# Patient Record
Sex: Female | Born: 1970
Health system: Southern US, Community
[De-identification: ages and names within clinical notes are randomized; demographics above are authoritative.]

## PROBLEM LIST (undated history)

## (undated) ENCOUNTER — Ambulatory Visit

## (undated) ENCOUNTER — Telehealth

## (undated) ENCOUNTER — Encounter: Attending: Gynecologic Oncology | Primary: Gynecologic Oncology

## (undated) ENCOUNTER — Encounter

## (undated) ENCOUNTER — Inpatient Hospital Stay

## (undated) ENCOUNTER — Encounter: Attending: Registered" | Primary: Registered"

## (undated) ENCOUNTER — Ambulatory Visit: Payer: PRIVATE HEALTH INSURANCE

## (undated) ENCOUNTER — Ambulatory Visit: Attending: Gynecologic Oncology | Primary: Gynecologic Oncology

## (undated) ENCOUNTER — Encounter
Attending: Student in an Organized Health Care Education/Training Program | Primary: Student in an Organized Health Care Education/Training Program

## (undated) DIAGNOSIS — M545 Low back pain, unspecified: Secondary | ICD-10-CM

## (undated) DIAGNOSIS — R112 Nausea with vomiting, unspecified: Secondary | ICD-10-CM

## (undated) DIAGNOSIS — Z87442 Personal history of urinary calculi: Secondary | ICD-10-CM

## (undated) DIAGNOSIS — K219 Gastro-esophageal reflux disease without esophagitis: Secondary | ICD-10-CM

## (undated) DIAGNOSIS — Z9889 Other specified postprocedural states: Secondary | ICD-10-CM

## (undated) DIAGNOSIS — O24419 Gestational diabetes mellitus in pregnancy, unspecified control: Secondary | ICD-10-CM

## (undated) DIAGNOSIS — I491 Atrial premature depolarization: Secondary | ICD-10-CM

## (undated) DIAGNOSIS — N200 Calculus of kidney: Secondary | ICD-10-CM

## (undated) DIAGNOSIS — M542 Cervicalgia: Secondary | ICD-10-CM

## (undated) DIAGNOSIS — M1990 Unspecified osteoarthritis, unspecified site: Secondary | ICD-10-CM

## (undated) DIAGNOSIS — Z803 Family history of malignant neoplasm of breast: Secondary | ICD-10-CM

## (undated) DIAGNOSIS — F411 Generalized anxiety disorder: Secondary | ICD-10-CM

## (undated) HISTORY — DX: Unspecified osteoarthritis, unspecified site: M19.90

## (undated) HISTORY — DX: Family history of malignant neoplasm of breast: Z80.3

## (undated) HISTORY — DX: Calculus of kidney: N20.0

## (undated) HISTORY — DX: Cervicalgia: M54.2

## (undated) HISTORY — DX: Low back pain, unspecified: M54.50

## (undated) HISTORY — PX: TONSILLECTOMY: SUR1361

## (undated) HISTORY — DX: Gastro-esophageal reflux disease without esophagitis: K21.9

## (undated) HISTORY — DX: Atrial premature depolarization: I49.1

## (undated) HISTORY — DX: Generalized anxiety disorder: F41.1

## (undated) HISTORY — DX: Gestational diabetes mellitus in pregnancy, unspecified control: O24.419

## (undated) MED FILL — Acetaminophen Tab 325 MG: ORAL | Qty: 2 | Status: AC

---
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
## 1998-04-08 ENCOUNTER — Emergency Department (HOSPITAL_COMMUNITY): Admission: EM | Admit: 1998-04-08 | Discharge: 1998-04-08 | Payer: Self-pay | Admitting: Internal Medicine

## 1999-05-05 ENCOUNTER — Other Ambulatory Visit: Admission: RE | Admit: 1999-05-05 | Discharge: 1999-05-05 | Payer: Self-pay | Admitting: Obstetrics and Gynecology

## 1999-10-22 ENCOUNTER — Emergency Department (HOSPITAL_COMMUNITY): Admission: EM | Admit: 1999-10-22 | Discharge: 1999-10-23 | Payer: Self-pay | Admitting: Emergency Medicine

## 1999-10-23 ENCOUNTER — Encounter: Payer: Self-pay | Admitting: Emergency Medicine

## 2000-05-05 ENCOUNTER — Other Ambulatory Visit: Admission: RE | Admit: 2000-05-05 | Discharge: 2000-05-05 | Payer: Self-pay | Admitting: Obstetrics and Gynecology

## 2001-02-21 ENCOUNTER — Encounter: Payer: Self-pay | Admitting: Obstetrics and Gynecology

## 2001-02-21 ENCOUNTER — Ambulatory Visit (HOSPITAL_COMMUNITY): Admission: RE | Admit: 2001-02-21 | Discharge: 2001-02-21 | Payer: Self-pay | Admitting: Obstetrics and Gynecology

## 2001-03-19 ENCOUNTER — Other Ambulatory Visit: Admission: RE | Admit: 2001-03-19 | Discharge: 2001-03-19 | Payer: Self-pay | Admitting: Obstetrics & Gynecology

## 2001-04-16 ENCOUNTER — Other Ambulatory Visit: Admission: RE | Admit: 2001-04-16 | Discharge: 2001-04-16 | Payer: Self-pay | Admitting: Obstetrics & Gynecology

## 2001-11-05 ENCOUNTER — Inpatient Hospital Stay (HOSPITAL_COMMUNITY): Admission: AD | Admit: 2001-11-05 | Discharge: 2001-11-07 | Payer: Self-pay | Admitting: Obstetrics and Gynecology

## 2001-12-17 ENCOUNTER — Other Ambulatory Visit: Admission: RE | Admit: 2001-12-17 | Discharge: 2001-12-17 | Payer: Self-pay | Admitting: Obstetrics and Gynecology

## 2002-12-31 ENCOUNTER — Ambulatory Visit (HOSPITAL_COMMUNITY): Admission: RE | Admit: 2002-12-31 | Discharge: 2002-12-31 | Payer: Self-pay | Admitting: Obstetrics and Gynecology

## 2003-01-14 ENCOUNTER — Other Ambulatory Visit: Admission: RE | Admit: 2003-01-14 | Discharge: 2003-01-14 | Payer: Self-pay | Admitting: Obstetrics and Gynecology

## 2003-10-21 ENCOUNTER — Inpatient Hospital Stay (HOSPITAL_COMMUNITY): Admission: AD | Admit: 2003-10-21 | Discharge: 2003-10-21 | Payer: Self-pay | Admitting: Obstetrics and Gynecology

## 2003-12-02 ENCOUNTER — Inpatient Hospital Stay (HOSPITAL_COMMUNITY): Admission: AD | Admit: 2003-12-02 | Discharge: 2003-12-04 | Payer: Self-pay | Admitting: Obstetrics and Gynecology

## 2005-09-14 ENCOUNTER — Ambulatory Visit (HOSPITAL_COMMUNITY): Admission: RE | Admit: 2005-09-14 | Discharge: 2005-09-14 | Payer: Self-pay | Admitting: *Deleted

## 2005-09-14 ENCOUNTER — Encounter: Payer: Self-pay | Admitting: *Deleted

## 2005-09-29 ENCOUNTER — Ambulatory Visit (HOSPITAL_COMMUNITY): Admission: RE | Admit: 2005-09-29 | Discharge: 2005-09-29 | Payer: Self-pay | Admitting: Gastroenterology

## 2005-10-13 ENCOUNTER — Inpatient Hospital Stay (HOSPITAL_COMMUNITY): Admission: AD | Admit: 2005-10-13 | Discharge: 2005-10-13 | Payer: Self-pay | Admitting: *Deleted

## 2005-10-14 ENCOUNTER — Observation Stay (HOSPITAL_COMMUNITY): Admission: AD | Admit: 2005-10-14 | Discharge: 2005-10-15 | Payer: Self-pay | Admitting: Obstetrics & Gynecology

## 2005-10-16 ENCOUNTER — Inpatient Hospital Stay (HOSPITAL_COMMUNITY): Admission: AD | Admit: 2005-10-16 | Discharge: 2005-10-16 | Payer: Self-pay | Admitting: Obstetrics & Gynecology

## 2005-10-19 ENCOUNTER — Inpatient Hospital Stay (HOSPITAL_COMMUNITY): Admission: AD | Admit: 2005-10-19 | Discharge: 2005-10-19 | Payer: Self-pay | Admitting: Obstetrics and Gynecology

## 2005-10-26 ENCOUNTER — Inpatient Hospital Stay (HOSPITAL_COMMUNITY): Admission: AD | Admit: 2005-10-26 | Discharge: 2005-10-26 | Payer: Self-pay | Admitting: Obstetrics and Gynecology

## 2005-11-02 ENCOUNTER — Inpatient Hospital Stay: Admission: AD | Admit: 2005-11-02 | Discharge: 2005-11-02 | Payer: Self-pay | Admitting: Obstetrics and Gynecology

## 2005-11-09 ENCOUNTER — Inpatient Hospital Stay (HOSPITAL_COMMUNITY): Admission: AD | Admit: 2005-11-09 | Discharge: 2005-11-09 | Payer: Self-pay | Admitting: Obstetrics and Gynecology

## 2006-02-23 ENCOUNTER — Ambulatory Visit (HOSPITAL_COMMUNITY): Admission: RE | Admit: 2006-02-23 | Discharge: 2006-02-23 | Payer: Self-pay | Admitting: Obstetrics and Gynecology

## 2006-03-06 ENCOUNTER — Ambulatory Visit (HOSPITAL_COMMUNITY): Admission: RE | Admit: 2006-03-06 | Discharge: 2006-03-06 | Payer: Self-pay | Admitting: Obstetrics and Gynecology

## 2006-03-06 ENCOUNTER — Encounter (INDEPENDENT_AMBULATORY_CARE_PROVIDER_SITE_OTHER): Payer: Self-pay | Admitting: Specialist

## 2006-09-14 ENCOUNTER — Ambulatory Visit (HOSPITAL_COMMUNITY): Admission: RE | Admit: 2006-09-14 | Discharge: 2006-09-14 | Payer: Self-pay | Admitting: *Deleted

## 2007-02-15 HISTORY — PX: SALPINGECTOMY: SHX328

## 2007-02-15 HISTORY — PX: TUBAL LIGATION: SHX77

## 2007-02-15 HISTORY — PX: ABDOMINAL EXPLORATION SURGERY: SHX538

## 2007-02-20 ENCOUNTER — Encounter: Admission: RE | Admit: 2007-02-20 | Discharge: 2007-02-20 | Payer: Self-pay | Admitting: Obstetrics and Gynecology

## 2007-04-13 ENCOUNTER — Inpatient Hospital Stay (HOSPITAL_COMMUNITY): Admission: AD | Admit: 2007-04-13 | Discharge: 2007-04-15 | Payer: Self-pay | Admitting: Obstetrics and Gynecology

## 2007-06-13 ENCOUNTER — Encounter (INDEPENDENT_AMBULATORY_CARE_PROVIDER_SITE_OTHER): Payer: Self-pay | Admitting: Obstetrics and Gynecology

## 2007-06-13 ENCOUNTER — Ambulatory Visit (HOSPITAL_COMMUNITY): Admission: RE | Admit: 2007-06-13 | Discharge: 2007-06-13 | Payer: Self-pay | Admitting: Obstetrics and Gynecology

## 2010-01-20 ENCOUNTER — Ambulatory Visit: Payer: Self-pay | Admitting: Family Medicine

## 2010-01-20 DIAGNOSIS — M928 Other specified juvenile osteochondrosis: Secondary | ICD-10-CM

## 2010-01-20 DIAGNOSIS — M549 Dorsalgia, unspecified: Secondary | ICD-10-CM

## 2010-01-20 DIAGNOSIS — M76899 Other specified enthesopathies of unspecified lower limb, excluding foot: Secondary | ICD-10-CM

## 2010-01-20 DIAGNOSIS — M999 Biomechanical lesion, unspecified: Secondary | ICD-10-CM | POA: Insufficient documentation

## 2010-03-18 NOTE — Assessment & Plan Note (Signed)
Summary: np/adjustment,df   Vital Signs:  Patient profile:   40 year old female Height:      60 inches Weight:      108.3 pounds BMI:     21.23 Temp:     98.5 degrees F oral Pulse rate:   108 / minute BP sitting:   114 / 73  (left arm) Cuff size:   regular  Vitals Entered By: Garen Grams LPN (January 20, 2010 11:06 AM) CC: New Patient Is Patient Diabetic? No Pain Assessment Patient in pain? yes     Location: lower back   Primary Care Provider:  Antoine Primas DO  CC:  New Patient.  History of Present Illness: 40 yo female here to establish care.  Pt will continue to go to her gynecologist for pap smears.   Pt is in good health and only has a couple complaints.   1.  Back pain-  Pt notices it at an end of a long day or if she works multiple days in a row.  Pt states some pain radiates down right leg, stays above knee mild numbness in foot sometimes, leg never gives out on her described as an ache in character.  Pt denies any trauma to area and the pain usually gets better. Denies any bowel or bladder problems.  Would be interested in OMT.   2.  hyperglycemia-  Pt states she has always been borderline diabetic since last pregnancy but was 112 fasting last time she had it check approx 1 month ago.   Habits & Providers  Alcohol-Tobacco-Diet     Tobacco Status: never  Current Medications (verified): 1)  Ibuprofen 600 Mg Tabs (Ibuprofen) .... Take 1 Tab By Mouth Two Times A Day For The Next 5 Days Then As Needed 2)  Cvs Cinnamon 500 Mg Caps (Cinnamon) 3)  Fish Oil 1000 Mg Caps (Omega-3 Fatty Acids) 4)  Vitamin D 400 Unit Caps (Cholecalciferol)  Allergies (verified): No Known Drug Allergies  Past History:  Past Medical History: kidney stone GDM with last pregnancy 2009 endometriosis.   Past Surgical History: Tubal ligation Tonsillectomy ectopic pregnancy resulted in right tube being removed.   Family History: Multiple individuals with heart disease MGM  mi Mom RA at 8 glaucoma  Social History: no smoke  occasional alcohol no illicits lives with husband and 3 kids will (8), Pensions consultant (6), and Ruby (2)Smoking Status:  never  Review of Systems       denies fever, chills, nausea, vomiting, diarrhea or constipation   Physical Exam  General:  Well-developed,well-nourished,in no acute distress; alert,appropriate and cooperative throughout examination, small stature but WNL Eyes:  No corneal or conjunctival inflammation noted. EOMI. Perrla. Mouth:  Oral mucosa and oropharynx without lesions or exudates.  Teeth in good repair. Lungs:  Normal respiratory effort, chest expands symmetrically. Lungs are clear to auscultation, no crackles or wheezes. Heart:  Normal rate and regular rhythm. S1 and S2 normal without gallop, murmur, click, rub or other extra sounds. Abdomen:  Bowel sounds positive,abdomen soft and non-tender without masses, organomegaly or hernias noted. Msk:  No deformity or scoliosis noted of thoracic or lumbar spine.   SLT neg Pearlean Brownie positive on right tender to palpation of greater trochanteric bursae on right no hip pain with internal rotation full ROM OMT findings T7RSright L2RS right Sacrum RS right tight piraformis  standing structuaral showed right shoulder down right hip up  After verbal consent OMT was done with improvement of all findings and improved standing structual  exam .  Pulses:  2+ Extremities:  no edema Neurologic:  CN 2-12 intact Skin:  no rash or suspicious lesion.  Psych:  flat affect.     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  will have pt be seen again in 3-6 weeks if doing well.  Orders: OMT 1-2 [facility] [phone])  Problem # 3:  TROCHANTERIC BURSITIS, RIGHT  (ICD-726.5) will give 5 day course of high dose motrin and see if get improvmement.   Problem # 4:  BACK PAIN, RIGHT (ICD-724.5) MSK in nature, faber shows likely sacro-illiac in nature, will continue to monitor, no imaging needed at this time, pt improved well with manipulation.  Her updated medication list for this problem includes:    Ibuprofen 600 Mg Tabs (Ibuprofen) .[name]... Take 1 tab by mouth two times a day for the next 5 days then as needed  Complete Medication List: 1)  Ibuprofen 600 Mg Tabs (Ibuprofen) .... Take 1 tab by mouth two times a day for the next 5 days then as needed 2)  Cvs Cinnamon 500 Mg Caps (Cinnamon) 3)  Fish Oil 1000 Mg Caps (Omega-3 fatty acids) 4)  Vitamin D 400 Unit Caps (Cholecalciferol) Prescriptions: IBUPROFEN 600 MG TABS (IBUPROFEN) take 1 tab by mouth two times a day for the next 5 days then as needed  #60 x 1   Entered and Authorized by:   Antoine Primas DO   Signed by:   Antoine Primas DO on 01/20/2010   Method used:   Electronically to        [id number] (retail)       [address]       [place], [place]  [address]       Ph: [phone]       Fax: [phone]   RxID:   [id number]    Orders Added: 1)  [facility]- New Level 3 [[address]203] 2)  OMT 1-2 [facility] [27253]

## 2010-03-19 ENCOUNTER — Encounter: Payer: Self-pay | Admitting: *Deleted

## 2010-03-31 ENCOUNTER — Encounter: Payer: Self-pay | Admitting: Family Medicine

## 2010-04-01 ENCOUNTER — Ambulatory Visit (INDEPENDENT_AMBULATORY_CARE_PROVIDER_SITE_OTHER): Payer: BC Managed Care – PPO | Admitting: Family Medicine

## 2010-04-01 ENCOUNTER — Encounter: Payer: Self-pay | Admitting: Family Medicine

## 2010-04-01 DIAGNOSIS — M999 Biomechanical lesion, unspecified: Secondary | ICD-10-CM

## 2010-04-01 DIAGNOSIS — S60559A Superficial foreign body of unspecified hand, initial encounter: Secondary | ICD-10-CM | POA: Insufficient documentation

## 2010-04-01 NOTE — Assessment & Plan Note (Signed)
After verbal consent HVLA on side improved increase ROM decrease pain.

## 2010-04-01 NOTE — Assessment & Plan Note (Signed)
Splinter in left hand under 2nd MTP, removed after verbal consent with toothed tweezers band aid applied, do not anticipate any side effects.

## 2010-04-01 NOTE — Progress Notes (Signed)
  Subjective:    Patient ID: [name], female    DOB: Dec 18, 1970, 40 y.o.   MRN: [id number]  HPI  Pt is here for manipulation doing well some pain in right upper back no injury no swelling still can do all activities of daily living, Pt would like it looked at   Left hand-  Was in garden, may have splinter would like assessment.  No fever, no chills, still do all activities just some pain  Review of Systems     Objective:   Physical Exam Left hand-  Base of 2nd MTP has mild area of erythema with callus formation. No streaking.     OMT Findings: Cervical:C2 rotated and side bent left, C4 rotated and side bent right  Thoracic T7 rotated and side bent right  Elevated right 2nd rib  Lumbar: L2 rotated and side bent right Sacrum:normal      Assessment & Plan:

## 2010-04-22 ENCOUNTER — Ambulatory Visit: Payer: BC Managed Care – PPO | Admitting: Family Medicine

## 2010-06-29 NOTE — Discharge Summary (Signed)
NAMERADIANCE, DEADY NO.:  [id number]   MEDICAL RECORD NO.:  [id number]          PATIENT TYPE:  INP   LOCATION:  9118                          [facility]ILITY:  [facility]   PHYSICIAN:  Lenoard Aden, M.D.DATE OF BIRTH:  [birth year]   DATE OF ADMISSION:  04/13/2007  DATE OF DISCHARGE:  04/15/2007                               DISCHARGE SUMMARY   ADMISSION DIAGNOSES:  Gestational diabetes, history of severe shoulder  dystocia.   DISCHARGE DIAGNOSES:  Gestational diabetes, history of severe shoulder  dystocia.   HOSPITAL COURSE:  The patient was admitted with well-controlled  gestational diabetes at 37 weeks.  Previously, has had 2 complicated  vaginal deliveries with significant shoulder dystocia noted at the time  of both deliveries.  Estimated fetal weight per most recent ultrasound  revealed estimated fetal weight similar to those of previous pregnancy  and the patient is at risk for significant recurrent shoulder dystocia.  She declines amniocentesis.  At this time, decision was made to proceed  with induction risks, benefits, or discussed small risk of fetal  prematurity are noted due to favorable cervix and term gestation in the  interest of preventing previous shoulder dystocia.  Decision was made to  proceed with induction.  The patient declined cesarean section as well.  Given these risks, the patient underwent uncomplicated induction of  labor, had an uncomplicated vaginal delivery, and was discharged to home  on day #1 with normal eating patterns, tolerated regular diet without  difficulty, ambulated without difficulty.  Hemoglobin/hematocrit within  normal limits.  She is to followup in the office in 4-6 weeks.   DISCHARGE MEDICATIONS:  Prenatal vitamins, iron, and over-the-counter  nonsteroidal anti-inflammatory medications.      Lenoard Aden, M.D.  Electronically Signed     RJT/MEDQ  D:  06/03/2007  T:  06/04/2007  Job:  [id number]

## 2010-06-29 NOTE — Op Note (Signed)
NAME:  [name], [name]                ACCOUNT NO.:  [id number]   MEDICAL RECORD NO.:  [id number]          PATIENT TYPE:  AMB   LOCATION:  SDC                           [facility]   PHYSICIAN:  Lenoard Aden, M.D.DATE OF BIRTH:  09/11/1970   DATE OF PROCEDURE:  06/13/2007  DATE OF DISCHARGE:                               OPERATIVE REPORT   PREOPERATIVE DIAGNOSES:  1. Desire for elective sterilization.  2. Right lower quadrant pain.   POSTOPERATIVE DIAGNOSES:  1. Desire for elective sterilization.  2. Right lower quadrant pain.  3. Pelvic endometriosis.   PROCEDURE:  1. Diagnostic laparoscopy.  2. Left tubal interruption with bipolar cautery.  3. Right salpingectomy.  4. Ablation of right ovarian fossa endometriosis.   SURGEON:  Lenoard Aden, MD   ANESTHESIA:  General.   ESTIMATED BLOOD LOSS:  Less than 50 mL.   COMPLICATIONS:  None.   DRAINS:  None.   COUNTS:  Correct.   DISPOSITION:  To recovery in good condition.   SPECIMEN:  Right fallopian tube to pathology.   BRIEF OPERATIVE NOTE:  Being apprised of the risks of anesthesia,  infection, bleeding, injury to abdominal organs, need for repair,  delayed versus immediate complications including bowel and bladder  injury with possible need for repair, inability to cure pelvic pain, the  patient was brought to the operating room and she was administered a  general anesthetic without complications, prepped and draped in the  usual sterile fashion, and fully catheterized until the bladder was  empty.  Hulka tenaculum was placed per vagina.  Infraumbilical incision  made with a scalpel after dilute Marcaine solution placed.  Veress  needle placed.  Opening pressure of -4 noted.  3.5 L CO2 insufflated  without difficulty.  Trocar placed atraumatically.  Pictures taken.  Normal liver, gallbladder bed, normal appendiceal area, normal left and  right tubes and ovaries are noted.  Endometriosis in the right  ovarian  fossa noted.  Normal anterior and posterior cul-de-sac.  Two 5 mm ports  were made, right and left mid lower quadrants under direct visualization  and stab incision.  Dilute Marcaine solution placed.  Left tube traced  out at the fimbriated end.  It is divided using the Gyrus in 3  contiguous portions.  The ampullary-isthmic portion of the tube were  cauterized to resistance of zero and the tube is divided.  Tubal lumens  were visualized.  There is some evidence of pelvic congestion along the  left lateral sidewall.  On the right, the right tube was traced out at  the fimbriated end.  The mesosalpinx was cauterized picking multiple  bites using the Gyrus down to the level of the uterine cornua at which  point it is interrupted and divided and removed in total through the  laparoscopic umbilical port.  At this time, there is endometriosis noted  in the right ovarian fossa which is cauterized using the Gyrus after  identifying the right ureter in the process.  Good hemostasis was noted.  CO2 was released.  Good hemostasis was assured.  Incision closed in  0  Vicryl and Dermabond.  Hulka tenaculum removed from the vagina.  The  patient tolerated this procedure well and was transferred to recovery in  good condition.      Lenoard Aden, M.D.  Electronically Signed     RJT/MEDQ  D:  06/13/2007  T:  06/14/2007  Job:  [id number]

## 2010-06-29 NOTE — H&P (Signed)
NAMESONNET, RIZOR NO.:  [id number]   MEDICAL RECORD NO.:  [id number]          PATIENT TYPE:  INP   LOCATION:  9141                          [facility]ILITY[facility]   PHYSICIAN:  Lenoard Aden, M.D.DATE OF BIRTH:  October 02, 1970   DATE OF ADMISSION:  04/13/2007  DATE OF DISCHARGE:                              HISTORY & PHYSICAL   PRIORITY ADMISSION HISTORY AND PHYSICAL   CHIEF COMPLAINT:  History of gestational diabetes, history of  significant shoulder dystocia, for induction at 37 weeks.   She is a 40 year old, white female, G6, P2-0-3-2, with a history of  vaginal delivery x2, a history of ectopic x1, and a history of SAB x2,  who presents for induction.  She has ALLERGIES TO LATEX and SEPTRA.  Medications are prenatal vitamins.  She has a history of laparoscopy for  endometriosis 2008, history of ectopic pregnancy 2007 and given  Methotrexate, history of ureterolithiasis, and history of  gastroesophageal reflux.  She is a nonsmoker and nondrinker.  Denies  domestic or physical violence.  She has a current history with this  pregnancy of gestational diabetes, which is diet controlled.  Family  history of heart disease, hypertension, thyroid disease, myocardial  infarction, lymphoma, and migraine.  She has an obstetric history as  previously noted.   PHYSICAL EXAMINATION:  GENERAL:  She is a well-developed, well-  nourished, white female in no acute distress.  HEENT:  Normal.  LUNGS:  Clear.  HEART:  Regular rhythm.  ABDOMEN:  Soft, gravid, and nontender, estimated fetal weight of 6.5 to  7 pounds.  PELVIC EXAM:  The cervix is 3 to 4 cm, 80%, vertex at minus 1.  EXTREMITIES:  No cords.  NEUROLOGIC EXAM:  Nonfocal.  SKIN:  Intact.   IMPRESSION:  1. A 37-week intrauterine pregnancy.  2. History of significant shoulder dystocia with estimated fetal      weight similar to previous pregnancies.  3. Gestational diabetes, diet controlled.   PLAN:  Admit,  Pitocin, epidural, anticipate cautious attempts at vaginal  delivery as patient declines a cesarean section delivery at this time.      Lenoard Aden, M.D.  Electronically Signed    RJT/MEDQ  D:  04/13/2007  T:  04/14/2007  Job:  [id number]

## 2010-07-02 NOTE — H&P (Signed)
NAME:  [name], [name] NO.:  [id number]   MEDICAL RECORD NO.:  [id number]          PATIENT TYPE:  MAT   LOCATION:  MATC                          [facility]   PHYSICIAN:  Gerri Spore B. Earlene Plater, M.D.  DATE OF BIRTH:  September 07, 1970   DATE OF ADMISSION:  10/14/2005  DATE OF DISCHARGE:                                HISTORY & PHYSICAL   CHIEF COMPLAINT:  Right lower quadrant pain.   HISTORY OF PRESENT ILLNESS:  A 40 year old white female with known ectopic  pregnancy, status post methotrexate on October 13, 2005, presented this  evening with about 2 hours of sudden right lower quadrant pain, mild  tenesmus, no shoulder pain.  She states the pain is severe.  She has had no  dizziness, orthostasis.  She has continued to have light spotting.  Ultrasound in the office yesterday confirmed ectopic pregnancy with a  quantitative HCG around 2,000 and ectopic diameter 2-to-3-cm with trace  fluid in the pelvis.   PAST MEDICAL HISTORY:  None.   PAST OBSTETRICAL HISTORY:  1. Vaginal delivery x2.  2. D&E for missed abortion.   MEDICATIONS:  Prenatal vitamins.   ALLERGIES:  SEPTRA.   REVIEW OF SYSTEMS:  Otherwise negative.   FAMILY HISTORY:  Noncontributory.   PHYSICAL EXAMINATION:  VITAL SIGNS:  Temperature 97.2, pulse 99,  respirations 24, blood pressure 134/86.  GENERAL:  The patient is alert and oriented and in a moderate amount of  discomfort but not in distress.  ABDOMEN:  Soft and does have moderate right lower quadrant tenderness but no  rebound or guarding.   Ultrasound reviewed in radiology real time shows a 2-to-3-cm right adnexal  mass consistent with ectopic pregnancy.  The right ovary is visualized  separately with a corpus luteum cyst noted.  There is only a trace amount of  free fluid noted in the pelvis.  Uterus and left ovary appear normal, other  than a simple cyst on the left.   LABORATORY:  Hemoglobin is stable at 13 from yesterday.   ASSESSMENT:   Ectopic pregnancy, status post methotrexate, October 13, 2005,  one day ago with increased pain.   Clinically, the patient does not show signs of rupture and ultrasound and  hemoglobin are also suggestive that rupture has not occurred.  Options  discussed of laparoscopy for definitive diagnosis/treatment versus  observation, pain control, and serial hemoglobin.  The patient prefers the  later.   PLAN:  1. We will place her in observation on GYN.  2. IV PCA and keep her NPO.  3. We will follow closely.      Gerri Spore B. Earlene Plater, M.D.  Electronically Signed     WBD/MEDQ  D:  10/14/2005  T:  10/14/2005  Job:  [id number]

## 2010-07-02 NOTE — Op Note (Signed)
NAMEFIONNA, [name]                ACCOUNT NO.:  [id number]   MEDICAL RECORD NO.:  [id number]          PATIENT TYPE:  AMB   LOCATION:  SDC                           [facility]   PHYSICIAN:  Lenoard Aden, M.D.DATE OF BIRTH:  11/05/1970   DATE OF PROCEDURE:  03/06/2006  DATE OF DISCHARGE:                               OPERATIVE REPORT   PREOPERATIVE DIAGNOSIS:  Pelvic pain, right lower quadrant pain, with  history of right ectopic.   POSTOPERATIVE DIAGNOSES:  1. Pelvic adhesions with right peritubal and periovarian adhesions,      adherence of the bowel mesentery to the left adnexa and left cul-de-      sac.  2. Right ovarian endometriosis.   PROCEDURES:  1. Diagnostic laparoscopy.  2. Lysis of adhesions.  3. Ablation of endometriosis.   SURGEON:  Lenoard Aden, M.D.   ASSISTANT:  Chester Holstein. Earlene Plater, M.D.   ANESTHESIA:  General.   ESTIMATED BLOOD LOSS:  Less than 50 mL.   COMPLICATIONS:  None.   DRAINS:  Foley.   COUNTS:  Correct.   Patient to recovery in good condition.   BRIEF OPERATIVE NOTE:  After being apprised of the risks of anesthesia,  infection, bleeding, injury to abdominal organs and need for repair,  delayed versus immediate complications to include bowel or bladder  injury, the patient was brought to the operating room, where she was  administered a general anesthetic without complications, prepped and  draped in the usual sterile fashion, a Foley catheter placed, a Hulka  tenaculum placed per vagina.  Infraumbilical incision made with a  scalpel, Veress needle placed, opening pressure of -2 noted.  A 10 mm  trocar placed without difficulty.  Atraumatic trocar entry visualized,  pictures taken.  Normal liver, gallbladder bed seen.  Normal appendiceal  area, normal uterus, normal anterior cul-de-sac and posterior cul-de-sac  with sigmoid mesentery adherent into the midportion of the left  posterior cul-de-sac, and there are minimal left  periovarian adhesions,  which are lysed sharply with Endoshears after placement of two 5 mm  trocar sites, one in the midline and one in the right lower quadrant,  atraumatically under transillumination.  At this time there are  extensive right peritubal and periovarian adhesions and the right ovary  is adhesed to the cul-de-sac, presumably due to some questionable  endometriosis which is noted upon the right ovary.  The ovarian ligament  is then grasped with an atraumatic grasper and Endoshears are used to  lyse adhesions, periovarian and up around the tubes.  Tubal fimbriae  appear normal.  These adhesions are completely lysed and the  endometriosis which appears in the right ovary, which is lysed using the  monopolar cautery with the Endoshears without difficulty.  Good  hemostasis is noted.  No active bleeding is noted.  The anterior and  posterior cul-de-sac now appear clean of any adhesions.  Pictures are  taken.  All instruments are removed under direct visualization, CO2 is  released.  The patient is awakened and transferred to recovery in good  condition.  Lenoard Aden, M.D.  Electronically Signed     RJT/MEDQ  D:  03/06/2006  T:  03/06/2006  Job:  [id number]

## 2010-07-02 NOTE — H&P (Signed)
[name], [name]                ACCOUNT NO.:  [id number]   MEDICAL RECORD NO.:  [id number]          PATIENT TYPE:  AMB   LOCATION:  SDC                           [facility]   PHYSICIAN:  Lenoard Aden, M.D.DATE OF BIRTH:  [birth year]   DATE OF ADMISSION:  03/05/2006  DATE OF DISCHARGE:                              HISTORY & PHYSICAL   CHIEF COMPLAINT:  Pelvic pain.   HISTORY:  She is a 40 year old white female -- G3, P2; with a history of  an ectopic.  She presents with an HSG post-ectopic, which is consistent  with peritubal periovarian right adhesions, with a history of right  lower quadrant pain.   ALLERGIES:  LATEX, SEPTRA.   MEDICATIONS:  None.   FAMILY HISTORY:  Heart disease, hypertension, hyperthyroidism and  diabetes.   SOCIAL HISTORY:  She is a nonsmoker, nondrinker.  She denies domestic or  physical violence.   PAST MEDICAL HISTORY:  1. History of esophageal reflux.  2. Two miscarriages.  3. Two spontaneous vaginal deliveries.  4. One ectopic treated by methotrexate.  Ectopic was conceived with      the [place] IUD.   PHYSICAL EXAMINATION:  GENERAL:  She is a well developed, well nourished  white female in no acute distress.  VITAL SIGNS:  Blood pressure 140/70, weight 112 pounds.  HEENT:  Normal.  LUNGS:  Clear.  HEART:  Regular rate and rhythm.  ABDOMEN:  Soft and nontender.  GU:  Uterus normal size, shape and contour.  No adnexal masses.  EXTREMITIES:  No cords.  NEUROLOGIC:  Nonfocal.   IMPRESSION:  History of ectopic with right lower quadrant pain and  questionable peritubal periovarian adhesions, by HSG.   PLAN:  Proceed with diagnostic laparoscopy, lysis of adhesions,  chromopertubation.  The risks of anesthesia, infection, bleeding, injury  to abdominal organs and need for repair was discussed.  Delay versus  immediate complications to include bowel and bladder injury noted and  inability to cure pelvic pain discussed.  The patient  acknowledges and  will proceed.      Lenoard Aden, M.D.  Electronically Signed     RJT/MEDQ  D:  03/05/2006  T:  03/05/2006  Job:  [id number]

## 2010-11-05 LAB — RPR: RPR Ser Ql: NONREACTIVE

## 2010-11-05 LAB — CBC
HCT: 36.1
Hemoglobin: 12.6
MCHC: 34.5
Platelets: 228
RBC: 3.85 — ABNORMAL LOW
WBC: 12 — ABNORMAL HIGH

## 2010-11-09 LAB — CBC
Hemoglobin: 14.4
MCHC: 34.6
MCV: 92.1
RBC: 4.51

## 2011-03-23 ENCOUNTER — Ambulatory Visit (INDEPENDENT_AMBULATORY_CARE_PROVIDER_SITE_OTHER): Payer: BC Managed Care – PPO | Admitting: Family Medicine

## 2011-03-23 ENCOUNTER — Encounter: Payer: Self-pay | Admitting: Family Medicine

## 2011-03-23 DIAGNOSIS — Z13220 Encounter for screening for lipoid disorders: Secondary | ICD-10-CM

## 2011-03-23 DIAGNOSIS — R109 Unspecified abdominal pain: Secondary | ICD-10-CM

## 2011-03-23 DIAGNOSIS — M549 Dorsalgia, unspecified: Secondary | ICD-10-CM

## 2011-03-23 LAB — CBC WITH DIFFERENTIAL/PLATELET
Basophils Absolute: 0 10*3/uL (ref 0.0–0.1)
HCT: 42.4 % (ref 36.0–46.0)
Hemoglobin: 14.3 g/dL (ref 12.0–15.0)
Lymphocytes Relative: 21 % (ref 12–46)
Monocytes Absolute: 0.9 10*3/uL (ref 0.1–1.0)
Monocytes Relative: 9 % (ref 3–12)
Neutro Abs: 7.5 10*3/uL (ref 1.7–7.7)
WBC: 10.7 10*3/uL — ABNORMAL HIGH (ref 4.0–10.5)

## 2011-03-23 LAB — COMPREHENSIVE METABOLIC PANEL
CO2: 24 mEq/L (ref 19–32)
Creat: 0.75 mg/dL (ref 0.50–1.10)
Glucose, Bld: 88 mg/dL (ref 70–99)
Total Bilirubin: 0.6 mg/dL (ref 0.3–1.2)

## 2011-03-23 LAB — LDL CHOLESTEROL, DIRECT: Direct LDL: 105 mg/dL — ABNORMAL HIGH

## 2011-03-23 MED ORDER — HYDROCODONE-ACETAMINOPHEN 5-500 MG PO TABS: 1.0000 | ORAL_TABLET | Freq: Three times a day (TID) | ORAL | Status: AC | PRN

## 2011-03-23 NOTE — Patient Instructions (Addendum)
It is so great to see you. We will get an ultrasound of your abdomen. I'm giving you some pain medicine to have on hand. If for some reason his fever unable to eat due to the pain and nausea or vomiting please contact me or go to the emergency room. If it does turn out to be your gallbladder I will send you to surgery. Gallbladder Disease Gallbladder disease (cholecystitis) is an inflammation of your gallbladder. It is usually caused by a build-up of stones (gallstones) or sludge (cholelithiasis) in your gallbladder. The gallbladder is not an essential organ. It is located slightly to the right of center in the belly (abdomen), behind the liver. It stores bile made in the liver. Bile aids in digestion of fats. Gallbladder disease may result in nausea (feeling sick to your stomach), abdominal pain, and jaundice. In severe cases, emergency surgery may be required. The most common type of gallbladder disease is gallstones. They begin as small crystals and slowly grow into stones. Gallstone pain occurs when the bile duct has spasms. The spasms are caused by the stone passing out of the duct. The stone is trying to pass at the same time bile is passing into the small bowel for digestion. The pain usually begins suddenly. It may persist from several minutes to several hours. Infection can occur. Infection can add to discomfort and severity of an acute attack. The pain may be made worse by breathing deeply or by being jarred. There may be fever and tenderness to the touch. In some cases, when gallstones do not move into the bile duct, people have no pain or symptoms. These are called "silent" gallstones. Women are three times more likely to develop gallstones than men. Women who have had several pregnancies are more likely to have gallbladder disease. Physicians sometimes advise removing diseased gallbladders before future pregnancies. Other factors that increase the risk of gallbladder disease are obesity, diets  heavy in fried foods and dairy products, increasing age, prolonged use of medications containing female hormones, and heredity. HOME CARE INSTRUCTIONS   If your physician prescribed an antibiotic, take as directed.   Only take over-the-counter or prescription medicines for pain, discomfort, or fever as directed by your caregiver.   Follow a low fat diet until seen again. (Fat causes the gallbladder to contract.)   Follow-up as instructed. Attacks are almost always recurrent and surgery is usually required for permanent treatment.  SEEK IMMEDIATE MEDICAL CARE IF:   Pain is increasing and not controlled by medications.   The pain moves to another part of your abdomen or to your back. (Right sided pain can be appendicitis and left sided pain in adults can be diverticulitis).   You have a fever.   You develop nausea and vomiting.  Document Released: 01/31/2005 Document Revised: 10/13/2010 Document Reviewed: 09/20/2007 [facility] Patient Information 2012 [place], [place].

## 2011-03-23 NOTE — Progress Notes (Signed)
  Subjective:    Patient ID: [name], female    DOB: 1970/06/30, 41 y.o.   MRN: [id number]  HPI 41 year old female coming in with right upper quadrant abdominal pain/rib pain. Patient has had this now for about 4 days. Patient states that he got bad on Friday worse on Saturday orbital and Sunday better on Monday patient did notice that she had some association with food soma nausea associated with it no injury does hurt with certain movements especially when she bends forward in flexion. Denies any fever chills diarrhea or constipation patient has a history of cholestasis while she was pregnant. No history of gallstones.   Review of Systems As stated above    Objective:   Physical Exam Vitals reviewed General: No apparent distress Cardiovascular: Regular in rhythm no murmur Pulmonary: Clear to auscultation bilaterally Abdomen bowel sounds positive in all 4 quadrants patient does have significant tenderness in the right upper quadrant patient has positive Murphy sign    Assessment & Plan:

## 2011-03-23 NOTE — Assessment & Plan Note (Signed)
Patient clinically fits the potential for gallstones. At this time will get a right upper quadrant ultrasound to look for any signs of gallstone. If positive we'll then send to surgery for evaluation. The patient red flags as well as some instructions on when to seek medical attention as well as changes in her diet that she can make during this time. Patient given a prescription for Vicodin for when pain becomes intolerable.

## 2011-03-24 ENCOUNTER — Ambulatory Visit (HOSPITAL_COMMUNITY)
Admission: RE | Admit: 2011-03-24 | Discharge: 2011-03-24 | Disposition: A | Discharge status: Home / Self Care | Payer: BC Managed Care – PPO | Source: Ambulatory Visit | Attending: Family Medicine | Admitting: Family Medicine

## 2011-03-24 DIAGNOSIS — R109 Unspecified abdominal pain: Secondary | ICD-10-CM

## 2011-03-24 DIAGNOSIS — R1011 Right upper quadrant pain: Secondary | ICD-10-CM | POA: Insufficient documentation

## 2011-04-13 ENCOUNTER — Encounter (HOSPITAL_COMMUNITY): Payer: Self-pay | Admitting: *Deleted

## 2011-04-13 ENCOUNTER — Emergency Department (HOSPITAL_COMMUNITY)
Admission: EM | Admit: 2011-04-13 | Discharge: 2011-04-13 | Disposition: A | Discharge status: Home / Self Care | Payer: No Typology Code available for payment source | Attending: Emergency Medicine | Admitting: Emergency Medicine

## 2011-04-13 DIAGNOSIS — L539 Erythematous condition, unspecified: Secondary | ICD-10-CM | POA: Insufficient documentation

## 2011-04-13 DIAGNOSIS — T1490XA Injury, unspecified, initial encounter: Secondary | ICD-10-CM | POA: Insufficient documentation
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
## 2011-04-13 NOTE — ED Notes (Signed)
Pt d/c in NAD.Pt ambulated with quick steady gait. No neuro deficits noted. Pt taken to where family member was admitted in ED.

## 2011-04-20 ENCOUNTER — Ambulatory Visit (INDEPENDENT_AMBULATORY_CARE_PROVIDER_SITE_OTHER): Payer: No Typology Code available for payment source | Admitting: Family Medicine

## 2011-04-20 ENCOUNTER — Encounter: Payer: Self-pay | Admitting: Family Medicine

## 2011-04-20 VITALS — BP 119/81 | HR 81 | Temp 97.6°F | Ht 59.0 in | Wt 109.0 lb

## 2011-04-20 DIAGNOSIS — M999 Biomechanical lesion, unspecified: Secondary | ICD-10-CM

## 2011-04-20 DIAGNOSIS — F418 Other specified anxiety disorders: Secondary | ICD-10-CM | POA: Insufficient documentation

## 2011-04-20 DIAGNOSIS — F411 Generalized anxiety disorder: Secondary | ICD-10-CM

## 2011-04-20 DIAGNOSIS — M539 Dorsopathy, unspecified: Secondary | ICD-10-CM

## 2011-04-20 DIAGNOSIS — M62830 Muscle spasm of back: Secondary | ICD-10-CM

## 2011-04-20 DIAGNOSIS — M5380 Other specified dorsopathies, site unspecified: Secondary | ICD-10-CM

## 2011-04-20 MED ORDER — HYDROXYZINE HCL 25 MG PO TABS: 25.0000 mg | ORAL_TABLET | Freq: Three times a day (TID) | ORAL | Status: DC | PRN

## 2011-04-20 MED ORDER — KETOROLAC TROMETHAMINE 60 MG/2ML IM SOLN
60.0000 mg | Freq: Once | INTRAMUSCULAR | Status: AC
  Administered 2011-04-20: 60 mg via INTRAMUSCULAR

## 2011-04-20 NOTE — Patient Instructions (Signed)
It is good to see you. I'm giving you some hydroxyzine to try for her anxiety. Please try it at home first cousin might make you sleepy You may want to come back in 3 weeks for another manipulation. If you need anything else please do not hesitate to call or come back in.
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
## 2011-04-20 NOTE — Assessment & Plan Note (Signed)
Patient likely has some posttraumatic stress. At this point though we will treat with hydroxyzine been admitted to the give patient a think that could be a dictator cuts tolerance. This is likely only going to be in the short duration. We'll try hydroxyzine 3 times a day as needed discussed the need for anything else with patient having multiple things that occurred to her over the course of last year history of her depression patient declined taking a PHQ 9 at this time. Will discuss again when patient follows up in 3 weeks.
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
## 2011-04-20 NOTE — Progress Notes (Signed)
Addended by: Deno Etienne on: 04/20/2011 05:59 PM   Modules accepted: Orders

## 2011-05-16 ENCOUNTER — Ambulatory Visit (INDEPENDENT_AMBULATORY_CARE_PROVIDER_SITE_OTHER): Payer: BC Managed Care – PPO | Admitting: Family Medicine

## 2011-05-16 ENCOUNTER — Encounter: Payer: Self-pay | Admitting: Family Medicine

## 2011-05-16 VITALS — BP 93/65 | HR 90 | Temp 97.8°F | Ht 59.0 in | Wt 108.0 lb

## 2011-05-16 DIAGNOSIS — M999 Biomechanical lesion, unspecified: Secondary | ICD-10-CM

## 2011-05-16 DIAGNOSIS — M549 Dorsalgia, unspecified: Secondary | ICD-10-CM
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
## 2011-05-16 NOTE — Patient Instructions (Signed)
Patient given verbal instructions 

## 2011-05-16 NOTE — Assessment & Plan Note (Signed)
Decision today to treat with OMT was based on Physical Exam  After verbal consent patient was treated with HVLA and muscle energy and articulate techniques in lumbar and cervical areas  Patient tolerated the procedure well with improvement in symptoms  Patient given exercises, stretches and lifestyle modifications  See medications in patient instructions if given  Patient will follow up in 6 weeks

## 2011-05-16 NOTE — Assessment & Plan Note (Signed)
Patient responded very well to manipulation therapy. Continue home exercise program anti-inflammatories as needed.

## 2013-03-18 ENCOUNTER — Other Ambulatory Visit: Payer: Self-pay | Admitting: Obstetrics and Gynecology

## 2013-03-20 ENCOUNTER — Ambulatory Visit (INDEPENDENT_AMBULATORY_CARE_PROVIDER_SITE_OTHER): Payer: BC Managed Care – PPO | Admitting: Physician Assistant

## 2013-03-20 VITALS — BP 116/68 | HR 101 | Temp 98.5°F | Resp 17 | Ht 60.5 in | Wt 117.0 lb

## 2013-03-20 DIAGNOSIS — D72829 Elevated white blood cell count, unspecified: Secondary | ICD-10-CM

## 2013-03-20 DIAGNOSIS — T2200XA Burn of unspecified degree of shoulder and upper limb, except wrist and hand, unspecified site, initial encounter: Secondary | ICD-10-CM

## 2013-03-20 DIAGNOSIS — M25529 Pain in unspecified elbow: Secondary | ICD-10-CM

## 2013-03-20 LAB — POCT CBC
GRANULOCYTE PERCENT: 76.2 % (ref 37–80)
HCT, POC: 46.6 % (ref 37.7–47.9)
HEMOGLOBIN: 14.5 g/dL (ref 12.2–16.2)
Lymph, poc: 2.5 (ref 0.6–3.4)
MCH, POC: 31 pg (ref 27–31.2)
MCHC: 31.1 g/dL — AB (ref 31.8–35.4)
MCV: 99.5 fL — AB (ref 80–97)
MID (CBC): 0.5 (ref 0–0.9)
MPV: 8.7 fL (ref 0–99.8)
PLATELET COUNT, POC: 359 10*3/uL (ref 142–424)
POC Granulocyte: 9.6 — AB (ref 2–6.9)
POC LYMPH PERCENT: 19.7 %L (ref 10–50)
POC MID %: 4.1 %M (ref 0–12)
RBC: 4.68 M/uL (ref 4.04–5.48)
RDW, POC: 12.8 %
WBC: 12.6 10*3/uL — AB (ref 4.6–10.2)

## 2013-03-20 MED ORDER — MUPIROCIN 2 % EX OINT: 1.0000 "application " | TOPICAL_OINTMENT | Freq: Two times a day (BID) | CUTANEOUS | Status: DC

## 2013-03-20 MED ORDER — TRAMADOL HCL 50 MG PO TABS: 50.0000 mg | ORAL_TABLET | Freq: Three times a day (TID) | ORAL | Status: DC | PRN
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
## 2013-11-21 ENCOUNTER — Encounter: Payer: Self-pay | Admitting: Family Medicine

## 2013-11-21 ENCOUNTER — Ambulatory Visit (INDEPENDENT_AMBULATORY_CARE_PROVIDER_SITE_OTHER): Payer: BC Managed Care – PPO | Admitting: Family Medicine

## 2013-11-21 ENCOUNTER — Ambulatory Visit (INDEPENDENT_AMBULATORY_CARE_PROVIDER_SITE_OTHER)
Admission: RE | Admit: 2013-11-21 | Discharge: 2013-11-21 | Disposition: A | Discharge status: Home / Self Care | Payer: BC Managed Care – PPO | Source: Ambulatory Visit | Attending: Family Medicine | Admitting: Family Medicine

## 2013-11-21 VITALS — BP 126/78 | HR 91 | Ht 59.0 in | Wt 114.0 lb

## 2013-11-21 DIAGNOSIS — M9904 Segmental and somatic dysfunction of sacral region: Secondary | ICD-10-CM

## 2013-11-21 DIAGNOSIS — M9903 Segmental and somatic dysfunction of lumbar region: Secondary | ICD-10-CM

## 2013-11-21 DIAGNOSIS — M5441 Lumbago with sciatica, right side: Secondary | ICD-10-CM

## 2013-11-21 DIAGNOSIS — M9902 Segmental and somatic dysfunction of thoracic region: Secondary | ICD-10-CM

## 2013-11-21 DIAGNOSIS — M545 Low back pain, unspecified: Secondary | ICD-10-CM | POA: Insufficient documentation

## 2013-11-21 DIAGNOSIS — M5416 Radiculopathy, lumbar region: Secondary | ICD-10-CM

## 2013-11-21 DIAGNOSIS — M999 Biomechanical lesion, unspecified: Secondary | ICD-10-CM

## 2013-11-21 MED ORDER — MELOXICAM 15 MG PO TABS: 15.0000 mg | ORAL_TABLET | Freq: Every day | ORAL | Status: DC

## 2013-11-21 MED ORDER — GABAPENTIN 100 MG PO CAPS: 100.0000 mg | ORAL_CAPSULE | Freq: Three times a day (TID) | ORAL | Status: DC

## 2013-11-21 NOTE — Progress Notes (Signed)
  [name] [place],  [address] Phone: [phone] Subjective:     CC: Low back pain  [id number] [name] is a 43 y.o. female coming in with complaint of low back pain. Patient states that this has been more of an insidious onset over the course of time. Patient states it seems to be mostly located on the right side. Patient states unfortunately she does have some intermittent him is going down the leg mostly in the posterior aspect. Patient states he can pass the knee. Denies though any weakness. Patient states that the severity is 6/10. 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 Patient is having a significant amount of pain that is causing her to not sleep at night.   X-rays were ordered, reviewed and interpreted by me today. Patient's x-rays show minimal facet arthropathy mostly over L4-L5 and L5-S1. Otherwise remarkably normal.    Past medical history, social, surgical and family history all reviewed in electronic medical record.   Review of Systems: No headache, visual changes, nausea, vomiting, diarrhea, constipation, dizziness, abdominal pain, skin rash, fevers, chills, night sweats, weight loss, swollen lymph nodes, body aches, joint swelling, muscle aches, chest pain, shortness of breath, mood changes.   Objective Blood pressure 126/78, pulse 91, height 4\' 11"  (1.499 m), weight 114 lb (51.71 kg), last menstrual period 11/14/2013, SpO2 97.00%.  General: No apparent distress alert and oriented x3 mood and affect normal, dressed appropriately.  HEENT: Pupils equal, extraocular movements intact  Respiratory: Patient's speak in full sentences and does not appear short of breath  Cardiovascular: No lower extremity edema, non tender, no erythema  Skin: Warm dry intact with no signs of infection or rash on extremities or on axial skeleton.  Abdomen: Soft nontender  Neuro:  Cranial nerves II through XII are intact, neurovascularly intact in all extremities with 2+ DTRs and 2+ pulses.  Lymph: No lymphadenopathy of posterior or anterior cervical chain or axillae bilaterally.  Gait normal with good balance and coordination.  MSK:  Non tender with full range of motion and good stability and symmetric strength and tone of shoulders, elbows, wrist, hip, knee and ankles bilaterally.  Back Exam:  Inspection: Unremarkable  Motion: Flexion 45 deg, Extension 25 deg, Side Bending to 45 deg bilaterally,  Rotation to 45 deg bilaterally  SLR laying: Negative  XSLR laying: Negative  Palpable tenderness: Mild positive straight leg test on the right FABER: Mild positive right  tenderness over the piriformis Sensory change: Gross sensation intact to all lumbar and sacral dermatomes.  Reflexes: 2+ at both patellar tendons, 2+ at achilles tendons, Babinski's downgoing.  Strength at foot  Plantar-flexion: 5/5 Dorsi-flexion: 5/5 Eversion: 5/5 Inversion: 5/5  Leg strength  Quad: 5/5 Hamstring: 5/5 Hip flexor: 5/5 Hip abductors: 5/5  Gait unremarkable.  OMT Physical Exam   Standing flexion right  Seated Flexion right  Cervical  C2 flexed rotated and side bent right  Thoracic T3 extended rotated and side bent right  Lumbar L2 flexed rotated and side bent right  Sacrum Left on left Illium       Impression and Recommendations:     This case required medical decision making of moderate complexity.

## 2013-11-21 NOTE — Patient Instructions (Signed)
Good to see you Meloixcam daily for 10 days then as needed Gabapentin 100mg  at night first week then 200mg  nightly next week and 300mg  thereafter.  Pennsaid up to twice daily in the area.  Exercises I am giving you 3 times a week Exercises on wall.  Heel and butt touching.  Raise leg 6 inches and hold 2 seconds.  Down slow for count of 4 seconds.  1 set of 30 reps daily on both sides.  Sitting long time tennis ball in back right pocket. Give me update Monday or Tuesday and I will see you again in 2 weeks.

## 2013-11-21 NOTE — Assessment & Plan Note (Signed)
Decision today to treat with OMT was based on Physical Exam  After verbal consent patient was treated with HVLA and muscle energy and articulate techniques in lumbar and cervical areas  Patient tolerated the procedure well with improvement in symptoms  Patient given exercises, stretches and lifestyle modifications  See medications in patient instructions if given  Patient will follow up in 2 weeks

## 2013-11-21 NOTE — Assessment & Plan Note (Signed)
Patient is having low back pain and I think this is consistent with her radicular symptoms. We will get x-rays today did not show any significant bony abnormality. Patient is having some signs of a piriformis syndrome and was given exercises for this. Patient did respond well to osteopathic manipulation. Patient because she is having difficulty with sleep will start on gabapentin given meloxicam to take daily scheduled for the next 10 days. Patient will try these interventions as well as an icing protocol and come back again in 2 weeks for further evaluation. Patient is to call earlier if any signs of weakness occurs.

## 2013-11-26 ENCOUNTER — Telehealth: Payer: Self-pay | Admitting: Family Medicine

## 2013-11-26 NOTE — Telephone Encounter (Signed)
Told patient to stay at 100mg  and if still difficulty then discontinue.

## 2013-11-26 NOTE — Telephone Encounter (Signed)
Spoke to pt, she states that she is doing about 75% better. She stated at the last visit it was discussed for her to increase her gabapentin from 100mg  to 200mg  & then to 300mg . She is currently still on the 100mg  & said that it has made her feel "loopy" & wanted to know how important it was to increase the dosage.

## 2013-11-26 NOTE — Telephone Encounter (Signed)
Patient called to inform you that the meds you prescribed seems to be working some but she does want to talk to you about increasing the dosage.

## 2013-12-13 ENCOUNTER — Other Ambulatory Visit (INDEPENDENT_AMBULATORY_CARE_PROVIDER_SITE_OTHER): Payer: BC Managed Care – PPO

## 2013-12-13 ENCOUNTER — Ambulatory Visit (INDEPENDENT_AMBULATORY_CARE_PROVIDER_SITE_OTHER): Payer: BC Managed Care – PPO | Admitting: Family Medicine

## 2013-12-13 ENCOUNTER — Encounter: Payer: Self-pay | Admitting: Family Medicine

## 2013-12-13 VITALS — BP 110/78 | HR 77 | Wt 115.0 lb

## 2013-12-13 DIAGNOSIS — M25551 Pain in right hip: Secondary | ICD-10-CM

## 2013-12-13 DIAGNOSIS — M999 Biomechanical lesion, unspecified: Secondary | ICD-10-CM

## 2013-12-13 DIAGNOSIS — M9903 Segmental and somatic dysfunction of lumbar region: Secondary | ICD-10-CM

## 2013-12-13 DIAGNOSIS — M76891 Other specified enthesopathies of right lower limb, excluding foot: Secondary | ICD-10-CM

## 2013-12-13 NOTE — Progress Notes (Signed)
[name] [place], [place] [address] Phone: [phone] Subjective:     CC: Low back pain  [id number] [name] is a 43 y.o. female coming in with complaint of low back pain. Patient states that she is feeling much better. Patient states that the pain is not localized over the lateral aspect of the hip. Patient states that the pain is worse when she lays on that side. Patient describes it as more of a dull aching sensation. States that he can radiate down the lateral aspect of her leg towards her knee. Patient states that overall though her back has felt significantly better. Patient is taken any over-the-counter medications as prescribed. States that the pain on the lateral aspect of the hip can wake her up at night.   X-rays were ordered, reviewed and interpreted by me today. Patient's x-rays show minimal facet arthropathy mostly over L4-L5 and L5-S1. Otherwise remarkably normal.    Past medical history, social, surgical and family history all reviewed in electronic medical record.   Review of Systems: No headache, visual changes, nausea, vomiting, diarrhea, constipation, dizziness, abdominal pain, skin rash, fevers, chills, night sweats, weight loss, swollen lymph nodes, body aches, joint swelling, muscle aches, chest pain, shortness of breath, mood changes.   Objective Blood pressure 110/78, pulse 77, weight 115 lb (52.164 kg), last menstrual period 11/14/2013, SpO2 97.00%.  General: No apparent distress alert and oriented x3 mood and affect normal, dressed appropriately.  HEENT: Pupils equal, extraocular movements intact  Respiratory: Patient's speak in full sentences and does not appear short of breath  Cardiovascular: No lower extremity edema, non tender, no erythema  Skin: Warm dry intact with no signs of infection or rash on extremities or on axial skeleton.  Abdomen: Soft nontender  Neuro: Cranial nerves II through XII are  intact, neurovascularly intact in all extremities with 2+ DTRs and 2+ pulses.  Lymph: No lymphadenopathy of posterior or anterior cervical chain or axillae bilaterally.  Gait normal with good balance and coordination.  MSK:  Non tender with full range of motion and good stability and symmetric strength and tone of shoulders, elbows, wrist, hip, knee and ankles bilaterally.  Back Exam:  Inspection: Unremarkable  Motion: Flexion 45 deg, Extension 25 deg, Side Bending to 45 deg bilaterally,  Rotation to 45 deg bilaterally  SLR laying: Negative  XSLR laying: Negative  Palpable tenderness: Mild positive straight leg test on the right FABER: Mild positive right  tenderness over the piriformis as well as tenderness over the greater trochanteric bursa Sensory change: Gross sensation intact to all lumbar and sacral dermatomes.  Reflexes: 2+ at both patellar tendons, 2+ at achilles tendons, Babinski's downgoing.  Strength at foot  Plantar-flexion: 5/5 Dorsi-flexion: 5/5 Eversion: 5/5 Inversion: 5/5  Leg strength  Quad: 5/5 Hamstring: 5/5 Hip flexor: 5/5 Hip abductors: 4/5  Gait unremarkable.  OMT Physical Exam   Standing flexion right  Seated Flexion right  Cervical  C2 flexed rotated and side bent right  Thoracic T3 extended rotated and side bent right  Lumbar L2 flexed rotated and side bent right  Sacrum Left on left Illium   MSK US performed of: Right This study was ordered, performed, and interpreted by Charlann Boxer D.O.  Hip: Trochanteric bursa with significant hypoechoic changes and swelling Acetabular labrum visualized and without tears, displacement, or effusion in joint. Femoral neck appears unremarkable without increased power doppler signal along Cortex.  IMPRESSION:  Greater trochanter bursitis  Procedure: Real-time Ultrasound Guided Injection of right greater trochanteric bursitis secondary to patient's body habitus Device: GE Logiq E  Ultrasound guided  injection is preferred based studies that show increased duration, increased effect, greater accuracy, decreased procedural pain, increased response rate, and decreased cost with ultrasound guided versus blind injection.  Verbal informed consent obtained.  Time-out conducted.  Noted no overlying erythema, induration, or other signs of local infection.  Skin prepped in a sterile fashion.  Local anesthesia: Topical Ethyl chloride.  With sterile technique and under real time ultrasound guidance:  Greater trochanteric area was visualized and patient's bursa was noted. A 22-gauge 3 inch needle was inserted and 4 cc of 0.5% Marcaine and 1 cc of Kenalog 40 mg/dL was injected. Pictures taken Completed without difficulty  Pain immediately resolved suggesting accurate placement of the medication.  Advised to call if fevers/chills, erythema, induration, drainage, or persistent bleeding.  Images permanently stored and available for review in the ultrasound unit.  Impression: Technically successful ultrasound guided injection.      Impression and Recommendations:     This case required medical decision making of moderate complexity.

## 2013-12-13 NOTE — Assessment & Plan Note (Signed)
Decision today to treat with OMT was based on Physical Exam  After verbal consent patient was treated with HVLA and muscle energy and articulate techniques in lumbar and cervical areas  Patient tolerated the procedure well with improvement in symptoms  Patient given exercises, stretches and lifestyle modifications  See medications in patient instructions if given  Patient will follow up in 4 weeks

## 2013-12-13 NOTE — Assessment & Plan Note (Signed)
Patient was given an injection today. Patient tolerated the procedure very well. Patient had near complete resolution of pain almost immediately. Patient home exercises, icing protocol we discussed proper hip abductor strengthening exercises. Patient showed proper technique today. Patient will follow-up with me again in 3 weeks for further evaluation and treatment.

## 2013-12-13 NOTE — Patient Instructions (Signed)
Good to see you Ice 20 minutes 2 times daily.  New exercises for the hip 3 times a week.  Exercises on wall.  Heel and butt touching.  Raise leg 6 inches and hold 2 seconds.  Down slow for count of 4 seconds.  1 set of 30 reps daily on both sides.  No more stupid medicine.  See me again in 3-4 weeks.

## 2014-08-12 ENCOUNTER — Encounter: Payer: Self-pay | Admitting: Family Medicine

## 2015-02-18 DIAGNOSIS — F430 Acute stress reaction: Secondary | ICD-10-CM | POA: Diagnosis not present

## 2015-03-19 DIAGNOSIS — F430 Acute stress reaction: Secondary | ICD-10-CM | POA: Diagnosis not present

## 2015-04-01 DIAGNOSIS — F430 Acute stress reaction: Secondary | ICD-10-CM | POA: Diagnosis not present

## 2015-04-14 DIAGNOSIS — F430 Acute stress reaction: Secondary | ICD-10-CM | POA: Diagnosis not present

## 2015-04-20 DIAGNOSIS — H5213 Myopia, bilateral: Secondary | ICD-10-CM | POA: Diagnosis not present

## 2015-04-20 DIAGNOSIS — Z1231 Encounter for screening mammogram for malignant neoplasm of breast: Secondary | ICD-10-CM | POA: Diagnosis not present

## 2015-04-29 DIAGNOSIS — F430 Acute stress reaction: Secondary | ICD-10-CM | POA: Diagnosis not present

## 2015-05-12 DIAGNOSIS — H5711 Ocular pain, right eye: Secondary | ICD-10-CM | POA: Diagnosis not present

## 2015-05-12 DIAGNOSIS — F430 Acute stress reaction: Secondary | ICD-10-CM | POA: Diagnosis not present

## 2015-05-12 MED FILL — FLUOROMETHOLONE 0.1% DROPS: 0.1 | 23 days supply | Qty: 5 | Fill #0

## 2015-05-14 DIAGNOSIS — Z1151 Encounter for screening for human papillomavirus (HPV): Secondary | ICD-10-CM | POA: Diagnosis not present

## 2015-05-14 DIAGNOSIS — Z01419 Encounter for gynecological examination (general) (routine) without abnormal findings: Secondary | ICD-10-CM | POA: Diagnosis not present

## 2015-05-14 DIAGNOSIS — Z6822 Body mass index (BMI) 22.0-22.9, adult: Secondary | ICD-10-CM | POA: Diagnosis not present

## 2015-05-14 MED FILL — ALPRAZolam 0.5 MG TABS: 0.5 | 20 days supply | Qty: 20 | Fill #0

## 2015-05-27 DIAGNOSIS — F430 Acute stress reaction: Secondary | ICD-10-CM | POA: Diagnosis not present

## 2015-06-11 DIAGNOSIS — F430 Acute stress reaction: Secondary | ICD-10-CM | POA: Diagnosis not present

## 2015-06-24 DIAGNOSIS — F430 Acute stress reaction: Secondary | ICD-10-CM | POA: Diagnosis not present

## 2015-07-09 DIAGNOSIS — F430 Acute stress reaction: Secondary | ICD-10-CM | POA: Diagnosis not present

## 2015-07-22 DIAGNOSIS — F430 Acute stress reaction: Secondary | ICD-10-CM | POA: Diagnosis not present

## 2015-08-04 DIAGNOSIS — F430 Acute stress reaction: Secondary | ICD-10-CM | POA: Diagnosis not present

## 2015-08-26 DIAGNOSIS — F430 Acute stress reaction: Secondary | ICD-10-CM | POA: Diagnosis not present

## 2015-09-14 DIAGNOSIS — F430 Acute stress reaction: Secondary | ICD-10-CM | POA: Diagnosis not present

## 2015-10-12 DIAGNOSIS — F430 Acute stress reaction: Secondary | ICD-10-CM | POA: Diagnosis not present

## 2015-10-26 DIAGNOSIS — H6993 Unspecified Eustachian tube disorder, bilateral: Secondary | ICD-10-CM | POA: Diagnosis not present

## 2015-11-05 DIAGNOSIS — F411 Generalized anxiety disorder: Secondary | ICD-10-CM | POA: Diagnosis not present

## 2015-11-17 DIAGNOSIS — F411 Generalized anxiety disorder: Secondary | ICD-10-CM | POA: Diagnosis not present

## 2015-12-03 ENCOUNTER — Ambulatory Visit (INDEPENDENT_AMBULATORY_CARE_PROVIDER_SITE_OTHER): Payer: 59 | Admitting: Family Medicine

## 2015-12-03 VITALS — BP 116/74 | HR 113 | Temp 98.5°F | Resp 18 | Ht 59.0 in | Wt 114.0 lb

## 2015-12-03 DIAGNOSIS — R079 Chest pain, unspecified: Secondary | ICD-10-CM | POA: Diagnosis not present

## 2015-12-03 DIAGNOSIS — F418 Other specified anxiety disorders: Secondary | ICD-10-CM

## 2015-12-03 DIAGNOSIS — R002 Palpitations: Secondary | ICD-10-CM

## 2015-12-03 DIAGNOSIS — F411 Generalized anxiety disorder: Secondary | ICD-10-CM | POA: Diagnosis not present
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
## 2015-12-03 NOTE — Patient Instructions (Addendum)
Return for care if palpitation persists or accompany chest pain and shortness of breath.  EKG was normal.   Consider an SSRI for anxiety if more conservative means are unsuccessful.  Nice meeting you!  Molli Barrows, FNP-C   IF you received an x-ray today, you will receive an invoice from [facility]. Please contact [facility] at [phone] with questions or concerns regarding your invoice.   IF you received labwork today, you will receive an invoice from [employer]. Please contact [facility] at [phone] with questions or concerns regarding your invoice.   Our billing staff will not be able to assist you with questions regarding bills from these companies.  You will be contacted with the lab results as soon as they are available. The fastest way to get your results is to activate your My Chart account. Instructions are located on the last page of this paperwork. If you have not heard from [place] regarding the results in 2 weeks, please contact this office.    al Palpitations A palpitation is the feeling that your heartbeat is irregular. It may feel like your heart is fluttering or skipping a beat. It may also feel like your heart is beating faster than normal. This is usually not a serious problem. In some cases, you may need more medical tests. HOME CARE  Avoid:  Caffeine in coffee, tea, soft drinks, diet pills, and energy drinks.  Chocolate.  Alcohol.  Stop smoking if you smoke.  Reduce your stress and anxiety. Try:  A method that measures bodily functions so you can learn to control them (biofeedback).  Yoga.  Meditation.  Physical activity such as swimming, jogging, or walking.  Get plenty of rest and sleep. GET HELP IF:  Your fast or irregular heartbeat continues after 24 hours.  Your palpitations occur more often. GET HELP RIGHT AWAY IF:   You have chest pain.  You feel short of breath.  You have a very bad  headache.  You feel dizzy or pass out (faint). MAKE SURE YOU:   Understand these instructions.  Will watch your condition.  Will get help right away if you are not doing well or get worse.   This information is not intended to replace advice given to you by your health care provider. Make sure you discuss any questions you have with your health care provider.   Document Released: 11/10/2007 Document Revised: 02/21/2014 Document Reviewed: 04/01/2011 Elsevier Interactive Patient Education [employer].

## 2015-12-09 DIAGNOSIS — F419 Anxiety disorder, unspecified: Secondary | ICD-10-CM | POA: Diagnosis not present

## 2015-12-09 DIAGNOSIS — R1011 Right upper quadrant pain: Secondary | ICD-10-CM | POA: Diagnosis not present

## 2015-12-09 MED FILL — ESCITALOPRAM 10 MG TABLET: 10 | 30 days supply | Qty: 30 | Fill #0

## 2015-12-10 ENCOUNTER — Other Ambulatory Visit (HOSPITAL_COMMUNITY): Payer: Self-pay | Admitting: Family Medicine

## 2015-12-10 ENCOUNTER — Ambulatory Visit (HOSPITAL_COMMUNITY)
Admission: RE | Admit: 2015-12-10 | Discharge: 2015-12-10 | Disposition: A | Discharge status: Home / Self Care | Payer: 59 | Source: Ambulatory Visit | Attending: Family Medicine | Admitting: Family Medicine

## 2015-12-10 DIAGNOSIS — R1011 Right upper quadrant pain: Secondary | ICD-10-CM | POA: Insufficient documentation

## 2015-12-16 DIAGNOSIS — F410 Panic disorder [episodic paroxysmal anxiety] without agoraphobia: Secondary | ICD-10-CM | POA: Diagnosis not present

## 2015-12-30 DIAGNOSIS — F410 Panic disorder [episodic paroxysmal anxiety] without agoraphobia: Secondary | ICD-10-CM | POA: Diagnosis not present

## 2016-01-13 DIAGNOSIS — F410 Panic disorder [episodic paroxysmal anxiety] without agoraphobia: Secondary | ICD-10-CM | POA: Diagnosis not present

## 2016-01-21 DIAGNOSIS — F411 Generalized anxiety disorder: Secondary | ICD-10-CM | POA: Diagnosis not present

## 2016-01-21 MED FILL — ESCITALOPRAM 10 MG TABLET: 10 | 30 days supply | Qty: 30 | Fill #0

## 2016-01-28 DIAGNOSIS — F410 Panic disorder [episodic paroxysmal anxiety] without agoraphobia: Secondary | ICD-10-CM | POA: Diagnosis not present

## 2016-02-19 DIAGNOSIS — F410 Panic disorder [episodic paroxysmal anxiety] without agoraphobia: Secondary | ICD-10-CM | POA: Diagnosis not present

## 2016-03-16 DIAGNOSIS — F410 Panic disorder [episodic paroxysmal anxiety] without agoraphobia: Secondary | ICD-10-CM | POA: Diagnosis not present

## 2016-03-28 MED FILL — ESCITALOPRAM 10 MG TABLET: 10 | 30 days supply | Qty: 30 | Fill #1

## 2016-03-31 DIAGNOSIS — F410 Panic disorder [episodic paroxysmal anxiety] without agoraphobia: Secondary | ICD-10-CM | POA: Diagnosis not present

## 2016-05-31 MED FILL — ESCITALOPRAM 10 MG TABLET: 10 | 30 days supply | Qty: 30 | Fill #2

## 2016-07-01 DIAGNOSIS — R87610 Atypical squamous cells of undetermined significance on cytologic smear of cervix (ASC-US): Secondary | ICD-10-CM | POA: Diagnosis not present

## 2016-07-01 DIAGNOSIS — Z01419 Encounter for gynecological examination (general) (routine) without abnormal findings: Secondary | ICD-10-CM | POA: Diagnosis not present

## 2016-07-01 DIAGNOSIS — Z6823 Body mass index (BMI) 23.0-23.9, adult: Secondary | ICD-10-CM | POA: Diagnosis not present

## 2016-07-01 DIAGNOSIS — Z1231 Encounter for screening mammogram for malignant neoplasm of breast: Secondary | ICD-10-CM | POA: Diagnosis not present

## 2016-07-01 MED FILL — ALPRAZolam 0.5 MG TABS: 0.5 | 20 days supply | Qty: 20 | Fill #0

## 2016-07-14 NOTE — Progress Notes (Signed)
[name] [place], [place] [address] Phone: [phone] Subjective:      CC: Neck pain  [id number]  [name] is a 46 y.o. female coming in with complaint of Neck pain. Patient states that she has had this for some time. Seems to be mostly on the right sign. Worse after working long shifts. Patient denies any new injury. States that it can be severe. Can wake her up at night even. Denies any chest pain or any shortness of breath. Rates the severity pain is 6 out of 10. Does respond to anti-inflammatories but tries not to use them on a regular basis     Past Medical History:  Diagnosis Date  . Endometriosis   . Kidney stones    Past Surgical History:  Procedure Laterality Date  . ABDOMINAL EXPLORATION SURGERY    . TONSILLECTOMY    . TUBAL LIGATION     Social History   Social History  . Marital status: Married    Spouse name: N/A  . Number of children: 3  . Years of education: N/A   Occupational History  . 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 Smoking status: Never Smoker  . Smokeless tobacco: Never Used  . Alcohol use 2.5 oz/week    5 Standard drinks or equivalent per week  . Drug use: No  . Sexual activity: Yes    Partners: Male    Birth control/ protection: Surgical   Other Topics Concern  . 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 Sulfa Antibiotics Hives  . Septra [Bactrim] Rash   Family History  Problem Relation Age of Onset  . Coronary artery disease Unknown   . Rheum arthritis Mother   . Stroke Mother   . Hypertension Mother   . Heart disease Mother   . Cancer Mother   . Glaucoma Unknown   . Diabetes Father   . Heart disease Father   . Hyperlipidemia Father   . Hypertension Father   . Heart disease Maternal Grandmother     . Diabetes Paternal Grandmother   . Hypertension Paternal Grandmother   . Heart disease Paternal Grandfather   . Hyperlipidemia Paternal Grandfather     Past medical history, social, surgical and family history all reviewed in electronic medical record.  No pertanent information unless stated regarding to the chief complaint.   Review of Systems:Review of systems updated and as accurate as of 07/15/16  No headache, visual changes, nausea, vomiting, diarrhea, constipation, dizziness, abdominal pain, skin rash, fevers, chills, night sweats, weight loss, swollen lymph nodes, body aches, joint swelling,chest pain, shortness of breath, mood changes. Positive muscle aches  Objective  Blood pressure 100/70, pulse 75, weight 122 lb (55.3 kg), SpO2 98 %. Systems examined below as of 07/15/16   General: No apparent distress alert and oriented x3 mood and affect normal, dressed appropriately.  HEENT: Pupils equal, extraocular movements intact  Respiratory: Patient's speak in full sentences and does not appear short of breath  Cardiovascular: No lower extremity edema, non tender, no erythema  Skin: Warm dry intact with no signs of infection or rash on extremities or on axial skeleton.  Abdomen: Soft nontender  Neuro: Cranial nerves II through XII are intact, neurovascularly  intact in all extremities with 2+ DTRs and 2+ pulses.  Lymph: No lymphadenopathy of posterior or anterior cervical chain or axillae bilaterally.  Gait normal with good balance and coordination.  MSK:  Non tender with full range of motion and good stability and symmetric strength and tone of shoulders, elbows, wrist, hip, knee and ankles bilaterally.  Neck: Inspection unremarkable. No palpable stepoffs. Negative Spurling's maneuver. 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      Note: This dictation was prepared with Dragon dictation along with smaller phrase technology. Any transcriptional errors that result from this process are unintentional.

## 2016-07-15 ENCOUNTER — Encounter: Payer: Self-pay | Admitting: Family Medicine

## 2016-07-15 ENCOUNTER — Ambulatory Visit (INDEPENDENT_AMBULATORY_CARE_PROVIDER_SITE_OTHER): Payer: 59 | Admitting: Family Medicine

## 2016-07-15 ENCOUNTER — Ambulatory Visit: Payer: Self-pay

## 2016-07-15 VITALS — BP 100/70 | HR 75 | Wt 122.0 lb

## 2016-07-15 DIAGNOSIS — M999 Biomechanical lesion, unspecified: Secondary | ICD-10-CM | POA: Diagnosis not present

## 2016-07-15 DIAGNOSIS — M25511 Pain in right shoulder: Secondary | ICD-10-CM

## 2016-07-15 DIAGNOSIS — M542 Cervicalgia: Secondary | ICD-10-CM | POA: Diagnosis not present

## 2016-07-15 MED ORDER — HYDROXYZINE HCL 10 MG PO TABS: 10.0000 mg | ORAL_TABLET | Freq: Three times a day (TID) | ORAL | 0 refills | Status: DC | PRN

## 2016-07-15 MED ORDER — TIZANIDINE HCL 2 MG PO CAPS: 2.0000 mg | ORAL_CAPSULE | Freq: Every evening | ORAL | 1 refills | Status: DC | PRN

## 2016-07-15 NOTE — Assessment & Plan Note (Signed)
I believe that most patient's pain seems to be more musculoskeletal. Likely more muscle imbalances. Discussed with patient at great length. We discussed icing regimen. We discussed ergonomics. Proper lifting mechanics. Responded very well to osteopathic manipulation. Patient was given some gabapentin to take at night as well as meloxicam for breakthrough pain. Hydroxyzine for some intermittent pain. Follow-up again in 5-6 weeks.

## 2016-07-15 NOTE — Assessment & Plan Note (Signed)
Decision today to treat with OMT was based on Physical Exam  After verbal consent patient was treated with HVLA, ME, FPR techniques in cervical, thoracic, lumbar and sacral areas  Patient tolerated the procedure well with improvement in symptoms  Patient given exercises, stretches and lifestyle modifications  See medications in patient instructions if given  Patient will follow up in 5-6 weeks

## 2016-07-15 NOTE — Patient Instructions (Signed)
Makes my day seeing you! On wall with heels, butt shoulder and head touching for a goal of 5 minutes daily  Stay active but keep hands within peripheral vision.  If worsening pain zanaflex nightly  Duexis up to 3 times daily  Hydroxyzine 10mg  up to 3 times a day when needed See me again in 5 weeks.

## 2016-08-04 DIAGNOSIS — F410 Panic disorder [episodic paroxysmal anxiety] without agoraphobia: Secondary | ICD-10-CM | POA: Diagnosis not present

## 2016-08-18 ENCOUNTER — Ambulatory Visit: Payer: 59 | Admitting: Family Medicine

## 2016-08-19 ENCOUNTER — Encounter: Payer: Self-pay | Admitting: Family Medicine

## 2016-08-19 ENCOUNTER — Ambulatory Visit (INDEPENDENT_AMBULATORY_CARE_PROVIDER_SITE_OTHER): Payer: 59 | Admitting: Family Medicine

## 2016-08-19 VITALS — BP 112/72 | HR 65 | Ht 59.0 in | Wt 124.0 lb

## 2016-08-19 DIAGNOSIS — M542 Cervicalgia: Secondary | ICD-10-CM

## 2016-08-19 DIAGNOSIS — M999 Biomechanical lesion, unspecified: Secondary | ICD-10-CM

## 2016-08-19 NOTE — Assessment & Plan Note (Signed)
Decision today to treat with OMT was based on Physical Exam  After verbal consent patient was treated with HVLA, ME, FPR techniques in cervical, thoracic, lumbar and sacral areas  Patient tolerated the procedure well with improvement in symptoms  Patient given exercises, stretches and lifestyle modifications  See medications in patient instructions if given  Patient will follow up in 6 weeks 

## 2016-08-19 NOTE — Patient Instructions (Signed)
Great to see you! Posture is key

## 2016-08-19 NOTE — Assessment & Plan Note (Signed)
Overall doing better. Encourage her to continue to work on posture. patient does well can follow-up with me in 6 weeks.

## 2016-08-19 NOTE — Progress Notes (Signed)
[name] [place],  [address] Phone: [phone] Subjective:      CC: Neck pain f/u   [id number]  [name] is a 46 y.o. female coming in with complaint of Neck pain. Patient states that she has had this for some time. Seems to be mostly on the right Side. Patient denies any radiation down the arm. Seems to be staying localized. Does have association with stress. Muscle relaxer that was given to her previously has been very helpful..    Past Medical History:  Diagnosis Date  . Endometriosis   . Kidney stones    Past Surgical History:  Procedure Laterality Date  . ABDOMINAL EXPLORATION SURGERY    . TONSILLECTOMY    . TUBAL LIGATION     Social History   Social History  . Marital status: Married    Spouse name: N/A  . Number of children: 3  . Years of education: N/A   Occupational History  . 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 Smoking status: Never Smoker  . Smokeless tobacco: Never Used  . Alcohol use 2.5 oz/week    5 Standard drinks or equivalent per week  . Drug use: No  . Sexual activity: Yes    Partners: Male    Birth control/ protection: Surgical   Other Topics Concern  . 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 Sulfa Antibiotics Hives  . Septra [Bactrim] Rash   Family History  Problem Relation Age of Onset  . Coronary artery disease Unknown   . Rheum arthritis Mother   . Stroke Mother   . Hypertension Mother   . Heart disease Mother   . Cancer Mother   . Glaucoma Unknown   . Diabetes Father   . Heart disease Father   . Hyperlipidemia Father   . Hypertension Father   . Heart disease Maternal Grandmother   . Diabetes Paternal Grandmother   . Hypertension Paternal Grandmother   . Heart disease Paternal Grandfather     . Hyperlipidemia Paternal Grandfather     Past medical history, social, surgical and family history all reviewed in electronic medical record.  No pertanent information unless stated regarding to the chief complaint.   Review of Systems: No headache, visual changes, nausea, vomiting, diarrhea, constipation, dizziness, abdominal pain, skin rash, fevers, chills, night sweats, weight loss, swollen lymph nodes, body aches, joint swelling, muscle aches, chest pain, shortness of breath, mood changes.     Objective  Blood pressure 112/72, pulse 65, height 4\' 11"  (1.499 m), weight 124 lb (56.2 kg), SpO2 97 %.   Systems examined below as of 08/19/16 General: NAD A&O x3 mood, affect normal  HEENT: Pupils equal, extraocular movements intact no nystagmus Respiratory: not short of breath at rest or with speaking Cardiovascular: No lower extremity edema, non tender Skin: Warm dry intact with no signs of infection or rash on extremities or on axial skeleton. Abdomen: Soft nontender, no masses Neuro: Cranial nerves  intact, neurovascularly intact in all extremities with 2+ DTRs and 2+ pulses. Lymph: No lymphadenopathy appreciated today  Gait normal with good balance and coordination.  MSK: Non tender with full range of motion and good stability and symmetric strength and tone of shoulders, elbows,  wrist,  knee hips and ankles bilaterally.   Neck: Inspection unremarkable. No palpable stepoffs. Negative Spurling's maneuver. Full neck range of motion Grip strength and sensation normal in bilateral hands Strength good C4 to T1 distribution No sensory change to C4 to T1 Negative Hoffman sign bilaterally Reflexes normal  Osteopathic findings C2 flexed rotated and side bent right T3 extended rotated and side bent right inhaled third rib L5 flexed rotated and side bent right Sacrum right on right    Impression and Recommendations:     This case required medical decision making of moderate  complexity.      Note: This dictation was prepared with Dragon dictation along with smaller phrase technology. Any transcriptional errors that result from this process are unintentional.

## 2016-08-25 DIAGNOSIS — F410 Panic disorder [episodic paroxysmal anxiety] without agoraphobia: Secondary | ICD-10-CM | POA: Diagnosis not present

## 2016-09-14 DIAGNOSIS — F410 Panic disorder [episodic paroxysmal anxiety] without agoraphobia: Secondary | ICD-10-CM | POA: Diagnosis not present

## 2016-09-28 DIAGNOSIS — F410 Panic disorder [episodic paroxysmal anxiety] without agoraphobia: Secondary | ICD-10-CM | POA: Diagnosis not present

## 2016-09-30 ENCOUNTER — Ambulatory Visit (INDEPENDENT_AMBULATORY_CARE_PROVIDER_SITE_OTHER): Payer: 59 | Admitting: Family Medicine

## 2016-09-30 ENCOUNTER — Encounter: Payer: Self-pay | Admitting: Family Medicine

## 2016-09-30 VITALS — BP 92/62 | HR 66 | Wt 124.0 lb

## 2016-09-30 DIAGNOSIS — M5441 Lumbago with sciatica, right side: Secondary | ICD-10-CM | POA: Diagnosis not present

## 2016-09-30 DIAGNOSIS — G8929 Other chronic pain: Secondary | ICD-10-CM | POA: Diagnosis not present

## 2016-09-30 DIAGNOSIS — M999 Biomechanical lesion, unspecified: Secondary | ICD-10-CM | POA: Diagnosis not present

## 2016-09-30 NOTE — Assessment & Plan Note (Signed)
Overall doing relatively well. We discussed icing regimen, home exercises, ergonomics. We discussed which activities doing which ones to avoid

## 2016-09-30 NOTE — Patient Instructions (Signed)
Good to see you  [name] is your friend.  Stay active.  I am impressed See me again in 6-12 weeks.

## 2016-09-30 NOTE — Progress Notes (Signed)
[name] [place], [place] [address] Phone: [phone] Subjective:      CC: Back pain  [id number]  [name] is a 46 y.o. female coming in with complaint of back pain. We have seen patient on multiple occasions and has responded very well to osteopathic manipulation. Patient does do a lot of manual lifting with her job. Patient states that she has been doing relatively well overall. Some mild tightness more of the upper right side of the neck. Very intermittent radiation down the hand but only last seconds and seems to go away with change in position. Denies any weakness. Patient rates the severity of pain is 6 out of 10.      Past Medical History:  Diagnosis Date  . Endometriosis   . Kidney stones    Past Surgical History:  Procedure Laterality Date  . ABDOMINAL EXPLORATION SURGERY    . TONSILLECTOMY    . TUBAL LIGATION     Social History   Social History  . Marital status: Married    Spouse name: N/A  . Number of children: 3  . Years of education: N/A   Occupational History  . 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 Smoking status: Never Smoker  . Smokeless tobacco: Never Used  . Alcohol use 2.5 oz/week    5 Standard drinks or equivalent per week  . Drug use: No  . Sexual activity: Yes    Partners: Male    Birth control/ protection: Surgical   Other Topics Concern  . 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 Sulfa Antibiotics Hives  . Septra [Bactrim] Rash   Family History  Problem Relation Age of Onset  . Coronary artery disease Unknown   . Rheum arthritis Mother   . Stroke Mother   . Hypertension Mother   . Heart disease Mother   . Cancer Mother   . Glaucoma Unknown   . Diabetes Father   . Heart disease Father   .  Hyperlipidemia Father   . Hypertension Father   . Heart disease Maternal Grandmother   . Diabetes Paternal Grandmother   . Hypertension Paternal Grandmother   . Heart disease Paternal Grandfather   . Hyperlipidemia Paternal Grandfather      Past medical history, social, surgical and family history all reviewed in electronic medical record.  No pertanent information unless stated regarding to the chief complaint.   Review of Systems:Review of systems updated and as accurate as of 09/30/16  No headache, visual changes, nausea, vomiting, diarrhea, constipation, dizziness, abdominal pain, skin rash, fevers, chills, night sweats, weight loss, swollen lymph nodes, body aches, joint swelling,  chest pain, shortness of breath, mood changes. Mild positive muscle aches  Objective  Blood pressure 92/62, pulse 66, weight 124 lb (56.2 kg). Systems examined below as of 09/30/16   General: No apparent distress alert and oriented x3 mood and affect normal, dressed appropriately.  HEENT: Pupils equal, extraocular movements intact  Respiratory: Patient's speak in full sentences and does not appear short of breath  Cardiovascular: No lower extremity edema, non tender, no erythema  Skin: Warm dry intact with no signs of infection or rash on extremities or on axial skeleton.  Abdomen: Soft nontender  Neuro: Cranial nerves II through XII are intact, neurovascularly intact in all extremities with 2+ DTRs and 2+ pulses.  Lymph: No lymphadenopathy of posterior or anterior cervical chain or axillae bilaterally.  Gait normal with good balance and coordination.  MSK:  Non tender with full range of motion and good stability and symmetric strength and tone of shoulders, elbows, wrist, hip, knee and ankles bilaterally. Mild hypermobility Neck: Inspection unremarkable. No palpable stepoffs. Negative Spurling's maneuver. Mild limitation lacking the last 5 of extension Grip strength and sensation normal in bilateral  hands Strength good C4 to T1 distribution No sensory change to C4 to T1 Negative Hoffman sign bilaterally Reflexes normal  Back Exam:  Inspection: Mild scoliosis noted Motion: Flexion 45 deg, Extension 25 deg, Side Bending to 45 deg bilaterally,  Rotation to 45 deg bilaterally  SLR laying: Negative  XSLR laying: Negative  Palpable tenderness: Tender to palpation and appears paraspinal musculature lumbar spine nor in the lumbosacral area. FABER: negative. Sensory change: Gross sensation intact to all lumbar and sacral dermatomes.  Reflexes: 2+ at both patellar tendons, 2+ at achilles tendons, Babinski's downgoing.  Strength at foot  Plantar-flexion: 5/5 Dorsi-flexion: 5/5 Eversion: 5/5 Inversion: 5/5  Leg strength  Quad: 5/5 Hamstring: 5/5 Hip flexor: 5/5 Hip abductors: 5/5  Gait unremarkable.   Osteopathic findingst C4 flexed rotated and side bent right  C7 flexed rotated and side bent left T3 extended rotated and side bent right inhaled third rib T7 flexed rotated and side bent right T11 extended rotated and side bent left L2 flexed rotated and side bent right Sacrum right on right     Impression and Recommendations:     This case required medical decision making of moderate complexity.      Note: This dictation was prepared with Dragon dictation along with smaller phrase technology. Any transcriptional errors that result from this process are unintentional.

## 2016-09-30 NOTE — Assessment & Plan Note (Signed)
Decision today to treat with OMT was based on Physical Exam  After verbal consent patient was treated with HVLA, ME, FPR techniques in cervical, thoracic, lumbar and sacral areas  Patient tolerated the procedure well with improvement in symptoms  Patient given exercises, stretches and lifestyle modifications  See medications in patient instructions if given  Patient will follow up in 4-12 weeks 

## 2016-10-10 DIAGNOSIS — F410 Panic disorder [episodic paroxysmal anxiety] without agoraphobia: Secondary | ICD-10-CM | POA: Diagnosis not present

## 2016-11-16 DIAGNOSIS — H5213 Myopia, bilateral: Secondary | ICD-10-CM | POA: Diagnosis not present

## 2016-12-01 IMAGING — US US ABDOMEN COMPLETE
2 series · 13 of 25 positions shown · non-contrast
Comparison: 03/24/2011

CLINICAL DATA: Right upper quadrant abdominal pain for several
weeks.

EXAM:
ABDOMEN ULTRASOUND COMPLETE

[Series 1: us abdomen complete · 12 of 118 slices shown (1 of 2)]
[im 1/118]
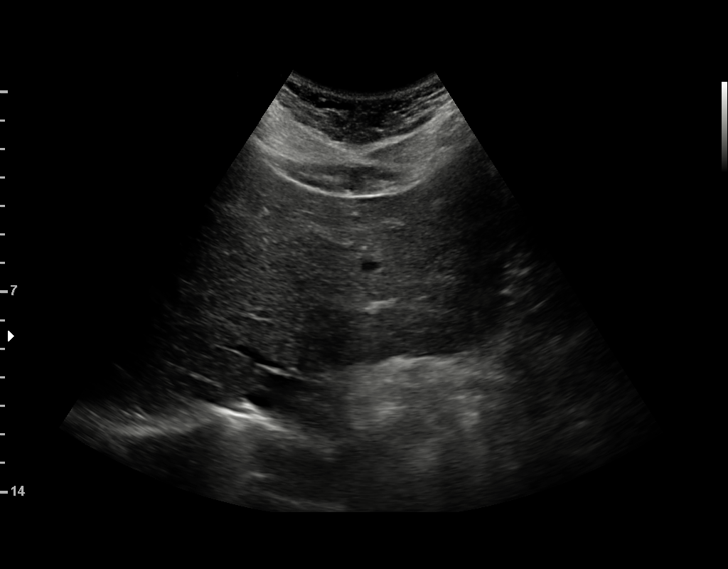
[im 11/118]
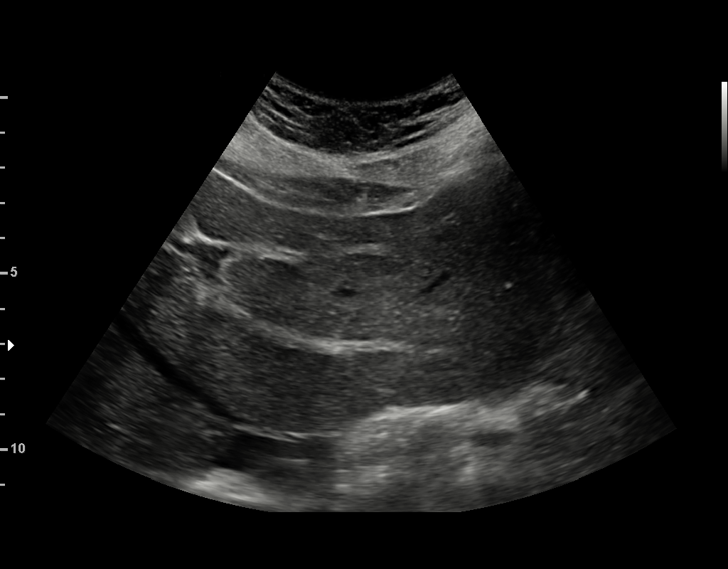
[im 21/118]
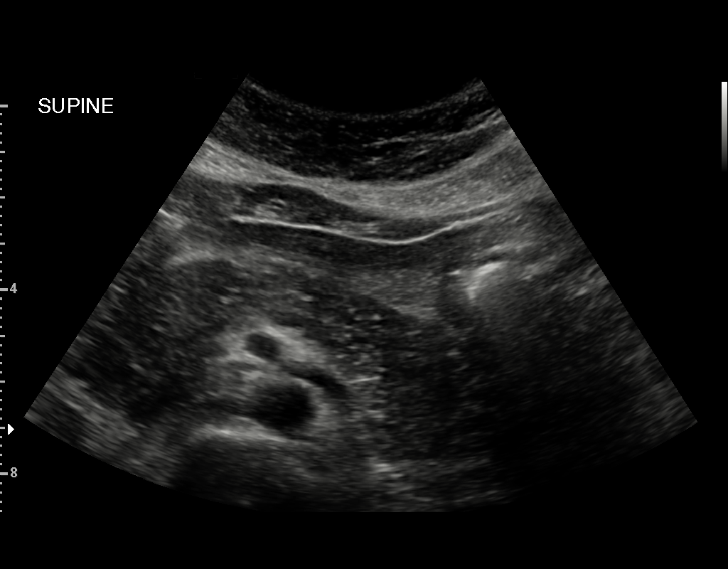
[im 31/118]
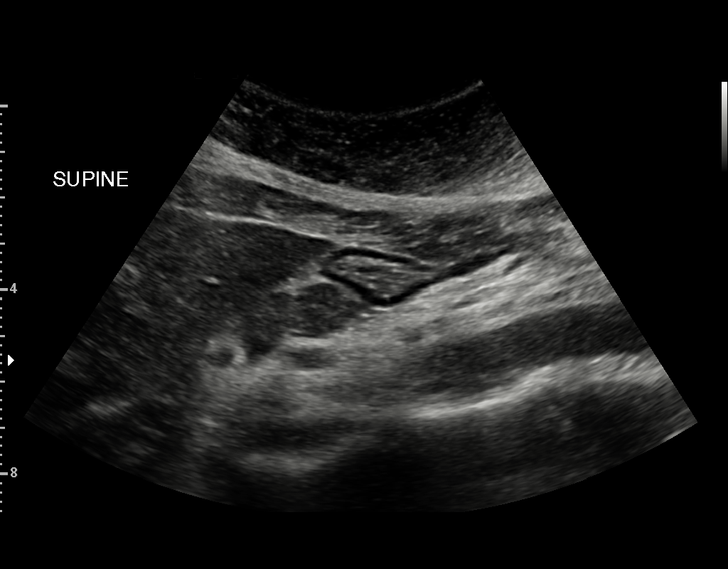
[im 41/118]
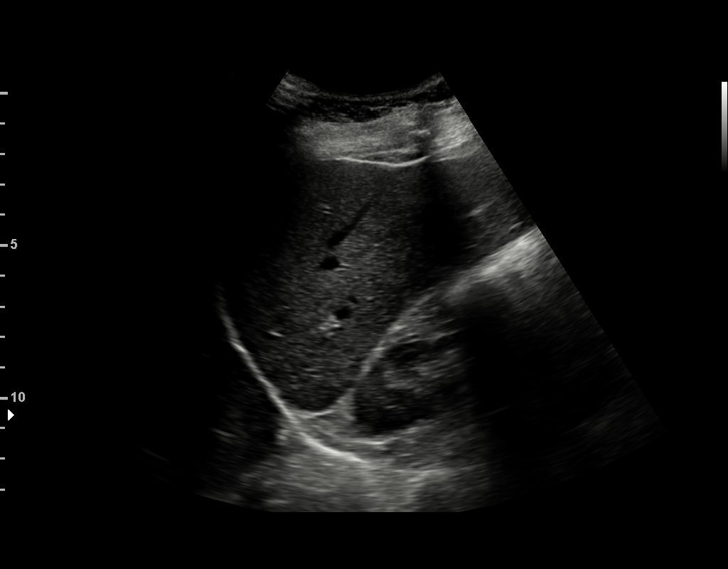
[im 51/118]
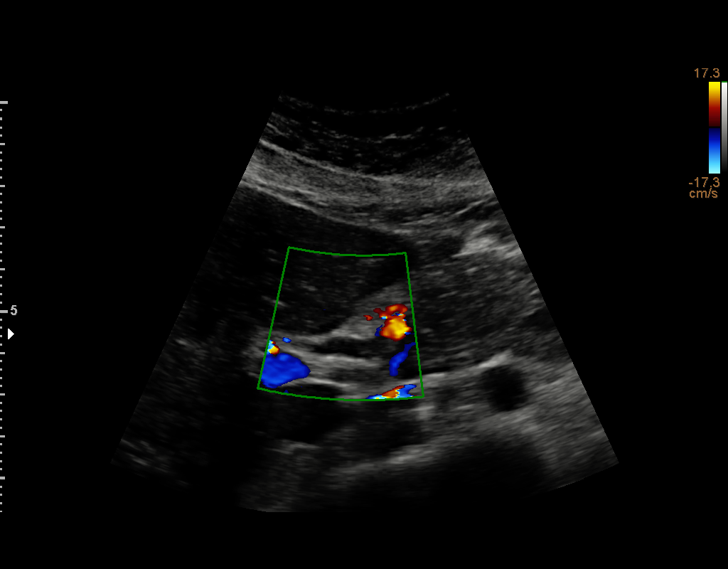
[im 62/118]
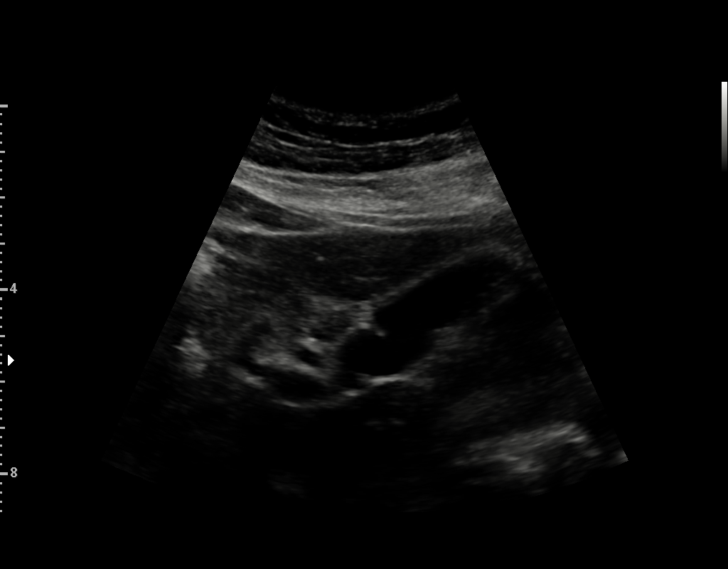
[im 72/118]
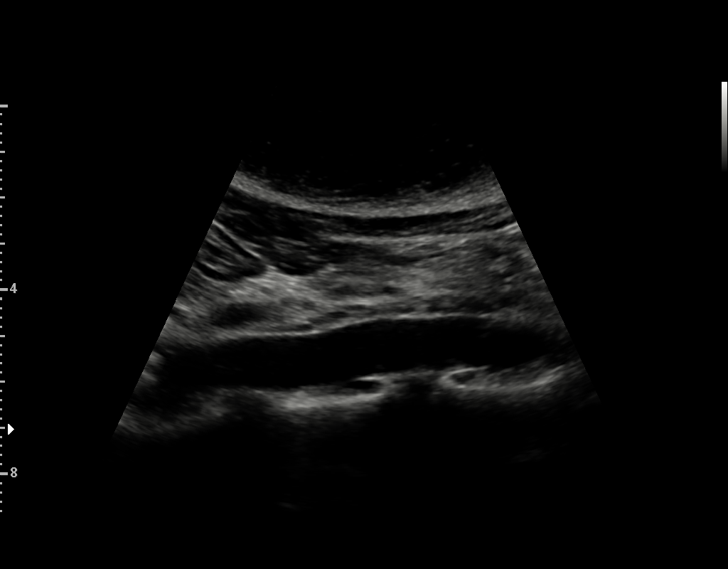
[im 82/118]
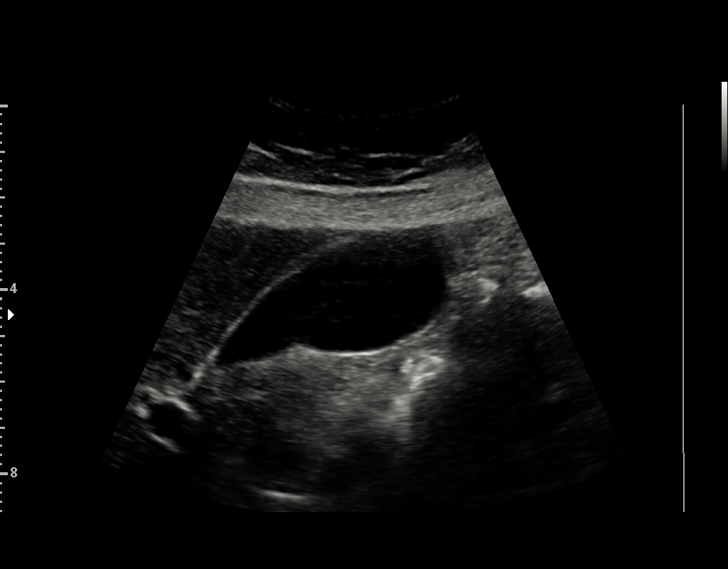
[im 92/118]
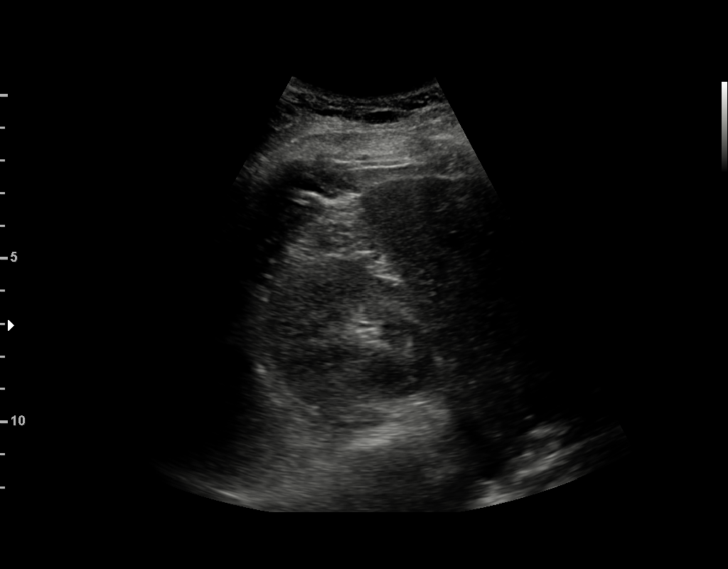
[im 102/118]
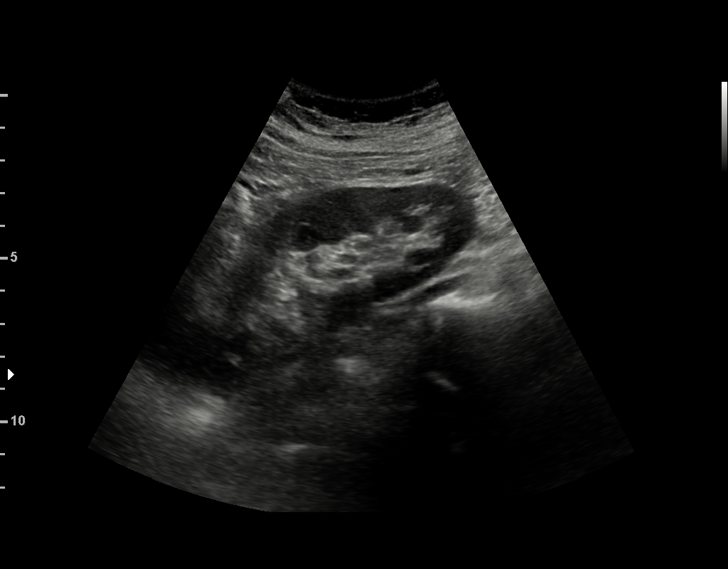
[im 112/118]
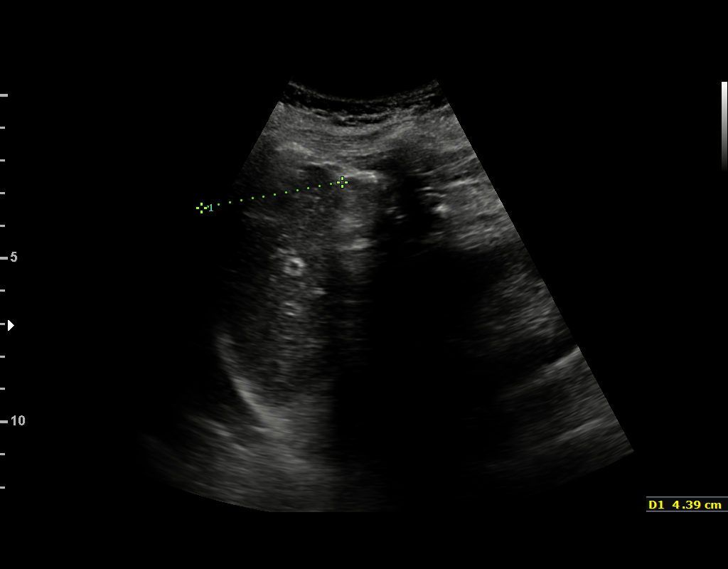

[Series 2: us abdomen complete · 1 of 7 slices shown (2 of 2)]
[im 1/7]
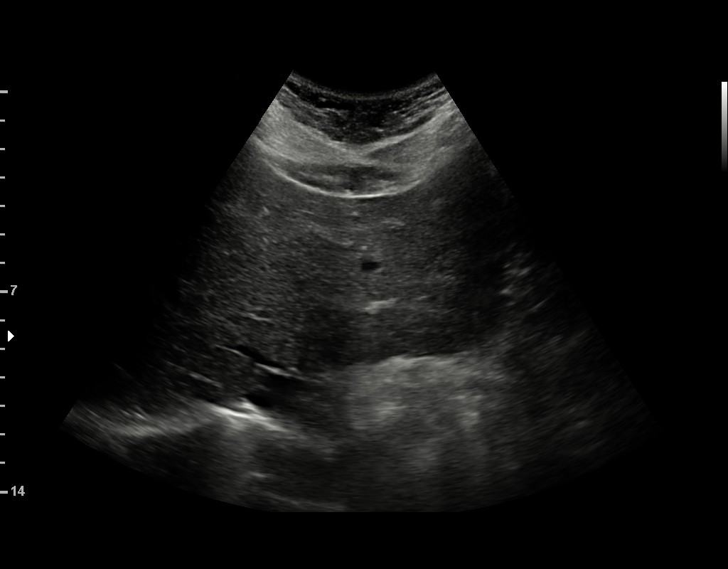

[13 of 25 positions shown; findings below may reference images not displayed]

FINDINGS: Gallbladder: No gallstones or wall thickening visualized. No
sonographic Murphy sign noted by sonographer.

Common bile duct: Diameter: 4 mm, within normal limits.

Liver: No focal lesion identified. Within normal limits in
parenchymal echogenicity.

IVC: No abnormality visualized.

Pancreas: Visualized portion unremarkable.

Spleen: Size and appearance within normal limits.

Right Kidney: Length: 9.2. Echogenicity within normal limits. No
mass or hydronephrosis visualized.

Left Kidney: Length: 10.5. Echogenicity within normal limits. No
mass or hydronephrosis visualized.

Abdominal aorta: No aneurysm visualized.

Other findings: None.
IMPRESSION: Negative abdomen ultrasound.

## 2017-02-12 NOTE — Progress Notes (Signed)
[name] [place], [place] [address] Phone: [phone] Subjective:     CC: Shoulder pain  [id number]  [name] is a 46 y.o. female coming in with complaint of have seen patient for multiple problems previously.  Patient has responded well to cervical neck pain and back pain to manipulation.  Patient states that her neck and shoulder on the right side bother her. She is having pain down her back and into her ribs. She believes that it is from the repetitive motions of working. She has tried turmeric and glucosamine for her pain as well as stretching.      Past Medical History:  Diagnosis Date  . Endometriosis   . Kidney stones    Past Surgical History:  Procedure Laterality Date  . ABDOMINAL EXPLORATION SURGERY    . TONSILLECTOMY    . TUBAL LIGATION     Social History   Socioeconomic History  . Marital status: Married    Spouse name: None  . Number of children: 3  . Years of education: None  . Highest education level: None  Social Needs  . Financial resource strain: None  . Food insecurity - worry: None  . Food insecurity - inability: None  . Transportation needs - medical: None  . Transportation needs - non-medical: None  Occupational History  . 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 Smoking status: Never Smoker  . Smokeless tobacco: Never Used  Substance and Sexual Activity  . Alcohol use: Yes    Alcohol/week: 2.5 oz    Types: 5 Standard drinks or equivalent per week  . Drug use: No  . Sexual activity: Yes    Partners: Male    Birth control/protection: Surgical  Other Topics Concern  . 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 Sulfa Antibiotics Hives  . Septra [Bactrim] Rash   Family History  Problem Relation Age of  Onset  . Coronary artery disease Unknown   . Rheum arthritis Mother   . Stroke Mother   . Hypertension Mother   . Heart disease Mother   . Cancer Mother   . Glaucoma Unknown   . Diabetes Father   . Heart disease Father   . Hyperlipidemia Father   . Hypertension Father   . Heart disease Maternal Grandmother   . Diabetes Paternal Grandmother   . Hypertension Paternal Grandmother   . Heart disease Paternal Grandfather   . Hyperlipidemia Paternal Grandfather      Past medical history, social, surgical and family history all reviewed in electronic medical record.  No pertanent information unless stated regarding to the chief complaint.   Review of Systems:Review of systems updated and as accurate as of 02/13/17  No headache, visual changes, nausea, vomiting, diarrhea, constipation, dizziness, skin rash, fevers, chills, night sweats, weight loss, swollen lymph nodes, body aches, joint swelling, chest pain, shortness of breath, mood changes.  Positive muscle aches mild intermittent abdominal pain  Objective  Blood pressure 118/82, pulse 93, height 4\' 11"  (1.499 m), weight 125 lb (56.7 kg), SpO2 98 %. Systems examined below as of 02/13/17   General: No apparent distress alert and oriented x3 mood and affect normal, dressed appropriately.  HEENT: Pupils equal, extraocular movements  intact  Respiratory: Patient's speak in full sentences and does not appear short of breath  Cardiovascular: No lower extremity edema, non tender, no erythema  Skin: Warm dry intact with no signs of infection or rash on extremities or on axial skeleton.  Abdomen: Soft nontender  Neuro: Cranial nerves II through XII are intact, neurovascularly intact in all extremities with 2+ DTRs and 2+ pulses.  Lymph: No lymphadenopathy of posterior or anterior cervical chain or axillae bilaterally.  Gait normal with good balance and coordination.  MSK:  Non tender with full range of motion and good stability and symmetric  strength and tone of shoulders, elbows, wrist, hip, knee and ankles bilaterally.  Neck: Inspection mild loss of lordosis. No palpable stepoffs. Negative Spurling's maneuver. Mild limitation lacking last 5 degrees of extension and right-sided side bending and rotation Grip strength and sensation normal in bilateral hands Strength good C4 to T1 distribution No sensory change to C4 to T1 Negative Hoffman sign bilaterally Reflexes normal Significant tightness of the right trapezius from previous exams.  Trigger points noted.  Osteopathic findings C2 flexed rotated and side bent right C4 flexed rotated and side bent left C7 flexed rotated and side bent right  T3 extended rotated and side bent right inhaled third rib T9 extended rotated and side bent left L3 flexed rotated and side bent right Sacrum right on right  Procedure note After verbal consent patient was prepped with an alcohol swab and then injected for distinct trigger points in the right shoulder region mostly in the trapezius muscle.  Total of 3 cc of 0.5% Marcaine and 1 cc of Kenalog 40 mg/mL used.  No blood loss.  Postinjection instructions given.  Band-Aids placed     Impression and Recommendations:     This case required medical decision making of moderate complexity.      Note: This dictation was prepared with Dragon dictation along with smaller phrase technology. Any transcriptional errors that result from this process are unintentional.

## 2017-02-13 ENCOUNTER — Ambulatory Visit: Payer: 59 | Admitting: Family Medicine

## 2017-02-13 ENCOUNTER — Encounter: Payer: Self-pay | Admitting: Family Medicine

## 2017-02-13 VITALS — BP 118/82 | HR 93 | Ht 59.0 in | Wt 125.0 lb

## 2017-02-13 DIAGNOSIS — M25511 Pain in right shoulder: Secondary | ICD-10-CM | POA: Diagnosis not present

## 2017-02-13 DIAGNOSIS — M999 Biomechanical lesion, unspecified: Secondary | ICD-10-CM | POA: Diagnosis not present

## 2017-02-13 DIAGNOSIS — M542 Cervicalgia: Secondary | ICD-10-CM

## 2017-02-13 MED ORDER — PREDNISONE 50 MG PO TABS: 50.0000 mg | ORAL_TABLET | Freq: Every day | ORAL | 0 refills | Status: DC

## 2017-02-13 NOTE — Assessment & Plan Note (Signed)
Decision today to treat with OMT was based on Physical Exam  After verbal consent patient was treated with HVLA, ME, FPR techniques in cervical, thoracic, rib, lumbar and sacral areas  Patient tolerated the procedure well with improvement in symptoms  Patient given exercises, stretches and lifestyle modifications  See medications in patient instructions if given  Patient will follow up in 4-6 weeks 

## 2017-02-13 NOTE — Assessment & Plan Note (Signed)
Cervical muscle pain.  Did have some trigger points in the right trapezius area.  This is worsening than previous exam.  We discussed icing regimen.  Home exercises.  Patient will follow up with me again in 4-6 weeks.

## 2017-02-13 NOTE — Patient Instructions (Signed)
Good to see you  Ice 20 minutes 2 times daily. Usually after activity and before bed. Prednisone daily for 5 days if not making progress.  See me again in 3 weeks

## 2017-02-13 NOTE — Assessment & Plan Note (Signed)
Patient given trigger point injection.  Tolerated the procedure well.  We excise this.  We discussed which activities of doing which was negative to avoid.  Patient given prednisone for any breakthrough pain.  Follow-up again in 4-6 weeks.

## 2017-03-08 ENCOUNTER — Ambulatory Visit: Payer: 59 | Admitting: Family Medicine

## 2017-03-08 ENCOUNTER — Encounter: Payer: Self-pay | Admitting: Family Medicine

## 2017-03-08 VITALS — BP 100/72 | HR 90 | Ht 59.0 in | Wt 121.0 lb

## 2017-03-08 DIAGNOSIS — M545 Low back pain: Secondary | ICD-10-CM

## 2017-03-08 DIAGNOSIS — M999 Biomechanical lesion, unspecified: Secondary | ICD-10-CM

## 2017-03-08 DIAGNOSIS — G8929 Other chronic pain: Secondary | ICD-10-CM | POA: Diagnosis not present

## 2017-03-08 NOTE — Assessment & Plan Note (Signed)
Overall doing remarkably better.  Do not feel that any other aggressive interventions are necessary at this time.  Encourage patient continue to work on the weight loss, core strengthening, posture and ergonomics.  Responds well to osteopathic manipulation.  Follow-up again in 6-8 weeks

## 2017-03-08 NOTE — Assessment & Plan Note (Signed)
Decision today to treat with OMT was based on Physical Exam  After verbal consent patient was treated with HVLA, ME, FPR techniques in cervical, thoracic, rib, lumbar and sacral areas  Patient tolerated the procedure well with improvement in symptoms  Patient given exercises, stretches and lifestyle modifications  See medications in patient instructions if given  Patient will follow up in 6-8 weeks 

## 2017-03-08 NOTE — Patient Instructions (Signed)
Good to see you  Ice is your friend  I am impressed  Keep it up  See me again in 6-8 weeks

## 2017-03-08 NOTE — Progress Notes (Signed)
[name] [place], [place] [address] Phone: [phone] Subjective:    I'm seeing this patient by the request  of:    CC: Low back pain follow-up  [id number]  [name] is a 47 y.o. female coming in with complaint of low back pain.  Also has had neck pain.  Has responded fairly well to osteopathic manipulation.  Patient was to be doing home exercises as well as over-the-counter medications.  We have also attempted trigger point injections in the right trapezius at last exam 3 weeks ago.  Patient states that the injections helped a lot to decrease her pain.  Patient states that she is losing weight and feels like that is helping her back pain as well.     Past Medical History:  Diagnosis Date  . Endometriosis   . Kidney stones    Past Surgical History:  Procedure Laterality Date  . ABDOMINAL EXPLORATION SURGERY    . TONSILLECTOMY    . TUBAL LIGATION     Social History   Socioeconomic History  . Marital status: Married    Spouse name: None  . Number of children: 3  . Years of education: None  . Highest education level: None  Social Needs  . Financial resource strain: None  . Food insecurity - worry: None  . Food insecurity - inability: None  . Transportation needs - medical: None  . Transportation needs - non-medical: None  Occupational History  . 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 Smoking status: Never Smoker  . Smokeless tobacco: Never Used  Substance and Sexual Activity  . Alcohol use: Yes    Alcohol/week: 2.5 oz    Types: 5 Standard drinks or equivalent per week  . Drug use: No  . Sexual activity: Yes    Partners: Male    Birth control/protection: Surgical  Other Topics Concern  . 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 Sulfa Antibiotics Hives  . Septra [Bactrim] Rash   Family History  Problem Relation Age of Onset  . Coronary artery disease Unknown   . Rheum arthritis Mother   . Stroke Mother   . Hypertension Mother   . Heart disease Mother   . Cancer Mother   . Glaucoma Unknown   . Diabetes Father   . Heart disease Father   . Hyperlipidemia Father   . Hypertension Father   . Heart disease Maternal Grandmother   . Diabetes Paternal Grandmother   . Hypertension Paternal Grandmother   . Heart disease Paternal Grandfather   . Hyperlipidemia Paternal Grandfather      Past medical history, social, surgical and family history all reviewed in electronic medical record.  No pertanent information unless stated regarding to the chief complaint.   Review of Systems:Review of systems updated and as accurate as of 03/08/17  No headache, visual changes, nausea, vomiting, diarrhea, constipation, dizziness, abdominal pain, skin rash, fevers, chills, night sweats, weight loss, swollen lymph nodes, body aches, joint swelling, muscle aches, chest pain, shortness of breath, mood changes.   Objective  Blood pressure 100/72, pulse 90, height 4\' 11"  (1.499 m), weight 121 lb (54.9 kg), SpO2 99 %. Systems examined below as of 03/08/17   General:  No apparent distress alert and oriented x3 mood and affect normal, dressed appropriately.  HEENT: Pupils equal, extraocular movements intact  Respiratory: Patient's speak in full sentences and does not appear short of breath  Cardiovascular: No lower extremity edema, non tender, no erythema  Skin: Warm dry intact with no signs of infection or rash on extremities or on axial skeleton.  Abdomen: Soft nontender  Neuro: Cranial nerves II through XII are intact, neurovascularly intact in all extremities with 2+ DTRs and 2+ pulses.  Lymph: No lymphadenopathy of posterior or anterior cervical chain or axillae bilaterally.  Gait normal with good balance and  coordination.  MSK:  Non tender with full range of motion and good stability and symmetric strength and tone of shoulders, elbows, wrist, hip, knee and ankles bilaterally.  Back Exam:  Inspection: Unremarkable  Motion: Flexion 35 deg, Extension 25 deg, Side Bending to 35 deg bilaterally,  Rotation to305 deg bilaterally  SLR laying: Negative  XSLR laying: Negative  Palpable tenderness: Tender to palpation in the paraspinal musculature lumbar spine.  Mild over the thoracolumbar juncture less tightness around the scapular area. FABER: negative. Sensory change: Gross sensation intact to all lumbar and sacral dermatomes.  Reflexes: 2+ at both patellar tendons, 2+ at achilles tendons, Babinski's downgoing.  Strength at foot  Plantar-flexion: 5/5 Dorsi-flexion: 5/5 Eversion: 5/5 Inversion: 5/5  Leg strength  Quad: 5/5 Hamstring: 5/5 Hip flexor: 5/5 Hip abductors: 5/5  Gait unremarkable.  Osteopathic findings C2 flexed rotated and side bent right C4 flexed rotated and side bent left C6 flexed rotated and side bent left T3 extended rotated and side bent right inhaled third rib T9 extended rotated and side bent left L3 flexed rotated and side bent left Sacrum left     Impression and Recommendations:     This case required medical decision making of moderate complexity.      Note: This dictation was prepared with Dragon dictation along with smaller phrase technology. Any transcriptional errors that result from this process are unintentional.

## 2017-04-18 NOTE — Progress Notes (Signed)
[name] [place], [place] [address] Phone: [phone] Subjective:    I'm seeing this patient by the request  of:    CC: Back and neck pain follow-up  [id number]  [name] is a 47 y.o. female coming in with complaint of back and neck pain.  Patient does have some known scoliosis.  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  Has had some tightness of the back.  Stent relatively well for quite some time but is having some discomfort.  Does respond well to osteopathic manipulation.  Taking some vitamins.        Past Medical History:  Diagnosis Date  . Endometriosis   . Kidney stones    Past Surgical History:  Procedure Laterality Date  . ABDOMINAL EXPLORATION SURGERY    . TONSILLECTOMY    . TUBAL LIGATION     Social History   Socioeconomic History  . Marital status: Married    Spouse name: None  . Number of children: 3  . Years of education: None  . Highest education level: None  Social Needs  . Financial resource strain: None  . Food insecurity - worry: None  . Food insecurity - inability: None  . Transportation needs - medical: None  . Transportation needs - non-medical: None  Occupational History  . 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 Smoking status: Never Smoker  . Smokeless tobacco: Never Used  Substance and Sexual Activity  . Alcohol use: Yes    Alcohol/week: 2.5 oz    Types: 5 Standard drinks or equivalent per week  . Drug use: No  . Sexual activity: Yes    Partners: Male    Birth control/protection: Surgical  Other Topics Concern  . 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 Sulfa Antibiotics Hives  . Septra [Bactrim] Rash   Family History  Problem Relation Age of Onset  . Coronary artery disease Unknown     . Rheum arthritis Mother   . Stroke Mother   . Hypertension Mother   . Heart disease Mother   . Cancer Mother   . Glaucoma Unknown   . Diabetes Father   . Heart disease Father   . Hyperlipidemia Father   . Hypertension Father   . Heart disease Maternal Grandmother   . Diabetes Paternal Grandmother   . Hypertension Paternal Grandmother   . Heart disease Paternal Grandfather   . Hyperlipidemia Paternal Grandfather      Past medical history, social, surgical and family history all reviewed in electronic medical record.  No pertanent information unless stated regarding to the chief complaint.   Review of Systems:Review of systems updated and as accurate as of 04/19/17  No headache, visual changes, nausea, vomiting, diarrhea, constipation, dizziness, abdominal pain, skin rash, fevers, chills, night sweats, weight loss, swollen lymph nodes, body aches, joint swelling, muscle aches, chest pain, shortness of breath, mood changes.   Objective  Blood pressure 130/80, pulse 80, height 4\' 11"  (1.499 m), weight 123 lb (55.8 kg), SpO2 98 %. Systems examined below as of 04/19/17   General: No apparent distress alert and oriented x3 mood and affect normal, dressed appropriately.  HEENT: Pupils equal, extraocular movements intact  Respiratory: Patient's speak in full sentences and does not appear short of breath  Cardiovascular: No lower extremity edema, non tender, no erythema  Skin: Warm dry intact with no signs of infection or rash on extremities or on axial skeleton.  Abdomen: Soft nontender  Neuro: Cranial nerves II through XII are intact, neurovascularly intact in all extremities with 2+ DTRs and 2+ pulses.  Lymph: No lymphadenopathy of posterior or anterior cervical chain or axillae bilaterally.  Gait normal with good balance and coordination.  MSK:  Non tender with full range of motion and good stability and symmetric strength and tone of shoulders, elbows, wrist, hip, knee and ankles  bilaterally.  Back Exam:  Inspection: Mild scoliosis Motion: Flexion 45 deg, Extension 25 deg, Side Bending to 45 deg bilaterally,  Rotation to 45 deg bilaterally  SLR laying: Negative  XSLR laying: Negative  Palpable tenderness: Tender to palpation.  Seem to be more of the thoracolumbar junction.[name] FABER: negative. Sensory change: Gross sensation intact to all lumbar and sacral dermatomes.  Reflexes: 2+ at both patellar tendons, 2+ at achilles tendons, Babinski's downgoing.  Strength at foot  Plantar-flexion: 5/5 Dorsi-flexion: 5/5 Eversion: 5/5 Inversion: 5/5  Leg strength  Quad: 5/5 Hamstring: 5/5 Hip flexor: 5/5 Hip abductors: 4/5 but symmetric Gait unremarkable.    Osteopathic findings C4 flexed rotated and side bent left C6 flexed rotated and side bent left T6 extended rotated and side bent left L2 flexed rotated and side bent right Sacrum right on right     Impression and Recommendations:     This case required medical decision making of moderate complexity.      Note: This dictation was prepared with Dragon dictation along with smaller phrase technology. Any transcriptional errors that result from this process are unintentional.

## 2017-04-19 ENCOUNTER — Encounter: Payer: Self-pay | Admitting: Family Medicine

## 2017-04-19 ENCOUNTER — Ambulatory Visit: Payer: 59 | Admitting: Family Medicine

## 2017-04-19 VITALS — BP 130/80 | HR 80 | Ht 59.0 in | Wt 123.0 lb

## 2017-04-19 DIAGNOSIS — G8929 Other chronic pain: Secondary | ICD-10-CM | POA: Diagnosis not present

## 2017-04-19 DIAGNOSIS — M5441 Lumbago with sciatica, right side: Secondary | ICD-10-CM | POA: Diagnosis not present

## 2017-04-19 DIAGNOSIS — M999 Biomechanical lesion, unspecified: Secondary | ICD-10-CM

## 2017-04-19 NOTE — Assessment & Plan Note (Signed)
Stable overall.  Discussed icing regimen and home exercises.  Which activities to do which wants to avoid.  Patient is increased activity overall.  Patient will follow up with me again 4 weeks

## 2017-04-19 NOTE — Assessment & Plan Note (Signed)
Decision today to treat with OMT was based on Physical Exam  After verbal consent patient was treated with HVLA, ME, FPR techniques in cervical, thoracic, lumbar and sacral areas  Patient tolerated the procedure well with improvement in symptoms  Patient given exercises, stretches and lifestyle modifications  See medications in patient instructions if given  Patient will follow up in 4 weeks 

## 2017-04-19 NOTE — Patient Instructions (Signed)
Good to see you  Ice is your friend Keep working on the posture See me again in 6-7 weeks!

## 2017-05-24 ENCOUNTER — Ambulatory Visit: Payer: 59 | Admitting: Family Medicine

## 2017-05-24 ENCOUNTER — Encounter: Payer: Self-pay | Admitting: Family Medicine

## 2017-05-24 VITALS — BP 106/70 | HR 105 | Wt 121.0 lb

## 2017-05-24 DIAGNOSIS — M999 Biomechanical lesion, unspecified: Secondary | ICD-10-CM

## 2017-05-24 DIAGNOSIS — F410 Panic disorder [episodic paroxysmal anxiety] without agoraphobia: Secondary | ICD-10-CM | POA: Diagnosis not present

## 2017-05-24 DIAGNOSIS — G8929 Other chronic pain: Secondary | ICD-10-CM

## 2017-05-24 DIAGNOSIS — M5441 Lumbago with sciatica, right side: Secondary | ICD-10-CM | POA: Diagnosis not present

## 2017-05-24 MED ORDER — HYDROXYZINE HCL 10 MG PO TABS: 10.0000 mg | ORAL_TABLET | Freq: Three times a day (TID) | ORAL | 1 refills | Status: DC | PRN

## 2017-05-24 NOTE — Progress Notes (Signed)
[name] [place], [place] [address] Phone: [phone] Subjective:      CC: Back pain follow-up  [id number]  [name] is a 47 y.o. female coming in with complaint of back pain.  Patient has been doing relatively well with conservative therapy.  Recently under more stress with her children being sick recently.  They seem to be improving.  Continues to have more stomach and back pain though.  Patient denies any radiation down the legs or any numbness.  States some of the underlying anxiety is improved with the hydroxyzine.    Past Medical History:  Diagnosis Date  . Endometriosis   . Kidney stones    Past Surgical History:  Procedure Laterality Date  . ABDOMINAL EXPLORATION SURGERY    . TONSILLECTOMY    . TUBAL LIGATION     Social History   Socioeconomic History  . Marital status: Married    Spouse name: Not on file  . Number of children: 3  . Years of education: Not on file  . Highest education level: Not on file  Occupational History  . 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 Financial resource strain: Not on file  . Food insecurity:    Worry: Not on file    Inability: Not on file  . Transportation needs:    Medical: Not on file    Non-medical: Not on file  Tobacco Use  . Smoking status: Never Smoker  . Smokeless tobacco: Never Used  Substance and Sexual Activity  . Alcohol use: Yes    Alcohol/week: 2.5 oz    Types: 5 Standard drinks or equivalent per week  . Drug use: No  . Sexual activity: Yes    Partners: Male    Birth control/protection: Surgical  Lifestyle  . Physical activity:    Days per week: Not on file    Minutes per session: Not on file  . Stress: Not on file  Relationships  . Social connections:    Talks on phone: Not on file    Gets together: Not on file    Attends religious service: Not on file    Active member of club or  organization: Not on file    Attends meetings of clubs or organizations: Not on file    Relationship status: Not on file  Other Topics Concern  . 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 Sulfa Antibiotics Hives  . Septra [Bactrim] Rash   Family History  Problem Relation Age of Onset  . Coronary artery disease Unknown   . Rheum arthritis Mother   . Stroke Mother   . Hypertension Mother   . Heart disease Mother   . Cancer Mother   . Glaucoma Unknown   . Diabetes Father   . Heart disease Father   . Hyperlipidemia Father   . Hypertension Father   . Heart disease Maternal Grandmother   . Diabetes Paternal Grandmother   . Hypertension Paternal Grandmother   . Heart disease Paternal Grandfather   . Hyperlipidemia Paternal Grandfather      Past medical history, social, surgical and family history all reviewed in electronic medical record.  No pertanent information unless stated regarding to the chief  complaint.   Review of Systems:Review of systems updated and as accurate as of 05/24/17  No headache, visual changes, nausea, vomiting, diarrhea, constipation, dizziness, abdominal pain, skin rash, fevers, chills, night sweats, weight loss, swollen lymph nodes, body aches, joint swelling, muscle aches, chest pain, shortness of breath, mood changes.   Objective  There were no vitals taken for this visit. Systems examined below as of 05/24/17   General: No apparent distress alert and oriented x3 mood and affect normal, dressed appropriately.  HEENT: Pupils equal, extraocular movements intact  Respiratory: Patient's speak in full sentences and does not appear short of breath  Cardiovascular: No lower extremity edema, non tender, no erythema  Skin: Warm dry intact with no signs of infection or rash on extremities or on axial skeleton.  Abdomen:  Soft nontender  Neuro: Cranial nerves II through XII are intact, neurovascularly intact in all extremities with 2+ DTRs and 2+ pulses.  Lymph: No lymphadenopathy of posterior or anterior cervical chain or axillae bilaterally.  Gait normal with good balance and coordination.  MSK:  Non tender with full range of motion and good stability and symmetric strength and tone of shoulders, elbows, wrist, hip, knee and ankles bilaterally.  Back Exam:  Inspection: Mild lumbar lordosis and very mild underlying scoliosis noted Motion: Flexion 45 deg, Extension 25 deg, Side Bending to 35 deg bilaterally,  Rotation to 35 deg bilaterally  SLR laying: Negative  XSLR laying: Negative  Palpable tenderness: Tender to palpation in the paraspinal musculature lumbar spine right greater than left.[name] FABER: Positive right. Sensory change: Gross sensation intact to all lumbar and sacral dermatomes.  Reflexes: 2+ at both patellar tendons, 2+ at achilles tendons, Babinski's downgoing.  Strength at foot  Plantar-flexion: 5/5 Dorsi-flexion: 5/5 Eversion: 5/5 Inversion: 5/5  Leg strength  Quad: 5/5 Hamstring: 5/5 Hip flexor: 5/5 Hip abductors: 4/5 symmetric Gait unremarkable. Osteopathic findings  C7 flexed rotated and side bent left T3 extended rotated and side bent right inhaled third rib T5 extended rotated and side bent left L2 flexed rotated and side bent right Sacrum right on right     Impression and Recommendations:     This case required medical decision making of moderate complexity.      Note: This dictation was prepared with Dragon dictation along with smaller phrase technology. Any transcriptional errors that result from this process are unintentional.

## 2017-05-24 NOTE — Assessment & Plan Note (Signed)
Decision today to treat with OMT was based on Physical Exam  After verbal consent patient was treated with HVLA, ME, FPR techniques in cervical, thoracic, lumbar and sacral areas  Patient tolerated the procedure well with improvement in symptoms  Patient given exercises, stretches and lifestyle modifications  See medications in patient instructions if given  Patient will follow up in 6-8 weeks 

## 2017-05-24 NOTE — Patient Instructions (Signed)
Refilled hydroxyzine if you need it.  Keep trying to work on the posture Watch the lifting mechanics See me again in 6 week s

## 2017-05-24 NOTE — Assessment & Plan Note (Signed)
Stable overall.  I do believe that anxiety plays a role.  Discussed icing regimen and home exercise.  Responds well to osteopathic manipulation.  Follow-up again in 6-8 weeks

## 2017-06-07 DIAGNOSIS — F410 Panic disorder [episodic paroxysmal anxiety] without agoraphobia: Secondary | ICD-10-CM | POA: Diagnosis not present

## 2017-06-21 DIAGNOSIS — F410 Panic disorder [episodic paroxysmal anxiety] without agoraphobia: Secondary | ICD-10-CM | POA: Diagnosis not present

## 2017-07-05 ENCOUNTER — Encounter: Payer: Self-pay | Admitting: Family Medicine

## 2017-07-05 ENCOUNTER — Ambulatory Visit: Payer: 59 | Admitting: Family Medicine

## 2017-07-05 VITALS — BP 104/80 | HR 85 | Ht 59.0 in | Wt 119.0 lb

## 2017-07-05 DIAGNOSIS — M999 Biomechanical lesion, unspecified: Secondary | ICD-10-CM | POA: Diagnosis not present

## 2017-07-05 DIAGNOSIS — M25511 Pain in right shoulder: Secondary | ICD-10-CM

## 2017-07-05 NOTE — Assessment & Plan Note (Signed)
Worsening symptoms given repeat injections.  Tolerated the procedure fairly well.  Did have significant tightness.  Discussed icing regimen and home exercise.  Discussed which activities to do which wants to avoid.  Patient is to increase activity slowly over the course the next several days.  Follow-up with me again in 4 to 8 weeks

## 2017-07-05 NOTE — Patient Instructions (Signed)
Good to see you  Ice is your friend I hope the injections help  See me again in 4 weeks

## 2017-07-05 NOTE — Assessment & Plan Note (Signed)
Decision today to treat with OMT was based on Physical Exam  After verbal consent patient was treated with HVLA, ME, FPR techniques in cervical, thoracic, rib, lumbar and sacral areas  Patient tolerated the procedure well with improvement in symptoms  Patient given exercises, stretches and lifestyle modifications  See medications in patient instructions if given  Patient will follow up in 8 weeks 

## 2017-07-05 NOTE — Progress Notes (Signed)
[name] [place], [place] [address] Phone: [phone] Subjective:     CC: Back pain.,  Neck pain  [id number]  [name] is a 47 y.o. female coming in with complaint of neck pain.  Tender than usual.  Significant daily activities.  Patient states that tight enough that is woken up at night.  States that she is unable to turn her head to the right .  Significant tightness of the right trapezius.  Minimal radiation down the arm but it is occurring intermittently.     Past Medical History:  Diagnosis Date  . Endometriosis   . Kidney stones    Past Surgical History:  Procedure Laterality Date  . ABDOMINAL EXPLORATION SURGERY    . TONSILLECTOMY    . TUBAL LIGATION     Social History   Socioeconomic History  . Marital status: Married    Spouse name: Not on file  . Number of children: 3  . Years of education: Not on file  . Highest education level: Not on file  Occupational History  . 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 Financial resource strain: Not on file  . Food insecurity:    Worry: Not on file    Inability: Not on file  . Transportation needs:    Medical: Not on file    Non-medical: Not on file  Tobacco Use  . Smoking status: Never Smoker  . Smokeless tobacco: Never Used  Substance and Sexual Activity  . Alcohol use: Yes    Alcohol/week: 2.5 oz    Types: 5 Standard drinks or equivalent per week  . Drug use: No  . Sexual activity: Yes    Partners: Male    Birth control/protection: Surgical  Lifestyle  . Physical activity:    Days per week: Not on file    Minutes per session: Not on file  . Stress: Not on file  Relationships  . Social connections:    Talks on phone: Not on file    Gets together: Not on file    Attends religious service: Not on file    Active member of club or organization: Not on file    Attends meetings of clubs or  organizations: Not on file    Relationship status: Not on file  Other Topics Concern  . 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 Sulfa Antibiotics Hives  . Septra [Bactrim] Rash   Family History  Problem Relation Age of Onset  . Coronary artery disease Unknown   . Rheum arthritis Mother   . Stroke Mother   . Hypertension Mother   . Heart disease Mother   . Cancer Mother   . Glaucoma Unknown   . Diabetes Father   . Heart disease Father   . Hyperlipidemia Father   . Hypertension Father   . Heart disease Maternal Grandmother   . Diabetes Paternal Grandmother   . Hypertension Paternal Grandmother   . Heart disease Paternal Grandfather   . Hyperlipidemia Paternal Grandfather      Past medical history, social, surgical and family history all reviewed in electronic medical record.  No pertanent information unless stated regarding to the chief complaint.   Review of Systems:Review  of systems updated and as accurate as of 07/05/17  No headache, visual changes, nausea, vomiting, diarrhea, constipation, dizziness, abdominal pain, skin rash, fevers, chills, night sweats, weight loss, swollen lymph nodes, body aches, joint swelling, muscle aches, chest pain, shortness of breath, mood changes.   Objective  Blood pressure 104/80, pulse 85, height 4\' 11"  (1.499 m), weight 119 lb (54 kg), SpO2 98 %. Systems examined below as of 07/05/17   General: No apparent distress alert and oriented x3 mood and affect normal, dressed appropriately.  HEENT: Pupils equal, extraocular movements intact  Respiratory: Patient's speak in full sentences and does not appear short of breath  Cardiovascular: No lower extremity edema, non tender, no erythema  Skin: Warm dry intact with no signs of infection or rash on extremities or on axial skeleton.  Abdomen: Soft  nontender  Neuro: Cranial nerves II through XII are intact, neurovascularly intact in all extremities with 2+ DTRs and 2+ pulses.  Lymph: No lymphadenopathy of posterior or anterior cervical chain or axillae bilaterally.  Gait normal with good balance and coordination.  MSK:  Non tender with full range of motion and good stability and symmetric strength and tone of shoulders, elbows, wrist, hip, knee and ankles bilaterally.  Neck exam shows significant tightness.  Loss of range of motion in all planes by at least 5 degrees.  Patient has no rotation to the right secondary to tightness.  Significant number of trigger points noted in the right trapezius.  Osteopathic findings C2 flexed rotated and side bent right T3 extended rotated and side bent right inhaled third rib L2 flexed rotated and side bent right Sacrum right on right  Per verbal consent patient was prepped with alcohol swabs and with a 25-gauge 1 inch needle was injected into 4 distinct trigger points in the right trapezius area.  Total of 3 cc of 0.5% Marcaine and 1 cc of Kenalog 40 mg/mL used.  Patient tolerated procedure well with no blood loss.    Impression and Recommendations:     This case required medical decision making of moderate complexity.      Note: This dictation was prepared with Dragon dictation along with smaller phrase technology. Any transcriptional errors that result from this process are unintentional.

## 2017-07-11 DIAGNOSIS — Z01419 Encounter for gynecological examination (general) (routine) without abnormal findings: Secondary | ICD-10-CM | POA: Diagnosis not present

## 2017-07-11 DIAGNOSIS — Z1231 Encounter for screening mammogram for malignant neoplasm of breast: Secondary | ICD-10-CM | POA: Diagnosis not present

## 2017-07-11 DIAGNOSIS — Z6822 Body mass index (BMI) 22.0-22.9, adult: Secondary | ICD-10-CM | POA: Diagnosis not present

## 2017-07-19 DIAGNOSIS — F410 Panic disorder [episodic paroxysmal anxiety] without agoraphobia: Secondary | ICD-10-CM | POA: Diagnosis not present

## 2017-07-24 DIAGNOSIS — N83202 Unspecified ovarian cyst, left side: Secondary | ICD-10-CM | POA: Diagnosis not present

## 2017-08-01 NOTE — Progress Notes (Signed)
[name] [name], [name] [address] Phone: [phone] Subjective:    I'm seeing this patient by the request  of:    CC: Neck and back pain  YQM:[id number]  [name] is a 47 y.o. female coming in with complaint of neck and back pain.  Worsening neck pain recently.  Noticing more radiation down to the hand.  Does not notice any weakness but sometimes feels like she is more clumsy with this hand.  Patient is right-handed dominant.  States that the pain can be fairly severe at night as well.  Patient still able to do daily activities just fine.  Significant tightness still noted.     Past Medical History:  Diagnosis Date  . Endometriosis   . Kidney stones    Past Surgical History:  Procedure Laterality Date  . ABDOMINAL EXPLORATION SURGERY    . TONSILLECTOMY    . TUBAL LIGATION     Social History   Socioeconomic History  . Marital status: Married    Spouse name: Not on file  . Number of children: 3  . Years of education: Not on file  . Highest education level: Not on file  Occupational History  . 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 Financial resource strain: Not on file  . Food insecurity:    Worry: Not on file    Inability: Not on file  . Transportation needs:    Medical: Not on file    Non-medical: Not on file  Tobacco Use  . Smoking status: Never Smoker  . Smokeless tobacco: Never Used  Substance and Sexual Activity  . Alcohol use: Yes    Alcohol/week: 3.0 oz    Types: 5 Standard drinks or equivalent per week  . Drug use: No  . Sexual activity: Yes    Partners: Male    Birth control/protection: Surgical  Lifestyle  . Physical activity:    Days per week: Not on file    Minutes per session: Not on file  . Stress: Not on file  Relationships  . Social connections:    Talks on phone: Not on file    Gets together: Not on file    Attends religious  service: Not on file    Active member of club or organization: Not on file    Attends meetings of clubs or organizations: Not on file    Relationship status: Not on file  Other Topics Concern  . 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 Sulfa Antibiotics Hives  . Septra [Bactrim] Rash   Family History  Problem Relation Age of Onset  . Coronary artery disease Unknown   . Rheum arthritis Mother   . Stroke Mother   . Hypertension Mother   . Heart disease Mother   . Cancer Mother   . Glaucoma Unknown   . Diabetes Father   . Heart disease Father   . Hyperlipidemia Father   . Hypertension Father   . Heart disease Maternal Grandmother   . Diabetes Paternal Grandmother   . Hypertension Paternal Grandmother   . Heart disease Paternal Grandfather   . Hyperlipidemia Paternal Grandfather      Past medical history, social, surgical and family  history all reviewed in electronic medical record.  No pertanent information unless stated regarding to the chief complaint.   Review of Systems:Review of systems updated and as accurate as of 08/02/17  No headache, visual changes, nausea, vomiting, diarrhea, constipation, dizziness, abdominal pain, skin rash, fevers, chills, night sweats, weight loss, swollen lymph nodes, body aches, joint swelling, muscle aches, chest pain, shortness of breath, mood changes.   Objective  Blood pressure 110/70, pulse 88, height 4\' 11"  (1.499 m), weight 117 lb (53.1 kg), SpO2 97 %. Systems examined below as of 08/02/17   General: No apparent distress alert and oriented x3 mood and affect normal, dressed appropriately.  HEENT: Pupils equal, extraocular movements intact  Respiratory: Patient's speak in full sentences and does not appear short of breath  Cardiovascular: No lower extremity edema, non tender, no erythema  Skin:  Warm dry intact with no signs of infection or rash on extremities or on axial skeleton.  Abdomen: Soft nontender  Neuro: Cranial nerves II through XII are intact, neurovascularly intact in all extremities with 2+ DTRs and 2+ pulses.  Lymph: No lymphadenopathy of posterior or anterior cervical chain or axillae bilaterally.  Gait normal with good balance and coordination.  MSK:  Non tender with full range of motion and good stability and symmetric strength and tone of shoulders, elbows, wrist, hip, knee and ankles bilaterally.  Neck: Inspection loss of lordosis. No palpable stepoffs. Positive Spurling's maneuver with radicular symptoms down the C6 distribution. Patient does have some limited side bending to the right Mild weakness in the C6 distribution Negative Hoffman sign bilaterally Reflexes normal  Right trapezius tightness  Osteopathic findings C2 flexed rotated and side bent right C4 flexed rotated and side bent left C6 flexed rotated and side bent right  T3 extended rotated and side bent right inhaled third rib T9 extended rotated and side bent left L2 flexed rotated and side bent right Sacrum right on right     Impression and Recommendations:     This case required medical decision making of moderate complexity.      Note: This dictation was prepared with Dragon dictation along with smaller phrase technology. Any transcriptional errors that result from this process are unintentional.

## 2017-08-02 ENCOUNTER — Ambulatory Visit: Payer: 59 | Admitting: Family Medicine

## 2017-08-02 ENCOUNTER — Encounter: Payer: Self-pay | Admitting: Family Medicine

## 2017-08-02 VITALS — BP 110/70 | HR 88 | Ht 59.0 in | Wt 117.0 lb

## 2017-08-02 DIAGNOSIS — M999 Biomechanical lesion, unspecified: Secondary | ICD-10-CM

## 2017-08-02 DIAGNOSIS — M5412 Radiculopathy, cervical region: Secondary | ICD-10-CM | POA: Diagnosis not present

## 2017-08-02 MED ORDER — GABAPENTIN 100 MG PO CAPS: 200.0000 mg | ORAL_CAPSULE | Freq: Every day | ORAL | 3 refills | Status: DC

## 2017-08-02 MED FILL — GABAPENTIN 100 MG CAPSULE: 100 | 30 days supply | Qty: 60 | Fill #0

## 2017-08-02 NOTE — Assessment & Plan Note (Signed)
Decision today to treat with OMT was based on Physical Exam  After verbal consent patient was treated with  ME, FPR techniques in cervical, thoracic, lumbar and sacral areas  Patient tolerated the procedure well with improvement in symptoms  Patient given exercises, stretches and lifestyle modifications  See medications in patient instructions if given  Patient will follow up in 6-8 weeks

## 2017-08-02 NOTE — Patient Instructions (Signed)
Good to see you  Neck is acting up Gabapentin 100-200mg  at night Duexis 3 times a day for 6 days to decrease inflammation  See me as scheduled.

## 2017-08-02 NOTE — Assessment & Plan Note (Signed)
Cervical radiculopathy.  Discussed icing regimen and home exercises.  Discussed which activities to do which wants to avoid.  Discussed avoiding certain activities.  Follow-up again 1 to 2 weeks.  Given prednisone and gabapentin.

## 2017-08-30 MED FILL — GABAPENTIN 100 MG CAPSULE: 100 | 30 days supply | Qty: 60 | Fill #1

## 2017-09-01 NOTE — Progress Notes (Signed)
[name] [place], [place] [address] Phone: [phone] Subjective:    I'm seeing this patient by the request  of:    CC: Back and neck pain  EPP:[id number]  [name] is a 47 y.o. female coming in with complaint of back pain. States her pain is better than the last visit.  Patient was having wrist radicular symptoms.  Has had trouble more with cervical radiculopathy.  Feels like she has noticed some improvement with the prednisone.  Taking gabapentin regularly.  Nothing severe low.  Denies any weakness of the hands or lower extremities     Past Medical History:  Diagnosis Date  . Endometriosis   . Kidney stones    Past Surgical History:  Procedure Laterality Date  . ABDOMINAL EXPLORATION SURGERY    . TONSILLECTOMY    . TUBAL LIGATION     Social History   Socioeconomic History  . Marital status: Married    Spouse name: Not on file  . Number of children: 3  . Years of education: Not on file  . Highest education level: Not on file  Occupational History  . 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 Financial resource strain: Not on file  . Food insecurity:    Worry: Not on file    Inability: Not on file  . Transportation needs:    Medical: Not on file    Non-medical: Not on file  Tobacco Use  . Smoking status: Never Smoker  . Smokeless tobacco: Never Used  Substance and Sexual Activity  . Alcohol use: Yes    Alcohol/week: 3.0 oz    Types: 5 Standard drinks or equivalent per week  . Drug use: No  . Sexual activity: Yes    Partners: Male    Birth control/protection: Surgical  Lifestyle  . Physical activity:    Days per week: Not on file    Minutes per session: Not on file  . Stress: Not on file  Relationships  . Social connections:    Talks on phone: Not on file    Gets together: Not on file    Attends religious service: Not on file    Active member of club  or organization: Not on file    Attends meetings of clubs or organizations: Not on file    Relationship status: Not on file  Other Topics Concern  . 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 Sulfa Antibiotics Hives  . Septra [Bactrim] Rash   Family History  Problem Relation Age of Onset  . Coronary artery disease Unknown   . Rheum arthritis Mother   . Stroke Mother   . Hypertension Mother   . Heart disease Mother   . Cancer Mother   . Glaucoma Unknown   . Diabetes Father   . Heart disease Father   . Hyperlipidemia Father   . Hypertension Father   . Heart disease Maternal Grandmother   . Diabetes Paternal Grandmother   . Hypertension Paternal Grandmother   . Heart disease Paternal Grandfather   . Hyperlipidemia Paternal Grandfather      Past medical history, social, surgical and family history all reviewed in electronic medical record.  No pertanent information unless  stated regarding to the chief complaint.   Review of Systems:Review of systems updated and as accurate as of 09/04/17  No headache, visual changes, nausea, vomiting, diarrhea, constipation, dizziness, abdominal pain, skin rash, fevers, chills, night sweats, weight loss, swollen lymph nodes, body aches, joint swelling, chest pain, shortness of breath, mood changes.  Positive muscle aches  Objective  Blood pressure 100/60, pulse 87, height 4\' 11"  (1.499 m), weight 117 lb (53.1 kg), SpO2 97 %. Systems examined below as of 09/04/17   General: No apparent distress alert and oriented x3 mood and affect normal, dressed appropriately.  HEENT: Pupils equal, extraocular movements intact  Respiratory: Patient's speak in full sentences and does not appear short of breath  Cardiovascular: No lower extremity edema, non tender, no erythema  Skin: Warm dry intact with no signs of  infection or rash on extremities or on axial skeleton.  Abdomen: Soft nontender  Neuro: Cranial nerves II through XII are intact, neurovascularly intact in all extremities with 2+ DTRs and 2+ pulses.  Lymph: No lymphadenopathy of posterior or anterior cervical chain or axillae bilaterally.  Gait normal with good balance and coordination.  MSK:  Non tender with full range of motion and good stability and symmetric strength and tone of shoulders, elbows, wrist, hip, knee and ankles bilaterally.   Back exam shows some mild scoliosis.  Patient does have some tightness in the sacroiliac joint bilaterally.  Patient does have some tenderness in the paraspinal musculature.  Neck exam does show a positive Spurling's on the right side.  Patient does have some mild limited range of motion with sidebending bilaterally.  Tightness of the right trapezius  Osteopathic findings C2 flexed rotated and side bent right T3 extended rotated and side bent right inhaled third rib T6 extended rotated and side bent left L3 flexed rotated and side bent right Sacrum right on right    Impression and Recommendations:     This case required medical decision making of moderate complexity.      Note: This dictation was prepared with Dragon dictation along with smaller phrase technology. Any transcriptional errors that result from this process are unintentional.

## 2017-09-04 ENCOUNTER — Ambulatory Visit: Payer: 59 | Admitting: Family Medicine

## 2017-09-04 ENCOUNTER — Encounter: Payer: Self-pay | Admitting: Family Medicine

## 2017-09-04 VITALS — BP 100/60 | HR 87 | Ht 59.0 in | Wt 117.0 lb

## 2017-09-04 DIAGNOSIS — M999 Biomechanical lesion, unspecified: Secondary | ICD-10-CM | POA: Diagnosis not present

## 2017-09-04 DIAGNOSIS — M5412 Radiculopathy, cervical region: Secondary | ICD-10-CM

## 2017-09-04 NOTE — Assessment & Plan Note (Signed)
Continues to have mild radicular symptoms.  We discussed with patient in great length.  Discussed posture, ergonomics, gabapentin.  Patient wants to avoid any other medications if possible.  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  Follow-up again in 4 to 6 weeks

## 2017-09-04 NOTE — Assessment & Plan Note (Signed)
Decision today to treat with OMT was based on Physical Exam  After verbal consent patient was treated with HVLA, ME, FPR techniques in cervical, thoracic, lumbar and sacral areas  Patient tolerated the procedure well with improvement in symptoms  Patient given exercises, stretches and lifestyle modifications  See medications in patient instructions if given  Patient will follow up in 4-6 weeks 

## 2017-09-04 NOTE — Patient Instructions (Signed)
Better overall  See me again in 4-6 weeks Continue the gabapentin regularly.

## 2017-10-09 MED FILL — GABAPENTIN 100 MG CAPSULE: 100 | 30 days supply | Qty: 60 | Fill #2

## 2017-10-12 NOTE — Progress Notes (Signed)
[name] [place], [place] [address] Phone: [phone] Subjective:       I [name] am serving as a Education administrator for Dr. Hulan Saas.  CC: Back pain  QMG:[id number]  [name] is a 47 y.o. female coming in with complaint of back pain. Doing a little better.  Patient has known mild degenerative changes as well as having scoliosis.  Has been doing relatively well overall.  Some tightness.  No radicular symptoms.  Seems to be more in the upper back in the neck.  Some mild low back pain.  Was improving no since last visit     Past Medical History:  Diagnosis Date  . Endometriosis   . Kidney stones    Past Surgical History:  Procedure Laterality Date  . ABDOMINAL EXPLORATION SURGERY    . TONSILLECTOMY    . TUBAL LIGATION     Social History   Socioeconomic History  . Marital status: Married    Spouse name: Not on file  . Number of children: 3  . Years of education: Not on file  . Highest education level: Not on file  Occupational History  . 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 Financial resource strain: Not on file  . Food insecurity:    Worry: Not on file    Inability: Not on file  . Transportation needs:    Medical: Not on file    Non-medical: Not on file  Tobacco Use  . Smoking status: Never Smoker  . Smokeless tobacco: Never Used  Substance and Sexual Activity  . Alcohol use: Yes    Alcohol/week: 5.0 standard drinks    Types: 5 Standard drinks or equivalent per week  . Drug use: No  . Sexual activity: Yes    Partners: Male    Birth control/protection: Surgical  Lifestyle  . Physical activity:    Days per week: Not on file    Minutes per session: Not on file  . Stress: Not on file  Relationships  . Social connections:    Talks on phone: Not on file    Gets together: Not on file    Attends religious service: Not on file    Active member of club or  organization: Not on file    Attends meetings of clubs or organizations: Not on file    Relationship status: Not on file  Other Topics Concern  . 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 Sulfa Antibiotics Hives  . Septra [Bactrim] Rash   Family History  Problem Relation Age of Onset  . Coronary artery disease Unknown   . Rheum arthritis Mother   . Stroke Mother   . Hypertension Mother   . Heart disease Mother   . Cancer Mother   . Glaucoma Unknown   . Diabetes Father   . Heart disease Father   . Hyperlipidemia Father   . Hypertension Father   . Heart disease Maternal Grandmother   . Diabetes Paternal Grandmother   . Hypertension Paternal Grandmother   . Heart disease Paternal Grandfather   . Hyperlipidemia Paternal Grandfather          Current Outpatient Medications (Other):  .  gabapentin (NEURONTIN) 100  MG capsule, Take 2 capsules (200 mg total) by mouth at bedtime. .  hydrOXYzine (ATARAX/VISTARIL) 10 MG tablet, Take 1 tablet (10 mg total) by mouth 3 (three) times daily as needed. .  Multiple Vitamins-Minerals (MULTIVITAMINS THER. W/MINERALS) TABS, Take 1 tablet by mouth daily. .  Omega-3 Fatty Acids (FISH OIL) 1000 MG CAPS, Take 1,000 mg by mouth daily.     Past medical history, social, surgical and family history all reviewed in electronic medical record.  No pertanent information unless stated regarding to the chief complaint.   Review of Systems:  No headache, visual changes, nausea, vomiting, diarrhea, constipation, dizziness, abdominal pain, skin rash, fevers, chills, night sweats, weight loss, swollen lymph nodes, body aches, joint swelling,  chest pain, shortness of breath, mood changes.  Positive muscle aches  Objective  Blood pressure 108/80, pulse 93, height 4\' 11"  (1.499 m), weight 120 lb (54.4 kg), SpO2 98 %.     General: No apparent distress alert and oriented x3 mood and affect normal, dressed appropriately.  HEENT: Pupils equal, extraocular movements intact  Respiratory: Patient's speak in full sentences and does not appear short of breath  Cardiovascular: No lower extremity edema, non tender, no erythema  Skin: Warm dry intact with no signs of infection or rash on extremities or on axial skeleton.  Abdomen: Soft nontender  Neuro: Cranial nerves II through XII are intact, neurovascularly intact in all extremities with 2+ DTRs and 2+ pulses.  Lymph: No lymphadenopathy of posterior or anterior cervical chain or axillae bilaterally.  Gait normal with good balance and coordination.  MSK:  Non tender with full range of motion and good stability and symmetric strength and tone of shoulders, elbows, wrist, hip, knee and ankles bilaterally.  Patient is neck exam shows some mild loss of lordosis.  Negative Spurling's test.  Grip strength is full and symmetric.  Deep tendon reflexes intact.  Tightness is noted around the trapezius bilaterally.  Osteopathic findings C2 flexed rotated and side bent right C4 flexed rotated and side bent left C6 flexed rotated and side bent left T3 extended rotated and side bent right inhaled third rib T9 extended rotated and side bent left L2 flexed rotated and side bent right Sacrum right on right     Impression and Recommendations:     This case required medical decision making of moderate complexity. The above documentation has been reviewed and is accurate and complete Lyndal Pulley, DO       Note: This dictation was prepared with Dragon dictation along with smaller phrase technology. Any transcriptional errors that result from this process are unintentional.

## 2017-10-13 ENCOUNTER — Ambulatory Visit: Payer: 59 | Admitting: Family Medicine

## 2017-10-13 ENCOUNTER — Encounter: Payer: Self-pay | Admitting: Family Medicine

## 2017-10-13 VITALS — BP 108/80 | HR 93 | Ht 59.0 in | Wt 120.0 lb

## 2017-10-13 DIAGNOSIS — M999 Biomechanical lesion, unspecified: Secondary | ICD-10-CM

## 2017-10-13 DIAGNOSIS — M5412 Radiculopathy, cervical region: Secondary | ICD-10-CM

## 2017-10-13 DIAGNOSIS — M9904 Segmental and somatic dysfunction of sacral region: Secondary | ICD-10-CM

## 2017-10-13 DIAGNOSIS — M9901 Segmental and somatic dysfunction of cervical region: Secondary | ICD-10-CM

## 2017-10-13 DIAGNOSIS — M9908 Segmental and somatic dysfunction of rib cage: Secondary | ICD-10-CM

## 2017-10-13 DIAGNOSIS — M9903 Segmental and somatic dysfunction of lumbar region: Secondary | ICD-10-CM

## 2017-10-13 DIAGNOSIS — M9902 Segmental and somatic dysfunction of thoracic region: Secondary | ICD-10-CM

## 2017-10-13 NOTE — Patient Instructions (Addendum)
Good to see you  burn your plantar warts  Consider also some over the counter wart cream for a week.  See me again in 5-6 weeks

## 2017-10-13 NOTE — Assessment & Plan Note (Signed)
Decision today to treat with OMT was based on Physical Exam  After verbal consent patient was treated with HVLA, ME, FPR techniques in cervical, thoracic, rib,  lumbar and sacral areas  Patient tolerated the procedure well with improvement in symptoms  Patient given exercises, stretches and lifestyle modifications  See medications in patient instructions if given  Patient will follow up in 4-8 weeks 

## 2017-10-13 NOTE — Assessment & Plan Note (Signed)
Stable.  No radicular symptoms at this time.  Does take the gabapentin intermittently.  Has the other medications for any breakthrough.  Discussed icing regimen, home exercise, which activities to doing which wants to avoid.  Follow-up again 4 to 8 weeks

## 2017-11-07 ENCOUNTER — Ambulatory Visit: Payer: 59 | Admitting: Family Medicine

## 2017-12-10 NOTE — Progress Notes (Signed)
[name] [address] [place], [place] [address] Phone: [phone] Subjective:      CC: Neck pain  [id number]  [name] is a 47 y.o. female coming in with complaint of neck pain. Has had an increase in pain over the past 2 days. Feels shooting pain in right trap. Rotation elicits this symptom.  Patient has had tightness before.  Has responded to trigger points before does not feel like it is that bad.  Also done well with manipulation.  Rates the severity pain is 8 out of 10.      Past Medical History:  Diagnosis Date  . Endometriosis   . Kidney stones    Past Surgical History:  Procedure Laterality Date  . ABDOMINAL EXPLORATION SURGERY    . TONSILLECTOMY    . TUBAL LIGATION     Social History   Socioeconomic History  . Marital status: Married    Spouse name: Not on file  . Number of children: 3  . Years of education: Not on file  . Highest education level: Not on file  Occupational History  . 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 Financial resource strain: Not on file  . Food insecurity:    Worry: Not on file    Inability: Not on file  . Transportation needs:    Medical: Not on file    Non-medical: Not on file  Tobacco Use  . Smoking status: Never Smoker  . Smokeless tobacco: Never Used  Substance and Sexual Activity  . Alcohol use: Yes    Alcohol/week: 5.0 standard drinks    Types: 5 Standard drinks or equivalent per week  . Drug use: No  . Sexual activity: Yes    Partners: Male    Birth control/protection: Surgical  Lifestyle  . Physical activity:    Days per week: Not on file    Minutes per session: Not on file  . Stress: Not on file  Relationships  . Social connections:    Talks on phone: Not on file    Gets together: Not on file    Attends religious service: Not on file    Active member of club or organization: Not on file    Attends meetings of  clubs or organizations: Not on file    Relationship status: Not on file  Other Topics Concern  . 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 Sulfa Antibiotics Hives  . Septra [Bactrim] Rash   Family History  Problem Relation Age of Onset  . Coronary artery disease Unknown   . Rheum arthritis Mother   . Stroke Mother   . Hypertension Mother   . Heart disease Mother   . Cancer Mother   . Glaucoma Unknown   . Diabetes Father   . Heart disease Father   . Hyperlipidemia Father   . Hypertension Father   . Heart disease Maternal Grandmother   . Diabetes Paternal Grandmother   . Hypertension Paternal Grandmother   . Heart disease Paternal Grandfather   . Hyperlipidemia Paternal Grandfather          Current Outpatient Medications (Other):  .  gabapentin (NEURONTIN) 100 MG capsule, Take 2 capsules (200 mg total) by mouth  at bedtime. .  hydrOXYzine (ATARAX/VISTARIL) 10 MG tablet, Take 1 tablet (10 mg total) by mouth 3 (three) times daily as needed. .  Multiple Vitamins-Minerals (MULTIVITAMINS THER. W/MINERALS) TABS, Take 1 tablet by mouth daily. .  Omega-3 Fatty Acids (FISH OIL) 1000 MG CAPS, Take 1,000 mg by mouth daily.     Past medical history, social, surgical and family history all reviewed in electronic medical record.  No pertanent information unless stated regarding to the chief complaint.   Review of Systems:  No headache, visual changes, nausea, vomiting, diarrhea, constipation, dizziness, abdominal pain, skin rash, fevers, chills, night sweats, weight loss, swollen lymph nodes, body aches, joint swelling, muscle aches, chest pain, shortness of breath, mood changes.   Objective  There were no vitals taken for this visit. Systems examined below as of    General: No apparent distress alert and oriented x3 mood and affect normal,  dressed appropriately.  HEENT: Pupils equal, extraocular movements intact  Respiratory: Patient's speak in full sentences and does not appear short of breath  Cardiovascular: No lower extremity edema, non tender, no erythema  Skin: Warm dry intact with no signs of infection or rash on extremities or on axial skeleton.  Abdomen: Soft nontender  Neuro: Cranial nerves II through XII are intact, neurovascularly intact in all extremities with 2+ DTRs and 2+ pulses.  Lymph: No lymphadenopathy of posterior or anterior cervical chain or axillae bilaterally.  Gait normal with good balance and coordination.  MSK:  Non tender with full range of motion and good stability and symmetric strength and tone of shoulders, elbows, wrist, hip, knee and ankles bilaterally.  Neck exam shows loss of lordosis.  Patient has some tenderness to palpation the paraspinal musculature in the cervical spine as well as in the cervical thoracic area.  Tightness noted bilaterally with rotation and sidebending but does have full range of motion.  Osteopathic findings C2 flexed rotated and side bent right C4 flexed rotated and side bent left C6 flexed rotated and side bent left T2 extended rotated and side bent right inhaled rib T9 extended rotated and side bent left L3 flexed rotated and side bent right Sacrum right on right     Impression and Recommendations:     This case required medical decision making of moderate complexity. The above documentation has been reviewed and is accurate and complete Lyndal Pulley, DO       Note: This dictation was prepared with Dragon dictation along with smaller phrase technology. Any transcriptional errors that result from this process are unintentional.

## 2017-12-12 ENCOUNTER — Encounter: Payer: Self-pay | Admitting: Family Medicine

## 2017-12-12 ENCOUNTER — Ambulatory Visit: Payer: 59 | Admitting: Family Medicine

## 2017-12-12 VITALS — BP 102/82 | HR 81 | Ht 59.0 in | Wt 123.0 lb

## 2017-12-12 DIAGNOSIS — M9904 Segmental and somatic dysfunction of sacral region: Secondary | ICD-10-CM | POA: Diagnosis not present

## 2017-12-12 DIAGNOSIS — M9903 Segmental and somatic dysfunction of lumbar region: Secondary | ICD-10-CM

## 2017-12-12 DIAGNOSIS — M542 Cervicalgia: Secondary | ICD-10-CM

## 2017-12-12 DIAGNOSIS — M999 Biomechanical lesion, unspecified: Secondary | ICD-10-CM | POA: Diagnosis not present

## 2017-12-12 DIAGNOSIS — M9908 Segmental and somatic dysfunction of rib cage: Secondary | ICD-10-CM

## 2017-12-12 DIAGNOSIS — M9901 Segmental and somatic dysfunction of cervical region: Secondary | ICD-10-CM | POA: Diagnosis not present

## 2017-12-12 DIAGNOSIS — M9902 Segmental and somatic dysfunction of thoracic region: Secondary | ICD-10-CM | POA: Diagnosis not present

## 2017-12-12 NOTE — Assessment & Plan Note (Signed)
Decision today to treat with OMT was based on Physical Exam  After verbal consent patient was treated with HVLA, ME, FPR techniques in cervical, thoracic, rib,  lumbar and sacral areas  Patient tolerated the procedure well with improvement in symptoms  Patient given exercises, stretches and lifestyle modifications  See medications in patient instructions if given  Patient will follow up in 4-8 weeks 

## 2017-12-12 NOTE — Patient Instructions (Signed)
Good to see you  Ice Is your friend Stay active Keep hands within peripheral vision  See me again in 6 weeks

## 2017-12-12 NOTE — Assessment & Plan Note (Signed)
More muscle pain without any radicular symptoms at this time.  Discussed icing regimen and home exercise.  Discussed which activities doing which wants to avoid.  Patient is to increase activity slowly over the course the next days.  Discussed icing regimen, home exercise, which activities to avoid.  Follow-up again in 4 to 8 weeks

## 2018-01-22 NOTE — Progress Notes (Signed)
[name] [address] [place], [place] [address] Phone: [phone] Subjective:   [name], am serving as a scribe for Dr. Hulan Saas.   CC: Back and neck pain follow-up  XNA:[id number]  [name] is a 47 y.o. female coming in with complaint of back pain. She is feeling better than last visit. Is here for OMT today to manage her lower back pain.  Patient was having more back pain.  Doing relatively well.  Not having any radicular symptoms.  Patient has had more tightness but nothing as severe as what she had previously.  No radicular symptoms.      Past Medical History:  Diagnosis Date  . Endometriosis   . Kidney stones    Past Surgical History:  Procedure Laterality Date  . ABDOMINAL EXPLORATION SURGERY    . TONSILLECTOMY    . TUBAL LIGATION     Social History   Socioeconomic History  . Marital status: Married    Spouse name: Not on file  . Number of children: 3  . Years of education: Not on file  . Highest education level: Not on file  Occupational History  . 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 Financial resource strain: Not on file  . Food insecurity:    Worry: Not on file    Inability: Not on file  . Transportation needs:    Medical: Not on file    Non-medical: Not on file  Tobacco Use  . Smoking status: Never Smoker  . Smokeless tobacco: Never Used  Substance and Sexual Activity  . Alcohol use: Yes    Alcohol/week: 5.0 standard drinks    Types: 5 Standard drinks or equivalent per week  . Drug use: No  . Sexual activity: Yes    Partners: Male    Birth control/protection: Surgical  Lifestyle  . Physical activity:    Days per week: Not on file    Minutes per session: Not on file  . Stress: Not on file  Relationships  . Social connections:    Talks on phone: Not on file    Gets together: Not on file    Attends religious service: Not on file   Active member of club or organization: Not on file    Attends meetings of clubs or organizations: Not on file    Relationship status: Not on file  Other Topics Concern  . 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 Sulfa Antibiotics Hives  . Septra [Bactrim] Rash   Family History  Problem Relation Age of Onset  . Coronary artery disease Unknown   . Rheum arthritis Mother   . Stroke Mother   . Hypertension Mother   . Heart disease Mother   . Cancer Mother   . Glaucoma Unknown   . Diabetes Father   . Heart disease Father   . Hyperlipidemia Father   . Hypertension Father   . Heart disease Maternal Grandmother   . Diabetes Paternal Grandmother   . Hypertension Paternal Grandmother   . Heart disease Paternal Grandfather   . Hyperlipidemia Paternal Grandfather          Current Outpatient Medications (Other):  .  gabapentin (NEURONTIN) 100 MG capsule,  Take 2 capsules (200 mg total) by mouth at bedtime. .  hydrOXYzine (ATARAX/VISTARIL) 10 MG tablet, Take 1 tablet (10 mg total) by mouth 3 (three) times daily as needed. .  Multiple Vitamins-Minerals (MULTIVITAMINS THER. W/MINERALS) TABS, Take 1 tablet by mouth daily. .  Omega-3 Fatty Acids (FISH OIL) 1000 MG CAPS, Take 1,000 mg by mouth daily.     Past medical history, social, surgical and family history all reviewed in electronic medical record.  No pertanent information unless stated regarding to the chief complaint.   Review of Systems:  No headache, visual changes, nausea, vomiting, diarrhea, constipation, dizziness, abdominal pain, skin rash, fevers, chills, night sweats, weight loss, swollen lymph nodes, body aches, joint swelling, muscle aches, chest pain, shortness of breath, mood changes.   Objective  There were no vitals taken for this visit. Systems examined below as of      General: No apparent distress alert and oriented x3 mood and affect normal, dressed appropriately.  HEENT: Pupils equal, extraocular movements intact  Respiratory: Patient's speak in full sentences and does not appear short of breath  Cardiovascular: No lower extremity edema, non tender, no erythema  Skin: Warm dry intact with no signs of infection or rash on extremities or on axial skeleton.  Abdomen: Soft nontender  Neuro: Cranial nerves II through XII are intact, neurovascularly intact in all extremities with 2+ DTRs and 2+ pulses.  Lymph: No lymphadenopathy of posterior or anterior cervical chain or axillae bilaterally.  Gait normal with good balance and coordination.  MSK:  Non tender with full range of motion and good stability and symmetric strength and tone of shoulders, elbows, wrist, hip, knee and ankles bilaterally.  Back Exam:  Inspection: Mild loss of lordosis mild scoliosis Motion: Flexion 45 deg, Extension 25 deg, Side Bending to 3 5 deg bilaterally,  Rotation to 45 deg bilaterally  SLR laying: Negative  XSLR laying: Negative  Palpable tenderness: Tender to palpation in the paraspinal musculature lumbar spine record of the left. FABER: Positive Faber right side. Sensory change: Gross sensation intact to all lumbar and sacral dermatomes.  Reflexes: 2+ at both patellar tendons, 2+ at achilles tendons, Babinski's downgoing.  Strength at foot  Plantar-flexion: 5/5 Dorsi-flexion: 5/5 Eversion: 5/5 Inversion: 5/5  Leg strength  Quad: 5/5 Hamstring: 5/5 Hip flexor: 5/5 Hip abductors: 4/5  Gait unremarkable.   Osteopathic findings C2 flexed rotated and side bent right T3 extended rotated and side bent right inhaled third rib T8 extended rotated and side bent left L2 flexed rotated and side bent right Sacrum right on right    Impression and Recommendations:     This case required medical decision making of moderate complexity. The above documentation has been reviewed  and is accurate and complete Lyndal Pulley, DO       Note: This dictation was prepared with Dragon dictation along with smaller phrase technology. Any transcriptional errors that result from this process are unintentional.

## 2018-01-23 ENCOUNTER — Encounter: Payer: Self-pay | Admitting: Family Medicine

## 2018-01-23 ENCOUNTER — Ambulatory Visit: Payer: 59 | Admitting: Family Medicine

## 2018-01-23 VITALS — BP 118/74 | HR 85 | Ht 59.0 in | Wt 123.0 lb

## 2018-01-23 DIAGNOSIS — M9904 Segmental and somatic dysfunction of sacral region: Secondary | ICD-10-CM

## 2018-01-23 DIAGNOSIS — M5441 Lumbago with sciatica, right side: Secondary | ICD-10-CM | POA: Diagnosis not present

## 2018-01-23 DIAGNOSIS — G8929 Other chronic pain: Secondary | ICD-10-CM | POA: Diagnosis not present

## 2018-01-23 DIAGNOSIS — M9903 Segmental and somatic dysfunction of lumbar region: Secondary | ICD-10-CM

## 2018-01-23 DIAGNOSIS — M9902 Segmental and somatic dysfunction of thoracic region: Secondary | ICD-10-CM | POA: Diagnosis not present

## 2018-01-23 DIAGNOSIS — M9908 Segmental and somatic dysfunction of rib cage: Secondary | ICD-10-CM | POA: Diagnosis not present

## 2018-01-23 DIAGNOSIS — M9901 Segmental and somatic dysfunction of cervical region: Secondary | ICD-10-CM | POA: Diagnosis not present

## 2018-01-23 DIAGNOSIS — M999 Biomechanical lesion, unspecified: Secondary | ICD-10-CM

## 2018-01-23 NOTE — Assessment & Plan Note (Signed)
Decision today to treat with OMT was based on Physical Exam  After verbal consent patient was treated with HVLA, ME, FPR techniques in cervical, thoracic, rib, lumbar and sacral areas  Patient tolerated the procedure well with improvement in symptoms  Patient given exercises, stretches and lifestyle modifications  See medications in patient instructions if given  Patient will follow up in 4-6 weeks 

## 2018-01-23 NOTE — Patient Instructions (Signed)
Good to see you  Happy holidays!  Stay active You will do well  Arnica lotin on the wrist  Wear the brace regurlarly for 2 weeks  See me again in 4-6 weeks

## 2018-01-23 NOTE — Assessment & Plan Note (Signed)
Mild scoliosis.  Patient did have an exacerbation in last exam.  Patient is still feeling better overall.  Discussed icing regimen and home exercise.  Discussed proper lifting mechanics.  Patient will follow-up with me again in 4 to 8 weeks.

## 2018-01-24 ENCOUNTER — Ambulatory Visit: Payer: 59 | Admitting: Family Medicine

## 2018-02-16 ENCOUNTER — Encounter: Payer: Self-pay | Admitting: *Deleted

## 2018-02-19 ENCOUNTER — Encounter: Payer: Self-pay | Admitting: Family Medicine

## 2018-02-19 ENCOUNTER — Ambulatory Visit (INDEPENDENT_AMBULATORY_CARE_PROVIDER_SITE_OTHER): Payer: No Typology Code available for payment source | Admitting: Family Medicine

## 2018-02-19 VITALS — BP 107/70 | HR 99 | Temp 98.1°F | Resp 16 | Ht 59.5 in | Wt 124.4 lb

## 2018-02-19 DIAGNOSIS — R1011 Right upper quadrant pain: Secondary | ICD-10-CM

## 2018-02-19 NOTE — Progress Notes (Signed)
Office Note 02/19/2018  CC:  Chief Complaint  Patient presents with  . Establish Care    Previous PCP: Dr. Melford Aase at [facility]   HPI:  [name] is a 48 y.o.  female who is here to establish care Patient's most recent primary MD: Dr. Marla Roe to switch to me b/c of insurer requirement. Old records in EPIC/HL EMR (limited) were reviewed prior to or during today's visit.  Has hx of recurrent postprandial RUQ pain consistent with symptomatic cholelithiasis.  [place] Scans x 2 have not shown any stones.  No abnormal labs.  No hx of acute cholecystitis.  Has had cholestasis when pregnant.   She reports that as long as she adjusts diet she has no problems. She currently has no complaints.   Past Medical History:  Diagnosis Date  . Arthritis   . Endometriosis   . Family history of breast cancer in mother    at age 17  . GAD (generalized anxiety disorder)    lexapro helped 2017 (Dr. Hoy Finlay did have extra stress of the Master's degree program on her at that time.  Got counseling.      [name] GERD (gastroesophageal reflux disease)   . Gestational diabetes   . Kidney stones   . Neck pain    and left shoulder pain: managed by osteopathic manipulation by Dr. Charlann Boxer.  [name] PAC (premature atrial contraction)     Past Surgical History:  Procedure Laterality Date  . ABDOMINAL EXPLORATION SURGERY  2009  . SALPINGECTOMY Right 2009  . TONSILLECTOMY    . TUBAL LIGATION Left 2009    Family History  Problem Relation Age of Onset  . Coronary artery disease Other   . Rheum arthritis Mother   . Stroke Mother   . Hypertension Mother   . Heart disease Mother   . Cancer Mother   . Arthritis Mother   . Breast cancer Mother   . Heart attack Mother 3  . Glaucoma Other   . Diabetes Father   . Heart disease Father   . Hyperlipidemia Father   . Hypertension Father   . Heart attack Father 32  . Heart disease Maternal Grandmother   . Heart attack Maternal  Grandmother 88  . Arthritis Maternal Grandmother   . Diabetes Paternal Grandmother   . Hypertension Paternal Grandmother   . Heart disease Paternal Grandmother   . Heart attack Paternal Grandmother 46  . Heart disease Paternal Grandfather   . Heart attack Paternal Grandfather 92  . Early death Maternal Grandfather        MVA    Social History   Socioeconomic History  . Marital status: Married    Spouse name: Not on file  . Number of children: 3  . Years of education: Not on file  . Highest education level: Not on file  Occupational History  . 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 Financial resource strain: Not on file  . Food insecurity:    Worry: Not on file    Inability: Not on file  . Transportation needs:    Medical: Not on file    Non-medical: Not on file  Tobacco Use  . Smoking status: Never Smoker  . Smokeless tobacco: Never Used  Substance and Sexual Activity  . Alcohol use: Yes    Comment: Occasionally  . Drug use: No  . Sexual activity: Yes    Partners:  Male    Birth control/protection: Surgical  Lifestyle  . Physical activity:    Days per week: Not on file    Minutes per session: Not on file  . Stress: Not on file  Relationships  . Social connections:    Talks on phone: Not on file    Gets together: Not on file    Attends religious service: Not on file    Active member of club or organization: Not on file    Attends meetings of clubs or organizations: Not on file    Relationship status: Not on file  . Intimate partner violence:    Fear of current or ex partner: Not on file    Emotionally abused: Not on file    Physically abused: Not on file    Forced sexual activity: Not on file  Other Topics Concern  . Not on file  Social History Narrative   Married, 3 kids.    Lives in [place].   Educ: Masters deg nursing.   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   No tob/drugs.   Alc: social.    Outpatient Encounter Medications as of 02/19/2018  Medication Sig  . ELDERBERRY PO Take 1 each by mouth daily.  . famotidine (PEPCID) 10 MG tablet Take 10 mg by mouth daily as needed for heartburn or indigestion.  [name] loratadine (CLARITIN) 10 MG tablet Take 10 mg by mouth daily as needed for allergies.  . Multiple Vitamins-Minerals (MULTIVITAMINS THER. W/MINERALS) TABS Take 1 tablet by mouth daily.  . [DISCONTINUED] gabapentin (NEURONTIN) 100 MG capsule Take 2 capsules (200 mg total) by mouth at bedtime. (Patient not taking: Reported on 02/19/2018)  . [DISCONTINUED] hydrOXYzine (ATARAX/VISTARIL) 10 MG tablet Take 1 tablet (10 mg total) by mouth 3 (three) times daily as needed. (Patient not taking: Reported on 02/19/2018)  . [DISCONTINUED] Omega-3 Fatty Acids (FISH OIL) 1000 MG CAPS Take 1,000 mg by mouth daily.    No facility-administered encounter medications on file as of 02/19/2018.     Allergies  Allergen Reactions  . Latex Other (See Comments)    Sensitivity only  . Sulfa Antibiotics Hives  . Sulfamethoxazole-Trimethoprim Rash  . Septra [Bactrim] Rash    ROS Review of Systems  Constitutional: Negative for fatigue and fever.  HENT: Negative for congestion and sore throat.   Eyes: Negative for visual disturbance.  Respiratory: Negative for cough.   Cardiovascular: Negative for chest pain.  Gastrointestinal: Negative for abdominal pain and nausea.  Genitourinary: Negative for dysuria.  Musculoskeletal: Negative for back pain and joint swelling.  Skin: Negative for rash.  Neurological: Negative for weakness and headaches.  Hematological: Negative for adenopathy.    PE; Blood pressure 107/70, pulse 99, temperature 98.1 F (36.7 C), temperature source Oral, resp. rate 16, height 4' 11.5" (1.511 m), weight 124 lb 6 oz (56.4 kg), last menstrual period 02/12/2018, SpO2 98 %. Body mass index is 24.7 kg/m.  Gen:  Alert, well appearing.  Patient is oriented to person, place, time, and situation. AFFECT: pleasant, lucid thought and speech. KNL:ZJQB: no injection, icteris, swelling, or exudate.  EOMI, PERRLA. Mouth: lips without lesion/swelling.  Oral mucosa pink and moist. Oropharynx without erythema, exudate, or swelling.  CV: RRR, no m/r/g.   LUNGS: CTA bilat, nonlabored resps, good aeration in all lung fields. EXT: no clubbing or cyanosis.  no edema.   Pertinent labs:  No results found  for: TSH Lab Results  Component Value Date   WBC 12.6 (A) 03/20/2013   HGB 14.5 03/20/2013   HCT 46.6 03/20/2013   MCV 99.5 (A) 03/20/2013   PLT 291 03/23/2011   Lab Results  Component Value Date   CREATININE 0.75 03/23/2011   BUN 14 03/23/2011   NA 138 03/23/2011   K 4.0 03/23/2011   CL 103 03/23/2011   CO2 24 03/23/2011   Lab Results  Component Value Date   ALT 11 03/23/2011   AST 15 03/23/2011   ALKPHOS 35 (L) 03/23/2011   BILITOT 0.6 03/23/2011   No results found for: CHOL No results found for: HDL No results found for: LDLCALC No results found for: TRIG No results found for: CHOLHDL No results found for: HGBA1C   ASSESSMENT AND PLAN:   New pt:  Recurrent postprandial RUQ pain: c/w cholestasis.  No documented stones and no abnl labs in past evaluations per pt. Continue with avoidance of dietary triggers. If she has persistent or more severe symptoms she [name] return and we [name] work her up as appropriate. Currently she is on no prescription meds. She gets annual labs done via her GYN MD, so she does NOT need an annual CPE through our office. We'll be for her on an as needed basis.  An After Visit Summary was printed and given to the patient.  Return for as needed.  Signed:  Crissie Sickles, MD           02/19/2018

## 2018-03-06 NOTE — Progress Notes (Signed)
[name] [address] [place], [place] [address] Phone: [phone] Subjective:   [name], am serving as a scribe for Dr. Hulan Saas.   CC: Back pain follow-up  [id number]  [name] is a 48 y.o. female coming in with complaint of back pain. Is feeling better since last visit. Here for OMT to manage her lower and thoracic spine pain.  Patient is responded well to manipulation in the past.  Patient was having some worsening pain recently has had an illness.  Has not been as active which is made her lower back feel better but continues to have tightness of the upper back.    Past Medical History:  Diagnosis Date  . Arthritis   . Endometriosis   . Family history of breast cancer in mother    at age 79  . GAD (generalized anxiety disorder)    lexapro helped 2017 (Dr. Hoy Finlay did have extra stress of the Master's degree program on her at that time.  Got counseling.      [name] GERD (gastroesophageal reflux disease)   . Gestational diabetes   . Kidney stones   . Neck pain    and left shoulder pain: managed by osteopathic manipulation by Dr. Charlann Boxer.  [name] PAC (premature atrial contraction)    Past Surgical History:  Procedure Laterality Date  . ABDOMINAL EXPLORATION SURGERY  2009  . SALPINGECTOMY Right 2009  . TONSILLECTOMY    . TUBAL LIGATION Left 2009   Social History   Socioeconomic History  . Marital status: Married    Spouse name: Not on file  . Number of children: 3  . Years of education: Not on file  . Highest education level: Not on file  Occupational History  . 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 Financial resource strain: Not on file  . Food insecurity:    Worry: Not on file    Inability: Not on file  . Transportation needs:    Medical: Not on file    Non-medical: Not on file  Tobacco Use  . Smoking status: Never Smoker  . Smokeless tobacco: Never  Used  Substance and Sexual Activity  . Alcohol use: Yes    Comment: Occasionally  . Drug use: Palmer  . Sexual activity: Yes    Partners: Male    Birth control/protection: Surgical  Lifestyle  . Physical activity:    Days per week: Not on file    Minutes per session: Not on file  . Stress: Not on file  Relationships  . Social connections:    Talks on phone: Not on file    Gets together: Not on file    Attends religious service: Not on file    Active member of club or organization: Not on file    Attends meetings of clubs or organizations: Not on file    Relationship status: Not on file  Other Topics Concern  . Not on file  Social History Narrative   Married, 3 kids.    Lives in [place].   Educ: Masters deg nursing.   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   Palmer tob/drugs.   Alc: social.   Allergies  Allergen Reactions  . Latex Other (See Comments)    Sensitivity only  . Sulfa Antibiotics  Hives  . Sulfamethoxazole-Trimethoprim Rash  . Septra [Bactrim] Rash   Family History  Problem Relation Age of Onset  . Coronary artery disease Other   . Rheum arthritis Mother   . Stroke Mother   . Hypertension Mother   . Heart disease Mother   . Cancer Mother   . Arthritis Mother   . Breast cancer Mother   . Heart attack Mother 82  . Glaucoma Other   . Diabetes Father   . Heart disease Father   . Hyperlipidemia Father   . Hypertension Father   . Heart attack Father 61  . Heart disease Maternal Grandmother   . Heart attack Maternal Grandmother 88  . Arthritis Maternal Grandmother   . Diabetes Paternal Grandmother   . Hypertension Paternal Grandmother   . Heart disease Paternal Grandmother   . Heart attack Paternal Grandmother 23  . Heart disease Paternal Grandfather   . Heart attack Paternal Grandfather 92  . Early death Maternal Grandfather        MVA      Current Outpatient Medications  (Respiratory):  .  loratadine (CLARITIN) 10 MG tablet, Take 10 mg by mouth daily as needed for allergies.    Current Outpatient Medications (Other):  [name]  ELDERBERRY PO, Take 1 each by mouth daily. .  famotidine (PEPCID) 10 MG tablet, Take 10 mg by mouth daily as needed for heartburn or indigestion. .  Multiple Vitamins-Minerals (MULTIVITAMINS THER. W/MINERALS) TABS, Take 1 tablet by mouth daily.    Past medical history, social, surgical and family history all reviewed in electronic medical record.  Palmer pertanent information unless stated regarding to the chief complaint.   Review of Systems:  Palmer headache, visual changes, nausea, vomiting, diarrhea, constipation, dizziness, abdominal pain, skin rash, fevers, chills, night sweats, weight loss, swollen lymph nodes, body aches, joint swelling,chest pain, shortness of breath, mood changes.  Positive muscle aches  Objective  Last menstrual period 02/12/2018. Systems examined below as of    General: Palmer apparent distress alert and oriented x3 mood and affect normal, dressed appropriately.  HEENT: Pupils equal, extraocular movements intact  Respiratory: Patient's speak in full sentences and does not appear short of breath  Cardiovascular: Palmer lower extremity edema, non tender, Palmer erythema  Skin: Warm dry intact with Palmer signs of infection or rash on extremities or on axial skeleton.  Abdomen: Soft nontender  Neuro: Cranial nerves II through XII are intact, neurovascularly intact in all extremities with 2+ DTRs and 2+ pulses.  Lymph: Palmer lymphadenopathy of posterior or anterior cervical chain or axillae bilaterally.  Gait normal with good balance and coordination.  MSK:  Non tender with full range of motion and good stability and symmetric strength and tone of shoulders, elbows, wrist, hip, knee and ankles bilaterally.  Neck exam significant tightness of the neck.  Mild posterior lymphadenopathy noted.  Patient does have some tightness in all planes.   Negative Spurling's.  Grip strength is 5 out of 5 and symmetric neurovascularly in the upper extremities.  Low back exam does show some scoliosis in the thoracolumbar juncture.  Tightness of the paraspinal musculature on the right side of the lumbar spine.  Tightness with Corky Sox on the right as well.  Neurovascular intact distally with negative straight leg test  Osteopathic findings C2 flexed rotated and side bent right C7 flexed rotated and side bent left T3 extended rotated and side bent right inhaled third rib T5 extended rotated and side bent left L2 flexed rotated and side  bent right Sacrum right on right    Impression and Recommendations:     This case required medical decision making of moderate complexity. The above documentation has been reviewed and is accurate and complete Lyndal Pulley, DO       Note: This dictation was prepared with Dragon dictation along with smaller phrase technology. Any transcriptional errors that result from this process are unintentional.

## 2018-03-07 ENCOUNTER — Ambulatory Visit (INDEPENDENT_AMBULATORY_CARE_PROVIDER_SITE_OTHER): Payer: No Typology Code available for payment source | Admitting: Family Medicine

## 2018-03-07 ENCOUNTER — Encounter: Payer: Self-pay | Admitting: Family Medicine

## 2018-03-07 VITALS — BP 128/78 | HR 82 | Ht 59.5 in | Wt 126.0 lb

## 2018-03-07 DIAGNOSIS — J329 Chronic sinusitis, unspecified: Secondary | ICD-10-CM | POA: Insufficient documentation

## 2018-03-07 DIAGNOSIS — M5441 Lumbago with sciatica, right side: Secondary | ICD-10-CM | POA: Diagnosis not present

## 2018-03-07 DIAGNOSIS — J320 Chronic maxillary sinusitis: Secondary | ICD-10-CM | POA: Diagnosis not present

## 2018-03-07 DIAGNOSIS — M999 Biomechanical lesion, unspecified: Secondary | ICD-10-CM | POA: Diagnosis not present

## 2018-03-07 DIAGNOSIS — G8929 Other chronic pain: Secondary | ICD-10-CM | POA: Diagnosis not present

## 2018-03-07 MED ORDER — AMOXICILLIN-POT CLAVULANATE 875-125 MG PO TABS: 1.0000 | ORAL_TABLET | Freq: Two times a day (BID) | ORAL | 0 refills | Status: AC

## 2018-03-07 MED FILL — AMOX-CLAV 875-125 MG TABLET: 875-125 | 10 days supply | Qty: 20 | Fill #0

## 2018-03-07 NOTE — Assessment & Plan Note (Signed)
Decision today to treat with OMT was based on Physical Exam  After verbal consent patient was treated with HVLA, ME, FPR techniques in cervical, thoracic, rib lumbar and sacral areas  Patient tolerated the procedure well with improvement in symptoms  Patient given exercises, stretches and lifestyle modifications  See medications in patient instructions if given  Patient will follow up in 4-6 weeks 

## 2018-03-07 NOTE — Assessment & Plan Note (Signed)
tx augmentin

## 2018-03-07 NOTE — Assessment & Plan Note (Signed)
Low back pain.  Has responded well to manipulation in the past.  We discussed icing regimen and home exercises.  Patient will continue to work on posture and ergonomics.  Patient has not felt good and will treat with antibiotics for 2 weeks of possible sinusitis.  Patient will follow-up with me again in 4 to 6 weeks.

## 2018-03-07 NOTE — Patient Instructions (Addendum)
Good to see you  Ice is your friend Stay active Augmentin for 10 days  See me again in 6 weeks

## 2018-04-18 NOTE — Progress Notes (Signed)
[name] [name], [name] [address] Phone: [phone] Subjective:   [name], am serving as a scribe for Dr. Hulan Saas.    CC: Neck pain follow-up  [id number]  [name] is a 48 y.o. female coming in with complaint of neck pain. Has felt like she has had a neck spasm for past 2 days. Is here for OMT today to manage her neck pain.  More tightness there.  Back pain seems to be relatively okay.  Still having some right-sided radicular symptoms intermittently on the right but nothing severe.  No weakness.     Past Medical History:  Diagnosis Date  . Arthritis   . Endometriosis   . Family history of breast cancer in mother    at age 55  . GAD (generalized anxiety disorder)    lexapro helped 2017 (Dr. Hoy Finlay did have extra stress of the Master's degree program on her at that time.  Got counseling.      [name] GERD (gastroesophageal reflux disease)   . Gestational diabetes   . Kidney stones   . Neck pain    and left shoulder pain: managed by osteopathic manipulation by Dr. Charlann Boxer.  [name] PAC (premature atrial contraction)    Past Surgical History:  Procedure Laterality Date  . ABDOMINAL EXPLORATION SURGERY  2009  . SALPINGECTOMY Right 2009  . TONSILLECTOMY    . TUBAL LIGATION Left 2009   Social History   Socioeconomic History  . Marital status: Married    Spouse name: Not on file  . Number of children: 3  . Years of education: Not on file  . Highest education level: Not on file  Occupational History  . 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 Financial resource strain: Not on file  . Food insecurity:    Worry: Not on file    Inability: Not on file  . Transportation needs:    Medical: Not on file    Non-medical: Not on file  Tobacco Use  . Smoking status: Never Smoker  . Smokeless tobacco: Never Used  Substance and Sexual Activity  . Alcohol use:  Yes    Comment: Occasionally  . Drug use: No  . Sexual activity: Yes    Partners: Male    Birth control/protection: Surgical  Lifestyle  . Physical activity:    Days per week: Not on file    Minutes per session: Not on file  . Stress: Not on file  Relationships  . Social connections:    Talks on phone: Not on file    Gets together: Not on file    Attends religious service: Not on file    Active member of club or organization: Not on file    Attends meetings of clubs or organizations: Not on file    Relationship status: Not on file  Other Topics Concern  . Not on file  Social History Narrative   Married, 3 kids.    Lives in [place].   Educ: Masters deg nursing.   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   No tob/drugs.   Alc: social.   Allergies  Allergen Reactions  . Latex Other (See Comments)    Sensitivity only  . Sulfa Antibiotics Hives  . Sulfamethoxazole-Trimethoprim Rash  . Septra [  Bactrim] Rash   Family History  Problem Relation Age of Onset  . Coronary artery disease Other   . Rheum arthritis Mother   . Stroke Mother   . Hypertension Mother   . Heart disease Mother   . Cancer Mother   . Arthritis Mother   . Breast cancer Mother   . Heart attack Mother 58  . Glaucoma Other   . Diabetes Father   . Heart disease Father   . Hyperlipidemia Father   . Hypertension Father   . Heart attack Father 42  . Heart disease Maternal Grandmother   . Heart attack Maternal Grandmother 88  . Arthritis Maternal Grandmother   . Diabetes Paternal Grandmother   . Hypertension Paternal Grandmother   . Heart disease Paternal Grandmother   . Heart attack Paternal Grandmother 83  . Heart disease Paternal Grandfather   . Heart attack Paternal Grandfather 92  . Early death Maternal Grandfather        MVA      Current Outpatient Medications (Respiratory):  .  loratadine (CLARITIN) 10 MG tablet, Take  10 mg by mouth daily as needed for allergies.    Current Outpatient Medications (Other):  [name]  ELDERBERRY PO, Take 1 each by mouth daily. .  famotidine (PEPCID) 10 MG tablet, Take 10 mg by mouth daily as needed for heartburn or indigestion. .  Multiple Vitamins-Minerals (MULTIVITAMINS THER. W/MINERALS) TABS, Take 1 tablet by mouth daily.    Past medical history, social, surgical and family history all reviewed in electronic medical record.  No pertanent information unless stated regarding to the chief complaint.   Review of Systems:  No headache, visual changes, nausea, vomiting, diarrhea, constipation, dizziness, abdominal pain, skin rash, fevers, chills, night sweats, weight loss, swollen lymph nodes, body aches, joint swelling,  chest pain, shortness of breath, mood changes.  Positive muscle aches  Objective  Blood pressure 102/68, pulse 91, height 4\' 11"  (1.499 m), weight 126 lb (57.2 kg), SpO2 93 %.    General: No apparent distress alert and oriented x3 mood and affect normal, dressed appropriately.  HEENT: Pupils equal, extraocular movements intact  Respiratory: Patient's speak in full sentences and does not appear short of breath  Cardiovascular: No lower extremity edema, non tender, no erythema  Skin: Warm dry intact with no signs of infection or rash on extremities or on axial skeleton.  Abdomen: Soft nontender  Neuro: Cranial nerves II through XII are intact, neurovascularly intact in all extremities with 2+ DTRs and 2+ pulses.  Lymph: No lymphadenopathy of posterior or anterior cervical chain or axillae bilaterally.  Gait normal with good balance and coordination.  MSK:  Non tender with full range of motion and good stability and symmetric strength and tone of shoulders, elbows, wrist, hip, knee and ankles bilaterally.  Short stature Back Exam:  Inspection: Mild scoliosis Motion: Flexion 35 deg, Extension 45 deg, Side Bending to 45 deg bilaterally,  Rotation to 45 deg  bilaterally  SLR laying: Negative  XSLR laying: Negative  Palpable tenderness: Tender to palpation more in the cervical thoracic. FABER: negative. Sensory change: Gross sensation intact to all lumbar and sacral dermatomes.  Reflexes: 2+ at both patellar tendons, 2+ at achilles tendons, Babinski's downgoing.  Strength at foot  Plantar-flexion: 5/5 Dorsi-flexion: 5/5 Eversion: 5/5 Inversion: 5/5  Leg strength  Quad: 5/5 Hamstring: 5/5 Hip flexor: 5/5 Hip abductors: 5/5  Gait unremarkable.  Neck exam has some loss of lordosis.  Mild positive Spurling's on the right side but  very quickly dissipates patient states.  No weakness.  Full strength of the upper extremities.  Deep tendon reflexes intact  Osteopathic findings C2 flexed rotated and side bent right C4 flexed rotated and side bent left C6 flexed rotated and side bent left T3 extended rotated and side bent right inhaled third rib T9 extended rotated and side bent left L2 flexed rotated and side bent right Sacrum right on right\    Impression and Recommendations:     This case required medical decision making of moderate complexity. The above documentation has been reviewed and is accurate and complete Lyndal Pulley, DO       Note: This dictation was prepared with Dragon dictation along with smaller phrase technology. Any transcriptional errors that result from this process are unintentional.

## 2018-04-19 ENCOUNTER — Ambulatory Visit: Payer: No Typology Code available for payment source | Admitting: Family Medicine

## 2018-04-19 VITALS — BP 102/68 | HR 91 | Ht 59.0 in | Wt 126.0 lb

## 2018-04-19 DIAGNOSIS — M999 Biomechanical lesion, unspecified: Secondary | ICD-10-CM

## 2018-04-19 DIAGNOSIS — M5412 Radiculopathy, cervical region: Secondary | ICD-10-CM | POA: Diagnosis not present

## 2018-04-19 NOTE — Assessment & Plan Note (Signed)
Patient did have some neck pain today but nothing severe as what it was previously.  Discussed posture and ergonomics.  We discussed continuing to watch different movements and see how symptoms go.  Not doing gabapentin on a regular basis, does have a muscle relaxer and seems to help intermittently.  Will try to do this more at night.  Follow-up with me again in 6 weeks

## 2018-04-19 NOTE — Assessment & Plan Note (Signed)
Decision today to treat with OMT was based on Physical Exam  After verbal consent patient was treated with HVLA, ME, FPR techniques in cervical, thoracic, rib, lumbar and sacral areas  Patient tolerated the procedure well with improvement in symptoms  Patient given exercises, stretches and lifestyle modifications  See medications in patient instructions if given  Patient will follow up in 6 weeks 

## 2018-04-19 NOTE — Patient Instructions (Addendum)
Good to see you  Ice is your friend Stay active See me again in 6 weeks

## 2018-06-05 ENCOUNTER — Ambulatory Visit: Payer: No Typology Code available for payment source | Admitting: Family Medicine

## 2018-07-23 ENCOUNTER — Encounter: Payer: Self-pay | Admitting: Family Medicine

## 2018-08-21 ENCOUNTER — Other Ambulatory Visit: Payer: Self-pay

## 2018-08-21 ENCOUNTER — Encounter: Payer: Self-pay | Admitting: Family Medicine

## 2018-08-21 ENCOUNTER — Ambulatory Visit: Payer: No Typology Code available for payment source | Admitting: Family Medicine

## 2018-08-21 VITALS — BP 110/84 | HR 87 | Ht 59.0 in | Wt 124.0 lb

## 2018-08-21 DIAGNOSIS — M5412 Radiculopathy, cervical region: Secondary | ICD-10-CM

## 2018-08-21 DIAGNOSIS — M999 Biomechanical lesion, unspecified: Secondary | ICD-10-CM

## 2018-08-21 NOTE — Assessment & Plan Note (Signed)
We discussed radicular symptoms.  We discussed which activities to do.  Patient knows if any worsening symptoms come back sooner but otherwise 4 to 6 weeks.  Responded with manipulation.

## 2018-08-21 NOTE — Patient Instructions (Signed)
Muscle relaxer as need  See me again in 3 weeks

## 2018-08-21 NOTE — Progress Notes (Signed)
[name] [address] [place], [place] [address] Phone: [phone] Subjective:   I [name] am serving as a Education administrator for Dr. Hulan Saas.   CC: Neck pain  [id number]  [name] is a 48 y.o. female coming in with complaint of neck pain. States her pain is terrible on the right side. Pain has been waking her up at night.  Radiation of the pain.  States that it is fairly severe.  Patient denies any fevers chills any abnormal weight loss.  Denies any recent dizziness.  Denies any weakness in any of the extremities.    Past Medical History:  Diagnosis Date  . Arthritis   . Endometriosis   . Family history of breast cancer in mother    at age 71  . GAD (generalized anxiety disorder)    lexapro helped 2017 (Dr. Hoy Finlay did have extra stress of the Master's degree program on her at that time.  Got counseling.      [name] GERD (gastroesophageal reflux disease)   . Gestational diabetes   . Kidney stones   . Neck pain    and left shoulder pain: managed by osteopathic manipulation by Dr. Charlann Boxer.  [name] PAC (premature atrial contraction)    Past Surgical History:  Procedure Laterality Date  . ABDOMINAL EXPLORATION SURGERY  2009  . SALPINGECTOMY Right 2009  . TONSILLECTOMY    . TUBAL LIGATION Left 2009   Social History   Socioeconomic History  . Marital status: Married    Spouse name: Not on file  . Number of children: 3  . Years of education: Not on file  . Highest education level: Not on file  Occupational History  . 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 Financial resource strain: Not on file  . Food insecurity    Worry: Not on file    Inability: Not on file  . Transportation needs    Medical: Not on file    Non-medical: Not on file  Tobacco Use  . Smoking status: Never Smoker  . Smokeless tobacco: Never Used  Substance and Sexual Activity  . Alcohol use: Yes   Comment: Occasionally  . Drug use: No  . Sexual activity: Yes    Partners: Male    Birth control/protection: Surgical  Lifestyle  . Physical activity    Days per week: Not on file    Minutes per session: Not on file  . Stress: Not on file  Relationships  . Social Herbalist on phone: Not on file    Gets together: Not on file    Attends religious service: Not on file    Active member of club or organization: Not on file    Attends meetings of clubs or organizations: Not on file    Relationship status: Not on file  Other Topics Concern  . Not on file  Social History Narrative   Married, 3 kids.    Lives in [place].   Educ: Masters deg nursing.   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).   No tob/drugs.   Alc: social.   Allergies  Allergen Reactions  . Latex Other (See Comments)    Sensitivity only  . Sulfa Antibiotics Hives  . Sulfamethoxazole-Trimethoprim Rash  . Septra [Bactrim] Rash  Family History  Problem Relation Age of Onset  . Coronary artery disease Other   . Rheum arthritis Mother   . Stroke Mother   . Hypertension Mother   . Heart disease Mother   . Cancer Mother   . Arthritis Mother   . Breast cancer Mother   . Heart attack Mother 63  . Glaucoma Other   . Diabetes Father   . Heart disease Father   . Hyperlipidemia Father   . Hypertension Father   . Heart attack Father 88  . Heart disease Maternal Grandmother   . Heart attack Maternal Grandmother 88  . Arthritis Maternal Grandmother   . Diabetes Paternal Grandmother   . Hypertension Paternal Grandmother   . Heart disease Paternal Grandmother   . Heart attack Paternal Grandmother 2  . Heart disease Paternal Grandfather   . Heart attack Paternal Grandfather 92  . Early death Maternal Grandfather        MVA      Current Outpatient Medications (Respiratory):  .  loratadine (CLARITIN) 10 MG tablet, Take 10 mg by  mouth daily as needed for allergies.    Current Outpatient Medications (Other):  [name]  ELDERBERRY PO, Take 1 each by mouth daily. .  famotidine (PEPCID) 10 MG tablet, Take 10 mg by mouth daily as needed for heartburn or indigestion. .  Multiple Vitamins-Minerals (MULTIVITAMINS THER. W/MINERALS) TABS, Take 1 tablet by mouth daily.    Past medical history, social, surgical and family history all reviewed in electronic medical record.  No pertanent information unless stated regarding to the chief complaint.   Review of Systems:  No headache, visual changes, nausea, vomiting, diarrhea, constipation, dizziness, abdominal pain, skin rash, fevers, chills, night sweats, weight loss, swollen lymph nodes, body aches, joint swelling,  chest pain, shortness of breath, mood changes.  The muscle aches  Objective  Blood pressure 110/84, pulse 87, height 4\' 11"  (1.499 m), weight 124 lb (56.2 kg), SpO2 97 %.   General: No apparent distress alert and oriented x3 mood and affect normal, dressed appropriately.  HEENT: Pupils equal, extraocular movements intact  Respiratory: Patient's speak in full sentences and does not appear short of breath  Cardiovascular: No lower extremity edema, non tender, no erythema  Skin: Warm dry intact with no signs of infection or rash on extremities or on axial skeleton.  Abdomen: Soft nontender  Neuro: Cranial nerves II through XII are intact, neurovascularly intact in all extremities with 2+ DTRs and 2+ pulses.  Lymph: No lymphadenopathy of posterior or anterior cervical chain or axillae bilaterally.  Gait normal with good balance and coordination.  MSK:  Non tender with full range of motion and good stability and symmetric strength and tone of shoulders, elbows, wrist, hip, knee and ankles bilaterally.  Mild hypermobility  Neck  No gross deformity, swelling, bruising. Tightness noted  TTP .  No midline/bony TTP. LROM in sidebending, and extension 10 degrees  BUE strength  4/5.   Sensation intact to light touch.   2+ equal reflexes in triceps, biceps, brachioradialis tendons. Negative spurlings. NV intact distal BUEs.   Osteopathic findingst C4 flexed rotated and side bent left C6 flexed rotated and side bent left T3 extended rotated and side bent right inhaled third rib T7 extended rotated and side bent left L2 flexed rotated and side bent right Sacrum right on right     Impression and Recommendations:     This case required medical decision making of moderate complexity. The above documentation has been  reviewed and is accurate and complete Lyndal Pulley, DO       Note: This dictation was prepared with Dragon dictation along with smaller phrase technology. Any transcriptional errors that result from this process are unintentional.

## 2018-08-21 NOTE — Assessment & Plan Note (Signed)
Decision today to treat with OMT was based on Physical Exam  After verbal consent patient was treated with HVLA, ME, FPR techniques in cervical, thoracic, rib lumbar and sacral areas  Patient tolerated the procedure well with improvement in symptoms  Patient given exercises, stretches and lifestyle modifications  See medications in patient instructions if given  Patient will follow up in 4-8 weeks 

## 2018-08-27 ENCOUNTER — Encounter: Payer: Self-pay | Admitting: Family Medicine

## 2018-08-27 ENCOUNTER — Other Ambulatory Visit: Payer: Self-pay

## 2018-08-27 MED ORDER — TIZANIDINE HCL 4 MG PO TABS: 4.0000 mg | ORAL_TABLET | Freq: Every day | ORAL | 0 refills | Status: DC

## 2018-08-27 MED ORDER — GABAPENTIN 100 MG PO CAPS: 200.0000 mg | ORAL_CAPSULE | Freq: Every day | ORAL | 0 refills | Status: DC

## 2018-08-27 MED FILL — GABAPENTIN 100 MG CAPSULE: 100 | 90 days supply | Qty: 180 | Fill #0

## 2018-08-27 MED FILL — tiZANidine HCL 4 MG TABS: 4 | 30 days supply | Qty: 30 | Fill #0

## 2018-09-04 ENCOUNTER — Encounter: Payer: Self-pay | Admitting: Family Medicine

## 2018-09-13 ENCOUNTER — Encounter: Payer: Self-pay | Admitting: Family Medicine

## 2018-09-13 ENCOUNTER — Other Ambulatory Visit: Payer: Self-pay

## 2018-09-13 ENCOUNTER — Ambulatory Visit: Payer: No Typology Code available for payment source | Admitting: Family Medicine

## 2018-09-13 VITALS — BP 110/68 | HR 89 | Ht 59.0 in | Wt 123.0 lb

## 2018-09-13 DIAGNOSIS — G8929 Other chronic pain: Secondary | ICD-10-CM | POA: Diagnosis not present

## 2018-09-13 DIAGNOSIS — M5441 Lumbago with sciatica, right side: Secondary | ICD-10-CM

## 2018-09-13 DIAGNOSIS — M999 Biomechanical lesion, unspecified: Secondary | ICD-10-CM

## 2018-09-13 NOTE — Assessment & Plan Note (Signed)
Decision today to treat with OMT was based on Physical Exam  After verbal consent patient was treated with HVLA, ME, FPR techniques in cervical, thoracic, rib, lumbar and sacral areas  Patient tolerated the procedure well with improvement in symptoms  Patient given exercises, stretches and lifestyle modifications  See medications in patient instructions if given  Patient will follow up in 4-6 weeks 

## 2018-09-13 NOTE — Assessment & Plan Note (Signed)
Stable. Discussed HEP, discussed which activities.   Discussed posture

## 2018-09-13 NOTE — Progress Notes (Signed)
[name] [place], [place] [address] Phone: [phone] Subjective:   [name], am serving as a scribe for Dr. Hulan Saas.    CC: Back pain follow-up  [id number]  [name] is a 48 y.o. female coming in with complaint of back pain follow-up.  Patient has been seen multiple times for this.  Was having significant difficulty as well as with neck pain recently.  Patient was doing better overall.  Feels like she is making progress.  Started more range of motion exercises.  Discussed icing regimen.  Has been doing it on a more regular basis.     Past Medical History:  Diagnosis Date  . Arthritis   . Endometriosis   . Family history of breast cancer in mother    at age 60  . GAD (generalized anxiety disorder)    lexapro helped 2017 (Dr. Hoy Finlay did have extra stress of the Master's degree program on her at that time.  Got counseling.      [name] GERD (gastroesophageal reflux disease)   . Gestational diabetes   . Kidney stones   . Neck pain    and left shoulder pain: managed by osteopathic manipulation by Dr. Charlann Boxer. ?cervical radiculopathy  . PAC (premature atrial contraction)    Past Surgical History:  Procedure Laterality Date  . ABDOMINAL EXPLORATION SURGERY  2009  . SALPINGECTOMY Right 2009  . TONSILLECTOMY    . TUBAL LIGATION Left 2009   Social History   Socioeconomic History  . Marital status: Married    Spouse name: Not on file  . Number of children: 3  . Years of education: Not on file  . Highest education level: Not on file  Occupational History  . 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 Financial resource strain: Not on file  . Food insecurity    Worry: Not on file    Inability: Not on file  . Transportation needs    Medical: Not on file    Non-medical: Not on file  Tobacco Use  . Smoking status: Never Smoker  . Smokeless tobacco: Never  Used  Substance and Sexual Activity  . Alcohol use: Yes    Comment: Occasionally  . Drug use: Palmer  . Sexual activity: Yes    Partners: Male    Birth control/protection: Surgical  Lifestyle  . Physical activity    Days per week: Not on file    Minutes per session: Not on file  . Stress: Not on file  Relationships  . Social Herbalist on phone: Not on file    Gets together: Not on file    Attends religious service: Not on file    Active member of club or organization: Not on file    Attends meetings of clubs or organizations: Not on file    Relationship status: Not on file  Other Topics Concern  . Not on file  Social History Narrative   Married, 3 kids.    Lives in [place].   Educ: Masters deg nursing.   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   Palmer tob/drugs.   Alc: social.   Allergies  Allergen Reactions  . Latex Other (See Comments)    Sensitivity only  . Sulfa Antibiotics Hives  .  Sulfamethoxazole-Trimethoprim Rash  . Septra [Bactrim] Rash   Family History  Problem Relation Age of Onset  . Coronary artery disease Other   . Rheum arthritis Mother   . Stroke Mother   . Hypertension Mother   . Heart disease Mother   . Cancer Mother   . Arthritis Mother   . Breast cancer Mother   . Heart attack Mother 62  . Glaucoma Other   . Diabetes Father   . Heart disease Father   . Hyperlipidemia Father   . Hypertension Father   . Heart attack Father 7  . Heart disease Maternal Grandmother   . Heart attack Maternal Grandmother 88  . Arthritis Maternal Grandmother   . Diabetes Paternal Grandmother   . Hypertension Paternal Grandmother   . Heart disease Paternal Grandmother   . Heart attack Paternal Grandmother 70  . Heart disease Paternal Grandfather   . Heart attack Paternal Grandfather 92  . Early death Maternal Grandfather        MVA      Current Outpatient Medications  (Respiratory):  .  loratadine (CLARITIN) 10 MG tablet, Take 10 mg by mouth daily as needed for allergies.    Current Outpatient Medications (Other):  [name]  ELDERBERRY PO, Take 1 each by mouth daily. .  famotidine (PEPCID) 10 MG tablet, Take 10 mg by mouth daily as needed for heartburn or indigestion. .  gabapentin (NEURONTIN) 100 MG capsule, Take 2 capsules (200 mg total) by mouth at bedtime. .  Multiple Vitamins-Minerals (MULTIVITAMINS THER. W/MINERALS) TABS, Take 1 tablet by mouth daily. [name]  tiZANidine (ZANAFLEX) 4 MG tablet, Take 1 tablet (4 mg total) by mouth at bedtime.    Past medical history, social, surgical and family history all reviewed in electronic medical record.  Palmer pertanent information unless stated regarding to the chief complaint.   Review of Systems:  Palmer headache, visual changes, nausea, vomiting, diarrhea, constipation, dizziness, abdominal pain, skin rash, fevers, chills, night sweats, weight loss, swollen lymph nodes, body aches, joint swelling,  chest pain, shortness of breath, mood changes.  Positive muscle aches  Objective  Blood pressure 110/68, pulse 89, height 4\' 11"  (1.499 m), weight 123 lb (55.8 kg), SpO2 99 %.    General: Palmer apparent distress alert and oriented x3 mood and affect normal, dressed appropriately.  HEENT: Pupils equal, extraocular movements intact  Respiratory: Patient's speak in full sentences and does not appear short of breath  Cardiovascular: Palmer lower extremity edema, non tender, Palmer erythema  Skin: Warm dry intact with Palmer signs of infection or rash on extremities or on axial skeleton.  Abdomen: Soft nontender  Neuro: Cranial nerves II through XII are intact, neurovascularly intact in all extremities with 2+ DTRs and 2+ pulses.  Lymph: Palmer lymphadenopathy of posterior or anterior cervical chain or axillae bilaterally.  Gait normal with good balance and coordination.  MSK:  Non tender with full range of motion and good stability and symmetric  strength and tone of shoulders, elbows, wrist, hip, knee and ankles bilaterally.  Back Exam:  Inspection: Loss of lordosis scoliosis noted Motion: Flexion 45 deg, Extension 25 deg, Side Bending to 35 deg bilaterally,  Rotation to 45 deg bilaterally  SLR laying: Negative  XSLR laying: Negative  Palpable tenderness: Tender to palpation of paraspinal musculature lumbar spine right greater than left. FABER: negative. Sensory change: Gross sensation intact to all lumbar and sacral dermatomes.  Reflexes: 2+ at both patellar tendons, 2+ at achilles tendons, Babinski's downgoing.  Strength at  foot  Plantar-flexion: 5/5 Dorsi-flexion: 5/5 Eversion: 5/5 Inversion: 5/5  Leg strength  Quad: 5/5 Hamstring: 5/5 Hip flexor: 5/5 Hip abductors: 5/5  Gait unremarkable.  Osteopathic findings  C2 flexed rotated and side bent right C7 flexed rotated and side bent left T3 extended rotated and side bent right inhaled third rib T7 extended rotated and side bent left L2 flexed rotated and side bent right Sacrum right on right     Impression and Recommendations:     This case required medical decision making of moderate complexity. The above documentation has been reviewed and is accurate and complete Linda Pulley, DO       Note: This dictation was prepared with Dragon dictation along with smaller phrase technology. Any transcriptional errors that result from this process are unintentional.

## 2018-09-17 ENCOUNTER — Encounter: Payer: Self-pay | Admitting: Family Medicine

## 2018-09-18 LAB — HM MAMMOGRAPHY

## 2018-09-20 ENCOUNTER — Encounter: Payer: Self-pay | Admitting: Family Medicine

## 2018-10-28 NOTE — Assessment & Plan Note (Signed)
Stable but can have exacerbations.  I do believe that patient not being able to workout as regularly and increasing anxiety has contributed to some of the discomfort and pain.  Does respond well to osteopathic manipulation.  Follow-up again in 4 to 8 weeks

## 2018-10-28 NOTE — Assessment & Plan Note (Signed)
Decision today to treat with OMT was based on Physical Exam  After verbal consent patient was treated with HVLA, ME, FPR techniques in cervical, thoracic, rib,  lumbar and sacral areas  Patient tolerated the procedure well with improvement in symptoms  Patient given exercises, stretches and lifestyle modifications  See medications in patient instructions if given  Patient will follow up in 4-8 weeks 

## 2018-10-28 NOTE — Progress Notes (Signed)
[name] [place], [place] [address] Phone: [phone] Subjective:   I [name] am serving as a Education administrator for [name].    CC: Back pain follow-up  [id number]  [name] is a 48 y.o. female coming in with complaint of neck pain. States she is feeling much better with slight tightness today.  States overall seems to be doing somewhat better at this time now.     Past Medical History:  Diagnosis Date  . Arthritis   . Endometriosis   . Family history of breast cancer in mother    at age 13  . GAD (generalized anxiety disorder)    lexapro helped 2017 ([name] did have extra stress of the Master's degree program on her at that time.  Got counseling.      [name] GERD (gastroesophageal reflux disease)   . Gestational diabetes   . Kidney stones   . Low back pain    Dr. Charlann Boxer.  . Neck pain    and left shoulder pain: managed by osteopathic manipulation by Dr. Charlann Boxer. ?cervical radiculopathy  . PAC (premature atrial contraction)    Past Surgical History:  Procedure Laterality Date  . ABDOMINAL EXPLORATION SURGERY  2009  . SALPINGECTOMY Right 2009  . TONSILLECTOMY    . TUBAL LIGATION Left 2009   Social History   Socioeconomic History  . Marital status: Married    Spouse name: Not on file  . Number of children: 3  . Years of education: Not on file  . Highest education level: Not on file  Occupational History  . 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 Financial resource strain: Not on file  . Food insecurity    Worry: Not on file    Inability: Not on file  . Transportation needs    Medical: Not on file    Non-medical: Not on file  Tobacco Use  . Smoking status: Never Smoker  . Smokeless tobacco: Never Used  Substance and Sexual Activity  . Alcohol use: Yes    Comment: Occasionally  . Drug use: No  . Sexual activity: Yes    Partners:  Male    Birth control/protection: Surgical  Lifestyle  . Physical activity    Days per week: Not on file    Minutes per session: Not on file  . Stress: Not on file  Relationships  . Social Herbalist on phone: Not on file    Gets together: Not on file    Attends religious service: Not on file    Active member of club or organization: Not on file    Attends meetings of clubs or organizations: Not on file    Relationship status: Not on file  Other Topics Concern  . Not on file  Social History Narrative   Married, 3 kids.    Lives in [place].   Educ: Masters deg nursing.   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   No tob/drugs.   Alc: social.   Allergies  Allergen Reactions  . Latex Other (See Comments)    Sensitivity only  . Sulfa Antibiotics Hives  . Sulfamethoxazole-Trimethoprim Rash  . Septra [Bactrim] Rash   Family History  Problem Relation Age of Onset  . Coronary artery  disease Other   . Rheum arthritis Mother   . Stroke Mother   . Hypertension Mother   . Heart disease Mother   . Cancer Mother   . Arthritis Mother   . Breast cancer Mother   . Heart attack Mother 9  . Glaucoma Other   . Diabetes Father   . Heart disease Father   . Hyperlipidemia Father   . Hypertension Father   . Heart attack Father 28  . Heart disease Maternal Grandmother   . Heart attack Maternal Grandmother 88  . Arthritis Maternal Grandmother   . Diabetes Paternal Grandmother   . Hypertension Paternal Grandmother   . Heart disease Paternal Grandmother   . Heart attack Paternal Grandmother 55  . Heart disease Paternal Grandfather   . Heart attack Paternal Grandfather 92  . Early death Maternal Grandfather        MVA      Current Outpatient Medications (Respiratory):  .  loratadine (CLARITIN) 10 MG tablet, Take 10 mg by mouth daily as needed for allergies.    Current Outpatient Medications  (Other):  [name]  ELDERBERRY PO, Take 1 each by mouth daily. .  famotidine (PEPCID) 10 MG tablet, Take 10 mg by mouth daily as needed for heartburn or indigestion. .  gabapentin (NEURONTIN) 100 MG capsule, Take 2 capsules (200 mg total) by mouth at bedtime. .  Multiple Vitamins-Minerals (MULTIVITAMINS THER. W/MINERALS) TABS, Take 1 tablet by mouth daily. [name]  tiZANidine (ZANAFLEX) 4 MG tablet, Take 1 tablet (4 mg total) by mouth at bedtime.    Past medical history, social, surgical and family history all reviewed in electronic medical record.  No pertanent information unless stated regarding to the chief complaint.   Review of Systems:  No headache, visual changes, nausea, vomiting, diarrhea, constipation, dizziness, abdominal pain, skin rash, fevers, chills, night sweats, weight loss, swollen lymph nodes, body aches, joint swelling, chest pain, shortness of breath, mood changes.  Positive muscle aches  Objective  Blood pressure 100/60, pulse 85, height 4\' 11"  (1.499 m), weight 120 lb (54.4 kg), SpO2 98 %.    General: No apparent distress alert and oriented x3 mood and affect normal, dressed appropriately.  HEENT: Pupils equal, extraocular movements intact  Respiratory: Patient's speak in full sentences and does not appear short of breath  Cardiovascular: No lower extremity edema, non tender, no erythema  Skin: Warm dry intact with no signs of infection or rash on extremities or on axial skeleton.  Abdomen: Soft nontender  Neuro: Cranial nerves II through XII are intact, neurovascularly intact in all extremities with 2+ DTRs and 2+ pulses.  Lymph: No lymphadenopathy of posterior or anterior cervical chain or axillae bilaterally.  Gait normal with good balance and coordination.  MSK:  Non tender with full range of motion and good stability and symmetric strength and tone of shoulders, elbows, wrist, hip, knee and ankles bilaterally.  Back exam does have some scoliosis.  Tender to palpation in the  paraspinal musculature right greater than left mostly in the lumbosacral areas.  Mild parascapular tightness more left greater than right.  Osteopathic findings C2 flexed rotated and side bent right T3 extended rotated and side bent left inhaled third rib T6 extended rotated and side bent left L2 flexed rotated and side bent right Sacrum right on right    Impression and Recommendations:     This case required medical decision making of moderate complexity. The above documentation has been reviewed and is accurate and complete Olevia Bowens  Tamala Julian, DO       Note: This dictation was prepared with Dragon dictation along with smaller phrase technology. Any transcriptional errors that result from this process are unintentional.

## 2018-10-30 ENCOUNTER — Other Ambulatory Visit: Payer: Self-pay

## 2018-10-30 ENCOUNTER — Encounter: Payer: Self-pay | Admitting: Family Medicine

## 2018-10-30 ENCOUNTER — Ambulatory Visit: Payer: No Typology Code available for payment source | Admitting: Family Medicine

## 2018-10-30 VITALS — BP 100/60 | HR 85 | Ht 59.0 in | Wt 120.0 lb

## 2018-10-30 DIAGNOSIS — M999 Biomechanical lesion, unspecified: Secondary | ICD-10-CM | POA: Diagnosis not present

## 2018-10-30 DIAGNOSIS — G8929 Other chronic pain: Secondary | ICD-10-CM | POA: Diagnosis not present

## 2018-10-30 DIAGNOSIS — M5441 Lumbago with sciatica, right side: Secondary | ICD-10-CM | POA: Diagnosis not present

## 2018-12-18 NOTE — Progress Notes (Signed)
[name] [place], [place] [address] Phone: [phone] Subjective:   I [name] am serving as a Education administrator for Dr. Hulan Saas.     CC: Low back and neck pain follow-up  [id number]  [name] is a 48 y.o. female coming in with complaint of back pain. States she is feeling about the same. More pain in her hip the past couple of weeks.   Discussed with patient in great length about icing regimen, home exercise, which activities to do which wants to avoid.  Patient is to increase activity slowly over the course of next several weeks and she has been doing it.  Some increasing stress though recently has had patient having more discomfort as well.     Past Medical History:  Diagnosis Date  . Arthritis   . Endometriosis   . Family history of breast cancer in mother    at age 71  . GAD (generalized anxiety disorder)    lexapro helped 2017 (Dr. Hoy Finlay did have extra stress of the Master's degree program on her at that time.  Got counseling.      [name] GERD (gastroesophageal reflux disease)   . Gestational diabetes   . Kidney stones   . Low back pain    Dr. Charlann Boxer.  . Neck pain    and left shoulder pain: managed by osteopathic manipulation by Dr. Charlann Boxer. ?cervical radiculopathy  . PAC (premature atrial contraction)    Past Surgical History:  Procedure Laterality Date  . ABDOMINAL EXPLORATION SURGERY  2009  . SALPINGECTOMY Right 2009  . TONSILLECTOMY    . TUBAL LIGATION Left 2009   Social History   Socioeconomic History  . Marital status: Married    Spouse name: Not on file  . Number of children: 3  . Years of education: Not on file  . Highest education level: Not on file  Occupational History  . 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 Financial resource strain: Not on file  . Food insecurity    Worry: Not on file    Inability: Not on file  .  Transportation needs    Medical: Not on file    Non-medical: Not on file  Tobacco Use  . Smoking status: Never Smoker  . Smokeless tobacco: Never Used  Substance and Sexual Activity  . Alcohol use: Yes    Comment: Occasionally  . Drug use: No  . Sexual activity: Yes    Partners: Male    Birth control/protection: Surgical  Lifestyle  . Physical activity    Days per week: Not on file    Minutes per session: Not on file  . Stress: Not on file  Relationships  . Social Herbalist on phone: Not on file    Gets together: Not on file    Attends religious service: Not on file    Active member of club or organization: Not on file    Attends meetings of clubs or organizations: Not on file    Relationship status: Not on file  Other Topics Concern  . Not on file  Social History Narrative   Married, 3 kids.    Lives in [place].   Educ: Masters deg nursing.   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  No tob/drugs.   Alc: social.   Allergies  Allergen Reactions  . Latex Other (See Comments)    Sensitivity only  . Sulfa Antibiotics Hives  . Sulfamethoxazole-Trimethoprim Rash  . Septra [Bactrim] Rash   Family History  Problem Relation Age of Onset  . Coronary artery disease Other   . Rheum arthritis Mother   . Stroke Mother   . Hypertension Mother   . Heart disease Mother   . Cancer Mother   . Arthritis Mother   . Breast cancer Mother   . Heart attack Mother 64  . Glaucoma Other   . Diabetes Father   . Heart disease Father   . Hyperlipidemia Father   . Hypertension Father   . Heart attack Father 46  . Heart disease Maternal Grandmother   . Heart attack Maternal Grandmother 88  . Arthritis Maternal Grandmother   . Diabetes Paternal Grandmother   . Hypertension Paternal Grandmother   . Heart disease Paternal Grandmother   . Heart attack Paternal Grandmother 56  . Heart disease Paternal  Grandfather   . Heart attack Paternal Grandfather 92  . Early death Maternal Grandfather        MVA      Current Outpatient Medications (Respiratory):  .  loratadine (CLARITIN) 10 MG tablet, Take 10 mg by mouth daily as needed for allergies.    Current Outpatient Medications (Other):  [name]  ELDERBERRY PO, Take 1 each by mouth daily. .  famotidine (PEPCID) 10 MG tablet, Take 10 mg by mouth daily as needed for heartburn or indigestion. .  gabapentin (NEURONTIN) 100 MG capsule, Take 2 capsules (200 mg total) by mouth at bedtime. .  Multiple Vitamins-Minerals (MULTIVITAMINS THER. W/MINERALS) TABS, Take 1 tablet by mouth daily. [name]  tiZANidine (ZANAFLEX) 4 MG tablet, Take 1 tablet (4 mg total) by mouth at bedtime.    Past medical history, social, surgical and family history all reviewed in electronic medical record.  No pertanent information unless stated regarding to the chief complaint.   Review of Systems:  No headache, visual changes, nausea, vomiting, diarrhea, constipation, dizziness, abdominal pain, skin rash, fevers, chills, night sweats, weight loss, swollen lymph nodes, body aches, joint swelling,  chest pain, shortness of breath, mood changes.  Positive muscle aches  Objective  Blood pressure 136/74, pulse 98, height 4\' 11"  (1.499 m), weight 122 lb (55.3 kg), SpO2 98 %.   General: No apparent distress alert and oriented x3 mood and affect normal, dressed appropriately.  HEENT: Pupils equal, extraocular movements intact  Respiratory: Patient's speak in full sentences and does not appear short of breath  Cardiovascular: Trace lower extremity edema, non tender, no erythema  Skin: Warm dry intact with no signs of infection or rash on extremities or on axial skeleton.  Abdomen: Soft nontender  Neuro: Cranial nerves II through XII are intact, neurovascularly intact in all extremities with 2+ DTRs and 2+ pulses.  Lymph: No lymphadenopathy of posterior or anterior cervical chain or  axillae bilaterally.  Gait normal with good balance and coordination.  MSK:  Non tender with full range of motion and good stability and symmetric strength and tone of shoulders, elbows, wrist, hip, knee and ankles bilaterally.  Back exam does have some mild scoliosis noted.  Patient is some tender to palpation in the thoracolumbar lumbosacral areas right greater than left.  Mild tightness of the neck but negative Spurling's.  5 out of 5 strength of the upper extremity bilaterally.  Osteopathic findings  C6 flexed rotated  and side bent left T3 extended rotated and side bent right inhaled third rib T7 extended rotated and side bent left L2 flexed rotated and side bent right Sacrum right on right     Impression and Recommendations:     This case required medical decision making of moderate complexity. The above documentation has been reviewed and is accurate and complete Lyndal Pulley, DO       Note: This dictation was prepared with Dragon dictation along with smaller phrase technology. Any transcriptional errors that result from this process are unintentional.

## 2018-12-19 ENCOUNTER — Ambulatory Visit: Payer: No Typology Code available for payment source | Admitting: Family Medicine

## 2018-12-19 ENCOUNTER — Other Ambulatory Visit: Payer: Self-pay

## 2018-12-19 ENCOUNTER — Encounter: Payer: Self-pay | Admitting: Family Medicine

## 2018-12-19 VITALS — BP 136/74 | HR 98 | Ht 59.0 in | Wt 122.0 lb

## 2018-12-19 DIAGNOSIS — M999 Biomechanical lesion, unspecified: Secondary | ICD-10-CM

## 2018-12-19 DIAGNOSIS — G8929 Other chronic pain: Secondary | ICD-10-CM

## 2018-12-19 DIAGNOSIS — M5441 Lumbago with sciatica, right side: Secondary | ICD-10-CM

## 2018-12-19 NOTE — Patient Instructions (Addendum)
Prilosec daily for 2 weeks Pepcid twice a day for 3 days See me again in 4 weeks

## 2018-12-19 NOTE — Assessment & Plan Note (Signed)
Increased tightness in the low back.  Discussed with patient in great length.  Patient does have some increasing stress that I think is contributing to some of the aches and pain and potential ulcer.  Patient is to increase activity slowly, encourage Prilosec, follow-up again in 4 to 8 weeks

## 2018-12-19 NOTE — Assessment & Plan Note (Signed)
Decision today to treat with OMT was based on Physical Exam  After verbal consent patient was treated with HVLA, ME, FPR techniques in cervical, rib, thoracic, lumbar and sacral areas  Patient tolerated the procedure well with improvement in symptoms  Patient given exercises, stretches and lifestyle modifications  See medications in patient instructions if given  Patient will follow up in 4-6 weeks

## 2019-01-23 ENCOUNTER — Other Ambulatory Visit: Payer: Self-pay

## 2019-01-23 ENCOUNTER — Ambulatory Visit: Payer: No Typology Code available for payment source | Admitting: Family Medicine

## 2019-01-23 ENCOUNTER — Encounter: Payer: Self-pay | Admitting: Family Medicine

## 2019-01-23 ENCOUNTER — Ambulatory Visit: Payer: Self-pay

## 2019-01-23 VITALS — BP 110/76 | HR 84 | Ht 59.0 in | Wt 121.0 lb

## 2019-01-23 DIAGNOSIS — M25522 Pain in left elbow: Secondary | ICD-10-CM | POA: Diagnosis not present

## 2019-01-23 DIAGNOSIS — M5441 Lumbago with sciatica, right side: Secondary | ICD-10-CM

## 2019-01-23 DIAGNOSIS — M999 Biomechanical lesion, unspecified: Secondary | ICD-10-CM

## 2019-01-23 DIAGNOSIS — G8929 Other chronic pain: Secondary | ICD-10-CM

## 2019-01-23 DIAGNOSIS — S5002XA Contusion of left elbow, initial encounter: Secondary | ICD-10-CM

## 2019-01-23 NOTE — Assessment & Plan Note (Signed)
Back pain has some mild loss of lordosis.  Patient is some tenderness to palpation noted.  Patient has responded well to osteopathic manipulation and did have good movement noted today.  Discussed core strengthening and ergonomics throughout the day.  Patient will increase activity as tolerated.  Follow-up again in 4 to 8 weeks

## 2019-01-23 NOTE — Assessment & Plan Note (Signed)
Elbow contusion noted, discussed icing regimen, Arnica lotion over-the-counter, we discussed avoiding excessive overhead lifting.  Follow-up again 4 to 6 weeks

## 2019-01-23 NOTE — Progress Notes (Signed)
[name] [place], [place] [address] Phone: [phone] Subjective:   [name], am serving as a scribe for Dr. Hulan Saas. This visit occurred during the SARS-CoV-2 public health emergency.  Safety protocols were in place, including screening questions prior to the visit, additional usage of staff PPE, and extensive cleaning of exam room while observing appropriate contact time as indicated for disinfecting solutions.   CC: Elbow pain, back pain follow-up  [id number]  [name] is a 48 y.o. female coming in with complaint of back pain. Last seen on 12/19/2018 for OMT. Patient states that she is doing fine. Has good and bad days.  Overall seems to be doing relatively well.  Not stopping her from activity but very aware of her back on a regular basis.  Does feel that the medications taken for potential ulcer has been significantly improved of the upper back pain  Hit her elbow yesterday on door frame. Is able to move elbow but has sharp pain. Pain over lateral epicondyle.  States it is very tender over the area.  Maybe some mild bruising and maybe some mild swelling    Past Medical History:  Diagnosis Date  . Arthritis   . Endometriosis   . Family history of breast cancer in mother    at age 1  . GAD (generalized anxiety disorder)    lexapro helped 2017 (Dr. Hoy Finlay did have extra stress of the Master's degree program on her at that time.  Got counseling.      [name] GERD (gastroesophageal reflux disease)   . Gestational diabetes   . Kidney stones   . Low back pain    Dr. Charlann Boxer.  . Neck pain    and left shoulder pain: managed by osteopathic manipulation by Dr. Charlann Boxer. ?cervical radiculopathy  . PAC (premature atrial contraction)    Past Surgical History:  Procedure Laterality Date  . ABDOMINAL EXPLORATION SURGERY  2009  . SALPINGECTOMY Right 2009  . TONSILLECTOMY    . TUBAL LIGATION Left 2009   Social  History   Socioeconomic History  . Marital status: Married    Spouse name: Not on file  . Number of children: 3  . Years of education: Not on file  . Highest education level: Not on file  Occupational History  . 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 Financial resource strain: Not on file  . Food insecurity    Worry: Not on file    Inability: Not on file  . Transportation needs    Medical: Not on file    Non-medical: Not on file  Tobacco Use  . Smoking status: Never Smoker  . Smokeless tobacco: Never Used  Substance and Sexual Activity  . Alcohol use: Yes    Comment: Occasionally  . Drug use: No  . Sexual activity: Yes    Partners: Male    Birth control/protection: Surgical  Lifestyle  . Physical activity    Days per week: Not on file    Minutes per session: Not on file  . Stress: Not on file  Relationships  . Social Herbalist on phone: Not on file    Gets together: Not on file    Attends religious service: Not on file    Active member of club or organization: Not on file    Attends meetings of clubs  or organizations: Not on file    Relationship status: Not on file  Other Topics Concern  . Not on file  Social History Narrative   Married, 3 kids.    Lives in [place].   Educ: Masters deg nursing.   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   No tob/drugs.   Alc: social.   Allergies  Allergen Reactions  . Latex Other (See Comments)    Sensitivity only  . Sulfa Antibiotics Hives  . Sulfamethoxazole-Trimethoprim Rash  . Septra [Bactrim] Rash   Family History  Problem Relation Age of Onset  . Coronary artery disease Other   . Rheum arthritis Mother   . Stroke Mother   . Hypertension Mother   . Heart disease Mother   . Cancer Mother   . Arthritis Mother   . Breast cancer Mother   . Heart attack Mother 4  . Glaucoma  Other   . Diabetes Father   . Heart disease Father   . Hyperlipidemia Father   . Hypertension Father   . Heart attack Father 54  . Heart disease Maternal Grandmother   . Heart attack Maternal Grandmother 88  . Arthritis Maternal Grandmother   . Diabetes Paternal Grandmother   . Hypertension Paternal Grandmother   . Heart disease Paternal Grandmother   . Heart attack Paternal Grandmother 45  . Heart disease Paternal Grandfather   . Heart attack Paternal Grandfather 92  . Early death Maternal Grandfather        MVA      Current Outpatient Medications (Respiratory):  .  loratadine (CLARITIN) 10 MG tablet, Take 10 mg by mouth daily as needed for allergies.    Current Outpatient Medications (Other):  [name]  ELDERBERRY PO, Take 1 each by mouth daily. .  famotidine (PEPCID) 10 MG tablet, Take 10 mg by mouth daily as needed for heartburn or indigestion. .  gabapentin (NEURONTIN) 100 MG capsule, Take 2 capsules (200 mg total) by mouth at bedtime. .  Multiple Vitamins-Minerals (MULTIVITAMINS THER. W/MINERALS) TABS, Take 1 tablet by mouth daily. [name]  tiZANidine (ZANAFLEX) 4 MG tablet, Take 1 tablet (4 mg total) by mouth at bedtime.    Past medical history, social, surgical and family history all reviewed in electronic medical record.  No pertanent information unless stated regarding to the chief complaint.   Review of Systems:  No headache, visual changes, nausea, vomiting, diarrhea, constipation, dizziness, abdominal pain, skin rash, fevers, chills, night sweats, weight loss, swollen lymph nodes, body aches, joint swelling, muscle aches, chest pain, shortness of breath, mood changes.   Objective  Blood pressure 110/76, pulse 84, height 4\' 11"  (1.499 m), weight 121 lb (54.9 kg), SpO2 98 %.    General: No apparent distress alert and oriented x3 mood and affect normal, dressed appropriately.  HEENT: Pupils equal, extraocular movements intact  Respiratory: Patient's speak in full sentences  and does not appear short of breath  Cardiovascular: No lower extremity edema, non tender, no erythema  Skin: Warm dry intact with no signs of infection or rash on extremities or on axial skeleton.  Abdomen: Soft nontender  Neuro: Cranial nerves II through XII are intact, neurovascularly intact in all extremities with 2+ DTRs and 2+ pulses.  Lymph: No lymphadenopathy of posterior or anterior cervical chain or axillae bilaterally.  Gait normal with good balance and coordination.  MSK:  Non tender with full range of motion  and good stability and symmetric strength and tone of shoulders,  wrist, hip, knee and ankles bilaterally.  Left elbow exam does have a contusion noted over the lateral aspect.  Full range of motion.  Severely tender to palpation over the lateral epicondylar region.  Patient has full strength of the hand with good grip strength.  No swelling of the joint noted.  Back exam shows the patient does have some mild degenerative scoliosis of the lumbar spine.  Tightness in the thoracolumbar junction in the sacroiliac joint bilaterally.  Negative straight leg test noted today.  5 out of 5 strength of the lower extremities.  Osteopathic findings C2 flexed rotated and side bent right T3 extended rotated and side bent right inhaled third rib T7 extended rotated and side bent left L2 flexed rotated and side bent right Sacrum right on right     Impression and Recommendations:     This case required medical decision making of moderate complexity. The above documentation has been reviewed and is accurate and complete Lyndal Pulley, DO       Note: This dictation was prepared with Dragon dictation along with smaller phrase technology. Any transcriptional errors that result from this process are unintentional.

## 2019-01-23 NOTE — Assessment & Plan Note (Signed)
Decision today to treat with OMT was based on Physical Exam  After verbal consent patient was treated with HVLA, ME, FPR techniques in cervical, thoracic, rib lumbar and sacral areas  Patient tolerated the procedure well with improvement in symptoms  Patient given exercises, stretches and lifestyle modifications  See medications in patient instructions if given  Patient will follow up in 4-8 weeks 

## 2019-02-14 NOTE — Progress Notes (Deleted)
[facility] [address] [place] Phone: [phone] Subjective:    I'm seeing this patient by the request  of:    CC:   [id number]  [name] is a 48 y.o. female coming in with complaint of ***  Onset-  Location Duration-  Character- Aggravating factors- Reliving factors-  Therapies tried-  Severity-     Past Medical History:  Diagnosis Date  . Arthritis   . Endometriosis   . Family history of breast cancer in mother    at age 73  . GAD (generalized anxiety disorder)    lexapro helped 2017 (Dr. Hoy Finlay did have extra stress of the Master's degree program on her at that time.  Got counseling.      [name] GERD (gastroesophageal reflux disease)   . Gestational diabetes   . Kidney stones   . Low back pain    Dr. Charlann Boxer.  . Neck pain    and left shoulder pain: managed by osteopathic manipulation by Dr. Charlann Boxer. ?cervical radiculopathy  . PAC (premature atrial contraction)    Past Surgical History:  Procedure Laterality Date  . ABDOMINAL EXPLORATION SURGERY  2009  . SALPINGECTOMY Right 2009  . TONSILLECTOMY    . TUBAL LIGATION Left 2009   Social History   Socioeconomic History  . Marital status: Married    Spouse name: Not on file  . Number of children: 3  . Years of education: Not on file  . Highest education level: Not on file  Occupational History  . 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 Smoking status: Never Smoker  . Smokeless tobacco: Never Used  Substance and Sexual Activity  . Alcohol use: Yes    Comment: Occasionally  . Drug use: No  . Sexual activity: Yes    Partners: Male    Birth control/protection: Surgical  Other Topics Concern  . Not on file  Social History Narrative   Married, 3 kids.    Lives in [place].   Educ: Masters deg nursing.   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   No tob/drugs.   Alc: social.   Social Determinants of Health   Financial Resource Strain:   . Difficulty of Paying Living Expenses: Not on file  Food Insecurity:   . Worried About Charity fundraiser in the Last Year: Not on file  . Ran Out of Food in the Last Year: Not on file  Transportation Needs:   . Lack of Transportation (Medical): Not on file  . Lack of Transportation (Non-Medical): Not on file  Physical Activity:   . Days of Exercise per Week: Not on file  . Minutes of Exercise per Session: Not on file  Stress:   . Feeling of Stress : Not on file  Social Connections:   . Frequency of Communication with Friends and Family: Not on file  . Frequency of Social Gatherings with Friends and Family: Not on file  . Attends Religious Services: Not on file  . Active Member of Clubs or Organizations: Not on file  . Attends Archivist Meetings: Not on file  . Marital Status: Not on file   Allergies  Allergen Reactions  . Latex Other (See Comments)    Sensitivity only  . Sulfa Antibiotics Hives  .  Sulfamethoxazole-Trimethoprim Rash  . Septra [Bactrim] Rash   Family History  Problem Relation Age of Onset  . Coronary artery disease Other   . Rheum arthritis Mother   . Stroke Mother   . Hypertension Mother   . Heart disease Mother   . Cancer Mother   . Arthritis Mother   . Breast cancer Mother   . Heart attack Mother 49  . Glaucoma Other   . Diabetes Father   . Heart disease Father   . Hyperlipidemia Father   . Hypertension Father   . Heart attack Father 34  . Heart disease Maternal Grandmother   . Heart attack Maternal Grandmother 88  . Arthritis Maternal Grandmother   . Diabetes Paternal Grandmother   . Hypertension Paternal Grandmother   . Heart disease Paternal Grandmother   . Heart attack Paternal Grandmother 72  . Heart disease Paternal Grandfather   . Heart attack Paternal Grandfather 92  . Early death Maternal  Grandfather        MVA      Current Outpatient Medications (Respiratory):  .  loratadine (CLARITIN) 10 MG tablet, Take 10 mg by mouth daily as needed for allergies.    Current Outpatient Medications (Other):  [name]  ELDERBERRY PO, Take 1 each by mouth daily. .  famotidine (PEPCID) 10 MG tablet, Take 10 mg by mouth daily as needed for heartburn or indigestion. .  gabapentin (NEURONTIN) 100 MG capsule, Take 2 capsules (200 mg total) by mouth at bedtime. .  Multiple Vitamins-Minerals (MULTIVITAMINS THER. W/MINERALS) TABS, Take 1 tablet by mouth daily. [name]  tiZANidine (ZANAFLEX) 4 MG tablet, Take 1 tablet (4 mg total) by mouth at bedtime.    Past medical history, social, surgical and family history all reviewed in electronic medical record.  No pertanent information unless stated regarding to the chief complaint.   Review of Systems:  No headache, visual changes, nausea, vomiting, diarrhea, constipation, dizziness, abdominal pain, skin rash, fevers, chills, night sweats, weight loss, swollen lymph nodes, body aches, joint swelling, muscle aches, chest pain, shortness of breath, mood changes.   Objective  There were no vitals taken for this visit. Systems examined below as of    General: No apparent distress alert and oriented x3 mood and affect normal, dressed appropriately.  HEENT: Pupils equal, extraocular movements intact  Respiratory: Patient's speak in full sentences and does not appear short of breath  Cardiovascular: No lower extremity edema, non tender, no erythema  Skin: Warm dry intact with no signs of infection or rash on extremities or on axial skeleton.  Abdomen: Soft nontender  Neuro: Cranial nerves II through XII are intact, neurovascularly intact in all extremities with 2+ DTRs and 2+ pulses.  Lymph: No lymphadenopathy of posterior or anterior cervical chain or axillae bilaterally.  Gait normal with good balance and coordination.  MSK:  Non tender with full range of motion  and good stability and symmetric strength and tone of shoulders, elbows, wrist, hip, knee and ankles bilaterally.     Impression and Recommendations:     This case required medical decision making of moderate complexity. The above documentation has been reviewed and is accurate and complete Lyndal Pulley, DO       Note: This dictation was prepared with Dragon dictation along with smaller phrase technology. Any transcriptional errors that result from this process are unintentional.

## 2019-02-19 ENCOUNTER — Ambulatory Visit: Payer: No Typology Code available for payment source | Admitting: Family Medicine

## 2020-10-01 ENCOUNTER — Other Ambulatory Visit (HOSPITAL_COMMUNITY): Payer: Self-pay

## 2020-10-01 MED ORDER — HYDROXYZINE HCL 25 MG PO TABS
ORAL_TABLET | ORAL | 0 refills | Status: DC
  Filled 2020-10-01: qty 30, 10d supply, fill #0

## 2021-03-16 NOTE — Progress Notes (Signed)
[name] [address] [place] Phone: [phone] Subjective:   [name], am serving as a scribe for Dr. Hulan Saas. This visit occurred during the SARS-CoV-2 public health emergency.  Safety protocols were in place, including screening questions prior to the visit, additional usage of staff PPE, and extensive cleaning of exam room while observing appropriate contact time as indicated for disinfecting solutions.   I'm seeing this patient by the request  of:  McGowen, Adrian Blackwater, MD  CC: Right-sided foot pain  [id number]  Last seen Dec 2020 for OMT   [name] is a 51 y.o. female coming in with complaint of right foot pain A week a go right foot started to hurt, location of pain base of the 5th. Dull pain constant pain, with pressure becomes sharp. Has used ice, heat, and ibuprofen.  Is able to walk on it but does have significant amount of discomfort.       Past Medical History:  Diagnosis Date   Arthritis    Endometriosis    Family history of breast cancer in mother    at age 110   GAD (generalized anxiety disorder)    lexapro helped 2017 (Dr. Hoy Finlay did have extra stress of the Master's degree program on her at that time.  Got counseling.       GERD (gastroesophageal reflux disease)    Gestational diabetes    Kidney stones    Low back pain    Dr. Charlann Boxer.   Neck pain    and left shoulder pain: managed by osteopathic manipulation by Dr. Charlann Boxer. ?cervical radiculopathy   PAC (premature atrial contraction)    Past Surgical History:  Procedure Laterality Date   ABDOMINAL EXPLORATION SURGERY  2009   SALPINGECTOMY Right 2009   TONSILLECTOMY     TUBAL LIGATION Left 2009   Social History   Socioeconomic History   Marital status: Married    Spouse name: Not on file   Number of children: 3   Years of education: Not on file   Highest education level: Not on file  Occupational History    Occupation: nurse    Employer: [employer]    Comment: at [employer]   Tobacco Use   Smoking status: Never   Smokeless tobacco: Never  Vaping Use   Vaping Use: Never used  Substance and Sexual Activity   Alcohol use: Yes    Comment: Occasionally   Drug use: No   Sexual activity: Yes    Partners: Male    Birth control/protection: Surgical  Other Topics Concern   Not on file  Social History Narrative   Married, 3 kids.    Lives in [place].   Educ: Masters deg nursing.   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   No tob/drugs.   Alc: social.   Social Determinants of Health   Financial Resource Strain: Not on file  Food Insecurity: Not on file  Transportation Needs: Not on file  Physical Activity: Not on file  Stress: Not on file  Social Connections: Not on file   Allergies  Allergen Reactions   Latex Other (See Comments)    Sensitivity only   Sulfa Antibiotics Hives   Sulfamethoxazole-Trimethoprim Rash   Septra [Bactrim] Rash   Family History  Problem Relation Age of Onset   Coronary artery disease Other  Rheum arthritis Mother    Stroke Mother    Hypertension Mother    Heart disease Mother    Cancer Mother    Arthritis Mother    Breast cancer Mother    Heart attack Mother 9   Glaucoma Other    Diabetes Father    Heart disease Father    Hyperlipidemia Father    Hypertension Father    Heart attack Father 51   Heart disease Maternal Grandmother    Heart attack Maternal Grandmother 88   Arthritis Maternal Grandmother    Diabetes Paternal Grandmother    Hypertension Paternal Grandmother    Heart disease Paternal Grandmother    Heart attack Paternal Grandmother 75   Heart disease Paternal Grandfather    Heart attack Paternal Grandfather 78   Early death Maternal Grandfather        MVA      Current Outpatient Medications (Respiratory):    loratadine (CLARITIN) 10 MG  tablet, Take 10 mg by mouth daily as needed for allergies.    Current Outpatient Medications (Other):    Vitamin D, Ergocalciferol, (DRISDOL) 1.25 MG (50000 UNIT) CAPS capsule, Take 1 capsule (50,000 Units total) by mouth every 7 (seven) days.   ELDERBERRY PO, Take 1 each by mouth daily.   famotidine (PEPCID) 10 MG tablet, Take 10 mg by mouth daily as needed for heartburn or indigestion.   gabapentin (NEURONTIN) 100 MG capsule, Take 2 capsules (200 mg total) by mouth at bedtime.   hydrOXYzine (ATARAX/VISTARIL) 25 MG tablet, Take one tablet (25 mg dose) by mouth 3 (three) times a day as needed for Itching for up to 10 days.   Multiple Vitamins-Minerals (MULTIVITAMINS THER. W/MINERALS) TABS, Take 1 tablet by mouth daily.   tiZANidine (ZANAFLEX) 4 MG tablet, Take 1 tablet (4 mg total) by mouth at bedtime.   Reviewed prior external information including notes and imaging from  primary care provider As well as notes that were available from care everywhere and other healthcare systems.  Past medical history, social, surgical and family history all reviewed in electronic medical record.  No pertanent information unless stated regarding to the chief complaint.   Review of Systems:  No headache, visual changes, nausea, vomiting, diarrhea, constipation, dizziness, abdominal pain, skin rash, fevers, chills, night sweats, weight loss, swollen lymph nodes, body aches, joint swelling, chest pain, shortness of breath, mood changes. POSITIVE muscle aches  Objective  Blood pressure 124/72, pulse 90, height 4\' 11"  (1.499 m), weight 110 lb (49.9 kg), last menstrual period 02/18/2021, SpO2 99 %.   General: No apparent distress alert and oriented x3 mood and affect normal, dressed appropriately.  HEENT: Pupils equal, extraocular movements intact  Respiratory: Patient's speak in full sentences and does not appear short of breath  Cardiovascular: No lower extremity edema, non tender, no erythema  Gait  antalgic favoring the right foot MSK: Right foot exam shows the patient does have tenderness to palpation over the fifth metatarsal proximally.  There is some mild swelling and erythema noted.  Neurovascularly intact distally.  Limited muscular skeletal ultrasound was performed and interpreted by Hulan Saas, M  Limited exam shows the patient does have a cortical irregularity noted of the proximal fifth metatarsal.  Seems to be no extra-articular but does seem to go towards the Lisfranc joint.  Does not seem to go into the joint though.  No significant displacement noted.  Some overlying hypoechoic changes noted. Impression: Likely small nondisplaced fracture of the fifth metatarsal proximally.   Impression  and Recommendations:     The above documentation has been reviewed and is accurate and complete Lyndal Pulley, DO

## 2021-03-17 ENCOUNTER — Ambulatory Visit (INDEPENDENT_AMBULATORY_CARE_PROVIDER_SITE_OTHER): Payer: BC Managed Care – PPO | Admitting: Family Medicine

## 2021-03-17 ENCOUNTER — Other Ambulatory Visit (HOSPITAL_COMMUNITY): Payer: Self-pay

## 2021-03-17 ENCOUNTER — Ambulatory Visit: Payer: Self-pay

## 2021-03-17 ENCOUNTER — Other Ambulatory Visit: Payer: Self-pay

## 2021-03-17 ENCOUNTER — Ambulatory Visit (INDEPENDENT_AMBULATORY_CARE_PROVIDER_SITE_OTHER): Payer: BC Managed Care – PPO

## 2021-03-17 ENCOUNTER — Encounter: Payer: Self-pay | Admitting: Family Medicine

## 2021-03-17 VITALS — BP 124/72 | HR 90 | Ht 59.0 in | Wt 110.0 lb

## 2021-03-17 DIAGNOSIS — M79671 Pain in right foot: Secondary | ICD-10-CM

## 2021-03-17 DIAGNOSIS — S92354A Nondisplaced fracture of fifth metatarsal bone, right foot, initial encounter for closed fracture: Secondary | ICD-10-CM | POA: Diagnosis not present

## 2021-03-17 MED ORDER — VITAMIN D (ERGOCALCIFEROL) 1.25 MG (50000 UNIT) PO CAPS
50000.0000 [IU] | ORAL_CAPSULE | ORAL | 0 refills | Status: DC
  Filled 2021-03-17: qty 12, 84d supply, fill #0

## 2021-03-17 NOTE — Assessment & Plan Note (Signed)
Patient is having improvement.  Fracture.  Seems to be nondisplaced.  Questionable fourth metatarsal as well.  Discussed with patient about icing regimen, home exercises, which activities as well.  Follow-up in 6 to 8 weeks

## 2021-03-17 NOTE — Patient Instructions (Addendum)
Xrays today Vit D and [place] See you again in 3-4 weeks

## 2021-03-24 ENCOUNTER — Encounter: Payer: Self-pay | Admitting: Family Medicine

## 2021-04-12 NOTE — Progress Notes (Signed)
Zach Dasia Guerrier Diamond [address] [place] Phone: [phone] Subjective:   [name], am serving as a scribe for Dr. Hulan Saas. This visit occurred during the SARS-CoV-2 public health emergency.  Safety protocols were in place, including screening questions prior to the visit, additional usage of staff PPE, and extensive cleaning of exam room while observing appropriate contact time as indicated for disinfecting solutions.   I'm seeing this patient by the request  of:  McGowen, Adrian Blackwater, MD  CC: foot fracture follow up   RWE:[id number]  03/17/2021 Patient is having improvement.  Fracture.  Seems to be nondisplaced.  Questionable fourth metatarsal as well.  Discussed with patient about icing regimen, home exercises, which activities as well.  Follow-up in 6 to 8 weeks  Updated 04/13/2021 [name] is a 51 y.o. female coming in with complaint of right foot pain. Feeling better, hurts a little, but not terrible. Wearing boots and takes a ibuprofen if necessary. No new complaints.  Xray (-)     Past Medical History:  Diagnosis Date   Arthritis    Endometriosis    Family history of breast cancer in mother    at age 62   GAD (generalized anxiety disorder)    lexapro helped 2017 (Dr. Hoy Finlay did have extra stress of the Master's degree program on her at that time.  Got counseling.       GERD (gastroesophageal reflux disease)    Gestational diabetes    Kidney stones    Low back pain    Dr. Charlann Boxer.   Neck pain    and left shoulder pain: managed by osteopathic manipulation by Dr. Charlann Boxer. ?cervical radiculopathy   PAC (premature atrial contraction)    Past Surgical History:  Procedure Laterality Date   ABDOMINAL EXPLORATION SURGERY  2009   SALPINGECTOMY Right 2009   TONSILLECTOMY     TUBAL LIGATION Left 2009   Social History   Socioeconomic History   Marital status: Married    Spouse name: Not on file   Number of  children: 3   Years of education: Not on file   Highest education level: Not on file  Occupational History   Occupation: nurse    Employer: [employer]    Comment: at [employer] [facility]   Tobacco Use   Smoking status: Never   Smokeless tobacco: Never  Vaping Use   Vaping Use: Never used  Substance and Sexual Activity   Alcohol use: Yes    Comment: Occasionally   Drug use: No   Sexual activity: Yes    Partners: Male    Birth control/protection: Surgical  Other Topics Concern   Not on file  Social History Narrative   Married, 3 kids.    Lives in [place].   Educ: Masters deg nursing.   Pt works as a Occupational hygienist at [employer] with husband Randall Hiss and three kids (Will, [place]).   No tob/drugs.   Alc: social.   Social Determinants of Health   Financial Resource Strain: Not on file  Food Insecurity: Not on file  Transportation Needs: Not on file  Physical Activity: Not on file  Stress: Not on file  Social Connections: Not on file   Allergies  Allergen Reactions   Latex Other (See Comments)    Sensitivity only   Sulfa Antibiotics Hives   Sulfamethoxazole-Trimethoprim Rash   Septra [Bactrim] Rash   Family History  Problem  Relation Age of Onset   Coronary artery disease Other    Rheum arthritis Mother    Stroke Mother    Hypertension Mother    Heart disease Mother    Cancer Mother    Arthritis Mother    Breast cancer Mother    Heart attack Mother 22   Glaucoma Other    Diabetes Father    Heart disease Father    Hyperlipidemia Father    Hypertension Father    Heart attack Father 94   Heart disease Maternal Grandmother    Heart attack Maternal Grandmother 88   Arthritis Maternal Grandmother    Diabetes Paternal Grandmother    Hypertension Paternal Grandmother    Heart disease Paternal Grandmother    Heart attack Paternal Grandmother 103   Heart disease Paternal Grandfather    Heart attack Paternal Grandfather 39   Early death Maternal  Grandfather        MVA      Current Outpatient Medications (Respiratory):    loratadine (CLARITIN) 10 MG tablet, Take 10 mg by mouth daily as needed for allergies.    Current Outpatient Medications (Other):    ELDERBERRY PO, Take 1 each by mouth daily.   famotidine (PEPCID) 10 MG tablet, Take 10 mg by mouth daily as needed for heartburn or indigestion.   gabapentin (NEURONTIN) 100 MG capsule, Take 2 capsules (200 mg total) by mouth at bedtime.   hydrOXYzine (ATARAX/VISTARIL) 25 MG tablet, Take one tablet (25 mg dose) by mouth 3 (three) times a day as needed for Itching for up to 10 days.   Multiple Vitamins-Minerals (MULTIVITAMINS THER. W/MINERALS) TABS, Take 1 tablet by mouth daily.   tiZANidine (ZANAFLEX) 4 MG tablet, Take 1 tablet (4 mg total) by mouth at bedtime.   Vitamin D, Ergocalciferol, (DRISDOL) 1.25 MG (50000 UNIT) CAPS capsule, Take 1 capsule (50,000 Units total) by mouth every 7 (seven) days.   Objective  Blood pressure 116/70, pulse 84, height 4\' 11"  (1.499 m), SpO2 99 %.   General: No apparent distress alert and oriented x3 mood and affect normal, dressed appropriately.  HEENT: Pupils equal, extraocular movements intact  Respiratory: Patient's speak in full sentences and does not appear short of breath  Cardiovascular: No lower extremity edema, non tender, no erythema  Gait normal with good balance and coordination.  MSK: Patient's right foot has very mild tenderness over the fifth metatarsal proximally.  Patient has good range of motion though otherwise of the ankle.  Neurovascular intact with no significant swelling.  Limited muscular skeletal ultrasound was performed and interpreted by Hulan Saas, M  Limited ultrasound shows the patient does have a good hard callus noted at this point.  Patient has significant reabsorption of the bone at the moment. Impression: Interval improvement with near full resolution    Impression and Recommendations:     The above  documentation has been reviewed and is accurate and complete Lyndal Pulley, DO

## 2021-04-13 ENCOUNTER — Other Ambulatory Visit: Payer: Self-pay

## 2021-04-13 ENCOUNTER — Ambulatory Visit (INDEPENDENT_AMBULATORY_CARE_PROVIDER_SITE_OTHER): Payer: BC Managed Care – PPO | Admitting: Family Medicine

## 2021-04-13 ENCOUNTER — Ambulatory Visit: Payer: Self-pay

## 2021-04-13 VITALS — BP 116/70 | HR 84 | Ht 59.0 in

## 2021-04-13 DIAGNOSIS — S92354A Nondisplaced fracture of fifth metatarsal bone, right foot, initial encounter for closed fracture: Secondary | ICD-10-CM

## 2021-04-13 NOTE — Assessment & Plan Note (Signed)
Patient surprisingly has healed significantly faster than anticipated.  We discussed still different shoes.  Patient wants to start walking again and do feel that appropriate shoes will be more beneficial and given discussion what to potentially do.  Follow-up with me again as needed

## 2021-04-13 NOTE — Patient Instructions (Signed)
Good to see you! Thanks for making my days [employer] or [place] for walking  See you when you need me

## 2021-07-28 ENCOUNTER — Encounter: Payer: Self-pay | Admitting: Family Medicine

## 2021-12-01 DIAGNOSIS — C801 Malignant (primary) neoplasm, unspecified: Secondary | ICD-10-CM

## 2021-12-01 HISTORY — DX: Malignant (primary) neoplasm, unspecified: C80.1

## 2021-12-02 ENCOUNTER — Other Ambulatory Visit: Payer: Self-pay | Admitting: Obstetrics and Gynecology

## 2021-12-02 ENCOUNTER — Telehealth: Payer: Self-pay

## 2021-12-02 ENCOUNTER — Encounter: Payer: Self-pay | Admitting: Gynecologic Oncology

## 2021-12-02 ENCOUNTER — Ambulatory Visit
Admission: RE | Admit: 2021-12-02 | Discharge: 2021-12-02 | Disposition: A | Discharge status: Home / Self Care | Payer: 59 | Source: Ambulatory Visit | Attending: Obstetrics and Gynecology | Admitting: Obstetrics and Gynecology

## 2021-12-02 DIAGNOSIS — R1900 Intra-abdominal and pelvic swelling, mass and lump, unspecified site: Secondary | ICD-10-CM

## 2021-12-02 MED ORDER — GADOPICLENOL 0.5 MMOL/ML IV SOLN
5.0000 mL | Freq: Once | INTRAVENOUS | Status: AC | PRN
  Administered 2021-12-02: 5 mL via INTRAVENOUS

## 2021-12-02 NOTE — Telephone Encounter (Signed)
Spoke with the patient regarding the referral to GYN oncology. Patient scheduled as new patient with Dr Berline Lopes on 12/07/2021. Patient given an arrival time of 8:00am.  Explained to the patient the the doctor will perform a pelvic exam at this visit. Patient given the policy that no visitors under the 16 yrs are allowed in the [facility]. Patient given the address/phone number for the clinic and that the center offers free valet service.

## 2021-12-04 ENCOUNTER — Other Ambulatory Visit: Payer: BC Managed Care – PPO

## 2021-12-07 ENCOUNTER — Encounter: Payer: Self-pay | Admitting: General Practice

## 2021-12-07 ENCOUNTER — Telehealth: Payer: Self-pay | Admitting: Gynecologic Oncology

## 2021-12-07 ENCOUNTER — Encounter: Payer: Self-pay | Admitting: Gynecologic Oncology

## 2021-12-07 ENCOUNTER — Inpatient Hospital Stay: Payer: 59

## 2021-12-07 ENCOUNTER — Telehealth: Payer: Self-pay

## 2021-12-07 ENCOUNTER — Inpatient Hospital Stay: Payer: 59 | Attending: Gynecologic Oncology | Admitting: Gynecologic Oncology

## 2021-12-07 ENCOUNTER — Encounter (HOSPITAL_COMMUNITY): Payer: Self-pay

## 2021-12-07 ENCOUNTER — Ambulatory Visit (HOSPITAL_COMMUNITY)
Admission: RE | Admit: 2021-12-07 | Discharge: 2021-12-07 | Disposition: A | Discharge status: Home / Self Care | Payer: 59 | Source: Ambulatory Visit | Attending: Gynecologic Oncology | Admitting: Gynecologic Oncology

## 2021-12-07 ENCOUNTER — Ambulatory Visit (HOSPITAL_BASED_OUTPATIENT_CLINIC_OR_DEPARTMENT_OTHER)
Admission: RE | Admit: 2021-12-07 | Discharge: 2021-12-07 | Disposition: A | Discharge status: Home / Self Care | Payer: 59 | Source: Ambulatory Visit | Attending: Gynecologic Oncology | Admitting: Gynecologic Oncology

## 2021-12-07 ENCOUNTER — Other Ambulatory Visit: Payer: Self-pay

## 2021-12-07 VITALS — BP 116/75 | HR 125 | Temp 98.5°F | Resp 18 | Ht 60.0 in | Wt 114.2 lb

## 2021-12-07 DIAGNOSIS — C579 Malignant neoplasm of female genital organ, unspecified: Secondary | ICD-10-CM

## 2021-12-07 DIAGNOSIS — R000 Tachycardia, unspecified: Secondary | ICD-10-CM | POA: Diagnosis not present

## 2021-12-07 DIAGNOSIS — R350 Frequency of micturition: Secondary | ICD-10-CM | POA: Insufficient documentation

## 2021-12-07 DIAGNOSIS — D473 Essential (hemorrhagic) thrombocythemia: Secondary | ICD-10-CM

## 2021-12-07 DIAGNOSIS — R1900 Intra-abdominal and pelvic swelling, mass and lump, unspecified site: Secondary | ICD-10-CM | POA: Insufficient documentation

## 2021-12-07 DIAGNOSIS — N9489 Other specified conditions associated with female genital organs and menstrual cycle: Secondary | ICD-10-CM

## 2021-12-07 DIAGNOSIS — R180 Malignant ascites: Secondary | ICD-10-CM | POA: Insufficient documentation

## 2021-12-07 DIAGNOSIS — F411 Generalized anxiety disorder: Secondary | ICD-10-CM | POA: Diagnosis not present

## 2021-12-07 DIAGNOSIS — C800 Disseminated malignant neoplasm, unspecified: Secondary | ICD-10-CM

## 2021-12-07 DIAGNOSIS — C786 Secondary malignant neoplasm of retroperitoneum and peritoneum: Secondary | ICD-10-CM | POA: Diagnosis not present

## 2021-12-07 DIAGNOSIS — I491 Atrial premature depolarization: Secondary | ICD-10-CM | POA: Insufficient documentation

## 2021-12-07 DIAGNOSIS — R110 Nausea: Secondary | ICD-10-CM | POA: Diagnosis not present

## 2021-12-07 DIAGNOSIS — K219 Gastro-esophageal reflux disease without esophagitis: Secondary | ICD-10-CM | POA: Diagnosis not present

## 2021-12-07 DIAGNOSIS — F419 Anxiety disorder, unspecified: Secondary | ICD-10-CM | POA: Diagnosis not present

## 2021-12-07 DIAGNOSIS — C801 Malignant (primary) neoplasm, unspecified: Secondary | ICD-10-CM | POA: Insufficient documentation

## 2021-12-07 DIAGNOSIS — M1990 Unspecified osteoarthritis, unspecified site: Secondary | ICD-10-CM | POA: Diagnosis not present

## 2021-12-07 LAB — CMP (CANCER CENTER ONLY)
ALT: 10 U/L (ref 0–44)
AST: 11 U/L — ABNORMAL LOW (ref 15–41)
Albumin: 3.5 g/dL (ref 3.5–5.0)
Alkaline Phosphatase: 50 U/L (ref 38–126)
Anion gap: 8 (ref 5–15)
BUN: 9 mg/dL (ref 6–20)
CO2: 28 mmol/L (ref 22–32)
Calcium: 8.8 mg/dL — ABNORMAL LOW (ref 8.9–10.3)
Chloride: 101 mmol/L (ref 98–111)
Creatinine: 0.67 mg/dL (ref 0.44–1.00)
GFR, Estimated: >60 mL/min (ref >60)
Glucose, Bld: 126 mg/dL — ABNORMAL HIGH (ref 70–99)
Potassium: 4.3 mmol/L (ref 3.5–5.1)
Sodium: 137 mmol/L (ref 135–145)
Total Bilirubin: 0.5 mg/dL (ref 0.3–1.2)
Total Protein: 6.6 g/dL (ref 6.5–8.1)

## 2021-12-07 LAB — CBC (CANCER CENTER ONLY)
HCT: 37.9 % (ref 36.0–46.0)
Hemoglobin: 12.4 g/dL (ref 12.0–15.0)
MCH: 30.5 pg (ref 26.0–34.0)
MCHC: 32.7 g/dL (ref 30.0–36.0)
MCV: 93.1 fL (ref 80.0–100.0)
Platelet Count: 610 10*3/uL — ABNORMAL HIGH (ref 150–400)
RBC: 4.07 MIL/uL (ref 3.87–5.11)
RDW: 12.6 % (ref 11.5–15.5)
WBC Count: 19.3 10*3/uL — ABNORMAL HIGH (ref 4.0–10.5)
nRBC: 0 % (ref 0.0–0.2)

## 2021-12-07 MED ORDER — SODIUM CHLORIDE (PF) 0.9 % IJ SOLN
INTRAMUSCULAR | Status: AC
  Filled 2021-12-07: qty 50

## 2021-12-07 MED ORDER — APIXABAN 2.5 MG PO TABS
2.5000 mg | ORAL_TABLET | Freq: Two times a day (BID) | ORAL | 6 refills | Status: DC
  Filled 2021-12-07: qty 60, 30d supply, fill #0

## 2021-12-07 MED ORDER — IOHEXOL 350 MG/ML SOLN
80.0000 mL | Freq: Once | INTRAVENOUS | Status: AC | PRN
  Administered 2021-12-07: 80 mL via INTRAVENOUS

## 2021-12-07 MED ORDER — IOHEXOL 300 MG/ML  SOLN: 100.0000 mL | Freq: Once | INTRAMUSCULAR | Status: DC | PRN

## 2021-12-07 NOTE — Progress Notes (Unsigned)
Cardiology Office Note:    Date:  12/09/2021   ID:  [name], DOB 1970-05-31, MRN [id number]  PCP:  Tammi Sou, MD   [facility] Cardiologist:  Lenna Sciara, MD Referring MD: Tammi Sou, MD   Chief Complaint/Reason for Referral: Tachycardia  ASSESSMENT:    1. Tachycardia   2. Aortic atherosclerosis (HCC)     PLAN:    In order of problems listed above:  1.  Tachycardia: This is likely due to abdominal pain.  Her heart rate now is in the low 100s.  I think this is understandable given what is going on.  I will check a reflex TSH.  I will check an echocardiogram to ensure that her heart function is reasonable for chemotherapy. 2.  Aortic atherosclerosis: The patient is on Eliquis per her oncologist given the possibility of impingement of the vena cava by her abdominal mass which puts her at higher risk for DVT.  We will defer statin therapy for now given her acute issues.             Dispo:  Return in about 6 months (around 06/10/2022).      Medication Adjustments/Labs and Tests Ordered: Current medicines are reviewed at length with the patient today.  Concerns regarding medicines are outlined above.  The following changes have been made:  no change   Labs/tests ordered: Orders Placed This Encounter  Procedures   TSH Rfx on Abnormal to Free T4   EKG 12-Lead   ECHOCARDIOGRAM COMPLETE    Medication Changes: No orders of the defined types were placed in this encounter.    Current medicines are reviewed at length with the patient today.  The patient does not have concerns regarding medicines.   History of Present Illness:    FOCUSED PROBLEM LIST:   1.  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  GERD 3.  Aortic atherosclerosis on CT abdomen pelvis 2023   The patient is a 51 y.o. female with the indicated medical history here for recommendations regarding tachycardia.  The patient was seen her OB/GYN  oncologic provider yesterday due to large abdominopelvic mass.  She underwent evaluation for PE including a CT scan extremity Dopplers which were both negative.  No EKG was performed.    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  She denies any chest pain, shortness of breath, presyncope, syncope, palpitations, paroxysmal atrial dyspnea, orthopnea.  She is understandably quite anxious about her health status.  This was all first noticed when she developed some abnormal uterine bleeding and noticed mass in her left abdomen.  Yesterday she had a biopsy and an indwelling infusion catheter placed.  She does not smoke.          Current Medications: Current Meds  Medication Sig   ALPRAZolam (XANAX) 0.25 MG tablet Take 0.25 mg by mouth at bedtime as needed for anxiety.   apixaban (ELIQUIS) 2.5 MG TABS tablet Take 1 tablet (2.5 mg total) by mouth 2 (two) times daily. Wait to start this until after you have had your procedures   famotidine (PEPCID) 10 MG tablet Take 10 mg by mouth daily as needed for heartburn or indigestion.   hydrOXYzine (ATARAX/VISTARIL) 25 MG tablet Take one tablet (25 mg dose) by mouth 3 (three) times a day as needed for Itching for up to 10 days. (Patient taking differently: Take 25 mg by mouth every 8 (eight)  hours as needed for itching.)   loratadine (CLARITIN) 10 MG tablet Take 10 mg by mouth daily as needed for allergies.   Simethicone (GAS-X PO) Take 1 tablet by mouth as needed (indigestion gas).   tiZANidine (ZANAFLEX) 4 MG tablet Take 1 tablet (4 mg total) by mouth at bedtime. (Patient taking differently: Take 4 mg by mouth as needed for muscle spasms.)   [DISCONTINUED] Vitamin D, Ergocalciferol, (DRISDOL) 1.25 MG (50000 UNIT) CAPS capsule Take 1 capsule (50,000 Units total) by mouth every 7 (seven) days.     Allergies:    Latex, Sulfa antibiotics, Sulfamethoxazole-trimethoprim, and Septra [bactrim]   Social  History:   Social History   Tobacco Use   Smoking status: Never   Smokeless tobacco: Never  Vaping Use   Vaping Use: Never used  Substance Use Topics   Alcohol use: Yes    Comment: Occasionally   Drug use: No     Family Hx: Family History  Problem Relation Age of Onset   Rheum arthritis Mother    Stroke Mother    Hypertension Mother    Heart disease Mother    Arthritis Mother    Breast cancer Mother    Heart attack Mother 58   Diabetes Father    Heart disease Father    Hyperlipidemia Father    Hypertension Father    Heart attack Father 20   Heart disease Maternal Grandmother    Heart attack Maternal Grandmother 88   Arthritis Maternal Grandmother    Early death Maternal Grandfather        MVA   Diabetes Paternal Grandmother    Hypertension Paternal Grandmother    Heart disease Paternal Grandmother    Heart attack Paternal Grandmother 67   Heart disease Paternal Grandfather    Heart attack Paternal Grandfather 92   Coronary artery disease Other    Glaucoma Other    Non-Hodgkin's lymphoma Paternal Uncle      Review of Systems:   Please see the history of present illness.    All other systems reviewed and are negative.     EKGs/Labs/Other Test Reviewed:    EKG:  EKG performed 2017 that I personally reviewed demonstrates sinus rhythm with occasional PVCs; EKG performed today that I personally reviewed demonstrates sinus tachycardia with nonspecific T wave abnormality.  Prior CV studies: None available    Other studies Reviewed: Review of the additional studies/records demonstrates: Aortic atherosclerosis seen on CT abdomen pelvis 2023  Recent Labs: 12/07/2021: ALT 10; BUN 9; Creatinine 0.67; Potassium 4.3; Sodium 137 12/08/2021: Hemoglobin 11.9; Platelets 584   Recent Lipid Panel Lab Results  Component Value Date/Time   LDLDIRECT 105 (H) 03/23/2011 03:50 PM    Risk Assessment/Calculations:                Physical Exam:    VS:  BP 110/72 (BP  Location: Left Arm, Patient Position: Sitting)   Pulse (!) 105   Ht '4\' 11"'$  (1.499 m)   Wt 117 lb 6.4 oz (53.3 kg)   LMP  (LMP Unknown) Comment: tubal ligation  SpO2 97%   BMI 23.71 kg/m    Wt Readings from Last 3 Encounters:  12/09/21 117 lb 6.4 oz (53.3 kg)  12/08/21 114 lb (51.7 kg)  12/07/21 114 lb 4 oz (51.8 kg)    GENERAL:  No apparent distress, AOx3 HEENT:  No carotid bruits, +2 carotid impulses, no scleral icterus CAR: RRR no murmurs, gallops, rubs, or thrills RES:  Clear to auscultation bilaterally  ABD:  Soft, abdominal masses palpated in the left lower quadrant, nondistended, positive bowel sounds x 4 VASC:  +2 radial pulses, +2 carotid pulses, palpable pedal pulses NEURO:  CN 2-12 grossly intact; motor and sensory grossly intact PSYCH:  No active depression or anxiety EXT:  No edema, ecchymosis, or cyanosis  Signed, Early Osmond, MD  12/09/2021 8:41 AM    [facility] [place], [place], [place]  [address] Phone: [phone]; Fax: [phone]   Note:  This document was prepared using Dragon voice recognition software and may include unintentional dictation errors.

## 2021-12-07 NOTE — Progress Notes (Unsigned)
GYNECOLOGIC ONCOLOGY NEW PATIENT CONSULTATION   Patient Name: [name]  Patient Age: 51 y.o. Date of Service: 12/07/21 Referring Provider: Dr. Eula Flax  Primary Care Provider: Tammi Sou, MD Consulting Provider: Jeral Pinch, MD   Assessment/Plan:  Pre versus perimenopausal patient with recent onset of abdominal symptoms and findings c/w widely metastatic gynecologic malignancy.   I reviewed with the patient and her family who accompanied her today findings on recent MRI.  Patient's preference was not to review these images.  We discussed large abdominal pelvic mass, likely arising from 1 or both adnexa.  We also discussed ascites and peritoneal implants.  These findings are concerning for metastatic gynecologic cancer, at least stage IIIC.   Given right upper quadrant tumor implant seen on sagittal images of the pelvic MRI, I recommended getting an abdominal CT scan to better characterize upper abdominal disease as we formulate our treatment plan.  Additionally, given tachycardia, hypercoagulability in the setting of malignancy, and compression of great vessels in the mid abdomen secondary to mass effect, I also recommended that we get CT angio of the chest to rule out a pulmonary embolism and lower extremity Dopplers to rule out DVT.  Patient has previously had palpitations and saw cardiology during prior pregnancy.  If work-up is negative for evidence of VTE, discussed urgent referral to cardiology if we ultimately are planning to move forward with primary debulking surgery.  Tumor markers were drawn by the patient's OB/GYN.  Her hCG is elevated.  CEA and Ca1 25 are not back.  My office reached out again to have these faxed over once they are available.  In the setting of what I believe is at least stage III ovarian cancer, I discussed that the treatment approach for this disease is typically combination of cytoreductive surgery and chemotherapy. I discussed that  sequencing of this can be either with upfront debulking followed by adjuvant chemotherapy sequentially or neoadjuvant chemotherapy followed by an interval cytoreductive attempt, then additional chemotherapy. This latter approach is associated with a reduced perioperative morbidity at the time of surgery.  I discussed that decisions regarding sequencing of therapy is individualized taking into account individual patient health, in addition to the apparent tumor distribution on imaging, and likelihood of complete surgical resection at the time of surgery. I discussed that the overall survival observed in patients is equivalent for both approaches (neoadjuvant chemotherapy versus primary debulking surgery) provided that there is an optimal cytoreductive effort at the time of surgery (regardless of the timing of that surgery).  Although the patient is overall quite healthy excellent candidate for debulking surgery, I am concerned about her right upper abdominal tumor implant and possible liver involvement as well as the possibility of VTE.  I discussed that we will wait for abdominal and chest imaging as well as lower extremity Dopplers before making a final decision about beginning treatment either with neoadjuvant chemotherapy or primary debulking surgery.  Ideally, it would be great to debulk the large pelvic mass to help with her abdominal symptoms.  If she is not found to have VTE and we are proceeding with neoadjuvant chemotherapy, I think that she is at quite high risk for development of VTE.  Her Bishop Limbo score is 3, places her at high risk for chemotherapy associated VTE.  Discussed starting prophylactic anticoagulation in this setting.  We discussed that a percentage of GYN malignancies are related to hereditary mutations.  We discussed the implications if she was found to have a germline mutation  in terms of other cancers that she would be at risk for and the potential impact on family members.  We also  discussed the use of somatic testing to identify mutations in her tumor which may have implications for future therapy.  I will call the patient later today with the results of her CT chest and abdomen, lower extremity Dopplers, and lab work.  A copy of this note was sent to the patient's referring provider.   80 minutes of total time was spent for this patient encounter, including preparation, face-to-face counseling with the patient and coordination of care, and documentation of the encounter.   Jeral Pinch, MD  Division of Gynecologic Oncology  Department of Obstetrics and Gynecology  [facility]  ___________________________________________  Chief Complaint: Chief Complaint  Patient presents with   Adnexal mass    History of Present Illness:  [name] is a 51 y.o. y.o. female who is seen in consultation at the request of Dr. Wilhelmenia Blase for an evaluation of large abdominopelvic mass and imaging findings concerning for metastatic gyn malignancy.  The patient was seen on 10/18 for a new patient visit in the setting of new onset abnormal uterine bleeding as well as abdominal pelvic fullness.  Her last menstrual cycle was in mid August.  Denies any abdominal pelvic pain but noted increasing urinary frequency during August and September.  She then began having heavy uterine bleeding, was started on 200 mg of oral Prometrium but stopped this after approximately 11 days secondary to side effects.  Endorses only occasional spotting since then.  Office ultrasound at the time of this visit showed a uterus measuring 8.6 x 4.4 x 4 cm, deviated to the left with increased vascularity within the myometrium.  Right ovary noted to have a 16 cm heterogenous, cystic and solid-appearing vascular mass.  Left ovary was not visualized.  Questionable ascites in the right lower quadrant.  Endometrial biopsy was attempted but aborted given patient discomfort with the  procedure.  Pelvic MRI on 10/19 shows a multilobulated mass measuring 21 x 11 x 19 cm abutting both adnexa.  It satellite mass in the right upper quadrant anteriorly measures 6.9 x 4.1 x 6.4 cm.  Moderate ascites with tumor deposits along the pelvic ascites posteriorly.  Mass extends above the uterus, could be arising from either or both ovaries.  There is abnormal enhancement along ascites margins in the paracolic gutter, most compatible with malignant ascites.  At least a 7 cm tumor deposit noted just above the liver.  Hcg: 116 Estradiol: 66.7 Inhibin B: <7 HE4: 90.6 CEA: pending CA-125: pending AFP: pending  Today, the patient presents with her husband and father.  She describes having regular menstrual cycles lasting 23-25 days.  In July, she began having some urinary frequency.  On August 11, she moved her son into his dorm room in college.  She had a normal cycle at this time but had much more cramping than she typically does.  She had urinary frequency again during this time that improved after her menses.  Her September menstrual cycle did not come as it should have but she began bleeding about 2 weeks later.  This bleeding was much heavier with passage of clots yet.  She then noticed increasing abdominal distention.  At this point, she called to get in with one of her medical providers.  She saw her primary care provider at the beginning of October.  She was given progesterone which she took for  10 days to help with her bleeding.  Her bleeding stopped and she has had only very light and occasional spotting since.  She noted decreased appetite with the progesterone which has continued after stopping the progesterone.  She now thinks that her decreased appetite may be at least in part due to her anxiety.  She was started on Xanax which she is using in addition to as needed Vistaril for anxiety.  She endorses some intermittent mild nausea, denies any emesis.  She has noted change in her bowel  function over the last week.  She continues to have daily bowel movements but is not sure that she is emptying all the way.  She endorses some palpitations and feeling her heart beat fast.  She thinks this may also be due to anxiety.  She previously had had palpitations during one of her pregnancies, saw cardiology at the time.  She denies any shortness of breath or leg edema.  Her history is notable for an ectopic in 2017 treated with methotrexate.  She underwent tubal ligation (one tube completely removed) for contraception.  At the time of the surgery in 2009, she underwent diagnostic laparoscopy, left tubal interruption with cautery, right salpingectomy, and ablation of right ovarian fossa endometriosis.  PAST MEDICAL HISTORY:  Past Medical History:  Diagnosis Date   Arthritis    Endometriosis    Family history of breast cancer in mother    at age 40   GAD (generalized anxiety disorder)    lexapro helped 2017 (Dr. Hoy Finlay did have extra stress of the Master's degree program on her at that time.  Got counseling.       GERD (gastroesophageal reflux disease)    Gestational diabetes    Kidney stones    Low back pain    Dr. Charlann Boxer.   Neck pain    and left shoulder pain: managed by osteopathic manipulation by Dr. Charlann Boxer. ?cervical radiculopathy   PAC (premature atrial contraction)      PAST SURGICAL HISTORY:  Past Surgical History:  Procedure Laterality Date   ABDOMINAL EXPLORATION SURGERY  2009   fulguration of endometriosis - [facility] surgery   SALPINGECTOMY Right 2009   TONSILLECTOMY     TUBAL LIGATION Left 2009    OB/GYN HISTORY:  OB History  Gravida Para Term Preterm AB Living  '6 3     3 3  '$ SAB IAB Ectopic Multiple Live Births  '2   1   3    '$ # Outcome Date GA Lbr Len/2nd Weight Sex Delivery Anes PTL Lv  6 Ectopic           5 SAB           4 SAB           3 Para           2 Para           1 Para             No LMP recorded.  Age at menarche: 89  Age at  menopause: not applicable Hx of HRT: not applicable Hx of STDs: no Last pap: 09/2018 - [facility] History of abnormal pap smears: denies  SCREENING STUDIES:  Last mammogram: 2022  Last colonoscopy: has never had  MEDICATIONS: Outpatient Encounter Medications as of 12/07/2021  Medication Sig   apixaban (ELIQUIS) 2.5 MG TABS tablet Take 1 tablet (2.5 mg total) by mouth 2 (two) times daily. Wait to start this until after you  have had your procedures   famotidine (PEPCID) 10 MG tablet Take 10 mg by mouth daily as needed for heartburn or indigestion.   hydrOXYzine (ATARAX/VISTARIL) 25 MG tablet Take one tablet (25 mg dose) by mouth 3 (three) times a day as needed for Itching for up to 10 days.   loratadine (CLARITIN) 10 MG tablet Take 10 mg by mouth daily as needed for allergies.   tiZANidine (ZANAFLEX) 4 MG tablet Take 1 tablet (4 mg total) by mouth at bedtime.   Vitamin D, Ergocalciferol, (DRISDOL) 1.25 MG (50000 UNIT) CAPS capsule Take 1 capsule (50,000 Units total) by mouth every 7 (seven) days.   [DISCONTINUED] ELDERBERRY PO Take 1 each by mouth daily.   [DISCONTINUED] Multiple Vitamins-Minerals (MULTIVITAMINS THER. W/MINERALS) TABS Take 1 tablet by mouth daily.   [DISCONTINUED] gabapentin (NEURONTIN) 100 MG capsule Take 2 capsules (200 mg total) by mouth at bedtime.   No facility-administered encounter medications on file as of 12/07/2021.    ALLERGIES:  Allergies  Allergen Reactions   Latex Other (See Comments)    Sensitivity only   Sulfa Antibiotics Hives   Sulfamethoxazole-Trimethoprim Rash   Septra [Bactrim] Rash     FAMILY HISTORY:  Family History  Problem Relation Age of Onset   Rheum arthritis Mother    Stroke Mother    Hypertension Mother    Heart disease Mother    Arthritis Mother    Breast cancer Mother    Heart attack Mother 39   Diabetes Father    Heart disease Father    Hyperlipidemia Father    Hypertension Father    Heart attack Father 62   Heart disease  Maternal Grandmother    Heart attack Maternal Grandmother 88   Arthritis Maternal Grandmother    Early death Maternal Grandfather        MVA   Diabetes Paternal Grandmother    Hypertension Paternal Grandmother    Heart disease Paternal Grandmother    Heart attack Paternal Grandmother 87   Heart disease Paternal Grandfather    Heart attack Paternal Grandfather 92   Coronary artery disease Other    Glaucoma Other    Non-Hodgkin's lymphoma Paternal Uncle      SOCIAL HISTORY:  Social Connections: Not on file    REVIEW OF SYSTEMS:  + Decreased appetite, palpitations, dyspareunia, urinary frequency, menstrual problems, pelvic pain, vaginal bleeding, back pain, anxiety Denies fevers, chills, fatigue, unexplained weight changes. Denies hearing loss, neck lumps or masses, mouth sores, ringing in ears or voice changes. Denies cough or wheezing.  Denies shortness of breath. Denies chest pain. Denies leg swelling. Denies abdominal distention, pain, blood in stools, constipation, diarrhea, nausea, vomiting, or early satiety. Denies dysuria, hematuria or incontinence. Denies hot flashes.   Denies joint pain or muscle pain/cramps. Denies itching, rash, or wounds. Denies dizziness, headaches, numbness or seizures. Denies swollen lymph nodes or glands, denies easy bruising or bleeding. Denies depression, confusion, or decreased concentration.  Physical Exam:  Vital Signs for this encounter:  Blood pressure 116/75, pulse (!) 125, temperature 98.5 F (36.9 C), temperature source Oral, resp. rate 18, height 5' (1.524 m), weight 114 lb 4 oz (51.8 kg), SpO2 100 %. Body mass index is 22.31 kg/m. General: Alert, oriented, no acute distress.  HEENT: Normocephalic, atraumatic. Sclera anicteric.  Chest: Clear to auscultation bilaterally, mildly decreased breath sounds along the right lung base. No wheezes, rhonchi, or rales. Cardiovascular: Tachycardic, regular rhythm, no murmurs, rubs, or gallops.   Abdomen: Normoactive bowel sounds.  Fluid  wave appreciated.  Large, solid and firm mass extends 4-6 cm above the umbilicus, feels most of the lower abdomen and pelvis.  Abdomen is nontender to palpation.  Extremities: Grossly normal range of motion. Warm, well perfused. No edema bilaterally.  Skin: No rashes or lesions.  Lymphatics: No cervical, supraclavicular, or inguinal adenopathy.  GU:  Normal external female genitalia.  No lesions. No discharge or bleeding.             Bladder/urethra:  No lesions or masses, well supported bladder             Vagina: Well rugated, no lesions noted.               Cervix: Cervix normal in appearance but somewhat difficult to see, located somewhat behind the pubic symphysis and deviated to the left.             Uterus: Small, deviated to the left and anteriorly with cervix palpated behind the symphysis.  Within the cul-de-sac, there is fluid and nodularity palpated.               Adnexa: Large mass palpated best on abdominal exam, limited movement secondary to weight.  Rectal: Deferred.  LABORATORY AND RADIOLOGIC DATA:  Outside medical records were reviewed to synthesize the above history, along with the history and physical obtained during the visit.   Lab Results  Component Value Date   WBC 16.4 (H) 12/08/2021   HGB 11.9 (L) 12/08/2021   HCT 36.3 12/08/2021   PLT 584 (H) 12/08/2021   GLUCOSE 126 (H) 12/07/2021   LDLDIRECT 105 (H) 03/23/2011   ALT 10 12/07/2021   AST 11 (L) 12/07/2021   NA 137 12/07/2021   K 4.3 12/07/2021   CL 101 12/07/2021   CREATININE 0.67 12/07/2021   BUN 9 12/07/2021   CO2 28 12/07/2021   INR 1.1 12/08/2021   MRI pelvis on 10/19: IMPRESSION: 1. 21.1 by 10.9 by 18.7 cm abdominopelvic mass, with a satellite mass in the right upper quadrant anteriorly, and moderate ascites with tumor deposits along the pelvic ascites posteriorly. The mass could be arising from right or left ovary, or both. The mass displaces the bowel  and IVC, but there is no IVC thrombus or pelvic DVT identified. 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 2. Additional satellite mass above the liver is partially included, along with tumor deposits along the ascites. 3. Low-level edema along the lateral abdominal wall musculature. 4. Flattening of the IVC at the level of the aortic bifurcation due to the large abdominopelvic tumor. No findings of pelvic DVT.

## 2021-12-07 NOTE — Progress Notes (Unsigned)
[name], MD  Allen Kell, NT Ok   D/w Dr Berline Lopes  First try:  [place] para for cytology -- call rad if you cant find a good pocket  If not enough ascites . . .   [place] core biopsy RUQ mass   DDH        Previous Messages    ----- Message -----  From: Allen Kell, NT  Sent: 12/07/2021   1:35 PM EDT  To: Ir Procedure Requests  Subject: CT Biopsy                                       Procedure:CT Biopsy   Reason: new widely metastatic gyn malignancy    History: MRI and CT in chart   Provider:TUCKER, KATHERINE R   Contact:[phone]

## 2021-12-07 NOTE — Telephone Encounter (Signed)
I spoke to [facility] CMA at Dr. Londell Moh office requesting pending lab results,(CEA,CA125, AFP) from 12/01/21 done at their office. Janett Billow stated she called the lab and results should be released by Thursday 10/26. Janett Billow stated she would call me directly and fax them once they return.  Dr. Berline Lopes notified.  Pt is also aware lab appointment for today at 11:30, order placed by [facility] for CMP/CBC. She agrees to date/time

## 2021-12-07 NOTE — Patient Instructions (Addendum)
Plan to have a CT scan of the chest and abdomen today and Dr. Berline Lopes will reach out with the results. We have also scheduled a doppler which is an ultrasound of your legs to rule out blood clots.   We will place an urgent referral to cardiology for further evaluation of the heart racing symptoms you are having.   Based on the CT results, doppler results, if surgery is recommended, we will see you back in the office closer to the date for a pre-op appointment with Joylene John, NP for Dr. Berline Lopes. You will also receive a phone call from the [facility] to arrange for a preop appointment there as well.   Dr. Berline Lopes will call you at the end of today to discuss CT results and next steps. If we are going to move towards setting up surgery, we will decide whether that will be at [facility] or at [facility] and when that is likely to happen. If we are going to start with chemotherapy, we will work to get you an appt with our medical oncologist, Dr. Alvy Bimler.

## 2021-12-07 NOTE — Progress Notes (Signed)
[place] Note  [name] and I worked closely together at [facility] years ago, and she reached out for support regarding her unfolding diagnosis and treatment plan.   Met with [name], accompanied by her dad and her husband [name], briefly prior to this morning's appointment with Dr Berline Lopes. Brought a bag of encouraging items including a handmade blessing blanket as a tangible symbol of comfort and support. Provided pastoral presence, empathic listening, and normalization of feelings. Assisting with counseling referrals/suggestions to explore per per request.  Will continue to follow for support as desired.   [place], [place], [facility] Pager [phone] Voicemail [phone]

## 2021-12-07 NOTE — Telephone Encounter (Signed)
I called the patient at 2 2 plaints after her visit with me this morning.  1 in the afternoon to review imaging results and the second 2 check in and make sure that she did not have additional questions since our conversations.  I reviewed that Dopplers of her legs as well as CT of her chest showed no DVT or PE.  Unfortunately, the CT of her abdomen shows a large, approximately 10 cm, subcapsular liver implant with concern for invasion.  Given this finding, I favor pursuing biopsy and starting with neoadjuvant chemotherapy.  We have previously discussed the option of surgical debulking.  While it would still be possible for me to debulk her large mass (would improve her abdominal symptoms), I worry that this would delay getting her started on systemic therapy for at least 3 or 4 weeks which might allow further growth of her subcapsular liver lesion.  After discussing this with her, she was amenable to proceeding with biopsy.  I placed an order for CT-guided biopsy as well as port placement.  I have spoken with Dr. Alvy Bimler who will see the patient later this week with the hopes of getting her started on chemotherapy next week once we have a diagnosis.  Also discussed placing a referral for ambulatory genetics, which we had discussed during her initial visit with me this morning.  Jeral Pinch MD Gynecologic Oncology

## 2021-12-08 ENCOUNTER — Other Ambulatory Visit: Payer: Self-pay | Admitting: Student

## 2021-12-08 ENCOUNTER — Ambulatory Visit (HOSPITAL_COMMUNITY)
Admission: RE | Admit: 2021-12-08 | Discharge: 2021-12-08 | Disposition: A | Discharge status: Home / Self Care | Payer: 59 | Source: Ambulatory Visit | Attending: Interventional Radiology | Admitting: Interventional Radiology

## 2021-12-08 ENCOUNTER — Other Ambulatory Visit: Payer: Self-pay | Admitting: Gynecologic Oncology

## 2021-12-08 ENCOUNTER — Other Ambulatory Visit (HOSPITAL_COMMUNITY): Payer: Self-pay

## 2021-12-08 ENCOUNTER — Other Ambulatory Visit: Payer: Self-pay

## 2021-12-08 ENCOUNTER — Other Ambulatory Visit: Payer: Self-pay | Admitting: Radiology

## 2021-12-08 ENCOUNTER — Telehealth: Payer: Self-pay | Admitting: Genetic Counselor

## 2021-12-08 ENCOUNTER — Other Ambulatory Visit (HOSPITAL_COMMUNITY): Payer: Self-pay | Admitting: Student

## 2021-12-08 ENCOUNTER — Telehealth: Payer: Self-pay

## 2021-12-08 ENCOUNTER — Other Ambulatory Visit: Payer: Self-pay | Admitting: Hematology and Oncology

## 2021-12-08 ENCOUNTER — Encounter: Payer: Self-pay | Admitting: Hematology and Oncology

## 2021-12-08 ENCOUNTER — Other Ambulatory Visit (HOSPITAL_COMMUNITY): Payer: Self-pay | Admitting: Interventional Radiology

## 2021-12-08 ENCOUNTER — Ambulatory Visit (HOSPITAL_COMMUNITY)
Admission: RE | Admit: 2021-12-08 | Discharge: 2021-12-08 | Disposition: A | Discharge status: Home / Self Care | Payer: 59 | Source: Ambulatory Visit | Attending: Gynecologic Oncology | Admitting: Gynecologic Oncology

## 2021-12-08 DIAGNOSIS — R180 Malignant ascites: Secondary | ICD-10-CM

## 2021-12-08 DIAGNOSIS — E278 Other specified disorders of adrenal gland: Secondary | ICD-10-CM | POA: Insufficient documentation

## 2021-12-08 DIAGNOSIS — C579 Malignant neoplasm of female genital organ, unspecified: Secondary | ICD-10-CM

## 2021-12-08 DIAGNOSIS — N9489 Other specified conditions associated with female genital organs and menstrual cycle: Secondary | ICD-10-CM | POA: Insufficient documentation

## 2021-12-08 DIAGNOSIS — C800 Disseminated malignant neoplasm, unspecified: Secondary | ICD-10-CM

## 2021-12-08 DIAGNOSIS — C569 Malignant neoplasm of unspecified ovary: Secondary | ICD-10-CM | POA: Insufficient documentation

## 2021-12-08 DIAGNOSIS — R000 Tachycardia, unspecified: Secondary | ICD-10-CM

## 2021-12-08 DIAGNOSIS — D473 Essential (hemorrhagic) thrombocythemia: Secondary | ICD-10-CM

## 2021-12-08 DIAGNOSIS — R1084 Generalized abdominal pain: Secondary | ICD-10-CM | POA: Diagnosis present

## 2021-12-08 DIAGNOSIS — D75838 Other thrombocytosis: Secondary | ICD-10-CM

## 2021-12-08 DIAGNOSIS — R1900 Intra-abdominal and pelvic swelling, mass and lump, unspecified site: Secondary | ICD-10-CM

## 2021-12-08 HISTORY — PX: IR US GUIDE BX ASP/DRAIN: IMG2392

## 2021-12-08 HISTORY — PX: IR IMAGING GUIDED PORT INSERTION: IMG5740

## 2021-12-08 LAB — CBC
HCT: 36.3 % (ref 36.0–46.0)
Hemoglobin: 11.9 g/dL — ABNORMAL LOW (ref 12.0–15.0)
MCH: 30.8 pg (ref 26.0–34.0)
MCHC: 32.8 g/dL (ref 30.0–36.0)
MCV: 94 fL (ref 80.0–100.0)
Platelets: 584 10*3/uL — ABNORMAL HIGH (ref 150–400)
RBC: 3.86 MIL/uL — ABNORMAL LOW (ref 3.87–5.11)
RDW: 12.6 % (ref 11.5–15.5)
WBC: 16.4 10*3/uL — ABNORMAL HIGH (ref 4.0–10.5)
nRBC: 0 % (ref 0.0–0.2)

## 2021-12-08 LAB — PROTIME-INR
INR: 1.1 (ref 0.8–1.2)
Prothrombin Time: 14.4 seconds (ref 11.4–15.2)

## 2021-12-08 MED ORDER — FENTANYL CITRATE (PF) 100 MCG/2ML IJ SOLN
INTRAMUSCULAR | Status: AC | PRN
  Administered 2021-12-08: 50 ug via INTRAVENOUS

## 2021-12-08 MED ORDER — MIDAZOLAM HCL 2 MG/2ML IJ SOLN
INTRAMUSCULAR | Status: AC
  Filled 2021-12-08: qty 2

## 2021-12-08 MED ORDER — MIDAZOLAM HCL 2 MG/2ML IJ SOLN
INTRAMUSCULAR | Status: AC | PRN
  Administered 2021-12-08: 1 mg via INTRAVENOUS

## 2021-12-08 MED ORDER — FENTANYL CITRATE (PF) 100 MCG/2ML IJ SOLN
INTRAMUSCULAR | Status: AC
  Filled 2021-12-08: qty 2

## 2021-12-08 MED ORDER — MIDAZOLAM HCL 2 MG/2ML IJ SOLN
INTRAMUSCULAR | Status: AC | PRN
  Administered 2021-12-08 (×2): 1 mg via INTRAVENOUS

## 2021-12-08 MED ORDER — FENTANYL CITRATE (PF) 100 MCG/2ML IJ SOLN
INTRAMUSCULAR | Status: AC | PRN
  Administered 2021-12-08 (×2): 50 ug via INTRAVENOUS

## 2021-12-08 MED ORDER — LIDOCAINE-EPINEPHRINE 1 %-1:100000 IJ SOLN
INTRAMUSCULAR | Status: AC
  Filled 2021-12-08: qty 1

## 2021-12-08 MED ORDER — HEPARIN SOD (PORK) LOCK FLUSH 100 UNIT/ML IV SOLN
INTRAVENOUS | Status: AC
  Filled 2021-12-08: qty 5

## 2021-12-08 MED ORDER — GELATIN ABSORBABLE 12-7 MM EX MISC
CUTANEOUS | Status: AC
  Filled 2021-12-08: qty 1

## 2021-12-08 MED ORDER — SODIUM CHLORIDE 0.9 % IV SOLN: INTRAVENOUS | Status: DC

## 2021-12-08 NOTE — Sedation Documentation (Signed)
Biopsy Started

## 2021-12-08 NOTE — Progress Notes (Signed)
START ON PATHWAY REGIMEN - Ovarian     A cycle is every 21 days:     Paclitaxel      Carboplatin   **Always confirm dose/schedule in your pharmacy ordering system**  Patient Characteristics: Preoperative or Nonsurgical Candidate (Clinical Staging), Newly Diagnosed, Neoadjuvant Therapy followed by Surgery BRCA Mutation Status: Awaiting Test Results Therapeutic Status: Preoperative or Nonsurgical Candidate (Clinical Staging) AJCC T Category: cT3c AJCC 8 Stage Grouping: IIIC AJCC N Category: cN0 AJCC M Category: cM0 Therapy Plan: Neoadjuvant Therapy followed by Surgery Intent of Therapy: Curative Intent, Discussed with Patient

## 2021-12-08 NOTE — Telephone Encounter (Signed)
Patient called to let me know that she was at [facility] to do her biopsy and that they were going to do the port placement while she was there. I informed Dr Berline Lopes as well..   Also, sent Erin (Dr Calton Dach nurse) a message to let her know I was going to schedule her for appointment on 10/27 with Dr Alvy Bimler.  I informed patient of the appointment as well.  10/27 at 1:00pm.  She confirmed and understood.

## 2021-12-08 NOTE — Telephone Encounter (Signed)
Scheduled appt per 10/24 referral. Pt is aware of appt date and time. Pt is aware to arrive 15 mins prior to appt time and to bring and updated insurance card. Pt is aware of appt location.   

## 2021-12-08 NOTE — Telephone Encounter (Signed)
Per Dr Lulu Riding message.  Called and had patient port placement appointment scheduled.  Spoke with Janett Billow at [phone] to get appointment time and place.  Explained to patient appointment time and date 12/09/21 she is to arrive at 12:30pm at short stay.  Nothing to eat or drink after 8am tomorrow morning, only sips of water with morning medications, would need a driver and also 24 hour monitoring afterwards.  Patient confirmed and understood.

## 2021-12-08 NOTE — Progress Notes (Signed)
The patient and spouse received written and verbal d/c orders. All questions and concerns addressed and cath card given to pt. PIV removed and intact. All dressings intact, clean and dry.

## 2021-12-08 NOTE — H&P (Signed)
Chief Complaint: Patient was seen in consultation today for pelvic mass biopsy and port-a-catheter placement.   Referring Physician(s): Lafonda Mosses  Supervising Physician: Mir, Sharen Heck  Patient Status: [facility] - Out-pt  History of Present Illness: [name] is a 51 y.o. female with a medical history significant for endometriosis, anxiety and kidney stones. She was evaluated 12/01/21 for new onset abnormal uterine bleeding and abdominal pelvic fullness. She also complained of increased urinary frequency. Office ultrasound showed a uterus deviated to the left with increased vascularity in the myometrium. The right ovary was noted to have a 16 cm heterogenous cystic and solid-appearing vascular mass. A pelvic MRI 12/02/21 showed a multi-lobulated mass abutting both adnexa and a satellite mass in the RUQ.   MR Pelvis 12/02/21 IMPRESSION: 1. 21.1 by 10.9 by 18.7 cm abdominopelvic mass, with a satellite mass in the right upper quadrant anteriorly, and moderate ascites with tumor deposits along the pelvic ascites posteriorly. The mass could be arising from right or left ovary, or both. The mass displaces the bowel and IVC, but there is no IVC thrombus or pelvic DVT identified. 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 2. Additional satellite mass above the liver is partially included, along with tumor deposits along the ascites. 3. Low-level edema along the lateral abdominal wall musculature. 4. Flattening of the IVC at the level of the aortic bifurcation due to the large abdominopelvic tumor. No findings of pelvic DVT.  CT abdomen with contrast 12/07/21 showed similar findings. A CTA Chest PE on 12/07/21 was negative for PE.   GYN/Oncology has requested for IR to evaluate this procedure for an image-guided right upper quadrant mass biopsy, possible paracentesis and port-a-catheter placement. Imaging reviewed and procedure approved by Dr.  Vernard Gambles.    Past Medical History:  Diagnosis Date   Arthritis    Endometriosis    Family history of breast cancer in mother    at age 48   GAD (generalized anxiety disorder)    lexapro helped 2017 (Dr. Hoy Finlay did have extra stress of the Master's degree program on her at that time.  Got counseling.       GERD (gastroesophageal reflux disease)    Gestational diabetes    Kidney stones    Low back pain    Dr. Charlann Boxer.   Neck pain    and left shoulder pain: managed by osteopathic manipulation by Dr. Charlann Boxer. ?cervical radiculopathy   PAC (premature atrial contraction)     Past Surgical History:  Procedure Laterality Date   ABDOMINAL EXPLORATION SURGERY  2009   fulguration of endometriosis - [facility] surgery   SALPINGECTOMY Right 2009   TONSILLECTOMY     TUBAL LIGATION Left 2009    Allergies: Latex, Sulfa antibiotics, Sulfamethoxazole-trimethoprim, and Septra [bactrim]  Medications: Prior to Admission medications   Medication Sig Start Date End Date Taking? Authorizing Provider  famotidine (PEPCID) 10 MG tablet Take 10 mg by mouth daily as needed for heartburn or indigestion.   Yes [provider]  hydrOXYzine (ATARAX/VISTARIL) 25 MG tablet Take one tablet (25 mg dose) by mouth 3 (three) times a day as needed for Itching for up to 10 days. 10/01/20  Yes   tiZANidine (ZANAFLEX) 4 MG tablet Take 1 tablet (4 mg total) by mouth at bedtime. 08/27/18  Yes Lyndal Pulley, DO  Vitamin D, Ergocalciferol, (DRISDOL) 1.25 MG (50000 UNIT) CAPS capsule Take 1 capsule (50,000 Units total) by mouth every 7 (seven) days. 03/17/21  Yes  Hulan Saas M, DO  apixaban (ELIQUIS) 2.5 MG TABS tablet Take 1 tablet (2.5 mg total) by mouth 2 (two) times daily. Wait to start this until after you have had your procedures 12/07/21   Lafonda Mosses, MD  loratadine (CLARITIN) 10 MG tablet Take 10 mg by mouth daily as needed for allergies.    [provider]     Family History   Problem Relation Age of Onset   Rheum arthritis Mother    Stroke Mother    Hypertension Mother    Heart disease Mother    Arthritis Mother    Breast cancer Mother    Heart attack Mother 27   Diabetes Father    Heart disease Father    Hyperlipidemia Father    Hypertension Father    Heart attack Father 53   Heart disease Maternal Grandmother    Heart attack Maternal Grandmother 88   Arthritis Maternal Grandmother    Early death Maternal Grandfather        MVA   Diabetes Paternal Grandmother    Hypertension Paternal Grandmother    Heart disease Paternal Grandmother    Heart attack Paternal Grandmother 53   Heart disease Paternal Grandfather    Heart attack Paternal Grandfather 92   Coronary artery disease Other    Glaucoma Other    Non-Hodgkin's lymphoma Paternal Uncle     Social History   Socioeconomic History   Marital status: Married    Spouse name: Not on file   Number of children: 3   Years of education: Not on file   Highest education level: Not on file  Occupational History   Occupation: Optician, dispensing: [employer]    Comment: at [employer]   Tobacco Use   Smoking status: Never   Smokeless tobacco: Never  Vaping Use   Vaping Use: Never used  Substance and Sexual Activity   Alcohol use: Yes    Comment: Occasionally   Drug use: No   Sexual activity: Yes    Partners: Male    Birth control/protection: Surgical  Other Topics Concern   Not on file  Social History Narrative   Married, 3 kids.    Lives in [place].   Educ: Masters deg nursing.   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   No tob/drugs.   Alc: social.   Social Determinants of Health   Financial Resource Strain: Not on file  Food Insecurity: Not on file  Transportation Needs: Not on file  Physical Activity: Not on file  Stress: Not on file  Social Connections: Not on file    Review of Systems: A 12 point  ROS discussed and pertinent positives are indicated in the HPI above.  All other systems are negative.  Review of Systems  Constitutional:  Negative for appetite change and fatigue.  Respiratory:  Positive for shortness of breath. Negative for cough.   Cardiovascular:  Negative for chest pain and leg swelling.  Gastrointestinal:  Positive for abdominal distention. Negative for diarrhea, nausea and vomiting.  Neurological:  Negative for dizziness and headaches.    Vital Signs: BP 132/78   Pulse (!) 112   Temp 98.4 F (36.9 C) (Oral)   Resp 15   Ht 5' (1.524 m)   Wt 114 lb (51.7 kg)   LMP  (LMP Unknown) Comment: tubal ligation  SpO2 97%   BMI 22.26  kg/m   Physical Exam Constitutional:      General: She is not in acute distress.    Appearance: She is not ill-appearing.  HENT:     Mouth/Throat:     Mouth: Mucous membranes are moist.     Pharynx: Oropharynx is clear.  Cardiovascular:     Rate and Rhythm: Regular rhythm. Tachycardia present.     Pulses: Normal pulses.     Heart sounds: Normal heart sounds.  Pulmonary:     Effort: Pulmonary effort is normal.     Breath sounds: Normal breath sounds.  Abdominal:     General: There is distension.     Palpations: There is mass.  Musculoskeletal:     Right lower leg: No edema.     Left lower leg: No edema.  Skin:    General: Skin is warm and dry.  Neurological:     Mental Status: She is alert and oriented to person, place, and time.  Psychiatric:        Mood and Affect: Mood normal.        Behavior: Behavior normal.        Thought Content: Thought content normal.        Judgment: Judgment normal.     Imaging: VAS [place] LOWER EXTREMITY VENOUS (DVT)  Result Date: 12/07/2021  Lower Venous DVT Study Patient Name:  [name]  Date of Exam:   12/07/2021 Medical Rec #: [id number]       Accession #:    [id number] Date of Birth: September 11, 1970       Patient Gender: F Patient Age:   60 years Exam Location:  [facility]  Procedure:      VAS [place] LOWER EXTREMITY VENOUS (DVT) Referring Phys: Jeral Pinch --------------------------------------------------------------------------------  Indications: Abdominopelvic mass with compression of IVC.  Comparison Study: No prior studies. Performing Technologist: Darlin Coco RDMS, RVT  Examination Guidelines: A complete evaluation includes B-mode imaging, spectral Doppler, color Doppler, and power Doppler as needed of all accessible portions of each vessel. Bilateral testing is considered an integral part of a complete examination. Limited examinations for reoccurring indications may be performed as noted. The reflux portion of the exam is performed with the patient in reverse Trendelenburg.  +---------+---------------+---------+-----------+----------+--------------+ RIGHT    CompressibilityPhasicitySpontaneityPropertiesThrombus Aging +---------+---------------+---------+-----------+----------+--------------+ CFV      Full           Yes      Yes                                 +---------+---------------+---------+-----------+----------+--------------+ SFJ      Full                                                        +---------+---------------+---------+-----------+----------+--------------+ FV Prox  Full                                                        +---------+---------------+---------+-----------+----------+--------------+ FV Mid   Full                                                        +---------+---------------+---------+-----------+----------+--------------+  FV DistalFull                                                        +---------+---------------+---------+-----------+----------+--------------+ PFV      Full                                                        +---------+---------------+---------+-----------+----------+--------------+ POP      Full           Yes      Yes                                  +---------+---------------+---------+-----------+----------+--------------+ PTV      Full                                                        +---------+---------------+---------+-----------+----------+--------------+ PERO     Full                                                        +---------+---------------+---------+-----------+----------+--------------+ Gastroc  Full                                                        +---------+---------------+---------+-----------+----------+--------------+   +---------+---------------+---------+-----------+----------+--------------+ LEFT     CompressibilityPhasicitySpontaneityPropertiesThrombus Aging +---------+---------------+---------+-----------+----------+--------------+ CFV      Full           Yes      Yes                                 +---------+---------------+---------+-----------+----------+--------------+ SFJ      Full                                                        +---------+---------------+---------+-----------+----------+--------------+ FV Prox  Full                                                        +---------+---------------+---------+-----------+----------+--------------+ FV Mid   Full                                                        +---------+---------------+---------+-----------+----------+--------------+  FV DistalFull                                                        +---------+---------------+---------+-----------+----------+--------------+ PFV      Full                                                        +---------+---------------+---------+-----------+----------+--------------+ POP      Full           Yes      Yes                                 +---------+---------------+---------+-----------+----------+--------------+ PTV      Full                                                         +---------+---------------+---------+-----------+----------+--------------+ PERO     Full                                                        +---------+---------------+---------+-----------+----------+--------------+ Gastroc  Full                                                        +---------+---------------+---------+-----------+----------+--------------+     Summary: RIGHT: - There is no evidence of deep vein thrombosis in the lower extremity.  - No cystic structure found in the popliteal fossa.  LEFT: - There is no evidence of deep vein thrombosis in the lower extremity.  - No cystic structure found in the popliteal fossa.  *See table(s) above for measurements and observations. Electronically signed by Deitra Mayo MD on 12/07/2021 at 12:56:56 PM.    Final    CT ABDOMEN W CONTRAST  Result Date: 12/07/2021 CLINICAL DATA:  Pelvic mass. Malignant ascites. Metastatic disease evaluation. * Tracking Code: BO * EXAM: CT ABDOMEN WITH CONTRAST TECHNIQUE: Multidetector CT imaging of the abdomen was performed using the standard protocol following bolus administration of intravenous contrast. RADIATION DOSE REDUCTION: This exam was performed according to the departmental dose-optimization program which includes automated exposure control, adjustment of the mA and/or kV according to patient size and/or use of iterative reconstruction technique. CONTRAST:  66m OMNIPAQUE IOHEXOL 350 MG/ML SOLN COMPARISON:  Pelvic MRI 12/02/2021.  Abdominopelvic CT 09/29/2005. FINDINGS: Lower chest:  Chest findings dictated separately. Hepatobiliary: There is a large heterogeneous, partially calcified mass along the superior aspect of the right hepatic lobe which is likely subcapsular in origin based on the reformatted images. This measures approximately 10.3 x 8.0 x 5.1 cm and shows heterogeneous enhancement following contrast. There may be invasion of the liver,  especially by a 1.8 cm component on coronal  image 45/5. No other separate hepatic lesions. No evidence of gallstones, gallbladder wall thickening or biliary dilatation. Pancreas: Unremarkable. No pancreatic ductal dilatation or surrounding inflammatory changes. Spleen: Normal in size without focal abnormality. Adrenals/Urinary Tract: Both adrenal glands appear normal. No evidence of urinary tract calculus, suspicious renal lesion or hydronephrosis Bladder not imaged. Stomach/Bowel: No enteric contrast administered. The stomach appears unremarkable for its degree of distention. The visualized bowel demonstrates no wall thickening, distention or surrounding inflammation. The appendix appears normal. Vascular/Lymphatic: There are no enlarged abdominal lymph nodes. Aortic and branch vessel atherosclerosis without evidence of large vessel occlusion or aneurysm. The portal, superior mesenteric and splenic veins appear patent. Other: Small amount of abdominal ascites. As above, there is a large subcapsular metastasis involving the superior aspect of the liver. There is a right omental implant measuring 6.9 x 3.5 cm on image 28/2. There is a very large central mass involving the upper abdomen and pelvis, incompletely visualized by this CT of the abdomen, but measuring up to 16.5 x 12.2 cm on image 37/2. This was seen on recent pelvic MRI. Musculoskeletal: No acute or significant osseous findings. IMPRESSION: 1. Large central abdominal and pelvic mass consistent with known malignancy. This is incompletely visualized by this CT of the abdomen, but was seen on recent pelvic MRI. 2. 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 Right omental implant consistent with metastatic disease. Small amount of abdominal ascites. 3. No evidence for bowel or ureteral obstruction. 4. Aortic Atherosclerosis (ICD10-I70.0). 5. Chest findings dictated separately. Electronically Signed   By: Richardean Sale M.D.    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  Cough. EXAM: CT ANGIOGRAPHY CHEST WITH CONTRAST TECHNIQUE: Multidetector CT imaging of the chest was performed using the standard protocol during bolus administration of intravenous contrast. Multiplanar CT image reconstructions and MIPs were obtained to evaluate the vascular anatomy. RADIATION DOSE REDUCTION: This exam was performed according to the departmental dose-optimization program which includes automated exposure control, adjustment of the mA and/or kV according to patient size and/or use of iterative reconstruction technique. CONTRAST:  56m OMNIPAQUE IOHEXOL 350 MG/ML SOLN COMPARISON:  CT Abdomen today reported separately. FINDINGS: Cardiovascular: Excellent contrast bolus timing in the pulmonary arterial tree. Mild respiratory motion. No pulmonary artery filling defect. No calcified coronary artery atherosclerosis is evident. Negative visible aorta. No cardiomegaly or pericardial effusion. Mediastinum/Nodes: No mediastinal mass or lymphadenopathy. Lungs/Pleura: Major airways are patent. Both lungs are clear except for minor dependent atelectasis. No pulmonary nodule, mass, pleural effusion, consolidation. Upper Abdomen: Reported separately today, liver mass and ascites are visible. Musculoskeletal: No acute or suspicious osseous lesion in the chest. Review of the MIP images confirms the above findings. IMPRESSION: 1. Negative for pulmonary embolism. And no acute or metastatic process identified in the Chest. 2. Partially visible liver mass and ascites, staging CT Abdomen today is reported separately. Electronically Signed   By: HGenevie AnnM.D.   On: 12/07/2021 10:54   MR PELVIS W WO CONTRAST  Result Date: 12/02/2021 CLINICAL DATA:  Pelvic mass for 3 months. EXAM: MRI PELVIS WITHOUT AND WITH CONTRAST TECHNIQUE: Multiplanar multisequence MR  imaging of the pelvis was performed both before and after administration of intravenous contrast. CONTRAST:  5 cc Vueway COMPARISON:  None Available. FINDINGS: Urinary  Tract:  Unremarkable where included Bowel: Bowel is displaced by the large abdominopelvic tumor. Otherwise unremarkable. Vascular/Lymphatic: Flattening of the IVC at the level of the aortic bifurcation for example on image 34 series 12 due to the abdominopelvic mass. No findings of IVC thrombus or pelvic DVT. Reproductive: Multilobulated and heterogeneously enhancing 21.1 by 10.9 by 18.7 cm (volume = 2250 cm^3) mass is present with composed opponents abutting both adnexa. A satellite mass in the right upper quadrant anteriorly measures 6.9 by 4.1 by 6.4 cm (volume = 95 cm^3) on image 15 of series 4. There is moderate ascites with tumor deposits along the pelvic ascites posteriorly for example on images 79 through 87 of series 16. The mass primarily extends above the uterus, and could be arising from the right ovary, left ovary, or both. There is abnormal enhancement along ascites margins in the paracolic gutter most compatible with malignant ascites. Today's exam was not meant to include the upper abdomen as this is an MRI of the pelvis. However, coronal images include enough of upper abdomen to indicate that there is likely a at least 7 cm tumor deposit just above the liver (image 14, series 2). Other:  No supplemental non-categorized findings. Musculoskeletal: Low-level edema along the lateral abdominal wall musculature for example on image 6 series 4, otherwise unremarkable. IMPRESSION: 1. 21.1 by 10.9 by 18.7 cm abdominopelvic mass, with a satellite mass in the right upper quadrant anteriorly, and moderate ascites with tumor deposits along the pelvic ascites posteriorly. The mass could be arising from right or left ovary, or both. The mass displaces the bowel and IVC, but there is no IVC thrombus or pelvic DVT identified. Appearance strongly  favors ovarian malignancy with peritoneal spread of tumor. 2. Additional satellite mass above the liver is partially included, along with tumor deposits along the ascites. 3. Low-level edema along the lateral abdominal wall musculature. 4. Flattening of the IVC at the level of the aortic bifurcation due to the large abdominopelvic tumor. No findings of pelvic DVT. Electronically Signed   By: Van Clines M.D.   On: 12/02/2021 16:31    Labs:  CBC: Recent Labs    12/07/21 1123 12/08/21 0837  WBC 19.3* 16.4*  HGB 12.4 11.9*  HCT 37.9 36.3  PLT 610* 584*    COAGS: Recent Labs    12/08/21 0837  INR 1.1    BMP: Recent Labs    12/07/21 1123  NA 137  K 4.3  CL 101  CO2 28  GLUCOSE 126*  BUN 9  CALCIUM 8.8*  CREATININE 0.67  GFRNONAA >60    LIVER FUNCTION TESTS: Recent Labs    12/07/21 1123  BILITOT 0.5  AST 11*  ALT 10  ALKPHOS 50  PROT 6.6  ALBUMIN 3.5    TUMOR MARKERS: No results for input(s): "AFPTM", "CEA", "CA199", "CHROMGRNA" in the last 8760 hours.  Assessment and Plan:  Pelvic mass, RUQ mass with metastases; pending chemotherapy: Kyrah Schiro. Mackel, 51 year old female, presents today to the [facility] department for an image-guided right upper quadrant mass biopsy, possible paracentesis and port-a-catheter placement.   Risks and benefits of this procedure were discussed with the patient and/or patient's family including, but not limited to bleeding, infection, damage to adjacent structures or low yield requiring additional tests.  Risks and benefits of image-guided Port-a-catheter placement were discussed with the patient including, but not limited to bleeding, infection, pneumothorax, or fibrin sheath development and need for additional procedures.  All of the patient's  questions were answered, patient is agreeable to proceed. She has been NPO.   Consent signed and in chart.  Thank you for this interesting consult.  I  greatly enjoyed meeting [name] and look forward to participating in their care.  A copy of this report was sent to the requesting provider on this date.  Electronically Signed: Soyla Dryer, AGACNP-BC [phone] 12/08/2021, 9:21 AM   I spent a total of  30 Minutes   in face to face in clinical consultation, greater than 50% of which was counseling/coordinating care for RUQ mass biopsy, possible paracentesis and port-a-catheter placement.

## 2021-12-08 NOTE — Procedures (Signed)
Interventional Radiology Procedure Note  Procedure: 1. Right Chest Port 2. Right abdominal mass biopsy  Indication: Pelvic mass with RUQ satellite lesion  Findings: Please refer to procedural dictation for full description.  Complications: None  EBL: < 10 mL  Miachel Roux, MD [phone]

## 2021-12-08 NOTE — Telephone Encounter (Signed)
Results received from Dr. Londell Moh office. Placed on Dr. Lulu Riding desk for review.  Dr. Berline Lopes notified

## 2021-12-09 ENCOUNTER — Other Ambulatory Visit: Payer: Self-pay

## 2021-12-09 ENCOUNTER — Ambulatory Visit: Payer: 59 | Attending: Internal Medicine | Admitting: Internal Medicine

## 2021-12-09 ENCOUNTER — Encounter: Payer: Self-pay | Admitting: Hematology and Oncology

## 2021-12-09 ENCOUNTER — Telehealth: Payer: Self-pay

## 2021-12-09 ENCOUNTER — Other Ambulatory Visit (HOSPITAL_COMMUNITY): Payer: 59

## 2021-12-09 ENCOUNTER — Encounter: Payer: Self-pay | Admitting: Internal Medicine

## 2021-12-09 VITALS — BP 110/72 | HR 105 | Ht 59.0 in | Wt 117.4 lb

## 2021-12-09 DIAGNOSIS — R000 Tachycardia, unspecified: Secondary | ICD-10-CM

## 2021-12-09 DIAGNOSIS — I700 Atherosclerosis of aorta: Secondary | ICD-10-CM | POA: Diagnosis not present

## 2021-12-09 NOTE — Patient Instructions (Signed)
Medication Instructions:  No changes *If you need a refill on your cardiac medications before your next appointment, please call your pharmacy*   Lab Work: Today: TSH Reflex T4 if abnormal  If you have labs (blood work) drawn today and your tests are completely normal, you will receive your results only by: [place] (if you have MyChart) OR A paper copy in the mail If you have any lab test that is abnormal or we need to change your treatment, we will call you to review the results.   Testing/Procedures: Your physician has requested that you have an echocardiogram. Echocardiography is a painless test that uses sound waves to create images of your heart. It provides your doctor with information about the size and shape of your heart and how well your heart's chambers and valves are working. This procedure takes approximately one hour. There are no restrictions for this procedure. Please do NOT wear cologne, perfume, aftershave, or lotions (deodorant is allowed). Please arrive 15 minutes prior to your appointment time.    Follow-Up: At [facility], you and your health needs are our priority.  As part of our continuing mission to provide you with exceptional heart care, we have created designated Provider Care Teams.  These Care Teams include your primary Cardiologist (physician) and Advanced Practice Providers (APPs -  Physician Assistants and Nurse Practitioners) who all work together to provide you with the care you need, when you need it.  Your next appointment:   6 month(s)  The format for your next appointment:   In Person  Provider:   Lenna Sciara, MD  Important Information About Sugar

## 2021-12-09 NOTE — Telephone Encounter (Signed)
Patient called in and wanted to ask questions about the after visit summary she received yesterday.  She had questions about her bandage over her port placement.  I explained to her that she could remove the bandage and just to leave the strips over the port placement as they would be protecting the site.  I explained she could shower after removal of the bandage and just to let the water run over the site and to pat it dry with a towel not rub over it or scrub it.  Patient confirmed and understood.

## 2021-12-10 ENCOUNTER — Telehealth: Payer: Self-pay | Admitting: Gynecologic Oncology

## 2021-12-10 ENCOUNTER — Encounter: Payer: Self-pay | Admitting: Hematology and Oncology

## 2021-12-10 ENCOUNTER — Other Ambulatory Visit: Payer: Self-pay

## 2021-12-10 ENCOUNTER — Inpatient Hospital Stay (HOSPITAL_BASED_OUTPATIENT_CLINIC_OR_DEPARTMENT_OTHER): Payer: 59 | Admitting: Hematology and Oncology

## 2021-12-10 ENCOUNTER — Other Ambulatory Visit (HOSPITAL_COMMUNITY): Payer: Self-pay

## 2021-12-10 ENCOUNTER — Telehealth: Payer: Self-pay

## 2021-12-10 VITALS — BP 130/82 | HR 124 | Resp 18 | Ht 59.0 in | Wt 116.8 lb

## 2021-12-10 DIAGNOSIS — D75838 Other thrombocytosis: Secondary | ICD-10-CM

## 2021-12-10 DIAGNOSIS — C569 Malignant neoplasm of unspecified ovary: Secondary | ICD-10-CM

## 2021-12-10 DIAGNOSIS — C801 Malignant (primary) neoplasm, unspecified: Secondary | ICD-10-CM | POA: Diagnosis not present

## 2021-12-10 DIAGNOSIS — C786 Secondary malignant neoplasm of retroperitoneum and peritoneum: Secondary | ICD-10-CM

## 2021-12-10 DIAGNOSIS — R1900 Intra-abdominal and pelvic swelling, mass and lump, unspecified site: Secondary | ICD-10-CM | POA: Diagnosis not present

## 2021-12-10 LAB — TSH RFX ON ABNORMAL TO FREE T4: TSH: 2.57 u[IU]/mL (ref 0.450–4.500)

## 2021-12-10 MED ORDER — LORAZEPAM 0.5 MG PO TABS
0.5000 mg | ORAL_TABLET | Freq: Three times a day (TID) | ORAL | 0 refills | Status: DC
  Filled 2021-12-10: qty 30, 10d supply, fill #0

## 2021-12-10 MED ORDER — ONDANSETRON HCL 8 MG PO TABS
8.0000 mg | ORAL_TABLET | Freq: Three times a day (TID) | ORAL | 1 refills | Status: DC | PRN
  Filled 2021-12-10: qty 30, 10d supply, fill #0

## 2021-12-10 MED ORDER — LIDOCAINE-PRILOCAINE 2.5-2.5 % EX CREA
TOPICAL_CREAM | CUTANEOUS | 3 refills | Status: DC
  Filled 2021-12-10: qty 30, 30d supply, fill #0

## 2021-12-10 MED ORDER — DEXAMETHASONE 4 MG PO TABS
8.0000 mg | ORAL_TABLET | Freq: Every day | ORAL | 1 refills | Status: DC
  Filled 2021-12-10: qty 30, 15d supply, fill #0

## 2021-12-10 MED ORDER — PROCHLORPERAZINE MALEATE 10 MG PO TABS
10.0000 mg | ORAL_TABLET | Freq: Four times a day (QID) | ORAL | 1 refills | Status: DC | PRN
  Filled 2021-12-10: qty 30, 8d supply, fill #0

## 2021-12-10 NOTE — Progress Notes (Signed)
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[facility]) 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 and beta HCG will support this diagnosis My original plan before I saw her would be to prescribe neoadjuvant chemotherapy with carboplatin and paclitaxel However, in view of recent change in diagnosis, I recommend we switch her treatment to combination of bleomycin, cisplatin and etoposide The risk, benefits, side effects of treatment were fully discussed with the patient and family and they are in agreement to proceed She will get blood work and chemo education class next week and then I will see her prior to cycle 1 of treatment on November 6 I recommend minimum 2 cycles of treatment before repeating imaging study We discussed the use of pulmonary function test to establish baseline prior to bleomycin treatment Due to poor prognosis feature, she would likely need 4 cycles of BEP  I will order tumor markers starting cycle 2 of therapy Due to the aggressive nature of her disease and high risk of bowel obstruction, I plan to see her every week for toxicity review We discussed the goal of treatment is of curative intent  Reactive thrombocytosis She is at high risk of thrombotic event I agree with the role of prophylactic anticoagulation therapy to avoid risk of blood clots  Orders Placed This Encounter  Procedures   CMP ([place] only)    Standing Status:   Future    Standing Expiration Date:   12/11/2022   CBC with Differential ([place] Only)     Standing Status:   Future    Standing Expiration Date:   12/11/2022   Magnesium    Standing Status:   Future    Standing Expiration Date:   12/11/2022   CBC with Differential ([place] Only)    Standing Status:   Future    Standing Expiration Date:   12/21/2022   CMP ([place] only)    Standing Status:   Future    Standing Expiration Date:   12/21/2022   Magnesium    Standing Status:   Future    Standing Expiration Date:   12/21/2022   CMP ([place] only)    Standing Status:   Future    Standing Expiration Date:   12/29/2022   CMP ([place] only)    Standing Status:   Future    Standing Expiration Date:   01/05/2023   CBC with Differential ([place] Only)    Standing Status:   Future    Standing Expiration Date:   01/11/2023   CMP ([place] only)    Standing Status:   Future    Standing Expiration Date:   01/11/2023   Magnesium    Standing Status:   Future    Standing Expiration Date:   01/11/2023   CMP ([place] only)    Standing Status:   Future    Standing Expiration Date:   01/19/2023   CMP ([place] only)    Standing Status:   Future    Standing Expiration Date:   01/26/2023   CBC with Differential ([place] Only)    Standing  Status:   Future    Standing Expiration Date:   02/01/2023   CMP ([place] only)    Standing Status:   Future    Standing Expiration Date:   02/01/2023   Magnesium    Standing Status:   Future    Standing Expiration Date:   02/01/2023   CMP ([place] only)    Standing Status:   Future    Standing Expiration Date:   02/09/2023   CMP ([place] only)    Standing Status:   Future    Standing Expiration Date:   02/16/2023   AFP tumor marker    Standing Status:   Future    Standing Expiration Date:   01/11/2023   Beta hCG quant (ref lab)    Standing Status:   Future    Standing Expiration Date:   01/11/2023   CA 125    Standing Status:   Future    Standing Expiration Date:   01/11/2023    AFP tumor marker    Standing Status:   Future    Standing Expiration Date:   02/01/2023   Beta hCG quant (ref lab)    Standing Status:   Future    Standing Expiration Date:   02/01/2023   CA 125    Standing Status:   Future    Standing Expiration Date:   02/01/2023   AFP tumor marker    Standing Status:   Future    Standing Expiration Date:   02/22/2023   Beta hCG quant (ref lab)    Standing Status:   Future    Standing Expiration Date:   02/22/2023   CA 125    Standing Status:   Future    Standing Expiration Date:   02/22/2023   CBC with Differential ([facility] Only)    Standing Status:   Future    Standing Expiration Date:   02/22/2023   CMP ([place] only)    Standing Status:   Future    Standing Expiration Date:   02/22/2023   Magnesium    Standing Status:   Future    Standing Expiration Date:   02/22/2023   CMP ([place] only)    Standing Status:   Future    Standing Expiration Date:   03/02/2023   CMP ([place] only)    Standing Status:   Future    Standing Expiration Date:   03/09/2023   Pulmonary Function Test    Standing Status:   Future    Standing Expiration Date:   12/11/2022    Order Specific Question:   Where should this test be performed?    Answer:   [facility]    Order Specific Question:   Full PFT: includes the following: basic spirometry, spirometry pre & post bronchodilator, diffusion capacity (DLCO), lung volumes    Answer:   FULL PFT Without spirometry post bronchodilator    The total time spent in the appointment was 80 minutes encounter with patients including review of chart and various tests results, discussions about plan of care and coordination of care plan   All questions were answered. The patient knows to call the clinic with any problems, questions or concerns. No barriers to learning was detected.  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  She has 3 children, oldest being a son and 2 daughters still at home in high school She has rapid abdominal discomfort that developed just approximately a month ago Prior to this, the patient has intentional 20 pounds weight loss gradually over the past year She developed abnormal heavy menstruation earlier this month and was evaluated by primary care doctor and was referred to gynecologist for evaluation She was noted to have a very large central abdominal mass.  CT imaging and blood work came back grossly abnormal Biopsy is still pending but preliminary results of her peritoneal biopsy is suspicious for malignant germ cell tumor of the ovary Currently, she has difficulties with early satiety, shortness of breath on minimal exertion and diffuse abdominal discomfort She denies problems with urination or bowel movement She has frequent symptoms of heartburn  I have reviewed her chart and materials related to her cancer extensively and collaborated history with the patient. Summary of oncologic history is as follows: Oncology History  Malignant germ cell tumor of ovary ([place])  12/01/2021 Tumor Marker   CA-125 is elevated at 285, [place]-fet[place] at [address], beta-hCG is elevated at 116   12/02/2021 Imaging   MRI pelvis  1. 21.1 by 10.9 by 18.7 cm abdominopelvic mass, with a satellite mass in the right upper quadrant anteriorly, and moderate ascites with tumor deposits along the pelvic ascites posteriorly. The mass could be arising from right or left ovary, or both. The mass displaces the bowel and IVC, but there is no IVC thrombus or pelvic DVT identified. Appearance strongly favors ovarian malignancy with peritoneal spread  of tumor. 2. Additional satellite mass above the liver is partially included, along with tumor deposits along the ascites. 3. Low-level edema along the lateral abdominal wall musculature. 4. Flattening of the IVC at the level of the aortic bifurcation due to the large abdominopelvic tumor. No findings of pelvic DVT.     12/07/2021 Imaging   CT abdomen and pelvis 1. Large central abdominal and pelvic mass consistent with known malignancy. This is incompletely visualized by this CT of the abdomen, but was seen on recent pelvic MRI. 2. 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 Right omental implant consistent with metastatic disease. Small amount of abdominal ascites. 3. No evidence for bowel or ureteral obstruction. 4. Aortic Atherosclerosis (ICD10-I70.0). 5. Chest findings dictated separately.   12/07/2021 Imaging   CT chest 1. Negative for pulmonary embolism. And no acute or metastatic process identified in the Chest. 2. Partially visible liver mass and ascites, staging CT Abdomen today is reported separately.     12/07/2021 Procedure   Successful placement of a right internal jugular approach power injectable Port-A-Cath. The catheter is ready for immediate use.   12/07/2021 Procedure   Ultrasound-guided biopsy of right upper quadrant mass.    12/08/2021 Initial Diagnosis   Ovarian cancer ([place])   12/08/2021 Cancer Staging   Staging form: Ovary, Fallopian Tube, and Primary Peritoneal Carcinoma, AJCC 8th Edition - Clinical stage from 12/08/2021: FIGO Stage IIIC (cT3c, cN0, cM0) - Signed by Linda Lark, MD on 12/10/2021 Stage prefix: Initial diagnosis   12/20/2021 -  Chemotherapy   Patient is on Treatment Plan : TESTICULAR BEP q21d x 3 Cycles       MEDICAL HISTORY:  Past Medical History:  Diagnosis Date   Arthritis    Endometriosis    Family history of breast cancer in mother    at age 28    GAD (generalized anxiety disorder)    lexapro helped 2017 (Dr. Hoy Finlay did have extra stress of the Master's degree program on her at that time.  Got counseling.       GERD (gastroesophageal reflux disease)    Gestational diabetes    Kidney stones    Low back pain    Dr. Charlann Boxer.   Neck pain    and left shoulder pain: managed by osteopathic manipulation by Dr. Charlann Boxer. ?cervical radiculopathy   PAC (premature atrial contraction)     SURGICAL HISTORY: Past Surgical History:  Procedure Laterality Date   ABDOMINAL EXPLORATION SURGERY  2009   fulguration of endometriosis - lsc surgery   IR IMAGING GUIDED PORT INSERTION  12/08/2021   IR US GUIDE BX ASP/DRAIN  12/08/2021   SALPINGECTOMY Right 2009   TONSILLECTOMY     TUBAL LIGATION Left 2009    SOCIAL HISTORY: Social History   Socioeconomic History   Marital status: Married    Spouse name: Not on file   Number of children: 3   Years of education: Not on file   Highest education level: Not on file  Occupational History   Occupation: nurse    Employer: [employer]    Comment: at [employer] [facility]   Tobacco Use   Smoking status: Never   Smokeless tobacco: Never  Vaping Use   Vaping Use: Never used  Substance and Sexual Activity   Alcohol use: Yes    Comment: Occasionally   Drug use: No   Sexual activity: Yes    Partners: Male    Birth control/protection: Surgical  Other Topics Concern   Not on file  Social History Narrative   Married, 3 kids.    Lives in [place].   Educ: Masters deg nursing.   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   No tob/drugs.   Alc: social.   Social Determinants of Health   Financial Resource Strain: Not on file  Food Insecurity: Not on file  Transportation Needs: Not on file  Physical Activity: Not on file  Stress: Not on file  Social Connections: Not on file  Intimate Partner Violence: Not on file     FAMILY HISTORY: Family History  Problem Relation Age of Onset   Rheum arthritis Mother    Stroke Mother    Hypertension Mother    Heart disease Mother    Arthritis Mother    Breast cancer Mother    Heart attack Mother 89   Diabetes Father    Heart disease Father    Hyperlipidemia Father    Hypertension Father    Heart attack Father 63   Heart disease Maternal Grandmother    Heart attack Maternal Grandmother 88   Arthritis Maternal Grandmother    Early death Maternal Grandfather        MVA   Diabetes Paternal Grandmother    Hypertension Paternal Grandmother    Heart disease Paternal Grandmother    Heart attack Paternal Grandmother 1   Heart disease Paternal Grandfather    Heart attack Paternal Grandfather 92   Coronary artery disease Other    Glaucoma Other    Non-Hodgkin's lymphoma Paternal Uncle     ALLERGIES:  is allergic to latex, sulfa antibiotics,  sulfamethoxazole-trimethoprim, and septra [bactrim].  MEDICATIONS:  Current Outpatient Medications  Medication Sig Dispense Refill   LORazepam (ATIVAN) 0.5 MG tablet Take 1 tablet (0.5 mg total) by mouth every 8 (eight) hours. 30 tablet 0   apixaban (ELIQUIS) 2.5 MG TABS tablet Take 1 tablet (2.5 mg total) by mouth 2 (two) times daily. Wait to start this until after you have had your procedures 60 tablet 6   dexamethasone (DECADRON) 4 MG tablet Take 2 tablets (8 mg total) by mouth daily. Start the day after last cisplatin dose on day 6 of chemo for 3 days. Take with food. 30 tablet 1   famotidine (PEPCID) 10 MG tablet Take 10 mg by mouth daily as needed for heartburn or indigestion.     lidocaine-prilocaine (EMLA) cream Apply to affected area once 30 g 3   loratadine (CLARITIN) 10 MG tablet Take 10 mg by mouth daily as needed for allergies.     ondansetron (ZOFRAN) 8 MG tablet Take 1 tablet (8 mg total) by mouth every 8 (eight) hours as needed for nausea or vomiting. Start on the third day after last cisplatin dose on  day 8 of chemo 30 tablet 1   prochlorperazine (COMPAZINE) 10 MG tablet Take 1 tablet (10 mg total) by mouth every 6 (six) hours as needed for nausea or vomiting. 30 tablet 1   No current facility-administered medications for this visit.    REVIEW OF SYSTEMS:   Constitutional: Denies fevers, chills or abnormal night sweats Eyes: Denies blurriness of vision, double vision or watery eyes Cardiovascular: Denies palpitation, chest discomfort or lower extremity swelling Skin: Denies abnormal skin rashes Lymphatics: Denies new lymphadenopathy or easy bruising Neurological:Denies numbness, tingling or new weaknesses Behavioral/Psych: Mood is stable, no new changes  All other systems were reviewed with the patient and are negative.  PHYSICAL EXAMINATION: ECOG PERFORMANCE STATUS: 1 - Symptomatic but completely ambulatory  Vitals:   12/10/21 1247  BP: 130/82  Pulse: (!) 124  Resp: 18  SpO2: 100%   Filed Weights   12/10/21 1247  Weight: 116 lb 12.8 oz (53 kg)    GENERAL:alert, no distress and comfortable SKIN: skin color, texture, turgor are normal, no rashes or significant lesions EYES: normal, conjunctiva are pink and non-injected, sclera clear OROPHARYNX:no exudate, no erythema and lips, buccal mucosa, and tongue normal  NECK: supple, thyroid normal size, non-tender, without nodularity LYMPH:  no palpable lymphadenopathy in the cervical, axillary or inguinal LUNGS: clear to auscultation and percussion with normal breathing effort HEART: regular rate & rhythm and no murmurs and no lower extremity edema ABDOMEN:abdomen soft, grossly distended with huge palpable abdominal mass Musculoskeletal:no cyanosis of digits and no clubbing  PSYCH: alert & oriented x 3 with fluent speech NEURO: no focal motor/sensory deficits  LABORATORY DATA:  I have reviewed the data as listed Lab Results  Component Value Date   WBC 16.4 (H) 12/08/2021   HGB 11.9 (L) 12/08/2021   HCT 36.3 12/08/2021    MCV 94.0 12/08/2021   PLT 584 (H) 12/08/2021   Recent Labs    12/07/21 1123  NA 137  K 4.3  CL 101  CO2 28  GLUCOSE 126*  BUN 9  CREATININE 0.67  CALCIUM 8.8*  GFRNONAA >60  PROT 6.6  ALBUMIN 3.5  AST 11*  ALT 10  ALKPHOS 50  BILITOT 0.5    RADIOGRAPHIC STUDIES: I have personally reviewed the radiological images as listed and agreed with the findings in the report. IR US Guide Bx  Asp/Drain  Result Date: 12/08/2021 INDICATION: 50 year old woman with history of pelvic malignancy presents to IR for biopsy of right upper quadrant satellite lesion. EXAM: Ultrasound-guided biopsy of right upper quadrant satellite lesion MEDICATIONS: None. ANESTHESIA/SEDATION: Moderate (conscious) sedation was employed during this procedure. A total of Versed 1 mg and Fentanyl 50 mcg was administered intravenously. Moderate Sedation Time: 10 minutes. The patient's level of consciousness and vital signs were monitored continuously by radiology nursing throughout the procedure under my direct supervision. FLUOROSCOPY TIME:  None COMPLICATIONS: None immediate. PROCEDURE: Informed written consent was obtained from the patient after a thorough discussion of the procedural risks, benefits and alternatives. All questions were addressed. Maximal Sterile Barrier Technique was utilized including caps, mask, sterile gowns, sterile gloves, sterile drape, hand hygiene and skin antiseptic. A timeout was performed prior to the initiation of the procedure. Patient position supine on the ultrasound table. Right upper quadrant skin prepped and draped in usual sterile fashion. Following local lidocaine administration, 17 gauge introducer needle was advanced into the right upper quadrant mass, and 4- 18 gauge cores were obtained utilizing continuous ultrasound guidance. Samples were sent to pathology in formalin. Needle removed and hemostasis achieved with 5 minutes of manual compression. Post procedure ultrasound images showed no  evidence of significant hemorrhage. IMPRESSION: Ultrasound-guided biopsy of right upper quadrant mass. Electronically Signed   By: Miachel Roux M.D.   On: 12/08/2021 12:37   IR IMAGING GUIDED PORT INSERTION  Result Date: 12/08/2021 INDICATION: Metastatic pelvic malignancy EXAM: IMPLANTED PORT A CATH PLACEMENT WITH ULTRASOUND AND FLUOROSCOPIC GUIDANCE MEDICATIONS: None ANESTHESIA/SEDATION: Moderate (conscious) sedation was employed during this procedure. A total of Versed 2 mg and Fentanyl 150 mcg was administered intravenously by the radiology nurse. Total intra-service moderate Sedation Time: 21 minutes. The patient's level of consciousness and vital signs were monitored continuously by radiology nursing throughout the procedure under my direct supervision. FLUOROSCOPY: Radiation Exposure Index (as provided by the fluoroscopic device): 7 mGy K[place]a COMPLICATIONS: None immediate. PROCEDURE: The procedure, risks, benefits, and alternatives were explained to the patient. Questions regarding the procedure were encouraged and answered. The patient understands and consents to the procedure. A timeout was performed prior to the initiation of the procedure. Patient positioned supine on the angiography table. Right neck and anterior upper chest prepped and draped in the usual sterile fashion. All elements of maximal sterile barrier were utilized including, cap, mask, sterile gown, sterile gloves, large sterile drape, hand scrubbing and 2% Chlorhexidine for skin cleaning. The right internal jugular vein was evaluated with ultrasound and shown to be patent. A permanent ultrasound image was obtained and placed in the patient's medical record. Local anesthesia was provided with 1% lidocaine with epinephrine. Using sterile gel and a sterile probe cover, the right internal jugular vein was entered with a 21 ga needle during real time ultrasound guidance. 0.018 inch guidewire placed and 21 ga needle exchanged for transitional  dilator set. Utilizing fluoroscopy, 0.035 inch guidewire advanced centrally without difficulty. Attention then turned to the right anterior upper chest. Following local lidocaine administration, a port pocket was created. The catheter was connected to the port and brought from the pocket to the venotomy site through a subcutaneous tunnel. The catheter was cut to size and inserted through the peel-away sheath. The catheter tip was positioned at the cavoatrial junction using fluoroscopic guidance. The port aspirated and flushed well. The port pocket was closed with deep and superficial absorbable suture. The port pocket incision and venotomy sites were also sealed with Dermabond.  IMPRESSION: Successful placement of a right internal jugular approach power injectable Port-A-Cath. The catheter is ready for immediate use. Electronically Signed   By: Miachel Roux M.D.   On: 12/08/2021 12:36   VAS [place] LOWER EXTREMITY VENOUS (DVT)  Result Date: 12/07/2021  Lower Venous DVT Study Patient Name:  AKARI DEFELICE  Date of Exam:   12/07/2021 Medical Rec #: [id number]       Accession #:    [id number] Date of Birth: Jul 04, 1970       Patient Gender: F Patient Age:   51 years Exam Location:  [facility] Procedure:      VAS [place] LOWER EXTREMITY VENOUS (DVT) Referring Phys: Jeral Pinch --------------------------------------------------------------------------------  Indications: Abdominopelvic mass with compression of IVC.  Comparison Study: No prior studies. Performing Technologist: Darlin Coco RDMS, RVT  Examination Guidelines: A complete evaluation includes B-mode imaging, spectral Doppler, color Doppler, and power Doppler as needed of all accessible portions of each vessel. Bilateral testing is considered an integral part of a complete examination. Limited examinations for reoccurring indications may be performed as noted. The reflux portion of the exam is performed with the patient in reverse Trendelenburg.   +---------+---------------+---------+-----------+----------+--------------+ RIGHT    CompressibilityPhasicitySpontaneityPropertiesThrombus Aging +---------+---------------+---------+-----------+----------+--------------+ CFV      Full           Yes      Yes                                 +---------+---------------+---------+-----------+----------+--------------+ SFJ      Full                                                        +---------+---------------+---------+-----------+----------+--------------+ FV Prox  Full                                                        +---------+---------------+---------+-----------+----------+--------------+ FV Mid   Full                                                        +---------+---------------+---------+-----------+----------+--------------+ FV DistalFull                                                        +---------+---------------+---------+-----------+----------+--------------+ PFV      Full                                                        +---------+---------------+---------+-----------+----------+--------------+ POP      Full           Yes      Yes                                 +---------+---------------+---------+-----------+----------+--------------+  PTV      Full                                                        +---------+---------------+---------+-----------+----------+--------------+ PERO     Full                                                        +---------+---------------+---------+-----------+----------+--------------+ Gastroc  Full                                                        +---------+---------------+---------+-----------+----------+--------------+   +---------+---------------+---------+-----------+----------+--------------+ LEFT     CompressibilityPhasicitySpontaneityPropertiesThrombus Aging  +---------+---------------+---------+-----------+----------+--------------+ CFV      Full           Yes      Yes                                 +---------+---------------+---------+-----------+----------+--------------+ SFJ      Full                                                        +---------+---------------+---------+-----------+----------+--------------+ FV Prox  Full                                                        +---------+---------------+---------+-----------+----------+--------------+ FV Mid   Full                                                        +---------+---------------+---------+-----------+----------+--------------+ FV DistalFull                                                        +---------+---------------+---------+-----------+----------+--------------+ PFV      Full                                                        +---------+---------------+---------+-----------+----------+--------------+ POP      Full           Yes      Yes                                 +---------+---------------+---------+-----------+----------+--------------+  PTV      Full                                                        +---------+---------------+---------+-----------+----------+--------------+ PERO     Full                                                        +---------+---------------+---------+-----------+----------+--------------+ Gastroc  Full                                                        +---------+---------------+---------+-----------+----------+--------------+     Summary: RIGHT: - There is no evidence of deep vein thrombosis in the lower extremity.  - No cystic structure found in the popliteal fossa.  LEFT: - There is no evidence of deep vein thrombosis in the lower extremity.  - No cystic structure found in the popliteal fossa.  *See table(s) above for measurements and observations. Electronically signed  by Deitra Mayo MD on 12/07/2021 at 12:56:56 PM.    Final    CT ABDOMEN W CONTRAST  Result Date: 12/07/2021 CLINICAL DATA:  Pelvic mass. Malignant ascites. Metastatic disease evaluation. * Tracking Code: BO * EXAM: CT ABDOMEN WITH CONTRAST TECHNIQUE: Multidetector CT imaging of the abdomen was performed using the standard protocol following bolus administration of intravenous contrast. RADIATION DOSE REDUCTION: This exam was performed according to the departmental dose-optimization program which includes automated exposure control, adjustment of the mA and/or kV according to patient size and/or use of iterative reconstruction technique. CONTRAST:  68m OMNIPAQUE IOHEXOL 350 MG/ML SOLN COMPARISON:  Pelvic MRI 12/02/2021.  Abdominopelvic CT 09/29/2005. FINDINGS: Lower chest:  Chest findings dictated separately. Hepatobiliary: There is a large heterogeneous, partially calcified mass along the superior aspect of the right hepatic lobe which is likely subcapsular in origin based on the reformatted images. This measures approximately 10.3 x 8.0 x 5.1 cm and shows heterogeneous enhancement following contrast. There may be invasion of the liver, especially by a 1.8 cm component on coronal image 45/5. No other separate hepatic lesions. No evidence of gallstones, gallbladder wall thickening or biliary dilatation. Pancreas: Unremarkable. No pancreatic ductal dilatation or surrounding inflammatory changes. Spleen: Normal in size without focal abnormality. Adrenals/Urinary Tract: Both adrenal glands appear normal. No evidence of urinary tract calculus, suspicious renal lesion or hydronephrosis Bladder not imaged. Stomach/Bowel: No enteric contrast administered. The stomach appears unremarkable for its degree of distention. The visualized bowel demonstrates no wall thickening, distention or surrounding inflammation. The appendix appears normal. Vascular/Lymphatic: There are no enlarged abdominal lymph nodes. Aortic  and branch vessel atherosclerosis without evidence of large vessel occlusion or aneurysm. The portal, superior mesenteric and splenic veins appear patent. Other: Small amount of abdominal ascites. As above, there is a large subcapsular metastasis involving the superior aspect of the liver. There is a right omental implant measuring 6.9 x 3.5 cm on image 28/2. There is a very large central mass involving the upper abdomen and pelvis, incompletely visualized by this CT of the  abdomen, but measuring up to 16.5 x 12.2 cm on image 37/2. This was seen on recent pelvic MRI. Musculoskeletal: No acute or significant osseous findings. IMPRESSION: 1. Large central abdominal and pelvic mass consistent with known malignancy. This is incompletely visualized by this CT of the abdomen, but was seen on recent pelvic MRI. 2. 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 Right omental implant consistent with metastatic disease. Small amount of abdominal ascites. 3. No evidence for bowel or ureteral obstruction. 4. Aortic Atherosclerosis (ICD10-I70.0). 5. Chest findings dictated separately. Electronically Signed   By: Richardean Sale M.D.   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  Cough. EXAM: CT ANGIOGRAPHY CHEST WITH CONTRAST TECHNIQUE: Multidetector CT imaging of the chest was performed using the standard protocol during bolus administration of intravenous contrast. Multiplanar CT image reconstructions and MIPs were obtained to evaluate the vascular anatomy. RADIATION DOSE REDUCTION: This exam was performed according to the departmental dose-optimization program which includes automated exposure control, adjustment of the mA and/or kV according to patient size and/or use of iterative reconstruction technique. CONTRAST:  27m OMNIPAQUE  IOHEXOL 350 MG/ML SOLN COMPARISON:  CT Abdomen today reported separately. FINDINGS: Cardiovascular: Excellent contrast bolus timing in the pulmonary arterial tree. Mild respiratory motion. No pulmonary artery filling defect. No calcified coronary artery atherosclerosis is evident. Negative visible aorta. No cardiomegaly or pericardial effusion. Mediastinum/Nodes: No mediastinal mass or lymphadenopathy. Lungs/Pleura: Major airways are patent. Both lungs are clear except for minor dependent atelectasis. No pulmonary nodule, mass, pleural effusion, consolidation. Upper Abdomen: Reported separately today, liver mass and ascites are visible. Musculoskeletal: No acute or suspicious osseous lesion in the chest. Review of the MIP images confirms the above findings. IMPRESSION: 1. Negative for pulmonary embolism. And no acute or metastatic process identified in the Chest. 2. Partially visible liver mass and ascites, staging CT Abdomen today is reported separately. Electronically Signed   By: HGenevie AnnM.D.   On: 12/07/2021 10:54   MR PELVIS W WO CONTRAST  Result Date: 12/02/2021 CLINICAL DATA:  Pelvic mass for 3 months. EXAM: MRI PELVIS WITHOUT AND WITH CONTRAST TECHNIQUE: Multiplanar multisequence MR imaging of the pelvis was performed both before and after administration of intravenous contrast. CONTRAST:  5 cc Vueway COMPARISON:  None Available. FINDINGS: Urinary Tract:  Unremarkable where included Bowel: Bowel is displaced by the large abdominopelvic tumor. Otherwise unremarkable. Vascular/Lymphatic: Flattening of the IVC at the level of the aortic bifurcation for example on image 34 series 12 due to the abdominopelvic mass. No findings of IVC thrombus or pelvic DVT. Reproductive: Multilobulated and heterogeneously enhancing 21.1 by 10.9 by 18.7 cm (volume = 2250 cm^3) mass is present with composed opponents abutting both adnexa. A satellite mass in the right upper quadrant anteriorly measures 6.9 by 4.1 by 6.4 cm  (volume = 95 cm^3) on image 15 of series 4. There is moderate ascites with tumor deposits along the pelvic ascites posteriorly for example on images 79 through 87 of series 16. The mass primarily extends above the uterus, and could be arising from the right ovary, left ovary, or both. There is abnormal enhancement along ascites margins in the paracolic gutter most compatible with malignant ascites. Today's exam was not meant to include the upper abdomen as this is an MRI of the pelvis. However, coronal images include enough  of upper abdomen to indicate that there is likely a at least 7 cm tumor deposit just above the liver (image 14, series 2). Other:  No supplemental non-categorized findings. Musculoskeletal: Low-level edema along the lateral abdominal wall musculature for example on image 6 series 4, otherwise unremarkable. IMPRESSION: 1. 21.1 by 10.9 by 18.7 cm abdominopelvic mass, with a satellite mass in the right upper quadrant anteriorly, and moderate ascites with tumor deposits along the pelvic ascites posteriorly. The mass could be arising from right or left ovary, or both. The mass displaces the bowel and IVC, but there is no IVC thrombus or pelvic DVT identified. 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 2. Additional satellite mass above the liver is partially included, along with tumor deposits along the ascites. 3. Low-level edema along the lateral abdominal wall musculature. 4. Flattening of the IVC at the level of the aortic bifurcation due to the large abdominopelvic tumor. No findings of pelvic DVT. Electronically Signed   By: Van Clines M.D.   On: 12/02/2021 16:31

## 2021-12-10 NOTE — Progress Notes (Signed)
DISCONTINUE ON PATHWAY REGIMEN - Ovarian  No Medical Intervention - Off Treatment.  REASON: Other Reason PRIOR TREATMENT: Off Treatment  START OFF PATHWAY REGIMEN - Ovarian   [id number]:Bleomycin 30 units D1, 8, 15 + Etoposide 100 mg/m2 D1-5 + Cisplatin 20 mg/m2 D1-5 (BEP) q21 Days:   A cycle is every 21 days:     Bleomycin      Etoposide      Cisplatin   **Always confirm dose/schedule in your pharmacy ordering system**  Patient Characteristics: Preoperative or Nonsurgical Candidate (Clinical Staging), Newly Diagnosed, Neoadjuvant Therapy followed by Surgery BRCA Mutation Status: Awaiting Test Results Therapeutic Status: Preoperative or Nonsurgical Candidate (Clinical Staging) AJCC T Category: cTX AJCC 8 Stage Grouping: IIIC AJCC N Category: cNX AJCC M Category: cM0 Therapy Plan: Neoadjuvant Therapy followed by Surgery Intent of Therapy: Curative Intent, Discussed with Patient

## 2021-12-10 NOTE — Assessment & Plan Note (Addendum)
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 and beta HCG will support this diagnosis My original plan before I saw her would be to prescribe neoadjuvant chemotherapy with carboplatin and paclitaxel However, in view of recent change in diagnosis, I recommend we switch her treatment to combination of bleomycin, cisplatin and etoposide The risk, benefits, side effects of treatment were fully discussed with the patient and family and they are in agreement to proceed She will get blood work and chemo education class next week and then I will see her prior to cycle 1 of treatment on November 6 I recommend minimum 2 cycles of treatment before repeating imaging study We discussed the use of pulmonary function test to establish baseline prior to bleomycin treatment Due to poor prognosis feature, she would likely need 4 cycles of BEP  I will order tumor markers starting cycle 2 of therapy Due to the aggressive nature of her disease and high risk of bowel obstruction, I plan to see her every week for toxicity review We discussed the goal of treatment is of curative intent

## 2021-12-10 NOTE — Telephone Encounter (Signed)
Called and given appt for pulmonary function test at [facility] on 11/2 at 1pm, she is aware of address. She verbazlied understanding.

## 2021-12-10 NOTE — Assessment & Plan Note (Signed)
She is at high risk of thrombotic event I agree with the role of prophylactic anticoagulation therapy to avoid risk of blood clots

## 2021-12-10 NOTE — Progress Notes (Signed)
DISCONTINUE ON PATHWAY REGIMEN - Ovarian     A cycle is every 21 days:     Paclitaxel      Carboplatin   **Always confirm dose/schedule in your pharmacy ordering system**  REASON: Other Reason PRIOR TREATMENT: [id number]: Carboplatin AUC=6 + Paclitaxel 175 mg/m2 q21 Days x 2-4 Cycles TREATMENT RESPONSE: Unable to Evaluate  Ovarian - No Medical Intervention - Off Treatment.  Patient Characteristics: BRCA Mutation Status: Awaiting Test Results Therapeutic Status: Preoperative or Nonsurgical Candidate (Clinical Staging)

## 2021-12-10 NOTE — Telephone Encounter (Signed)
Spoke with the patient, reviewed prelim biopsy results (immature teratoma). Discussed again decision about primary surgery versus NACT. Ultimately, give large liver lesion, I recommend starting with NACT (worry about the growth of the liver lesion with delay of starting chemotherapy with surgery and recovery). Reviewed literature about NACT in germ cell tumors - very rare tumor, but there are some series describing NACT use (either BEP or CT) in advanced germ cell tumors with relatively good response. Patient amenable with plan for NACT.  Jeral Pinch MD Gynecologic Oncology

## 2021-12-11 ENCOUNTER — Other Ambulatory Visit: Payer: Self-pay

## 2021-12-13 ENCOUNTER — Other Ambulatory Visit: Payer: Self-pay | Admitting: Hematology and Oncology

## 2021-12-13 NOTE — Progress Notes (Signed)
Pharmacist Chemotherapy Monitoring - Initial Assessment    Anticipated start date: 12/20/21   The following has been reviewed per standard work regarding the patient's treatment regimen: The patient's diagnosis, treatment plan and drug doses, and organ/hematologic function Lab orders and baseline tests specific to treatment regimen  The treatment plan start date, drug sequencing, and pre-medications Prior authorization status  Patient's documented medication list, including drug-drug interaction screen and prescriptions for anti-emetics and supportive care specific to the treatment regimen The drug concentrations, fluid compatibility, administration routes, and timing of the medications to be used The patient's access for treatment and lifetime cumulative dose history, if applicable  The patient's medication allergies and previous infusion related reactions, if applicable   Changes made to treatment plan:  N/A  Follow up needed:  Pending authorization for treatment    Kennith Center, Pharm.D., CPP 12/13/2021'@2'$ :39 PM

## 2021-12-14 ENCOUNTER — Telehealth: Payer: Self-pay

## 2021-12-14 NOTE — Telephone Encounter (Signed)
Returned her call. She is looking for suggestions on how to eat more. She feels full and is having a hard time eating. She was able to eat more this morning then yesterday. Denies constipation and having bm. Instructed to eat constantly, at least every 2 hours thru out the day. If she is getting full from drinking she will call the office back for IV fluids and just concentrate on eating. She is agreeable and appreciated the call.

## 2021-12-15 ENCOUNTER — Inpatient Hospital Stay: Payer: 59 | Attending: Gynecologic Oncology | Admitting: Licensed Clinical Social Worker

## 2021-12-15 ENCOUNTER — Ambulatory Visit (HOSPITAL_COMMUNITY): Admission: RE | Admit: 2021-12-15 | Payer: 59 | Source: Ambulatory Visit

## 2021-12-15 ENCOUNTER — Telehealth: Payer: Self-pay | Admitting: *Deleted

## 2021-12-15 ENCOUNTER — Ambulatory Visit (HOSPITAL_COMMUNITY): Payer: 59

## 2021-12-15 DIAGNOSIS — C569 Malignant neoplasm of unspecified ovary: Secondary | ICD-10-CM | POA: Insufficient documentation

## 2021-12-15 DIAGNOSIS — Z5111 Encounter for antineoplastic chemotherapy: Secondary | ICD-10-CM | POA: Insufficient documentation

## 2021-12-15 DIAGNOSIS — D61818 Other pancytopenia: Secondary | ICD-10-CM | POA: Insufficient documentation

## 2021-12-15 DIAGNOSIS — Z79899 Other long term (current) drug therapy: Secondary | ICD-10-CM | POA: Insufficient documentation

## 2021-12-15 LAB — SURGICAL PATHOLOGY

## 2021-12-15 NOTE — Progress Notes (Signed)
[place] Work  Initial Assessment   [name] is a 51 y.o. year old female contacted by phone. Clinical Social Work was referred by new patient protocol for assessment of psychosocial needs.   SDOH (Social Determinants of Health) assessments performed: Yes SDOH Interventions    Flowsheet Row Clinical Support from 12/15/2021 in [facility]  SDOH Interventions   Food Insecurity Interventions Intervention Not Indicated  Housing Interventions Intervention Not Indicated  Transportation Interventions Intervention Not Indicated       SDOH Screenings   Food Insecurity: No Food Insecurity (12/15/2021)  Housing: Low Risk  (12/15/2021)  Transportation Needs: No Transportation Needs (12/15/2021)  Depression (PHQ2-9): Low Risk  (02/19/2018)  Tobacco Use: Low Risk  (12/10/2021)     Distress Screen completed: No     No data to display            Family/Social Information:  Housing Arrangement: patient lives with husband, 2 teenage daughters (98 & 52) . 39yo son at [employer] members/support persons in your life? 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  Income source: Employment Financial concerns: No Type of concern: None Food access concerns: no Religious or spiritual practice: Not known Services Currently in place:  setting up FMLA  Coping/ Adjustment to diagnosis: Patient understands treatment plan and what happens next? yes, is ready to get started with chemo and finding it hard to feel like she is not doing anything Concerns about diagnosis and/or treatment:  children's wellbeing, delays before starting Patient reported stressors: Children, Anxiety/ nervousness, and Adjusting to my illness Current coping skills/ strengths: Ability for insight , Capable of independent living , Communication skills , Supportive family/friends , and Work skills      SUMMARY: Current SDOH Barriers:  No significant barriers noted today  Clinical Social Work Clinical Goal(s):  Continue to utilize support system to assist with logistics and emotional support  Interventions: Discussed common feeling and emotions when being diagnosed with cancer, and the importance of support during treatment Informed patient of the support team roles and support services at [facility] Provided CSW contact information and encouraged patient to call with any questions or concerns Will provide information on counselors for children locally   Follow Up Plan: CSW will see patient on 11/6 during 1st infusion Patient verbalizes understanding of plan: Yes    Trapper Meech E Johnnye Sandford, LCSW

## 2021-12-15 NOTE — Telephone Encounter (Signed)
Connected with [employer] [phone]) to request updated disability form.  Today received copies addressed and faxed to GYN provider without signature of the [employer] release.  Provided legal name "[name]", Main office phone: [phone], Fax: [phone], E-mail [email or web address] as requested.  Representative reports [employer] has no copy of signed release at this time but will contact patient.  Awaiting new form. Connected with [name], [phone]) to request signature of [place] tomorrow.  "On continuous leave.  Have not worked since December 01, 2021.  Thank you for helping me with this."   Forms to education nurse for patient signature tomorrow.

## 2021-12-16 ENCOUNTER — Other Ambulatory Visit: Payer: Self-pay

## 2021-12-16 ENCOUNTER — Inpatient Hospital Stay: Payer: 59

## 2021-12-16 ENCOUNTER — Ambulatory Visit (HOSPITAL_COMMUNITY)
Admission: RE | Admit: 2021-12-16 | Discharge: 2021-12-16 | Disposition: A | Discharge status: Home / Self Care | Payer: 59 | Source: Ambulatory Visit | Attending: Hematology and Oncology | Admitting: Hematology and Oncology

## 2021-12-16 DIAGNOSIS — C569 Malignant neoplasm of unspecified ovary: Secondary | ICD-10-CM | POA: Insufficient documentation

## 2021-12-16 DIAGNOSIS — Z5111 Encounter for antineoplastic chemotherapy: Secondary | ICD-10-CM | POA: Diagnosis present

## 2021-12-16 DIAGNOSIS — D61818 Other pancytopenia: Secondary | ICD-10-CM | POA: Diagnosis not present

## 2021-12-16 DIAGNOSIS — Z79899 Other long term (current) drug therapy: Secondary | ICD-10-CM | POA: Diagnosis not present

## 2021-12-16 LAB — CBC WITH DIFFERENTIAL (CANCER CENTER ONLY)
Abs Immature Granulocytes: 0.28 10*3/uL — ABNORMAL HIGH (ref 0.00–0.07)
Basophils Absolute: 0.1 10*3/uL (ref 0.0–0.1)
Basophils Relative: 1 %
Eosinophils Absolute: 0.1 10*3/uL (ref 0.0–0.5)
Eosinophils Relative: 0 %
HCT: 31.2 % — ABNORMAL LOW (ref 36.0–46.0)
Hemoglobin: 10.3 g/dL — ABNORMAL LOW (ref 12.0–15.0)
Immature Granulocytes: 2 %
Lymphocytes Relative: 8 %
Lymphs Abs: 1.3 10*3/uL (ref 0.7–4.0)
MCH: 30.1 pg (ref 26.0–34.0)
MCHC: 33 g/dL (ref 30.0–36.0)
MCV: 91.2 fL (ref 80.0–100.0)
Monocytes Absolute: 1.1 10*3/uL — ABNORMAL HIGH (ref 0.1–1.0)
Monocytes Relative: 7 %
Neutro Abs: 12.3 10*3/uL — ABNORMAL HIGH (ref 1.7–7.7)
Neutrophils Relative %: 82 %
Platelet Count: 568 10*3/uL — ABNORMAL HIGH (ref 150–400)
RBC: 3.42 MIL/uL — ABNORMAL LOW (ref 3.87–5.11)
RDW: 13.2 % (ref 11.5–15.5)
WBC Count: 15 10*3/uL — ABNORMAL HIGH (ref 4.0–10.5)
nRBC: 0 % (ref 0.0–0.2)

## 2021-12-16 LAB — CMP (CANCER CENTER ONLY)
ALT: 13 U/L (ref 0–44)
AST: 11 U/L — ABNORMAL LOW (ref 15–41)
Albumin: 3.2 g/dL — ABNORMAL LOW (ref 3.5–5.0)
Alkaline Phosphatase: 48 U/L (ref 38–126)
Anion gap: 6 (ref 5–15)
BUN: 9 mg/dL (ref 6–20)
CO2: 27 mmol/L (ref 22–32)
Calcium: 8.5 mg/dL — ABNORMAL LOW (ref 8.9–10.3)
Chloride: 105 mmol/L (ref 98–111)
Creatinine: 0.58 mg/dL (ref 0.44–1.00)
GFR, Estimated: >60 mL/min (ref >60)
Glucose, Bld: 98 mg/dL (ref 70–99)
Potassium: 3.9 mmol/L (ref 3.5–5.1)
Sodium: 138 mmol/L (ref 135–145)
Total Bilirubin: 0.4 mg/dL (ref 0.3–1.2)
Total Protein: 6.2 g/dL — ABNORMAL LOW (ref 6.5–8.1)

## 2021-12-16 LAB — PULMONARY FUNCTION TEST
DL/VA % pred: 107 %
DL/VA: 4.81 ml/min/mmHg/L
DLCO cor % pred: 109 %
DLCO cor: 19.29 ml/min/mmHg
DLCO unc % pred: 97 %
DLCO unc: 17.16 ml/min/mmHg
FEF 25-75 Pre: 2.15 L/sec
FEF2575-%Pred-Pre: 86 %
FEV1-%Pred-Pre: 99 %
FEV1-Pre: 2.33 L
FEV1FVC-%Pred-Pre: 95 %
FEV6-%Pred-Pre: 104 %
FEV6-Pre: 2.99 L
FEV6FVC-%Pred-Pre: 102 %
FVC-%Pred-Pre: 103 %
FVC-Pre: 3.05 L
Pre FEV1/FVC ratio: 77 %
Pre FEV6/FVC Ratio: 100 %
RV % pred: 42 %
RV: 0.65 L
TLC % pred: 87 %
TLC: 3.76 L

## 2021-12-16 LAB — MAGNESIUM: Magnesium: 2.1 mg/dL (ref 1.7–2.4)

## 2021-12-16 MED ORDER — SODIUM CHLORIDE 0.9% FLUSH
10.0000 mL | Freq: Once | INTRAVENOUS | Status: AC
  Administered 2021-12-16: 10 mL

## 2021-12-16 MED ORDER — HEPARIN SOD (PORK) LOCK FLUSH 100 UNIT/ML IV SOLN
500.0000 [IU] | Freq: Once | INTRAVENOUS | Status: AC
  Administered 2021-12-16: 500 [IU]

## 2021-12-17 ENCOUNTER — Encounter: Payer: Self-pay | Admitting: Hematology and Oncology

## 2021-12-17 ENCOUNTER — Other Ambulatory Visit: Payer: Self-pay

## 2021-12-17 MED FILL — Dexamethasone Sodium Phosphate Inj 100 MG/10ML: INTRAMUSCULAR | Qty: 1 | Status: AC

## 2021-12-17 MED FILL — Fosaprepitant Dimeglumine For IV Infusion 150 MG (Base Eq): INTRAVENOUS | Qty: 5 | Status: AC

## 2021-12-17 NOTE — Progress Notes (Signed)
Called pt to introduce myself as her Arboriculturist and to discuss the [employer].  I went over what it covers and gave her the income requirement but she stated she's over the limit so she doesn't qualify for the grant at this time.  I will request the registration staff give her my card in case anything changes with her income and for any questions or concerns she may have in the future.

## 2021-12-20 ENCOUNTER — Inpatient Hospital Stay: Payer: 59 | Admitting: Licensed Clinical Social Worker

## 2021-12-20 ENCOUNTER — Other Ambulatory Visit: Payer: Self-pay

## 2021-12-20 ENCOUNTER — Encounter: Payer: Self-pay | Admitting: Hematology and Oncology

## 2021-12-20 ENCOUNTER — Inpatient Hospital Stay (HOSPITAL_BASED_OUTPATIENT_CLINIC_OR_DEPARTMENT_OTHER): Payer: 59 | Admitting: Hematology and Oncology

## 2021-12-20 ENCOUNTER — Inpatient Hospital Stay: Payer: 59

## 2021-12-20 VITALS — HR 111

## 2021-12-20 DIAGNOSIS — C569 Malignant neoplasm of unspecified ovary: Secondary | ICD-10-CM

## 2021-12-20 DIAGNOSIS — Z5111 Encounter for antineoplastic chemotherapy: Secondary | ICD-10-CM | POA: Diagnosis not present

## 2021-12-20 MED ORDER — SODIUM CHLORIDE 0.9 % IV SOLN
10.0000 mg | Freq: Once | INTRAVENOUS | Status: AC
  Administered 2021-12-20: 10 mg via INTRAVENOUS
  Filled 2021-12-20: qty 10

## 2021-12-20 MED ORDER — SODIUM CHLORIDE 0.9 % IV SOLN: Freq: Once | INTRAVENOUS | Status: AC

## 2021-12-20 MED ORDER — SODIUM CHLORIDE 0.9% FLUSH
10.0000 mL | INTRAVENOUS | Status: DC | PRN
  Administered 2021-12-20: 10 mL

## 2021-12-20 MED ORDER — SODIUM CHLORIDE 0.9 % IV SOLN
150.0000 mg | Freq: Once | INTRAVENOUS | Status: AC
  Administered 2021-12-20: 150 mg via INTRAVENOUS
  Filled 2021-12-20: qty 150

## 2021-12-20 MED ORDER — HEPARIN SOD (PORK) LOCK FLUSH 100 UNIT/ML IV SOLN
500.0000 [IU] | Freq: Once | INTRAVENOUS | Status: AC | PRN
  Administered 2021-12-20: 500 [IU]

## 2021-12-20 MED ORDER — PALONOSETRON HCL INJECTION 0.25 MG/5ML
0.2500 mg | Freq: Once | INTRAVENOUS | Status: AC
  Administered 2021-12-20: 0.25 mg via INTRAVENOUS
  Filled 2021-12-20: qty 5

## 2021-12-20 MED ORDER — SODIUM CHLORIDE 0.9 % IV SOLN
100.0000 mg/m2 | Freq: Once | INTRAVENOUS | Status: AC
  Administered 2021-12-20: 150 mg via INTRAVENOUS
  Filled 2021-12-20: qty 7.5

## 2021-12-20 MED ORDER — SODIUM CHLORIDE 0.9 % IV SOLN
20.0000 mg/m2 | Freq: Once | INTRAVENOUS | Status: AC
  Administered 2021-12-20: 30 mg via INTRAVENOUS
  Filled 2021-12-20: qty 30

## 2021-12-20 MED ORDER — MAGNESIUM SULFATE 2 GM/50ML IV SOLN
2.0000 g | Freq: Once | INTRAVENOUS | Status: AC
  Administered 2021-12-20: 2 g via INTRAVENOUS
  Filled 2021-12-20: qty 50

## 2021-12-20 MED ORDER — POTASSIUM CHLORIDE IN NACL 20-0.9 MEQ/L-% IV SOLN
Freq: Once | INTRAVENOUS | Status: AC
  Filled 2021-12-20: qty 1000

## 2021-12-20 MED FILL — Dexamethasone Sodium Phosphate Inj 100 MG/10ML: INTRAMUSCULAR | Qty: 1 | Status: AC

## 2021-12-20 NOTE — Progress Notes (Signed)
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   All questions were answered. The patient knows to call the clinic with any problems, questions or concerns. The total time spent in the appointment was 25 minutes encounter with patients including review of chart and various tests results, discussions about plan of care and coordination of care plan   Heath Lark, MD 12/20/2021 8:10 AM  INTERVAL HISTORY: Please see below for problem oriented charting. she returns to be seen prior to treatment She is here accompanied by family members She noted progressive abdominal distention She denies nausea, vomiting or difficulties with bowel habits  REVIEW OF SYSTEMS:   Constitutional: Denies fevers, chills or abnormal weight loss Eyes: Denies blurriness of vision Ears, nose, mouth, throat, and face: Denies mucositis or sore throat Respiratory: Denies cough, dyspnea or wheezes Cardiovascular: Denies palpitation, chest discomfort or lower extremity swelling Gastrointestinal:  Denies nausea, heartburn or change in bowel habits Skin: Denies abnormal skin rashes Lymphatics: Denies new lymphadenopathy or easy bruising Neurological:Denies numbness, tingling  or new weaknesses Behavioral/Psych: Mood is stable, no new changes  All other systems were reviewed with the patient and are negative.  I have reviewed the past medical history, past surgical history, social history and family history with the patient and they are unchanged from previous note.  ALLERGIES:  is allergic to latex, sulfa antibiotics, sulfamethoxazole-trimethoprim, and septra [bactrim].  MEDICATIONS:  Current Outpatient Medications  Medication Sig Dispense Refill   apixaban (ELIQUIS) 2.5 MG TABS tablet Take 1 tablet (2.5 mg total) by mouth 2 (two) times daily. Wait to start this until after you have had your procedures 60 tablet 6   dexamethasone (DECADRON) 4 MG tablet Take 2 tablets (8 mg total) by mouth daily. Start the day after last cisplatin dose on day 6 of chemo for 3 days. Take with food. 30 tablet 1   famotidine (PEPCID) 10 MG tablet Take 10 mg by mouth daily as needed for heartburn or indigestion.     lidocaine-prilocaine (EMLA) cream Apply to affected area once 30 g 3   loratadine (CLARITIN) 10 MG tablet Take 10 mg by mouth daily as needed for allergies.     LORazepam (ATIVAN) 0.5 MG tablet Take 1 tablet (0.5 mg total) by mouth every 8 (eight) hours. 30 tablet 0   ondansetron (ZOFRAN) 8 MG tablet Take 1 tablet (8 mg total) by mouth every 8 (eight) hours as needed for nausea or vomiting. Start on the third day after last cisplatin dose on day 8 of chemo 30 tablet 1   prochlorperazine (COMPAZINE) 10 MG tablet Take 1 tablet (10 mg total) by mouth every 6 (six) hours as needed for nausea or vomiting. 30 tablet 1   No current facility-administered medications for this  visit.    SUMMARY OF ONCOLOGIC HISTORY: Oncology History  Malignant germ cell tumor of ovary ([place])  12/01/2021 Tumor Marker   CA-125 is elevated at 285, [place]-fet[place] [address], beta-hCG is elevated at 116   12/02/2021 Imaging   MRI pelvis  1. 21.1 by 10.9 by 18.7 cm abdominopelvic mass, with  a satellite mass in the right upper quadrant anteriorly, and moderate ascites with tumor deposits along the pelvic ascites posteriorly. The mass could be arising from right or left ovary, or both. The mass displaces the bowel and IVC, but there is no IVC thrombus or pelvic DVT identified. 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 2. Additional satellite mass above the liver is partially included, along with tumor deposits along the ascites. 3. Low-level edema along the lateral abdominal wall musculature. 4. Flattening of the IVC at the level of the aortic bifurcation due to the large abdominopelvic tumor. No findings of pelvic DVT.     12/07/2021 Imaging   CT abdomen and pelvis 1. Large central abdominal and pelvic mass consistent with known malignancy. This is incompletely visualized by this CT of the abdomen, but was seen on recent pelvic MRI. 2. 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 Right omental implant consistent with metastatic disease. Small amount of abdominal ascites. 3. No evidence for bowel or ureteral obstruction. 4. Aortic Atherosclerosis (ICD10-I70.0). 5. Chest findings dictated separately.   12/07/2021 Imaging   CT chest 1. Negative for pulmonary embolism. And no acute or metastatic process identified in the Chest. 2. Partially visible liver mass and ascites, staging CT Abdomen today is reported separately.     12/07/2021 Procedure   Successful placement of a right internal jugular approach power injectable Port-A-Cath. The catheter is ready for immediate use.   12/07/2021 Procedure   Ultrasound-guided biopsy of right upper quadrant mass.    12/08/2021 Initial Diagnosis   Ovarian cancer ([place])   12/08/2021 Cancer Staging   Staging form: Ovary, Fallopian Tube, and Primary Peritoneal Carcinoma, AJCC 8th Edition - Clinical stage from 12/08/2021: FIGO  Stage IIIC (cT3c, cN0, cM0) - Signed by Heath Lark, MD on 12/10/2021 Stage prefix: Initial diagnosis   12/20/2021 -  Chemotherapy   Patient is on Treatment Plan : Ovarian germ cell tumor q21d x 3 Cycles       PHYSICAL EXAMINATION: ECOG PERFORMANCE STATUS: 1 - Symptomatic but completely ambulatory  Vitals:   12/20/21 0756 12/20/21 0757  BP: (!) 131/105 (!) 141/87  Pulse: (!) 130   Resp: 18   Temp: (!) 97.3 F (36.3 C)   SpO2: 100%    Filed Weights   12/20/21 0756  Weight: 121 lb 14.4 oz (55.3 kg)    GENERAL:alert, no distress and comfortable ABDOMEN:abdomen is grossly distended Musculoskeletal:no cyanosis of digits and no clubbing  NEURO: alert & oriented x 3 with fluent speech, no focal motor/sensory deficits  LABORATORY DATA:  I have reviewed the data as listed    Component Value Date/Time   NA 138 12/16/2021 1147   K 3.9 12/16/2021 1147   CL 105 12/16/2021 1147   CO2 27 12/16/2021 1147   GLUCOSE 98 12/16/2021 1147   BUN 9 12/16/2021 1147   CREATININE 0.58 12/16/2021 1147   CREATININE 0.75 03/23/2011 1550   CALCIUM 8.5 (L) 12/16/2021 1147   PROT 6.2 (L) 12/16/2021 1147   ALBUMIN 3.2 (L) 12/16/2021 1147   AST 11 (L) 12/16/2021  1147   ALT 13 12/16/2021 1147   ALKPHOS 48 12/16/2021 1147   BILITOT 0.4 12/16/2021 1147   GFRNONAA >60 12/16/2021 1147    No results found for: "SPEP", "UPEP"  Lab Results  Component Value Date   WBC 15.0 (H) 12/16/2021   NEUTROABS 12.3 (H) 12/16/2021   HGB 10.3 (L) 12/16/2021   HCT 31.2 (L) 12/16/2021   MCV 91.2 12/16/2021   PLT 568 (H) 12/16/2021      Chemistry      Component Value Date/Time   NA 138 12/16/2021 1147   K 3.9 12/16/2021 1147   CL 105 12/16/2021 1147   CO2 27 12/16/2021 1147   BUN 9 12/16/2021 1147   CREATININE 0.58 12/16/2021 1147   CREATININE 0.75 03/23/2011 1550      Component Value Date/Time   CALCIUM 8.5 (L) 12/16/2021 1147   ALKPHOS 48 12/16/2021 1147   AST 11 (L) 12/16/2021 1147   ALT 13  12/16/2021 1147   BILITOT 0.4 12/16/2021 1147

## 2021-12-20 NOTE — Assessment & Plan Note (Addendum)
Her pulmonary function test is adequate We will proceed with treatment as scheduled I gave her a copy of the final pathology report We reviewed side effects of treatment again Due to the aggressive nature of her disease and high risk of bowel obstruction, I plan to see her every week for toxicity review We discussed the goal of treatment is of curative intent

## 2021-12-20 NOTE — Progress Notes (Signed)
[facility] CSW Progress Note  Holiday representative met with patient for support during first infusion. Pt's dad was present with her. Provided comfort items for pt and counseling resource list for her children. CSW will plan to see pt intermittently through treatment. Encouraged pt to call between visits as needed.    Kanton Kamel E Srihaan Mastrangelo, LCSW

## 2021-12-20 NOTE — Patient Instructions (Signed)
[facility] ONCOLOGY  Discharge Instructions: Thank you for choosing [facility] to provide your oncology and hematology care.   If you have a lab appointment with the [facility], please go directly to the [facility] and check in at the registration area.   Wear comfortable clothing and clothing appropriate for easy access to any Portacath or PICC line.   We strive to give you quality time with your provider. You may need to reschedule your appointment if you arrive late (15 or more minutes).  Arriving late affects you and other patients whose appointments are after yours.  Also, if you miss three or more appointments without notifying the office, you may be dismissed from the clinic at the provider's discretion.      For prescription refill requests, have your pharmacy contact our office and allow 72 hours for refills to be completed.    Today you received the following chemotherapy and/or immunotherapy agents Cisplatin & Etoposide      To help prevent nausea and vomiting after your treatment, we encourage you to take your nausea medication as directed.  BELOW ARE SYMPTOMS THAT SHOULD BE REPORTED IMMEDIATELY: *FEVER GREATER THAN 100.4 F (38 C) OR HIGHER *CHILLS OR SWEATING *NAUSEA AND VOMITING THAT IS NOT CONTROLLED WITH YOUR NAUSEA MEDICATION *UNUSUAL SHORTNESS OF BREATH *UNUSUAL BRUISING OR BLEEDING *URINARY PROBLEMS (pain or burning when urinating, or frequent urination) *BOWEL PROBLEMS (unusual diarrhea, constipation, pain near the anus) TENDERNESS IN MOUTH AND THROAT WITH OR WITHOUT PRESENCE OF ULCERS (sore throat, sores in mouth, or a toothache) UNUSUAL RASH, SWELLING OR PAIN  UNUSUAL VAGINAL DISCHARGE OR ITCHING   Items with * indicate a potential emergency and should be followed up as soon as possible or go to the Emergency Department if any problems should occur.  Please show the CHEMOTHERAPY ALERT CARD or IMMUNOTHERAPY ALERT CARD at  check-in to the Emergency Department and triage nurse.  Should you have questions after your visit or need to cancel or reschedule your appointment, please contact [facility]  Dept: [phone]  and follow the prompts.  Office hours are 8:00 a.m. to 4:30 p.m. Monday - Friday. Please note that voicemails left after 4:00 p.m. may not be returned until the following business day.  We are closed weekends and major holidays. You have access to a nurse at all times for urgent questions. Please call the main number to the clinic Dept: [phone] and follow the prompts.   For any non-urgent questions, you may also contact your provider using MyChart. We now offer e-Visits for anyone 20 and older to request care online for non-urgent symptoms. For details visit [email or web address].   Also download the MyChart app! Go to the app store, search "MyChart", open the app, select [facility], and log in with your MyChart username and password.  Masks are optional in the cancer centers. If you would like for your care team to wear a mask while they are taking care of you, please let them know. You may have one support person who is at least 50 years old accompany you for your appointments.  Cisplatin Injection What is this medication? CISPLATIN (SIS pla tin) treats some types of cancer. It works by slowing down the growth of cancer cells. This medicine may be used for other purposes; ask your health care provider or pharmacist if you have questions. COMMON BRAND NAME(S): Platinol, Platinol -AQ What should I tell my care team before I  take this medication? They need to know if you have any of these conditions: Eye disease, vision problems Hearing problems Kidney disease Low blood counts, such as low white cells, platelets, or red blood cells Tingling of the fingers or toes, or other nerve disorder An unusual or allergic reaction to cisplatin, carboplatin, oxaliplatin, other  medications, foods, dyes, or preservatives If you or your partner are pregnant or trying to get pregnant Breast-feeding How should I use this medication? This medication is injected into a vein. It is given by your care team in a hospital or clinic setting. Talk to your care team about the use of this medication in children. Special care may be needed. Overdosage: If you think you have taken too much of this medicine contact a poison control center or emergency room at once. NOTE: This medicine is only for you. Do not share this medicine with others. What if I miss a dose? Keep appointments for follow-up doses. It is important not to miss your dose. Call your care team if you are unable to keep an appointment. What may interact with this medication? Do not take this medication with any of the following: Live virus vaccines This medication may also interact with the following: Certain antibiotics, such as amikacin, gentamicin, neomycin, polymyxin B, streptomycin, tobramycin, vancomycin Foscarnet This list may not describe all possible interactions. Give your health care provider a list of all the medicines, herbs, non-prescription drugs, or dietary supplements you use. Also tell them if you smoke, drink alcohol, or use illegal drugs. Some items may interact with your medicine. What should I watch for while using this medication? Your condition will be monitored carefully while you are receiving this medication. You may need blood work done while taking this medication. This medication may make you feel generally unwell. This is not uncommon, as chemotherapy can affect healthy cells as well as cancer cells. Report any side effects. Continue your course of treatment even though you feel ill unless your care team tells you to stop. This medication may increase your risk of getting an infection. Call your care team for advice if you get a fever, chills, sore throat, or other symptoms of a cold or flu.  Do not treat yourself. Try to avoid being around people who are sick. Avoid taking medications that contain aspirin, acetaminophen, ibuprofen, naproxen, or ketoprofen unless instructed by your care team. These medications may hide a fever. This medication may increase your risk to bruise or bleed. Call your care team if you notice any unusual bleeding. Be careful brushing or flossing your teeth or using a toothpick because you may get an infection or bleed more easily. If you have any dental work done, tell your dentist you are receiving this medication. Drink fluids as directed while you are taking this medication. This will help protect your kidneys. Call your care team if you get diarrhea. Do not treat yourself. Talk to your care team if you or your partner wish to become pregnant or think you might be pregnant. This medication can cause serious birth defects if taken during pregnancy and for 14 months after the last dose. A negative pregnancy test is required before starting this medication. A reliable form of contraception is recommended while taking this medication and for 14 months after the last dose. Talk to your care team about effective forms of contraception. Do not father a child while taking this medication and for 11 months after the last dose. Use a condom during sex  during this time period. Do not breast-feed while taking this medication. This medication may cause infertility. Talk to your care team if you are concerned about your fertility. What side effects may I notice from receiving this medication? Side effects that you should report to your care team as soon as possible: Allergic reactions--skin rash, itching, hives, swelling of the face, lips, tongue, or throat Eye pain, change in vision, vision loss Hearing loss, ringing in ears Infection--fever, chills, cough, sore throat, wounds that don't heal, pain or trouble when passing urine, general feeling of discomfort or being  unwell Kidney injury--decrease in the amount of urine, swelling of the ankles, hands, or feet Low red blood cell level--unusual weakness or fatigue, dizziness, headache, trouble breathing Painful swelling, warmth, or redness of the skin, blisters or sores at the infusion site Pain, tingling, or numbness in the hands or feet Unusual bruising or bleeding Side effects that usually do not require medical attention (report to your care team if they continue or are bothersome): Hair loss Nausea Vomiting This list may not describe all possible side effects. Call your doctor for medical advice about side effects. You may report side effects to FDA at 1-800-FDA-1088. Where should I keep my medication? This medication is given in a hospital or clinic. It will not be stored at home. NOTE: This sheet is a summary. It may not cover all possible information. If you have questions about this medicine, talk to your doctor, pharmacist, or health care provider.  2023 Elsevier/Gold Standard (2021-05-28 00:00:00)  Etoposide Injection What is this medication? ETOPOSIDE (e toe POE side) treats some types of cancer. It works by slowing down the growth of cancer cells. This medicine may be used for other purposes; ask your health care provider or pharmacist if you have questions. COMMON BRAND NAME(S): Etopophos, Toposar, VePesid What should I tell my care team before I take this medication? They need to know if you have any of these conditions: Infection Kidney disease Liver disease Low blood counts, such as low white cell, platelet, red cell counts An unusual or allergic reaction to etoposide, other medications, foods, dyes, or preservatives If you or your partner are pregnant or trying to get pregnant Breastfeeding How should I use this medication? This medication is injected into a vein. It is given by your care team in a hospital or clinic setting. Talk to your care team about the use of this medication  in children. Special care may be needed. Overdosage: If you think you have taken too much of this medicine contact a poison control center or emergency room at once. NOTE: This medicine is only for you. Do not share this medicine with others. What if I miss a dose? Keep appointments for follow-up doses. It is important not to miss your dose. Call your care team if you are unable to keep an appointment. What may interact with this medication? Warfarin This list may not describe all possible interactions. Give your health care provider a list of all the medicines, herbs, non-prescription drugs, or dietary supplements you use. Also tell them if you smoke, drink alcohol, or use illegal drugs. Some items may interact with your medicine. What should I watch for while using this medication? Your condition will be monitored carefully while you are receiving this medication. This medication may make you feel generally unwell. This is not uncommon as chemotherapy can affect healthy cells as well as cancer cells. Report any side effects. Continue your course of treatment even though  you feel ill unless your care team tells you to stop. This medication can cause serious side effects. To reduce the risk, your care team may give you other medications to take before receiving this one. Be sure to follow the directions from your care team. This medication may increase your risk of getting an infection. Call your care team for advice if you get a fever, chills, sore throat, or other symptoms of a cold or flu. Do not treat yourself. Try to avoid being around people who are sick. This medication may increase your risk to bruise or bleed. Call your care team if you notice any unusual bleeding. Talk to your care team about your risk of cancer. You may be more at risk for certain types of cancers if you take this medication. Talk to your care team if you may be pregnant. Serious birth defects can occur if you take this  medication during pregnancy and for 6 months after the last dose. You will need a negative pregnancy test before starting this medication. Contraception is recommended while taking this medication and for 6 months after the last dose. Your care team can help you find the option that works for you. If your partner can get pregnant, use a condom during sex while taking this medication and for 4 months after the last dose. Do not breastfeed while taking this medication. This medication may cause infertility. Talk to your care team if you are concerned about your fertility. What side effects may I notice from receiving this medication? Side effects that you should report to your care team as soon as possible: Allergic reactions--skin rash, itching, hives, swelling of the face, lips, tongue, or throat Infection--fever, chills, cough, sore throat, wounds that don't heal, pain or trouble when passing urine, general feeling of discomfort or being unwell Low red blood cell level--unusual weakness or fatigue, dizziness, headache, trouble breathing Unusual bruising or bleeding Side effects that usually do not require medical attention (report to your care team if they continue or are bothersome): Diarrhea Fatigue Hair loss Loss of appetite Nausea Vomiting This list may not describe all possible side effects. Call your doctor for medical advice about side effects. You may report side effects to FDA at 1-800-FDA-1088. Where should I keep my medication? This medication is given in a hospital or clinic. It will not be stored at home. NOTE: This sheet is a summary. It may not cover all possible information. If you have questions about this medicine, talk to your doctor, pharmacist, or health care provider.  2023 Elsevier/Gold Standard (2007-03-24 00:00:00)

## 2021-12-21 ENCOUNTER — Inpatient Hospital Stay: Payer: 59

## 2021-12-21 ENCOUNTER — Other Ambulatory Visit: Payer: Self-pay

## 2021-12-21 ENCOUNTER — Encounter: Payer: Self-pay | Admitting: General Practice

## 2021-12-21 VITALS — BP 116/81 | HR 94 | Temp 98.4°F | Resp 18

## 2021-12-21 DIAGNOSIS — C569 Malignant neoplasm of unspecified ovary: Secondary | ICD-10-CM

## 2021-12-21 DIAGNOSIS — Z5111 Encounter for antineoplastic chemotherapy: Secondary | ICD-10-CM | POA: Diagnosis not present

## 2021-12-21 MED ORDER — SODIUM CHLORIDE 0.9 % IV SOLN
10.0000 mg | Freq: Once | INTRAVENOUS | Status: AC
  Administered 2021-12-21: 10 mg via INTRAVENOUS
  Filled 2021-12-21: qty 10

## 2021-12-21 MED ORDER — SODIUM CHLORIDE 0.9 % IV SOLN
20.0000 mg/m2 | Freq: Once | INTRAVENOUS | Status: AC
  Administered 2021-12-21: 30 mg via INTRAVENOUS
  Filled 2021-12-21: qty 30

## 2021-12-21 MED ORDER — MAGNESIUM SULFATE 2 GM/50ML IV SOLN
2.0000 g | Freq: Once | INTRAVENOUS | Status: AC
  Administered 2021-12-21: 2 g via INTRAVENOUS
  Filled 2021-12-21: qty 50

## 2021-12-21 MED ORDER — SODIUM CHLORIDE 0.9 % IV SOLN
30.0000 [IU] | Freq: Once | INTRAVENOUS | Status: AC
  Administered 2021-12-21: 30 [IU] via INTRAVENOUS
  Filled 2021-12-21: qty 10

## 2021-12-21 MED ORDER — SODIUM CHLORIDE 0.9 % IV SOLN
100.0000 mg/m2 | Freq: Once | INTRAVENOUS | Status: AC
  Administered 2021-12-21: 150 mg via INTRAVENOUS
  Filled 2021-12-21: qty 7.5

## 2021-12-21 MED ORDER — POTASSIUM CHLORIDE IN NACL 20-0.9 MEQ/L-% IV SOLN
Freq: Once | INTRAVENOUS | Status: AC
  Filled 2021-12-21: qty 1000

## 2021-12-21 MED ORDER — SODIUM CHLORIDE 0.9 % IV SOLN: Freq: Once | INTRAVENOUS | Status: AC

## 2021-12-21 MED ORDER — SODIUM CHLORIDE 0.9% FLUSH
10.0000 mL | INTRAVENOUS | Status: DC | PRN
  Administered 2021-12-21: 10 mL

## 2021-12-21 MED ORDER — HEPARIN SOD (PORK) LOCK FLUSH 100 UNIT/ML IV SOLN
500.0000 [IU] | Freq: Once | INTRAVENOUS | Status: AC | PRN
  Administered 2021-12-21: 500 [IU]

## 2021-12-21 MED FILL — Fosaprepitant Dimeglumine For IV Infusion 150 MG (Base Eq): INTRAVENOUS | Qty: 5 | Status: AC

## 2021-12-21 MED FILL — Dexamethasone Sodium Phosphate Inj 100 MG/10ML: INTRAMUSCULAR | Qty: 1 | Status: AC

## 2021-12-21 NOTE — Progress Notes (Signed)
[facility] Spiritual Care Note  Following Darlinda for spiritual and emotional support. Visited infusion today, providing pastoral presence and empathic listening as she shared and processed the story of her diagnosis and the support she is receiving from family, friends, and Medical laboratory scientific officer.  Letta is very appreciative of Dr Alvy Bimler and finds hope in her observation that such rapidly growing tumors tend to be targeted well and dramatically by treatment.  Will continue to be a supportive presence. Hilde is aware of ongoing chaplain availability and knows to reach out whenever needed/desired, as well.  [place], [place], [facility] Pager [phone] Voicemail [phone]

## 2021-12-21 NOTE — Patient Instructions (Signed)
[facility]  Discharge Instructions: Thank you for choosing [facility] to provide your oncology and hematology care.   If you have a lab appointment with the [facility], please go directly to the [facility] and check in at the registration area.   Wear comfortable clothing and clothing appropriate for easy access to any Portacath or PICC line.   We strive to give you quality time with your provider. You may need to reschedule your appointment if you arrive late (15 or more minutes).  Arriving late affects you and other patients whose appointments are after yours.  Also, if you miss three or more appointments without notifying the office, you may be dismissed from the clinic at the provider's discretion.      For prescription refill requests, have your pharmacy contact our office and allow 72 hours for refills to be completed.    Today you received the following chemotherapy and/or immunotherapy agents; Bleomycin, Cisplatin, & Etoposide      To help prevent nausea and vomiting after your treatment, we encourage you to take your nausea medication as directed.  BELOW ARE SYMPTOMS THAT SHOULD BE REPORTED IMMEDIATELY: *FEVER GREATER THAN 100.4 F (38 C) OR HIGHER *CHILLS OR SWEATING *NAUSEA AND VOMITING THAT IS NOT CONTROLLED WITH YOUR NAUSEA MEDICATION *UNUSUAL SHORTNESS OF BREATH *UNUSUAL BRUISING OR BLEEDING *URINARY PROBLEMS (pain or burning when urinating, or frequent urination) *BOWEL PROBLEMS (unusual diarrhea, constipation, pain near the anus) TENDERNESS IN MOUTH AND THROAT WITH OR WITHOUT PRESENCE OF ULCERS (sore throat, sores in mouth, or a toothache) UNUSUAL RASH, SWELLING OR PAIN  UNUSUAL VAGINAL DISCHARGE OR ITCHING   Items with * indicate a potential emergency and should be followed up as soon as possible or go to the Emergency Department if any problems should occur.  Please show the CHEMOTHERAPY ALERT CARD or IMMUNOTHERAPY  ALERT CARD at check-in to the Emergency Department and triage nurse.  Should you have questions after your visit or need to cancel or reschedule your appointment, please contact [facility]  Dept: [phone]  and follow the prompts.  Office hours are 8:00 a.m. to 4:30 p.m. Monday - Friday. Please note that voicemails left after 4:00 p.m. may not be returned until the following business day.  We are closed weekends and major holidays. You have access to a nurse at all times for urgent questions. Please call the main number to the clinic Dept: [phone] and follow the prompts.   For any non-urgent questions, you may also contact your provider using MyChart. We now offer e-Visits for anyone 20 and older to request care online for non-urgent symptoms. For details visit [email or web address].   Also download the MyChart app! Go to the app store, search "MyChart", open the app, select [facility], and log in with your MyChart username and password.  Masks are optional in the cancer centers. If you would like for your care team to wear a mask while they are taking care of you, please let them know. You may have one support [name] who is at least 50 years old accompany you for your appointments.

## 2021-12-22 ENCOUNTER — Other Ambulatory Visit (HOSPITAL_COMMUNITY): Payer: Self-pay

## 2021-12-22 ENCOUNTER — Encounter: Payer: Self-pay | Admitting: Hematology and Oncology

## 2021-12-22 ENCOUNTER — Other Ambulatory Visit: Payer: Self-pay

## 2021-12-22 ENCOUNTER — Inpatient Hospital Stay (HOSPITAL_BASED_OUTPATIENT_CLINIC_OR_DEPARTMENT_OTHER): Payer: 59 | Admitting: Physician Assistant

## 2021-12-22 ENCOUNTER — Inpatient Hospital Stay: Payer: 59

## 2021-12-22 VITALS — BP 129/74 | HR 100 | Temp 98.2°F | Resp 18 | Wt 131.0 lb

## 2021-12-22 DIAGNOSIS — C569 Malignant neoplasm of unspecified ovary: Secondary | ICD-10-CM

## 2021-12-22 DIAGNOSIS — Z5111 Encounter for antineoplastic chemotherapy: Secondary | ICD-10-CM | POA: Diagnosis not present

## 2021-12-22 DIAGNOSIS — K5900 Constipation, unspecified: Secondary | ICD-10-CM

## 2021-12-22 DIAGNOSIS — R601 Generalized edema: Secondary | ICD-10-CM

## 2021-12-22 MED ORDER — SODIUM CHLORIDE 0.9 % IV SOLN
100.0000 mg/m2 | Freq: Once | INTRAVENOUS | Status: AC
  Administered 2021-12-22: 150 mg via INTRAVENOUS
  Filled 2021-12-22: qty 7.5

## 2021-12-22 MED ORDER — POTASSIUM CHLORIDE IN NACL 20-0.9 MEQ/L-% IV SOLN
Freq: Once | INTRAVENOUS | Status: AC
  Filled 2021-12-22: qty 1000

## 2021-12-22 MED ORDER — MAGNESIUM SULFATE 2 GM/50ML IV SOLN
2.0000 g | Freq: Once | INTRAVENOUS | Status: AC
  Administered 2021-12-22: 2 g via INTRAVENOUS
  Filled 2021-12-22: qty 50

## 2021-12-22 MED ORDER — SODIUM CHLORIDE 0.9 % IV SOLN
20.0000 mg/m2 | Freq: Once | INTRAVENOUS | Status: AC
  Administered 2021-12-22: 30 mg via INTRAVENOUS
  Filled 2021-12-22: qty 30

## 2021-12-22 MED ORDER — SODIUM CHLORIDE 0.9% FLUSH
10.0000 mL | INTRAVENOUS | Status: DC | PRN
  Administered 2021-12-22: 10 mL

## 2021-12-22 MED ORDER — PALONOSETRON HCL INJECTION 0.25 MG/5ML
0.2500 mg | Freq: Once | INTRAVENOUS | Status: AC
  Administered 2021-12-22: 0.25 mg via INTRAVENOUS
  Filled 2021-12-22: qty 5

## 2021-12-22 MED ORDER — SODIUM CHLORIDE 0.9 % IV SOLN
10.0000 mg | Freq: Once | INTRAVENOUS | Status: AC
  Administered 2021-12-22: 10 mg via INTRAVENOUS
  Filled 2021-12-22: qty 10

## 2021-12-22 MED ORDER — SODIUM CHLORIDE 0.9 % IV SOLN: Freq: Once | INTRAVENOUS | Status: AC

## 2021-12-22 MED ORDER — HEPARIN SOD (PORK) LOCK FLUSH 100 UNIT/ML IV SOLN
500.0000 [IU] | Freq: Once | INTRAVENOUS | Status: AC | PRN
  Administered 2021-12-22: 500 [IU]

## 2021-12-22 MED ORDER — FUROSEMIDE 20 MG PO TABS
20.0000 mg | ORAL_TABLET | Freq: Every day | ORAL | 0 refills | Status: DC
  Filled 2021-12-22: qty 5, 5d supply, fill #0

## 2021-12-22 MED ORDER — SODIUM CHLORIDE 0.9 % IV SOLN
150.0000 mg | Freq: Once | INTRAVENOUS | Status: AC
  Administered 2021-12-22: 150 mg via INTRAVENOUS
  Filled 2021-12-22: qty 150

## 2021-12-22 MED ORDER — FUROSEMIDE 10 MG/ML IJ SOLN
20.0000 mg | Freq: Once | INTRAMUSCULAR | Status: AC
  Administered 2021-12-22: 20 mg via INTRAVENOUS
  Filled 2021-12-22: qty 2

## 2021-12-22 NOTE — Progress Notes (Signed)
Symptom Management Consult note [facility]    Patient Care Team: [name], Linda Blackwater, MD as PCP - General (Family Medicine) Linda Osmond, MD as PCP - Cardiology (Cardiology) Linda Few, MD as Consulting Physician (Obstetrics and Gynecology) Linda Pulley, DO as Consulting Physician (Family Medicine)    Name of the patient: [name]  [id number]  August 17, 1970   Date of visit: 12/22/2021   Chief Complaint/Reason for visit: constipation   Current Therapy: Cisplatin, Belomycin, Etoposide  Last treatment:  Day 2   Cycle 1 on 12/21/21   ASSESSMENT & PLAN: Patient is a 51 y.o. 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 Alvy Palmer.  I have viewed most recent oncology note and lab work.    #) Malignant germ cell ovarian tumor -Started treatment this week. Here for treatment today. -Next appointment with oncologist is 12/28/21   #) Constipation  -No BM in x 2 days, passing flatus. No tenderness on exam and she is denying pain. She has high risk for SBO with her disease which I discussed at length how her constipation can be concerning for that. Depending on repeat abdominal exam tomorrow and if she has BM will consider imaging for further evaluation. -Patient knows to increase Miralax to BID dosing and start OTC stool softener.  #) Generalized Edema -Exam showing bilateral lower extremity edema and abdomen is distended. Weight is up 10 lbs from x 2 days ago. -Patient receiving 1L fluids daily x 3 days during treatment.  -Ddx includes fluid overload, ascites, concern for worsening malignancy. SBP less likely given known tumor, no abdominal tenderness or peritoneal signs, no fever. -Engaged in shared decision making and patient prefers to hold off on imaging at this time and try lasix. She was given '20mg'$  lasix during treatment today, and sent home with short course of PO lasix to start tomorrow. She has no history of  renal insufficiency and CMP from 11/02 shows nl Cr. Reassessed patient after lasix and she has had good urine output while here. -We will weigh patient tomorrow when she is here for treatment and I will repeat abdominal exam. -Strict ED precautions discussed should symptoms worsen.     Heme/Onc History: Oncology History  Malignant germ cell tumor of ovary ([facility])  12/01/2021 Tumor Marker   CA-125 is elevated at 285, [place]-fet[place] [address], beta-hCG is elevated at 116   12/02/2021 Imaging   MRI pelvis  1. 21.1 by 10.9 by 18.7 cm abdominopelvic mass, with a satellite mass in the right upper quadrant anteriorly, and moderate ascites with tumor deposits along the pelvic ascites posteriorly. The mass could be arising from right or left ovary, or both. The mass displaces the bowel and IVC, but there is no IVC thrombus or pelvic DVT identified. 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 2. Additional satellite mass above the liver is partially included, along with tumor deposits along the ascites. 3. Low-level edema along the lateral abdominal wall musculature. 4. Flattening of the IVC at the level of the aortic bifurcation due to the large abdominopelvic tumor. No findings of pelvic DVT.     12/07/2021 Imaging   CT abdomen and pelvis 1. Large central abdominal and pelvic mass consistent with known malignancy. This is incompletely visualized by this CT of the abdomen, but was seen on recent pelvic MRI. 2. Large subcapsular metastasis involving the superior aspect of the right hepatic lobe with possible  invasion of the liver. Right omental implant consistent with metastatic disease. Small amount of abdominal ascites. 3. No evidence for bowel or ureteral obstruction. 4. Aortic Atherosclerosis (ICD10-I70.0). 5. Chest findings dictated separately.   12/07/2021 Imaging   CT chest 1. Negative for pulmonary embolism. And no acute or metastatic process  identified in the Chest. 2. Partially visible liver mass and ascites, staging CT Abdomen today is reported separately.     12/07/2021 Procedure   Successful placement of a right internal jugular approach power injectable Port-A-Cath. The catheter is ready for immediate use.   12/07/2021 Procedure   Ultrasound-guided biopsy of right upper quadrant mass.    12/08/2021 Initial Diagnosis   Ovarian cancer ([facility])   12/08/2021 Cancer Staging   Staging form: Ovary, Fallopian Tube, and Primary Peritoneal Carcinoma, AJCC 8th Edition - Clinical stage from 12/08/2021: FIGO Stage IIIC (cT3c, cN0, cM0) - Signed by Linda Lark, MD on 12/10/2021 Stage prefix: Initial diagnosis   12/20/2021 -  Chemotherapy   Patient is on Treatment Plan : Ovarian germ cell tumor q21d x 3 Cycles         Interval history-: [name] is a 51 y.o. female with oncologic history as above seen in the infusion center today with chief complaint of constipation and swelling.  She is accompanied today by a family member.  Patient reports constipation x2 days.  Her last bowel movement was normal consistency and she does not have a history of constipation.  She states she is still passing gas.  She started taking MiraLAX x2 nights ago.  Her abdomen is more distended than usual she noticed yesterday. She denies any associate abdominal pain. She has had decreased appetite although relates that to constant GERD. She is tolerating PO intake. She was told she has fluid in her abdomen however has not had it drained in the past.  Patient is also reporting swelling in her legs. She states she just feels puffy all over. She usually sits with her feet crossed, not hanging down. She  denies history of lower extremity edema or cardiac disease. She denies any fever, chills, chest pain, nausea, vomiting, urinary symptoms, numbness, tingling. No OTC medications taken PTA.      ROS  All other systems are reviewed and are negative for  acute change except as noted in the HPI.    Allergies  Allergen Reactions   Latex Other (See Comments)    Sensitivity only   Sulfa Antibiotics Hives and Other (See Comments)   Sulfamethoxazole-Trimethoprim Rash   Septra [Bactrim] Rash     Past Medical History:  Diagnosis Date   Arthritis    Endometriosis    Family history of breast cancer in mother    at age 80   GAD (generalized anxiety disorder)    lexapro helped 2017 (Dr. Hoy Finlay did have extra stress of the Master's degree program on her at that time.  Got counseling.       GERD (gastroesophageal reflux disease)    Gestational diabetes    Kidney stones    Low back pain    Dr. Charlann Boxer.   Neck pain    and left shoulder pain: managed by osteopathic manipulation by Dr. Charlann Boxer. ?cervical radiculopathy   PAC (premature atrial contraction)      Past Surgical History:  Procedure Laterality Date   ABDOMINAL EXPLORATION SURGERY  2009   fulguration of endometriosis - lsc surgery   IR IMAGING GUIDED PORT INSERTION  12/08/2021   IR US  GUIDE BX ASP/DRAIN  12/08/2021   SALPINGECTOMY Right 2009   TONSILLECTOMY     TUBAL LIGATION Left 2009    Social History   Socioeconomic History   Marital status: Married    Spouse name: Not on file   Number of children: 3   Years of education: Not on file   Highest education level: Not on file  Occupational History   Occupation: nurse    Employer: [employer]    Comment: at [employer]   Tobacco Use   Smoking status: Never   Smokeless tobacco: Never  Vaping Use   Vaping Use: Never used  Substance and Sexual Activity   Alcohol use: Yes    Comment: Occasionally   Drug use: No   Sexual activity: Yes    Partners: Male    Birth control/protection: Surgical  Other Topics Concern   Not on file  Social History Narrative   Married, 3 kids.    Lives in [place].   Educ: Masters deg nursing.   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   No tob/drugs.   Alc: social.   Social Determinants of Health   Financial Resource Strain: Not on file  Food Insecurity: No Food Insecurity (12/15/2021)   Hunger Vital Sign    Worried About Running Out of Food in the Last Year: Never true    Ran Out of Food in the Last Year: Never true  Transportation Needs: No Transportation Needs (12/15/2021)   PRAPARE - Hydrologist (Medical): No    Lack of Transportation (Non-Medical): No  Physical Activity: Not on file  Stress: Not on file  Social Connections: Not on file  Intimate Partner Violence: Not on file    Family History  Problem Relation Age of Onset   Rheum arthritis Mother    Stroke Mother    Hypertension Mother    Heart disease Mother    Arthritis Mother    Breast cancer Mother    Heart attack Mother 90   Diabetes Father    Heart disease Father    Hyperlipidemia Father    Hypertension Father    Heart attack Father 15   Heart disease Maternal Grandmother    Heart attack Maternal Grandmother 88   Arthritis Maternal Grandmother    Linda death Maternal Grandfather        MVA   Diabetes Paternal Grandmother    Hypertension Paternal Grandmother    Heart disease Paternal Grandmother    Heart attack Paternal Grandmother 43   Heart disease Paternal Grandfather    Heart attack Paternal Grandfather 92   Coronary artery disease Other    Glaucoma Other    Non-Hodgkin's lymphoma Paternal Uncle      Current Outpatient Medications:    [START ON 12/23/2021] furosemide (LASIX) 20 MG tablet, Take 1 tablet (20 mg total) by mouth daily for 5 days., Disp: 5 tablet, Rfl: 0   apixaban (ELIQUIS) 2.5 MG TABS tablet, Take 1 tablet (2.5 mg total) by mouth 2 (two) times daily. Wait to start this until after you have had your procedures, Disp: 60 tablet, Rfl: 6   dexamethasone (DECADRON) 4 MG tablet, Take 2 tablets (8 mg total) by mouth daily. Start the day after last  cisplatin dose on day 6 of chemo for 3 days. Take with food., Disp: 30 tablet, Rfl: 1   famotidine (PEPCID) 10  MG tablet, Take 10 mg by mouth daily as needed for heartburn or indigestion., Disp: , Rfl:    lidocaine-prilocaine (EMLA) cream, Apply to affected area once, Disp: 30 g, Rfl: 3   loratadine (CLARITIN) 10 MG tablet, Take 10 mg by mouth daily as needed for allergies., Disp: , Rfl:    LORazepam (ATIVAN) 0.5 MG tablet, Take 1 tablet (0.5 mg total) by mouth every 8 (eight) hours., Disp: 30 tablet, Rfl: 0   ondansetron (ZOFRAN) 8 MG tablet, Take 1 tablet (8 mg total) by mouth every 8 (eight) hours as needed for nausea or vomiting. Start on the third day after last cisplatin dose on day 8 of chemo, Disp: 30 tablet, Rfl: 1   prochlorperazine (COMPAZINE) 10 MG tablet, Take 1 tablet (10 mg total) by mouth every 6 (six) hours as needed for nausea or vomiting., Disp: 30 tablet, Rfl: 1 No current facility-administered medications for this visit.  Facility-Administered Medications Ordered in Other Visits:    etoposide (VEPESID) 150 mg in sodium chloride 0.9 % 500 mL chemo infusion, 100 mg/m2 (Treatment Plan Recorded), Intravenous, Once, Gorsuch, Ni, MD   heparin lock flush 100 unit/mL, 500 Units, Intracatheter, Once PRN, [name], Ni, MD   sodium chloride flush (NS) 0.9 % injection 10 mL, 10 mL, Intracatheter, PRN, [name], Ni, MD, 10 mL at 12/20/21 1637   sodium chloride flush (NS) 0.9 % injection 10 mL, 10 mL, Intracatheter, PRN, [name], Ni, MD  PHYSICAL EXAM: ECOG FS:1 - Symptomatic but completely ambulatory  T: 98   BP: 117/68    HR: 108     Resp: 18    O2: 99% Physical Exam Vitals and nursing note reviewed.  Constitutional:      Appearance: She is well-developed. She is not ill-appearing or toxic-appearing.  HENT:     Head: Normocephalic.     Nose: Nose normal.  Eyes:     Conjunctiva/sclera: Conjunctivae normal.  Neck:     Vascular: No JVD.  Cardiovascular:     Rate and Rhythm: Normal  rate and regular rhythm.     Pulses: Normal pulses.          Dorsalis pedis pulses are 2+ on the right side and 2+ on the left side.     Heart sounds: Normal heart sounds.  Pulmonary:     Effort: Pulmonary effort is normal.     Breath sounds: Normal breath sounds.  Abdominal:     General: Bowel sounds are normal.     Tenderness: There is no right CVA tenderness or left CVA tenderness.     Comments: Grossly distended. No tenderness or peritoneal signs  Musculoskeletal:     Cervical back: Normal range of motion.     Right lower leg: 1+ Pitting Edema present.     Left lower leg: 1+ Pitting Edema present.  Skin:    General: Skin is warm and dry.     Comments: Equal tactile temperature in bilateral lower extremities  Neurological:     Mental Status: She is oriented to person, place, and time.        LABORATORY DATA: I have reviewed the data as listed    Latest Ref Rng & Units 12/16/2021   11:47 AM 12/08/2021    8:37 AM 12/07/2021   11:23 AM  CBC  WBC 4.0 - 10.5 K/uL 15.0  16.4  19.3   Hemoglobin 12.0 - 15.0 g/dL 10.3  11.9  12.4   Hematocrit 36.0 - 46.0 % 31.2  36.3  37.9   Platelets 150 - 400 K/uL 568  584  610         Latest Ref Rng & Units 12/16/2021   11:47 AM 12/07/2021   11:23 AM 03/23/2011    3:50 PM  CMP  Glucose 70 - 99 mg/dL 98  126  88   BUN 6 - 20 mg/dL '9  9  14   '$ Creatinine 0.44 - 1.00 mg/dL 0.58  0.67  0.75   Sodium 135 - 145 mmol/L 138  137  138   Potassium 3.5 - 5.1 mmol/L 3.9  4.3  4.0   Chloride 98 - 111 mmol/L 105  101  103   CO2 22 - 32 mmol/L '27  28  24   '$ Calcium 8.9 - 10.3 mg/dL 8.5  8.8  10.0   Total Protein 6.5 - 8.1 g/dL 6.2  6.6  7.0   Total Bilirubin 0.3 - 1.2 mg/dL 0.4  0.5  0.6   Alkaline Phos 38 - 126 U/L 48  50  35   AST 15 - 41 U/L '11  11  15   '$ ALT 0 - 44 U/L '13  10  11        '$ RADIOGRAPHIC STUDIES (from last 24 hours if applicable) I have personally reviewed the radiological images as listed and agreed with the findings in the  report. No results found.      Visit Diagnosis: 1. Generalized edema   2. Constipation, unspecified constipation type   3. Malignant neoplasm of ovary, unspecified laterality ([facility])      No orders of the defined types were placed in this encounter.   All questions were answered. The patient knows to call the clinic with any problems, questions or concerns. No barriers to learning was detected.  I have spent a total of 30 minutes minutes of face-to-face and non-face-to-face time, preparing to see the patient, obtaining and/or reviewing separately obtained history, performing a medically appropriate examination, counseling and educating the patient, ordering tests, documenting clinical information in the electronic health record, and care coordination (communications with other health care professionals or caregivers).    Thank you for allowing me to participate in the care of this patient.    Barrie Folk, PA-C Department of Hematology/Oncology [facility] at [facility] Phone: [phone]  [phone]    12/22/2021 12:24 PM

## 2021-12-22 NOTE — Patient Instructions (Signed)
[facility]COL[facility]  Discharge Instructions: Thank you for choosing [facility] to provide your oncology and hematology care.   If you have a lab appointment with the [facility], please go directly to the [facility] and check in at the registration area.   Wear comfortable clothing and clothing appropriate for easy access to any Portacath or PICC line.   We strive to give you quality time with your provider. You may need to reschedule your appointment if you arrive late (15 or more minutes).  Arriving late affects you and other patients whose appointments are after yours.  Also, if you miss three or more appointments without notifying the office, you may be dismissed from the clinic at the provider's discretion.      For prescription refill requests, have your pharmacy contact our office and allow 72 hours for refills to be completed.    Today you received the following chemotherapy and/or immunotherapy agents: Cisplatin, Etoposide.       To help prevent nausea and vomiting after your treatment, we encourage you to take your nausea medication as directed.  BELOW ARE SYMPTOMS THAT SHOULD BE REPORTED IMMEDIATELY: *FEVER GREATER THAN 100.4 F (38 C) OR HIGHER *CHILLS OR SWEATING *NAUSEA AND VOMITING THAT IS NOT CONTROLLED WITH YOUR NAUSEA MEDICATION *UNUSUAL SHORTNESS OF BREATH *UNUSUAL BRUISING OR BLEEDING *URINARY PROBLEMS (pain or burning when urinating, or frequent urination) *BOWEL PROBLEMS (unusual diarrhea, constipation, pain near the anus) TENDERNESS IN MOUTH AND THROAT WITH OR WITHOUT PRESENCE OF ULCERS (sore throat, sores in mouth, or a toothache) UNUSUAL RASH, SWELLING OR PAIN  UNUSUAL VAGINAL DISCHARGE OR ITCHING   Items with * indicate a potential emergency and should be followed up as soon as possible or go to the Emergency Department if any problems should occur.  Please show the CHEMOTHERAPY ALERT CARD or IMMUNOTHERAPY ALERT CARD at  check-in to the Emergency Department and triage nurse.  Should you have questions after your visit or need to cancel or reschedule your appointment, please contact [facility]  Dept: [phone]  and follow the prompts.  Office hours are 8:00 a.m. to 4:30 p.m. Monday - Friday. Please note that voicemails left after 4:00 p.m. may not be returned until the following business day.  We are closed weekends and major holidays. You have access to a nurse at all times for urgent questions. Please call the main number to the clinic Dept: [phone] and follow the prompts.   For any non-urgent questions, you may also contact your provider using MyChart. We now offer e-Visits for anyone 57 and older to request care online for non-urgent symptoms. For details visit [email or web address].   Also download the MyChart app! Go to the app store, search "MyChart", open the app, select [facility], and log in with your MyChart username and password.  Masks are optional in the cancer centers. If you would like for your care team to wear a mask while they are taking care of you, please let them know. You may have one support person who is at least 50 years old accompany you for your appointments.

## 2021-12-23 ENCOUNTER — Other Ambulatory Visit: Payer: Self-pay

## 2021-12-23 ENCOUNTER — Inpatient Hospital Stay (HOSPITAL_BASED_OUTPATIENT_CLINIC_OR_DEPARTMENT_OTHER): Payer: 59 | Admitting: Physician Assistant

## 2021-12-23 ENCOUNTER — Other Ambulatory Visit (HOSPITAL_BASED_OUTPATIENT_CLINIC_OR_DEPARTMENT_OTHER): Payer: 59

## 2021-12-23 ENCOUNTER — Inpatient Hospital Stay: Payer: 59

## 2021-12-23 ENCOUNTER — Encounter: Payer: Self-pay | Admitting: Hematology and Oncology

## 2021-12-23 VITALS — BP 116/76 | HR 81 | Temp 98.0°F | Resp 17 | Wt 133.2 lb

## 2021-12-23 DIAGNOSIS — C569 Malignant neoplasm of unspecified ovary: Secondary | ICD-10-CM

## 2021-12-23 DIAGNOSIS — R110 Nausea: Secondary | ICD-10-CM | POA: Diagnosis not present

## 2021-12-23 DIAGNOSIS — K5900 Constipation, unspecified: Secondary | ICD-10-CM

## 2021-12-23 DIAGNOSIS — Z5111 Encounter for antineoplastic chemotherapy: Secondary | ICD-10-CM | POA: Diagnosis not present

## 2021-12-23 MED ORDER — SODIUM CHLORIDE 0.9 % IV SOLN
10.0000 mg | Freq: Once | INTRAVENOUS | Status: AC
  Administered 2021-12-23: 10 mg via INTRAVENOUS
  Filled 2021-12-23: qty 10

## 2021-12-23 MED ORDER — MAGNESIUM SULFATE 2 GM/50ML IV SOLN
2.0000 g | Freq: Once | INTRAVENOUS | Status: AC
  Administered 2021-12-23: 2 g via INTRAVENOUS
  Filled 2021-12-23: qty 50

## 2021-12-23 MED ORDER — PROCHLORPERAZINE EDISYLATE 10 MG/2ML IJ SOLN
10.0000 mg | Freq: Once | INTRAMUSCULAR | Status: AC
  Administered 2021-12-23: 10 mg via INTRAVENOUS
  Filled 2021-12-23: qty 2

## 2021-12-23 MED ORDER — FUROSEMIDE 10 MG/ML IJ SOLN
20.0000 mg | Freq: Once | INTRAMUSCULAR | Status: AC
  Administered 2021-12-23: 20 mg via INTRAVENOUS
  Filled 2021-12-23: qty 2

## 2021-12-23 MED ORDER — HEPARIN SOD (PORK) LOCK FLUSH 100 UNIT/ML IV SOLN
500.0000 [IU] | Freq: Once | INTRAVENOUS | Status: AC | PRN
  Administered 2021-12-23: 500 [IU]

## 2021-12-23 MED ORDER — SODIUM CHLORIDE 0.9 % IV SOLN
20.0000 mg/m2 | Freq: Once | INTRAVENOUS | Status: AC
  Administered 2021-12-23: 30 mg via INTRAVENOUS
  Filled 2021-12-23: qty 30

## 2021-12-23 MED ORDER — SODIUM CHLORIDE 0.9% FLUSH
10.0000 mL | INTRAVENOUS | Status: DC | PRN
  Administered 2021-12-23: 10 mL

## 2021-12-23 MED ORDER — SODIUM CHLORIDE 0.9 % IV SOLN
100.0000 mg/m2 | Freq: Once | INTRAVENOUS | Status: AC
  Administered 2021-12-23: 150 mg via INTRAVENOUS
  Filled 2021-12-23: qty 7.5

## 2021-12-23 MED ORDER — SODIUM CHLORIDE 0.9 % IV SOLN: Freq: Once | INTRAVENOUS | Status: AC

## 2021-12-23 MED ORDER — POTASSIUM CHLORIDE IN NACL 20-0.9 MEQ/L-% IV SOLN
Freq: Once | INTRAVENOUS | Status: AC
  Filled 2021-12-23: qty 1000

## 2021-12-23 MED FILL — Dexamethasone Sodium Phosphate Inj 100 MG/10ML: INTRAMUSCULAR | Qty: 1 | Status: AC

## 2021-12-23 MED FILL — Fosaprepitant Dimeglumine For IV Infusion 150 MG (Base Eq): INTRAVENOUS | Qty: 5 | Status: AC

## 2021-12-23 NOTE — Progress Notes (Signed)
Pt reports feeling better compared to yesterday. Reports constipation has resolved. Bilateral lower extremity edema still noted. Weight this morning 133lb 4oz, pt took PO home lasix around 0815 in infusion room. Anda Kraft, [place] notified. Prehydration started. After about 1 hr and 33mn of prehydration, only 75cc of urine output, pt states she has not had much urine output prior to arriving in infusion either, [place] GAlvy Bimlernotified. Treatment paused until [place] chairside to assess. After assessment, treatment restarted, per [place] for IV lasix at the end of treatment. See flowsheet for urine output while pt in infusion. Plan to draw BMP tomorrow (12/24/21) prior to treatment. Pt aware and agrees.

## 2021-12-23 NOTE — Progress Notes (Signed)
Symptom Management Consult note [place]    Patient Care Team: McGowen, Adrian Blackwater, MD as PCP - General (Family Medicine) Early Osmond, MD as PCP - Cardiology (Cardiology) Brien Few, MD as Consulting Physician (Obstetrics and Gynecology) Lyndal Pulley, DO as Consulting Physician (Family Medicine)    Name of the patient: [name]  [id number]  1970-04-13   Date of visit: 12/23/2021   Chief Complaint/Reason for visit: constipation, leg swelling   Current Therapy: Cisplatin, bleomycin, Etoposide   Last treatment:  Day 3   Cycle 1 on 12/22/21   ASSESSMENT & PLAN: Patient is a 51 y.o. 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 Alvy Bimler.  I have viewed most recent oncology note and lab work.    #) malignant germ cell ovarian tumor  -Here for treatment today day 4 cycle 1 -Next appointment with oncologist is 12/28/21  #) Constipation -Resolved. Had BM this AM. -Abdominal exam today shows distention without tenderness or peritoneal signs -Advised to continue Miralax to keep BM regular. Patient knows to inform us if constipation returns.  #) Edema -Patient had good urine output after IV lasix yesterday during treatment.  -Exam today with less edema in BLE. Weight today same as yesterday. -Discussed plan with Dr. Alvy Bimler. Patient will get 20 mg IV lasix today after treatment. Will check BMP tomorrow to ensure no electrolyte imbalance.  #) Nausea and GERD -2/2 malignancy.  -Patient given IV pepcid and compazine in infusion.    Heme/Onc History: Oncology History  Malignant germ cell tumor of ovary ([place])  12/01/2021 Tumor Marker   CA-125 is elevated at 285, [place]-fet[place] [address], beta-hCG is elevated at 116   12/02/2021 Imaging   MRI pelvis  1. 21.1 by 10.9 by 18.7 cm abdominopelvic mass, with a satellite mass in the right upper quadrant anteriorly, and moderate ascites with tumor  deposits along the pelvic ascites posteriorly. The mass could be arising from right or left ovary, or both. The mass displaces the bowel and IVC, but there is no IVC thrombus or pelvic DVT identified. 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 2. Additional satellite mass above the liver is partially included, along with tumor deposits along the ascites. 3. Low-level edema along the lateral abdominal wall musculature. 4. Flattening of the IVC at the level of the aortic bifurcation due to the large abdominopelvic tumor. No findings of pelvic DVT.     12/07/2021 Imaging   CT abdomen and pelvis 1. Large central abdominal and pelvic mass consistent with known malignancy. This is incompletely visualized by this CT of the abdomen, but was seen on recent pelvic MRI. 2. 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 Right omental implant consistent with metastatic disease. Small amount of abdominal ascites. 3. No evidence for bowel or ureteral obstruction. 4. Aortic Atherosclerosis (ICD10-I70.0). 5. Chest findings dictated separately.   12/07/2021 Imaging   CT chest 1. Negative for pulmonary embolism. And no acute or metastatic process identified in the Chest. 2. Partially visible liver mass and ascites, staging CT Abdomen today is reported separately.     12/07/2021 Procedure   Successful placement of a right internal jugular approach power injectable Port-A-Cath. The catheter is ready for immediate use.   12/07/2021 Procedure   Ultrasound-guided biopsy of right upper quadrant mass.    12/08/2021 Initial Diagnosis   Ovarian  cancer ([place])   12/08/2021 Cancer Staging   Staging form: Ovary, Fallopian Tube, and Primary Peritoneal Carcinoma, AJCC 8th Edition - Clinical stage from 12/08/2021: FIGO Stage IIIC (cT3c, cN0, cM0) - Signed by Heath Lark, MD on 12/10/2021 Stage prefix:  Initial diagnosis   12/20/2021 -  Chemotherapy   Patient is on Treatment Plan : Ovarian germ cell tumor q21d x 3 Cycles         Interval history-: [name] is a 51 y.o. female with oncologic history as above seen in the infusion center today with chief complaint of constipation and edema.  Patient seen in infusion yesterday.  She tells me she increased her MiraLAX as directed and had a moderate-sized bowel movement today.  The stool was soft and brown in color.  She felt better after having the bowel movement.  She states after receiving IV Lasix yesterday she had good urine output and felt as if her leg swelling had decreased.  She states this morning she is having symptoms of GERD are eating peanut butter toast.  This is typical for her unfortunately.  She is also having some nausea.  No over-the-counter medications taken prior to arrival.  Patient denies any fever, chills, dysuria.      ROS  All other systems are reviewed and are negative for acute change except as noted in the HPI.    Allergies  Allergen Reactions   Latex Other (See Comments)    Sensitivity only   Sulfa Antibiotics Hives and Other (See Comments)   Sulfamethoxazole-Trimethoprim Rash   Septra [Bactrim] Rash     Past Medical History:  Diagnosis Date   Arthritis    Endometriosis    Family history of breast cancer in mother    at age 48   GAD (generalized anxiety disorder)    lexapro helped 2017 (Dr. Hoy Finlay did have extra stress of the Master's degree program on her at that time.  Got counseling.       GERD (gastroesophageal reflux disease)    Gestational diabetes    Kidney stones    Low back pain    Dr. Charlann Boxer.   Neck pain    and left shoulder pain: managed by osteopathic manipulation by Dr. Charlann Boxer. ?cervical radiculopathy   PAC (premature atrial contraction)      Past Surgical History:  Procedure Laterality Date   ABDOMINAL EXPLORATION SURGERY  2009   fulguration of  endometriosis - [facility] surgery   IR IMAGING GUIDED PORT INSERTION  12/08/2021   IR US GUIDE BX ASP/DRAIN  12/08/2021   SALPINGECTOMY Right 2009   TONSILLECTOMY     TUBAL LIGATION Left 2009    Social History   Socioeconomic History   Marital status: Married    Spouse name: Not on file   Number of children: 3   Years of education: Not on file   Highest education level: Not on file  Occupational History   Occupation: nurse    Employer: [employer]    Comment: at [employer] [facility]   Tobacco Use   Smoking status: Never   Smokeless tobacco: Never  Vaping Use   Vaping Use: Never used  Substance and Sexual Activity   Alcohol use: Yes    Comment: Occasionally   Drug use: No   Sexual activity: Yes    Partners: Male    Birth control/protection: Surgical  Other Topics Concern   Not on file  Social History Narrative   Married, 3 kids.  Lives in [place].   Educ: Masters deg nursing.   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   No tob/drugs.   Alc: social.   Social Determinants of Health   Financial Resource Strain: Not on file  Food Insecurity: No Food Insecurity (12/15/2021)   Hunger Vital Sign    Worried About Running Out of Food in the Last Year: Never true    Ran Out of Food in the Last Year: Never true  Transportation Needs: No Transportation Needs (12/15/2021)   PRAPARE - Hydrologist (Medical): No    Lack of Transportation (Non-Medical): No  Physical Activity: Not on file  Stress: Not on file  Social Connections: Not on file  Intimate Partner Violence: Not on file    Family History  Problem Relation Age of Onset   Rheum arthritis Mother    Stroke Mother    Hypertension Mother    Heart disease Mother    Arthritis Mother    Breast cancer Mother    Heart attack Mother 72   Diabetes Father    Heart disease Father    Hyperlipidemia Father    Hypertension Father     Heart attack Father 51   Heart disease Maternal Grandmother    Heart attack Maternal Grandmother 88   Arthritis Maternal Grandmother    Early death Maternal Grandfather        MVA   Diabetes Paternal Grandmother    Hypertension Paternal Grandmother    Heart disease Paternal Grandmother    Heart attack Paternal Grandmother 29   Heart disease Paternal Grandfather    Heart attack Paternal Grandfather 92   Coronary artery disease Other    Glaucoma Other    Non-Hodgkin's lymphoma Paternal Uncle      Current Outpatient Medications:    apixaban (ELIQUIS) 2.5 MG TABS tablet, Take 1 tablet (2.5 mg total) by mouth 2 (two) times daily. Wait to start this until after you have had your procedures, Disp: 60 tablet, Rfl: 6   dexamethasone (DECADRON) 4 MG tablet, Take 2 tablets (8 mg total) by mouth daily. Start the day after last cisplatin dose on day 6 of chemo for 3 days. Take with food., Disp: 30 tablet, Rfl: 1   famotidine (PEPCID) 10 MG tablet, Take 10 mg by mouth daily as needed for heartburn or indigestion., Disp: , Rfl:    furosemide (LASIX) 20 MG tablet, Take 1 tablet (20 mg total) by mouth daily for 5 days., Disp: 5 tablet, Rfl: 0   lidocaine-prilocaine (EMLA) cream, Apply to affected area once, Disp: 30 g, Rfl: 3   loratadine (CLARITIN) 10 MG tablet, Take 10 mg by mouth daily as needed for allergies., Disp: , Rfl:    LORazepam (ATIVAN) 0.5 MG tablet, Take 1 tablet (0.5 mg total) by mouth every 8 (eight) hours., Disp: 30 tablet, Rfl: 0   ondansetron (ZOFRAN) 8 MG tablet, Take 1 tablet (8 mg total) by mouth every 8 (eight) hours as needed for nausea or vomiting. Start on the third day after last cisplatin dose on day 8 of chemo, Disp: 30 tablet, Rfl: 1   prochlorperazine (COMPAZINE) 10 MG tablet, Take 1 tablet (10 mg total) by mouth every 6 (six) hours as needed for nausea or vomiting., Disp: 30 tablet, Rfl: 1 No current facility-administered medications for this visit.  Facility-Administered  Medications Ordered in Other Visits:  heparin lock flush 100 unit/mL, 500 Units, Intracatheter, Once PRN, Alvy Bimler, Ni, MD   sodium chloride flush (NS) 0.9 % injection 10 mL, 10 mL, Intracatheter, PRN, Alvy Bimler, Ni, MD  PHYSICAL EXAM: ECOG FS:1 - Symptomatic but completely ambulatory   T: 98.2    BP: 134/84    HR: 98   O2: 99% RA   Resp: 17   Wt: 133 lbs Physical Exam Vitals and nursing note reviewed.  Constitutional:      Appearance: She is well-developed. She is not ill-appearing or toxic-appearing.  HENT:     Head: Normocephalic.     Nose: Nose normal.  Eyes:     Conjunctiva/sclera: Conjunctivae normal.  Neck:     Vascular: No JVD.  Cardiovascular:     Rate and Rhythm: Normal rate and regular rhythm.     Pulses: Normal pulses.     Heart sounds: Normal heart sounds.  Pulmonary:     Effort: Pulmonary effort is normal.     Breath sounds: Normal breath sounds.  Abdominal:     General: Bowel sounds are normal. There is distension.  Musculoskeletal:     Cervical back: Normal range of motion.     Right lower leg: 1+ Edema present.     Left lower leg: 1+ Edema present.  Skin:    General: Skin is warm and dry.  Neurological:     Mental Status: She is oriented to person, place, and time.        LABORATORY DATA: I have reviewed the data as listed    Latest Ref Rng & Units 12/16/2021   11:47 AM 12/08/2021    8:37 AM 12/07/2021   11:23 AM  CBC  WBC 4.0 - 10.5 K/uL 15.0  16.4  19.3   Hemoglobin 12.0 - 15.0 g/dL 10.3  11.9  12.4   Hematocrit 36.0 - 46.0 % 31.2  36.3  37.9   Platelets 150 - 400 K/uL 568  584  610         Latest Ref Rng & Units 12/16/2021   11:47 AM 12/07/2021   11:23 AM 03/23/2011    3:50 PM  CMP  Glucose 70 - 99 mg/dL 98  126  88   BUN 6 - 20 mg/dL '9  9  14   '$ Creatinine 0.44 - 1.00 mg/dL 0.58  0.67  0.75   Sodium 135 - 145 mmol/L 138  137  138   Potassium 3.5 - 5.1 mmol/L 3.9  4.3  4.0   Chloride 98 - 111 mmol/L 105  101  103   CO2 22 - 32 mmol/L  '27  28  24   '$ Calcium 8.9 - 10.3 mg/dL 8.5  8.8  10.0   Total Protein 6.5 - 8.1 g/dL 6.2  6.6  7.0   Total Bilirubin 0.3 - 1.2 mg/dL 0.4  0.5  0.6   Alkaline Phos 38 - 126 U/L 48  50  35   AST 15 - 41 U/L '11  11  15   '$ ALT 0 - 44 U/L '13  10  11        '$ RADIOGRAPHIC STUDIES (from last 24 hours if applicable) I have personally reviewed the radiological images as listed and agreed with the findings in the report. No results found.      Visit Diagnosis: 1. Malignant germ cell tumor of ovary, unspecified laterality ([place])   2. Constipation, unspecified constipation type   3. Nausea without vomiting      No orders  of the defined types were placed in this encounter.   All questions were answered. The patient knows to call the clinic with any problems, questions or concerns. No barriers to learning was detected.  I have spent a total of 20 minutes minutes of face-to-face and non-face-to-face time, preparing to see the patient, obtaining and/or reviewing separately obtained history, performing a medically appropriate examination, counseling and educating the patient, documenting clinical information in the electronic health record, and care coordination (communications with other health care professionals or caregivers).    Thank you for allowing me to participate in the care of this patient.    Barrie Folk, PA-C Department of Hematology/Oncology [facility] Phone: [phone]  [phone]    12/23/2021 4:29 PM

## 2021-12-23 NOTE — Progress Notes (Signed)
BMP entered for 12/24/21 electrolyte recheck.

## 2021-12-24 ENCOUNTER — Inpatient Hospital Stay (HOSPITAL_BASED_OUTPATIENT_CLINIC_OR_DEPARTMENT_OTHER): Payer: 59

## 2021-12-24 ENCOUNTER — Encounter: Payer: Self-pay | Admitting: Gynecologic Oncology

## 2021-12-24 ENCOUNTER — Other Ambulatory Visit: Payer: Self-pay

## 2021-12-24 VITALS — BP 123/76 | HR 84 | Temp 98.2°F | Resp 18 | Ht 59.0 in | Wt 131.5 lb

## 2021-12-24 DIAGNOSIS — Z5111 Encounter for antineoplastic chemotherapy: Secondary | ICD-10-CM | POA: Diagnosis not present

## 2021-12-24 DIAGNOSIS — C569 Malignant neoplasm of unspecified ovary: Secondary | ICD-10-CM

## 2021-12-24 LAB — BASIC METABOLIC PANEL - CANCER CENTER ONLY
Anion gap: 9 (ref 5–15)
BUN: 16 mg/dL (ref 6–20)
CO2: 22 mmol/L (ref 22–32)
Calcium: 7.9 mg/dL — ABNORMAL LOW (ref 8.9–10.3)
Chloride: 106 mmol/L (ref 98–111)
Creatinine: 0.59 mg/dL (ref 0.44–1.00)
GFR, Estimated: >60 mL/min (ref >60)
Glucose, Bld: 120 mg/dL — ABNORMAL HIGH (ref 70–99)
Potassium: 3.9 mmol/L (ref 3.5–5.1)
Sodium: 137 mmol/L (ref 135–145)

## 2021-12-24 MED ORDER — SODIUM CHLORIDE 0.9 % IV SOLN: Freq: Once | INTRAVENOUS | Status: AC

## 2021-12-24 MED ORDER — SODIUM CHLORIDE 0.9 % IV SOLN
10.0000 mg | Freq: Once | INTRAVENOUS | Status: AC
  Administered 2021-12-24: 10 mg via INTRAVENOUS
  Filled 2021-12-24: qty 10

## 2021-12-24 MED ORDER — PROCHLORPERAZINE EDISYLATE 10 MG/2ML IJ SOLN
5.0000 mg | INTRAMUSCULAR | Status: AC
  Administered 2021-12-24: 5 mg via INTRAVENOUS
  Filled 2021-12-24: qty 2

## 2021-12-24 MED ORDER — SODIUM CHLORIDE 0.9 % IV SOLN
20.0000 mg/m2 | Freq: Once | INTRAVENOUS | Status: AC
  Administered 2021-12-24: 30 mg via INTRAVENOUS
  Filled 2021-12-24: qty 30

## 2021-12-24 MED ORDER — PALONOSETRON HCL INJECTION 0.25 MG/5ML
0.2500 mg | Freq: Once | INTRAVENOUS | Status: AC
  Administered 2021-12-24: 0.25 mg via INTRAVENOUS
  Filled 2021-12-24: qty 5

## 2021-12-24 MED ORDER — SODIUM CHLORIDE 0.9 % IV SOLN
100.0000 mg/m2 | Freq: Once | INTRAVENOUS | Status: AC
  Administered 2021-12-24: 150 mg via INTRAVENOUS
  Filled 2021-12-24: qty 7.5

## 2021-12-24 MED ORDER — SODIUM CHLORIDE 0.9 % IV SOLN
150.0000 mg | Freq: Once | INTRAVENOUS | Status: AC
  Administered 2021-12-24: 150 mg via INTRAVENOUS
  Filled 2021-12-24: qty 150

## 2021-12-24 MED ORDER — SODIUM CHLORIDE 0.9% FLUSH
10.0000 mL | INTRAVENOUS | Status: DC | PRN
  Administered 2021-12-24: 10 mL

## 2021-12-24 MED ORDER — HEPARIN SOD (PORK) LOCK FLUSH 100 UNIT/ML IV SOLN
500.0000 [IU] | Freq: Once | INTRAVENOUS | Status: AC | PRN
  Administered 2021-12-24: 500 [IU]

## 2021-12-24 MED ORDER — POTASSIUM CHLORIDE IN NACL 20-0.9 MEQ/L-% IV SOLN
Freq: Once | INTRAVENOUS | Status: AC
  Filled 2021-12-24: qty 1000

## 2021-12-24 MED ORDER — MAGNESIUM SULFATE 2 GM/50ML IV SOLN
2.0000 g | Freq: Once | INTRAVENOUS | Status: AC
  Administered 2021-12-24: 2 g via INTRAVENOUS
  Filled 2021-12-24: qty 50

## 2021-12-24 NOTE — Patient Instructions (Signed)
[facility]  Discharge Instructions: Thank you for choosing [facility] to provide your oncology and hematology care.   If you have a lab appointment with the [facility], please go directly to the [facility] and check in at the registration area.   Wear comfortable clothing and clothing appropriate for easy access to any Portacath or PICC line.   We strive to give you quality time with your provider. You may need to reschedule your appointment if you arrive late (15 or more minutes).  Arriving late affects you and other patients whose appointments are after yours.  Also, if you miss three or more appointments without notifying the office, you may be dismissed from the clinic at the provider's discretion.      For prescription refill requests, have your pharmacy contact our office and allow 72 hours for refills to be completed.    Today you received the following chemotherapy and/or immunotherapy agents: Cisplatin, Etoposide.       To help prevent nausea and vomiting after your treatment, we encourage you to take your nausea medication as directed.  BELOW ARE SYMPTOMS THAT SHOULD BE REPORTED IMMEDIATELY: *FEVER GREATER THAN 100.4 F (38 C) OR HIGHER *CHILLS OR SWEATING *NAUSEA AND VOMITING THAT IS NOT CONTROLLED WITH YOUR NAUSEA MEDICATION *UNUSUAL SHORTNESS OF BREATH *UNUSUAL BRUISING OR BLEEDING *URINARY PROBLEMS (pain or burning when urinating, or frequent urination) *BOWEL PROBLEMS (unusual diarrhea, constipation, pain near the anus) TENDERNESS IN MOUTH AND THROAT WITH OR WITHOUT PRESENCE OF ULCERS (sore throat, sores in mouth, or a toothache) UNUSUAL RASH, SWELLING OR PAIN  UNUSUAL VAGINAL DISCHARGE OR ITCHING   Items with * indicate a potential emergency and should be followed up as soon as possible or go to the Emergency Department if any problems should occur.  Please show the CHEMOTHERAPY ALERT CARD or IMMUNOTHERAPY ALERT CARD at  check-in to the Emergency Department and triage nurse.  Should you have questions after your visit or need to cancel or reschedule your appointment, please contact [facility]  Dept: [phone]  and follow the prompts.  Office hours are 8:00 a.m. to 4:30 p.m. Monday - Friday. Please note that voicemails left after 4:00 p.m. may not be returned until the following business day.  We are closed weekends and major holidays. You have access to a nurse at all times for urgent questions. Please call the main number to the clinic Dept: [phone] and follow the prompts.   For any non-urgent questions, you may also contact your provider using MyChart. We now offer e-Visits for anyone 28 and older to request care online for non-urgent symptoms. For details visit [email or web address].   Also download the MyChart app! Go to the app store, search "MyChart", open the app, select [facility], and log in with your MyChart username and password.  Masks are optional in the cancer centers. If you would like for your care team to wear a mask while they are taking care of you, please let them know. You may have one support person who is at least 50 years old accompany you for your appointments.

## 2021-12-28 ENCOUNTER — Encounter: Payer: Self-pay | Admitting: Hematology and Oncology

## 2021-12-28 ENCOUNTER — Other Ambulatory Visit: Payer: Self-pay

## 2021-12-28 ENCOUNTER — Telehealth: Payer: Self-pay

## 2021-12-28 ENCOUNTER — Encounter: Payer: Self-pay | Admitting: General Practice

## 2021-12-28 ENCOUNTER — Inpatient Hospital Stay: Payer: 59

## 2021-12-28 ENCOUNTER — Inpatient Hospital Stay (HOSPITAL_BASED_OUTPATIENT_CLINIC_OR_DEPARTMENT_OTHER): Payer: 59 | Admitting: Hematology and Oncology

## 2021-12-28 VITALS — BP 148/81 | HR 124 | Temp 98.4°F | Resp 18 | Ht 59.0 in | Wt 122.0 lb

## 2021-12-28 VITALS — HR 112

## 2021-12-28 DIAGNOSIS — C569 Malignant neoplasm of unspecified ovary: Secondary | ICD-10-CM

## 2021-12-28 DIAGNOSIS — R000 Tachycardia, unspecified: Secondary | ICD-10-CM

## 2021-12-28 DIAGNOSIS — Z5111 Encounter for antineoplastic chemotherapy: Secondary | ICD-10-CM | POA: Diagnosis not present

## 2021-12-28 LAB — CMP (CANCER CENTER ONLY)
ALT: 19 U/L (ref 0–44)
AST: 9 U/L — ABNORMAL LOW (ref 15–41)
Albumin: 3.2 g/dL — ABNORMAL LOW (ref 3.5–5.0)
Alkaline Phosphatase: 52 U/L (ref 38–126)
Anion gap: 6 (ref 5–15)
BUN: 9 mg/dL (ref 6–20)
CO2: 26 mmol/L (ref 22–32)
Calcium: 8.1 mg/dL — ABNORMAL LOW (ref 8.9–10.3)
Chloride: 102 mmol/L (ref 98–111)
Creatinine: 0.46 mg/dL (ref 0.44–1.00)
GFR, Estimated: >60 mL/min (ref >60)
Glucose, Bld: 106 mg/dL — ABNORMAL HIGH (ref 70–99)
Potassium: 3.7 mmol/L (ref 3.5–5.1)
Sodium: 134 mmol/L — ABNORMAL LOW (ref 135–145)
Total Bilirubin: 0.5 mg/dL (ref 0.3–1.2)
Total Protein: 6 g/dL — ABNORMAL LOW (ref 6.5–8.1)

## 2021-12-28 MED ORDER — SODIUM CHLORIDE 0.9 % IV SOLN: Freq: Once | INTRAVENOUS | Status: AC

## 2021-12-28 MED ORDER — SODIUM CHLORIDE 0.9% FLUSH
10.0000 mL | INTRAVENOUS | Status: DC | PRN
  Administered 2021-12-28: 10 mL

## 2021-12-28 MED ORDER — HEPARIN SOD (PORK) LOCK FLUSH 100 UNIT/ML IV SOLN
500.0000 [IU] | Freq: Once | INTRAVENOUS | Status: AC | PRN
  Administered 2021-12-28: 500 [IU]

## 2021-12-28 MED ORDER — PROCHLORPERAZINE MALEATE 10 MG PO TABS
10.0000 mg | ORAL_TABLET | Freq: Once | ORAL | Status: AC
  Administered 2021-12-28: 10 mg via ORAL
  Filled 2021-12-28: qty 1

## 2021-12-28 MED ORDER — SODIUM CHLORIDE 0.9 % IV SOLN
30.0000 [IU] | Freq: Once | INTRAVENOUS | Status: AC
  Administered 2021-12-28: 30 [IU] via INTRAVENOUS
  Filled 2021-12-28: qty 10

## 2021-12-28 MED ORDER — SODIUM CHLORIDE 0.9% FLUSH
10.0000 mL | Freq: Once | INTRAVENOUS | Status: AC
  Administered 2021-12-28: 10 mL

## 2021-12-28 NOTE — Assessment & Plan Note (Signed)
Overall, she tolerated treatment well She is struggling to keep herself adequately nourished with protein rich diet We discussed importance of aggressive supportive care I will add IV fluid support today and next week I am encouraged to see if she has frequent bowel movement since treatment, that could be a sign of positive response to therapy

## 2021-12-28 NOTE — Progress Notes (Signed)
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   All questions were answered. The patient knows to call the clinic with any problems, questions or concerns. The total time spent in the appointment was 20 minutes encounter with patients including review of chart and various tests results, discussions about plan of care and coordination of care plan   Heath Lark, MD 12/28/2021 12:39 PM  INTERVAL HISTORY: Please see below for problem oriented charting. [name] returns for treatment follow-up seen prior to day 9 of therapy [name] denies peripheral neuropathy [name] had frequent small bowel movement [name] struggled to take in adequate protein from her diet Denies nausea or other undesirable side effects from treatment so far  REVIEW OF SYSTEMS:   Constitutional: Denies fevers, chills or abnormal weight loss Eyes: Denies blurriness of vision Ears, nose, mouth, throat, and face: Denies mucositis or sore throat Respiratory: Denies cough, dyspnea or wheezes Cardiovascular: Denies palpitation, chest discomfort or lower extremity  swelling Gastrointestinal:  Denies nausea, heartburn or change in bowel habits Skin: Denies abnormal skin rashes Lymphatics: Denies new lymphadenopathy or easy bruising Neurological:Denies numbness, tingling or new weaknesses Behavioral/Psych: Mood is stable, no new changes  All other systems were reviewed with the patient and are negative.  I have reviewed the past medical history, past surgical history, social history and family history with the patient and they are unchanged from previous note.  ALLERGIES:  is allergic to latex, sulfa antibiotics, sulfamethoxazole-trimethoprim, and septra [bactrim].  MEDICATIONS:  Current Outpatient Medications  Medication Sig Dispense Refill   apixaban (ELIQUIS) 2.5 MG TABS tablet Take 1 tablet (2.5 mg total) by mouth 2 (two) times daily. Wait to start this until after you have had your procedures 60 tablet 6   dexamethasone (DECADRON) 4 MG tablet Take 2 tablets (8 mg total) by mouth daily. Start the day after last cisplatin dose on day 6 of chemo for 3 days. Take with food. 30 tablet 1   famotidine (PEPCID) 10 MG tablet Take 10 mg by mouth daily as needed for heartburn or indigestion.     furosemide (LASIX) 20 MG tablet Take 1 tablet (20 mg total) by mouth daily for 5 days. 5 tablet 0   lidocaine-prilocaine (EMLA) cream Apply to affected area once 30 g 3   loratadine (CLARITIN) 10 MG tablet Take 10 mg by mouth daily as needed for allergies.     LORazepam (ATIVAN) 0.5 MG tablet Take 1 tablet (0.5 mg total) by mouth every 8 (eight) hours. 30 tablet 0   ondansetron (ZOFRAN) 8 MG tablet Take 1 tablet (8 mg total) by mouth every 8 (eight) hours as needed for nausea or vomiting. Start on the  third day after last cisplatin dose on day 8 of chemo 30 tablet 1   prochlorperazine (COMPAZINE) 10 MG tablet Take 1 tablet (10 mg total) by mouth every 6 (six) hours as needed for nausea or vomiting. 30 tablet 1   No current facility-administered medications for this  visit.   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 21.1 by 10.9 by 18.7 cm abdominopelvic mass, with a satellite mass in the right upper quadrant anteriorly, and moderate ascites with tumor deposits along the pelvic ascites posteriorly. The mass could be arising from right or left ovary, or both. The mass displaces the bowel and IVC, but there is no IVC thrombus or pelvic DVT identified. 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 2. Additional satellite mass above the liver is partially included, along with tumor deposits along the ascites. 3. Low-level edema along the lateral abdominal wall musculature. 4. Flattening of the IVC at the level of the aortic bifurcation due to the large abdominopelvic tumor. No findings of pelvic DVT.     12/07/2021 Imaging   CT abdomen and pelvis 1. Large central abdominal and pelvic mass consistent with known malignancy. This is incompletely visualized by this CT of the abdomen, but was seen on recent pelvic MRI. 2. 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 Right omental implant consistent with metastatic disease. Small amount of abdominal ascites. 3. No evidence for bowel or ureteral obstruction. 4. Aortic Atherosclerosis (ICD10-I70.0). 5. Chest findings dictated separately.   12/07/2021 Imaging   CT chest 1. Negative for pulmonary  embolism. And no acute or metastatic process identified in the Chest. 2. Partially visible liver mass and ascites, staging CT Abdomen today is reported separately.     12/07/2021 Procedure   Successful placement of a right internal jugular approach power injectable Port-A-Cath. The catheter is ready for immediate use.   12/07/2021 Procedure   Ultrasound-guided biopsy of right upper quadrant mass.    12/08/2021 Initial Diagnosis   Ovarian cancer ([place])   12/08/2021 Cancer Staging   Staging form: Ovary, Fallopian Tube, and Primary Peritoneal Carcinoma, AJCC 8th Edition - Clinical stage from 12/08/2021: FIGO Stage IIIC (cT3c, cN0, cM0) - Signed by Heath Lark, MD on 12/10/2021 Stage prefix: Initial diagnosis   12/20/2021 -  Chemotherapy   Patient is on Treatment Plan : Ovarian germ cell tumor q21d x 3 Cycles       PHYSICAL EXAMINATION: ECOG PERFORMANCE STATUS: 1 - Symptomatic but completely ambulatory  Vitals:   12/28/21 1108  BP: (!) 148/81  Pulse: (!) 124  Resp: 18  Temp: 98.4 F (36.9 C)  SpO2: 100%   Filed Weights   12/28/21 1108  Weight: 122 lb (55.3 kg)    GENERAL:alert, no distress and comfortable SKIN: skin color, texture, turgor are normal, no rashes or significant lesions EYES: normal, Conjunctiva are pink  and non-injected, sclera clear OROPHARYNX:no exudate, no erythema and lips, buccal mucosa, and tongue normal  NECK: supple, thyroid normal size, non-tender, without nodularity LYMPH:  no palpable lymphadenopathy in the cervical, axillary or inguinal LUNGS: clear to auscultation and percussion with normal breathing effort HEART: Tachycardia, no murmurs and no lower extremity edema ABDOMEN:abdomen soft, non-tender, distended with disease, no fluid shift Musculoskeletal:no cyanosis of digits and no clubbing  NEURO: alert & oriented x 3 with fluent speech, no focal motor/sensory deficits  LABORATORY DATA:  I have reviewed the data as listed    Component  Value Date/Time   NA 134 (L) 12/28/2021 1037   K 3.7 12/28/2021 1037   CL 102 12/28/2021 1037   CO2 26 12/28/2021 1037   GLUCOSE 106 (H) 12/28/2021 1037   BUN 9 12/28/2021 1037   CREATININE 0.46 12/28/2021 1037   CREATININE 0.75 03/23/2011 1550   CALCIUM 8.1 (L) 12/28/2021 1037   PROT 6.0 (L) 12/28/2021 1037   ALBUMIN 3.2 (L) 12/28/2021 1037   AST 9 (L) 12/28/2021 1037   ALT 19 12/28/2021 1037   ALKPHOS 52 12/28/2021 1037   BILITOT 0.5 12/28/2021 1037   GFRNONAA >60 12/28/2021 1037    No results found for: "SPEP", "UPEP"  Lab Results  Component Value Date   WBC 15.0 (H) 12/16/2021   NEUTROABS 12.3 (H) 12/16/2021   HGB 10.3 (L) 12/16/2021   HCT 31.2 (L) 12/16/2021   MCV 91.2 12/16/2021   PLT 568 (H) 12/16/2021      Chemistry      Component Value Date/Time   NA 134 (L) 12/28/2021 1037   K 3.7 12/28/2021 1037   CL 102 12/28/2021 1037   CO2 26 12/28/2021 1037   BUN 9 12/28/2021 1037   CREATININE 0.46 12/28/2021 1037   CREATININE 0.75 03/23/2011 1550      Component Value Date/Time   CALCIUM 8.1 (L) 12/28/2021 1037   ALKPHOS 52 12/28/2021 1037   AST 9 (L) 12/28/2021 1037   ALT 19 12/28/2021 1037   BILITOT 0.5 12/28/2021 1037

## 2021-12-28 NOTE — Progress Notes (Signed)
[facility] Spiritual Care Note  Followed up with [name] and her dad in infusion, providing pastoral presence, reflective listening, and normalization of feelings as she shared and processed updates. In particular, low energy and frequent urination are making it difficult to leave the house. [name] is also struggling with the current identity shift from both personal and professional caregiver (parent, spouse, nurse) to care receiver while she feels sick and fatigued.  Will continue to follow for spiritual and emotional support as desired throughout her treatment experience.   [place], [place], [facility] Pager [phone] Voicemail [phone]

## 2021-12-28 NOTE — Telephone Encounter (Signed)
Returned her call. She just got home from treatment and starting having chills. Temperature 100.1. Instructed to take tylenol and the office will call her back. She verbalized understanding.

## 2021-12-28 NOTE — Patient Instructions (Signed)
[facility] ONCOLOGY  Discharge Instructions: Thank you for choosing [facility] to provide your oncology and hematology care.   If you have a lab appointment with the [facility], please go directly to the [facility] and check in at the registration area.   Wear comfortable clothing and clothing appropriate for easy access to any Portacath or PICC line.   We strive to give you quality time with your provider. You may need to reschedule your appointment if you arrive late (15 or more minutes).  Arriving late affects you and other patients whose appointments are after yours.  Also, if you miss three or more appointments without notifying the office, you may be dismissed from the clinic at the provider's discretion.      For prescription refill requests, have your pharmacy contact our office and allow 72 hours for refills to be completed.    Today you received the following chemotherapy and/or immunotherapy agents; Bleomycin      To help prevent nausea and vomiting after your treatment, we encourage you to take your nausea medication as directed.  BELOW ARE SYMPTOMS THAT SHOULD BE REPORTED IMMEDIATELY: *FEVER GREATER THAN 100.4 F (38 C) OR HIGHER *CHILLS OR SWEATING *NAUSEA AND VOMITING THAT IS NOT CONTROLLED WITH YOUR NAUSEA MEDICATION *UNUSUAL SHORTNESS OF BREATH *UNUSUAL BRUISING OR BLEEDING *URINARY PROBLEMS (pain or burning when urinating, or frequent urination) *BOWEL PROBLEMS (unusual diarrhea, constipation, pain near the anus) TENDERNESS IN MOUTH AND THROAT WITH OR WITHOUT PRESENCE OF ULCERS (sore throat, sores in mouth, or a toothache) UNUSUAL RASH, SWELLING OR PAIN  UNUSUAL VAGINAL DISCHARGE OR ITCHING   Items with * indicate a potential emergency and should be followed up as soon as possible or go to the Emergency Department if any problems should occur.  Please show the CHEMOTHERAPY ALERT CARD or IMMUNOTHERAPY ALERT CARD at check-in to  the Emergency Department and triage nurse.  Should you have questions after your visit or need to cancel or reschedule your appointment, please contact [facility]  Dept: [phone]  and follow the prompts.  Office hours are 8:00 a.m. to 4:30 p.m. Monday - Friday. Please note that voicemails left after 4:00 p.m. may not be returned until the following business day.  We are closed weekends and major holidays. You have access to a nurse at all times for urgent questions. Please call the main number to the clinic Dept: [phone] and follow the prompts.   For any non-urgent questions, you may also contact your provider using MyChart. We now offer e-Visits for anyone 34 and older to request care online for non-urgent symptoms. For details visit [email or web address].   Also download the MyChart app! Go to the app store, search "MyChart", open the app, select [facility], and log in with your MyChart username and password.  Masks are optional in the cancer centers. If you would like for your care team to wear a mask while they are taking care of you, please let them know. You may have one support [name] who is at least 50 years old accompany you for your appointments.

## 2021-12-28 NOTE — Progress Notes (Signed)
Per Dr. Gorsuch, okay to treat with elevated heart rate 

## 2021-12-28 NOTE — Telephone Encounter (Signed)
Called back and told her per Dr. Alvy Bimler, the fever could be from mucositis. Instructed to gargle with warm salt water and maintain good oral hygiene. Use listerine prn. Offered appt with [facility]. She declined and will call the office back if needed. Instructed to go to ER for worsening symptoms. She verbalized understanding.

## 2021-12-28 NOTE — Assessment & Plan Note (Signed)
This could be a sign of dehydration I recommend addition of IV fluids She has appointment for echocardiogram tomorrow

## 2021-12-29 ENCOUNTER — Other Ambulatory Visit: Payer: Self-pay

## 2021-12-29 ENCOUNTER — Telehealth: Payer: Self-pay | Admitting: *Deleted

## 2021-12-29 ENCOUNTER — Ambulatory Visit (INDEPENDENT_AMBULATORY_CARE_PROVIDER_SITE_OTHER): Payer: 59

## 2021-12-29 ENCOUNTER — Other Ambulatory Visit: Payer: Self-pay | Admitting: *Deleted

## 2021-12-29 DIAGNOSIS — R000 Tachycardia, unspecified: Secondary | ICD-10-CM

## 2021-12-29 LAB — ECHOCARDIOGRAM COMPLETE
Area-P 1/2: 4.06 cm2
S' Lateral: 2.14 cm

## 2021-12-29 NOTE — Telephone Encounter (Signed)
Patient called. After yesterday's elevated temp, she called office with update. Began having chills again this morning. Temp 99.0 originally. She did not take tylenol at that time. She is using salt gargle. Pt called at 1230. Temp 100.7. States she feels sleepy. Denies other symptoms. She asks if she should go ahead and take tylenol now and not wait for it to get higher? Also wants to know if she should keep appt this afternoon at 2p for ECHO?   Dr. Alvy Bimler informed  -- Patient should take tylenol, continue gargling and in absence of other symptoms, keep appt for Echo.   Contacted patient 1245 with instructions per Dr. Alvy Bimler and asked her to call office if temp does not respond to tylenol, or if further symptoms develop.  She said she will let office know.

## 2021-12-29 NOTE — Progress Notes (Signed)
The proposed treatment discussed in conference is for discussion purpose only and is not a binding recommendation.  The patients have not been physically examined, or presented with their treatment options.  Therefore, final treatment plans cannot be decided.  

## 2021-12-30 ENCOUNTER — Encounter: Payer: Self-pay | Admitting: Hematology and Oncology

## 2021-12-30 ENCOUNTER — Ambulatory Visit (HOSPITAL_COMMUNITY)
Admission: RE | Admit: 2021-12-30 | Discharge: 2021-12-30 | Disposition: A | Discharge status: Home / Self Care | Payer: 59 | Source: Ambulatory Visit | Attending: Hematology and Oncology | Admitting: Hematology and Oncology

## 2021-12-30 ENCOUNTER — Telehealth: Payer: Self-pay

## 2021-12-30 ENCOUNTER — Other Ambulatory Visit (HOSPITAL_COMMUNITY): Payer: Self-pay

## 2021-12-30 ENCOUNTER — Other Ambulatory Visit: Payer: Self-pay | Admitting: Hematology and Oncology

## 2021-12-30 ENCOUNTER — Inpatient Hospital Stay (HOSPITAL_BASED_OUTPATIENT_CLINIC_OR_DEPARTMENT_OTHER): Payer: 59 | Admitting: Hematology and Oncology

## 2021-12-30 ENCOUNTER — Inpatient Hospital Stay: Payer: 59

## 2021-12-30 VITALS — BP 123/79 | HR 128 | Temp 99.6°F | Resp 18 | Ht 59.0 in | Wt 120.6 lb

## 2021-12-30 DIAGNOSIS — R5081 Fever presenting with conditions classified elsewhere: Secondary | ICD-10-CM

## 2021-12-30 DIAGNOSIS — D61818 Other pancytopenia: Secondary | ICD-10-CM | POA: Insufficient documentation

## 2021-12-30 DIAGNOSIS — D709 Neutropenia, unspecified: Secondary | ICD-10-CM | POA: Diagnosis not present

## 2021-12-30 DIAGNOSIS — C569 Malignant neoplasm of unspecified ovary: Secondary | ICD-10-CM

## 2021-12-30 DIAGNOSIS — R000 Tachycardia, unspecified: Secondary | ICD-10-CM | POA: Diagnosis not present

## 2021-12-30 DIAGNOSIS — Z5111 Encounter for antineoplastic chemotherapy: Secondary | ICD-10-CM | POA: Diagnosis not present

## 2021-12-30 DIAGNOSIS — R509 Fever, unspecified: Secondary | ICD-10-CM | POA: Insufficient documentation

## 2021-12-30 LAB — CMP (CANCER CENTER ONLY)
ALT: 16 U/L (ref 0–44)
AST: 10 U/L — ABNORMAL LOW (ref 15–41)
Albumin: 3.2 g/dL — ABNORMAL LOW (ref 3.5–5.0)
Alkaline Phosphatase: 100 U/L (ref 38–126)
Anion gap: 11 (ref 5–15)
BUN: 9 mg/dL (ref 6–20)
CO2: 24 mmol/L (ref 22–32)
Calcium: 8.4 mg/dL — ABNORMAL LOW (ref 8.9–10.3)
Chloride: 101 mmol/L (ref 98–111)
Creatinine: 0.52 mg/dL (ref 0.44–1.00)
GFR, Estimated: >60 mL/min (ref >60)
Glucose, Bld: 134 mg/dL — ABNORMAL HIGH (ref 70–99)
Potassium: 3.4 mmol/L — ABNORMAL LOW (ref 3.5–5.1)
Sodium: 136 mmol/L (ref 135–145)
Total Bilirubin: 0.6 mg/dL (ref 0.3–1.2)
Total Protein: 6.4 g/dL — ABNORMAL LOW (ref 6.5–8.1)

## 2021-12-30 LAB — URINALYSIS, COMPLETE (UACMP) WITH MICROSCOPIC
Bilirubin Urine: NEGATIVE
Glucose, UA: NEGATIVE mg/dL
Ketones, ur: NEGATIVE mg/dL
Leukocytes,Ua: NEGATIVE
Nitrite: NEGATIVE
Protein, ur: NEGATIVE mg/dL
Specific Gravity, Urine: 1.01 (ref 1.005–1.030)
pH: 6 (ref 5.0–8.0)

## 2021-12-30 LAB — CBC WITH DIFFERENTIAL (CANCER CENTER ONLY)
Abs Immature Granulocytes: 0.01 10*3/uL (ref 0.00–0.07)
Basophils Absolute: 0 10*3/uL (ref 0.0–0.1)
Basophils Relative: 0 %
Eosinophils Absolute: 0 10*3/uL (ref 0.0–0.5)
Eosinophils Relative: 0 %
HCT: 24.2 % — ABNORMAL LOW (ref 36.0–46.0)
Hemoglobin: 7.9 g/dL — ABNORMAL LOW (ref 12.0–15.0)
Immature Granulocytes: 2 %
Lymphocytes Relative: 68 %
Lymphs Abs: 0.3 10*3/uL — ABNORMAL LOW (ref 0.7–4.0)
MCH: 28.3 pg (ref 26.0–34.0)
MCHC: 32.6 g/dL (ref 30.0–36.0)
MCV: 86.7 fL (ref 80.0–100.0)
Monocytes Absolute: 0 10*3/uL — ABNORMAL LOW (ref 0.1–1.0)
Monocytes Relative: 2 %
Neutro Abs: 0.1 10*3/uL — CL (ref 1.7–7.7)
Neutrophils Relative %: 28 %
Platelet Count: 131 10*3/uL — ABNORMAL LOW (ref 150–400)
RBC: 2.79 MIL/uL — ABNORMAL LOW (ref 3.87–5.11)
RDW: 13.8 % (ref 11.5–15.5)
Smear Review: NORMAL
WBC Count: 0.5 10*3/uL — CL (ref 4.0–10.5)
nRBC: 0 % (ref 0.0–0.2)

## 2021-12-30 LAB — ABO/RH: ABO/RH(D): B POS

## 2021-12-30 LAB — SAMPLE TO BLOOD BANK

## 2021-12-30 MED ORDER — AMOXICILLIN-POT CLAVULANATE 875-125 MG PO TABS
1.0000 | ORAL_TABLET | Freq: Two times a day (BID) | ORAL | 0 refills | Status: DC
  Filled 2021-12-30: qty 14, 7d supply, fill #0

## 2021-12-30 NOTE — Telephone Encounter (Signed)
Returned her call. She had a temp of 100 last night at 8:30 pm, she took tylenol and went to bed. This am temp is 99. Told her Dr. Alvy Bimler, she would be concerned for fever 101 plus cough or sore throat. Tumor response to chemo can also cause elevated temp. She denies cough and sore throat. Will continue salt water rinses and call the office back for questions/ concerns.  She has a couple episodes of ringing in ears that lasted for a few seconds. Not uncomfortable. Dr. Alvy Bimler given info.

## 2021-12-30 NOTE — Assessment & Plan Note (Signed)
This is likely due to malignancy and anemia Her echocardiogram is within normal range

## 2021-12-30 NOTE — Progress Notes (Signed)
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[facility]) From treatment perspective, she has developed severe pancytopenia The cause of her fever is unknown Tumor fever or neutropenic fever from infection cannot be excluded although she does not have any localizing signs apart from sinus congestion Continue supportive care  Neutropenic fever ([place]) She has developed neutropenic fever She has no localizing signs apart from sinus congestion We discussed the risk and benefits of admission versus outpatient management with oral antibiotics Chest x-ray is unremarkable Urinalysis is unremarkable Urine culture is pending Blood cultures pending Ultimately, the patient elected for outpatient management I will prescribe a course of Augmentin I will reassess tomorrow  Pancytopenia, acquired [facility]) She has acquired pancytopenia due to treatment Her tachycardia could be due to symptomatic anemia We discussed the risk and benefits of blood transfusion support We will get ABO Rh and blood type performed today I plan to recheck her blood again tomorrow If her blood count drops further tomorrow, we will arrange for a unit of blood transfusion  Tachycardia This is likely due to malignancy and anemia Her echocardiogram is within normal range  Orders Placed This Encounter  Procedures   CBC with Differential ([place] Only)    Standing Status:   Future    Standing Expiration Date:   12/31/2022   Sample to Blood Bank    Standing Status:   Future    Number of Occurrences:   1    Standing Expiration Date:   12/31/2022   ABO/RH    Standing Status:   Future    Number of  Occurrences:   1    Standing Expiration Date:   12/30/2022    All questions were answered. The patient knows to call the clinic with any problems, questions or concerns. The total time spent in the appointment was 40 minutes encounter with patients including review of chart and various tests results, discussions about plan of care and coordination of care plan   Heath Lark, MD 12/30/2021 3:40 PM  INTERVAL HISTORY: Please see below for problem oriented charting. she returns for urgent evaluation due to recurrent fever This morning around 11:00, she has a temperature of 101 She is here accompanied by her father She has some minor sinus congestion with mild bloody drainage She denies mucositis, cough, sore throat, diarrhea, dysuria, frequency or urinary urgency She appears anxious The patient denies any recent signs or symptoms of bleeding such as spontaneous hematuria or hematochezia.  REVIEW OF SYSTEMS:   Eyes: Denies blurriness of vision Ears, nose, mouth, throat, and face: Denies mucositis or sore throat Respiratory: Denies cough, dyspnea or wheezes Cardiovascular: Denies palpitation, chest discomfort or lower extremity swelling Gastrointestinal:  Denies nausea, heartburn or change in bowel habits Skin: Denies abnormal skin rashes Lymphatics: Denies new lymphadenopathy or easy bruising Neurological:Denies numbness, tingling or new weaknesses Behavioral/Psych: Mood is stable, no new changes  All other systems were reviewed with the patient and are negative.  I have reviewed the past medical history, past surgical history, social history and family history with the patient and they are unchanged from previous note.  ALLERGIES:  is allergic to latex, sulfa antibiotics, sulfamethoxazole-trimethoprim, and septra [bactrim].  MEDICATIONS:  Current  Outpatient Medications  Medication Sig Dispense Refill   amoxicillin-clavulanate (AUGMENTIN) 875-125 MG tablet Take 1 tablet by mouth 2  (two) times daily. 14 tablet 0   apixaban (ELIQUIS) 2.5 MG TABS tablet Take 1 tablet (2.5 mg total) by mouth 2 (two) times daily. Wait to start this until after you have had your procedures 60 tablet 6   dexamethasone (DECADRON) 4 MG tablet Take 2 tablets (8 mg total) by mouth daily. Start the day after last cisplatin dose on day 6 of chemo for 3 days. Take with food. 30 tablet 1   famotidine (PEPCID) 10 MG tablet Take 10 mg by mouth daily as needed for heartburn or indigestion.     furosemide (LASIX) 20 MG tablet Take 1 tablet (20 mg total) by mouth daily for 5 days. 5 tablet 0   lidocaine-prilocaine (EMLA) cream Apply to affected area once 30 g 3   loratadine (CLARITIN) 10 MG tablet Take 10 mg by mouth daily as needed for allergies.     LORazepam (ATIVAN) 0.5 MG tablet Take 1 tablet (0.5 mg total) by mouth every 8 (eight) hours. 30 tablet 0   ondansetron (ZOFRAN) 8 MG tablet Take 1 tablet (8 mg total) by mouth every 8 (eight) hours as needed for nausea or vomiting. Start on the third day after last cisplatin dose on day 8 of chemo 30 tablet 1   prochlorperazine (COMPAZINE) 10 MG tablet Take 1 tablet (10 mg total) by mouth every 6 (six) hours as needed for nausea or vomiting. 30 tablet 1   No current facility-administered medications for this visit.    SUMMARY OF ONCOLOGIC HISTORY: Oncology History  Malignant germ cell tumor of ovary ([place])  12/01/2021 Tumor Marker   CA-125 is elevated at 285, alpha-fet[place] [address], beta-hCG is elevated at 116   12/02/2021 Imaging   MRI pelvis  1. 21.1 by 10.9 by 18.7 cm abdominopelvic mass, with a satellite mass in the right upper quadrant anteriorly, and moderate ascites with tumor deposits along the pelvic ascites posteriorly. The mass could be arising from right or left ovary, or both. The mass displaces the bowel and IVC, but there is no IVC thrombus or pelvic DVT identified. Appearance strongly favors ovarian malignancy with peritoneal  spread of tumor. 2. Additional satellite mass above the liver is partially included, along with tumor deposits along the ascites. 3. Low-level edema along the lateral abdominal wall musculature. 4. Flattening of the IVC at the level of the aortic bifurcation due to the large abdominopelvic tumor. No findings of pelvic DVT.     12/07/2021 Imaging   CT abdomen and pelvis 1. Large central abdominal and pelvic mass consistent with known malignancy. This is incompletely visualized by this CT of the abdomen, but was seen on recent pelvic MRI. 2. 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 Right omental implant consistent with metastatic disease. Small amount of abdominal ascites. 3. No evidence for bowel or ureteral obstruction. 4. Aortic Atherosclerosis (ICD10-I70.0). 5. Chest findings dictated separately.   12/07/2021 Imaging   CT chest 1. Negative for pulmonary embolism. And no acute or metastatic process identified in the Chest. 2. Partially visible liver mass and ascites, staging CT Abdomen today is reported separately.     12/07/2021 Procedure   Successful placement of a right internal jugular approach power injectable Port-A-Cath. The catheter is ready for immediate use.   12/07/2021 Procedure   Ultrasound-guided biopsy of  right upper quadrant mass.    12/08/2021 Initial Diagnosis   Ovarian cancer ([place])   12/08/2021 Cancer Staging   Staging form: Ovary, Fallopian Tube, and Primary Peritoneal Carcinoma, AJCC 8th Edition - Clinical stage from 12/08/2021: FIGO Stage IIIC (cT3c, cN0, cM0) - Signed by Heath Lark, MD on 12/10/2021 Stage prefix: Initial diagnosis   12/20/2021 -  Chemotherapy   Patient is on Treatment Plan : Ovarian germ cell tumor q21d x 3 Cycles       PHYSICAL EXAMINATION: ECOG PERFORMANCE STATUS: 1 - Symptomatic but completely ambulatory  Vitals:   12/30/21 1453  BP: 123/79   Pulse: (!) 128  Resp: 18  Temp: 99.6 F (37.6 C)  SpO2: 100%   Filed Weights   12/30/21 1453  Weight: 120 lb 9.6 oz (54.7 kg)    GENERAL:alert, no distress and comfortable.  She looks pale SKIN: skin color, texture, turgor are normal, no rashes or significant lesions EYES: normal, Conjunctiva are pink and non-injected, sclera clear OROPHARYNX:no exudate, no erythema and lips, buccal mucosa, and tongue normal  NECK: supple, thyroid normal size, non-tender, without nodularity LYMPH:  no palpable lymphadenopathy in the cervical, axillary or inguinal LUNGS: clear to auscultation and percussion with normal breathing effort HEART: Tachycardia, no murmurs and no lower extremity edema ABDOMEN:abdomen soft, non-tender and normal bowel sounds.  It is distended with malignancy.  I do not appreciate much ascitic fluid Musculoskeletal:no cyanosis of digits and no clubbing  NEURO: alert & oriented x 3 with fluent speech, no focal motor/sensory deficits  LABORATORY DATA:  I have reviewed the data as listed    Component Value Date/Time   NA 136 12/30/2021 1443   K 3.4 (L) 12/30/2021 1443   CL 101 12/30/2021 1443   CO2 24 12/30/2021 1443   GLUCOSE 134 (H) 12/30/2021 1443   BUN 9 12/30/2021 1443   CREATININE 0.52 12/30/2021 1443   CREATININE 0.75 03/23/2011 1550   CALCIUM 8.4 (L) 12/30/2021 1443   PROT 6.4 (L) 12/30/2021 1443   ALBUMIN 3.2 (L) 12/30/2021 1443   AST 10 (L) 12/30/2021 1443   ALT 16 12/30/2021 1443   ALKPHOS 100 12/30/2021 1443   BILITOT 0.6 12/30/2021 1443   GFRNONAA >60 12/30/2021 1443    No results found for: "SPEP", "UPEP"  Lab Results  Component Value Date   WBC 0.5 (LL) 12/30/2021   NEUTROABS 0.1 (LL) 12/30/2021   HGB 7.9 (L) 12/30/2021   HCT 24.2 (L) 12/30/2021   MCV 86.7 12/30/2021   PLT 131 (L) 12/30/2021      Chemistry      Component Value Date/Time   NA 136 12/30/2021 1443   K 3.4 (L) 12/30/2021 1443   CL 101 12/30/2021 1443   CO2 24 12/30/2021  1443   BUN 9 12/30/2021 1443   CREATININE 0.52 12/30/2021 1443   CREATININE 0.75 03/23/2011 1550      Component Value Date/Time   CALCIUM 8.4 (L) 12/30/2021 1443   ALKPHOS 100 12/30/2021 1443   AST 10 (L) 12/30/2021 1443   ALT 16 12/30/2021 1443   BILITOT 0.6 12/30/2021 1443       RADIOGRAPHIC STUDIES: I have personally reviewed the radiological images as listed and agreed with the findings in the report. DG Chest 2 View  Result Date: 12/30/2021 CLINICAL DATA:  Fever EXAM: CHEST - 2 VIEW COMPARISON:  CT chest done on 12/07/2021 FINDINGS: Cardiac size is within normal limits. There are no signs of pulmonary edema or focal pulmonary consolidation. There is  no pleural effusion or pneumothorax. Tip of right IJ chest port is seen in superior vena cava. IMPRESSION: No active cardiopulmonary disease. Electronically Signed   By: Elmer Picker M.D.   On: 12/30/2021 13:54   ECHOCARDIOGRAM COMPLETE  Result Date: 12/29/2021    ECHOCARDIOGRAM REPORT   Patient Name:   DONNALYN JURAN Date of Exam: 12/29/2021 Medical Rec #:  [id number]      Height:       59.0 in Accession #:    [id number]     Weight:       122.0 lb Date of Birth:  02-07-1971      BSA:          1.495 m Patient Age:    51 years       BP:           100/60 mmHg Patient Gender: F              HR:           107 bpm. Exam Location:  Outpatient Procedure: 2D Echo, 3D Echo, Color Doppler, Cardiac Doppler and Strain Analysis Indications:    R00.0 Tachycardia  History:        Patient has no prior history of Echocardiogram examinations.                 Risk Factors:Non-Smoker. Malignant germ cell tumor of ovary;                 possibility of impingement of the vena cava by her abdominal                 mass.  Sonographer:    Leavy Cella RDCS Referring Phys: [id number] [place]  1. Left ventricular ejection fraction, by estimation, is 55 to 60%. The left ventricle has normal function. The left ventricle has no regional wall  motion abnormalities. Indeterminate diastolic filling due to E-A fusion.  2. Right ventricular systolic function is normal. The right ventricular size is normal. Tricuspid regurgitation signal is inadequate for assessing PA pressure.  3. The mitral valve is grossly normal. No evidence of mitral valve regurgitation. No evidence of mitral stenosis.  4. The aortic valve is tricuspid. Aortic valve regurgitation is not visualized. No aortic stenosis is present.  5. The inferior vena cava is normal in size with greater than 50% respiratory variability, suggesting right atrial pressure of 3 mmHg. Conclusion(s)/Recommendation(s): Normal biventricular function without evidence of hemodynamically significant valvular heart disease. FINDINGS  Left Ventricle: Left ventricular ejection fraction, by estimation, is 55 to 60%. The left ventricle has normal function. The left ventricle has no regional wall motion abnormalities. Global longitudinal strain performed but not reported based on interpreter judgement due to suboptimal tracking. 3D left ventricular ejection fraction analysis performed but not reported based on interpreter judgement due to suboptimal tracking. The left ventricular internal cavity size was normal in size. There is no left ventricular hypertrophy. Indeterminate diastolic filling due to E-A fusion. Right Ventricle: The right ventricular size is normal. No increase in right ventricular wall thickness. Right ventricular systolic function is normal. Tricuspid regurgitation signal is inadequate for assessing PA pressure. Left Atrium: Left atrial size was normal in size. Right Atrium: Right atrial size was normal in size. Pericardium: There is no evidence of pericardial effusion. Mitral Valve: The mitral valve is grossly normal. No evidence of mitral valve regurgitation. No evidence of mitral valve stenosis. Tricuspid Valve: The tricuspid valve is grossly normal. Tricuspid valve regurgitation  is not demonstrated.  No evidence of tricuspid stenosis. Aortic Valve: The aortic valve is tricuspid. Aortic valve regurgitation is not visualized. No aortic stenosis is present. Pulmonic Valve: The pulmonic valve was grossly normal. Pulmonic valve regurgitation is not visualized. No evidence of pulmonic stenosis. Aorta: The aortic root and ascending aorta are structurally normal, with no evidence of dilitation. Venous: The inferior vena cava is normal in size with greater than 50% respiratory variability, suggesting right atrial pressure of 3 mmHg. IAS/Shunts: The atrial septum is grossly normal.  LEFT VENTRICLE PLAX 2D LVIDd:         3.95 cm   Diastology LVIDs:         2.14 cm   LV e' medial:    11.20 cm/s LV PW:         0.92 cm   LV E/e' medial:  6.1 LV IVS:        0.74 cm   LV e' lateral:   13.80 cm/s LVOT diam:     2.00 cm   LV E/e' lateral: 5.0 LV SV:         41 LV SV Index:   28 LVOT Area:     3.14 cm                           3D Volume EF:                          3D EF:        50 %                          LV EDV:       191 ml                          LV ESV:       96 ml                          LV SV:        95 ml RIGHT VENTRICLE RV Basal diam:  2.72 cm RV Mid diam:    2.27 cm RV S prime:     18.60 cm/s TAPSE (M-mode): 1.9 cm LEFT ATRIUM           Index        RIGHT ATRIUM          Index LA diam:      2.80 cm 1.87 cm/m   RA Area:     8.16 cm LA Vol (A2C): 8.1 ml  5.44 ml/m   RA Volume:   15.40 ml 10.30 ml/m LA Vol (A4C): 24.4 ml 16.32 ml/m  AORTIC VALVE LVOT Vmax:   95.00 cm/s LVOT Vmean:  59.800 cm/s LVOT VTI:    0.132 m  AORTA Ao Root diam: 2.40 cm Ao Asc diam:  2.40 cm MITRAL VALVE MV Area (PHT): 4.06 cm    SHUNTS MV Decel Time: 187 msec    Systemic VTI:  0.13 m MV E velocity: 68.60 cm/s  Systemic Diam: 2.00 cm MV A velocity: 63.30 cm/s MV E/A ratio:  1.08 Eleonore Chiquito MD Electronically signed by Eleonore Chiquito MD Signature Date/Time: 12/29/2021/4:27:03 PM    Final    IR US Guide Bx Asp/Drain  Result Date:  12/08/2021 INDICATION: 51 year old woman with history of  pelvic malignancy presents to IR for biopsy of right upper quadrant satellite lesion. EXAM: Ultrasound-guided biopsy of right upper quadrant satellite lesion MEDICATIONS: None. ANESTHESIA/SEDATION: Moderate (conscious) sedation was employed during this procedure. A total of Versed 1 mg and Fentanyl 50 mcg was administered intravenously. Moderate Sedation Time: 10 minutes. The patient's level of consciousness and vital signs were monitored continuously by radiology nursing throughout the procedure under my direct supervision. FLUOROSCOPY TIME:  None COMPLICATIONS: None immediate. PROCEDURE: Informed written consent was obtained from the patient after a thorough discussion of the procedural risks, benefits and alternatives. All questions were addressed. Maximal Sterile Barrier Technique was utilized including caps, mask, sterile gowns, sterile gloves, sterile drape, hand hygiene and skin antiseptic. A timeout was performed prior to the initiation of the procedure. Patient position supine on the ultrasound table. Right upper quadrant skin prepped and draped in usual sterile fashion. Following local lidocaine administration, 17 gauge introducer needle was advanced into the right upper quadrant mass, and 4- 18 gauge cores were obtained utilizing continuous ultrasound guidance. Samples were sent to pathology in formalin. Needle removed and hemostasis achieved with 5 minutes of manual compression. Post procedure ultrasound images showed no evidence of significant hemorrhage. IMPRESSION: Ultrasound-guided biopsy of right upper quadrant mass. Electronically Signed   By: Miachel Roux M.D.   On: 12/08/2021 12:37   IR IMAGING GUIDED PORT INSERTION  Result Date: 12/08/2021 INDICATION: Metastatic pelvic malignancy EXAM: IMPLANTED PORT A CATH PLACEMENT WITH ULTRASOUND AND FLUOROSCOPIC GUIDANCE MEDICATIONS: None ANESTHESIA/SEDATION: Moderate (conscious) sedation was  employed during this procedure. A total of Versed 2 mg and Fentanyl 150 mcg was administered intravenously by the radiology nurse. Total intra-service moderate Sedation Time: 21 minutes. The patient's level of consciousness and vital signs were monitored continuously by radiology nursing throughout the procedure under my direct supervision. FLUOROSCOPY: Radiation Exposure Index (as provided by the fluoroscopic device): 7 mGy [place] COMPLICATIONS: None immediate. PROCEDURE: The procedure, risks, benefits, and alternatives were explained to the patient. Questions regarding the procedure were encouraged and answered. The patient understands and consents to the procedure. A timeout was performed prior to the initiation of the procedure. Patient positioned supine on the angiography table. Right neck and anterior upper chest prepped and draped in the usual sterile fashion. All elements of maximal sterile barrier were utilized including, cap, mask, sterile gown, sterile gloves, large sterile drape, hand scrubbing and 2% Chlorhexidine for skin cleaning. The right internal jugular vein was evaluated with ultrasound and shown to be patent. A permanent ultrasound image was obtained and placed in the patient's medical record. Local anesthesia was provided with 1% lidocaine with epinephrine. Using sterile gel and a sterile probe cover, the right internal jugular vein was entered with a 21 ga needle during real time ultrasound guidance. 0.018 inch guidewire placed and 21 ga needle exchanged for transitional dilator set. Utilizing fluoroscopy, 0.035 inch guidewire advanced centrally without difficulty. Attention then turned to the right anterior upper chest. Following local lidocaine administration, a port pocket was created. The catheter was connected to the port and brought from the pocket to the venotomy site through a subcutaneous tunnel. The catheter was cut to size and inserted through the peel-away sheath. The catheter tip  was positioned at the cavoatrial junction using fluoroscopic guidance. The port aspirated and flushed well. The port pocket was closed with deep and superficial absorbable suture. The port pocket incision and venotomy sites were also sealed with Dermabond. IMPRESSION: Successful placement of a right internal jugular approach power injectable  Port-A-Cath. The catheter is ready for immediate use. Electronically Signed   By: Miachel Roux M.D.   On: 12/08/2021 12:36   VAS [place] LOWER EXTREMITY VENOUS (DVT)  Result Date: 12/07/2021  Lower Venous DVT Study Patient Name:  JEROLENE KUPFER  Date of Exam:   12/07/2021 Medical Rec #: [id number]       Accession #:    [id number] Date of Birth: 1970/06/30       Patient Gender: F Patient Age:   86 years Exam Location:  [facility] Procedure:      VAS [place] LOWER EXTREMITY VENOUS (DVT) Referring Phys: Jeral Pinch --------------------------------------------------------------------------------  Indications: Abdominopelvic mass with compression of IVC.  Comparison Study: No prior studies. Performing Technologist: Darlin Coco RDMS, RVT  Examination Guidelines: A complete evaluation includes B-mode imaging, spectral Doppler, color Doppler, and power Doppler as needed of all accessible portions of each vessel. Bilateral testing is considered an integral part of a complete examination. Limited examinations for reoccurring indications may be performed as noted. The reflux portion of the exam is performed with the patient in reverse Trendelenburg.  +---------+---------------+---------+-----------+----------+--------------+ RIGHT    CompressibilityPhasicitySpontaneityPropertiesThrombus Aging +---------+---------------+---------+-----------+----------+--------------+ CFV      Full           Yes      Yes                                 +---------+---------------+---------+-----------+----------+--------------+ SFJ      Full                                                         +---------+---------------+---------+-----------+----------+--------------+ FV Prox  Full                                                        +---------+---------------+---------+-----------+----------+--------------+ FV Mid   Full                                                        +---------+---------------+---------+-----------+----------+--------------+ FV DistalFull                                                        +---------+---------------+---------+-----------+----------+--------------+ PFV      Full                                                        +---------+---------------+---------+-----------+----------+--------------+ POP      Full           Yes      Yes                                 +---------+---------------+---------+-----------+----------+--------------+  PTV      Full                                                        +---------+---------------+---------+-----------+----------+--------------+ PERO     Full                                                        +---------+---------------+---------+-----------+----------+--------------+ Gastroc  Full                                                        +---------+---------------+---------+-----------+----------+--------------+   +---------+---------------+---------+-----------+----------+--------------+ LEFT     CompressibilityPhasicitySpontaneityPropertiesThrombus Aging +---------+---------------+---------+-----------+----------+--------------+ CFV      Full           Yes      Yes                                 +---------+---------------+---------+-----------+----------+--------------+ SFJ      Full                                                        +---------+---------------+---------+-----------+----------+--------------+ FV Prox  Full                                                         +---------+---------------+---------+-----------+----------+--------------+ FV Mid   Full                                                        +---------+---------------+---------+-----------+----------+--------------+ FV DistalFull                                                        +---------+---------------+---------+-----------+----------+--------------+ PFV      Full                                                        +---------+---------------+---------+-----------+----------+--------------+ POP      Full           Yes      Yes                                 +---------+---------------+---------+-----------+----------+--------------+   PTV      Full                                                        +---------+---------------+---------+-----------+----------+--------------+ PERO     Full                                                        +---------+---------------+---------+-----------+----------+--------------+ [facility]  Full                                                        +---------+---------------+---------+-----------+----------+--------------+     Summary: RIGHT: - There is no evidence of deep vein thrombosis in the lower extremity.  - No cystic structure found in the popliteal fossa.  LEFT: - There is no evidence of deep vein thrombosis in the lower extremity.  - No cystic structure found in the popliteal fossa.  *See table(s) above for measurements and observations. Electronically signed by Deitra Mayo MD on 12/07/2021 at 12:56:56 PM.    Final    CT ABDOMEN W CONTRAST  Result Date: 12/07/2021 CLINICAL DATA:  Pelvic mass. Malignant ascites. Metastatic disease evaluation. * Tracking Code: BO * EXAM: CT ABDOMEN WITH CONTRAST TECHNIQUE: Multidetector CT imaging of the abdomen was performed using the standard protocol following bolus administration of intravenous contrast. RADIATION DOSE REDUCTION: This exam was performed  according to the departmental dose-optimization program which includes automated exposure control, adjustment of the mA and/or kV according to patient size and/or use of iterative reconstruction technique. CONTRAST:  22m OMNIPAQUE IOHEXOL 350 MG/ML SOLN COMPARISON:  Pelvic MRI 12/02/2021.  Abdominopelvic CT 09/29/2005. FINDINGS: Lower chest:  Chest findings dictated separately. Hepatobiliary: There is a large heterogeneous, partially calcified mass along the superior aspect of the right hepatic lobe which is likely subcapsular in origin based on the reformatted images. This measures approximately 10.3 x 8.0 x 5.1 cm and shows heterogeneous enhancement following contrast. There may be invasion of the liver, especially by a 1.8 cm component on coronal image 45/5. No other separate hepatic lesions. No evidence of gallstones, gallbladder wall thickening or biliary dilatation. Pancreas: Unremarkable. No pancreatic ductal dilatation or surrounding inflammatory changes. Spleen: Normal in size without focal abnormality. Adrenals/Urinary Tract: Both adrenal glands appear normal. No evidence of urinary tract calculus, suspicious renal lesion or hydronephrosis Bladder not imaged. Stomach/Bowel: No enteric contrast administered. The stomach appears unremarkable for its degree of distention. The visualized bowel demonstrates no wall thickening, distention or surrounding inflammation. The appendix appears normal. Vascular/Lymphatic: There are no enlarged abdominal lymph nodes. Aortic and branch vessel atherosclerosis without evidence of large vessel occlusion or aneurysm. The portal, superior mesenteric and splenic veins appear patent. Other: Small amount of abdominal ascites. As above, there is a large subcapsular metastasis involving the superior aspect of the liver. There is a right omental implant measuring 6.9 x 3.5 cm on image 28/2. There is a very large central mass involving the upper abdomen and pelvis, incompletely  visualized by this CT of the  abdomen, but measuring up to 16.5 x 12.2 cm on image 37/2. This was seen on recent pelvic MRI. Musculoskeletal: No acute or significant osseous findings. IMPRESSION: 1. Large central abdominal and pelvic mass consistent with known malignancy. This is incompletely visualized by this CT of the abdomen, but was seen on recent pelvic MRI. 2. 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 Right omental implant consistent with metastatic disease. Small amount of abdominal ascites. 3. No evidence for bowel or ureteral obstruction. 4. Aortic Atherosclerosis (ICD10-I70.0). 5. Chest findings dictated separately. Electronically Signed   By: Richardean Sale M.D.   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  Cough. EXAM: CT ANGIOGRAPHY CHEST WITH CONTRAST TECHNIQUE: Multidetector CT imaging of the chest was performed using the standard protocol during bolus administration of intravenous contrast. Multiplanar CT image reconstructions and MIPs were obtained to evaluate the vascular anatomy. RADIATION DOSE REDUCTION: This exam was performed according to the departmental dose-optimization program which includes automated exposure control, adjustment of the mA and/or kV according to patient size and/or use of iterative reconstruction technique. CONTRAST:  49m OMNIPAQUE IOHEXOL 350 MG/ML SOLN COMPARISON:  CT Abdomen today reported separately. FINDINGS: Cardiovascular: Excellent contrast bolus timing in the pulmonary arterial tree. Mild respiratory motion. No pulmonary artery filling defect. No calcified coronary artery atherosclerosis is evident. Negative visible aorta. No cardiomegaly or pericardial effusion. Mediastinum/Nodes: No mediastinal mass or lymphadenopathy. Lungs/Pleura: Major airways are patent. Both  lungs are clear except for minor dependent atelectasis. No pulmonary nodule, mass, pleural effusion, consolidation. Upper Abdomen: Reported separately today, liver mass and ascites are visible. Musculoskeletal: No acute or suspicious osseous lesion in the chest. Review of the MIP images confirms the above findings. IMPRESSION: 1. Negative for pulmonary embolism. And no acute or metastatic process identified in the Chest. 2. Partially visible liver mass and ascites, staging CT Abdomen today is reported separately. Electronically Signed   By: HGenevie AnnM.D.   On: 12/07/2021 10:54   MR PELVIS W WO CONTRAST  Result Date: 12/02/2021 CLINICAL DATA:  Pelvic mass for 3 months. EXAM: MRI PELVIS WITHOUT AND WITH CONTRAST TECHNIQUE: Multiplanar multisequence MR imaging of the pelvis was performed both before and after administration of intravenous contrast. CONTRAST:  5 cc Vueway COMPARISON:  None Available. FINDINGS: Urinary Tract:  Unremarkable where included Bowel: Bowel is displaced by the large abdominopelvic tumor. Otherwise unremarkable. Vascular/Lymphatic: Flattening of the IVC at the level of the aortic bifurcation for example on image 34 series 12 due to the abdominopelvic mass. No findings of IVC thrombus or pelvic DVT. Reproductive: Multilobulated and heterogeneously enhancing 21.1 by 10.9 by 18.7 cm (volume = 2250 cm^3) mass is present with composed opponents abutting both adnexa. A satellite mass in the right upper quadrant anteriorly measures 6.9 by 4.1 by 6.4 cm (volume = 95 cm^3) on image 15 of series 4. There is moderate ascites with tumor deposits along the pelvic ascites posteriorly for example on images 79 through 87 of series 16. The mass primarily extends above the uterus, and could be arising from the right ovary, left ovary, or both. There is abnormal enhancement along ascites margins in the paracolic gutter most compatible with malignant ascites. Today's exam was not meant to include the upper  abdomen as this is an MRI of the pelvis. However, coronal images include enough  of upper abdomen to indicate that there is likely a at least 7 cm tumor deposit just above the liver (image 14, series 2). Other:  No supplemental non-categorized findings. Musculoskeletal: Low-level edema along the lateral abdominal wall musculature for example on image 6 series 4, otherwise unremarkable. IMPRESSION: 1. 21.1 by 10.9 by 18.7 cm abdominopelvic mass, with a satellite mass in the right upper quadrant anteriorly, and moderate ascites with tumor deposits along the pelvic ascites posteriorly. The mass could be arising from right or left ovary, or both. The mass displaces the bowel and IVC, but there is no IVC thrombus or pelvic DVT identified. 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 2. Additional satellite mass above the liver is partially included, along with tumor deposits along the ascites. 3. Low-level edema along the lateral abdominal wall musculature. 4. Flattening of the IVC at the level of the aortic bifurcation due to the large abdominopelvic tumor. No findings of pelvic DVT. Electronically Signed   By: Van Clines M.D.   On: 12/02/2021 16:31

## 2021-12-30 NOTE — Assessment & Plan Note (Signed)
From treatment perspective, she has developed severe pancytopenia The cause of her fever is unknown Tumor fever or neutropenic fever from infection cannot be excluded although she does not have any localizing signs apart from sinus congestion Continue supportive care

## 2021-12-30 NOTE — Assessment & Plan Note (Signed)
She has developed neutropenic fever She has no localizing signs apart from sinus congestion We discussed the risk and benefits of admission versus outpatient management with oral antibiotics Chest x-ray is unremarkable Urinalysis is unremarkable Urine culture is pending Blood cultures pending Ultimately, the patient elected for outpatient management I will prescribe a course of Augmentin I will reassess tomorrow

## 2021-12-30 NOTE — Telephone Encounter (Signed)
Called back regarding her message of temperature of  101.1 now, she will take tylenol now. Instructed to arrive at 1:30 pm for chest xray to [facility], then lab at 2:15 pm and see Dr. Alvy Bimler at 2:45 pm, see verbalized understanding.

## 2021-12-30 NOTE — Assessment & Plan Note (Signed)
She has acquired pancytopenia due to treatment Her tachycardia could be due to symptomatic anemia We discussed the risk and benefits of blood transfusion support We will get ABO Rh and blood type performed today I plan to recheck her blood again tomorrow If her blood count drops further tomorrow, we will arrange for a unit of blood transfusion

## 2021-12-31 ENCOUNTER — Other Ambulatory Visit: Payer: Self-pay | Admitting: Hematology and Oncology

## 2021-12-31 ENCOUNTER — Inpatient Hospital Stay (HOSPITAL_BASED_OUTPATIENT_CLINIC_OR_DEPARTMENT_OTHER): Payer: 59 | Admitting: Hematology and Oncology

## 2021-12-31 ENCOUNTER — Inpatient Hospital Stay: Payer: 59

## 2021-12-31 ENCOUNTER — Encounter: Payer: Self-pay | Admitting: Hematology and Oncology

## 2021-12-31 VITALS — BP 125/68 | HR 123 | Temp 97.4°F | Resp 18 | Ht 59.0 in | Wt 117.8 lb

## 2021-12-31 DIAGNOSIS — R5081 Fever presenting with conditions classified elsewhere: Secondary | ICD-10-CM

## 2021-12-31 DIAGNOSIS — D61818 Other pancytopenia: Secondary | ICD-10-CM | POA: Diagnosis not present

## 2021-12-31 DIAGNOSIS — D709 Neutropenia, unspecified: Secondary | ICD-10-CM

## 2021-12-31 DIAGNOSIS — D701 Agranulocytosis secondary to cancer chemotherapy: Secondary | ICD-10-CM | POA: Diagnosis not present

## 2021-12-31 DIAGNOSIS — C569 Malignant neoplasm of unspecified ovary: Secondary | ICD-10-CM | POA: Diagnosis not present

## 2021-12-31 LAB — CBC WITH DIFFERENTIAL (CANCER CENTER ONLY)
Abs Immature Granulocytes: 0 10*3/uL (ref 0.00–0.07)
Basophils Absolute: 0 10*3/uL (ref 0.0–0.1)
Basophils Relative: 0 %
Eosinophils Absolute: 0 10*3/uL (ref 0.0–0.5)
Eosinophils Relative: 3 %
HCT: 24.4 % — ABNORMAL LOW (ref 36.0–46.0)
Hemoglobin: 8.1 g/dL — ABNORMAL LOW (ref 12.0–15.0)
Immature Granulocytes: 0 %
Lymphocytes Relative: 69 %
Lymphs Abs: 0.2 10*3/uL — ABNORMAL LOW (ref 0.7–4.0)
MCH: 28.6 pg (ref 26.0–34.0)
MCHC: 33.2 g/dL (ref 30.0–36.0)
MCV: 86.2 fL (ref 80.0–100.0)
Monocytes Absolute: 0 10*3/uL — ABNORMAL LOW (ref 0.1–1.0)
Monocytes Relative: 3 %
Neutro Abs: 0.1 10*3/uL — CL (ref 1.7–7.7)
Neutrophils Relative %: 25 %
Platelet Count: 130 10*3/uL — ABNORMAL LOW (ref 150–400)
RBC: 2.83 MIL/uL — ABNORMAL LOW (ref 3.87–5.11)
RDW: 13.9 % (ref 11.5–15.5)
Smear Review: NORMAL
WBC Count: 0.3 10*3/uL — CL (ref 4.0–10.5)
nRBC: 0 % (ref 0.0–0.2)

## 2021-12-31 LAB — URINE CULTURE

## 2021-12-31 NOTE — Progress Notes (Signed)
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[facility]) From treatment perspective, she has developed severe pancytopenia The cause of her fever is unknown Tumor fever or neutropenic fever from infection cannot be excluded although she does not have any localizing signs apart from sinus congestion Continue supportive care  Neutropenic fever ([place]) She has developed neutropenic fever She has no localizing signs apart from sinus congestion We discussed the risk and benefits of admission versus outpatient management with oral antibiotics Chest x-ray is unremarkable Urinalysis is unremarkable Urine culture is pending Blood cultures pending, so far preliminary blood cultures negative Ultimately, the patient, elected for outpatient management She has started on Augmentin and so far did not have any fever greater than 101 I will call and check on her on Monday She has appointment scheduled for Tuesday  Pancytopenia, acquired [facility]) She has acquired pancytopenia due to treatment Her tachycardia could be due to symptomatic anemia or related to her disease She does not need transfusion support today There is no contraindication to remain on antiplatelet agents or anticoagulants as long as the platelet is greater than 50,000.   Orders Placed This Encounter  Procedures   CBC with Differential ([place] Only)    Standing Status:   Future    Standing Expiration Date:   01/05/2023   Sample to Blood Bank    Standing Status:   Future    Standing Expiration Date:   01/01/2023    All questions were answered. The patient knows to call the clinic with any problems, questions  or concerns. The total time spent in the appointment was 20 minutes encounter with patients including review of chart and various tests results, discussions about plan of care and coordination of care plan   Heath Lark, MD 12/31/2021 9:29 AM  INTERVAL HISTORY: Please see below for problem oriented charting. she returns for further evaluation due to neutropenic fever and severe pancytopenia She has taken 2 doses of antibiotics so far She felt weak overall The highest recorded temperature this morning was 100.4 Again, she have no localizing signs of infection Denies recent bleeding  REVIEW OF SYSTEMS:   Eyes: Denies blurriness of vision Ears, nose, mouth, throat, and face: Denies mucositis or sore throat Respiratory: Denies cough, dyspnea or wheezes Cardiovascular: Denies palpitation, chest discomfort or lower extremity swelling Gastrointestinal:  Denies nausea, heartburn or change in bowel habits Skin: Denies abnormal skin rashes Lymphatics: Denies new lymphadenopathy or easy bruising Neurological:Denies numbness, tingling or new weaknesses Behavioral/Psych: Mood is stable, no new changes  All other systems were reviewed with the patient and are negative.  I have reviewed the past medical history, past surgical history, social history and family history with the patient and they are unchanged from previous note.  ALLERGIES:  is allergic to latex, sulfa antibiotics, sulfamethoxazole-trimethoprim, and septra [bactrim].  MEDICATIONS:  Current Outpatient Medications  Medication Sig Dispense Refill   amoxicillin-clavulanate (AUGMENTIN) 875-125 MG tablet Take 1 tablet by mouth 2 (two) times daily. 14 tablet 0   apixaban (ELIQUIS) 2.5 MG TABS tablet Take 1 tablet (2.5 mg total) by mouth 2 (two) times daily. Wait to start this until after you have had your procedures 60 tablet 6  dexamethasone (DECADRON) 4 MG tablet Take 2 tablets (8 mg total) by mouth daily. Start the day after last  cisplatin dose on day 6 of chemo for 3 days. Take with food. 30 tablet 1   famotidine (PEPCID) 10 MG tablet Take 10 mg by mouth daily as needed for heartburn or indigestion.     furosemide (LASIX) 20 MG tablet Take 1 tablet (20 mg total) by mouth daily for 5 days. 5 tablet 0   lidocaine-prilocaine (EMLA) cream Apply to affected area once 30 g 3   loratadine (CLARITIN) 10 MG tablet Take 10 mg by mouth daily as needed for allergies.     LORazepam (ATIVAN) 0.5 MG tablet Take 1 tablet (0.5 mg total) by mouth every 8 (eight) hours. 30 tablet 0   ondansetron (ZOFRAN) 8 MG tablet Take 1 tablet (8 mg total) by mouth every 8 (eight) hours as needed for nausea or vomiting. Start on the third day after last cisplatin dose on day 8 of chemo 30 tablet 1   prochlorperazine (COMPAZINE) 10 MG tablet Take 1 tablet (10 mg total) by mouth every 6 (six) hours as needed for nausea or vomiting. 30 tablet 1   No current facility-administered medications for this visit.    SUMMARY OF ONCOLOGIC HISTORY: Oncology History  Malignant germ cell tumor of ovary ([place])  12/01/2021 Tumor Marker   CA-125 is elevated at 285, [place]-fet[place] [address], beta-hCG is elevated at 116   12/02/2021 Imaging   MRI pelvis  1. 21.1 by 10.9 by 18.7 cm abdominopelvic mass, with a satellite mass in the right upper quadrant anteriorly, and moderate ascites with tumor deposits along the pelvic ascites posteriorly. The mass could be arising from right or left ovary, or both. The mass displaces the bowel and IVC, but there is no IVC thrombus or pelvic DVT identified. 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 2. Additional satellite mass above the liver is partially included, along with tumor deposits along the ascites. 3. Low-level edema along the lateral abdominal wall musculature. 4. Flattening of the IVC at the level of the aortic bifurcation due to the large abdominopelvic tumor. No findings of  pelvic DVT.     12/07/2021 Imaging   CT abdomen and pelvis 1. Large central abdominal and pelvic mass consistent with known malignancy. This is incompletely visualized by this CT of the abdomen, but was seen on recent pelvic MRI. 2. 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 Right omental implant consistent with metastatic disease. Small amount of abdominal ascites. 3. No evidence for bowel or ureteral obstruction. 4. Aortic Atherosclerosis (ICD10-I70.0). 5. Chest findings dictated separately.   12/07/2021 Imaging   CT chest 1. Negative for pulmonary embolism. And no acute or metastatic process identified in the Chest. 2. Partially visible liver mass and ascites, staging CT Abdomen today is reported separately.     12/07/2021 Procedure   Successful placement of a right internal jugular approach power injectable Port-A-Cath. The catheter is ready for immediate use.   12/07/2021 Procedure   Ultrasound-guided biopsy of right upper quadrant mass.    12/08/2021 Initial Diagnosis   Ovarian cancer ([place])   12/08/2021 Cancer Staging   Staging form: Ovary, Fallopian Tube, and Primary Peritoneal Carcinoma, AJCC 8th Edition - Clinical stage from 12/08/2021: FIGO Stage IIIC (cT3c, cN0, cM0) - Signed by Heath Lark, MD on 12/10/2021 Stage prefix: Initial diagnosis   12/20/2021 -  Chemotherapy   Patient is on Treatment Plan : Ovarian germ cell tumor q21d x 3 Cycles       PHYSICAL EXAMINATION: ECOG PERFORMANCE STATUS: 1 - Symptomatic but completely ambulatory  Vitals:   12/31/21 0828  BP: 125/68  Pulse: (!) 123  Resp: 18  Temp: (!) 97.4 F (36.3 C)  SpO2: 100%   Filed Weights   12/31/21 0828  Weight: 117 lb 12.8 oz (53.4 kg)    GENERAL:alert, no distress and comfortable NEURO: alert & oriented x 3 with fluent speech, no focal motor/sensory deficits  LABORATORY DATA:  I have reviewed the data as  listed    Component Value Date/Time   NA 136 12/30/2021 1443   K 3.4 (L) 12/30/2021 1443   CL 101 12/30/2021 1443   CO2 24 12/30/2021 1443   GLUCOSE 134 (H) 12/30/2021 1443   BUN 9 12/30/2021 1443   CREATININE 0.52 12/30/2021 1443   CREATININE 0.75 03/23/2011 1550   CALCIUM 8.4 (L) 12/30/2021 1443   PROT 6.4 (L) 12/30/2021 1443   ALBUMIN 3.2 (L) 12/30/2021 1443   AST 10 (L) 12/30/2021 1443   ALT 16 12/30/2021 1443   ALKPHOS 100 12/30/2021 1443   BILITOT 0.6 12/30/2021 1443   GFRNONAA >60 12/30/2021 1443    No results found for: "SPEP", "UPEP"  Lab Results  Component Value Date   WBC 0.3 (LL) 12/31/2021   NEUTROABS 0.1 (LL) 12/31/2021   HGB 8.1 (L) 12/31/2021   HCT 24.4 (L) 12/31/2021   MCV 86.2 12/31/2021   PLT 130 (L) 12/31/2021      Chemistry      Component Value Date/Time   NA 136 12/30/2021 1443   K 3.4 (L) 12/30/2021 1443   CL 101 12/30/2021 1443   CO2 24 12/30/2021 1443   BUN 9 12/30/2021 1443   CREATININE 0.52 12/30/2021 1443   CREATININE 0.75 03/23/2011 1550      Component Value Date/Time   CALCIUM 8.4 (L) 12/30/2021 1443   ALKPHOS 100 12/30/2021 1443   AST 10 (L) 12/30/2021 1443   ALT 16 12/30/2021 1443   BILITOT 0.6 12/30/2021 1443       RADIOGRAPHIC STUDIES: I have personally reviewed the radiological images as listed and agreed with the findings in the report. DG Chest 2 View  Result Date: 12/30/2021 CLINICAL DATA:  Fever EXAM: CHEST - 2 VIEW COMPARISON:  CT chest done on 12/07/2021 FINDINGS: Cardiac size is within normal limits. There are no signs of pulmonary edema or focal pulmonary consolidation. There is no pleural effusion or pneumothorax. Tip of right IJ chest port is seen in superior vena cava. IMPRESSION: No active cardiopulmonary disease. Electronically Signed   By: Elmer Picker M.D.   On: 12/30/2021 13:54   ECHOCARDIOGRAM COMPLETE  Result Date: 12/29/2021    ECHOCARDIOGRAM REPORT   Patient Name:   [name] Date of  Exam: 12/29/2021 Medical Rec #:  [id number]      Height:       59.0 in Accession #:    [id number]     Weight:       122.0 lb Date of Birth:  09-Apr-1970      BSA:          1.495 m Patient Age:    51 years       BP:           100/60 mmHg Patient Gender: F              HR:  107 bpm. Exam Location:  Outpatient Procedure: 2D Echo, 3D Echo, Color Doppler, Cardiac Doppler and Strain Analysis Indications:    R00.0 Tachycardia  History:        Patient has no prior history of Echocardiogram examinations.                 Risk Factors:Non-Smoker. Malignant germ cell tumor of ovary;                 possibility of impingement of the vena cava by her abdominal                 mass.  Sonographer:    Leavy Cella RDCS Referring Phys: [id number] [place]  1. Left ventricular ejection fraction, by estimation, is 55 to 60%. The left ventricle has normal function. The left ventricle has no regional wall motion abnormalities. Indeterminate diastolic filling due to E-A fusion.  2. Right ventricular systolic function is normal. The right ventricular size is normal. Tricuspid regurgitation signal is inadequate for assessing PA pressure.  3. The mitral valve is grossly normal. No evidence of mitral valve regurgitation. No evidence of mitral stenosis.  4. The aortic valve is tricuspid. Aortic valve regurgitation is not visualized. No aortic stenosis is present.  5. The inferior vena cava is normal in size with greater than 50% respiratory variability, suggesting right atrial pressure of 3 mmHg. Conclusion(s)/Recommendation(s): Normal biventricular function without evidence of hemodynamically significant valvular heart disease. FINDINGS  Left Ventricle: Left ventricular ejection fraction, by estimation, is 55 to 60%. The left ventricle has normal function. The left ventricle has no regional wall motion abnormalities. Global longitudinal strain performed but not reported based on interpreter judgement due to  suboptimal tracking. 3D left ventricular ejection fraction analysis performed but not reported based on interpreter judgement due to suboptimal tracking. The left ventricular internal cavity size was normal in size. There is no left ventricular hypertrophy. Indeterminate diastolic filling due to E-A fusion. Right Ventricle: The right ventricular size is normal. No increase in right ventricular wall thickness. Right ventricular systolic function is normal. Tricuspid regurgitation signal is inadequate for assessing PA pressure. Left Atrium: Left atrial size was normal in size. Right Atrium: Right atrial size was normal in size. Pericardium: There is no evidence of pericardial effusion. Mitral Valve: The mitral valve is grossly normal. No evidence of mitral valve regurgitation. No evidence of mitral valve stenosis. Tricuspid Valve: The tricuspid valve is grossly normal. Tricuspid valve regurgitation is not demonstrated. No evidence of tricuspid stenosis. Aortic Valve: The aortic valve is tricuspid. Aortic valve regurgitation is not visualized. No aortic stenosis is present. Pulmonic Valve: The pulmonic valve was grossly normal. Pulmonic valve regurgitation is not visualized. No evidence of pulmonic stenosis. Aorta: The aortic root and ascending aorta are structurally normal, with no evidence of dilitation. Venous: The inferior vena cava is normal in size with greater than 50% respiratory variability, suggesting right atrial pressure of 3 mmHg. IAS/Shunts: The atrial septum is grossly normal.  LEFT VENTRICLE PLAX 2D LVIDd:         3.95 cm   Diastology LVIDs:         2.14 cm   LV e' medial:    11.20 cm/s LV PW:         0.92 cm   LV E/e' medial:  6.1 LV IVS:        0.74 cm   LV e' lateral:   13.80 cm/s LVOT diam:     2.00  cm   LV E/e' lateral: 5.0 LV SV:         41 LV SV Index:   28 LVOT Area:     3.14 cm                           3D Volume EF:                          3D EF:        50 %                          LV EDV:        191 ml                          LV ESV:       96 ml                          LV SV:        95 ml RIGHT VENTRICLE RV Basal diam:  2.72 cm RV Mid diam:    2.27 cm RV S prime:     18.60 cm/s TAPSE (M-mode): 1.9 cm LEFT ATRIUM           Index        RIGHT ATRIUM          Index LA diam:      2.80 cm 1.87 cm/m   RA Area:     8.16 cm LA Vol (A2C): 8.1 ml  5.44 ml/m   RA Volume:   15.40 ml 10.30 ml/m LA Vol (A4C): 24.4 ml 16.32 ml/m  AORTIC VALVE LVOT Vmax:   95.00 cm/s LVOT Vmean:  59.800 cm/s LVOT VTI:    0.132 m  AORTA Ao Root diam: 2.40 cm Ao Asc diam:  2.40 cm MITRAL VALVE MV Area (PHT): 4.06 cm    SHUNTS MV Decel Time: 187 msec    Systemic VTI:  0.13 m MV E velocity: 68.60 cm/s  Systemic Diam: 2.00 cm MV A velocity: 63.30 cm/s MV E/A ratio:  1.08 Eleonore Chiquito MD Electronically signed by Eleonore Chiquito MD Signature Date/Time: 12/29/2021/4:27:03 PM    Final    IR US Guide Bx Asp/Drain  Result Date: 12/08/2021 INDICATION: 51 year old woman with history of pelvic malignancy presents to IR for biopsy of right upper quadrant satellite lesion. EXAM: Ultrasound-guided biopsy of right upper quadrant satellite lesion MEDICATIONS: None. ANESTHESIA/SEDATION: Moderate (conscious) sedation was employed during this procedure. A total of Versed 1 mg and Fentanyl 50 mcg was administered intravenously. Moderate Sedation Time: 10 minutes. The patient's level of consciousness and vital signs were monitored continuously by radiology nursing throughout the procedure under my direct supervision. FLUOROSCOPY TIME:  None COMPLICATIONS: None immediate. PROCEDURE: Informed written consent was obtained from the patient after a thorough discussion of the procedural risks, benefits and alternatives. All questions were addressed. Maximal Sterile Barrier Technique was utilized including caps, mask, sterile gowns, sterile gloves, sterile drape, hand hygiene and skin antiseptic. A timeout was performed prior to the initiation of the  procedure. Patient position supine on the ultrasound table. Right upper quadrant skin prepped and draped in usual sterile fashion. Following local lidocaine administration, 17 gauge introducer needle was advanced into the right upper quadrant mass, and 4- 18 gauge cores were obtained  utilizing continuous ultrasound guidance. Samples were sent to pathology in formalin. Needle removed and hemostasis achieved with 5 minutes of manual compression. Post procedure ultrasound images showed no evidence of significant hemorrhage. IMPRESSION: Ultrasound-guided biopsy of right upper quadrant mass. Electronically Signed   By: Miachel Roux M.D.   On: 12/08/2021 12:37   IR IMAGING GUIDED PORT INSERTION  Result Date: 12/08/2021 INDICATION: Metastatic pelvic malignancy EXAM: IMPLANTED PORT A CATH PLACEMENT WITH ULTRASOUND AND FLUOROSCOPIC GUIDANCE MEDICATIONS: None ANESTHESIA/SEDATION: Moderate (conscious) sedation was employed during this procedure. A total of Versed 2 mg and Fentanyl 150 mcg was administered intravenously by the radiology nurse. Total intra-service moderate Sedation Time: 21 minutes. The patient's level of consciousness and vital signs were monitored continuously by radiology nursing throughout the procedure under my direct supervision. FLUOROSCOPY: Radiation Exposure Index (as provided by the fluoroscopic device): 7 mGy [place] COMPLICATIONS: None immediate. PROCEDURE: The procedure, risks, benefits, and alternatives were explained to the patient. Questions regarding the procedure were encouraged and answered. The patient understands and consents to the procedure. A timeout was performed prior to the initiation of the procedure. Patient positioned supine on the angiography table. Right neck and anterior upper chest prepped and draped in the usual sterile fashion. All elements of maximal sterile barrier were utilized including, cap, mask, sterile gown, sterile gloves, large sterile drape, hand scrubbing and 2%  Chlorhexidine for skin cleaning. The right internal jugular vein was evaluated with ultrasound and shown to be patent. A permanent ultrasound image was obtained and placed in the patient's medical record. Local anesthesia was provided with 1% lidocaine with epinephrine. Using sterile gel and a sterile probe cover, the right internal jugular vein was entered with a 21 ga needle during real time ultrasound guidance. 0.018 inch guidewire placed and 21 ga needle exchanged for transitional dilator set. Utilizing fluoroscopy, 0.035 inch guidewire advanced centrally without difficulty. Attention then turned to the right anterior upper chest. Following local lidocaine administration, a port pocket was created. The catheter was connected to the port and brought from the pocket to the venotomy site through a subcutaneous tunnel. The catheter was cut to size and inserted through the peel-away sheath. The catheter tip was positioned at the cavoatrial junction using fluoroscopic guidance. The port aspirated and flushed well. The port pocket was closed with deep and superficial absorbable suture. The port pocket incision and venotomy sites were also sealed with Dermabond. IMPRESSION: Successful placement of a right internal jugular approach power injectable Port-A-Cath. The catheter is ready for immediate use. Electronically Signed   By: Miachel Roux M.D.   On: 12/08/2021 12:36   VAS [place] LOWER EXTREMITY VENOUS (DVT)  Result Date: 12/07/2021  Lower Venous DVT Study Patient Name:  [name]  Date of Exam:   12/07/2021 Medical Rec #: [id number]       Accession #:    [id number] Date of Birth: 1970-11-05       Patient Gender: F Patient Age:   56 years Exam Location:  [facility] Procedure:      VAS [place] LOWER EXTREMITY VENOUS (DVT) Referring Phys: Jeral Pinch --------------------------------------------------------------------------------  Indications: Abdominopelvic mass with compression of IVC.  Comparison Study:  No prior studies. Performing Technologist: Darlin Coco RDMS, RVT  Examination Guidelines: A complete evaluation includes B-mode imaging, spectral Doppler, color Doppler, and power Doppler as needed of all accessible portions of each vessel. Bilateral testing is considered an integral part of a complete examination. Limited examinations for reoccurring indications may be performed as  noted. The reflux portion of the exam is performed with the patient in reverse Trendelenburg.  +---------+---------------+---------+-----------+----------+--------------+ RIGHT    CompressibilityPhasicitySpontaneityPropertiesThrombus Aging +---------+---------------+---------+-----------+----------+--------------+ CFV      Full           Yes      Yes                                 +---------+---------------+---------+-----------+----------+--------------+ SFJ      Full                                                        +---------+---------------+---------+-----------+----------+--------------+ FV Prox  Full                                                        +---------+---------------+---------+-----------+----------+--------------+ FV Mid   Full                                                        +---------+---------------+---------+-----------+----------+--------------+ FV DistalFull                                                        +---------+---------------+---------+-----------+----------+--------------+ PFV      Full                                                        +---------+---------------+---------+-----------+----------+--------------+ POP      Full           Yes      Yes                                 +---------+---------------+---------+-----------+----------+--------------+ PTV      Full                                                        +---------+---------------+---------+-----------+----------+--------------+ PERO     Full                                                         +---------+---------------+---------+-----------+----------+--------------+ Gastroc  Full                                                        +---------+---------------+---------+-----------+----------+--------------+   +---------+---------------+---------+-----------+----------+--------------+  LEFT     CompressibilityPhasicitySpontaneityPropertiesThrombus Aging +---------+---------------+---------+-----------+----------+--------------+ CFV      Full           Yes      Yes                                 +---------+---------------+---------+-----------+----------+--------------+ SFJ      Full                                                        +---------+---------------+---------+-----------+----------+--------------+ FV Prox  Full                                                        +---------+---------------+---------+-----------+----------+--------------+ FV Mid   Full                                                        +---------+---------------+---------+-----------+----------+--------------+ FV DistalFull                                                        +---------+---------------+---------+-----------+----------+--------------+ PFV      Full                                                        +---------+---------------+---------+-----------+----------+--------------+ POP      Full           Yes      Yes                                 +---------+---------------+---------+-----------+----------+--------------+ PTV      Full                                                        +---------+---------------+---------+-----------+----------+--------------+ PERO     Full                                                        +---------+---------------+---------+-----------+----------+--------------+ Gastroc  Full                                                         +---------+---------------+---------+-----------+----------+--------------+  Summary: RIGHT: - There is no evidence of deep vein thrombosis in the lower extremity.  - No cystic structure found in the popliteal fossa.  LEFT: - There is no evidence of deep vein thrombosis in the lower extremity.  - No cystic structure found in the popliteal fossa.  *See table(s) above for measurements and observations. Electronically signed by Deitra Mayo MD on 12/07/2021 at 12:56:56 PM.    Final    CT ABDOMEN W CONTRAST  Result Date: 12/07/2021 CLINICAL DATA:  Pelvic mass. Malignant ascites. Metastatic disease evaluation. * Tracking Code: BO * EXAM: CT ABDOMEN WITH CONTRAST TECHNIQUE: Multidetector CT imaging of the abdomen was performed using the standard protocol following bolus administration of intravenous contrast. RADIATION DOSE REDUCTION: This exam was performed according to the departmental dose-optimization program which includes automated exposure control, adjustment of the mA and/or kV according to patient size and/or use of iterative reconstruction technique. CONTRAST:  55m OMNIPAQUE IOHEXOL 350 MG/ML SOLN COMPARISON:  Pelvic MRI 12/02/2021.  Abdominopelvic CT 09/29/2005. FINDINGS: Lower chest:  Chest findings dictated separately. Hepatobiliary: There is a large heterogeneous, partially calcified mass along the superior aspect of the right hepatic lobe which is likely subcapsular in origin based on the reformatted images. This measures approximately 10.3 x 8.0 x 5.1 cm and shows heterogeneous enhancement following contrast. There may be invasion of the liver, especially by a 1.8 cm component on coronal image 45/5. No other separate hepatic lesions. No evidence of gallstones, gallbladder wall thickening or biliary dilatation. Pancreas: Unremarkable. No pancreatic ductal dilatation or surrounding inflammatory changes. Spleen: Normal in size without focal abnormality. Adrenals/Urinary Tract: Both adrenal  glands appear normal. No evidence of urinary tract calculus, suspicious renal lesion or hydronephrosis Bladder not imaged. Stomach/Bowel: No enteric contrast administered. The stomach appears unremarkable for its degree of distention. The visualized bowel demonstrates no wall thickening, distention or surrounding inflammation. The appendix appears normal. Vascular/Lymphatic: There are no enlarged abdominal lymph nodes. Aortic and branch vessel atherosclerosis without evidence of large vessel occlusion or aneurysm. The portal, superior mesenteric and splenic veins appear patent. Other: Small amount of abdominal ascites. As above, there is a large subcapsular metastasis involving the superior aspect of the liver. There is a right omental implant measuring 6.9 x 3.5 cm on image 28/2. There is a very large central mass involving the upper abdomen and pelvis, incompletely visualized by this CT of the abdomen, but measuring up to 16.5 x 12.2 cm on image 37/2. This was seen on recent pelvic MRI. Musculoskeletal: No acute or significant osseous findings. IMPRESSION: 1. Large central abdominal and pelvic mass consistent with known malignancy. This is incompletely visualized by this CT of the abdomen, but was seen on recent pelvic MRI. 2. 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 Right omental implant consistent with metastatic disease. Small amount of abdominal ascites. 3. No evidence for bowel or ureteral obstruction. 4. Aortic Atherosclerosis (ICD10-I70.0). 5. Chest findings dictated separately. Electronically Signed   By: WRichardean SaleM.D.   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  Cough. EXAM: CT ANGIOGRAPHY CHEST WITH CONTRAST TECHNIQUE: Multidetector CT imaging of the chest was performed using the standard protocol  during bolus administration of intravenous contrast. Multiplanar CT image reconstructions and MIPs were obtained to evaluate the vascular anatomy. RADIATION DOSE REDUCTION: This exam was performed  according to the departmental dose-optimization program which includes automated exposure control, adjustment of the mA and/or kV according to patient size and/or use of iterative reconstruction technique. CONTRAST:  57m OMNIPAQUE IOHEXOL 350 MG/ML SOLN COMPARISON:  CT Abdomen today reported separately. FINDINGS: Cardiovascular: Excellent contrast bolus timing in the pulmonary arterial tree. Mild respiratory motion. No pulmonary artery filling defect. No calcified coronary artery atherosclerosis is evident. Negative visible aorta. No cardiomegaly or pericardial effusion. Mediastinum/Nodes: No mediastinal mass or lymphadenopathy. Lungs/Pleura: Major airways are patent. Both lungs are clear except for minor dependent atelectasis. No pulmonary nodule, mass, pleural effusion, consolidation. Upper Abdomen: Reported separately today, liver mass and ascites are visible. Musculoskeletal: No acute or suspicious osseous lesion in the chest. Review of the MIP images confirms the above findings. IMPRESSION: 1. Negative for pulmonary embolism. And no acute or metastatic process identified in the Chest. 2. Partially visible liver mass and ascites, staging CT Abdomen today is reported separately. Electronically Signed   By: HGenevie AnnM.D.   On: 12/07/2021 10:54   MR PELVIS W WO CONTRAST  Result Date: 12/02/2021 CLINICAL DATA:  Pelvic mass for 3 months. EXAM: MRI PELVIS WITHOUT AND WITH CONTRAST TECHNIQUE: Multiplanar multisequence MR imaging of the pelvis was performed both before and after administration of intravenous contrast. CONTRAST:  5 cc Vueway COMPARISON:  None Available. FINDINGS: Urinary Tract:  Unremarkable where included Bowel: Bowel is displaced by the large abdominopelvic tumor. Otherwise unremarkable.  Vascular/Lymphatic: Flattening of the IVC at the level of the aortic bifurcation for example on image 34 series 12 due to the abdominopelvic mass. No findings of IVC thrombus or pelvic DVT. Reproductive: Multilobulated and heterogeneously enhancing 21.1 by 10.9 by 18.7 cm (volume = 2250 cm^3) mass is present with composed opponents abutting both adnexa. A satellite mass in the right upper quadrant anteriorly measures 6.9 by 4.1 by 6.4 cm (volume = 95 cm^3) on image 15 of series 4. There is moderate ascites with tumor deposits along the pelvic ascites posteriorly for example on images 79 through 87 of series 16. The mass primarily extends above the uterus, and could be arising from the right ovary, left ovary, or both. There is abnormal enhancement along ascites margins in the paracolic gutter most compatible with malignant ascites. Today's exam was not meant to include the upper abdomen as this is an MRI of the pelvis. However, coronal images include enough of upper abdomen to indicate that there is likely a at least 7 cm tumor deposit just above the liver (image 14, series 2). Other:  No supplemental non-categorized findings. Musculoskeletal: Low-level edema along the lateral abdominal wall musculature for example on image 6 series 4, otherwise unremarkable. IMPRESSION: 1. 21.1 by 10.9 by 18.7 cm abdominopelvic mass, with a satellite mass in the right upper quadrant anteriorly, and moderate ascites with tumor deposits along the pelvic ascites posteriorly. The mass could be arising from right or left ovary, or both. The mass displaces the bowel and IVC, but there is no IVC thrombus or pelvic DVT identified. 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 2. Additional satellite mass above the liver is partially included, along with tumor deposits along the ascites. 3. Low-level edema along the lateral abdominal wall musculature. 4. Flattening of the IVC at the level of the aortic  bifurcation due to the large abdominopelvic tumor. No findings of pelvic DVT. Electronically Signed   By: WVan ClinesM.D.   On: 12/02/2021 16:31

## 2021-12-31 NOTE — Assessment & Plan Note (Signed)
From treatment perspective, she has developed severe pancytopenia The cause of her fever is unknown Tumor fever or neutropenic fever from infection cannot be excluded although she does not have any localizing signs apart from sinus congestion Continue supportive care

## 2021-12-31 NOTE — Assessment & Plan Note (Signed)
She has developed neutropenic fever She has no localizing signs apart from sinus congestion We discussed the risk and benefits of admission versus outpatient management with oral antibiotics Chest x-ray is unremarkable Urinalysis is unremarkable Urine culture is pending Blood cultures pending, so far preliminary blood cultures negative Ultimately, the patient, elected for outpatient management She has started on Augmentin and so far did not have any fever greater than 101 I will call and check on her on Monday She has appointment scheduled for Tuesday

## 2021-12-31 NOTE — Assessment & Plan Note (Signed)
She has acquired pancytopenia due to treatment Her tachycardia could be due to symptomatic anemia or related to her disease She does not need transfusion support today There is no contraindication to remain on antiplatelet agents or anticoagulants as long as the platelet is greater than 50,000.

## 2022-01-01 ENCOUNTER — Other Ambulatory Visit: Payer: Self-pay

## 2022-01-03 ENCOUNTER — Other Ambulatory Visit: Payer: Self-pay

## 2022-01-03 ENCOUNTER — Other Ambulatory Visit: Payer: Self-pay | Admitting: Hematology and Oncology

## 2022-01-03 ENCOUNTER — Emergency Department (HOSPITAL_BASED_OUTPATIENT_CLINIC_OR_DEPARTMENT_OTHER): Payer: 59 | Admitting: Radiology

## 2022-01-03 ENCOUNTER — Emergency Department (HOSPITAL_BASED_OUTPATIENT_CLINIC_OR_DEPARTMENT_OTHER): Payer: 59

## 2022-01-03 ENCOUNTER — Encounter (HOSPITAL_BASED_OUTPATIENT_CLINIC_OR_DEPARTMENT_OTHER): Payer: Self-pay | Admitting: Emergency Medicine

## 2022-01-03 ENCOUNTER — Inpatient Hospital Stay (HOSPITAL_BASED_OUTPATIENT_CLINIC_OR_DEPARTMENT_OTHER)
Admission: EM | Admit: 2022-01-03 | Discharge: 2022-01-08 | DRG: 809 | Disposition: A | Discharge status: Other Acute Inpatient Hospital | Payer: 59 | Source: Ambulatory Visit | Attending: Internal Medicine | Admitting: Internal Medicine

## 2022-01-03 ENCOUNTER — Telehealth: Payer: Self-pay | Admitting: Hematology and Oncology

## 2022-01-03 ENCOUNTER — Encounter (HOSPITAL_COMMUNITY): Payer: Self-pay

## 2022-01-03 DIAGNOSIS — C563 Malignant neoplasm of bilateral ovaries: Secondary | ICD-10-CM | POA: Diagnosis present

## 2022-01-03 DIAGNOSIS — D649 Anemia, unspecified: Secondary | ICD-10-CM | POA: Diagnosis present

## 2022-01-03 DIAGNOSIS — Z8249 Family history of ischemic heart disease and other diseases of the circulatory system: Secondary | ICD-10-CM

## 2022-01-03 DIAGNOSIS — G893 Neoplasm related pain (acute) (chronic): Secondary | ICD-10-CM | POA: Diagnosis present

## 2022-01-03 DIAGNOSIS — F411 Generalized anxiety disorder: Secondary | ICD-10-CM | POA: Diagnosis present

## 2022-01-03 DIAGNOSIS — R6510 Systemic inflammatory response syndrome (SIRS) of non-infectious origin without acute organ dysfunction: Secondary | ICD-10-CM | POA: Diagnosis present

## 2022-01-03 DIAGNOSIS — R531 Weakness: Secondary | ICD-10-CM | POA: Diagnosis not present

## 2022-01-03 DIAGNOSIS — D709 Neutropenia, unspecified: Secondary | ICD-10-CM | POA: Diagnosis present

## 2022-01-03 DIAGNOSIS — E860 Dehydration: Secondary | ICD-10-CM | POA: Diagnosis present

## 2022-01-03 DIAGNOSIS — C569 Malignant neoplasm of unspecified ovary: Secondary | ICD-10-CM

## 2022-01-03 DIAGNOSIS — A419 Sepsis, unspecified organism: Secondary | ICD-10-CM | POA: Diagnosis not present

## 2022-01-03 DIAGNOSIS — R5081 Fever presenting with conditions classified elsewhere: Secondary | ICD-10-CM | POA: Diagnosis present

## 2022-01-03 DIAGNOSIS — C787 Secondary malignant neoplasm of liver and intrahepatic bile duct: Secondary | ICD-10-CM | POA: Diagnosis present

## 2022-01-03 DIAGNOSIS — Z803 Family history of malignant neoplasm of breast: Secondary | ICD-10-CM | POA: Diagnosis not present

## 2022-01-03 DIAGNOSIS — Z20822 Contact with and (suspected) exposure to covid-19: Secondary | ICD-10-CM | POA: Diagnosis present

## 2022-01-03 DIAGNOSIS — C562 Malignant neoplasm of left ovary: Secondary | ICD-10-CM | POA: Diagnosis not present

## 2022-01-03 DIAGNOSIS — R180 Malignant ascites: Secondary | ICD-10-CM | POA: Diagnosis present

## 2022-01-03 DIAGNOSIS — K219 Gastro-esophageal reflux disease without esophagitis: Secondary | ICD-10-CM | POA: Diagnosis present

## 2022-01-03 DIAGNOSIS — Z833 Family history of diabetes mellitus: Secondary | ICD-10-CM | POA: Diagnosis not present

## 2022-01-03 DIAGNOSIS — E46 Unspecified protein-calorie malnutrition: Secondary | ICD-10-CM | POA: Diagnosis not present

## 2022-01-03 DIAGNOSIS — T451X5A Adverse effect of antineoplastic and immunosuppressive drugs, initial encounter: Secondary | ICD-10-CM | POA: Diagnosis present

## 2022-01-03 DIAGNOSIS — Z79899 Other long term (current) drug therapy: Secondary | ICD-10-CM

## 2022-01-03 DIAGNOSIS — E440 Moderate protein-calorie malnutrition: Secondary | ICD-10-CM | POA: Diagnosis present

## 2022-01-03 DIAGNOSIS — Z823 Family history of stroke: Secondary | ICD-10-CM | POA: Diagnosis not present

## 2022-01-03 DIAGNOSIS — E876 Hypokalemia: Secondary | ICD-10-CM | POA: Diagnosis present

## 2022-01-03 DIAGNOSIS — Z6824 Body mass index (BMI) 24.0-24.9, adult: Secondary | ICD-10-CM

## 2022-01-03 DIAGNOSIS — Z7901 Long term (current) use of anticoagulants: Secondary | ICD-10-CM

## 2022-01-03 DIAGNOSIS — Z86711 Personal history of pulmonary embolism: Secondary | ICD-10-CM

## 2022-01-03 DIAGNOSIS — D701 Agranulocytosis secondary to cancer chemotherapy: Principal | ICD-10-CM | POA: Diagnosis present

## 2022-01-03 DIAGNOSIS — D61818 Other pancytopenia: Secondary | ICD-10-CM

## 2022-01-03 DIAGNOSIS — T368X5A Adverse effect of other systemic antibiotics, initial encounter: Secondary | ICD-10-CM | POA: Diagnosis present

## 2022-01-03 DIAGNOSIS — Z807 Family history of other malignant neoplasms of lymphoid, hematopoietic and related tissues: Secondary | ICD-10-CM | POA: Diagnosis not present

## 2022-01-03 DIAGNOSIS — C801 Malignant (primary) neoplasm, unspecified: Secondary | ICD-10-CM | POA: Diagnosis not present

## 2022-01-03 DIAGNOSIS — K521 Toxic gastroenteritis and colitis: Secondary | ICD-10-CM | POA: Diagnosis present

## 2022-01-03 LAB — RETICULOCYTES
Immature Retic Fract: 12.3 % (ref 2.3–15.9)
RBC.: 2.19 MIL/uL — ABNORMAL LOW (ref 3.87–5.11)
Retic Count, Absolute: 13.4 10*3/uL — ABNORMAL LOW (ref 19.0–186.0)
Retic Ct Pct: 0.6 % (ref 0.4–3.1)

## 2022-01-03 LAB — CBC WITH DIFFERENTIAL/PLATELET
Abs Immature Granulocytes: 0.1 10*3/uL — ABNORMAL HIGH (ref 0.00–0.07)
Basophils Absolute: 0 10*3/uL (ref 0.0–0.1)
Basophils Relative: 0 %
Blasts: 2 %
Eosinophils Absolute: 0 10*3/uL (ref 0.0–0.5)
Eosinophils Relative: 0 %
HCT: 22.3 % — ABNORMAL LOW (ref 36.0–46.0)
Hemoglobin: 7.3 g/dL — ABNORMAL LOW (ref 12.0–15.0)
Lymphocytes Relative: 51 %
Lymphs Abs: 0.8 10*3/uL (ref 0.7–4.0)
MCH: 28.1 pg (ref 26.0–34.0)
MCHC: 32.7 g/dL (ref 30.0–36.0)
MCV: 85.8 fL (ref 80.0–100.0)
Metamyelocytes Relative: 1 %
Monocytes Absolute: 0.3 10*3/uL (ref 0.1–1.0)
Monocytes Relative: 23 %
Myelocytes: 1 %
Neutro Abs: 0.3 10*3/uL — CL (ref 1.7–7.7)
Neutrophils Relative %: 20 %
Platelets: 209 10*3/uL (ref 150–400)
Promyelocytes Relative: 2 %
RBC: 2.6 MIL/uL — ABNORMAL LOW (ref 3.87–5.11)
RDW: 14.5 % (ref 11.5–15.5)
WBC: 1.5 10*3/uL — ABNORMAL LOW (ref 4.0–10.5)
nRBC: 0 % (ref 0.0–0.2)
nRBC: 1 /100 WBC — ABNORMAL HIGH

## 2022-01-03 LAB — RESP PANEL BY RT-PCR (FLU A&B, COVID) ARPGX2
Influenza A by PCR: NEGATIVE
Influenza B by PCR: NEGATIVE
SARS Coronavirus 2 by RT PCR: NEGATIVE

## 2022-01-03 LAB — COMPREHENSIVE METABOLIC PANEL
ALT: 12 U/L (ref 0–44)
AST: 11 U/L — ABNORMAL LOW (ref 15–41)
Albumin: 3.3 g/dL — ABNORMAL LOW (ref 3.5–5.0)
Alkaline Phosphatase: 135 U/L — ABNORMAL HIGH (ref 38–126)
Anion gap: 13 (ref 5–15)
BUN: 11 mg/dL (ref 6–20)
CO2: 27 mmol/L (ref 22–32)
Calcium: 9.3 mg/dL (ref 8.9–10.3)
Chloride: 98 mmol/L (ref 98–111)
Creatinine, Ser: 0.53 mg/dL (ref 0.44–1.00)
GFR, Estimated: >60 mL/min (ref >60)
Glucose, Bld: 108 mg/dL — ABNORMAL HIGH (ref 70–99)
Potassium: 3.6 mmol/L (ref 3.5–5.1)
Sodium: 138 mmol/L (ref 135–145)
Total Bilirubin: 0.6 mg/dL (ref 0.3–1.2)
Total Protein: 6.7 g/dL (ref 6.5–8.1)

## 2022-01-03 LAB — URINALYSIS, ROUTINE W REFLEX MICROSCOPIC
Bilirubin Urine: NEGATIVE
Glucose, UA: NEGATIVE mg/dL
Ketones, ur: NEGATIVE mg/dL
Leukocytes,Ua: NEGATIVE
Nitrite: NEGATIVE
Protein, ur: 100 mg/dL — AB
Specific Gravity, Urine: >1.046 — ABNORMAL HIGH (ref 1.005–1.030)
pH: 6 (ref 5.0–8.0)

## 2022-01-03 LAB — LACTIC ACID, PLASMA
Lactic Acid, Venous: 1.2 mmol/L (ref 0.5–1.9)
Lactic Acid, Venous: 1.1 mmol/L (ref 0.5–1.9)

## 2022-01-03 LAB — TSH: TSH: 1.13 u[IU]/mL (ref 0.350–4.500)

## 2022-01-03 LAB — PROCALCITONIN: Procalcitonin: 3.37 ng/mL

## 2022-01-03 LAB — IRON AND TIBC
Iron: 9 ug/dL — ABNORMAL LOW (ref 28–170)
Saturation Ratios: 9 % — ABNORMAL LOW (ref 10.4–31.8)
TIBC: 106 ug/dL — ABNORMAL LOW (ref 250–450)
UIBC: 97 ug/dL

## 2022-01-03 LAB — FERRITIN: Ferritin: 826 ng/mL — ABNORMAL HIGH (ref 11–307)

## 2022-01-03 MED ORDER — LORAZEPAM 2 MG/ML IJ SOLN: 0.5000 mg | Freq: Three times a day (TID) | INTRAMUSCULAR | Status: DC | PRN

## 2022-01-03 MED ORDER — ACETAMINOPHEN 325 MG PO TABS
650.0000 mg | ORAL_TABLET | Freq: Once | ORAL | Status: AC
  Administered 2022-01-03: 650 mg via ORAL
  Filled 2022-01-03: qty 2

## 2022-01-03 MED ORDER — SODIUM CHLORIDE 0.9 % IV SOLN
INTRAVENOUS | Status: DC | PRN
  Administered 2022-01-03: 10 mL via INTRAVENOUS

## 2022-01-03 MED ORDER — LORAZEPAM 0.5 MG PO TABS
0.5000 mg | ORAL_TABLET | Freq: Four times a day (QID) | ORAL | Status: DC | PRN
  Administered 2022-01-03 – 2022-01-05 (×3): 0.5 mg via ORAL
  Filled 2022-01-03 (×3): qty 1

## 2022-01-03 MED ORDER — ONDANSETRON HCL 4 MG/2ML IJ SOLN: 4.0000 mg | Freq: Four times a day (QID) | INTRAMUSCULAR | Status: DC | PRN

## 2022-01-03 MED ORDER — ACETAMINOPHEN 650 MG RE SUPP: 650.0000 mg | Freq: Four times a day (QID) | RECTAL | Status: DC | PRN

## 2022-01-03 MED ORDER — ACETAMINOPHEN 325 MG PO TABS
650.0000 mg | ORAL_TABLET | Freq: Four times a day (QID) | ORAL | Status: DC | PRN
  Administered 2022-01-04 – 2022-01-06 (×4): 650 mg via ORAL
  Filled 2022-01-03 (×4): qty 2

## 2022-01-03 MED ORDER — MELATONIN 3 MG PO TABS: 3.0000 mg | ORAL_TABLET | Freq: Every evening | ORAL | Status: DC | PRN

## 2022-01-03 MED ORDER — DIPHENHYDRAMINE HCL 25 MG PO CAPS
25.0000 mg | ORAL_CAPSULE | Freq: Every evening | ORAL | Status: DC | PRN
  Filled 2022-01-03: qty 1

## 2022-01-03 MED ORDER — SODIUM CHLORIDE 0.9 % IV SOLN
2.0000 g | Freq: Three times a day (TID) | INTRAVENOUS | Status: DC
  Administered 2022-01-03 – 2022-01-06 (×9): 2 g via INTRAVENOUS
  Filled 2022-01-03 (×9): qty 12.5

## 2022-01-03 MED ORDER — IOHEXOL 350 MG/ML SOLN
100.0000 mL | Freq: Once | INTRAVENOUS | Status: AC | PRN
  Administered 2022-01-03: 65 mL via INTRAVENOUS

## 2022-01-03 MED ORDER — SODIUM CHLORIDE 0.9 % IV BOLUS
1000.0000 mL | Freq: Once | INTRAVENOUS | Status: AC
  Administered 2022-01-03: 1000 mL via INTRAVENOUS

## 2022-01-03 MED ORDER — LACTATED RINGERS IV SOLN: INTRAVENOUS | Status: DC

## 2022-01-03 MED ORDER — LACTATED RINGERS IV BOLUS: 1000.0000 mL | Freq: Once | INTRAVENOUS | Status: DC

## 2022-01-03 NOTE — ED Provider Notes (Signed)
[facility] EMERGENCY DEPT Provider Note   CSN: [id number] Arrival date & time: 01/03/22  8413     History  Chief Complaint  Patient presents with   Fever    ALEECE LOYD is a 51 y.o. female, history of ovarian germ cell tumor, who presents to the ED secondary to request from her oncologist.  She has been having a fever for the last 5 days after completing a partial round of chemo.  She notes that the highest her fever has been has been 101.7 which was this AM.  Notes that she has had some shortness of breath and abdominal pain, which she states is chronic for her.  Notes that the only new symptom that she has had has been sinus congestion, has seen her oncologist last Friday, who did a work-up which was negative for any kind of infection, and she was placed on Augmentin.  She states her fevers are persistent, and she just feels unwell.  Took a Tylenol around 4 AM this morning when her temp was elevated.     Home Medications Prior to Admission medications   Medication Sig Start Date End Date Taking? Authorizing Provider  amoxicillin-clavulanate (AUGMENTIN) 875-125 MG tablet Take 1 tablet by mouth 2 (two) times daily. 12/30/21   Heath Lark, MD  apixaban (ELIQUIS) 2.5 MG TABS tablet Take 1 tablet (2.5 mg total) by mouth 2 (two) times daily. Wait to start this until after you have had your procedures 12/07/21   Lafonda Mosses, MD  dexamethasone (DECADRON) 4 MG tablet Take 2 tablets (8 mg total) by mouth daily. Start the day after last cisplatin dose on day 6 of chemo for 3 days. Take with food. 12/10/21   Heath Lark, MD  famotidine (PEPCID) 10 MG tablet Take 10 mg by mouth daily as needed for heartburn or indigestion.    [provider]  furosemide (LASIX) 20 MG tablet Take 1 tablet (20 mg total) by mouth daily for 5 days. 12/23/21 12/28/21  Barrie Folk, PA-C  lidocaine-prilocaine (EMLA) cream Apply to affected area once 12/10/21   Heath Lark, MD   loratadine (CLARITIN) 10 MG tablet Take 10 mg by mouth daily as needed for allergies.    [provider]  LORazepam (ATIVAN) 0.5 MG tablet Take 1 tablet (0.5 mg total) by mouth every 8 (eight) hours. 12/10/21   Heath Lark, MD  ondansetron (ZOFRAN) 8 MG tablet Take 1 tablet (8 mg total) by mouth every 8 (eight) hours as needed for nausea or vomiting. Start on the third day after last cisplatin dose on day 8 of chemo 12/10/21   Heath Lark, MD  prochlorperazine (COMPAZINE) 10 MG tablet Take 1 tablet (10 mg total) by mouth every 6 (six) hours as needed for nausea or vomiting. 12/10/21   Heath Lark, MD      Allergies    Latex, Sulfa antibiotics, Sulfamethoxazole-trimethoprim, and Septra [bactrim]    Review of Systems   Review of Systems  Constitutional:  Positive for fever.  Cardiovascular:  Negative for chest pain.  Genitourinary:  Negative for difficulty urinating and vaginal discharge.    Physical Exam Updated Vital Signs BP 127/75   Pulse (!) 127   Temp (!) 101.3 F (38.5 C) (Oral)   Resp 18   LMP  (LMP Unknown) Comment: tubal ligation  SpO2 97%  Physical Exam Vitals and nursing note reviewed.  Constitutional:      General: She is not in acute distress.    Appearance:  She is well-developed.  HENT:     Head: Normocephalic and atraumatic.  Eyes:     Conjunctiva/sclera: Conjunctivae normal.  Cardiovascular:     Rate and Rhythm: Normal rate and regular rhythm.     Heart sounds: No murmur heard. Pulmonary:     Effort: Pulmonary effort is normal. No respiratory distress.     Breath sounds: Normal breath sounds.  Abdominal:     Palpations: Abdomen is soft.     Tenderness: There is abdominal tenderness.     Comments: Mild diffuse ttp and slight distension.  Musculoskeletal:        General: No swelling.     Cervical back: Neck supple.  Skin:    General: Skin is warm and dry.     Capillary Refill: Capillary refill takes less than 2 seconds.  Neurological:      Mental Status: She is alert.  Psychiatric:        Mood and Affect: Mood normal.     ED Results / Procedures / Treatments   Labs (all labs ordered are listed, but only abnormal results are displayed) Labs Reviewed  CBC WITH DIFFERENTIAL/PLATELET - Abnormal; Notable for the following components:      Result Value   WBC 1.5 (*)    RBC 2.60 (*)    Hemoglobin 7.3 (*)    HCT 22.3 (*)    Neutro Abs 0.3 (*)    nRBC 1 (*)    Abs Immature Granulocytes 0.10 (*)    All other components within normal limits  COMPREHENSIVE METABOLIC PANEL - Abnormal; Notable for the following components:   Glucose, Bld 108 (*)    Albumin 3.3 (*)    AST 11 (*)    Alkaline Phosphatase 135 (*)    All other components within normal limits  URINALYSIS, ROUTINE W REFLEX MICROSCOPIC - Abnormal; Notable for the following components:   Specific Gravity, Urine >1.046 (*)    Hgb urine dipstick TRACE (*)    Protein, ur 100 (*)    All other components within normal limits  RESP PANEL BY RT-PCR (FLU A&B, COVID) ARPGX2  CULTURE, BLOOD (ROUTINE X 2)  CULTURE, BLOOD (ROUTINE X 2)  PATHOLOGIST SMEAR REVIEW  LACTIC ACID, PLASMA  LACTIC ACID, PLASMA  PROCALCITONIN    EKG None  Radiology CT ABDOMEN PELVIS W CONTRAST  Result Date: 01/03/2022 CLINICAL DATA:  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 Diffuse abdominal swelling. Fever for 6 days. * Tracking Code: BO * EXAM: CT ABDOMEN AND PELVIS WITH CONTRAST TECHNIQUE: Multidetector CT imaging of the abdomen and pelvis was performed using the standard protocol following bolus administration of intravenous contrast. RADIATION DOSE REDUCTION: This exam was performed according to the departmental dose-optimization program which includes automated exposure control, adjustment of the mA and/or kV according to patient size and/or use of iterative reconstruction technique. CONTRAST:  7m OMNIPAQUE IOHEXOL 350 MG/ML SOLN COMPARISON:   11/17/2021 FINDINGS: Lower chest: Unremarkable Hepatobiliary: Heterogeneous likely subcapsular mass in the dome of the liver primarily of the right hepatic lobe with speckled internal calcifications, measuring 10.8 by 5.3 by 8.7 cm (volume = 260 cm^3), previously 9.6 by 5.2 by 8.8 cm (volume = 230 cm^3) on 12/07/2021. Lobularity along the margin with the hepatic parenchyma suggests hepatic invasion. Contracted gallbladder. Perihepatic ascites noted. No new hepatic lesion observed. No biliary dilatation. Pancreas: Unremarkable Spleen: Unremarkable Adrenals/Urinary Tract: Suspected 2 mm left kidney lower pole nonobstructive renal calculus, image 33 series 2. Adrenal glands unremarkable.  No hydronephrosis. Urinary bladder unremarkable. Stomach/Bowel: Displacement of bowel loops by the large central abdominopelvic tumor. No current dilated bowel. Vascular/Lymphatic: Mild abdominal aortic atherosclerotic vascular disease. Reproductive: Tracking above and around the uterus and filling a substantial portion of the abdominopelvic cavity, we demonstrate a 24.8 by 15.1 by 17.6 cm (volume = 3450 cm^3) heterogeneous mass with scattered calcifications compatible with malignancy. Prior CT abdomen did not include the pelvis and accordingly size wise this is difficult to compare. However, the abdominal component is similar to previous. This may be arising from the ovaries with slight eccentricity to the right in the pelvis. Other: Scattered malignant ascites. A cystic and solid right omental deposit measures 9.3 by 4.3 cm on image 47 series 2, formerly 8.4 by 3.7 cm. There stranding and some mild nodularity along the rest of the omentum. Perihepatic and perisplenic ascites noted along with ascites tracking in the paracolic gutter and in the pelvis. Musculoskeletal: Unremarkable IMPRESSION: 1. Large central abdominopelvic mass compatible with malignancy, 3450 cubic cm in volume. This may be arising from the ovaries with slight  eccentricity to the right in the pelvis. 2. Mild increase in size of the right omental tumor deposit. 3. Mild increase in size of the subcapsular mass along the dome of the right hepatic lobe, with suspected hepatic invasion. 4. Scattered malignant ascites. 5. No dilated bowel to suggest obstruction. 6. 2 mm left kidney lower pole nonobstructive renal calculus. 7. Mild abdominal aortic atherosclerotic vascular disease. Aortic Atherosclerosis (ICD10-I70.0). Electronically Signed   By: Van Clines M.D.   On: 01/03/2022 13:52   DG Chest 1 View  Result Date: 01/03/2022 CLINICAL DATA:  Fever of unknown origin. EXAM: CHEST  1 VIEW COMPARISON:  12/30/2021 FINDINGS: There is a right chest wall port a catheter with tip in the projection of the SVC. Normal heart size. No pleural effusion or edema. No airspace opacities identified. Visualized osseous structures are unremarkable. IMPRESSION: No active disease. Electronically Signed   By: Kerby Moors M.D.   On: 01/03/2022 11:32    Procedures Procedures    Medications Ordered in ED Medications  ceFEPIme (MAXIPIME) 2 g in sodium chloride 0.9 % 100 mL IVPB (0 g Intravenous Stopped 01/03/22 1302)  0.9 %  sodium chloride infusion (0 mLs Intravenous Stopped 01/03/22 1302)  sodium chloride 0.9 % bolus 1,000 mL (1,000 mLs Intravenous New Bag/Given 01/03/22 1312)  iohexol (OMNIPAQUE) 350 MG/ML injection 100 mL (65 mLs Intravenous Contrast Given 01/03/22 1337)  acetaminophen (TYLENOL) tablet 650 mg (650 mg Oral Given 01/03/22 1424)    ED Course/ Medical Decision Making/ A&P                           Medical Decision Making Patient is a 51 year old female, here for further evaluation upon the advisement of her oncologist.  She has had a fever for the last week or so after starting partial chemo.  Her oncologist did a sepsis work-up on her last Friday, and started her on Augmentin for slight nasal congestion, but she states she is persistently having  fevers.  We will obtain septic work-up, is tachycardic on exam, but states she is not more short of breath than usual, or having chest pain.  Consulted with patient's oncologist Dr. Alvy Bimler, she would like a CT abdomen pelvis to evaluate for worsening disease.  Will give IV fluids for patient given tachycardia, concern for dehydration.  Give cefepime given concern for infection.  Amount and/or Complexity  of Data Reviewed Labs: ordered.    Details: Labs show neutropenia, UA is negative Radiology: ordered.    Details: CT abdomen/pelvis is concerning for worsening disease.  Chest x-ray negative for any disease. Discussion of management or test interpretation with external provider(s): Discussed with patient, worsening disease, very upset after being informed, which is understandable.  Discussed importance of admission, she is agreeable to this.  Discussed with Dr. Alvy Bimler, will evaluate patient inpatient.  Admitted to Dr. Marlyce Huge to PCU.  Neutropenic fever concerns.  He requested that provider place procalcitonin, and lactate orders.  Orders placed.  Patient being admitted secondary to fever refractory to outpatient antibiotics, need for inpatient hospitalization, and worsening cancer.  Risk OTC drugs. Prescription drug management. Decision regarding hospitalization.    Final Clinical Impression(s) / ED Diagnoses Final diagnoses:  Neutropenic fever [facility])    Rx / DC Orders ED Discharge Orders     None         Jaelyn Cloninger, Si Gaul, PA 01/03/22 1545    Gareth Morgan, MD 01/04/22 0021

## 2022-01-03 NOTE — Progress Notes (Signed)
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 Alvy Bimler.  Patient presented to [facility] with ongoing fever for the past 6 days.  In the days preceding patient's presentation Dr. Alvy Bimler attempted infectious work-up while managing patient with oral Augmentin outpatient cultures have been negative thus far.  Despite outpatient antibiotics patient continued to clinically worsen prompting her presentation to [facility] emergency department.  Upon evaluation in the emergency department, patient has been found to be neutropenic and leukopenic with multiple SIRS criteria including temp 101.7.  EDP discussed case with Dr. Alvy Bimler who recommended admission, infectious work-up including repeat CT imaging of the abdomen pelvis and initiation of intravenous antibiotics.  The hospitalist group has now been called and patient is being accepted for transfer to [facility] long hospital for continued medical care.  Plan of care: The patient is accepted for admission to Progressive unit, at [facility] or first available campus pending bed availability.  Author: Vernelle Emerald, MD  01/03/2022  Check [email or web address] for on-call coverage.  Nursing staff, Please call [facility] number on Amion as soon as patient's arrival, so appropriate admitting provider can evaluate the pt.

## 2022-01-03 NOTE — H&P (Signed)
History and Physical      [name] IOM:[id number] DOB: Jun 05, 1970 DOA: 01/03/2022  PCP: Chesley Noon, MD  Patient coming from: home   I have personally briefly reviewed patient's old medical records in [place]  Chief Complaint: Fever  HPI: [name] is a 51 y.o. 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  She has subsequently established with Dr. Alvy Bimler Of oncology.  Subsequently started on chemotherapy, with most recent chemotherapy session having occurred on 12/28/2021.   She presents to [facility] emergency department today complaining of 6 days of objective fever, with temperature max over that timeframe noted We will 1.7, occurring on the morning of 01/03/2022.  Last week, she was started on Augmentin by Dr. Alvy Bimler  shortly after development of her objective fever.  No source of the infection was identified at that time, and the patient reports persistence of her objective fevers in spite of good interval compliance on this Augmentin.  Over the last 5 to 6 days, she has noted rhinitis, rhinorrhea, without any associated cough, shortness of breath.  Denies any recent headache, neck stiffness, rash, dysuria, gross hematuria.  She notes chronic abdominal discomfort in the diffuse distribution, and conveys no significant change in the distribution or intensity of this over the last week.  No recent diarrhea, melena, hematochezia.  Denies any recent chest pain, palpitations, diaphoresis.  Over the last week she is also  noted generalized weakness in the absence of any acute focal weakness.  In the setting of persistence of her objective fevers, she presents to [facility] emergency department today for further evaluation management thereof.  To chart review, previous hemoglobin data points were notable for the following: Hemoglobin on 12/07/2021 was 12.4; 10.3 on 12/16/2021, 7.9 on 12/30/2021, 8.1 on 12/31/2021.    [facility] ED Course:  Vital signs in the ED were notable for the following: Damage max 101.3; initial rates in the 120s consistently improving into the 110s following interval IV fluids; systolic blood pressures in the 110s 130s; respiratory rate 15-18, oxygen saturation 95 to 100% on room air.  Labs were notable for the following: CMP notable for the following: Sodium 138, bicarbonate 27, creatinine 0.53, calcium, adjusted for mild hypoalbuminemia noted to be 9.9, avidin 3.3, alkaline phosphatase 135, otherwise liver enzymes within normal limits.  Initial lactate 1.2, with repeat value trending down to 1.1.  Procalcitonin 3.37.  CBC notable for white cell count 1500 with absolute neutrophil count of 300, which is compared to will with cell count of 300 on 12/31/2021 at which time she had a absolute neutrophil count of 100; hemoglobin 7.3 associated with normocytic/normochromic findings as well as nonelevated RDW.  Additionally, CBC today notable for 0.3 monocytes, up from 0.0 on 12/31/2021.  Technologist smear review was ordered at [facility], with result currently pending.  Urinalysis showed no white blood cells, leukocyte Estrace/nitrate negative,with no cells, and specific gravity greater than 1.046.  COVID-19/influenza PCR negative, blood cultures x2 collected prior to initiation of IV antibiotics, as further detailed below.  Imaging and additional notable ED work-up: Per radiology  review, CT abdomen/pelvis with contrast, in comparison to imaging from 11/17/2021 showed large central abdominopelvic mass  consistent with malignancy appearing to arise from the ovaries; mild interval increase in size of right omental tumor deposit; mild interval increase in size of subcapsular mass along the dome of the right hepatic lobe; scattered malignant ascites; no evidence of bowel obstruction, abscess, perforation, or bowel wall inflammation.  EDP discussed patient's case with her oncologist, Dr. Alvy Bimler,, Who requested admission to the hospitalist service at [facility] for further evaluation management of neutropenic fever, including initiation of IV antibiotics.  Dr. Alvy Bimler to consult, with additional recommendations to follow.   While in the ED, the following were administered: Acetaminophen 650 mg p.o. x1, cefepime 2 g IV; will symptoms within bolus, lactated Ringer's x1 L bolus.  Subsequently, the patient was admitted to [facility] for further evaluation and management of neutropenic fever.    Review of Systems: As per HPI otherwise 10 point review of systems negative.   Past Medical History:  Diagnosis Date   Arthritis    Endometriosis    Family history of breast cancer in mother    at age 60   GAD (generalized anxiety disorder)    lexapro helped 2017 (Dr. Hoy Finlay did have extra stress of the Master's degree program on her at that time.  Got counseling.       GERD (gastroesophageal reflux disease)    Gestational diabetes    Kidney stones    Low back pain    Dr. Charlann Boxer.   Neck pain    and left shoulder pain: managed by osteopathic manipulation by Dr. Charlann Boxer. ?cervical radiculopathy   PAC (premature atrial contraction)     Past Surgical History:  Procedure Laterality Date   ABDOMINAL EXPLORATION SURGERY  2009   fulguration of endometriosis - [facility] surgery   IR IMAGING GUIDED PORT INSERTION  12/08/2021   IR US GUIDE BX ASP/DRAIN  12/08/2021   SALPINGECTOMY Right 2009   TONSILLECTOMY     TUBAL LIGATION Left 2009    Social History:  reports that she has never smoked. She has  never used smokeless tobacco. She reports current alcohol use. She reports that she does not use drugs.   Allergies  Allergen Reactions   Latex Other (See Comments)    Sensitivity only   Sulfa Antibiotics Hives and Other (See Comments)   Sulfamethoxazole-Trimethoprim Rash   Septra [Bactrim] Rash    Family History  Problem Relation Age of Onset   Rheum arthritis Mother    Stroke Mother    Hypertension Mother    Heart disease Mother    Arthritis Mother    Breast cancer Mother    Heart attack Mother 24   Diabetes Father    Heart disease Father    Hyperlipidemia Father    Hypertension Father    Heart attack Father 63   Heart disease Maternal Grandmother    Heart attack Maternal Grandmother 88   Arthritis Maternal Grandmother    Early death Maternal Grandfather        MVA   Diabetes Paternal Grandmother    Hypertension Paternal Grandmother    Heart disease Paternal Grandmother    Heart attack Paternal Grandmother 47   Heart disease Paternal Grandfather    Heart attack Paternal Grandfather 92   Coronary artery disease Other    Glaucoma Other    Non-Hodgkin's lymphoma Paternal Uncle     Family history reviewed and not pertinent    Prior to  Admission medications   Medication Sig Start Date End Date Taking? Authorizing Provider  amoxicillin-clavulanate (AUGMENTIN) 875-125 MG tablet Take 1 tablet by mouth 2 (two) times daily. 12/30/21   Heath Lark, MD  apixaban (ELIQUIS) 2.5 MG TABS tablet Take 1 tablet (2.5 mg total) by mouth 2 (two) times daily. Wait to start this until after you have had your procedures 12/07/21   Lafonda Mosses, MD  dexamethasone (DECADRON) 4 MG tablet Take 2 tablets (8 mg total) by mouth daily. Start the day after last cisplatin dose on day 6 of chemo for 3 days. Take with food. 12/10/21   Heath Lark, MD  famotidine (PEPCID) 10 MG tablet Take 10 mg by mouth daily as needed for heartburn or indigestion.    [provider]  furosemide  (LASIX) 20 MG tablet Take 1 tablet (20 mg total) by mouth daily for 5 days. 12/23/21 12/28/21  Barrie Folk, PA-C  lidocaine-prilocaine (EMLA) cream Apply to affected area once 12/10/21   Heath Lark, MD  loratadine (CLARITIN) 10 MG tablet Take 10 mg by mouth daily as needed for allergies.    [provider]  LORazepam (ATIVAN) 0.5 MG tablet Take 1 tablet (0.5 mg total) by mouth every 8 (eight) hours. 12/10/21   Heath Lark, MD  ondansetron (ZOFRAN) 8 MG tablet Take 1 tablet (8 mg total) by mouth every 8 (eight) hours as needed for nausea or vomiting. Start on the third day after last cisplatin dose on day 8 of chemo 12/10/21   Heath Lark, MD  prochlorperazine (COMPAZINE) 10 MG tablet Take 1 tablet (10 mg total) by mouth every 6 (six) hours as needed for nausea or vomiting. 12/10/21   Heath Lark, MD     Objective    Physical Exam: Vitals:   01/03/22 1557 01/03/22 1730 01/03/22 1839 01/03/22 1852  BP:  123/74 126/74   Pulse:  (!) 110 (!) 110   Resp:  15 18   Temp: 99.1 F (37.3 C)  99.2 F (37.3 C)   TempSrc: Oral  Oral   SpO2:  97% 99%   Weight:    53.1 kg  Height:    _0  (1.499 m)    General: appears to be stated age; alert, oriented Skin: warm, dry, no rash Head:  AT/[place] Mouth:  Oral mucosa membranes appear dry, normal dentition Neck: supple; trachea midline Heart:  RRR; did not appreciate any M/R/G Lungs: CTAB, did not appreciate any wheezes, rales, or rhonchi Abdomen: + BS; soft, mild distention, mild generalized tenderness in the absence of any associated guarding, rigidity, or rebound tenderness. Vascular: 2+ pedal pulses b/l; 2+ radial pulses b/l Extremities: no peripheral edema, no muscle wasting Neuro: strength and sensation intact in upper and lower extremities b/l     Labs on Admission: I have personally reviewed following labs and imaging studies  CBC: Recent Labs  Lab 12/30/21 1443 12/31/21 0811 01/03/22 1047  WBC 0.5* 0.3* 1.5*   NEUTROABS 0.1* 0.1* 0.3*  HGB 7.9* 8.1* 7.3*  HCT 24.2* 24.4* 22.3*  MCV 86.7 86.2 85.8  PLT 131* 130* 935   Basic Metabolic Panel: Recent Labs  Lab 12/28/21 1037 12/30/21 1443 01/03/22 1047  NA 134* 136 138  K 3.7 3.4* 3.6  CL 102 101 98  CO2 _1 GLUCOSE 106* 134* 108*  BUN _2 CREATININE 0.46 0.52 0.53  CALCIUM 8.1* 8.4* 9.3   GFR: Estimated Creatinine Clearance: 62.7 mL/min (by C-G formula based on  SCr of 0.53 mg/dL). Liver Function Tests: Recent Labs  Lab 12/28/21 1037 12/30/21 1443 01/03/22 1047  AST 9* 10* 11*  ALT _0 ALKPHOS 52 100 135*  BILITOT 0.5 0.6 0.6  PROT 6.0* 6.4* 6.7  ALBUMIN 3.2* 3.2* 3.3*   No results for input(s): "LIPASE", "AMYLASE" in the last 168 hours. No results for input(s): "AMMONIA" in the last 168 hours. Coagulation Profile: No results for input(s): "INR", "PROTIME" in the last 168 hours. Cardiac Enzymes: No results for input(s): "CKTOTAL", "CKMB", "CKMBINDEX", "TROPONINI" in the last 168 hours. BNP (last 3 results) No results for input(s): "PROBNP" in the last 8760 hours. HbA1C: No results for input(s): "HGBA1C" in the last 72 hours. CBG: No results for input(s): "GLUCAP" in the last 168 hours. Lipid Profile: No results for input(s): "CHOL", "HDL", "LDLCALC", "TRIG", "CHOLHDL", "LDLDIRECT" in the last 72 hours. Thyroid Function Tests: No results for input(s): "TSH", "T4TOTAL", "FREET4", "T3FREE", "THYROIDAB" in the last 72 hours. Anemia Panel: No results for input(s): "VITAMINB12", "FOLATE", "FERRITIN", "TIBC", "IRON", "RETICCTPCT" in the last 72 hours. 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 Diffuse abdominal swelling. Fever for 6 days. * Tracking Code: BO * EXAM: CT ABDOMEN AND PELVIS WITH CONTRAST TECHNIQUE: Multidetector CT imaging of the abdomen and pelvis was performed using the standard protocol following bolus administration of intravenous contrast. RADIATION DOSE REDUCTION: This exam was performed according to the departmental dose-optimization program which includes automated exposure control, adjustment of the mA and/or kV according to patient size and/or use of iterative reconstruction technique. CONTRAST:  75m OMNIPAQUE IOHEXOL 350 MG/ML SOLN COMPARISON:  11/17/2021 FINDINGS: Lower chest: Unremarkable Hepatobiliary: Heterogeneous likely subcapsular mass in the dome of the liver primarily of the right hepatic lobe with speckled internal calcifications, measuring 10.8 by 5.3 by 8.7 cm (volume = 260 cm^3), previously 9.6 by 5.2 by 8.8 cm (volume = 230 cm^3) on 12/07/2021. Lobularity along the margin with the hepatic parenchyma suggests hepatic invasion. Contracted gallbladder. Perihepatic ascites noted. No new hepatic lesion observed. No biliary dilatation. Pancreas: Unremarkable Spleen: Unremarkable Adrenals/Urinary Tract: Suspected 2 mm left kidney lower pole nonobstructive renal calculus, image 33 series 2. Adrenal glands unremarkable. No hydronephrosis. Urinary bladder unremarkable. Stomach/Bowel: Displacement of bowel loops by the large central abdominopelvic tumor. No current dilated bowel. Vascular/Lymphatic: Mild abdominal aortic atherosclerotic vascular disease. Reproductive: Tracking above and around the uterus and filling a substantial portion of the abdominopelvic cavity, we demonstrate a 24.8 by  15.1 by 17.6 cm (volume = 3450 cm^3) heterogeneous mass with scattered calcifications compatible with malignancy. Prior CT abdomen did not include the pelvis and accordingly size wise this is difficult to compare. However, the abdominal component is similar to previous. This may be arising from the ovaries with slight eccentricity to the right in the pelvis. Other: Scattered malignant ascites. A cystic and solid right omental deposit measures 9.3 by 4.3 cm on image 47 series  2, formerly 8.4 by 3.7 cm. There stranding and some mild nodularity along the rest of the omentum. Perihepatic and perisplenic ascites noted along with ascites tracking in the paracolic gutter and in the pelvis. Musculoskeletal: Unremarkable IMPRESSION: 1. Large central abdominopelvic mass compatible with malignancy, 3450 cubic cm in volume. This may be arising from the ovaries with slight eccentricity to the right in the pelvis. 2. Mild increase in size of the right omental tumor deposit. 3. Mild increase in size of the subcapsular mass along the dome of the right hepatic lobe, with suspected hepatic invasion. 4. Scattered malignant ascites. 5. No dilated bowel to suggest obstruction. 6. 2 mm left kidney lower pole nonobstructive renal calculus. 7. Mild abdominal aortic atherosclerotic vascular disease. Aortic Atherosclerosis (ICD10-I70.0). Electronically Signed   By: Van Clines M.D.   On: 01/03/2022 13:52   DG Chest 1 View  Result Date: 01/03/2022 CLINICAL DATA:  Fever of unknown origin. EXAM: CHEST  1 VIEW COMPARISON:  12/30/2021 FINDINGS: There is a right chest wall port a catheter with tip in the projection of the SVC. Normal heart size. No pleural effusion or edema. No airspace opacities identified. Visualized osseous structures are unremarkable. IMPRESSION: No active disease. Electronically Signed   By: Kerby Moors M.D.   On: 01/03/2022 11:32      Assessment/Plan    Principal Problem:   Neutropenic fever  (HCC) Active Problems:   Sepsis ([place])   Normocytic anemia   Dehydration   Generalized weakness   GAD (generalized anxiety disorder)     #) Neutropenic fever,associated with sepsis: In the context of 1 week of objective fever, with objective fever also noted upon presentation to the ED today, with temperature max in the ED noted to be one 101.3, with presenting CBC reflecting absolute neutrophil count of 300, which appears to be trending up from 100 on 12/31/2021.  Additionally, interval increase in monocytes relative to 12/31/2021 is encouraging as this is one of the first salines taken to recovery in the setting of neutropenia. 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  No overt source of underlying infectious process at this time, including chest x-ray which showed no evidence of acute process, including no evidence of infiltrate, We will COVID-19/influenza PCR were negative, urinalysis was nonsuggestive of UTI, and CT abdomen/pelvis showed no overt underlying infection, likely the possibility of superinfection as it relates to her large central abdominopelvic mass is a possibility.  In the context of recent rhinitis/rhinorrhea, along with lymphocytic predominant differential, viral associated upper respiratory infection is a possibility.  It is noted that procalcitonin is significantly elevated at greater than 3, potentially inflammatory in nature, but also associated with increased risk for bacteremia.   EDP [facility] discussed patient's case with her oncologist, Dr. Alvy Bimler , who recommended admission for neutropenic fever, including initiation of IV cefepime.  Dr. Alvy Bimler to formally consult, with additional recommendations pending at this time.  will continue with the neutropenic fever dosing of cefepime that was started at [facility] earlier  today.  There is not appear to be an indication for inclusion of IV vancomycin or antifungal coverage at this time.  SIRS criteria met via presenting objective fever as well as tachycardia.  Lactic acid nonelevated, as quantified above.  In the absence of any evidence of endorgan damage, criteria are not met for the patient's sepsis to be considered severe in  nature.  Cultures x2 were collected prior to initiation of aforementioned cefepime.   Plan: Cefepime 2 g IV every 8 hours. Will closely monitor for additional fever.  Prn acetaminophen for fever.  Repeat CBC with differential in the morning, with close attention to ensuing as neutrophil count.  Close monitor for signs of cultures x2.  Neutropenia precautions.  No DRE's.  Continuous IV fluids in the form of lactated Ringer's at 100 cc/h x 12 hours.  Repeat procalcitonin level in the morning.  Oncology to consult, as further detailed above.         #) Subacute anemia: Gradual decline in hemoglobin over the last few weeks, which appears to coincide with onset of chemotherapy, as further quantified above, with presenting hemoglobin today of 7.3 associated with normocytic/normochromic findings as well as nonelevated RDW.  Suspect contribution from chemotherapy given this history and timeframe.  No overt evidence of active bleed.  Technologist smear review was ordered at [place] earlier today, with result currently pending.  Plan: Follow-up for result of previously ordered technologist review, as above.  Add on iron studies and reticulocyte count.  Type and screen ordered.  Repeat CBC in the morning.  Check INR.             #) Dehydration: Clinical suspicion for such, including the appearance of dry oral mucous membranes as well as laboratory findings notable for UA demonstrating elevated specific gravity, in addition to improving tachycardia with IV fluids.  Appears to be in the setting of recent decline in oral intake following  initiation of chemotherapy.  Not associated any hypotension.  Status post a total of 2 L of IV fluid bolus and [facility] earlier today.   Plan: Monitor strict I's and O's.  Daily weights.  Repeat CMP in the morning. IVF's in form of lactated Ringer's at 100 cc/h x 12 hours.[name]             #) Generalized weakness: 1 week of generalized weakness in the absence of any acute focal weakness.  Suspect this is multifactorial in etiology, contributions from physiologic stress stemming from recently initiated chemotherapy superimposed on underlying malignancy, along with influences from interval development of anemia and neutropenic fever, as above, in addition to clinical suspicion for an element of dehydration, as above.  Plan: Further evaluation management of presenting neutropenic fever, as above in addition to further evaluation management of presenting subacute anemia.  Add on TSH.  Fall precautions.  Physical therapy consult placed for the morning.  Repeat CMP/CBC in the morning.  Check serum magnesium level.          #) Generalized anxiety disorder: Documented history of such, for which she is on Ativan 3 times daily scheduled as an outpatient.  Does not currently appear to be on a scheduled SSRI or SNRI.  Plan: will continue the frequency of the patient's home Ativan, but change from scheduled to as needed and this available via IV to circumvent any potential ensuing nausea/vomiting.      DVT prophylaxis: SCD's   Code Status: Full code Family Communication: none Disposition Plan: Per Rounding Team Consults called: EDP at [facility] discussed patient's case with Dr. Alvy Bimler, Of oncology, who will consult, as further detailed above;  Admission status: inpatient    I SPENT GREATER THAN 75  MINUTES IN CLINICAL CARE TIME/MEDICAL DECISION-MAKING IN COMPLETING THIS ADMISSION.     Sula DO [facility] [place]   01/03/2022, 7:30 PM

## 2022-01-03 NOTE — Telephone Encounter (Signed)
I spoke with the and over the telephone The patient has been seen several times over the past week for recurrent neutropenic fever She is taking Augmentin She continues to spike fevers daily over the weekend The latest bout of fever was 4:00 this morning with a temperature of 101.7 and woke the patient up She felt more pressure, abdominal distention and pain in her abdomen  So far, all cultures were negative Urine culture, blood culture and chest x-ray did not reveal source of infection.  The only possible source would be oropharynx and should the patient have some sinus congestion We discussed the risks and benefits of inpatient management of her current situation It is possible that she might benefit from IV antibiotics and oral antibiotics with Augmentin has failed to resolve her fever One third of the patients with neutropenic fever may not have identifiable source of infection  It is also possible she might have recurrent tumor fever The only way to diagnose this would be repeat imaging study, especially with her progressive abdominal distention Unfortunately, with the looming holidays, I am not able to get CT imaging approved and authorized soon enough.  The benefit of being admitted to the hospital would be urgent evaluation for neutropenic fever as well as urgent CT imaging of the abdomen and pelvis to document whether she has disease progression while on chemotherapy  Unfortunately, I am not able to admit her directly from the outpatient clinic due to the hospital census.    Ultimately, the patient agreed to go to the emergency department for urgent evaluation and admission to the hospital for management of neutropenic fever, failed outpatient therapy

## 2022-01-03 NOTE — Progress Notes (Signed)
Patient wants something to sleep. She said Melatonin has opposite effect and wants something else. She also wants to know when the provider is coming to see her. Notified Dr. Cyd Silence and awaiting for a response.

## 2022-01-03 NOTE — ED Triage Notes (Signed)
Pt arrives to ED with c/o fever x6 days. Hx neutropenic fever and malignant germ cell tumor of ovary. Currently receiving chemotherapy. Has been on Augmentin w/o relief. Her Oncologist recommended admission on 11/17.

## 2022-01-03 NOTE — ED Notes (Signed)
provider notified of temp

## 2022-01-03 NOTE — ED Notes (Signed)
Another RN covered this RN while in room with another Pt. Other RN gave report to [facility] to give bedside report to floor. Pt transported while this Rn was unaware and in another room. Responded to RN on floor over  and left name and number to be called back in necessary

## 2022-01-03 NOTE — ED Notes (Signed)
Patient states prefer not to have PAC accessed at this time.  Second PIV start with [place] and second Blood cultures drawn before start of antibotics

## 2022-01-03 NOTE — ED Notes (Signed)
Pt transported while this Rn was being covered by another Therapist, sports. Pt transported by [facility]. Addressed same via messaging and called in attempt to call report if necessary to floor, Left message with secretary with this Rn's name and number if further information was needed.

## 2022-01-03 NOTE — ED Notes (Signed)
Provider notified of new onset of temp. Pt requested not to take anything at this time for temp (99.7)

## 2022-01-03 NOTE — Progress Notes (Signed)
Patient received from [place], VS obtained, placed on telemetry monitor, oriented to unit, call light placed in reach

## 2022-01-03 NOTE — Hospital Course (Signed)
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 She had outpatient fevers and was started on Augmentin per oncology.  Due to ongoing fevers she presented for further workup and evaluation.   Upon evaluation in the emergency department, patient has been found to be neutropenic and leukopenic with multiple SIRS criteria including temp 101.7.  EDP discussed case with Dr. Alvy Bimler who recommended admission, infectious work-up including repeat CT imaging of the abdomen pelvis and initiation of intravenous antibiotics.

## 2022-01-04 ENCOUNTER — Inpatient Hospital Stay: Payer: 59

## 2022-01-04 ENCOUNTER — Inpatient Hospital Stay: Payer: 59 | Admitting: Genetic Counselor

## 2022-01-04 ENCOUNTER — Inpatient Hospital Stay: Payer: 59 | Admitting: Hematology and Oncology

## 2022-01-04 ENCOUNTER — Other Ambulatory Visit: Payer: 59

## 2022-01-04 DIAGNOSIS — C562 Malignant neoplasm of left ovary: Secondary | ICD-10-CM

## 2022-01-04 DIAGNOSIS — D709 Neutropenia, unspecified: Secondary | ICD-10-CM | POA: Diagnosis not present

## 2022-01-04 DIAGNOSIS — C801 Malignant (primary) neoplasm, unspecified: Secondary | ICD-10-CM

## 2022-01-04 DIAGNOSIS — R5081 Fever presenting with conditions classified elsewhere: Secondary | ICD-10-CM | POA: Diagnosis not present

## 2022-01-04 DIAGNOSIS — D61818 Other pancytopenia: Secondary | ICD-10-CM

## 2022-01-04 DIAGNOSIS — E46 Unspecified protein-calorie malnutrition: Secondary | ICD-10-CM

## 2022-01-04 LAB — COMPREHENSIVE METABOLIC PANEL
ALT: 14 U/L (ref 0–44)
AST: 16 U/L (ref 15–41)
Albumin: 2.1 g/dL — ABNORMAL LOW (ref 3.5–5.0)
Alkaline Phosphatase: 126 U/L (ref 38–126)
Anion gap: 11 (ref 5–15)
BUN: 11 mg/dL (ref 6–20)
CO2: 21 mmol/L — ABNORMAL LOW (ref 22–32)
Calcium: 7.8 mg/dL — ABNORMAL LOW (ref 8.9–10.3)
Chloride: 104 mmol/L (ref 98–111)
Creatinine, Ser: 0.49 mg/dL (ref 0.44–1.00)
GFR, Estimated: >60 mL/min (ref >60)
Glucose, Bld: 79 mg/dL (ref 70–99)
Potassium: 3.1 mmol/L — ABNORMAL LOW (ref 3.5–5.1)
Sodium: 136 mmol/L (ref 135–145)
Total Bilirubin: 0.7 mg/dL (ref 0.3–1.2)
Total Protein: 5.5 g/dL — ABNORMAL LOW (ref 6.5–8.1)

## 2022-01-04 LAB — CBC WITH DIFFERENTIAL/PLATELET
Abs Immature Granulocytes: 0.5 10*3/uL — ABNORMAL HIGH (ref 0.00–0.07)
Basophils Absolute: 0 10*3/uL (ref 0.0–0.1)
Basophils Relative: 0 %
Eosinophils Absolute: 0 10*3/uL (ref 0.0–0.5)
Eosinophils Relative: 0 %
HCT: 20.1 % — ABNORMAL LOW (ref 36.0–46.0)
Hemoglobin: 6.4 g/dL — CL (ref 12.0–15.0)
Immature Granulocytes: 10 %
Lymphocytes Relative: 28 %
Lymphs Abs: 1.4 10*3/uL (ref 0.7–4.0)
MCH: 27.7 pg (ref 26.0–34.0)
MCHC: 31.8 g/dL (ref 30.0–36.0)
MCV: 87 fL (ref 80.0–100.0)
Monocytes Absolute: 1.4 10*3/uL — ABNORMAL HIGH (ref 0.1–1.0)
Monocytes Relative: 29 %
Neutro Abs: 1.6 10*3/uL — ABNORMAL LOW (ref 1.7–7.7)
Neutrophils Relative %: 33 %
Platelets: 162 10*3/uL (ref 150–400)
RBC: 2.31 MIL/uL — ABNORMAL LOW (ref 3.87–5.11)
RDW: 15 % (ref 11.5–15.5)
WBC: 4.9 10*3/uL (ref 4.0–10.5)
nRBC: 0.6 % — ABNORMAL HIGH (ref 0.0–0.2)

## 2022-01-04 LAB — CULTURE, BLOOD (SINGLE): Culture: NO GROWTH

## 2022-01-04 LAB — PREPARE RBC (CROSSMATCH)

## 2022-01-04 LAB — HEMOGLOBIN AND HEMATOCRIT, BLOOD
HCT: 24.8 % — ABNORMAL LOW (ref 36.0–46.0)
Hemoglobin: 7.9 g/dL — ABNORMAL LOW (ref 12.0–15.0)

## 2022-01-04 LAB — MAGNESIUM: Magnesium: 1.8 mg/dL (ref 1.7–2.4)

## 2022-01-04 LAB — PROTIME-INR
INR: 1.7 — ABNORMAL HIGH (ref 0.8–1.2)
Prothrombin Time: 20.1 seconds — ABNORMAL HIGH (ref 11.4–15.2)

## 2022-01-04 LAB — PROCALCITONIN: Procalcitonin: 2.49 ng/mL

## 2022-01-04 MED ORDER — ACETAMINOPHEN 325 MG PO TABS
650.0000 mg | ORAL_TABLET | Freq: Once | ORAL | Status: AC
  Administered 2022-01-04: 650 mg via ORAL
  Filled 2022-01-04: qty 2

## 2022-01-04 MED ORDER — SODIUM CHLORIDE 0.9% IV SOLUTION: Freq: Once | INTRAVENOUS | Status: AC

## 2022-01-04 MED ORDER — DIPHENHYDRAMINE HCL 25 MG PO CAPS
25.0000 mg | ORAL_CAPSULE | Freq: Once | ORAL | Status: AC
  Administered 2022-01-04: 25 mg via ORAL
  Filled 2022-01-04: qty 1

## 2022-01-04 MED ORDER — PROCHLORPERAZINE EDISYLATE 10 MG/2ML IJ SOLN: 10.0000 mg | Freq: Four times a day (QID) | INTRAMUSCULAR | Status: DC | PRN

## 2022-01-04 MED ORDER — POTASSIUM CHLORIDE CRYS ER 20 MEQ PO TBCR
40.0000 meq | EXTENDED_RELEASE_TABLET | ORAL | Status: AC
  Administered 2022-01-04 (×2): 40 meq via ORAL
  Filled 2022-01-04 (×2): qty 2

## 2022-01-04 MED ORDER — CHLORHEXIDINE GLUCONATE CLOTH 2 % EX PADS
6.0000 | MEDICATED_PAD | Freq: Every day | CUTANEOUS | Status: DC
  Administered 2022-01-04 – 2022-01-07 (×4): 6 via TOPICAL

## 2022-01-04 NOTE — Progress Notes (Signed)
Olena Heckle said, " we are going to wait for Dr Alvy Bimler to weigh in this AM." Will continue to monitor.

## 2022-01-04 NOTE — Progress Notes (Signed)
  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 Alvy Bimler.  Patient presented to [place] with ongoing fever for the past 6 days.  In the days preceding patient's presentation Dr. Alvy Bimler attempted infectious work-up while managing patient with oral Augmentin outpatient cultures have been negative thus far.  Despite outpatient antibiotics patient continued to clinically worsen prompting her presentation to [facility] emergency department.  Upon evaluation in the emergency department, patient has been found to be neutropenic and leukopenic with multiple SIRS criteria including temp 101.7.  EDP discussed case with Dr. Alvy Bimler who recommended admission, infectious work-up including repeat CT imaging of the abdomen pelvis and initiation of intravenous antibiotics.  The hospitalist group has now been called and patient is being accepted for transfer to [facility] long hospital for continued medical care.    Assessment and Plan: #) Neutropenic fever,associated with sepsis: -Presented with 1 week hx of fever with tmax 101.3, ANC of 300  -Thus far, continued on broad spec abx -F/u on cultures. No identifiable source of infection at this time -Discussed with Dr. Alvy Bimler. Very possible for pt to be having cancer related fevers in setting of progressing disease. -Recheck cbc and bmet in AM   #) Subacute anemia:  -hgb trended down to 6.4  -now s/p 1 unit PRBC 11/21 -hemodynamically stable -Recheck CBC in AM   #) Dehydration:  -s/p IVF at time of presentation -Stable -recheck bmet in AM  #) Generalized weakness:   -1 week of generalized weakness in the absence of any acute focal weakness.   -Cont to encourage ambulation as tolerated   #) Generalized anxiety disorder:  -Continue ativan as needed -Stable at this time -continue zofran PRN nausea. Have added compazine for breakthrough nausea as well, per pt wishes  #) Hypokalemia -Will replace -Recheck bmet in AM      Subjective: Without complaints this AM. Hoping to go home soon  Physical Exam: Vitals:   01/04/22 1120 01/04/22 1155 01/04/22 1224 01/04/22 1425  BP: 100/79 105/65 104/65 114/80  Pulse: (!) 119 (!) 111 (!) 108 (!) 110  Resp: '19 20 18 17  '$ Temp: 100.3 F (37.9 C) 100 F (37.8 C) 98.7 F (37.1 C) 99.3 F (37.4 C)  TempSrc: Oral Oral Oral Oral  SpO2: 96% 96% 96%   Weight:      Height:       General exam: Awake, laying in bed, in nad Respiratory system: Normal respiratory effort, no wheezing Cardiovascular system: regular rate, s1, s2 Gastrointestinal system: Soft, nondistended, positive BS Central nervous system: CN2-12 grossly intact, strength intact Extremities: Perfused, no clubbing Skin: Normal skin turgor, no notable skin lesions seen Psychiatry: Mood normal // no visual hallucinations   Data Reviewed:  Labs reviewed: Na 136, K 3.1, Cr 0.49, Hgb 6.4, WBC 4.9   Family Communication: Pt in room, family at bedside  Disposition: Status is: Inpatient Remains inpatient appropriate because: Severity of illness  Planned Discharge Destination: Home    Author: Marylu Lund, MD 01/04/2022 3:20 PM  For on call review [email or web address].
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
## 2022-01-04 NOTE — Progress Notes (Signed)
Her Hgb is 6.4 this morning. Olena Heckle NP notified.

## 2022-01-04 NOTE — Progress Notes (Signed)
PT Cancellation Note  Patient Details Name: [name] MRN: [id number] DOB: 01/18/1971   Cancelled Treatment:    Reason Eval/Treat Not Completed: PT screened, no needs identified, will sign off (pt and spouse report pt is independent with mobility and does not need PT at present. Pt was encouraged to ambulate in halls to minimize deconditioning during hospitalization.)   [name] PT 01/04/2022  [place]  Office [phone]

## 2022-01-04 NOTE — Consult Note (Signed)
Gynecologic Oncology Consultation  [name] 51 y.o. 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 The patient had several occurrences of recurrent neutropenic fever and was taking Augmentin in the outpatient setting. She continued to spike daily fevers with symptoms of worsening abdominal pressure, distension, and pain. In the outpatient setting, she had a urine culture, blood culture (no growth in 5 days), and chest xray with no source of infection identified. In order to expedite care, she was advised to seek care in the ER in order to obtain CT imaging more promptly along with IV therapy if needed.  A CT scan of the abdomen and pelvis was performed on 01/03/2022 resulting 1. Large central abdominopelvic mass compatible with malignancy, 3450 cubic cm in volume. This may be arising from the ovaries with slight eccentricity to the right in the pelvis. 2. Mild increase in size of the right omental tumor deposit. 3. Mild increase in size of the subcapsular mass along the dome of the right hepatic lobe, with suspected hepatic invasion. 4. Scattered malignant ascites. 5. No dilated bowel to suggest obstruction. 6. 2 mm left kidney lower pole nonobstructive renal calculus. 7. Mild abdominal aortic atherosclerotic vascular disease. A chest xray was also performed and was negative. CBC on 11/20 with WBC 1.5, Hgb 7.3, Hct 22.3, ANC 0.3, albumin 3.3, alkaline phos 135, creatinine 0.53, ferritin 826, lactic acid 1.2 and 1.1, iron 9, TIBC 106.  Upon initial diagnosis, she presented to her gynecologist, Dr. Wilhelmenia Blase, with new onset uterine bleeding and abdominal/pelvic fullness. An office ultrasound was performed revealing a uterus measuring 8.6 x 4.4 x 4 cm,  deviated to the left with increased vascularity within the myometrium.  Right ovary noted to have a 16 cm heterogenous, cystic and solid-appearing vascular mass.  Left ovary was not visualized.  Questionable ascites in the right lower quadrant. An endometrial biopsy was attempted and aborted due to patient discomfort. US findings prompted a pelvic MRI. This was performed on 12/02/2021 revealing: multilobulated mass measuring 21 x 11 x 19 cm abutting both adnexa.  Satellite mass in the right upper quadrant anteriorly measures 6.9 x 4.1 x 6.4 cm.  Moderate ascites with tumor deposits along the pelvic ascites posteriorly.  Mass extends above the uterus, could be arising from either or both ovaries.  There is abnormal enhancement along ascites margins in the paracolic gutter, most compatible with malignant ascites.  At least a 7 cm tumor deposit noted just above the liver.   At her consultation with Dr. Jeral Pinch on 12/07/2021, it was recommended she proceed with a CT angio of the chest to rule out PE and lower extremity dopplers given tachycardia, hypercoagulability in the setting of malignancy, and compression of great vessels in the mid abdomen secondary to mass effect. Both tests were negative. A CT of the abdomen was also preformed showing a large, approximately 10 cm, subcapsular liver implant with concern for invasion. Given this findings, it was recommended she proceed with a biopsy and neoadjuvant chemotherapy. On 12/08/2021, she underwent port a cath placement along with a right abdominal mass biopsy. 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 Under the care of Dr. Heath Lark with Medical  Oncology, she was started on bleomycin, etoposide, and cisplatin with this starting on 12/20/2021.       Her history is notable for an ectopic in 2017 treated with methotrexate.  She underwent tubal ligation (one tube completely removed) for  contraception.  At the time of the surgery in 2009, she underwent diagnostic laparoscopy, left tubal interruption with cautery, right salpingectomy, and ablation of right ovarian fossa endometriosis.   Interval History: She has been having diarrhea since taking Augmentin over the weekend. Prior to admission, she had been having fevers for 7 days as high as 101.7. She has been very uncomfortable. Hard to talk and move around due to abdominal distension. Difficulty eating and not taking in much. Had been using protein powder several times a day. Has been eating small amounts of oatmeal, cereal bars. Intermittent nausea with feelings of no room in her abdomen. Bowels have been loose at home but more formed compared with recent stools. She feels her breathing has gotten worse.   Review of Systems: See HPI and Interval.  Current Meds: Current inpt meds reviewed. Currently on cefepime IV.  Allergy:  Allergies  Allergen Reactions   Latex Other (See Comments)    Sensitivity only   Sulfa Antibiotics Hives and Other (See Comments)   Sulfamethoxazole-Trimethoprim Rash   Septra [Bactrim] Rash    Social Hx:   Social History   Socioeconomic History   Marital status: Married    Spouse name: Not on file   Number of children: 3   Years of education: Not on file   Highest education level: Not on file  Occupational History   Occupation: nurse    Employer: [employer]    Comment: at [employer]   Tobacco Use   Smoking status: Never   Smokeless tobacco: Never  Vaping Use   Vaping Use: Never used  Substance and Sexual Activity   Alcohol use: Yes    Comment: Occasionally   Drug use: No   Sexual activity: Yes    Partners: Male    Birth control/protection: Surgical  Other Topics Concern   Not on file  Social History Narrative   Married, 3 kids.    Lives in [place].   Educ: Masters deg nursing.   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   No tob/drugs.   Alc: social.   Social Determinants of Health   Financial Resource Strain: Not on file  Food Insecurity: No Food Insecurity (12/15/2021)   Hunger Vital Sign    Worried About Running Out of Food in the Last Year: Never true    Ran Out of Food in the Last Year: Never true  Transportation Needs: No Transportation Needs (12/15/2021)   PRAPARE - Hydrologist (Medical): No    Lack of Transportation (Non-Medical): No  Physical Activity: Not on file  Stress: Not on file  Social Connections: Not on file  Intimate Partner Violence: Not on file    Past Surgical Hx:  Past Surgical History:  Procedure Laterality Date   ABDOMINAL EXPLORATION SURGERY  2009   fulguration of endometriosis - lsc surgery   IR IMAGING GUIDED PORT INSERTION  12/08/2021   IR US GUIDE BX ASP/DRAIN  12/08/2021   SALPINGECTOMY Right 2009   TONSILLECTOMY     TUBAL LIGATION Left 2009    Past Medical Hx:  Past Medical History:  Diagnosis Date  Arthritis    Endometriosis    Family history of breast cancer in mother    at age 51   GAD (generalized anxiety disorder)    lexapro helped 2017 (Dr. Hoy Finlay did have extra stress of the Master's degree program on her at that time.  Got counseling.       GERD (gastroesophageal reflux disease)    Gestational diabetes    Kidney stones    Low back pain    Dr. Charlann Boxer.   Neck pain    and left shoulder pain: managed by osteopathic manipulation by Dr. Charlann Boxer. ?cervical radiculopathy   PAC (premature atrial contraction)     Family Hx:  Family History  Problem Relation Age of Onset   Rheum arthritis Mother    Stroke Mother    Hypertension Mother    Heart disease Mother    Arthritis Mother    Breast cancer Mother    Heart attack Mother 72   Diabetes Father    Heart disease Father    Hyperlipidemia Father    Hypertension Father    Heart attack Father 61   Heart disease Maternal Grandmother     Heart attack Maternal Grandmother 88   Arthritis Maternal Grandmother    Early death Maternal Grandfather        MVA   Diabetes Paternal Grandmother    Hypertension Paternal Grandmother    Heart disease Paternal Grandmother    Heart attack Paternal Grandmother 54   Heart disease Paternal Grandfather    Heart attack Paternal Grandfather 92   Coronary artery disease Other    Glaucoma Other    Non-Hodgkin's lymphoma Paternal Uncle     Vitals:  Blood pressure 114/80, pulse (!) 110, temperature 99.3 F (37.4 C), temperature source Oral, resp. rate 17, height '4\' 11"'$  (1.499 m), weight 117 lb 1.6 oz (53.1 kg), SpO2 96 %.  Physical Exam:  See addition to note from Dr. 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 She is s/p initiation of neoadjuvant chemotherapy (bleomycin, etoposide, and cisplatin) on 12/20/2021 under the care of Dr. Alvy Bimler with Medical Oncology. CT AP from 01/03/22 with pelvic mass again noted and mild increase in size of right omental tumor and subcapsular mass along with dome of the right hepatic lobe. Per Dr. Alvy Bimler, supportive care is recommended given poor response to therapy and persistent neutropenic fever for 6 days despite antibiotics. Due to hgb at 6.4, she received 1 unit of PRBC today with repeat labs ordered for the am. See Dr. Berline Lopes addition to note for discussion about possible surgery in several weeks at [facility].   Linda Gibbs, NP 01/04/2022, 4:39 PM

## 2022-01-04 NOTE — Progress Notes (Signed)
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 The patient is symptomatic from anemia and the hemoglobin level is critically low.  Some of the side-effects to be expected including risks of transfusion reactions, chills, infection, syndrome of volume overload and risk of hospitalization from various reasons and the patient is willing to proceed and went ahead to sign consent today. I will arrange for a unit of blood today  Neutropenic fever Neutropenia has resolved Continue antibiotics for 2 more days until culture negative.  All the cultures I drew last week were negative  Antibiotic associated diarrhea This is due to antibiotics related Observe closely  Mild electrolyte imbalance Related to treatment and recent diarrhea Observe  Discharge planning She will likely be here for the next 48 hours   All questions were answered. The patient knows to call the clinic with any problems, questions or concerns.   The total time spent in the appointment was 55 minutes encounter with patients including review of chart and various tests results, discussions about plan of care and coordination of care plan  Heath Lark, MD 01/04/2022 9:18 AM  Subjective:  The patient was admitted to the hospital through the emergency department after our discussion yesterday She is well-known to me, with malignant germ cell tumor with ongoing chemotherapy.  Her treatment course is complicated by neutropenic fever  last week.  All cultures were negative. She complains of fatigue She has some loose stool this morning  Objective:  Vitals:   01/04/22 0243 01/04/22 0618  BP: 108/70 118/66  Pulse: (!) 108 (!) 113  Resp: 20 18  Temp: 98.7 F (37.1 C) 98.8 F (37.1 C)  SpO2: 98% 98%     Intake/Output Summary (Last 24 hours) at 01/04/2022 0918 Last data filed at 01/04/2022 0600 Gross per 24 hour  Intake 2124.31 ml  Output --  Net 2124.31 ml    GENERAL:alert, no distress and comfortable SKIN: skin color, texture, turgor are normal, no rashes or significant lesions EYES: normal, Conjunctiva are pink and non-injected, sclera clear OROPHARYNX:no exudate, no erythema and lips, buccal mucosa, and tongue normal  NECK: supple, thyroid normal size, non-tender, without nodularity LYMPH:  no palpable lymphadenopathy in the cervical, axillary or inguinal LUNGS: clear to auscultation and percussion with normal breathing effort HEART: tachycardia, no murmurs and no lower extremity edema ABDOMEN:abdomen soft, significant distention from progression of the tumor Musculoskeletal:no cyanosis of digits and no clubbing  NEURO: alert & oriented x 3 with fluent speech, no focal motor/sensory deficits   Labs:  Recent Labs    12/30/21 1443 01/03/22 1047 01/04/22 0457  NA 136 138 136  K 3.4* 3.6 3.1*  CL 101 98 104  CO2 24 27 21*  GLUCOSE 134* 108* 79  BUN '9 11 11  '$ CREATININE 0.52 0.53 0.49  CALCIUM 8.4* 9.3 7.8*  GFRNONAA >60 >60 >60  PROT 6.4* 6.7 5.5*  ALBUMIN 3.2* 3.3* 2.1*  AST 10* 11* 16  ALT '16 12 14  '$ ALKPHOS 100  135* 126  BILITOT 0.6 0.6 0.7    Studies: I have personally reviewed her CT imaging from yesterday CT ABDOMEN PELVIS W CONTRAST  Result Date: 01/03/2022 CLINICAL DATA:  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 Diffuse abdominal swelling. Fever for 6 days. * Tracking Code: BO * EXAM: CT ABDOMEN AND PELVIS WITH CONTRAST TECHNIQUE:  Multidetector CT imaging of the abdomen and pelvis was performed using the standard protocol following bolus administration of intravenous contrast. RADIATION DOSE REDUCTION: This exam was performed according to the departmental dose-optimization program which includes automated exposure control, adjustment of the mA and/or kV according to patient size and/or use of iterative reconstruction technique. CONTRAST:  20m OMNIPAQUE IOHEXOL 350 MG/ML SOLN COMPARISON:  11/17/2021 FINDINGS: Lower chest: Unremarkable Hepatobiliary: Heterogeneous likely subcapsular mass in the dome of the liver primarily of the right hepatic lobe with speckled internal calcifications, measuring 10.8 by 5.3 by 8.7 cm (volume = 260 cm^3), previously 9.6 by 5.2 by 8.8 cm (volume = 230 cm^3) on 12/07/2021. Lobularity along the margin with the hepatic parenchyma suggests hepatic invasion. Contracted gallbladder. Perihepatic ascites noted. No new hepatic lesion observed. No biliary dilatation. Pancreas: Unremarkable Spleen: Unremarkable Adrenals/Urinary Tract: Suspected 2 mm left kidney lower pole nonobstructive renal calculus, image 33 series 2. Adrenal glands unremarkable. No hydronephrosis. Urinary bladder unremarkable. Stomach/Bowel: Displacement of bowel loops by the large central abdominopelvic tumor. No current dilated bowel. Vascular/Lymphatic: Mild abdominal aortic atherosclerotic vascular disease. Reproductive: Tracking above and around the uterus and filling a substantial portion of the abdominopelvic cavity, we demonstrate a 24.8 by 15.1 by 17.6 cm (volume = 3450 cm^3) heterogeneous mass with scattered calcifications compatible with malignancy. Prior CT abdomen did not include the pelvis and accordingly size wise this is difficult to compare. However, the abdominal component is similar to previous. This may be arising from the ovaries with slight eccentricity to the right in the pelvis. Other: Scattered malignant ascites. A cystic and  solid right omental deposit measures 9.3 by 4.3 cm on image 47 series 2, formerly 8.4 by 3.7 cm. There stranding and some mild nodularity along the rest of the omentum. Perihepatic and perisplenic ascites noted along with ascites tracking in the paracolic gutter and in the pelvis. Musculoskeletal: Unremarkable IMPRESSION: 1. Large central abdominopelvic mass compatible with malignancy, 3450 cubic cm in volume. This may be arising from the ovaries with slight eccentricity to the right in the pelvis. 2. Mild increase in size of the right omental tumor deposit. 3. Mild increase in size of the subcapsular mass along the dome of the right hepatic lobe, with suspected hepatic invasion. 4. Scattered malignant ascites. 5. No dilated bowel to suggest obstruction. 6. 2 mm left kidney lower pole nonobstructive renal calculus. 7. Mild abdominal aortic atherosclerotic vascular disease. Aortic Atherosclerosis (ICD10-I70.0). Electronically Signed   By: WVan ClinesM.D.   On: 01/03/2022 13:52   DG Chest 1 View  Result Date: 01/03/2022 CLINICAL DATA:  Fever of unknown origin. EXAM: CHEST  1 VIEW COMPARISON:  12/30/2021 FINDINGS: There is a right chest wall port a catheter with tip in the projection of the SVC. Normal heart size. No pleural effusion or edema. No airspace opacities identified. Visualized osseous structures are unremarkable. IMPRESSION: No active disease. Electronically Signed   By: TKerby MoorsM.D.   On: 01/03/2022 11:32   DG Chest 2 View  Result Date: 12/30/2021 CLINICAL DATA:  Fever EXAM: CHEST - 2 VIEW COMPARISON:  CT chest done on 12/07/2021 FINDINGS:  Cardiac size is within normal limits. There are no signs of pulmonary edema or focal pulmonary consolidation. There is no pleural effusion or pneumothorax. Tip of right IJ chest port is seen in superior vena cava. IMPRESSION: No active cardiopulmonary disease. Electronically Signed   By: Elmer Picker M.D.   On: 12/30/2021 13:54    ECHOCARDIOGRAM COMPLETE  Result Date: 12/29/2021    ECHOCARDIOGRAM REPORT   Patient Name:   BERENISE HUNTON Date of Exam: 12/29/2021 Medical Rec #:  [id number]      Height:       59.0 in Accession #:    [id number]     Weight:       122.0 lb Date of Birth:  Feb 25, 1970      BSA:          1.495 m Patient Age:    89 years       BP:           100/60 mmHg Patient Gender: F              HR:           107 bpm. Exam Location:  Outpatient Procedure: 2D Echo, 3D Echo, Color Doppler, Cardiac Doppler and Strain Analysis Indications:    R00.0 Tachycardia  History:        Patient has no prior history of Echocardiogram examinations.                 Risk Factors:Non-Smoker. Malignant germ cell tumor of ovary;                 possibility of impingement of the vena cava by her abdominal                 mass.  Sonographer:    Leavy Cella RDCS Referring Phys: [id number] [place]  1. Left ventricular ejection fraction, by estimation, is 55 to 60%. The left ventricle has normal function. The left ventricle has no regional wall motion abnormalities. Indeterminate diastolic filling due to E-A fusion.  2. Right ventricular systolic function is normal. The right ventricular size is normal. Tricuspid regurgitation signal is inadequate for assessing PA pressure.  3. The mitral valve is grossly normal. No evidence of mitral valve regurgitation. No evidence of mitral stenosis.  4. The aortic valve is tricuspid. Aortic valve regurgitation is not visualized. No aortic stenosis is present.  5. The inferior vena cava is normal in size with greater than 50% respiratory variability, suggesting right atrial pressure of 3 mmHg. Conclusion(s)/Recommendation(s): Normal biventricular function without evidence of hemodynamically significant valvular heart disease. FINDINGS  Left Ventricle: Left ventricular ejection fraction, by estimation, is 55 to 60%. The left ventricle has normal function. The left ventricle has no regional wall  motion abnormalities. Global longitudinal strain performed but not reported based on interpreter judgement due to suboptimal tracking. 3D left ventricular ejection fraction analysis performed but not reported based on interpreter judgement due to suboptimal tracking. The left ventricular internal cavity size was normal in size. There is no left ventricular hypertrophy. Indeterminate diastolic filling due to E-A fusion. Right Ventricle: The right ventricular size is normal. No increase in right ventricular wall thickness. Right ventricular systolic function is normal. Tricuspid regurgitation signal is inadequate for assessing PA pressure. Left Atrium: Left atrial size was normal in size. Right Atrium: Right atrial size was normal in size. Pericardium: There is no evidence of pericardial effusion. Mitral Valve: The mitral valve is grossly normal. No evidence of  mitral valve regurgitation. No evidence of mitral valve stenosis. Tricuspid Valve: The tricuspid valve is grossly normal. Tricuspid valve regurgitation is not demonstrated. No evidence of tricuspid stenosis. Aortic Valve: The aortic valve is tricuspid. Aortic valve regurgitation is not visualized. No aortic stenosis is present. Pulmonic Valve: The pulmonic valve was grossly normal. Pulmonic valve regurgitation is not visualized. No evidence of pulmonic stenosis. Aorta: The aortic root and ascending aorta are structurally normal, with no evidence of dilitation. Venous: The inferior vena cava is normal in size with greater than 50% respiratory variability, suggesting right atrial pressure of 3 mmHg. IAS/Shunts: The atrial septum is grossly normal.  LEFT VENTRICLE PLAX 2D LVIDd:         3.95 cm   Diastology LVIDs:         2.14 cm   LV e' medial:    11.20 cm/s LV PW:         0.92 cm   LV E/e' medial:  6.1 LV IVS:        0.74 cm   LV e' lateral:   13.80 cm/s LVOT diam:     2.00 cm   LV E/e' lateral: 5.0 LV SV:         41 LV SV Index:   28 LVOT Area:     3.14 cm                            3D Volume EF:                          3D EF:        50 %                          LV EDV:       191 ml                          LV ESV:       96 ml                          LV SV:        95 ml RIGHT VENTRICLE RV Basal diam:  2.72 cm RV Mid diam:    2.27 cm RV S prime:     18.60 cm/s TAPSE (M-mode): 1.9 cm LEFT ATRIUM           Index        RIGHT ATRIUM          Index LA diam:      2.80 cm 1.87 cm/m   RA Area:     8.16 cm LA Vol (A2C): 8.1 ml  5.44 ml/m   RA Volume:   15.40 ml 10.30 ml/m LA Vol (A4C): 24.4 ml 16.32 ml/m  AORTIC VALVE LVOT Vmax:   95.00 cm/s LVOT Vmean:  59.800 cm/s LVOT VTI:    0.132 m  AORTA Ao Root diam: 2.40 cm Ao Asc diam:  2.40 cm MITRAL VALVE MV Area (PHT): 4.06 cm    SHUNTS MV Decel Time: 187 msec    Systemic VTI:  0.13 m MV E velocity: 68.60 cm/s  Systemic Diam: 2.00 cm MV A velocity: 63.30 cm/s MV E/A ratio:  1.08 Eleonore Chiquito MD Electronically signed by Eleonore Chiquito MD Signature Date/Time: 12/29/2021/4:27:03 PM  Final    IR US Guide Bx Asp/Drain  Result Date: 12/08/2021 INDICATION: 51 year old woman with history of pelvic malignancy presents to IR for biopsy of right upper quadrant satellite lesion. EXAM: Ultrasound-guided biopsy of right upper quadrant satellite lesion MEDICATIONS: None. ANESTHESIA/SEDATION: Moderate (conscious) sedation was employed during this procedure. A total of Versed 1 mg and Fentanyl 50 mcg was administered intravenously. Moderate Sedation Time: 10 minutes. The patient's level of consciousness and vital signs were monitored continuously by radiology nursing throughout the procedure under my direct supervision. FLUOROSCOPY TIME:  None COMPLICATIONS: None immediate. PROCEDURE: Informed written consent was obtained from the patient after a thorough discussion of the procedural risks, benefits and alternatives. All questions were addressed. Maximal Sterile Barrier Technique was utilized including caps, mask, sterile gowns, sterile  gloves, sterile drape, hand hygiene and skin antiseptic. A timeout was performed prior to the initiation of the procedure. Patient position supine on the ultrasound table. Right upper quadrant skin prepped and draped in usual sterile fashion. Following local lidocaine administration, 17 gauge introducer needle was advanced into the right upper quadrant mass, and 4- 18 gauge cores were obtained utilizing continuous ultrasound guidance. Samples were sent to pathology in formalin. Needle removed and hemostasis achieved with 5 minutes of manual compression. Post procedure ultrasound images showed no evidence of significant hemorrhage. IMPRESSION: Ultrasound-guided biopsy of right upper quadrant mass. Electronically Signed   By: Miachel Roux M.D.   On: 12/08/2021 12:37   IR IMAGING GUIDED PORT INSERTION  Result Date: 12/08/2021 INDICATION: Metastatic pelvic malignancy EXAM: IMPLANTED PORT A CATH PLACEMENT WITH ULTRASOUND AND FLUOROSCOPIC GUIDANCE MEDICATIONS: None ANESTHESIA/SEDATION: Moderate (conscious) sedation was employed during this procedure. A total of Versed 2 mg and Fentanyl 150 mcg was administered intravenously by the radiology nurse. Total intra-service moderate Sedation Time: 21 minutes. The patient's level of consciousness and vital signs were monitored continuously by radiology nursing throughout the procedure under my direct supervision. FLUOROSCOPY: Radiation Exposure Index (as provided by the fluoroscopic device): 7 mGy Kerma COMPLICATIONS: None immediate. PROCEDURE: The procedure, risks, benefits, and alternatives were explained to the patient. Questions regarding the procedure were encouraged and answered. The patient understands and consents to the procedure. A timeout was performed prior to the initiation of the procedure. Patient positioned supine on the angiography table. Right neck and anterior upper chest prepped and draped in the usual sterile fashion. All elements of maximal sterile  barrier were utilized including, cap, mask, sterile gown, sterile gloves, large sterile drape, hand scrubbing and 2% Chlorhexidine for skin cleaning. The right internal jugular vein was evaluated with ultrasound and shown to be patent. A permanent ultrasound image was obtained and placed in the patient's medical record. Local anesthesia was provided with 1% lidocaine with epinephrine. Using sterile gel and a sterile probe cover, the right internal jugular vein was entered with a 21 ga needle during real time ultrasound guidance. 0.018 inch guidewire placed and 21 ga needle exchanged for transitional dilator set. Utilizing fluoroscopy, 0.035 inch guidewire advanced centrally without difficulty. Attention then turned to the right anterior upper chest. Following local lidocaine administration, a port pocket was created. The catheter was connected to the port and brought from the pocket to the venotomy site through a subcutaneous tunnel. The catheter was cut to size and inserted through the peel-away sheath. The catheter tip was positioned at the cavoatrial junction using fluoroscopic guidance. The port aspirated and flushed well. The port pocket was closed with deep and superficial absorbable suture. The port pocket incision  and venotomy sites were also sealed with Dermabond. IMPRESSION: Successful placement of a right internal jugular approach power injectable Port-A-Cath. The catheter is ready for immediate use. Electronically Signed   By: Miachel Roux M.D.   On: 12/08/2021 12:36   VAS [place] LOWER EXTREMITY VENOUS (DVT)  Result Date: 12/07/2021  Lower Venous DVT Study Patient Name:  BEVIN DAS  Date of Exam:   12/07/2021 Medical Rec #: [id number]       Accession #:    [id number] Date of Birth: [birth year]       Patient Gender: F Patient Age:   58 years Exam Location:  [facility] Procedure:      VAS [place] LOWER EXTREMITY VENOUS (DVT) Referring Phys: Jeral Pinch  --------------------------------------------------------------------------------  Indications: Abdominopelvic mass with compression of IVC.  Comparison Study: No prior studies. Performing Technologist: Darlin Coco RDMS, RVT  Examination Guidelines: A complete evaluation includes B-mode imaging, spectral Doppler, color Doppler, and power Doppler as needed of all accessible portions of each vessel. Bilateral testing is considered an integral part of a complete examination. Limited examinations for reoccurring indications may be performed as noted. The reflux portion of the exam is performed with the patient in reverse Trendelenburg.  +---------+---------------+---------+-----------+----------+--------------+ RIGHT    CompressibilityPhasicitySpontaneityPropertiesThrombus Aging +---------+---------------+---------+-----------+----------+--------------+ CFV      Full           Yes      Yes                                 +---------+---------------+---------+-----------+----------+--------------+ SFJ      Full                                                        +---------+---------------+---------+-----------+----------+--------------+ FV Prox  Full                                                        +---------+---------------+---------+-----------+----------+--------------+ FV Mid   Full                                                        +---------+---------------+---------+-----------+----------+--------------+ FV DistalFull                                                        +---------+---------------+---------+-----------+----------+--------------+ PFV      Full                                                        +---------+---------------+---------+-----------+----------+--------------+ POP      Full           Yes  Yes                                 +---------+---------------+---------+-----------+----------+--------------+ PTV      Full                                                         +---------+---------------+---------+-----------+----------+--------------+ PERO     Full                                                        +---------+---------------+---------+-----------+----------+--------------+ Gastroc  Full                                                        +---------+---------------+---------+-----------+----------+--------------+   +---------+---------------+---------+-----------+----------+--------------+ LEFT     CompressibilityPhasicitySpontaneityPropertiesThrombus Aging +---------+---------------+---------+-----------+----------+--------------+ CFV      Full           Yes      Yes                                 +---------+---------------+---------+-----------+----------+--------------+ SFJ      Full                                                        +---------+---------------+---------+-----------+----------+--------------+ FV Prox  Full                                                        +---------+---------------+---------+-----------+----------+--------------+ FV Mid   Full                                                        +---------+---------------+---------+-----------+----------+--------------+ FV DistalFull                                                        +---------+---------------+---------+-----------+----------+--------------+ PFV      Full                                                        +---------+---------------+---------+-----------+----------+--------------+ POP      Full  Yes      Yes                                 +---------+---------------+---------+-----------+----------+--------------+ PTV      Full                                                        +---------+---------------+---------+-----------+----------+--------------+ PERO     Full                                                         +---------+---------------+---------+-----------+----------+--------------+ Gastroc  Full                                                        +---------+---------------+---------+-----------+----------+--------------+     Summary: RIGHT: - There is no evidence of deep vein thrombosis in the lower extremity.  - No cystic structure found in the popliteal fossa.  LEFT: - There is no evidence of deep vein thrombosis in the lower extremity.  - No cystic structure found in the popliteal fossa.  *See table(s) above for measurements and observations. Electronically signed by Deitra Mayo MD on 12/07/2021 at 12:56:56 PM.    Final    CT ABDOMEN W CONTRAST  Result Date: 12/07/2021 CLINICAL DATA:  Pelvic mass. Malignant ascites. Metastatic disease evaluation. * Tracking Code: BO * EXAM: CT ABDOMEN WITH CONTRAST TECHNIQUE: Multidetector CT imaging of the abdomen was performed using the standard protocol following bolus administration of intravenous contrast. RADIATION DOSE REDUCTION: This exam was performed according to the departmental dose-optimization program which includes automated exposure control, adjustment of the mA and/or kV according to patient size and/or use of iterative reconstruction technique. CONTRAST:  35m OMNIPAQUE IOHEXOL 350 MG/ML SOLN COMPARISON:  Pelvic MRI 12/02/2021.  Abdominopelvic CT 09/29/2005. FINDINGS: Lower chest:  Chest findings dictated separately. Hepatobiliary: There is a large heterogeneous, partially calcified mass along the superior aspect of the right hepatic lobe which is likely subcapsular in origin based on the reformatted images. This measures approximately 10.3 x 8.0 x 5.1 cm and shows heterogeneous enhancement following contrast. There may be invasion of the liver, especially by a 1.8 cm component on coronal image 45/5. No other separate hepatic lesions. No evidence of gallstones, gallbladder wall thickening or biliary dilatation. Pancreas:  Unremarkable. No pancreatic ductal dilatation or surrounding inflammatory changes. Spleen: Normal in size without focal abnormality. Adrenals/Urinary Tract: Both adrenal glands appear normal. No evidence of urinary tract calculus, suspicious renal lesion or hydronephrosis Bladder not imaged. Stomach/Bowel: No enteric contrast administered. The stomach appears unremarkable for its degree of distention. The visualized bowel demonstrates no wall thickening, distention or surrounding inflammation. The appendix appears normal. Vascular/Lymphatic: There are no enlarged abdominal lymph nodes. Aortic and branch vessel atherosclerosis without evidence of large vessel occlusion or aneurysm. The portal, superior mesenteric and splenic veins appear patent. Other: Small amount of abdominal ascites. As above, there is a large subcapsular metastasis involving  the superior aspect of the liver. There is a right omental implant measuring 6.9 x 3.5 cm on image 28/2. There is a very large central mass involving the upper abdomen and pelvis, incompletely visualized by this CT of the abdomen, but measuring up to 16.5 x 12.2 cm on image 37/2. This was seen on recent pelvic MRI. Musculoskeletal: No acute or significant osseous findings. IMPRESSION: 1. Large central abdominal and pelvic mass consistent with known malignancy. This is incompletely visualized by this CT of the abdomen, but was seen on recent pelvic MRI. 2. 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 Right omental implant consistent with metastatic disease. Small amount of abdominal ascites. 3. No evidence for bowel or ureteral obstruction. 4. Aortic Atherosclerosis (ICD10-I70.0). 5. Chest findings dictated separately. Electronically Signed   By: Richardean Sale M.D.   On: 12/07/2021 11:04   CT Angio Chest Pulmonary Embolism (PE) W or WO Contrast  Result Date: 12/07/2021 CLINICAL DATA:   51 year old female with ovarian cancer.  Cough. EXAM: CT ANGIOGRAPHY CHEST WITH CONTRAST TECHNIQUE: Multidetector CT imaging of the chest was performed using the standard protocol during bolus administration of intravenous contrast. Multiplanar CT image reconstructions and MIPs were obtained to evaluate the vascular anatomy. RADIATION DOSE REDUCTION: This exam was performed according to the departmental dose-optimization program which includes automated exposure control, adjustment of the mA and/or kV according to patient size and/or use of iterative reconstruction technique. CONTRAST:  86m OMNIPAQUE IOHEXOL 350 MG/ML SOLN COMPARISON:  CT Abdomen today reported separately. FINDINGS: Cardiovascular: Excellent contrast bolus timing in the pulmonary arterial tree. Mild respiratory motion. No pulmonary artery filling defect. No calcified coronary artery atherosclerosis is evident. Negative visible aorta. No cardiomegaly or pericardial effusion. Mediastinum/Nodes: No mediastinal mass or lymphadenopathy. Lungs/Pleura: Major airways are patent. Both lungs are clear except for minor dependent atelectasis. No pulmonary nodule, mass, pleural effusion, consolidation. Upper Abdomen: Reported separately today, liver mass and ascites are visible. Musculoskeletal: No acute or suspicious osseous lesion in the chest. Review of the MIP images confirms the above findings. IMPRESSION: 1. Negative for pulmonary embolism. And no acute or metastatic process identified in the Chest. 2. Partially visible liver mass and ascites, staging CT Abdomen today is reported separately. Electronically Signed   By: HGenevie AnnM.D.   On: 12/07/2021 10:54

## 2022-01-05 ENCOUNTER — Telehealth: Payer: Self-pay

## 2022-01-05 ENCOUNTER — Other Ambulatory Visit: Payer: Self-pay | Admitting: Hematology and Oncology

## 2022-01-05 DIAGNOSIS — D709 Neutropenia, unspecified: Secondary | ICD-10-CM | POA: Diagnosis not present

## 2022-01-05 DIAGNOSIS — C801 Malignant (primary) neoplasm, unspecified: Secondary | ICD-10-CM

## 2022-01-05 DIAGNOSIS — R5081 Fever presenting with conditions classified elsewhere: Secondary | ICD-10-CM | POA: Diagnosis not present

## 2022-01-05 LAB — COMPREHENSIVE METABOLIC PANEL
ALT: 14 U/L (ref 0–44)
AST: 21 U/L (ref 15–41)
Albumin: 2 g/dL — ABNORMAL LOW (ref 3.5–5.0)
Alkaline Phosphatase: 136 U/L — ABNORMAL HIGH (ref 38–126)
Anion gap: 8 (ref 5–15)
BUN: 13 mg/dL (ref 6–20)
CO2: 23 mmol/L (ref 22–32)
Calcium: 7.7 mg/dL — ABNORMAL LOW (ref 8.9–10.3)
Chloride: 106 mmol/L (ref 98–111)
Creatinine, Ser: 0.54 mg/dL (ref 0.44–1.00)
GFR, Estimated: >60 mL/min (ref >60)
Glucose, Bld: 87 mg/dL (ref 70–99)
Potassium: 3.9 mmol/L (ref 3.5–5.1)
Sodium: 137 mmol/L (ref 135–145)
Total Bilirubin: 0.9 mg/dL (ref 0.3–1.2)
Total Protein: 5.5 g/dL — ABNORMAL LOW (ref 6.5–8.1)

## 2022-01-05 LAB — CBC
HCT: 24.6 % — ABNORMAL LOW (ref 36.0–46.0)
Hemoglobin: 7.9 g/dL — ABNORMAL LOW (ref 12.0–15.0)
MCH: 27.3 pg (ref 26.0–34.0)
MCHC: 32.1 g/dL (ref 30.0–36.0)
MCV: 85.1 fL (ref 80.0–100.0)
Platelets: 183 10*3/uL (ref 150–400)
RBC: 2.89 MIL/uL — ABNORMAL LOW (ref 3.87–5.11)
RDW: 16.3 % — ABNORMAL HIGH (ref 11.5–15.5)
WBC: 15.2 10*3/uL — ABNORMAL HIGH (ref 4.0–10.5)
nRBC: 0.6 % — ABNORMAL HIGH (ref 0.0–0.2)

## 2022-01-05 LAB — PATHOLOGIST SMEAR REVIEW

## 2022-01-05 LAB — PREPARE RBC (CROSSMATCH)

## 2022-01-05 MED ORDER — FAMOTIDINE 20 MG PO TABS
20.0000 mg | ORAL_TABLET | Freq: Every day | ORAL | Status: DC
  Administered 2022-01-05 – 2022-01-07 (×3): 20 mg via ORAL
  Filled 2022-01-05 (×3): qty 1

## 2022-01-05 MED ORDER — ACETAMINOPHEN 325 MG PO TABS
650.0000 mg | ORAL_TABLET | Freq: Once | ORAL | Status: AC
  Administered 2022-01-05: 650 mg via ORAL
  Filled 2022-01-05: qty 2

## 2022-01-05 MED ORDER — ALUM & MAG HYDROXIDE-SIMETH 200-200-20 MG/5ML PO SUSP
15.0000 mL | ORAL | Status: DC | PRN
  Administered 2022-01-05 – 2022-01-07 (×3): 15 mL via ORAL
  Filled 2022-01-05 (×3): qty 30

## 2022-01-05 MED ORDER — SODIUM CHLORIDE 0.9% IV SOLUTION: Freq: Once | INTRAVENOUS | Status: AC

## 2022-01-05 NOTE — Progress Notes (Signed)
GYN Oncology Progress Note  Subjective: Patient reports no change in abdominal discomfort. Did not sleep well due to staff in and out of room at night and feeling uncomfortable.  Objective: Vital signs in last 24 hours: Temp:  [97.6 F (36.4 C)-101.2 F (38.4 C)] 98.5 F (36.9 C) (11/22 0502) Pulse Rate:  [104-121] 104 (11/22 0502) Resp:  [17-21] 18 (11/22 0502) BP: (100-125)/(61-80) 108/70 (11/22 0502) SpO2:  [96 %-97 %] 97 % (11/22 0502) Weight:  [125 lb 7.1 oz (56.9 kg)] 125 lb 7.1 oz (56.9 kg) (11/22 0500) Last BM Date : 01/05/22  Intake/Output from previous day: 11/21 0701 - 11/22 0700 In: 883.8 [I.V.:253; Blood:330; IV Piggyback:300.8] Out: -   Physical Examination (performed by Dr. Berline Lopes): Alert, oriented, in no acute distress but appears uncomfortable related to abdominal distension. Lungs clear. 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   See addition to note from Dr. Berline Lopes. Plan for probable transfer to [facility] over the next several days/weekend. Risks and concerns about potential surgery discussed with patient and father by Dr. Berline Lopes. Additional chemotherapy also discussed if infection can be ruled out as cause of fevers.    LOS: 2 days    [name] 01/05/2022, 9:56 AM

## 2022-01-05 NOTE — Progress Notes (Signed)
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 She had outpatient fevers and was started on Augmentin per oncology.  Due to ongoing fevers she presented for further workup and evaluation.   Upon evaluation in the emergency department, patient has been found to be neutropenic and leukopenic with multiple SIRS criteria including temp 101.7.  EDP discussed case with Dr. Alvy Palmer who recommended admission, infectious work-up including repeat CT imaging of the abdomen pelvis and initiation of intravenous antibiotics.   Interval History:  No events overnight.  Husband present bedside this morning.  Patient endorsing ongoing diarrhea and some baseline abdominal discomfort. Tentative plan remains pursuing transfer to [facility].  Assessment and Plan:  Neutropenic fever -Presented with 1 week hx of fever with tmax 101.3, ANC of 300  - differential on admission included infection vs chemo induced fever vs malignancy associated - 11/20 blood cultures remain negative - UA negative for signs of infection - started on cefepime on admission - s/p outpt Augmentin; patient complaining of diarrhea as well on admission; no abd pain aside from tumor burden associated pain - diarrhea likely abx induced, but given neutropenic fever, will check stool studies  Diarrhea - see above   Anemia due to chemo and possibly from tumor - Hgb 6.4 g/dL on admission - s/p 1 unit PRBC 11/21 - continue trending Hgb   Dehydration -s/p IVF at time of presentation  Generalized weakness -1 week of generalized weakness in the absence of any acute focal weakness.   -Cont  to encourage ambulation as tolerated   Generalized anxiety disorder -Continue ativan as needed -Stable at this time -continue zofran PRN nausea. Have added compazine for breakthrough nausea as well, per pt wishes   Hypokalemia - replete as needed    Old records reviewed in assessment of this patient  Antimicrobials: Cefepime 11/20 >> current   DVT prophylaxis:  SCDs Start: 01/03/22 1922   Code Status:   Code Status: Full Code  Mobility Assessment (last 72 hours)     Mobility Assessment     Row Name 01/05/22 0848 01/04/22 2002 01/04/22 1146 01/03/22 1957     Does patient have an order for bedrest or is patient medically unstable No - Continue assessment No - Continue assessment No - Continue assessment No - Continue assessment    What is the highest level of mobility based on the progressive mobility assessment? Level 6 (Walks independently in room and hall) - Balance while walking in room without assist - Complete Level 6 (Walks independently in room and hall) - Balance while walking in room without assist - Complete Level 6 (Walks independently in room and hall) - Balance while walking in room without assist - Complete Level 6 (Walks independently in room and hall) - Balance while walking in room without assist - Complete             Barriers to discharge:  Disposition Plan:  Tx to [facility] Status is: Inpt  Objective: Blood pressure 124/85, pulse (!) 108, temperature 98.5 F (36.9 C), temperature source Oral, resp. rate 20, height '4\' 11"'$  (1.499 m), weight 56.9 kg, SpO2 98 %.  Examination:  Physical Exam Constitutional:      General: She is not in acute distress.    Appearance: Normal appearance.  HENT:     Head: Normocephalic and atraumatic.     Mouth/Throat:     Mouth: Mucous membranes are moist.  Eyes:     Extraocular Movements: Extraocular movements intact.  Cardiovascular:     Rate and Rhythm: Normal rate and regular rhythm.  Pulmonary:     Effort: Pulmonary  effort is normal.     Breath sounds: Normal breath sounds.  Abdominal:     Comments: Distended, asymmetric from underlying tumor burden; nonspecific TTP throughout with no R/G; bowel sounds present   Musculoskeletal:        General: Swelling present. Normal range of motion.     Cervical back: Normal range of motion and neck supple.  Skin:    General: Skin is warm and dry.  Neurological:     General: No focal deficit present.     Mental Status: She is alert.  Psychiatric:        Mood and Affect: Mood normal.      Consultants:  Oncology Gyn-oncology   Procedures:    Data Reviewed: Results for orders placed or performed during the hospital encounter of 01/03/22 (from the past 24 hour(s))  Hemoglobin and hematocrit, blood     Status: Abnormal   Collection Time: 01/04/22  7:05 PM  Result Value Ref Range   Hemoglobin 7.9 (L) 12.0 - 15.0 g/dL   HCT 24.8 (L) 36.0 - 46.0 %  Comprehensive metabolic panel     Status: Abnormal   Collection Time: 01/05/22  3:19 AM  Result Value Ref Range   Sodium 137 135 - 145 mmol/L   Potassium 3.9 3.5 - 5.1 mmol/L   Chloride 106 98 - 111 mmol/L   CO2 23 22 - 32 mmol/L   Glucose, Bld 87 70 - 99 mg/dL   BUN 13 6 - 20 mg/dL   Creatinine, Ser 0.54 0.44 - 1.00 mg/dL   Calcium 7.7 (L) 8.9 - 10.3 mg/dL   Total Protein 5.5 (L) 6.5 - 8.1 g/dL   Albumin 2.0 (L) 3.5 - 5.0 g/dL   AST 21 15 - 41 U/L   ALT 14 0 - 44 U/L   Alkaline Phosphatase 136 (H) 38 - 126 U/L   Total Bilirubin 0.9 0.3 - 1.2 mg/dL   GFR, Estimated >60 >60 mL/min   Anion gap 8 5 - 15  CBC     Status: Abnormal   Collection Time: 01/05/22  3:19 AM  Result Value Ref Range   WBC 15.2 (H) 4.0 - 10.5 K/uL   RBC 2.89 (L) 3.87 - 5.11 MIL/uL   Hemoglobin 7.9 (L) 12.0 - 15.0 g/dL   HCT 24.6 (L) 36.0 - 46.0 %   MCV 85.1 80.0 - 100.0 fL   MCH 27.3 26.0 - 34.0 pg   MCHC 32.1 30.0 - 36.0 g/dL   RDW 16.3 (H) 11.5 - 15.5 %   Platelets 183 150 - 400 K/uL   nRBC 0.6 (H) 0.0 - 0.2 %  Prepare RBC  (crossmatch)     Status: None   Collection Time: 01/05/22 10:09 AM  Result Value Ref Range   Order Confirmation      ORDER PROCESSED BY BLOOD BANK Performed at [facility], [address], [place], [place] [address]     I have Reviewed nursing notes, Vitals, and Lab results since pt's last encounter. Pertinent  lab results : see above I have ordered test including BMP, CBC, Mg I have reviewed the last note from staff over past 24 hours I have discussed pt's care plan and test results with nursing staff, case manager   LOS: 2 days   Dwyane Dee, MD [facility] 01/05/2022, 3:01 PM

## 2022-01-05 NOTE — Progress Notes (Signed)
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 Berline Lopes and agree for her to be transferred to tertiary center at [facility] for further management I will cancel her outpatient follow-up next week   Acquired pancytopenia A component of hemodilution and pancytopenia from recent chemo We discussed some of the risks, benefits, and alternatives of blood transfusions. The patient is symptomatic from anemia and the hemoglobin level is critically low.  Some of the side-effects to be expected including risks of transfusion reactions, chills, infection, syndrome of volume overload and risk of hospitalization from various reasons and the patient is willing to proceed and went ahead to sign consent today. I will arrange for a unit of blood today   Neutropenic fever Neutropenia has resolved Continue antibiotics for 2 more days until culture negative.  All the cultures I drew last week were negative   Antibiotic associated diarrhea This is due to antibiotics related Observe closely, plan to DC antibiotics if all cultures remained negative   Mild electrolyte imbalance Related to treatment and recent diarrhea Observe   Discharge planning She will likely be here for the next 48 hours  All questions were answered. The patient knows to call the clinic with any problems, questions or concerns.   The total time spent in the appointment was 30 minutes encounter with patients including review of chart and various tests results, discussions about plan of care and coordination of care plan  Heath Lark, MD 01/05/2022 8:55 AM  Subjective:  I have met with her, her husband and father today. She denies pain. She has poor sleep. Denies bleeding.  We spent majority of the plan reviewing plan of care  Objective:  Vitals:   01/05/22 0136 01/05/22 0502  BP:  108/70  Pulse:  (!) 104  Resp: (!) 21 18  Temp: 97.6 F (36.4 C) 98.5 F (36.9 C)  SpO2:  97%     Intake/Output Summary (Last 24 hours) at 01/05/2022 0855 Last data filed at 01/05/2022 8502 Gross per 24 hour  Intake 883.83 ml  Output --  Net 883.83 ml    GENERAL:alert, no distress and comfortable  NEURO: alert & oriented x 3 with fluent speech, no focal motor/sensory deficits   Labs:  Recent Labs    01/03/22 1047 01/04/22 0457 01/05/22 0319  NA 138 136 137  K 3.6 3.1* 3.9  CL 98 104 106  CO2 27 21* 23  GLUCOSE 108* 79 87  BUN _0 CREATININE 0.53 0.49 0.54  CALCIUM 9.3 7.8* 7.7*  GFRNONAA >60 >60 >60  PROT 6.7 5.5* 5.5*  ALBUMIN 3.3* 2.1* 2.0*  AST 11* 16 21  ALT _1 ALKPHOS 135* 126 136*  BILITOT 0.6 0.7 0.9    Studies:  CT ABDOMEN PELVIS W CONTRAST  Result Date: 01/03/2022 CLINICAL DATA:  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 Diffuse abdominal swelling. Fever for 6 days. * Tracking Code: BO * EXAM: CT ABDOMEN AND PELVIS WITH CONTRAST TECHNIQUE: Multidetector CT imaging of the abdomen and pelvis was performed using the standard protocol following bolus administration of intravenous contrast. RADIATION DOSE REDUCTION: This exam was performed according to the departmental dose-optimization program  which includes automated exposure control, adjustment of the mA and/or kV according to patient size and/or use of iterative reconstruction technique. CONTRAST:  27m OMNIPAQUE IOHEXOL 350 MG/ML SOLN COMPARISON:  11/17/2021 FINDINGS: Lower chest: Unremarkable Hepatobiliary: Heterogeneous likely subcapsular mass in the dome of the liver primarily of the right hepatic lobe with speckled internal calcifications, measuring 10.8 by 5.3 by 8.7 cm (volume = 260 cm^3), previously 9.6 by 5.2 by 8.8 cm (volume = 230  cm^3) on 12/07/2021. Lobularity along the margin with the hepatic parenchyma suggests hepatic invasion. Contracted gallbladder. Perihepatic ascites noted. No new hepatic lesion observed. No biliary dilatation. Pancreas: Unremarkable Spleen: Unremarkable Adrenals/Urinary Tract: Suspected 2 mm left kidney lower pole nonobstructive renal calculus, image 33 series 2. Adrenal glands unremarkable. No hydronephrosis. Urinary bladder unremarkable. Stomach/Bowel: Displacement of bowel loops by the large central abdominopelvic tumor. No current dilated bowel. Vascular/Lymphatic: Mild abdominal aortic atherosclerotic vascular disease. Reproductive: Tracking above and around the uterus and filling a substantial portion of the abdominopelvic cavity, we demonstrate a 24.8 by 15.1 by 17.6 cm (volume = 3450 cm^3) heterogeneous mass with scattered calcifications compatible with malignancy. Prior CT abdomen did not include the pelvis and accordingly size wise this is difficult to compare. However, the abdominal component is similar to previous. This may be arising from the ovaries with slight eccentricity to the right in the pelvis. Other: Scattered malignant ascites. A cystic and solid right omental deposit measures 9.3 by 4.3 cm on image 47 series 2, formerly 8.4 by 3.7 cm. There stranding and some mild nodularity along the rest of the omentum. Perihepatic and perisplenic ascites noted along with ascites tracking in the paracolic gutter and in the pelvis. Musculoskeletal: Unremarkable IMPRESSION: 1. Large central abdominopelvic mass compatible with malignancy, 3450 cubic cm in volume. This may be arising from the ovaries with slight eccentricity to the right in the pelvis. 2. Mild increase in size of the right omental tumor deposit. 3. Mild increase in size of the subcapsular mass along the dome of the right hepatic lobe, with suspected hepatic invasion. 4. Scattered malignant ascites. 5. No dilated bowel to suggest obstruction. 6.  2 mm left kidney lower pole nonobstructive renal calculus. 7. Mild abdominal aortic atherosclerotic vascular disease. Aortic Atherosclerosis (ICD10-I70.0). Electronically Signed   By: WVan ClinesM.D.   On: 01/03/2022 13:52   DG Chest 1 View  Result Date: 01/03/2022 CLINICAL DATA:  Fever of unknown origin. EXAM: CHEST  1 VIEW COMPARISON:  12/30/2021 FINDINGS: There is a right chest wall port a catheter with tip in the projection of the SVC. Normal heart size. No pleural effusion or edema. No airspace opacities identified. Visualized osseous structures are unremarkable. IMPRESSION: No active disease. Electronically Signed   By: TKerby MoorsM.D.   On: 01/03/2022 11:32   DG Chest 2 View  Result Date: 12/30/2021 CLINICAL DATA:  Fever EXAM: CHEST - 2 VIEW COMPARISON:  CT chest done on 12/07/2021 FINDINGS: Cardiac size is within normal limits. There are no signs of pulmonary edema or focal pulmonary consolidation. There is no pleural effusion or pneumothorax. Tip of right IJ chest port is seen in superior vena cava. IMPRESSION: No active cardiopulmonary disease. Electronically Signed   By: PElmer PickerM.D.   On: 12/30/2021 13:54   ECHOCARDIOGRAM COMPLETE  Result Date: 12/29/2021    ECHOCARDIOGRAM REPORT   Patient Name:   AINOLA LISLEDate of Exam: 12/29/2021 Medical Rec #:  [id number]     Height:  59.0 in Accession #:    [id number]     Weight:       122.0 lb Date of Birth:  Feb 18, 1970      BSA:          1.495 m Patient Age:    45 years       BP:           100/60 mmHg Patient Gender: F              HR:           107 bpm. Exam Location:  Outpatient Procedure: 2D Echo, 3D Echo, Color Doppler, Cardiac Doppler and Strain Analysis Indications:    R00.0 Tachycardia  History:        Patient has no prior history of Echocardiogram examinations.                 Risk Factors:Non-Smoker. Malignant germ cell tumor of ovary;                 possibility of impingement of the vena cava by her  abdominal                 mass.  Sonographer:    Leavy Cella RDCS Referring Phys: [id number] Lena  1. Left ventricular ejection fraction, by estimation, is 55 to 60%. The left ventricle has normal function. The left ventricle has no regional wall motion abnormalities. Indeterminate diastolic filling due to E-A fusion.  2. Right ventricular systolic function is normal. The right ventricular size is normal. Tricuspid regurgitation signal is inadequate for assessing PA pressure.  3. The mitral valve is grossly normal. No evidence of mitral valve regurgitation. No evidence of mitral stenosis.  4. The aortic valve is tricuspid. Aortic valve regurgitation is not visualized. No aortic stenosis is present.  5. The inferior vena cava is normal in size with greater than 50% respiratory variability, suggesting right atrial pressure of 3 mmHg. Conclusion(s)/Recommendation(s): Normal biventricular function without evidence of hemodynamically significant valvular heart disease. FINDINGS  Left Ventricle: Left ventricular ejection fraction, by estimation, is 55 to 60%. The left ventricle has normal function. The left ventricle has no regional wall motion abnormalities. Global longitudinal strain performed but not reported based on interpreter judgement due to suboptimal tracking. 3D left ventricular ejection fraction analysis performed but not reported based on interpreter judgement due to suboptimal tracking. The left ventricular internal cavity size was normal in size. There is no left ventricular hypertrophy. Indeterminate diastolic filling due to E-A fusion. Right Ventricle: The right ventricular size is normal. No increase in right ventricular wall thickness. Right ventricular systolic function is normal. Tricuspid regurgitation signal is inadequate for assessing PA pressure. Left Atrium: Left atrial size was normal in size. Right Atrium: Right atrial size was normal in size. Pericardium: There is no  evidence of pericardial effusion. Mitral Valve: The mitral valve is grossly normal. No evidence of mitral valve regurgitation. No evidence of mitral valve stenosis. Tricuspid Valve: The tricuspid valve is grossly normal. Tricuspid valve regurgitation is not demonstrated. No evidence of tricuspid stenosis. Aortic Valve: The aortic valve is tricuspid. Aortic valve regurgitation is not visualized. No aortic stenosis is present. Pulmonic Valve: The pulmonic valve was grossly normal. Pulmonic valve regurgitation is not visualized. No evidence of pulmonic stenosis. Aorta: The aortic root and ascending aorta are structurally normal, with no evidence of dilitation. Venous: The inferior vena cava is normal in size with greater than 50% respiratory variability, suggesting right atrial  pressure of 3 mmHg. IAS/Shunts: The atrial septum is grossly normal.  LEFT VENTRICLE PLAX 2D LVIDd:         3.95 cm   Diastology LVIDs:         2.14 cm   LV e' medial:    11.20 cm/s LV PW:         0.92 cm   LV E/e' medial:  6.1 LV IVS:        0.74 cm   LV e' lateral:   13.80 cm/s LVOT diam:     2.00 cm   LV E/e' lateral: 5.0 LV SV:         41 LV SV Index:   28 LVOT Area:     3.14 cm                           3D Volume EF:                          3D EF:        50 %                          LV EDV:       191 ml                          LV ESV:       96 ml                          LV SV:        95 ml RIGHT VENTRICLE RV Basal diam:  2.72 cm RV Mid diam:    2.27 cm RV S prime:     18.60 cm/s TAPSE (M-mode): 1.9 cm LEFT ATRIUM           Index        RIGHT ATRIUM          Index LA diam:      2.80 cm 1.87 cm/m   RA Area:     8.16 cm LA Vol (A2C): 8.1 ml  5.44 ml/m   RA Volume:   15.40 ml 10.30 ml/m LA Vol (A4C): 24.4 ml 16.32 ml/m  AORTIC VALVE LVOT Vmax:   95.00 cm/s LVOT Vmean:  59.800 cm/s LVOT VTI:    0.132 m  AORTA Ao Root diam: 2.40 cm Ao Asc diam:  2.40 cm MITRAL VALVE MV Area (PHT): 4.06 cm    SHUNTS MV Decel Time: 187 msec    Systemic  VTI:  0.13 m MV E velocity: 68.60 cm/s  Systemic Diam: 2.00 cm MV A velocity: 63.30 cm/s MV E/A ratio:  1.08 Eleonore Chiquito MD Electronically signed by Eleonore Chiquito MD Signature Date/Time: 12/29/2021/4:27:03 PM    Final    IR US Guide Bx Asp/Drain  Result Date: 12/08/2021 INDICATION: 51 year old woman with history of pelvic malignancy presents to IR for biopsy of right upper quadrant satellite lesion. EXAM: Ultrasound-guided biopsy of right upper quadrant satellite lesion MEDICATIONS: None. ANESTHESIA/SEDATION: Moderate (conscious) sedation was employed during this procedure. A total of Versed 1 mg and Fentanyl 50 mcg was administered intravenously. Moderate Sedation Time: 10 minutes. The patient's level of consciousness and vital signs were monitored continuously by radiology nursing throughout the procedure under my direct supervision. FLUOROSCOPY TIME:  None COMPLICATIONS: None immediate.  PROCEDURE: Informed written consent was obtained from the patient after a thorough discussion of the procedural risks, benefits and alternatives. All questions were addressed. Maximal Sterile Barrier Technique was utilized including caps, mask, sterile gowns, sterile gloves, sterile drape, hand hygiene and skin antiseptic. A timeout was performed prior to the initiation of the procedure. Patient position supine on the ultrasound table. Right upper quadrant skin prepped and draped in usual sterile fashion. Following local lidocaine administration, 17 gauge introducer needle was advanced into the right upper quadrant mass, and 4- 18 gauge cores were obtained utilizing continuous ultrasound guidance. Samples were sent to pathology in formalin. Needle removed and hemostasis achieved with 5 minutes of manual compression. Post procedure ultrasound images showed no evidence of significant hemorrhage. IMPRESSION: Ultrasound-guided biopsy of right upper quadrant mass. Electronically Signed   By: Miachel Roux M.D.   On: 12/08/2021  12:37   IR IMAGING GUIDED PORT INSERTION  Result Date: 12/08/2021 INDICATION: Metastatic pelvic malignancy EXAM: IMPLANTED PORT A CATH PLACEMENT WITH ULTRASOUND AND FLUOROSCOPIC GUIDANCE MEDICATIONS: None ANESTHESIA/SEDATION: Moderate (conscious) sedation was employed during this procedure. A total of Versed 2 mg and Fentanyl 150 mcg was administered intravenously by the radiology nurse. Total intra-service moderate Sedation Time: 21 minutes. The patient's level of consciousness and vital signs were monitored continuously by radiology nursing throughout the procedure under my direct supervision. FLUOROSCOPY: Radiation Exposure Index (as provided by the fluoroscopic device): 7 mGy Kerma COMPLICATIONS: None immediate. PROCEDURE: The procedure, risks, benefits, and alternatives were explained to the patient. Questions regarding the procedure were encouraged and answered. The patient understands and consents to the procedure. A timeout was performed prior to the initiation of the procedure. Patient positioned supine on the angiography table. Right neck and anterior upper chest prepped and draped in the usual sterile fashion. All elements of maximal sterile barrier were utilized including, cap, mask, sterile gown, sterile gloves, large sterile drape, hand scrubbing and 2% Chlorhexidine for skin cleaning. The right internal jugular vein was evaluated with ultrasound and shown to be patent. A permanent ultrasound image was obtained and placed in the patient's medical record. Local anesthesia was provided with 1% lidocaine with epinephrine. Using sterile gel and a sterile probe cover, the right internal jugular vein was entered with a 21 ga needle during real time ultrasound guidance. 0.018 inch guidewire placed and 21 ga needle exchanged for transitional dilator set. Utilizing fluoroscopy, 0.035 inch guidewire advanced centrally without difficulty. Attention then turned to the right anterior upper chest. Following  local lidocaine administration, a port pocket was created. The catheter was connected to the port and brought from the pocket to the venotomy site through a subcutaneous tunnel. The catheter was cut to size and inserted through the peel-away sheath. The catheter tip was positioned at the cavoatrial junction using fluoroscopic guidance. The port aspirated and flushed well. The port pocket was closed with deep and superficial absorbable suture. The port pocket incision and venotomy sites were also sealed with Dermabond. IMPRESSION: Successful placement of a right internal jugular approach power injectable Port-A-Cath. The catheter is ready for immediate use. Electronically Signed   By: Miachel Roux M.D.   On: 12/08/2021 12:36   VAS [place] LOWER EXTREMITY VENOUS (DVT)  Result Date: 12/07/2021  Lower Venous DVT Study Patient Name:  CLEOTA PELLERITO  Date of Exam:   12/07/2021 Medical Rec #: [id number]       Accession #:    [id number] Date of Birth: Dec 21, 1970       Patient  Gender: F Patient Age:   38 years Exam Location:  [facility] Procedure:      VAS [place] LOWER EXTREMITY VENOUS (DVT) Referring Phys: Jeral Pinch --------------------------------------------------------------------------------  Indications: Abdominopelvic mass with compression of IVC.  Comparison Study: No prior studies. Performing Technologist: Darlin Coco RDMS, RVT  Examination Guidelines: A complete evaluation includes B-mode imaging, spectral Doppler, color Doppler, and power Doppler as needed of all accessible portions of each vessel. Bilateral testing is considered an integral part of a complete examination. Limited examinations for reoccurring indications may be performed as noted. The reflux portion of the exam is performed with the patient in reverse Trendelenburg.  +---------+---------------+---------+-----------+----------+--------------+ RIGHT    CompressibilityPhasicitySpontaneityPropertiesThrombus Aging  +---------+---------------+---------+-----------+----------+--------------+ CFV      Full           Yes      Yes                                 +---------+---------------+---------+-----------+----------+--------------+ SFJ      Full                                                        +---------+---------------+---------+-----------+----------+--------------+ FV Prox  Full                                                        +---------+---------------+---------+-----------+----------+--------------+ FV Mid   Full                                                        +---------+---------------+---------+-----------+----------+--------------+ FV DistalFull                                                        +---------+---------------+---------+-----------+----------+--------------+ PFV      Full                                                        +---------+---------------+---------+-----------+----------+--------------+ POP      Full           Yes      Yes                                 +---------+---------------+---------+-----------+----------+--------------+ PTV      Full                                                        +---------+---------------+---------+-----------+----------+--------------+  PERO     Full                                                        +---------+---------------+---------+-----------+----------+--------------+ Gastroc  Full                                                        +---------+---------------+---------+-----------+----------+--------------+   +---------+---------------+---------+-----------+----------+--------------+ LEFT     CompressibilityPhasicitySpontaneityPropertiesThrombus Aging +---------+---------------+---------+-----------+----------+--------------+ CFV      Full           Yes      Yes                                  +---------+---------------+---------+-----------+----------+--------------+ SFJ      Full                                                        +---------+---------------+---------+-----------+----------+--------------+ FV Prox  Full                                                        +---------+---------------+---------+-----------+----------+--------------+ FV Mid   Full                                                        +---------+---------------+---------+-----------+----------+--------------+ FV DistalFull                                                        +---------+---------------+---------+-----------+----------+--------------+ PFV      Full                                                        +---------+---------------+---------+-----------+----------+--------------+ POP      Full           Yes      Yes                                 +---------+---------------+---------+-----------+----------+--------------+ PTV      Full                                                        +---------+---------------+---------+-----------+----------+--------------+  PERO     Full                                                        +---------+---------------+---------+-----------+----------+--------------+ Gastroc  Full                                                        +---------+---------------+---------+-----------+----------+--------------+     Summary: RIGHT: - There is no evidence of deep vein thrombosis in the lower extremity.  - No cystic structure found in the popliteal fossa.  LEFT: - There is no evidence of deep vein thrombosis in the lower extremity.  - No cystic structure found in the popliteal fossa.  *See table(s) above for measurements and observations. Electronically signed by Deitra Mayo MD on 12/07/2021 at 12:56:56 PM.    Final    CT ABDOMEN W CONTRAST  Result Date: 12/07/2021 CLINICAL DATA:  Pelvic mass.  Malignant ascites. Metastatic disease evaluation. * Tracking Code: BO * EXAM: CT ABDOMEN WITH CONTRAST TECHNIQUE: Multidetector CT imaging of the abdomen was performed using the standard protocol following bolus administration of intravenous contrast. RADIATION DOSE REDUCTION: This exam was performed according to the departmental dose-optimization program which includes automated exposure control, adjustment of the mA and/or kV according to patient size and/or use of iterative reconstruction technique. CONTRAST:  15m OMNIPAQUE IOHEXOL 350 MG/ML SOLN COMPARISON:  Pelvic MRI 12/02/2021.  Abdominopelvic CT 09/29/2005. FINDINGS: Lower chest:  Chest findings dictated separately. Hepatobiliary: There is a large heterogeneous, partially calcified mass along the superior aspect of the right hepatic lobe which is likely subcapsular in origin based on the reformatted images. This measures approximately 10.3 x 8.0 x 5.1 cm and shows heterogeneous enhancement following contrast. There may be invasion of the liver, especially by a 1.8 cm component on coronal image 45/5. No other separate hepatic lesions. No evidence of gallstones, gallbladder wall thickening or biliary dilatation. Pancreas: Unremarkable. No pancreatic ductal dilatation or surrounding inflammatory changes. Spleen: Normal in size without focal abnormality. Adrenals/Urinary Tract: Both adrenal glands appear normal. No evidence of urinary tract calculus, suspicious renal lesion or hydronephrosis Bladder not imaged. Stomach/Bowel: No enteric contrast administered. The stomach appears unremarkable for its degree of distention. The visualized bowel demonstrates no wall thickening, distention or surrounding inflammation. The appendix appears normal. Vascular/Lymphatic: There are no enlarged abdominal lymph nodes. Aortic and branch vessel atherosclerosis without evidence of large vessel occlusion or aneurysm. The portal, superior mesenteric and splenic veins appear  patent. Other: Small amount of abdominal ascites. As above, there is a large subcapsular metastasis involving the superior aspect of the liver. There is a right omental implant measuring 6.9 x 3.5 cm on image 28/2. There is a very large central mass involving the upper abdomen and pelvis, incompletely visualized by this CT of the abdomen, but measuring up to 16.5 x 12.2 cm on image 37/2. This was seen on recent pelvic MRI. Musculoskeletal: No acute or significant osseous findings. IMPRESSION: 1. Large central abdominal and pelvic mass consistent with known malignancy. This is incompletely visualized by this CT of the abdomen, but was seen on recent pelvic MRI. 2. 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 Right omental implant consistent with metastatic disease. Small amount of abdominal ascites. 3. No evidence for bowel or ureteral obstruction. 4. Aortic Atherosclerosis (ICD10-I70.0). 5. Chest findings dictated separately. Electronically Signed   By: Richardean Sale M.D.   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  Cough. EXAM: CT ANGIOGRAPHY CHEST WITH CONTRAST TECHNIQUE: Multidetector CT imaging of the chest was performed using the standard protocol during bolus administration of intravenous contrast. Multiplanar CT image reconstructions and MIPs were obtained to evaluate the vascular anatomy. RADIATION DOSE REDUCTION: This exam was performed according to the departmental dose-optimization program which includes automated exposure control, adjustment of the mA and/or kV according to patient size and/or use of iterative reconstruction technique. CONTRAST:  73m OMNIPAQUE IOHEXOL 350 MG/ML SOLN COMPARISON:  CT Abdomen today reported separately. FINDINGS: Cardiovascular: Excellent contrast bolus timing in the  pulmonary arterial tree. Mild respiratory motion. No pulmonary artery filling defect. No calcified coronary artery atherosclerosis is evident. Negative visible aorta. No cardiomegaly or pericardial effusion. Mediastinum/Nodes: No mediastinal mass or lymphadenopathy. Lungs/Pleura: Major airways are patent. Both lungs are clear except for minor dependent atelectasis. No pulmonary nodule, mass, pleural effusion, consolidation. Upper Abdomen: Reported separately today, liver mass and ascites are visible. Musculoskeletal: No acute or suspicious osseous lesion in the chest. Review of the MIP images confirms the above findings. IMPRESSION: 1. Negative for pulmonary embolism. And no acute or metastatic process identified in the Chest. 2. Partially visible liver mass and ascites, staging CT Abdomen today is reported separately. Electronically Signed   By: HGenevie AnnM.D.   On: 12/07/2021 10:54

## 2022-01-05 NOTE — Telephone Encounter (Signed)
Per Dr Berline Lopes called Barbera Setters at [facility] to get this patient a [facility] number.  [facility] number is #[phone] per Barbera Setters.  Sent message to Dr Berline Lopes to let her know the [facility] number.

## 2022-01-06 DIAGNOSIS — G893 Neoplasm related pain (acute) (chronic): Secondary | ICD-10-CM

## 2022-01-06 DIAGNOSIS — C569 Malignant neoplasm of unspecified ovary: Secondary | ICD-10-CM

## 2022-01-06 DIAGNOSIS — D709 Neutropenia, unspecified: Secondary | ICD-10-CM | POA: Diagnosis not present

## 2022-01-06 DIAGNOSIS — R5081 Fever presenting with conditions classified elsewhere: Secondary | ICD-10-CM | POA: Diagnosis not present

## 2022-01-06 LAB — COMPREHENSIVE METABOLIC PANEL
ALT: 19 U/L (ref 0–44)
AST: 24 U/L (ref 15–41)
Albumin: 1.8 g/dL — ABNORMAL LOW (ref 3.5–5.0)
Alkaline Phosphatase: 146 U/L — ABNORMAL HIGH (ref 38–126)
Anion gap: 6 (ref 5–15)
BUN: 14 mg/dL (ref 6–20)
CO2: 23 mmol/L (ref 22–32)
Calcium: 7.4 mg/dL — ABNORMAL LOW (ref 8.9–10.3)
Chloride: 105 mmol/L (ref 98–111)
Creatinine, Ser: 0.65 mg/dL (ref 0.44–1.00)
GFR, Estimated: >60 mL/min (ref >60)
Glucose, Bld: 102 mg/dL — ABNORMAL HIGH (ref 70–99)
Potassium: 3.3 mmol/L — ABNORMAL LOW (ref 3.5–5.1)
Sodium: 134 mmol/L — ABNORMAL LOW (ref 135–145)
Total Bilirubin: 0.7 mg/dL (ref 0.3–1.2)
Total Protein: 5.1 g/dL — ABNORMAL LOW (ref 6.5–8.1)

## 2022-01-06 LAB — CBC WITH DIFFERENTIAL/PLATELET
Abs Immature Granulocytes: 8.87 10*3/uL — ABNORMAL HIGH (ref 0.00–0.07)
Basophils Absolute: 0.1 10*3/uL (ref 0.0–0.1)
Basophils Relative: 0 %
Eosinophils Absolute: 0 10*3/uL (ref 0.0–0.5)
Eosinophils Relative: 0 %
HCT: 26.1 % — ABNORMAL LOW (ref 36.0–46.0)
Hemoglobin: 8.4 g/dL — ABNORMAL LOW (ref 12.0–15.0)
Immature Granulocytes: 29 %
Lymphocytes Relative: 8 %
Lymphs Abs: 2.5 10*3/uL (ref 0.7–4.0)
MCH: 26.9 pg (ref 26.0–34.0)
MCHC: 32.2 g/dL (ref 30.0–36.0)
MCV: 83.7 fL (ref 80.0–100.0)
Monocytes Absolute: 3 10*3/uL — ABNORMAL HIGH (ref 0.1–1.0)
Monocytes Relative: 10 %
Neutro Abs: 16.2 10*3/uL — ABNORMAL HIGH (ref 1.7–7.7)
Neutrophils Relative %: 53 %
Platelets: 185 10*3/uL (ref 150–400)
RBC: 3.12 MIL/uL — ABNORMAL LOW (ref 3.87–5.11)
RDW: 16.8 % — ABNORMAL HIGH (ref 11.5–15.5)
WBC: 30.5 10*3/uL — ABNORMAL HIGH (ref 4.0–10.5)
nRBC: 0.4 % — ABNORMAL HIGH (ref 0.0–0.2)

## 2022-01-06 LAB — BPAM RBC
Blood Product Expiration Date: 202311282359
Blood Product Expiration Date: 202311302359
ISSUE DATE / TIME: 202311211131
ISSUE DATE / TIME: 202311221110
Unit Type and Rh: 7300
Unit Type and Rh: 7300

## 2022-01-06 LAB — HEMOGLOBIN AND HEMATOCRIT, BLOOD
HCT: 25.9 % — ABNORMAL LOW (ref 36.0–46.0)
Hemoglobin: 8.5 g/dL — ABNORMAL LOW (ref 12.0–15.0)

## 2022-01-06 LAB — TYPE AND SCREEN
ABO/RH(D): B POS
Antibody Screen: NEGATIVE
Unit division: 0
Unit division: 0

## 2022-01-06 LAB — MAGNESIUM: Magnesium: 1.9 mg/dL (ref 1.7–2.4)

## 2022-01-06 MED ORDER — SIMETHICONE 80 MG PO CHEW
80.0000 mg | CHEWABLE_TABLET | Freq: Four times a day (QID) | ORAL | Status: DC | PRN
  Administered 2022-01-06 – 2022-01-07 (×3): 80 mg via ORAL
  Filled 2022-01-06 (×3): qty 1

## 2022-01-06 MED ORDER — TRAMADOL HCL 50 MG PO TABS
50.0000 mg | ORAL_TABLET | Freq: Four times a day (QID) | ORAL | Status: DC | PRN
  Administered 2022-01-06 – 2022-01-07 (×2): 50 mg via ORAL
  Filled 2022-01-06 (×3): qty 1

## 2022-01-06 MED ORDER — POLYETHYLENE GLYCOL 3350 17 G PO PACK: 17.0000 g | PACK | Freq: Every day | ORAL | Status: DC | PRN

## 2022-01-06 MED ORDER — POTASSIUM CHLORIDE CRYS ER 20 MEQ PO TBCR
40.0000 meq | EXTENDED_RELEASE_TABLET | Freq: Once | ORAL | Status: AC
  Administered 2022-01-06: 40 meq via ORAL
  Filled 2022-01-06: qty 2

## 2022-01-06 MED ORDER — DOCUSATE SODIUM 100 MG PO CAPS
100.0000 mg | ORAL_CAPSULE | Freq: Every day | ORAL | Status: DC
  Administered 2022-01-06 – 2022-01-07 (×2): 100 mg via ORAL
  Filled 2022-01-06 (×2): qty 1

## 2022-01-06 NOTE — Progress Notes (Signed)
GYN Oncology Progress Note  Subjective: Patient notes some improvement overall but she reports increased abdominal discomfort in her upper abdomen. She reports that she is passing gas and had more of a formed stool this morning with resolution of her diarrhea.   Objective: Vital signs in last 24 hours: Temp:  [97.7 F (36.5 C)-99.7 F (37.6 C)] 97.7 F (36.5 C) (11/23 1238) Pulse Rate:  [104-115] 104 (11/23 1238) Resp:  [18-22] 22 (11/23 1238) BP: (107-124)/(69-85) 113/78 (11/23 1238) SpO2:  [97 %-98 %] 98 % (11/23 1238) Weight:  [124 lb 12.5 oz (56.6 kg)] 124 lb 12.5 oz (56.6 kg) (11/23 0500) Last BM Date : 01/05/22  Intake/Output from previous day: 11/22 0701 - 11/23 0700 In: 747.2 [I.V.:217.2; Blood:330; IV Piggyback:200] Out: -   Physical Examination: General: Alert, oriented, no acute distress. HEENT: Normocephalic, atraumatic.  Chest: Normal work of breathing.  Abdomen: Normoactive to hyperactive bowel sounds but no high pitched sounds. Abdomen soft. Moderately distended. Tympanitic in upper abdomen.  Extremities: Warm, well perfused.  No edema bilaterally.  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   Stool studies were not collected given that patient now has formed stool. However, it diarrhea were to recur, would collect given recent antibiotic use.  Neutropenia resolved. Now with elevated WBC to 30. Blood cultures NG x2 days. Will continue to follow-up. Antibiotics d/c'ed this morning by Dr. Alvy Bimler. Last fever was to 38.4 at [place] on 11/22. If continues to fever, will consider treatment for tumor fever  if cultures all negative.   Dr. Alvy Bimler added tramadol as needed for pain. Have added simethicone for gas pain.   Plan likely transfer to [facility] tomorrow given her complex needs for her rare tumor to consider continuation of chemotherapy inpatient versus surgery.     LOS: 3 days    [name] 01/06/2022, 1:38 PM

## 2022-01-06 NOTE — Progress Notes (Signed)
Progress Note    [name]   YHC:[id number]  DOB: 1970/10/13  DOA: 01/03/2022     3 PCP: Chesley Noon, MD  Initial CC: fevers  Hospital Course: Ms. 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 She had outpatient fevers and was started on Augmentin per oncology.  Due to ongoing fevers she presented for further workup and evaluation.   Upon evaluation in the emergency department, patient has been found to be neutropenic and leukopenic with multiple SIRS criteria including temp 101.7.  EDP discussed case with Dr. Alvy Bimler who recommended admission, infectious work-up including repeat CT imaging of the abdomen pelvis and initiation of antibiotics. Infectious workup remained negative and antibiotics were able to be discontinued.   Interval History:  Still some ongoing abdominal discomfort and possible gas pains. No further diarrhea and had a bowel movement this morning noted to be formed stool.  Stool studies were thus canceled. Antibiotics also discontinued per oncology today given negative infectious workup. Still having fevers, last one noted at midnight, 101.2. Tentative plan remains pursuing transfer to [facility].  Assessment and Plan:  Neutropenic fever -Presented with 1 week hx of fever with tmax 101.3, ANC of 300  - differential on admission included infection vs chemo induced fever vs malignancy associated - 11/20 blood cultures remain negative - UA negative for signs of infection - started on cefepime on admission - infectious workup has remained negative but fevers persist; abx have been discontinued per oncology - Continue trending fever curve - gyn-onc considering treating for tumor fever if infectious workup remains negative   Diarrhea - diarrhea likely abx induced;  stool studies were ordered but diarrhea has since resolved; if does develop recurrent diarrhea, will test stools at that time; okay to cancel stool studies for now   Anemia due to chemo and possibly from tumor - Hgb 6.4 g/dL on admission - s/p 1 unit PRBC 11/21 - continue trending Hgb   Dehydration -s/p IVF at time of presentation  Generalized weakness -1 week of generalized weakness in the absence of any acute focal weakness.   -Cont to encourage ambulation as tolerated   Generalized anxiety disorder -Continue ativan as needed -Stable at this time -continue zofran PRN nausea. Have added compazine for breakthrough nausea as well, per pt wishes   Hypokalemia - replete as needed    Old records reviewed in assessment of this patient  Antimicrobials: Cefepime 11/20 >> 11/23   DVT prophylaxis:  SCDs Start: 01/03/22 1922   Code Status:   Code Status: Full Code  Mobility Assessment (last 72 hours)     Mobility Assessment     Row Name 01/06/22 0739 01/05/22 1945 01/05/22 0848 01/04/22 2002 01/04/22 1146   Does patient have an order for bedrest or is patient medically unstable No - Continue assessment No - Continue assessment No - Continue assessment No - Continue assessment No - Continue assessment   What is the highest level of mobility based on the progressive mobility assessment? Level 6 (Walks independently in room and hall) - Balance while walking in room without assist - Complete Level 6 (Walks independently in room and hall) - Balance while walking in room without assist - Complete Level 6 (Walks independently in room and hall) - Balance while walking in room without assist - Complete Level  6 (Walks independently in room and hall) - Balance while walking in room without assist - Complete Level 6 (Walks independently in room and hall) - Balance while walking in room without assist - Complete    Row Name 01/03/22 1957           Does patient have an order for bedrest or is  patient medically unstable No - Continue assessment       What is the highest level of mobility based on the progressive mobility assessment? Level 6 (Walks independently in room and hall) - Balance while walking in room without assist - Complete                Barriers to discharge:  Disposition Plan:  Tx to [facility] Status is: Inpt  Objective: Blood pressure 113/78, pulse (!) 104, temperature 97.7 F (36.5 C), temperature source Axillary, resp. rate (!) 22, height '4\' 11"'$  (1.499 m), weight 56.6 kg, SpO2 98 %.  Examination:  Physical Exam Constitutional:      General: She is not in acute distress.    Appearance: Normal appearance.  HENT:     Head: Normocephalic and atraumatic.     Mouth/Throat:     Mouth: Mucous membranes are moist.  Eyes:     Extraocular Movements: Extraocular movements intact.  Cardiovascular:     Rate and Rhythm: Normal rate and regular rhythm.  Pulmonary:     Effort: Pulmonary effort is normal.     Breath sounds: Normal breath sounds.  Abdominal:     Comments: Distended, asymmetric from underlying tumor burden; nonspecific TTP throughout with no R/G; bowel sounds present (slightly more hyperpitched)  Musculoskeletal:        General: Swelling present. Normal range of motion.     Cervical back: Normal range of motion and neck supple.  Skin:    General: Skin is warm and dry.  Neurological:     General: No focal deficit present.     Mental Status: She is alert.  Psychiatric:        Mood and Affect: Mood normal.      Consultants:  Oncology Gyn-oncology   Procedures:    Data Reviewed: Results for orders placed or performed during the hospital encounter of 01/03/22 (from the past 24 hour(s))  CBC with Differential/Platelet     Status: Abnormal   Collection Time: 01/06/22  3:56 AM  Result Value Ref Range   WBC 30.5 (H) 4.0 - 10.5 K/uL   RBC 3.12 (L) 3.87 - 5.11 MIL/uL   Hemoglobin 8.4 (L) 12.0 - 15.0 g/dL   HCT 26.1 (L) 36.0 - 46.0 %   MCV 83.7  80.0 - 100.0 fL   MCH 26.9 26.0 - 34.0 pg   MCHC 32.2 30.0 - 36.0 g/dL   RDW 16.8 (H) 11.5 - 15.5 %   Platelets 185 150 - 400 K/uL   nRBC 0.4 (H) 0.0 - 0.2 %   Neutrophils Relative % 53 %   Neutro Abs 16.2 (H) 1.7 - 7.7 K/uL   Lymphocytes Relative 8 %   Lymphs Abs 2.5 0.7 - 4.0 K/uL   Monocytes Relative 10 %   Monocytes Absolute 3.0 (H) 0.1 - 1.0 K/uL   Eosinophils Relative 0 %   Eosinophils Absolute 0.0 0.0 - 0.5 K/uL   Basophils Relative 0 %   Basophils Absolute 0.1 0.0 - 0.1 K/uL   WBC Morphology      MODERATE LEFT SHIFT (>5% METAS AND MYELOS,OCC PRO NOTED)   Immature  Granulocytes 29 %   Abs Immature Granulocytes 8.87 (H) 0.00 - 0.07 K/uL  Comprehensive metabolic panel     Status: Abnormal   Collection Time: 01/06/22  3:56 AM  Result Value Ref Range   Sodium 134 (L) 135 - 145 mmol/L   Potassium 3.3 (L) 3.5 - 5.1 mmol/L   Chloride 105 98 - 111 mmol/L   CO2 23 22 - 32 mmol/L   Glucose, Bld 102 (H) 70 - 99 mg/dL   BUN 14 6 - 20 mg/dL   Creatinine, Ser 0.65 0.44 - 1.00 mg/dL   Calcium 7.4 (L) 8.9 - 10.3 mg/dL   Total Protein 5.1 (L) 6.5 - 8.1 g/dL   Albumin 1.8 (L) 3.5 - 5.0 g/dL   AST 24 15 - 41 U/L   ALT 19 0 - 44 U/L   Alkaline Phosphatase 146 (H) 38 - 126 U/L   Total Bilirubin 0.7 0.3 - 1.2 mg/dL   GFR, Estimated >60 >60 mL/min   Anion gap 6 5 - 15  Magnesium     Status: None   Collection Time: 01/06/22  3:56 AM  Result Value Ref Range   Magnesium 1.9 1.7 - 2.4 mg/dL  Hemoglobin and hematocrit, blood     Status: Abnormal   Collection Time: 01/06/22  3:56 AM  Result Value Ref Range   Hemoglobin 8.5 (L) 12.0 - 15.0 g/dL   HCT 25.9 (L) 36.0 - 46.0 %    I have Reviewed nursing notes, Vitals, and Lab results since pt's last encounter. Pertinent lab results : see above I have ordered test including BMP, CBC, Mg I have reviewed the last note from staff over past 24 hours I have discussed pt's care plan and test results with nursing staff, case manager  Time spent:  Greater than 50% of the 55 minute visit was spent in counseling/coordination of care for the patient as laid out in the A&P.    LOS: 3 days   Dwyane Dee, MD [facility] 01/06/2022, 2:23 PM

## 2022-01-06 NOTE — Progress Notes (Signed)
Pt reports that diarrhea has resolved. She had no more bowel movements yesterday and the BM last night was solid.

## 2022-01-06 NOTE — Progress Notes (Signed)
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 Berline Lopes and agree for her to be transferred to tertiary center at [facility] for further management I will cancel her outpatient follow-up next week Continue supportive care   Acquired pancytopenia She has received blood transfusion with improvement of her blood count The leukocytosis could be due to rebound from recent neutropenic fever Observe only  Neutropenic fever Neutropenia has resolved All cultures are negative I will cancel her antibiotics This is likely due to tumor fever  Antibiotic associated diarrhea I have discontinued antibiotics   Mild electrolyte imbalance Related to treatment and recent diarrhea Observe   Abdominal pain She has acetaminophen to take as needed.  I will prescribe tramadol as needed if pain is moderate to severe  Discharge planning GYN oncology team is arranging for transfer to [facility] I will sign off.  Please call if questions arise  All questions were answered. The patient knows to call the clinic with any problems, questions or concerns.   The total time spent in the appointment was 25 minutes encounter with patients including review of chart and various tests results, discussions about plan of care and coordination of care plan  Heath Lark, MD 01/06/2022 8:05 AM  Subjective:  She did not sleep much last night due to machine beeping by her bedside.  She had recurrent fever again last night.  Her diarrhea has resolved.  She has mild intermittent abdominal pain  Objective:  Vitals:   01/05/22 2035 01/06/22 0407  BP: 120/79 119/78  Pulse: (!) 110 (!) 106  Resp: 18 18  Temp: 99.7 F (37.6 C) 99.5 F (37.5 C)  SpO2: 98% 98%      Intake/Output Summary (Last 24 hours) at 01/06/2022 0805 Last data filed at 01/06/2022 0155 Gross per 24 hour  Intake 747.17 ml  Output --  Net 747.17 ml    GENERAL:alert, no distress and comfortable NEURO: alert & oriented x 3 with fluent speech, no focal motor/sensory deficits   Labs:  Recent Labs    01/04/22 0457 01/05/22 0319 01/06/22 0356  NA 136 137 134*  K 3.1* 3.9 3.3*  CL 104 106 105  CO2 21* 23 23  GLUCOSE 79 87 102*  BUN '11 13 14  '$ CREATININE 0.49 0.54 0.65  CALCIUM 7.8* 7.7* 7.4*  GFRNONAA >60 >60 >60  PROT 5.5* 5.5* 5.1*  ALBUMIN 2.1* 2.0* 1.8*  AST '16 21 24  '$ ALT '14 14 19  '$ ALKPHOS 126 136* 146*  BILITOT 0.7 0.9 0.7    Studies:  CT ABDOMEN PELVIS W CONTRAST  Result Date: 01/03/2022 CLINICAL DATA:  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 Diffuse abdominal swelling. Fever for 6 days. * Tracking Code: BO * EXAM: CT ABDOMEN AND PELVIS WITH CONTRAST TECHNIQUE: Multidetector CT imaging of the abdomen and pelvis was performed using the standard protocol following bolus administration of intravenous contrast. RADIATION DOSE REDUCTION: This exam was performed according to the departmental dose-optimization program which includes automated exposure control, adjustment of the mA and/or kV according to patient size and/or use of iterative reconstruction technique. CONTRAST:  19m OMNIPAQUE IOHEXOL 350 MG/ML SOLN COMPARISON:  11/17/2021 FINDINGS: Lower chest: Unremarkable Hepatobiliary: Heterogeneous  likely subcapsular mass in the dome of the liver primarily of the right hepatic lobe with speckled internal calcifications, measuring 10.8 by 5.3 by 8.7 cm (volume = 260 cm^3), previously 9.6 by 5.2 by 8.8 cm (volume = 230 cm^3) on 12/07/2021. Lobularity along the margin with the hepatic parenchyma suggests hepatic invasion. Contracted gallbladder. Perihepatic ascites noted. No new hepatic lesion observed. No biliary dilatation.  Pancreas: Unremarkable Spleen: Unremarkable Adrenals/Urinary Tract: Suspected 2 mm left kidney lower pole nonobstructive renal calculus, image 33 series 2. Adrenal glands unremarkable. No hydronephrosis. Urinary bladder unremarkable. Stomach/Bowel: Displacement of bowel loops by the large central abdominopelvic tumor. No current dilated bowel. Vascular/Lymphatic: Mild abdominal aortic atherosclerotic vascular disease. Reproductive: Tracking above and around the uterus and filling a substantial portion of the abdominopelvic cavity, we demonstrate a 24.8 by 15.1 by 17.6 cm (volume = 3450 cm^3) heterogeneous mass with scattered calcifications compatible with malignancy. Prior CT abdomen did not include the pelvis and accordingly size wise this is difficult to compare. However, the abdominal component is similar to previous. This may be arising from the ovaries with slight eccentricity to the right in the pelvis. Other: Scattered malignant ascites. A cystic and solid right omental deposit measures 9.3 by 4.3 cm on image 47 series 2, formerly 8.4 by 3.7 cm. There stranding and some mild nodularity along the rest of the omentum. Perihepatic and perisplenic ascites noted along with ascites tracking in the paracolic gutter and in the pelvis. Musculoskeletal: Unremarkable IMPRESSION: 1. Large central abdominopelvic mass compatible with malignancy, 3450 cubic cm in volume. This may be arising from the ovaries with slight eccentricity to the right in the pelvis. 2. Mild increase in size of the right omental tumor deposit. 3. Mild increase in size of the subcapsular mass along the dome of the right hepatic lobe, with suspected hepatic invasion. 4. Scattered malignant ascites. 5. No dilated bowel to suggest obstruction. 6. 2 mm left kidney lower pole nonobstructive renal calculus. 7. Mild abdominal aortic atherosclerotic vascular disease. Aortic Atherosclerosis (ICD10-I70.0). Electronically Signed   By: Van Clines M.D.    On: 01/03/2022 13:52   DG Chest 1 View  Result Date: 01/03/2022 CLINICAL DATA:  Fever of unknown origin. EXAM: CHEST  1 VIEW COMPARISON:  12/30/2021 FINDINGS: There is a right chest wall port a catheter with tip in the projection of the SVC. Normal heart size. No pleural effusion or edema. No airspace opacities identified. Visualized osseous structures are unremarkable. IMPRESSION: No active disease. Electronically Signed   By: Kerby Moors M.D.   On: 01/03/2022 11:32   DG Chest 2 View  Result Date: 12/30/2021 CLINICAL DATA:  Fever EXAM: CHEST - 2 VIEW COMPARISON:  CT chest done on 12/07/2021 FINDINGS: Cardiac size is within normal limits. There are no signs of pulmonary edema or focal pulmonary consolidation. There is no pleural effusion or pneumothorax. Tip of right IJ chest port is seen in superior vena cava. IMPRESSION: No active cardiopulmonary disease. Electronically Signed   By: Elmer Picker M.D.   On: 12/30/2021 13:54   ECHOCARDIOGRAM COMPLETE  Result Date: 12/29/2021    ECHOCARDIOGRAM REPORT   Patient Name:   ANALEIA ISMAEL Date of Exam: 12/29/2021 Medical Rec #:  [id number]      Height:       59.0 in Accession #:    [id number]     Weight:       122.0 lb Date of Birth:  1970-09-16      BSA:  1.495 m Patient Age:    51 years       BP:           100/60 mmHg Patient Gender: F              HR:           107 bpm. Exam Location:  Outpatient Procedure: 2D Echo, 3D Echo, Color Doppler, Cardiac Doppler and Strain Analysis Indications:    R00.0 Tachycardia  History:        Patient has no prior history of Echocardiogram examinations.                 Risk Factors:Non-Smoker. Malignant germ cell tumor of ovary;                 possibility of impingement of the vena cava by her abdominal                 mass.  Sonographer:    Leavy Cella RDCS Referring Phys: [id number] [place]  1. Left ventricular ejection fraction, by estimation, is 55 to 60%. The left ventricle has  normal function. The left ventricle has no regional wall motion abnormalities. Indeterminate diastolic filling due to E-A fusion.  2. Right ventricular systolic function is normal. The right ventricular size is normal. Tricuspid regurgitation signal is inadequate for assessing PA pressure.  3. The mitral valve is grossly normal. No evidence of mitral valve regurgitation. No evidence of mitral stenosis.  4. The aortic valve is tricuspid. Aortic valve regurgitation is not visualized. No aortic stenosis is present.  5. The inferior vena cava is normal in size with greater than 50% respiratory variability, suggesting right atrial pressure of 3 mmHg. Conclusion(s)/Recommendation(s): Normal biventricular function without evidence of hemodynamically significant valvular heart disease. FINDINGS  Left Ventricle: Left ventricular ejection fraction, by estimation, is 55 to 60%. The left ventricle has normal function. The left ventricle has no regional wall motion abnormalities. Global longitudinal strain performed but not reported based on interpreter judgement due to suboptimal tracking. 3D left ventricular ejection fraction analysis performed but not reported based on interpreter judgement due to suboptimal tracking. The left ventricular internal cavity size was normal in size. There is no left ventricular hypertrophy. Indeterminate diastolic filling due to E-A fusion. Right Ventricle: The right ventricular size is normal. No increase in right ventricular wall thickness. Right ventricular systolic function is normal. Tricuspid regurgitation signal is inadequate for assessing PA pressure. Left Atrium: Left atrial size was normal in size. Right Atrium: Right atrial size was normal in size. Pericardium: There is no evidence of pericardial effusion. Mitral Valve: The mitral valve is grossly normal. No evidence of mitral valve regurgitation. No evidence of mitral valve stenosis. Tricuspid Valve: The tricuspid valve is grossly  normal. Tricuspid valve regurgitation is not demonstrated. No evidence of tricuspid stenosis. Aortic Valve: The aortic valve is tricuspid. Aortic valve regurgitation is not visualized. No aortic stenosis is present. Pulmonic Valve: The pulmonic valve was grossly normal. Pulmonic valve regurgitation is not visualized. No evidence of pulmonic stenosis. Aorta: The aortic root and ascending aorta are structurally normal, with no evidence of dilitation. Venous: The inferior vena cava is normal in size with greater than 50% respiratory variability, suggesting right atrial pressure of 3 mmHg. IAS/Shunts: The atrial septum is grossly normal.  LEFT VENTRICLE PLAX 2D LVIDd:         3.95 cm   Diastology LVIDs:         2.14 cm  LV e' medial:    11.20 cm/s LV PW:         0.92 cm   LV E/e' medial:  6.1 LV IVS:        0.74 cm   LV e' lateral:   13.80 cm/s LVOT diam:     2.00 cm   LV E/e' lateral: 5.0 LV SV:         41 LV SV Index:   28 LVOT Area:     3.14 cm                           3D Volume EF:                          3D EF:        50 %                          LV EDV:       191 ml                          LV ESV:       96 ml                          LV SV:        95 ml RIGHT VENTRICLE RV Basal diam:  2.72 cm RV Mid diam:    2.27 cm RV S prime:     18.60 cm/s TAPSE (M-mode): 1.9 cm LEFT ATRIUM           Index        RIGHT ATRIUM          Index LA diam:      2.80 cm 1.87 cm/m   RA Area:     8.16 cm LA Vol (A2C): 8.1 ml  5.44 ml/m   RA Volume:   15.40 ml 10.30 ml/m LA Vol (A4C): 24.4 ml 16.32 ml/m  AORTIC VALVE LVOT Vmax:   95.00 cm/s LVOT Vmean:  59.800 cm/s LVOT VTI:    0.132 m  AORTA Ao Root diam: 2.40 cm Ao Asc diam:  2.40 cm MITRAL VALVE MV Area (PHT): 4.06 cm    SHUNTS MV Decel Time: 187 msec    Systemic VTI:  0.13 m MV E velocity: 68.60 cm/s  Systemic Diam: 2.00 cm MV A velocity: 63.30 cm/s MV E/A ratio:  1.08 Eleonore Chiquito MD Electronically signed by Eleonore Chiquito MD Signature Date/Time: 12/29/2021/4:27:03 PM     Final    IR US Guide Bx Asp/Drain  Result Date: 12/08/2021 INDICATION: 51 year old woman with history of pelvic malignancy presents to IR for biopsy of right upper quadrant satellite lesion. EXAM: Ultrasound-guided biopsy of right upper quadrant satellite lesion MEDICATIONS: None. ANESTHESIA/SEDATION: Moderate (conscious) sedation was employed during this procedure. A total of Versed 1 mg and Fentanyl 50 mcg was administered intravenously. Moderate Sedation Time: 10 minutes. The patient's level of consciousness and vital signs were monitored continuously by radiology nursing throughout the procedure under my direct supervision. FLUOROSCOPY TIME:  None COMPLICATIONS: None immediate. PROCEDURE: Informed written consent was obtained from the patient after a thorough discussion of the procedural risks, benefits and alternatives. All questions were addressed. Maximal Sterile Barrier Technique was utilized including caps, mask, sterile gowns, sterile gloves, sterile drape, hand hygiene and skin  antiseptic. A timeout was performed prior to the initiation of the procedure. Patient position supine on the ultrasound table. Right upper quadrant skin prepped and draped in usual sterile fashion. Following local lidocaine administration, 17 gauge introducer needle was advanced into the right upper quadrant mass, and 4- 18 gauge cores were obtained utilizing continuous ultrasound guidance. Samples were sent to pathology in formalin. Needle removed and hemostasis achieved with 5 minutes of manual compression. Post procedure ultrasound images showed no evidence of significant hemorrhage. IMPRESSION: Ultrasound-guided biopsy of right upper quadrant mass. Electronically Signed   By: Miachel Roux M.D.   On: 12/08/2021 12:37   IR IMAGING GUIDED PORT INSERTION  Result Date: 12/08/2021 INDICATION: Metastatic pelvic malignancy EXAM: IMPLANTED PORT A CATH PLACEMENT WITH ULTRASOUND AND FLUOROSCOPIC GUIDANCE MEDICATIONS: None  ANESTHESIA/SEDATION: Moderate (conscious) sedation was employed during this procedure. A total of Versed 2 mg and Fentanyl 150 mcg was administered intravenously by the radiology nurse. Total intra-service moderate Sedation Time: 21 minutes. The patient's level of consciousness and vital signs were monitored continuously by radiology nursing throughout the procedure under my direct supervision. FLUOROSCOPY: Radiation Exposure Index (as provided by the fluoroscopic device): 7 mGy Kerma COMPLICATIONS: None immediate. PROCEDURE: The procedure, risks, benefits, and alternatives were explained to the patient. Questions regarding the procedure were encouraged and answered. The patient understands and consents to the procedure. A timeout was performed prior to the initiation of the procedure. Patient positioned supine on the angiography table. Right neck and anterior upper chest prepped and draped in the usual sterile fashion. All elements of maximal sterile barrier were utilized including, cap, mask, sterile gown, sterile gloves, large sterile drape, hand scrubbing and 2% Chlorhexidine for skin cleaning. The right internal jugular vein was evaluated with ultrasound and shown to be patent. A permanent ultrasound image was obtained and placed in the patient's medical record. Local anesthesia was provided with 1% lidocaine with epinephrine. Using sterile gel and a sterile probe cover, the right internal jugular vein was entered with a 21 ga needle during real time ultrasound guidance. 0.018 inch guidewire placed and 21 ga needle exchanged for transitional dilator set. Utilizing fluoroscopy, 0.035 inch guidewire advanced centrally without difficulty. Attention then turned to the right anterior upper chest. Following local lidocaine administration, a port pocket was created. The catheter was connected to the port and brought from the pocket to the venotomy site through a subcutaneous tunnel. The catheter was cut to size and  inserted through the peel-away sheath. The catheter tip was positioned at the cavoatrial junction using fluoroscopic guidance. The port aspirated and flushed well. The port pocket was closed with deep and superficial absorbable suture. The port pocket incision and venotomy sites were also sealed with Dermabond. IMPRESSION: Successful placement of a right internal jugular approach power injectable Port-A-Cath. The catheter is ready for immediate use. Electronically Signed   By: Miachel Roux M.D.   On: 12/08/2021 12:36   VAS [place] LOWER EXTREMITY VENOUS (DVT)  Result Date: 12/07/2021  Lower Venous DVT Study Patient Name:  ANGLA DELAHUNT  Date of Exam:   12/07/2021 Medical Rec #: [id number]       Accession #:    [id number] Date of Birth: 1970/02/22       Patient Gender: F Patient Age:   24 years Exam Location:  [facility] Procedure:      VAS [place] LOWER EXTREMITY VENOUS (DVT) Referring Phys: Jeral Pinch --------------------------------------------------------------------------------  Indications: Abdominopelvic mass with compression of IVC.  Comparison Study: No  prior studies. Performing Technologist: Darlin Coco RDMS, RVT  Examination Guidelines: A complete evaluation includes B-mode imaging, spectral Doppler, color Doppler, and power Doppler as needed of all accessible portions of each vessel. Bilateral testing is considered an integral part of a complete examination. Limited examinations for reoccurring indications may be performed as noted. The reflux portion of the exam is performed with the patient in reverse Trendelenburg.  +---------+---------------+---------+-----------+----------+--------------+ RIGHT    CompressibilityPhasicitySpontaneityPropertiesThrombus Aging +---------+---------------+---------+-----------+----------+--------------+ CFV      Full           Yes      Yes                                 +---------+---------------+---------+-----------+----------+--------------+  SFJ      Full                                                        +---------+---------------+---------+-----------+----------+--------------+ FV Prox  Full                                                        +---------+---------------+---------+-----------+----------+--------------+ FV Mid   Full                                                        +---------+---------------+---------+-----------+----------+--------------+ FV DistalFull                                                        +---------+---------------+---------+-----------+----------+--------------+ PFV      Full                                                        +---------+---------------+---------+-----------+----------+--------------+ POP      Full           Yes      Yes                                 +---------+---------------+---------+-----------+----------+--------------+ PTV      Full                                                        +---------+---------------+---------+-----------+----------+--------------+ PERO     Full                                                        +---------+---------------+---------+-----------+----------+--------------+  Gastroc  Full                                                        +---------+---------------+---------+-----------+----------+--------------+   +---------+---------------+---------+-----------+----------+--------------+ LEFT     CompressibilityPhasicitySpontaneityPropertiesThrombus Aging +---------+---------------+---------+-----------+----------+--------------+ CFV      Full           Yes      Yes                                 +---------+---------------+---------+-----------+----------+--------------+ SFJ      Full                                                        +---------+---------------+---------+-----------+----------+--------------+ FV Prox  Full                                                         +---------+---------------+---------+-----------+----------+--------------+ FV Mid   Full                                                        +---------+---------------+---------+-----------+----------+--------------+ FV DistalFull                                                        +---------+---------------+---------+-----------+----------+--------------+ PFV      Full                                                        +---------+---------------+---------+-----------+----------+--------------+ POP      Full           Yes      Yes                                 +---------+---------------+---------+-----------+----------+--------------+ PTV      Full                                                        +---------+---------------+---------+-----------+----------+--------------+ PERO     Full                                                        +---------+---------------+---------+-----------+----------+--------------+  Gastroc  Full                                                        +---------+---------------+---------+-----------+----------+--------------+     Summary: RIGHT: - There is no evidence of deep vein thrombosis in the lower extremity.  - No cystic structure found in the popliteal fossa.  LEFT: - There is no evidence of deep vein thrombosis in the lower extremity.  - No cystic structure found in the popliteal fossa.  *See table(s) above for measurements and observations. Electronically signed by Deitra Mayo MD on 12/07/2021 at 12:56:56 PM.    Final    CT ABDOMEN W CONTRAST  Result Date: 12/07/2021 CLINICAL DATA:  Pelvic mass. Malignant ascites. Metastatic disease evaluation. * Tracking Code: BO * EXAM: CT ABDOMEN WITH CONTRAST TECHNIQUE: Multidetector CT imaging of the abdomen was performed using the standard protocol following bolus administration of intravenous contrast. RADIATION DOSE REDUCTION:  This exam was performed according to the departmental dose-optimization program which includes automated exposure control, adjustment of the mA and/or kV according to patient size and/or use of iterative reconstruction technique. CONTRAST:  11m OMNIPAQUE IOHEXOL 350 MG/ML SOLN COMPARISON:  Pelvic MRI 12/02/2021.  Abdominopelvic CT 09/29/2005. FINDINGS: Lower chest:  Chest findings dictated separately. Hepatobiliary: There is a large heterogeneous, partially calcified mass along the superior aspect of the right hepatic lobe which is likely subcapsular in origin based on the reformatted images. This measures approximately 10.3 x 8.0 x 5.1 cm and shows heterogeneous enhancement following contrast. There may be invasion of the liver, especially by a 1.8 cm component on coronal image 45/5. No other separate hepatic lesions. No evidence of gallstones, gallbladder wall thickening or biliary dilatation. Pancreas: Unremarkable. No pancreatic ductal dilatation or surrounding inflammatory changes. Spleen: Normal in size without focal abnormality. Adrenals/Urinary Tract: Both adrenal glands appear normal. No evidence of urinary tract calculus, suspicious renal lesion or hydronephrosis Bladder not imaged. Stomach/Bowel: No enteric contrast administered. The stomach appears unremarkable for its degree of distention. The visualized bowel demonstrates no wall thickening, distention or surrounding inflammation. The appendix appears normal. Vascular/Lymphatic: There are no enlarged abdominal lymph nodes. Aortic and branch vessel atherosclerosis without evidence of large vessel occlusion or aneurysm. The portal, superior mesenteric and splenic veins appear patent. Other: Small amount of abdominal ascites. As above, there is a large subcapsular metastasis involving the superior aspect of the liver. There is a right omental implant measuring 6.9 x 3.5 cm on image 28/2. There is a very large central mass involving the upper abdomen and  pelvis, incompletely visualized by this CT of the abdomen, but measuring up to 16.5 x 12.2 cm on image 37/2. This was seen on recent pelvic MRI. Musculoskeletal: No acute or significant osseous findings. IMPRESSION: 1. Large central abdominal and pelvic mass consistent with known malignancy. This is incompletely visualized by this CT of the abdomen, but was seen on recent pelvic MRI. 2. 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 Right omental implant consistent with metastatic disease. Small amount of abdominal ascites. 3. No evidence for bowel or ureteral obstruction. 4. Aortic Atherosclerosis (ICD10-I70.0). 5. Chest findings dictated separately. Electronically Signed   By: WRichardean SaleM.D.   On: 12/07/2021 11:04   CT Angio Chest Pulmonary Embolism (PE)  W or WO Contrast  Result Date: 12/07/2021 CLINICAL DATA:  51 year old female with ovarian cancer.  Cough. EXAM: CT ANGIOGRAPHY CHEST WITH CONTRAST TECHNIQUE: Multidetector CT imaging of the chest was performed using the standard protocol during bolus administration of intravenous contrast. Multiplanar CT image reconstructions and MIPs were obtained to evaluate the vascular anatomy. RADIATION DOSE REDUCTION: This exam was performed according to the departmental dose-optimization program which includes automated exposure control, adjustment of the mA and/or kV according to patient size and/or use of iterative reconstruction technique. CONTRAST:  44m OMNIPAQUE IOHEXOL 350 MG/ML SOLN COMPARISON:  CT Abdomen today reported separately. FINDINGS: Cardiovascular: Excellent contrast bolus timing in the pulmonary arterial tree. Mild respiratory motion. No pulmonary artery filling defect. No calcified coronary artery atherosclerosis is evident. Negative visible aorta. No cardiomegaly or pericardial effusion. Mediastinum/Nodes: No mediastinal mass or lymphadenopathy. Lungs/Pleura: Major  airways are patent. Both lungs are clear except for minor dependent atelectasis. No pulmonary nodule, mass, pleural effusion, consolidation. Upper Abdomen: Reported separately today, liver mass and ascites are visible. Musculoskeletal: No acute or suspicious osseous lesion in the chest. Review of the MIP images confirms the above findings. IMPRESSION: 1. Negative for pulmonary embolism. And no acute or metastatic process identified in the Chest. 2. Partially visible liver mass and ascites, staging CT Abdomen today is reported separately. Electronically Signed   By: HGenevie AnnM.D.   On: 12/07/2021 10:54

## 2022-01-07 LAB — COMPREHENSIVE METABOLIC PANEL
ALT: 32 U/L (ref 0–44)
AST: 48 U/L — ABNORMAL HIGH (ref 15–41)
Albumin: 1.8 g/dL — ABNORMAL LOW (ref 3.5–5.0)
Alkaline Phosphatase: 191 U/L — ABNORMAL HIGH (ref 38–126)
Anion gap: 5 (ref 5–15)
BUN: 11 mg/dL (ref 6–20)
CO2: 25 mmol/L (ref 22–32)
Calcium: 7.6 mg/dL — ABNORMAL LOW (ref 8.9–10.3)
Chloride: 105 mmol/L (ref 98–111)
Creatinine, Ser: 0.47 mg/dL (ref 0.44–1.00)
GFR, Estimated: >60 mL/min (ref >60)
Glucose, Bld: 117 mg/dL — ABNORMAL HIGH (ref 70–99)
Potassium: 3.5 mmol/L (ref 3.5–5.1)
Sodium: 135 mmol/L (ref 135–145)
Total Bilirubin: 0.5 mg/dL (ref 0.3–1.2)
Total Protein: 5.4 g/dL — ABNORMAL LOW (ref 6.5–8.1)

## 2022-01-07 LAB — CBC WITH DIFFERENTIAL/PLATELET
Abs Immature Granulocytes: 0.5 10*3/uL — ABNORMAL HIGH (ref 0.00–0.07)
Band Neutrophils: 8 %
Basophils Absolute: 0 10*3/uL (ref 0.0–0.1)
Basophils Relative: 0 %
Eosinophils Absolute: 0 10*3/uL (ref 0.0–0.5)
Eosinophils Relative: 0 %
HCT: 26.5 % — ABNORMAL LOW (ref 36.0–46.0)
Hemoglobin: 8.7 g/dL — ABNORMAL LOW (ref 12.0–15.0)
Lymphocytes Relative: 16 %
Lymphs Abs: 7.3 10*3/uL — ABNORMAL HIGH (ref 0.7–4.0)
MCH: 27.8 pg (ref 26.0–34.0)
MCHC: 32.8 g/dL (ref 30.0–36.0)
MCV: 84.7 fL (ref 80.0–100.0)
Metamyelocytes Relative: 1 %
Monocytes Absolute: 3.7 10*3/uL — ABNORMAL HIGH (ref 0.1–1.0)
Monocytes Relative: 8 %
Neutro Abs: 34.3 10*3/uL — ABNORMAL HIGH (ref 1.7–7.7)
Neutrophils Relative %: 67 %
Platelets: 202 10*3/uL (ref 150–400)
RBC: 3.13 MIL/uL — ABNORMAL LOW (ref 3.87–5.11)
RDW: 16.4 % — ABNORMAL HIGH (ref 11.5–15.5)
WBC: 45.7 10*3/uL — ABNORMAL HIGH (ref 4.0–10.5)
nRBC: 0.3 % — ABNORMAL HIGH (ref 0.0–0.2)

## 2022-01-07 LAB — MAGNESIUM: Magnesium: 1.9 mg/dL (ref 1.7–2.4)

## 2022-01-07 MED ORDER — IBUPROFEN 200 MG PO TABS
600.0000 mg | ORAL_TABLET | Freq: Four times a day (QID) | ORAL | Status: DC
  Filled 2022-01-07: qty 3

## 2022-01-07 MED ORDER — IBUPROFEN 400 MG PO TABS: 400.0000 mg | ORAL_TABLET | Freq: Four times a day (QID) | ORAL | 0 refills | Status: DC

## 2022-01-07 MED ORDER — IBUPROFEN 200 MG PO TABS
400.0000 mg | ORAL_TABLET | Freq: Four times a day (QID) | ORAL | Status: DC
  Administered 2022-01-07 – 2022-01-08 (×3): 400 mg via ORAL
  Filled 2022-01-07 (×3): qty 2

## 2022-01-07 NOTE — Progress Notes (Signed)
GYN Oncology Progress Note  Subjective: Patient reports fever last night caused significant sweating. She reports continued solid stools, more difficult to pass most recently so colace added. She denies dysuria, SOB or cough. No new extremity swelling. Reports urinary frequency but this has been stable for some time and reports this was the preceding symptom to her initial cancer diagnosis (bulk effect).   Objective: Vital signs in last 24 hours: Temp:  [97.5 F (36.4 C)-100.5 F (38.1 C)] 98.3 F (36.8 C) (11/24 0407) Pulse Rate:  [101-123] 101 (11/24 0407) Resp:  [18-24] 18 (11/24 0407) BP: (113-131)/(78-82) 128/79 (11/24 0407) SpO2:  [96 %-98 %] 96 % (11/24 0407) Weight:  [122 lb 2.2 oz (55.4 kg)] 122 lb 2.2 oz (55.4 kg) (11/24 0411) Last BM Date : 01/05/22  Intake/Output from previous day: 11/23 0701 - 11/24 0700 In: 480.8 [P.O.:360; I.V.:120.8] Out: -   Physical Examination: General: Alert, oriented, no acute distress. HEENT: Normocephalic, atraumatic.  Chest: Normal work of breathing. Lungs clear to auscultation anteriorly Cardiovascular: Sinus tachycardia. Abdomen: Normoactive bowel sounds. Abdomen soft. Moderately distended. Tympanitic in upper abdomen.  Extremities: Warm, well perfused.  No edema bilaterally. 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   Neutropenia resolved. Now with elevated WBC to 45.7. Blood cultures NG x3 days. Will continue to follow-up. Antibiotics d/c'ed yesterday by Dr. Alvy Bimler. Last fever was to 38.1 at 2047 on 11/23. Will plan to start ibuprofen for  tumor fever.  Continue with tramadol for pain as needed, simethicone prn for gas pain.  Will transfer to [facility] today given her complex needs for her rare tumor to consider continuation of chemotherapy inpatient versus surgery.     LOS: 4 days    Khylie Larmore 01/07/2022, 7:15 AM

## 2022-01-07 NOTE — Discharge Summary (Signed)
Physician Discharge Summary   [name] Genna MWU:[id number] DOB: [birth year] DOA: 01/03/2022  PCP: Chesley Noon, MD  Admit date: 01/03/2022 Discharge date: 01/07/2022 Barriers to discharge: awaiting transfer to [facility]  Admitted From: Home Disposition:  Transfer to [facility] Discharging physician: Dwyane Dee, MD  Discharge Condition: stable CODE STATUS: Full Diet recommendation:  Diet Orders (From admission, onward)     Start     Ordered   01/07/22 0000  Diet general        01/07/22 1243   01/03/22 1922  Diet regular Room service appropriate? Yes; Fluid consistency: Thin  Diet effective now       Question Answer Comment  Room service appropriate? 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 [name] had outpatient fevers and was started on Augmentin per oncology.  Due to ongoing fevers [name] presented for further workup and evaluation.   Upon evaluation in the emergency department, patient has been found to be neutropenic and leukopenic with multiple SIRS criteria including temp 101.7.  EDP discussed case with Dr. Alvy Bimler who recommended admission, infectious work-up including repeat CT imaging of the abdomen pelvis and initiation of antibiotics. Infectious workup remained negative and antibiotics were able to be discontinued.  Assessment and Plan:  Neutropenic fever -Presented with 1 week hx of fever with tmax 101.3, ANC of 300  - differential on admission included infection vs chemo induced fever vs malignancy associated - 11/20 blood cultures remain negative - UA negative for signs of infection - started on cefepime on admission - infectious workup has remained negative but fevers persist; abx have  been discontinued per oncology - neutropenia resolved however fevers persist - ibuprofen added 11/24 per gyn-onc for tumor fever - Continue trending fever curve   Diarrhea - resolved - diarrhea likely abx induced; stool studies were ordered but diarrhea has since resolved; if does develop recurrent diarrhea, will test stools at that time; okay to cancel stool studies for now - patient now requiring colace   Anemia due to chemo and possibly from tumor - Hgb 6.4 g/dL on admission - s/p 1 unit PRBC 11/21 - continue trending Hgb   Dehydration - resolved  -s/p IVF at time of presentation   Generalized weakness -1 week of generalized weakness in the absence of any acute focal weakness.   -Cont to encourage ambulation as tolerated   Generalized anxiety disorder -Continue ativan as needed -Stable at this time -continue zofran PRN nausea. 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     Discharge Instructions     Diet general   Complete by: As directed  Increase activity slowly   Complete by: As directed       Allergies as of 01/07/2022       Reactions   Latex Other (See Comments)   Sensitivity only   Sulfa Antibiotics Hives, Other (See Comments)   Sulfamethoxazole-trimethoprim Rash   Septra [bactrim] Rash        Medication List     STOP taking these medications    amoxicillin-clavulanate 875-125 MG  tablet Commonly known as: AUGMENTIN   Eliquis 2.5 MG Tabs tablet Generic drug: apixaban   furosemide 20 MG tablet Commonly known as: LASIX       TAKE these medications    acetaminophen 500 MG tablet Commonly known as: TYLENOL Take 1,000 mg by mouth every 6 (six) hours as needed for moderate pain.   diphenhydrAMINE HCl 12.5 MG Tbdp Take 12.5 mg by mouth at bedtime as needed for sleep.   famotidine 10 MG tablet Commonly known as: PEPCID Take 10 mg by mouth daily as needed for heartburn or indigestion.   ibuprofen 400 MG tablet Commonly known as: ADVIL Take 1 tablet (400 mg total) by mouth every 6 (six) hours.   LORazepam 0.5 MG tablet Commonly known as: ATIVAN Take 1 tablet (0.5 mg total) by mouth every 8 (eight) hours. What changed:  when to take this reasons to take this        Allergies  Allergen Reactions   Latex Other (See Comments)    Sensitivity only   Sulfa Antibiotics Hives and Other (See Comments)   Sulfamethoxazole-Trimethoprim Rash   Septra [Bactrim] Rash    Consultations: Gyn oncology Oncology  Procedures:   Discharge Exam: BP 121/81   Pulse (!) 105   Temp 98.4 F (36.9 C) (Oral)   Resp 18   Ht '4\' 11"'$  (1.499 m)   Wt 55.4 kg   LMP  (LMP Unknown) Comment: tubal ligation  SpO2 100%   BMI 24.67 kg/m  Physical Exam Constitutional:      General: [name] is not in acute distress.    Appearance: Normal appearance.  HENT:     Head: Normocephalic and atraumatic.     Mouth/Throat:     Mouth: Mucous membranes are moist.  Eyes:     Extraocular Movements: Extraocular movements intact.  Cardiovascular:     Rate and Rhythm: Normal rate and regular rhythm.  Pulmonary:     Effort: Pulmonary effort is normal.     Breath sounds: Normal breath sounds.  Abdominal:     Comments: Distended, asymmetric from underlying tumor burden; nonspecific TTP throughout with no R/G; bowel sounds present (slightly more hyperpitched)  Musculoskeletal:         General: Swelling present. Normal range of motion.     Cervical back: Normal range of motion and neck supple.  Skin:    General: Skin is warm and dry.  Neurological:     General: No focal deficit present.     Mental Status: [name] is alert.  Psychiatric:        Mood and Affect: Mood normal.      The results of significant diagnostics from this hospitalization (including imaging, microbiology, ancillary and laboratory) are listed below for reference.   Microbiology: Recent Results (from the past 240 hour(s))  Urine Culture     Status: Abnormal   Collection Time: 12/30/21  2:28 PM   Specimen: Urine, Clean Catch  Result Value Ref Range Status   Specimen Description   Final    URINE, CLEAN CATCH Performed  at [facility], [place] [address], [place], [place] [address]    Special Requests   Final    NONE Performed at [facility], [place] [address], [place], [place] [address]    Culture MULTIPLE SPECIES PRESENT, SUGGEST RECOLLECTION (A)  Final   Report Status 12/31/2021 FINAL  Final  Culture, blood (single) w Reflex to ID Panel     Status: None   Collection Time: 12/30/21  2:43 PM   Specimen: BLOOD  Result Value Ref Range Status   Specimen Description BLOOD RIGHT ANTECUBITAL  Final   Special Requests   Final    BOTTLES DRAWN AEROBIC AND ANAEROBIC Blood Culture results may not be optimal due to an excessive volume of blood received in culture bottles   Culture   Final    NO GROWTH 5 DAYS Performed at [facility], [place] [address], [place], [place] [address]    Report Status 01/04/2022 FINAL  Final  Blood culture (routine x 2)     Status: None (Preliminary result)   Collection Time: 01/03/22 10:47 AM   Specimen: BLOOD  Result Value Ref Range Status   Specimen Description BLOOD LEFT ANTECUBITAL  Final   Special Requests   Final    BOTTLES DRAWN AEROBIC AND ANAEROBIC Blood Culture adequate volume   Culture   Final    NO GROWTH 4  DAYS Performed at [facility], [place] [address], [place], [place] [address]    Report Status PENDING  Incomplete  Blood culture (routine x 2)     Status: None (Preliminary result)   Collection Time: 01/03/22 12:13 PM   Specimen: BLOOD  Result Value Ref Range Status   Specimen Description   Final    BLOOD BLOOD RIGHT FOREARM Performed at [facility], [address] [place], [place] [address]    Special Requests   Final    Blood Culture adequate volume BOTTLES DRAWN AEROBIC AND ANAEROBIC Performed at [facility], [address] [place], [place] [address]    Culture   Final    NO GROWTH 4 DAYS Performed at [facility], [place] [address], [place],  [address]    Report Status PENDING  Incomplete  Resp Panel by RT-PCR (Flu A&B, Covid)     Status: None   Collection Time: 01/03/22  2:08 PM   Specimen: Nasal Swab  Result Value Ref Range Status   SARS Coronavirus 2 by RT PCR NEGATIVE NEGATIVE Final    Comment: (NOTE) SARS-CoV-2 target nucleic acids are NOT DETECTED.  The SARS-CoV-2 RNA is generally detectable in upper respiratory specimens during the acute phase of infection. The lowest concentration of SARS-CoV-2 viral copies this assay can detect is 138 copies/mL. A negative result does not preclude SARS-Cov-2 infection and should not be used as the sole basis for treatment or other patient management decisions. A negative result may occur with  improper specimen collection/handling, submission of specimen other than nasopharyngeal swab, presence of viral mutation(s) within the areas targeted by this assay, and inadequate number of viral copies(<138 copies/mL). A negative result must be combined with clinical observations, patient history, and epidemiological information. The expected result is Negative.  Fact Sheet for Patients:  EntrepreneurPulse.com.au  Fact Sheet for Healthcare Providers:   IncredibleEmployment.be  This test is no t yet approved or cleared by the [place] FDA and  has been authorized for detection and/or diagnosis of SARS-CoV-2 by FDA under an Emergency Use  Authorization (EUA). This EUA will remain  in effect (meaning this test can be used) for the duration of the COVID-19 declaration under Section 564(b)(1) of the Act, 21 U.S.C.section 360bbb-3(b)(1), unless the authorization is terminated  or revoked sooner.       Influenza A by PCR NEGATIVE NEGATIVE Final   Influenza B by PCR NEGATIVE NEGATIVE Final    Comment: (NOTE) The Xpert Xpress SARS-CoV-2/FLU/RSV plus assay is intended as an aid in the diagnosis of influenza from Nasopharyngeal swab specimens and should not be used as a sole basis for treatment. Nasal washings and aspirates are unacceptable for Xpert Xpress SARS-CoV-2/FLU/RSV testing.  Fact Sheet for Patients: EntrepreneurPulse.com.au  Fact Sheet for Healthcare Providers: IncredibleEmployment.be  This test is not yet approved or cleared by the [place] FDA and has been authorized for detection and/or diagnosis of SARS-CoV-2 by FDA under an Emergency Use Authorization (EUA). This EUA will remain in effect (meaning this test can be used) for the duration of the COVID-19 declaration under Section 564(b)(1) of the Act, 21 U.S.C. section 360bbb-3(b)(1), unless the authorization is terminated or revoked.  Performed at [facility], [address] [place], [place] [address]      Labs: BNP (last 3 results) No results for input(s): "BNP" in the last 8760 hours. Basic Metabolic Panel: Recent Labs  Lab 01/03/22 1047 01/04/22 0457 01/05/22 0319 01/06/22 0356 01/07/22 0333  NA 138 136 137 134* 135  K 3.6 3.1* 3.9 3.3* 3.5  CL 98 104 106 105 105  CO2 27 21* '23 23 25  '$ GLUCOSE 108* 79 87 102* 117*  BUN '11 11 13 14 11  '$ CREATININE 0.53 0.49 0.54 0.65  0.47  CALCIUM 9.3 7.8* 7.7* 7.4* 7.6*  MG  --  1.8  --  1.9 1.9   Liver Function Tests: Recent Labs  Lab 01/03/22 1047 01/04/22 0457 01/05/22 0319 01/06/22 0356 01/07/22 0333  AST 11* '16 21 24 '$ 48*  ALT '12 14 14 19 '$ 32  ALKPHOS 135* 126 136* 146* 191*  BILITOT 0.6 0.7 0.9 0.7 0.5  PROT 6.7 5.5* 5.5* 5.1* 5.4*  ALBUMIN 3.3* 2.1* 2.0* 1.8* 1.8*   No results for input(s): "LIPASE", "AMYLASE" in the last 168 hours. No results for input(s): "AMMONIA" in the last 168 hours. CBC: Recent Labs  Lab 01/03/22 1047 01/04/22 0457 01/04/22 1905 01/05/22 0319 01/06/22 0356 01/07/22 0333  WBC 1.5* 4.9  --  15.2* 30.5* 45.7*  NEUTROABS 0.3* 1.6*  --   --  16.2* 34.3*  HGB 7.3* 6.4* 7.9* 7.9* 8.4*  8.5* 8.7*  HCT 22.3* 20.1* 24.8* 24.6* 26.1*  25.9* 26.5*  MCV 85.8 87.0  --  85.1 83.7 84.7  PLT 209 162  --  183 185 202   Cardiac Enzymes: No results for input(s): "CKTOTAL", "CKMB", "CKMBINDEX", "TROPONINI" in the last 168 hours. BNP: Invalid input(s): "POCBNP" CBG: No results for input(s): "GLUCAP" in the last 168 hours. D-Dimer No results for input(s): "DDIMER" in the last 72 hours. Hgb A1c No results for input(s): "HGBA1C" in the last 72 hours. Lipid Profile No results for input(s): "CHOL", "HDL", "LDLCALC", "TRIG", "CHOLHDL", "LDLDIRECT" in the last 72 hours. Thyroid function studies No results for input(s): "TSH", "T4TOTAL", "T3FREE", "THYROIDAB" in the last 72 hours.  Invalid input(s): "FREET3" Anemia work up No results for input(s): "VITAMINB12", "FOLATE", "FERRITIN", "TIBC", "IRON", "RETICCTPCT" in the last 72 hours. Urinalysis    Component Value Date/Time   COLORURINE YELLOW 01/03/2022 [place] 01/03/2022 1408  LABSPEC >1.046 (H) 01/03/2022 1408   PHURINE 6.0 01/03/2022 1408   GLUCOSEU NEGATIVE 01/03/2022 1408   HGBUR TRACE (A) 01/03/2022 1408   BILIRUBINUR NEGATIVE 01/03/2022 1408   KETONESUR NEGATIVE 01/03/2022 1408   PROTEINUR 100 (A)  01/03/2022 1408   NITRITE NEGATIVE 01/03/2022 1408   LEUKOCYTESUR NEGATIVE 01/03/2022 1408   Sepsis Labs Recent Labs  Lab 01/04/22 0457 01/05/22 0319 01/06/22 0356 01/07/22 0333  WBC 4.9 15.2* 30.5* 45.7*   Microbiology Recent Results (from the past 240 hour(s))  Urine Culture     Status: Abnormal   Collection Time: 12/30/21  2:28 PM   Specimen: Urine, Clean Catch  Result Value Ref Range Status   Specimen Description   Final    URINE, CLEAN CATCH Performed at [facility], [place] [address] [place], [place] [address]    Special Requests   Final    NONE Performed at [facility], [place] [address] [place], [place] [address]    Culture MULTIPLE SPECIES PRESENT, SUGGEST RECOLLECTION (A)  Final   Report Status 12/31/2021 FINAL  Final  Culture, blood (single) w Reflex to ID Panel     Status: None   Collection Time: 12/30/21  2:43 PM   Specimen: BLOOD  Result Value Ref Range Status   Specimen Description BLOOD RIGHT ANTECUBITAL  Final   Special Requests   Final    BOTTLES DRAWN AEROBIC AND ANAEROBIC Blood Culture results may not be optimal due to an excessive volume of blood received in culture bottles   Culture   Final    NO GROWTH 5 DAYS Performed at [facility], [place] [address], [place], [place] [address]    Report Status 01/04/2022 FINAL  Final  Blood culture (routine x 2)     Status: None (Preliminary result)   Collection Time: 01/03/22 10:47 AM   Specimen: BLOOD  Result Value Ref Range Status   Specimen Description BLOOD LEFT ANTECUBITAL  Final   Special Requests   Final    BOTTLES DRAWN AEROBIC AND ANAEROBIC Blood Culture adequate volume   Culture   Final    NO GROWTH 4 DAYS Performed at [facility], [place] [address] [place], [place] [address]    Report Status PENDING  Incomplete  Blood culture (routine x 2)     Status: None (Preliminary result)   Collection Time: 01/03/22 12:13 PM   Specimen: BLOOD   Result Value Ref Range Status   Specimen Description   Final    BLOOD BLOOD RIGHT FOREARM Performed at [facility], [address] [place], [place] [address]    Special Requests   Final    Blood Culture adequate volume BOTTLES DRAWN AEROBIC AND ANAEROBIC Performed at [facility], [address] [place], [place] [address]    Culture   Final    NO GROWTH 4 DAYS Performed at [facility], [place] [address] [place], [place] [address]    Report Status PENDING  Incomplete  Resp Panel by RT-PCR (Flu A&B, Covid)     Status: None   Collection Time: 01/03/22  2:08 PM   Specimen: Nasal Swab  Result Value Ref Range Status   SARS Coronavirus 2 by RT PCR NEGATIVE NEGATIVE Final    Comment: (NOTE) SARS-CoV-2 target nucleic acids are NOT DETECTED.  The SARS-CoV-2 RNA is generally detectable in upper respiratory specimens during the acute phase of infection. The lowest concentration of SARS-CoV-2 viral copies this assay  can detect is 138 copies/mL. A negative result does not preclude SARS-Cov-2 infection and should not be used as the sole basis for treatment or other patient management decisions. A negative result may occur with  improper specimen collection/handling, submission of specimen other than nasopharyngeal swab, presence of viral mutation(s) within the areas targeted by this assay, and inadequate number of viral copies(<138 copies/mL). A negative result must be combined with clinical observations, patient history, and epidemiological information. The expected result is Negative.  Fact Sheet for Patients:  EntrepreneurPulse.com.au  Fact Sheet for Healthcare Providers:  IncredibleEmployment.be  This test is no t yet approved or cleared by the [place] FDA and  has been authorized for detection and/or diagnosis of SARS-CoV-2 by FDA under an Emergency Use Authorization (EUA). This EUA will remain   in effect (meaning this test can be used) for the duration of the COVID-19 declaration under Section 564(b)(1) of the Act, 21 U.S.C.section 360bbb-3(b)(1), unless the authorization is terminated  or revoked sooner.       Influenza A by PCR NEGATIVE NEGATIVE Final   Influenza B by PCR NEGATIVE NEGATIVE Final    Comment: (NOTE) The Xpert Xpress SARS-CoV-2/FLU/RSV plus assay is intended as an aid in the diagnosis of influenza from Nasopharyngeal swab specimens and should not be used as a sole basis for treatment. Nasal washings and aspirates are unacceptable for Xpert Xpress SARS-CoV-2/FLU/RSV testing.  Fact Sheet for Patients: EntrepreneurPulse.com.au  Fact Sheet for Healthcare Providers: IncredibleEmployment.be  This test is not yet approved or cleared by the [place] FDA and has been authorized for detection and/or diagnosis of SARS-CoV-2 by FDA under an Emergency Use Authorization (EUA). This EUA will remain in effect (meaning this test can be used) for the duration of the COVID-19 declaration under Section 564(b)(1) of the Act, 21 U.S.C. section 360bbb-3(b)(1), unless the authorization is terminated or revoked.  Performed at [facility], [address] [place], [place] [address]     Procedures/Studies: CT ABDOMEN PELVIS W CONTRAST  Result Date: 01/03/2022 CLINICAL DATA:  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 Diffuse abdominal swelling. Fever for 6 days. * Tracking Code: BO * EXAM: CT ABDOMEN AND PELVIS WITH CONTRAST TECHNIQUE: Multidetector CT imaging of the abdomen and pelvis was performed using the standard protocol following bolus administration of intravenous contrast. RADIATION DOSE REDUCTION: This exam was performed according to the departmental dose-optimization program which includes automated exposure control, adjustment of the mA and/or kV according to  patient size and/or use of iterative reconstruction technique. CONTRAST:  27m OMNIPAQUE IOHEXOL 350 MG/ML SOLN COMPARISON:  11/17/2021 FINDINGS: Lower chest: Unremarkable Hepatobiliary: Heterogeneous likely subcapsular mass in the dome of the liver primarily of the right hepatic lobe with speckled internal calcifications, measuring 10.8 by 5.3 by 8.7 cm (volume = 260 cm^3), previously 9.6 by 5.2 by 8.8 cm (volume = 230 cm^3) on 12/07/2021. Lobularity along the margin with the hepatic parenchyma suggests hepatic invasion. Contracted gallbladder. Perihepatic ascites noted. No new hepatic lesion observed. No biliary dilatation. Pancreas: Unremarkable Spleen: Unremarkable Adrenals/Urinary Tract: Suspected 2 mm left kidney lower pole nonobstructive renal calculus, image 33 series 2. Adrenal glands unremarkable. No hydronephrosis. Urinary bladder unremarkable. Stomach/Bowel: Displacement of bowel loops by the large central abdominopelvic tumor. No current dilated bowel. Vascular/Lymphatic: Mild abdominal aortic atherosclerotic vascular disease. Reproductive: Tracking above and around the uterus and filling a substantial portion of the abdominopelvic cavity, we demonstrate a 24.8 by 15.1 by 17.6 cm (volume = 3450 cm^3)  heterogeneous mass with scattered calcifications compatible with malignancy. Prior CT abdomen did not include the pelvis and accordingly size wise this is difficult to compare. However, the abdominal component is similar to previous. This may be arising from the ovaries with slight eccentricity to the right in the pelvis. Other: Scattered malignant ascites. A cystic and solid right omental deposit measures 9.3 by 4.3 cm on image 47 series 2, formerly 8.4 by 3.7 cm. There stranding and some mild nodularity along the rest of the omentum. Perihepatic and perisplenic ascites noted along with ascites tracking in the paracolic gutter and in the pelvis. Musculoskeletal: Unremarkable IMPRESSION: 1. Large central  abdominopelvic mass compatible with malignancy, 3450 cubic cm in volume. This may be arising from the ovaries with slight eccentricity to the right in the pelvis. 2. Mild increase in size of the right omental tumor deposit. 3. Mild increase in size of the subcapsular mass along the dome of the right hepatic lobe, with suspected hepatic invasion. 4. Scattered malignant ascites. 5. No dilated bowel to suggest obstruction. 6. 2 mm left kidney lower pole nonobstructive renal calculus. 7. Mild abdominal aortic atherosclerotic vascular disease. Aortic Atherosclerosis (ICD10-I70.0). Electronically Signed   By: Van Clines M.D.   On: 01/03/2022 13:52   DG Chest 1 View  Result Date: 01/03/2022 CLINICAL DATA:  Fever of unknown origin. EXAM: CHEST  1 VIEW COMPARISON:  12/30/2021 FINDINGS: There is a right chest wall port a catheter with tip in the projection of the SVC. Normal heart size. No pleural effusion or edema. No airspace opacities identified. Visualized osseous structures are unremarkable. IMPRESSION: No active disease. Electronically Signed   By: Kerby Moors M.D.   On: 01/03/2022 11:32   DG Chest 2 View  Result Date: 12/30/2021 CLINICAL DATA:  Fever EXAM: CHEST - 2 VIEW COMPARISON:  CT chest done on 12/07/2021 FINDINGS: Cardiac size is within normal limits. There are no signs of pulmonary edema or focal pulmonary consolidation. There is no pleural effusion or pneumothorax. Tip of right IJ chest port is seen in superior vena cava. IMPRESSION: No active cardiopulmonary disease. Electronically Signed   By: Elmer Picker M.D.   On: 12/30/2021 13:54   ECHOCARDIOGRAM COMPLETE  Result Date: 12/29/2021    ECHOCARDIOGRAM REPORT   Patient Name:   VELORA HORSTMAN Date of Exam: 12/29/2021 Medical Rec #:  [id number]      Height:       59.0 in Accession #:    [id number]     Weight:       122.0 lb Date of Birth:  1970/08/09      BSA:          1.495 m Patient Age:    36 years       BP:           100/60  mmHg Patient Gender: F              HR:           107 bpm. Exam Location:  Outpatient Procedure: 2D Echo, 3D Echo, Color Doppler, Cardiac Doppler and Strain Analysis Indications:    R00.0 Tachycardia  History:        Patient has no prior history of Echocardiogram examinations.                 Risk Factors:Non-Smoker. Malignant germ cell tumor of ovary;                 possibility of impingement of the  vena cava by her abdominal                 mass.  Sonographer:    Leavy Cella RDCS Referring Phys: [id number] [place]  1. Left ventricular ejection fraction, by estimation, is 55 to 60%. The left ventricle has normal function. The left ventricle has no regional wall motion abnormalities. Indeterminate diastolic filling due to E-A fusion.  2. Right ventricular systolic function is normal. The right ventricular size is normal. Tricuspid regurgitation signal is inadequate for assessing PA pressure.  3. The mitral valve is grossly normal. No evidence of mitral valve regurgitation. No evidence of mitral stenosis.  4. The aortic valve is tricuspid. Aortic valve regurgitation is not visualized. No aortic stenosis is present.  5. The inferior vena cava is normal in size with greater than 50% respiratory variability, suggesting right atrial pressure of 3 mmHg. Conclusion(s)/Recommendation(s): Normal biventricular function without evidence of hemodynamically significant valvular heart disease. FINDINGS  Left Ventricle: Left ventricular ejection fraction, by estimation, is 55 to 60%. The left ventricle has normal function. The left ventricle has no regional wall motion abnormalities. Global longitudinal strain performed but not reported based on interpreter judgement due to suboptimal tracking. 3D left ventricular ejection fraction analysis performed but not reported based on interpreter judgement due to suboptimal tracking. The left ventricular internal cavity size was normal in size. There is no left  ventricular hypertrophy. Indeterminate diastolic filling due to E-A fusion. Right Ventricle: The right ventricular size is normal. No increase in right ventricular wall thickness. Right ventricular systolic function is normal. Tricuspid regurgitation signal is inadequate for assessing PA pressure. Left Atrium: Left atrial size was normal in size. Right Atrium: Right atrial size was normal in size. Pericardium: There is no evidence of pericardial effusion. Mitral Valve: The mitral valve is grossly normal. No evidence of mitral valve regurgitation. No evidence of mitral valve stenosis. Tricuspid Valve: The tricuspid valve is grossly normal. Tricuspid valve regurgitation is not demonstrated. No evidence of tricuspid stenosis. Aortic Valve: The aortic valve is tricuspid. Aortic valve regurgitation is not visualized. No aortic stenosis is present. Pulmonic Valve: The pulmonic valve was grossly normal. Pulmonic valve regurgitation is not visualized. No evidence of pulmonic stenosis. Aorta: The aortic root and ascending aorta are structurally normal, with no evidence of dilitation. Venous: The inferior vena cava is normal in size with greater than 50% respiratory variability, suggesting right atrial pressure of 3 mmHg. IAS/Shunts: The atrial septum is grossly normal.  LEFT VENTRICLE PLAX 2D LVIDd:         3.95 cm   Diastology LVIDs:         2.14 cm   LV e' medial:    11.20 cm/s LV PW:         0.92 cm   LV E/e' medial:  6.1 LV IVS:        0.74 cm   LV e' lateral:   13.80 cm/s LVOT diam:     2.00 cm   LV E/e' lateral: 5.0 LV SV:         41 LV SV Index:   28 LVOT Area:     3.14 cm                           3D Volume EF:  3D EF:        50 %                          LV EDV:       191 ml                          LV ESV:       96 ml                          LV SV:        95 ml RIGHT VENTRICLE RV Basal diam:  2.72 cm RV Mid diam:    2.27 cm RV S prime:     18.60 cm/s TAPSE (M-mode): 1.9 cm LEFT ATRIUM            Index        RIGHT ATRIUM          Index LA diam:      2.80 cm 1.87 cm/m   RA Area:     8.16 cm LA Vol (A2C): 8.1 ml  5.44 ml/m   RA Volume:   15.40 ml 10.30 ml/m LA Vol (A4C): 24.4 ml 16.32 ml/m  AORTIC VALVE LVOT Vmax:   95.00 cm/s LVOT Vmean:  59.800 cm/s LVOT VTI:    0.132 m  AORTA Ao Root diam: 2.40 cm Ao Asc diam:  2.40 cm MITRAL VALVE MV Area (PHT): 4.06 cm    SHUNTS MV Decel Time: 187 msec    Systemic VTI:  0.13 m MV E velocity: 68.60 cm/s  Systemic Diam: 2.00 cm MV A velocity: 63.30 cm/s MV E/A ratio:  1.08 Eleonore Chiquito MD Electronically signed by Eleonore Chiquito MD Signature Date/Time: 12/29/2021/4:27:03 PM    Final      Time coordinating discharge: Over 30 minutes    Dwyane Dee, MD  [facility] 01/07/2022, 12:44 PM

## 2022-01-07 NOTE — Progress Notes (Signed)
   01/07/22 1302  Assess: MEWS Score  Temp 99.9 F (37.7 C)  BP 112/70  MAP (mmHg) 82  Pulse Rate (!) 123  Resp 18  Level of Consciousness Alert  SpO2 97 %  O2 Device Room Air  Assess: MEWS Score  MEWS Temp 0  MEWS Systolic 0  MEWS Pulse 2  MEWS RR 0  MEWS LOC 0  MEWS Score 2  MEWS Score Color Yellow  Assess: if the MEWS score is Yellow or Red  Were vital signs taken at a resting state? Yes  Focused Assessment Change from prior assessment (see assessment flowsheet)  Does the patient meet 2 or more of the SIRS criteria? Yes  Does the patient have a confirmed or suspected source of infection? Yes  Provider and Rapid Response Notified? No  MEWS guidelines implemented *See Row Information* Yes  Treat  MEWS Interventions Other (Comment) (Per patient wanted rest/calm enviornment does not want anxiety or pain medication at this time)  Pain Scale 0-10  Pain Score 2  Pain Type Acute pain  Pain Location Abdomen  Pain Orientation Upper  Pain Intervention(s) Emotional support;Rest  Complains of Anxiety  Interventions Relaxation;Patient refused medication  Neuro symptoms relieved by Rest  Patients response to intervention Unchanged  Take Vital Signs  Increase Vital Sign Frequency  Yellow: Q 2hr X 2 then Q 4hr X 2, if remains yellow, continue Q 4hrs  Escalate  MEWS: Escalate Yellow: discuss with charge nurse/RN and consider discussing with provider and RRT  Notify: Charge Nurse/RN  Name of Charge Nurse/RN Notified Chancy Hurter  Date Charge Nurse/RN Notified 01/07/22  Time Charge Nurse/RN Notified 1400  Document  Progress note created (see row info) Yes  Assess: SIRS CRITERIA  SIRS Temperature  0  SIRS Pulse 1  SIRS Respirations  0  SIRS WBC 1  SIRS Score Sum  2

## 2022-01-08 ENCOUNTER — Encounter: Admit: 2022-01-08 | Payer: PRIVATE HEALTH INSURANCE

## 2022-01-08 ENCOUNTER — Ambulatory Visit: Admit: 2022-01-08 | Discharge: 2022-01-19 | Payer: PRIVATE HEALTH INSURANCE

## 2022-01-08 ENCOUNTER — Ambulatory Visit: Admit: 2022-01-08 | Payer: PRIVATE HEALTH INSURANCE

## 2022-01-08 ENCOUNTER — Ambulatory Visit
Admit: 2022-01-08 | Discharge: 2022-01-19 | Disposition: A | Discharge status: Home / Self Care | Payer: PRIVATE HEALTH INSURANCE | Source: Other Acute Inpatient Hospital

## 2022-01-08 ENCOUNTER — Encounter: Admit: 2022-01-08 | Discharge: 2022-01-19 | Payer: PRIVATE HEALTH INSURANCE

## 2022-01-08 ENCOUNTER — Other Ambulatory Visit (HOSPITAL_COMMUNITY): Payer: Self-pay

## 2022-01-08 DIAGNOSIS — Z8249 Family history of ischemic heart disease and other diseases of the circulatory system: Secondary | ICD-10-CM | POA: Diagnosis not present

## 2022-01-08 DIAGNOSIS — E860 Dehydration: Secondary | ICD-10-CM | POA: Diagnosis not present

## 2022-01-08 DIAGNOSIS — T368X5A Adverse effect of other systemic antibiotics, initial encounter: Secondary | ICD-10-CM | POA: Diagnosis not present

## 2022-01-08 DIAGNOSIS — C563 Malignant neoplasm of bilateral ovaries: Secondary | ICD-10-CM | POA: Diagnosis not present

## 2022-01-08 DIAGNOSIS — R6510 Systemic inflammatory response syndrome (SIRS) of non-infectious origin without acute organ dysfunction: Secondary | ICD-10-CM | POA: Diagnosis not present

## 2022-01-08 DIAGNOSIS — Z7901 Long term (current) use of anticoagulants: Secondary | ICD-10-CM | POA: Diagnosis not present

## 2022-01-08 DIAGNOSIS — Z823 Family history of stroke: Secondary | ICD-10-CM | POA: Diagnosis not present

## 2022-01-08 DIAGNOSIS — R180 Malignant ascites: Secondary | ICD-10-CM | POA: Diagnosis not present

## 2022-01-08 DIAGNOSIS — D709 Neutropenia, unspecified: Secondary | ICD-10-CM | POA: Diagnosis present

## 2022-01-08 DIAGNOSIS — Z807 Family history of other malignant neoplasms of lymphoid, hematopoietic and related tissues: Secondary | ICD-10-CM | POA: Diagnosis not present

## 2022-01-08 DIAGNOSIS — Z803 Family history of malignant neoplasm of breast: Secondary | ICD-10-CM | POA: Diagnosis not present

## 2022-01-08 DIAGNOSIS — Z833 Family history of diabetes mellitus: Secondary | ICD-10-CM | POA: Diagnosis not present

## 2022-01-08 DIAGNOSIS — E440 Moderate protein-calorie malnutrition: Secondary | ICD-10-CM | POA: Diagnosis not present

## 2022-01-08 DIAGNOSIS — R5081 Fever presenting with conditions classified elsewhere: Secondary | ICD-10-CM | POA: Diagnosis not present

## 2022-01-08 DIAGNOSIS — Z79899 Other long term (current) drug therapy: Secondary | ICD-10-CM | POA: Diagnosis not present

## 2022-01-08 DIAGNOSIS — G893 Neoplasm related pain (acute) (chronic): Secondary | ICD-10-CM | POA: Diagnosis not present

## 2022-01-08 DIAGNOSIS — K219 Gastro-esophageal reflux disease without esophagitis: Secondary | ICD-10-CM | POA: Diagnosis not present

## 2022-01-08 DIAGNOSIS — E876 Hypokalemia: Secondary | ICD-10-CM | POA: Diagnosis not present

## 2022-01-08 DIAGNOSIS — F411 Generalized anxiety disorder: Secondary | ICD-10-CM | POA: Diagnosis not present

## 2022-01-08 DIAGNOSIS — C787 Secondary malignant neoplasm of liver and intrahepatic bile duct: Secondary | ICD-10-CM | POA: Diagnosis not present

## 2022-01-08 DIAGNOSIS — K521 Toxic gastroenteritis and colitis: Secondary | ICD-10-CM | POA: Diagnosis not present

## 2022-01-08 DIAGNOSIS — Z86711 Personal history of pulmonary embolism: Secondary | ICD-10-CM | POA: Diagnosis not present

## 2022-01-08 DIAGNOSIS — T451X5A Adverse effect of antineoplastic and immunosuppressive drugs, initial encounter: Secondary | ICD-10-CM | POA: Diagnosis not present

## 2022-01-08 DIAGNOSIS — Z20822 Contact with and (suspected) exposure to covid-19: Secondary | ICD-10-CM | POA: Diagnosis not present

## 2022-01-08 DIAGNOSIS — D701 Agranulocytosis secondary to cancer chemotherapy: Secondary | ICD-10-CM | POA: Diagnosis not present

## 2022-01-08 LAB — CULTURE, BLOOD (ROUTINE X 2)
Culture: NO GROWTH
Culture: NO GROWTH
Special Requests: ADEQUATE
Special Requests: ADEQUATE

## 2022-01-10 ENCOUNTER — Other Ambulatory Visit: Payer: 59 | Admitting: Licensed Clinical Social Worker

## 2022-01-10 ENCOUNTER — Other Ambulatory Visit: Payer: 59

## 2022-01-10 ENCOUNTER — Ambulatory Visit: Payer: 59

## 2022-01-10 ENCOUNTER — Ambulatory Visit: Payer: 59 | Admitting: Hematology and Oncology

## 2022-01-11 ENCOUNTER — Ambulatory Visit: Payer: 59

## 2022-01-11 DIAGNOSIS — C569 Malignant neoplasm of unspecified ovary: Principal | ICD-10-CM

## 2022-01-12 ENCOUNTER — Ambulatory Visit: Payer: 59

## 2022-01-13 ENCOUNTER — Ambulatory Visit: Payer: 59

## 2022-01-14 ENCOUNTER — Ambulatory Visit: Payer: 59

## 2022-01-17 ENCOUNTER — Ambulatory Visit: Payer: 59

## 2022-01-17 DIAGNOSIS — C569 Malignant neoplasm of unspecified ovary: Principal | ICD-10-CM

## 2022-01-18 ENCOUNTER — Other Ambulatory Visit: Payer: 59

## 2022-01-18 ENCOUNTER — Ambulatory Visit: Payer: 59

## 2022-01-18 ENCOUNTER — Ambulatory Visit: Payer: 59 | Admitting: Hematology and Oncology

## 2022-01-19 ENCOUNTER — Other Ambulatory Visit: Payer: Self-pay | Admitting: Gynecologic Oncology

## 2022-01-19 DIAGNOSIS — C569 Malignant neoplasm of unspecified ovary: Secondary | ICD-10-CM

## 2022-01-19 DIAGNOSIS — R5081 Fever presenting with conditions classified elsewhere: Principal | ICD-10-CM

## 2022-01-19 DIAGNOSIS — Z7689 Persons encountering health services in other specified circumstances: Principal | ICD-10-CM

## 2022-01-19 DIAGNOSIS — D709 Neutropenia, unspecified: Principal | ICD-10-CM

## 2022-01-19 MED ORDER — NEULASTA 6 MG/0.6 ML SUBCUTANEOUS SYRINGE
0 refills | 0 days | Status: CP
Start: 2022-01-19 — End: 2022-01-19
  Filled 2022-01-19: qty 0.6, 21d supply, fill #0

## 2022-01-19 MED ORDER — ZIEXTENZO 6 MG/0.6 ML SUBCUTANEOUS SYRINGE
SUBCUTANEOUS | 2 refills | 21 days | Status: CP
Start: 2022-01-19 — End: 2022-01-19

## 2022-01-19 MED ORDER — UDENYCA 6 MG/0.6 ML SUBCUTANEOUS SYRINGE
SUBCUTANEOUS | 0 refills | 21 days
Start: 2022-01-19 — End: 2022-01-19

## 2022-01-19 MED ORDER — SIMETHICONE 80 MG CHEWABLE TABLET
ORAL_TABLET | Freq: Four times a day (QID) | ORAL | 0 refills | 25 days | Status: CP | PRN
Start: 2022-01-19 — End: 2022-02-18
  Filled 2022-01-19: qty 100, 25d supply, fill #0

## 2022-01-19 MED ORDER — ENOXAPARIN 40 MG/0.4 ML SUBCUTANEOUS SYRINGE
0 refills | 0 days
Start: 2022-01-19 — End: 2022-01-19

## 2022-01-19 MED ORDER — ONDANSETRON 8 MG DISINTEGRATING TABLET
ORAL_TABLET | Freq: Three times a day (TID) | ORAL | 0 refills | 20 days | Status: CP | PRN
Start: 2022-01-19 — End: 2022-02-18
  Filled 2022-01-19: qty 60, 20d supply, fill #0

## 2022-01-19 MED ORDER — ELIQUIS 2.5 MG TABLET
ORAL_TABLET | 0 refills | 0 days
Start: 2022-01-19 — End: 2022-01-19

## 2022-01-19 MED ORDER — DOCUSATE SODIUM 100 MG CAPSULE
ORAL_CAPSULE | Freq: Two times a day (BID) | ORAL | 0 refills | 30 days | Status: CP
Start: 2022-01-19 — End: 2022-02-18
  Filled 2022-01-19: qty 60, 30d supply, fill #0

## 2022-01-19 MED ORDER — PROCHLORPERAZINE MALEATE 10 MG TABLET
ORAL_TABLET | Freq: Four times a day (QID) | ORAL | 0 refills | 8 days | Status: CP | PRN
Start: 2022-01-19 — End: 2022-01-27
  Filled 2022-01-19: qty 30, 8d supply, fill #0

## 2022-01-19 MED ORDER — ZARXIO 300 MCG/0.5 ML INJECTION SYRINGE
SUBCUTANEOUS | 0 refills | 5 days
Start: 2022-01-19 — End: 2022-01-19

## 2022-01-20 DIAGNOSIS — C569 Malignant neoplasm of unspecified ovary: Principal | ICD-10-CM

## 2022-01-20 MED ORDER — POLYETHYLENE GLYCOL 3350 17 GRAM/DOSE ORAL POWDER
Freq: Every day | ORAL | 0 refills | 28 days | Status: CP
Start: 2022-01-20 — End: 2022-02-19
  Filled 2022-01-19: qty 238, 14d supply, fill #0

## 2022-01-20 MED ORDER — FLUOXETINE 10 MG CAPSULE
ORAL_CAPSULE | Freq: Every day | ORAL | 0 refills | 30 days | Status: CP
Start: 2022-01-20 — End: 2022-02-19
  Filled 2022-01-19: qty 30, 30d supply, fill #0

## 2022-01-20 MED ORDER — MULTIVITAMIN-IRON 9 MG-FOLIC ACID 400 MCG-CALCIUM AND MINERALS TABLET
ORAL_TABLET | Freq: Every day | ORAL | 1 refills | 130 days | Status: CP
Start: 2022-01-20 — End: 2022-07-19
  Filled 2022-01-19: qty 130, 130d supply, fill #0

## 2022-01-21 ENCOUNTER — Telehealth: Payer: Self-pay | Admitting: *Deleted

## 2022-01-21 ENCOUNTER — Other Ambulatory Visit: Payer: Self-pay | Admitting: Hematology and Oncology

## 2022-01-21 DIAGNOSIS — D61818 Other pancytopenia: Secondary | ICD-10-CM

## 2022-01-21 DIAGNOSIS — R5081 Fever presenting with conditions classified elsewhere: Principal | ICD-10-CM

## 2022-01-21 DIAGNOSIS — C569 Malignant neoplasm of unspecified ovary: Principal | ICD-10-CM

## 2022-01-21 DIAGNOSIS — D709 Neutropenia, unspecified: Principal | ICD-10-CM

## 2022-01-21 MED ORDER — NEULASTA 6 MG/0.6 ML SUBCUTANEOUS SYRINGE
SUBCUTANEOUS | 1 refills | 21 days | Status: CP
Start: 2022-01-21 — End: ?

## 2022-01-21 NOTE — Telephone Encounter (Signed)
Per Dr Berline Lopes, patient to be scheduled for a CT scan end of December. Patient scheduled for a CT on 12/27 at 12:30 pm. Patient aware and given the date/time/instructions for the appt

## 2022-01-24 ENCOUNTER — Encounter: Payer: Self-pay | Admitting: Surgery

## 2022-01-24 ENCOUNTER — Telehealth: Payer: Self-pay

## 2022-01-24 ENCOUNTER — Other Ambulatory Visit (HOSPITAL_COMMUNITY): Payer: Self-pay

## 2022-01-24 ENCOUNTER — Other Ambulatory Visit: Payer: Self-pay

## 2022-01-24 ENCOUNTER — Telehealth: Payer: Self-pay | Admitting: Surgery

## 2022-01-24 ENCOUNTER — Other Ambulatory Visit: Payer: Self-pay | Admitting: Gynecologic Oncology

## 2022-01-24 ENCOUNTER — Inpatient Hospital Stay: Payer: 59 | Attending: Gynecologic Oncology

## 2022-01-24 DIAGNOSIS — R509 Fever, unspecified: Secondary | ICD-10-CM

## 2022-01-24 DIAGNOSIS — Z79899 Other long term (current) drug therapy: Secondary | ICD-10-CM

## 2022-01-24 DIAGNOSIS — C569 Malignant neoplasm of unspecified ovary: Secondary | ICD-10-CM

## 2022-01-24 DIAGNOSIS — D649 Anemia, unspecified: Secondary | ICD-10-CM | POA: Diagnosis present

## 2022-01-24 DIAGNOSIS — D61818 Other pancytopenia: Secondary | ICD-10-CM

## 2022-01-24 LAB — CMP (CANCER CENTER ONLY)
ALT: 26 U/L (ref 0–44)
AST: 16 U/L (ref 15–41)
Albumin: 3.2 g/dL — ABNORMAL LOW (ref 3.5–5.0)
Alkaline Phosphatase: 148 U/L — ABNORMAL HIGH (ref 38–126)
Anion gap: 10 (ref 5–15)
BUN: 8 mg/dL (ref 6–20)
CO2: 30 mmol/L (ref 22–32)
Calcium: 9.2 mg/dL (ref 8.9–10.3)
Chloride: 94 mmol/L — ABNORMAL LOW (ref 98–111)
Creatinine: 0.49 mg/dL (ref 0.44–1.00)
GFR, Estimated: >60 mL/min (ref >60)
Glucose, Bld: 135 mg/dL — ABNORMAL HIGH (ref 70–99)
Potassium: 3.3 mmol/L — ABNORMAL LOW (ref 3.5–5.1)
Sodium: 134 mmol/L — ABNORMAL LOW (ref 135–145)
Total Bilirubin: 0.3 mg/dL (ref 0.3–1.2)
Total Protein: 6.3 g/dL — ABNORMAL LOW (ref 6.5–8.1)

## 2022-01-24 LAB — CBC WITH DIFFERENTIAL (CANCER CENTER ONLY)
Abs Immature Granulocytes: 3.1 10*3/uL — ABNORMAL HIGH (ref 0.00–0.07)
Band Neutrophils: 10 %
Basophils Absolute: 0 10*3/uL (ref 0.0–0.1)
Basophils Relative: 0 %
Eosinophils Absolute: 0 10*3/uL (ref 0.0–0.5)
Eosinophils Relative: 0 %
HCT: 31.2 % — ABNORMAL LOW (ref 36.0–46.0)
Hemoglobin: 10.3 g/dL — ABNORMAL LOW (ref 12.0–15.0)
Lymphocytes Relative: 21 %
Lymphs Abs: 4.9 10*3/uL — ABNORMAL HIGH (ref 0.7–4.0)
MCH: 27.8 pg (ref 26.0–34.0)
MCHC: 33 g/dL (ref 30.0–36.0)
MCV: 84.1 fL (ref 80.0–100.0)
Metamyelocytes Relative: 7 %
Monocytes Absolute: 5.6 10*3/uL — ABNORMAL HIGH (ref 0.1–1.0)
Monocytes Relative: 24 %
Myelocytes: 6 %
Neutro Abs: 9.9 10*3/uL — ABNORMAL HIGH (ref 1.7–7.7)
Neutrophils Relative %: 32 %
Platelet Count: 293 10*3/uL (ref 150–400)
RBC: 3.71 MIL/uL — ABNORMAL LOW (ref 3.87–5.11)
RDW: 16.5 % — ABNORMAL HIGH (ref 11.5–15.5)
WBC Count: 23.5 10*3/uL — ABNORMAL HIGH (ref 4.0–10.5)
nRBC: 0.1 % (ref 0.0–0.2)

## 2022-01-24 LAB — SAMPLE TO BLOOD BANK

## 2022-01-24 MED ORDER — APIXABAN 2.5 MG PO TABS
2.5000 mg | ORAL_TABLET | Freq: Two times a day (BID) | ORAL | 0 refills | Status: DC
  Filled 2022-01-24: qty 60, 30d supply, fill #0

## 2022-01-24 NOTE — Telephone Encounter (Addendum)
Patient came in to office today prior to her scheduled lab appointment. Vital signs done and brief assessment. Patient states she hasn't taken any Tylenol since before calling our office this morning. She was experiencing some nausea and took nausea medication. Patient states she is feeling worsening weakness that began yesterday as well.   Vital signs as follows:  T: 98.6 oral BP: 118/73 P: 128 R: 16 O2: 98% RA  Weight 107.7 lb (states this is a drop from her last weight in the hospital)  Patient advised that our office would call her once we have her lab results with recommendations from Dr Berline Lopes. Patient verbalized understanding and had no further questions.

## 2022-01-24 NOTE — Progress Notes (Signed)
Refill for Eliquis sent in per Dr. Berline Lopes.

## 2022-01-24 NOTE — Telephone Encounter (Signed)
Please plan to get vital signs when she comes in. Melissa, can we add blood cultures and a urine culture?

## 2022-01-24 NOTE — Telephone Encounter (Signed)
Patient called in stating she needs a refill on her Eliquis. She is also concerned because she started feeling like she has a fever again and her temp last night was 99.7. She took Tylenol and it came down but she is concerned fevers will return due to her recent hospitalization for fevers. Patient states she has labs today at 1pm at [facility] so she could come in if she needs to be seen.

## 2022-01-24 NOTE — Telephone Encounter (Signed)
Courtesy call from [facility] lab),  Hgb 10.3.

## 2022-01-24 NOTE — Telephone Encounter (Signed)
Called patient and asked her to come in prior to her lab appointment today for our office to check her vital signs. Patient informed of blood cultures and urine cultures added to her lab appointment. Patient verbalized understanding and had no further concerns at this time.

## 2022-01-25 ENCOUNTER — Ambulatory Visit: Admit: 2022-01-25 | Discharge: 2022-01-26 | Payer: PRIVATE HEALTH INSURANCE

## 2022-01-25 ENCOUNTER — Telehealth: Payer: Self-pay

## 2022-01-25 ENCOUNTER — Ambulatory Visit: Payer: 59 | Admitting: Hematology and Oncology

## 2022-01-25 ENCOUNTER — Other Ambulatory Visit: Payer: 59

## 2022-01-25 ENCOUNTER — Ambulatory Visit: Payer: 59

## 2022-01-25 DIAGNOSIS — C569 Malignant neoplasm of unspecified ovary: Principal | ICD-10-CM

## 2022-01-25 DIAGNOSIS — R059 Cough, unspecified type: Principal | ICD-10-CM

## 2022-01-25 LAB — URINE CULTURE: Culture: NO GROWTH

## 2022-01-25 NOTE — Telephone Encounter (Signed)
Spoke with patient regarding FMLA Forms received from [employer] on 01/07/2022. Forms were completed on 01/13/22 with different Intake Number. Patient states that leave of absence was approved and that new forms are not necessary at this time. No other needs or concerns voiced at this time.

## 2022-01-25 NOTE — Telephone Encounter (Signed)
Charting Error. Duplicate Note

## 2022-01-27 ENCOUNTER — Ambulatory Visit: Admit: 2022-01-27 | Discharge: 2022-01-28 | Payer: PRIVATE HEALTH INSURANCE

## 2022-01-27 ENCOUNTER — Encounter: Payer: Self-pay | Admitting: Hematology and Oncology

## 2022-01-27 ENCOUNTER — Telehealth: Payer: Self-pay

## 2022-01-27 DIAGNOSIS — R000 Tachycardia, unspecified: Principal | ICD-10-CM

## 2022-01-27 DIAGNOSIS — C569 Malignant neoplasm of unspecified ovary: Principal | ICD-10-CM

## 2022-01-27 DIAGNOSIS — Z09 Encounter for follow-up examination after completed treatment for conditions other than malignant neoplasm: Principal | ICD-10-CM

## 2022-01-27 NOTE — Telephone Encounter (Signed)
Per Dr. Berline Lopes She needs to be evaluated. I don't have a way to do this today because of surgery. She either can go to [place] or she can come into the emergency department here.   Please tell her to call Jeani Hawking at [facility]. They may be able to get her in to see a fellow for an outpatient visit rather than having to go to the emergency department.   Pt aware of message and states she called Je[facility] Hawking and had to leave a message. She states " I just don't want to go to [facility], I may go to [facility] ER, they may be faster"  Dr. Berline Lopes notified.

## 2022-01-27 NOTE — Telephone Encounter (Addendum)
Pt called this morning stating she had Chemo at [facility] on Tuesday. Yesterday and this morning her heart rate is between 128-132, fever has been anywhere between 99.0-100.4. She feels uncomfortable. SOB but no chest pains  I advised patient I will notify Dr. Berline Lopes who is in the OR, I also advised her to call [facility] and let them know what was going on especially if this could be a reaction to Chemo med. She sounded tearful stating "I don't want to continue care at [facility] I wasn't to stay in [facility]"  She states she will call [facility].  Dr. Berline Lopes notified

## 2022-01-29 ENCOUNTER — Encounter: Payer: Self-pay | Admitting: Hematology and Oncology

## 2022-01-29 LAB — CULTURE, BLOOD (SINGLE): Culture: NO GROWTH

## 2022-01-30 NOTE — Unmapped (Incomplete)
Gynecologic Oncology Return Clinic Visit    Date of Service: 02/04/2022    Assessment & Plan:  Debra Ponce is a 51 y.o. 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    Saw Dr Vanessa Ralphs with surgical oncology earlier this morning in anticipation of planned combined case and consideration of possible HIPEC    Meidcal comorbidities complicating care:  Leukocytosis and persistent low grade fevers 2/2 large tumor burden: will continue to monitor closely. Previous extensive infection evaluation negative. 91.9 today  Tachycardia: Metoprolol XL 50mg  daily started 12/19  Anemia: Hgb today 8.7, see product plan as below  GAD: Prozac 10mg  daily, Ativan prn  Constipation: Docusate BID, Senna QD, Miralax QD   Nutritional status: Albumin previously 1.7 on 11/29, improved to 2.4 on 12/14 however still low. Continue nutrition shakes BID as able  VTE ppx: currently on Eliquis 2.5mg  BID, instructed for last dose to be taken on 1/27 PM  Prior surgical hx: bilateral tubal ligation   Antibodies on type and screen: crossed blood has been challenging in the past, repeat type and screen ordered today. Discussed with blood bank, and will continue to address with them for prepared product in anticipation of her large procedure on 02/15/22      PLAN  Consented for pelvic exam under anesthesia, exploratory laparotomy, total abdominal hysterectomy, bilateral salpingo-oophorectomy, tumor debulking and staging including possible omentectomy, lymphadenectomy, possible bowel resection, possible ostomy, and any other indicated procedures on 02/15/22 with Dr Eugene Garnet, combined case with Dr Vanessa Ralphs (surgical oncology). Dr Georgie Chard team will obtain separate surgical consent.    Bowel preparation to be completed the day prior to surgery. Prescriptions and instructions provided by Dr Georgie Chard team this morning.     The risks of surgery were discussed in detail and she understands these include but are not limited to infection, wound separation, hernia, vaginal cuff separation, injury to adjacent organs, bleeding which may require blood transfusion, anesthesia risk, thromboembolic events, possible death, unforseen complications, possible need for re-exploration, medical complications such as heart attack, stroke, pneumonia, possible risk of lymphedema and lymphocyst if lymphadenectomy performed, and infection .     If the patient experiences any of these events, she understands that her hospitalization or recovery may be prolonged and that she may need to take additional medications for a prolonged period. The patient will receive DVT and antibiotic prophylaxis as indicated. She voiced a clear understanding. She had the opportunity to ask questions and written informed consent was obtained today. She wishes to proceed.     She will proceed to the lab today for bloodwork including CBC, T&S, CMP, CA125, PT/INR. She does not require preoperative clearance.  Her METs are >4. All preoperative instructions were reviewed.  Postoperative expectations were also reviewed.  Written handouts were also provided to the patient regarding preoperative instructions.        Dr. Livia Snellen was immediately available.      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  She continues to report ongoing undulating low-grade fevers, reports her temperature typically averages in the 99 ??F, rarely gets up into the 100, never above 100.4.  She has been taking Tylenol around-the-clock which seems to be helping.  She reports the metoprolol seems to be helping with her heart rate, the only time she notices the elevation in heart rate is at the time of higher fever.    She denies any ongoing chest pain or shortness of breath that she was experiencing the last time she was seen.  She does actually feel her appetite has been improved, and she is tolerating more p.o. than she has been prior.  She denies any nausea or vomiting.  Reports that she is still having normal bowel movements and bladder habits.  She is very anxious concerning the upcoming surgery, but is overall ready.    She adamantly denies any new infectious signs of symptoms.     Past Medical/Surgical History:  No past medical history on file.    Past Surgical History:   Procedure Laterality Date    EXPLORATORY LAPAROTOMY      TONSILLECTOMY  1996    TUBAL LIGATION Bilateral        Family History   Problem Relation Age of Onset    Breast cancer Mother     Stroke Mother     Heart attack Mother     Diabetes Mother     Heart attack Father     Atrial fibrillation Father     Diabetes Father     Lymphoma Paternal Uncle     Heart attack Maternal Grandmother     Diabetes Paternal Grandmother     Heart attack Paternal Grandfather     Diabetes Paternal Grandfather        Social History     Tobacco Use    Smoking status: Never    Smokeless tobacco: Never   Vaping Use    Vaping Use: Never used   Substance and Sexual Activity    Alcohol use: Not Currently    Drug use: Never    Sexual activity: Not Currently       Current Medications:    Current Outpatient Medications:     acetaminophen (TYLENOL) 325 MG tablet, Take 2 tablets (650 mg total) by mouth every six (6) hours as needed for fever., Disp: , Rfl:     apixaban (ELIQUIS) 2.5 mg Tab, Take 1 tablet (2.5 mg total) by mouth two (2) times a day., Disp: , Rfl:     bisacodyl (DULCOLAX) 5 mg EC tablet, Take 4 tablets (20 mg total) by mouth once for 1 dose. Take with two 8 oz glasses of clear liquid at 9:00 am the day prior to surgery., Disp: 4 tablet, Rfl: 0    docusate sodium (COLACE) 100 MG capsule, Take 1 capsule (100 mg total) by mouth two (2) times a day., Disp: 60 capsule, Rfl: 0    famotidine (PEPCID) 10 MG tablet, Take 1 tablet (10 mg total) by mouth as needed for heartburn., Disp: , Rfl:     FLUoxetine (PROZAC) 10 MG capsule, Take 1 capsule (10 mg total) by mouth daily., Disp: 30 capsule, Rfl: 0    lidocaine-prilocaine (EMLA) 2.5-2.5 % cream, Apply 1 g topically as needed. (Patient not taking: Reported on 02/04/2022), Disp: , Rfl:     metoPROLOL succinate (TOPROL XL) 50 MG 24 hr  tablet, Take 1 tablet (50 mg total) by mouth daily., Disp: 30 tablet, Rfl: 11    metroNIDAZOLE (FLAGYL) 500 MG tablet, Take 1 tablet (500 mg total) by mouth Three (3) times a day for 1 day. Take one tablet at 2:00 pm, one tablet at 3:00 Pm and one at 10:00 pm the day prior to surgery for bowel prep, Disp: 3 tablet, Rfl: 0    multivitamins, therapeutic with minerals 9 mg iron-400 mcg tablet, Take 1 tablet by mouth daily., Disp: 130 tablet, Rfl: 1    neomycin (MYCIFRADIN) 500 mg tablet, Take 2 tablets (1,000 mg total) by mouth in the morning and 2 tablets (1,000 mg total) at noon and 2 tablets (1,000 mg total) in the evening. Do all this for 3 doses. Take at 2:00 pm, 3:00 pm, and 10:00 pm the day prior to surgery.., Disp: 6 tablet, Rfl: 0    ondansetron (ZOFRAN-ODT) 8 MG disintegrating tablet, Dissolve 1 tablet (8 mg total) by mouth every eight (8) hours as needed., Disp: 60 tablet, Rfl: 0    pegfilgrastim (NEULASTA) 6 mg/0.26mL injection, Inject 0.6 mL (6 mg total) under the skin every twenty-one (21) days. Administer 24-48 hours after completion of inpatient chemotherapy., Disp: 0.6 mL, Rfl: 1    polyethylene glycol (GLYCOLAX) 17 gram/dose powder, Mix (17 g) in 4-8 ounces of liquid and drink it by mouth daily. (Patient not taking: Reported on 02/04/2022), Disp: 476 g, Rfl: 0    polyethylene glycol (MIRALAX) 17 gram packet, Take 238 g by mouth once for 1 dose. Mix in 64oz of Gatorade. Drink 8oz of mixture q15 min until gone starting at 11am day prior to surgery., Disp: 14 packet, Rfl: 0    prochlorperazine (COMPAZINE) 10 MG tablet, Take 1 tablet (10 mg total) by mouth every six (6) hours as needed (breakthrough nausea or vomiting) for up to 8 days., Disp: 30 tablet, Rfl: 0    simethicone (MYLICON) 80 MG chewable tablet, Chew 1 tablet (80 mg total) every six (6) hours as needed., Disp: 100 tablet, Rfl: 0    Review of Symptoms:  Complete 10-system review is negative except as above in Interval History.    Physical Exam:  BP 100/63  - Pulse 101  - Temp 36.7 ??C (98.1 ??F) (Temporal)  - Resp 20  - Wt 49.4 kg (109 lb)  - SpO2 97%  - BMI 22.02 kg/m??   General: Alert, oriented, no acute distress.  HEENT: Normocephalic, atraumatic. Neck symmetric without masses. Sclera anicteric. Posterior oropharynx clear.  Chest: Normal work of breathing. Clear to auscultation bilaterally.  No wheezing or crackles. Good air movement throughout   Cardiovascular: Slightly tachycardic to 103, regular rhythm, no murmurs.  Abdomen: Distended with known large tumor bur  Extremities: Grossly normal range of motion.  Warm, well perfused.  No edema bilaterally.  Skin: No rashes or lesions noted.  Lymphatics: No cervical, supraclavicular, or inguinal adenopathy.

## 2022-01-31 ENCOUNTER — Ambulatory Visit: Payer: 59 | Admitting: Hematology and Oncology

## 2022-01-31 ENCOUNTER — Ambulatory Visit: Payer: 59

## 2022-01-31 ENCOUNTER — Other Ambulatory Visit: Payer: 59

## 2022-02-01 ENCOUNTER — Other Ambulatory Visit (HOSPITAL_COMMUNITY): Payer: Self-pay

## 2022-02-01 ENCOUNTER — Ambulatory Visit: Payer: 59

## 2022-02-01 MED ORDER — METOPROLOL SUCCINATE ER 50 MG TABLET,EXTENDED RELEASE 24 HR
ORAL_TABLET | Freq: Every day | ORAL | 11 refills | 30 days | Status: CP
Start: 2022-02-01 — End: 2023-02-01

## 2022-02-01 MED ORDER — METOPROLOL SUCCINATE ER 50 MG PO TB24
50.0000 mg | ORAL_TABLET | Freq: Every day | ORAL | 11 refills | Status: DC
  Filled 2022-02-01: qty 30, 30d supply, fill #0

## 2022-02-02 ENCOUNTER — Ambulatory Visit: Payer: 59

## 2022-02-03 ENCOUNTER — Ambulatory Visit: Payer: 59

## 2022-02-04 ENCOUNTER — Ambulatory Visit: Admit: 2022-02-04 | Discharge: 2022-02-05 | Payer: PRIVATE HEALTH INSURANCE

## 2022-02-04 ENCOUNTER — Ambulatory Visit: Payer: 59

## 2022-02-04 ENCOUNTER — Other Ambulatory Visit (HOSPITAL_COMMUNITY): Payer: Self-pay

## 2022-02-04 DIAGNOSIS — C569 Malignant neoplasm of unspecified ovary: Principal | ICD-10-CM

## 2022-02-04 DIAGNOSIS — C801 Malignant (primary) neoplasm, unspecified: Principal | ICD-10-CM

## 2022-02-04 DIAGNOSIS — R000 Tachycardia, unspecified: Principal | ICD-10-CM

## 2022-02-04 LAB — COMPREHENSIVE METABOLIC PANEL
ALBUMIN: 2.1 g/dL — ABNORMAL LOW (ref 3.4–5.0)
ALBUMIN: 2.3 g/dL — ABNORMAL LOW (ref 3.4–5.0)
ALKALINE PHOSPHATASE: 254 U/L — ABNORMAL HIGH (ref 46–116)
ALKALINE PHOSPHATASE: 271 U/L — ABNORMAL HIGH (ref 46–116)
ALT (SGPT): 26 U/L (ref 10–49)
ALT (SGPT): 28 U/L (ref 10–49)
ANION GAP: 6 mmol/L (ref 5–14)
ANION GAP: 6 mmol/L (ref 5–14)
AST (SGOT): 22 U/L (ref <=34)
AST (SGOT): 26 U/L (ref <=34)
BILIRUBIN TOTAL: 0.5 mg/dL (ref 0.3–1.2)
BILIRUBIN TOTAL: 0.5 mg/dL (ref 0.3–1.2)
BLOOD UREA NITROGEN: 10 mg/dL (ref 9–23)
BLOOD UREA NITROGEN: 9 mg/dL (ref 9–23)
BUN / CREAT RATIO: 20
BUN / CREAT RATIO: 22
CALCIUM: 8.8 mg/dL (ref 8.7–10.4)
CALCIUM: 9.4 mg/dL (ref 8.7–10.4)
CHLORIDE: 101 mmol/L (ref 98–107)
CHLORIDE: 99 mmol/L (ref 98–107)
CO2: 30 mmol/L (ref 20.0–31.0)
CO2: 31 mmol/L (ref 20.0–31.0)
CREATININE: 0.45 mg/dL — ABNORMAL LOW
CREATININE: 0.45 mg/dL — ABNORMAL LOW
EGFR CKD-EPI (2021) FEMALE: >90 mL/min/{1.73_m2} (ref >=60)
EGFR CKD-EPI (2021) FEMALE: >90 mL/min/{1.73_m2} (ref >=60)
GLUCOSE RANDOM: 92 mg/dL (ref 70–179)
GLUCOSE RANDOM: 92 mg/dL (ref 70–179)
POTASSIUM: 3.8 mmol/L (ref 3.4–4.8)
POTASSIUM: 4.2 mmol/L (ref 3.5–5.1)
PROTEIN TOTAL: 5.9 g/dL (ref 5.7–8.2)
PROTEIN TOTAL: 6.8 g/dL (ref 5.7–8.2)
SODIUM: 136 mmol/L (ref 135–145)
SODIUM: 137 mmol/L (ref 135–145)

## 2022-02-04 LAB — CBC W/ AUTO DIFF
BASOPHILS ABSOLUTE COUNT: 1 10*9/L — ABNORMAL HIGH (ref 0.0–0.1)
BASOPHILS RELATIVE PERCENT: 1.1 %
EOSINOPHILS ABSOLUTE COUNT: 0.3 10*9/L (ref 0.0–0.5)
EOSINOPHILS RELATIVE PERCENT: 0.3 %
HEMATOCRIT: 26.4 % — ABNORMAL LOW (ref 34.0–44.0)
HEMOGLOBIN: 8.7 g/dL — ABNORMAL LOW (ref 11.3–14.9)
LYMPHOCYTES ABSOLUTE COUNT: 2.2 10*9/L (ref 1.1–3.6)
LYMPHOCYTES RELATIVE PERCENT: 2.4 %
MEAN CORPUSCULAR HEMOGLOBIN CONC: 32.9 g/dL (ref 32.0–36.0)
MEAN CORPUSCULAR HEMOGLOBIN: 26.4 pg (ref 25.9–32.4)
MEAN CORPUSCULAR VOLUME: 80.4 fL (ref 77.6–95.7)
MEAN PLATELET VOLUME: 6.4 fL — ABNORMAL LOW (ref 6.8–10.7)
MONOCYTES ABSOLUTE COUNT: 5.8 10*9/L — ABNORMAL HIGH (ref 0.3–0.8)
MONOCYTES RELATIVE PERCENT: 6.3 %
NEUTROPHILS ABSOLUTE COUNT: 82.6 10*9/L — ABNORMAL HIGH (ref 1.8–7.8)
NEUTROPHILS RELATIVE PERCENT: 89.9 %
PLATELET COUNT: 701 10*9/L — ABNORMAL HIGH (ref 150–450)
RED BLOOD CELL COUNT: 3.28 10*12/L — ABNORMAL LOW (ref 3.95–5.13)
RED CELL DISTRIBUTION WIDTH: 18.9 % — ABNORMAL HIGH (ref 12.2–15.2)
WBC ADJUSTED: 91.9 10*9/L (ref 3.6–11.2)

## 2022-02-04 LAB — SLIDE REVIEW

## 2022-02-04 LAB — CA 125: CA 125: 469 U/mL — ABNORMAL HIGH (ref 0–35)

## 2022-02-04 LAB — TSH: THYROID STIMULATING HORMONE: 2.657 u[IU]/mL (ref 0.550–4.780)

## 2022-02-04 MED ORDER — NEOMYCIN 500 MG TABLET
ORAL_TABLET | Freq: Three times a day (TID) | ORAL | 0 refills | 1 days | Status: CP
Start: 2022-02-04 — End: 2022-02-05

## 2022-02-04 MED ORDER — METRONIDAZOLE 500 MG TABLET
ORAL_TABLET | Freq: Three times a day (TID) | ORAL | 0 refills | 1 days | Status: CP
Start: 2022-02-04 — End: 2022-02-05

## 2022-02-04 MED ORDER — BISACODYL 5 MG TABLET,DELAYED RELEASE
ORAL_TABLET | Freq: Once | ORAL | 0 refills | 1 days | Status: CP
Start: 2022-02-04 — End: 2022-02-04

## 2022-02-04 MED ORDER — POLYETHYLENE GLYCOL 3350 17 GRAM ORAL POWDER PACKET
PACK | Freq: Once | ORAL | 0 refills | 1 days | Status: CP
Start: 2022-02-04 — End: 2022-02-04

## 2022-02-04 MED ORDER — METRONIDAZOLE 500 MG PO TABS
ORAL_TABLET | ORAL | 0 refills | Status: DC
  Filled 2022-02-04: qty 3, 1d supply, fill #0

## 2022-02-04 MED ORDER — POLYETHYLENE GLYCOL 3350 17 G PO PACK: PACK | ORAL | 0 refills | Status: DC

## 2022-02-04 MED ORDER — BISACODYL 5 MG PO TBEC: DELAYED_RELEASE_TABLET | ORAL | 0 refills | Status: DC

## 2022-02-04 MED ORDER — NEOMYCIN SULFATE 500 MG PO TABS
1000.0000 mg | ORAL_TABLET | ORAL | 0 refills | Status: DC
  Filled 2022-02-04: qty 6, 1d supply, fill #0

## 2022-02-04 NOTE — Unmapped (Unsigned)
Patient was screened high risk for falls, the following steps were taken to reduce the risk of falls:    - Patient oriented to environment  - Patient advised to wait to climb onto exam/procedure table/chair until clinic staff could assist  - Physical environment was clear of slip/trip hazards  - Footwear observed to ensure it was appropriate, if not, patient advised on importance of well fitted, closed toe, sturdy shoes with non-skid soles  - Instructed patient and/or caregiver that patient is at risk for falls and encouraged requesting assistance before standing, ambulating or transferring  - ???Preventing falls: Care instructions??? placed in patient AVS

## 2022-02-04 NOTE — Unmapped (Signed)
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 Dore is a 51 yo otherwise healthy female who was in her usual state of health when she noticed some urinary frequency over the summer and then in September felt a pelvic mass growing. She had rapid abdominal discomfort that developed in September. Prior to this, the patient has intentional 20 pounds weight loss gradually over the past year  She developed abnormal heavy menstruation earlier this month was seen by PCP and then GYN. She was noted to have a very large central abdominal mass. CT on 12/02/21 noting a 21.1 by 10.9 by 18.7 cm abdominopelvic mass, with a satellite mass in the right upper quadrant anteriorly, and moderate ascites with tumor deposits along the pelvic ascites posteriorly.     She complains of early satiety, shortness of breath on minimal exertion and diffuse abdominal discomfort. She denies problems with urination or bowel movement.  She has frequent symptoms of heartburn, tachycardia and shortness of breath.    Endoscopy: She has never had a colonoscopy    Pathology:  12/08/21 OSH Pathology  FINAL MICROSCOPIC DIAGNOSIS:     A. 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    - See comment.     COMMENT:   The needle core biopsies show a heterogeneous combination of elements   including abundant neural tissue with atypical and immature features.   There are also atypical glandular epithelial structures and focal yolk   sac elements with microcystic pattern.  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  Immunohistochemistry is performed for cytokeratin   AE1/AE3, CD117, CD30, G FAP, glypican-3, OCT3/4, synaptophysin, S100 and   CD30.     Tumor Markers:  Initial 12/02/21  AFP was 42,699   CA-125 is elevated at 285, [place] [address], beta-hCG is elevated at 116   01/09/22 OSH labs    Component  Ref Range & Units 01/09/22 1409     AFP-Tumor Marker  <=8 ng/mL 8,546 High      Component  Ref Range & Units 01/09/22 0510     hCG Quantitative  mIU/mL 13.7       Allergies:  Latex, Sulfa (sulfonamide antibiotics), and Sulfamethoxazole-trimethoprim    Medications:   Current Outpatient Medications   Medication Sig Dispense Refill    acetaminophen (TYLENOL) 325 MG tablet Take 2 tablets (650 mg total) by mouth every six (6) hours as needed for fever.      apixaban (ELIQUIS) 2.5 mg Tab Take 1 tablet (2.5 mg total) by mouth two (2) times a day.      docusate sodium (COLACE) 100 MG capsule Take 1 capsule (100 mg total) by mouth two (2) times a day. 60 capsule 0    famotidine (PEPCID) 10 MG tablet  Take 1 tablet (10 mg total) by mouth as needed for heartburn.      FLUoxetine (PROZAC) 10 MG capsule Take 1 capsule (10 mg total) by mouth daily. 30 capsule 0    metoPROLOL succinate (TOPROL XL) 50 MG 24 hr tablet Take 1 tablet (50 mg total) by mouth daily. 30 tablet 11    multivitamins, therapeutic with minerals 9 mg iron-400 mcg tablet Take 1 tablet by mouth daily. 130 tablet 1    ondansetron (ZOFRAN-ODT) 8 MG disintegrating tablet Dissolve 1 tablet (8 mg total) by mouth every eight (8) hours as needed. 60 tablet 0    pegfilgrastim (NEULASTA) 6 mg/0.96mL injection Inject 0.6 mL (6 mg total) under the skin every twenty-one (21) days. Administer 24-48 hours after completion of inpatient chemotherapy. 0.6 mL 1    prochlorperazine (COMPAZINE) 10 MG tablet Take 1 tablet (10 mg total) by mouth every six (6) hours as needed (breakthrough nausea or vomiting) for up to 8 days. 30 tablet 0    simethicone (MYLICON) 80 MG chewable tablet Chew 1 tablet (80 mg total) every six (6) hours as needed. 100 tablet 0    lidocaine-prilocaine (EMLA) 2.5-2.5 % cream Apply 1 g topically as needed. (Patient not taking: Reported on 02/04/2022)      polyethylene glycol (GLYCOLAX) 17 gram/dose powder Mix (17 g) in 4-8 ounces of liquid and drink it by mouth daily. (Patient not taking: Reported on 02/04/2022) 476 g 0     No current facility-administered medications for this visit.       Past Medical History:  No past medical history on file.    Past Surgical History:  Past Surgical History:   Procedure Laterality Date    EXPLORATORY LAPAROTOMY      TONSILLECTOMY  1996    TUBAL LIGATION Bilateral        Family History:  The patient's family history includes Atrial fibrillation in her father; Breast cancer in her mother; Diabetes in her father, mother, paternal grandfather, and paternal grandmother; Heart attack in her father, maternal grandmother, mother, and paternal grandfather; Lymphoma in her paternal uncle; Stroke in her mother..    Social History:  Tobacco use: denies  Alcohol use: denies  Drug use: denies  Occupation:  Horticulturist, commercial at [employer] Status: ECOG 0    Review of Systems:  A 12 system review of systems was negative except as noted in HPI    Vital Signs  BP 115/65  - Pulse 113  - Temp 37.2 ??C (99 ??F) (Temporal)  - Resp 16  - Ht 149.9 cm (4' 11)  - Wt 49.5 kg (109 lb 1.6 oz)  - SpO2 96%  - BMI 22.04 kg/m??   Wt Readings from Last 3 Encounters:   02/04/22 49.5 kg (109 lb 1.6 oz)   01/27/22 49.3 kg (108 lb 9.6 oz)   01/25/22 48.6 kg (107 lb 3.2 oz)        Physical Exam:  General Appearance:  No acute distress, well appearing and well nourished.   Head:  Normocephalic, atraumatic.   Eyes:  Conjuctiva and lids appear normal. Pupils equal and round, sclera anicteric.   Ears:  Overall appearance normal with no scars, lesions or masses.  Hearing is grossly normal.   Nose: Nares grossly normal, no drainage.   Throat: Lips, mucosa, and tongue normal; teeth and gums normal.   Neck: Supple, symmetrical, trachea midline, no adenopathy;     thyroid:  no enlargement/tenderness/nodules.  Pulmonary:    Normal respiratory effort.  Lungs were clear to auscultation bilaterally.   Cardiovascular:  Regular rate and rhythm, no murmur noted.  No thrill noted.   Abdomen:   Soft - protuberant - appears to be in 3rd trimester of pregnancy, tender, with large palpable pelvic mass at and above umbilicus.  No hepatosplenomegaly.  No hernias appreciated.   Musculoskeletal: Normal gait.  Extremities without clubbing, cyanosis, or edema.   Skin: Skin color, texture, turgor normal, no rashes or lesions.   Neurologic: No motor abnormalities noted.  Sensation grossly intact.   Psychiatric: Judgement and insight appropriate.  Oriented to person, place, and time.       Imaging: Radiology studies were personally reviewed  12/02/21 MRI  IMPRESSION:   1. 21.1 by 10.9 by 18.7 cm abdominopelvic mass, with a satellite   mass in the right upper quadrant anteriorly, and moderate ascites   with tumor deposits along the pelvic ascites posteriorly. The mass   could be arising from right or left ovary, or both. The mass   displaces the bowel and IVC, but there is no IVC thrombus or pelvic   DVT identified. Appearance strongly favors ovarian malignancy with   peritoneal spread of tumor.   2. Additional satellite mass above the liver is partially included,   along with tumor deposits along the ascites.   3. Low-level edema along the lateral abdominal wall musculature.   4. Flattening of the IVC at the level of the aortic bifurcation due   to the large abdominopelvic tumor. No findings of pelvic DVT.       01/27/22 CTA  Impression      No pulmonary embolism.     01/03/22 CT   FINDINGS:   Lower chest: Unremarkable   Hepatobiliary: Heterogeneous likely subcapsular mass in the dome of   the liver primarily of the right hepatic lobe with speckled internal   calcifications, measuring 10.8 by 5.3 by 8.7 cm (volume = 260   cm^3), previously 9.6 by 5.2 by 8.8 cm (volume = 230 cm^3) on   12/07/2021. Lobularity along the margin with the hepatic parenchyma   suggests hepatic invasion.   Contracted gallbladder. Perihepatic ascites noted. No new hepatic   lesion observed. No biliary dilatation.   Pancreas: Unremarkable   Spleen: Unremarkable   Adrenals/Urinary Tract: Suspected 2 mm left kidney lower pole   nonobstructive renal calculus, image 33 series 2. Adrenal glands   unremarkable. No hydronephrosis. Urinary bladder unremarkable.   Stomach/Bowel: Displacement of bowel loops by the large central   abdominopelvic tumor. No current dilated bowel.   Vascular/Lymphatic: Mild abdominal aortic atherosclerotic vascular   disease.   Reproductive: Tracking above and around the uterus and filling a   substantial portion of the abdominopelvic cavity, we demonstrate a   24.8 by 15.1 by 17.6 cm (volume = 3450 cm^3) heterogeneous mass   with scattered calcifications compatible with malignancy. Prior CT   abdomen did not include the pelvis and accordingly size wise this is   difficult to compare. However, the abdominal component is similar to   previous. This may be arising from the ovaries with slight   eccentricity to the right in the pelvis.   Other: Scattered malignant ascites. A cystic and solid right omental   deposit measures 9.3 by 4.3 cm on image 47 series 2, formerly 8.4 by   3.7 cm.   There stranding and some mild nodularity along the rest of the   omentum. Perihepatic and  perisplenic ascites noted along with   ascites tracking in the paracolic gutter and in the pelvis.   Musculoskeletal: Unremarkable     IMPRESSION:   1. Large central abdominopelvic mass compatible with malignancy,   3450 cubic cm in volume. This may be arising from the ovaries with   slight eccentricity to the right in the pelvis.   2. Mild increase in size of the right omental tumor deposit.   3. Mild increase in size of the subcapsular mass along the dome of   the right hepatic lobe, with suspected hepatic invasion.   4. Scattered malignant ascites.   5. No dilated bowel to suggest obstruction.   6. 2 mm left kidney lower pole nonobstructive renal calculus.   7. Mild abdominal aortic atherosclerotic vascular disease.       Assessment & Plan:  51 yo female otherwise healthy with recent diagnosis of ovarian cancer - malignant germ cell  Lab work today at [facility] appointment  Bowel prep  ERAS  Nutrition consult placed

## 2022-02-04 NOTE — Unmapped (Signed)
[facility]    FULL BOWEL PREP       ONE WEEK PRIOR TO SURGERY:     DISCONTINUE ???Blood Thinners???: Coumadin, Plavix, Eliquis, Aspirin  DISCONTINUE dietary and herbal supplements                                                                                                                                                     ONE DAY PRIOR TO SURGERY:  BEGIN LIQUID DIET (ONLY things you can SEE through, no milk or cream or items with solid components)    Examples  Clear juices (apple, cranberry, grape), strained citrus juices or fruit punch  Tea or coffee (NO cream or milk)  Soft drinks or sports drinks (Ginger Ale, Sprite, Gatorade)  Chicken or Beef bouillon/broth  Plain popsicles (Without fruit)  Flavored gelatin (Jell-O) (Without fruit)      TAKE BOWEL PREP AS PRESCRIBED    Bisacodyl (DULCOLAX) 5 mg EC Tablet  Take 4 tablet (20 mg total) by mouth once for 1 dose.  Take with two (2) eight (8) oz glasses of clear liquid at 09:00 am the day before to surgery.     Polyethylene glycol (MIRALAX) 17 gram  Mix 238 grams of Miralax powder in 64 oz of Gatorade.  Drink 8 oz of mixture every 15 minutes until gone.  Start at 11am the day before surgery.      TAKE ANTIBIOTICS PRESCRIBED    Metronidazole (Flagyl) 500 MG tablet   Take 1 tablets by mouth three (3) times a day.  Take at 2:00 pm, 3:00 pm, and 10:00 pm the day prior to surgery      Neomycin (MYCIFRADIN) 500 MG tablet  Take 2 tablets (1,000) by mouth three (3) times a day.  Take at 2:00 pm, 3:00 pm, and 10:00 pm the day prior to surgery      NO food after midnight the night before surgery.  This includes gum and candy.   No alcoholic beverages or tobacco products should be used 24 hours prior to surgery.     You may have the following clear liquids up to TWO hours before their scheduled ARRIVAL time:    Water, Apple Juice, Pedialyte, Gatorade, Sodas (Coke, etc.), Black Coffee (no milk/creamer)    Please drink Ensure Pre Surgery one hour before your scheduled arrival time.     One week before your surgery date, begin using your incentive spirometer.  Directions for use are inside the front cover of the booklet provided.  Remember to record the number in your booklet after inhale on the mouth piece of your incentive spirometer.  You'll practice this 30 times a day. Bring your device and your booklet to the hospital on the day of surgery.          **The precare office will call you  on this day to tell you what time to report for surgery. **          (If you have not heard from precare by 4:00 pm please call [phone])

## 2022-02-04 NOTE — Unmapped (Signed)
[facility] Specialty Medication Onboarding    Specialty Medication: Neulasta  Prior Authorization: Not Required   Financial Assistance: Yes - copay card approved as secondary   Final Copay/Day Supply: $0 / 21    Insurance Restrictions: None     Notes to Pharmacist:     The triage team has completed the benefits investigation and has determined that the patient is able to fill this medication at [facility]. Please contact the patient to complete the onboarding or follow up with the prescribing physician as needed.

## 2022-02-04 NOTE — Unmapped (Signed)
It was a pleasure to see you today! Thank you for choosing [facility].    Please see your handouts for more detailed information regarding your upcoming surgery.    If you have any questions or concerns, please do not hesitate to contact us.       Business Hours:  Physician Nurse Clinician Phone Number   Dr. Albright  Hopkins: Jessy Alvarado-Ramos, RN  [place]: Katina Turner, RN [phone]  [phone], select option 3   Dr. Bae-Jump Melissa Sartwell, RN [phone]   Dr. Boggess Franquez: Lyn Filip, RN   [place]: Kristin Knott, RN [phone]  [phone], select option 3   Dr. Clark Stan[place]: Paru Patel, RN  [place]: Kristin Knott, RN or   Katina Turner, RN [phone]  [phone], select option 3   Dr. Dessources [place]: Jessy Alvarado-Ramos, RN  [place]: Katina Turner, RN [phone]  [phone], select option 3   Dr. Lara Clanton: Tara Hussey, RN  [place]: Katina Turner, RN or  Kristin Knott, RN [phone]  [phone], select option 3   Dr. Newton Paru Patel, RN [phone]   Dr. Soper Melissa Sartwell, RN [phone]   Dr. Tucker Lyn Filip, RN [phone]   Dr. Van Le Lyn Filip, RN [phone]   [phone] is the [facility] call center; once connected you may ask for your specific Nurse Navigator/clinician.     Scheduling: [phone]  Fax: [phone]    After hours and on weekends:  [phone]; ask for the resident on call for Gyn Oncology

## 2022-02-04 NOTE — Unmapped (Signed)
[facility] received a prescription for medication Neulasta for patient.  The prescription is for benefits investigation purposes only.  Clinic has been notified of the approved copay.  [facility] will profile the prescription at this time.  We will not onboard/schedule delivery until notified by the provider/clinic to proceed.    Prescription was sent when she was in hospital.  She is having surgery in Jan and does not anticipate needing Neulasta.  Either her or clinic will let [facility] know when to onboard.

## 2022-02-04 NOTE — Unmapped (Signed)
02/04/22: Initial pre-op consult with Dr. Vanessa Ralphs. Plan for CRS/HIPEC, joint case with Gyn-Onc. Consent signed for surgery today, OR date already scheduled for 02/15/22.   Added Surg Onc providers to patient's care team and provided patient with our contact information.     Reviewed bowel prep instructions with patient and antibiotics. Reviewed basic diet instructions - Clear day before surgery, absolutely nothing to eat after midnight. Reviewed basics of ERAS protocol, including when to drink pre-op clear Ensure drink. Provided surgical logistics information - where to check in, pre-care will call with arrival time, ask pre care what medicines to take the morning of surgery.     Patient does take a blood thinner. She will review with GYN-ONC pre-op appointment when to stop the blood thinner.       [facility]    FULL BOWEL PREP       ONE WEEK PRIOR TO SURGERY:     DISCONTINUE ???Blood Thinners???: Coumadin, Plavix, Eliquis, Aspirin  DISCONTINUE dietary and herbal supplements                                                                                                                                          ONE DAY PRIOR TO SURGERY:  BEGIN LIQUID DIET (ONLY things you can SEE through, no milk or cream or items with solid components)    Examples  Clear juices (apple, cranberry, grape), strained citrus juices or fruit punch  Tea or coffee (NO cream or milk)  Soft drinks or sports drinks (Ginger Ale, Sprite, Gatorade)  Chicken or Beef bouillon/broth  Plain popsicles (Without fruit)  Flavored gelatin (Jell-O) (Without fruit)      TAKE BOWEL PREP AS PRESCRIBED    Bisacodyl (DULCOLAX) 5 mg EC Tablet  Take 4 tablet (20 mg total) by mouth once for 1 dose.  Take with two (2) eight (8) oz glasses of clear liquid at 09:00 am the day before to surgery.     Polyethylene glycol (MIRALAX) 17 gram  Mix 238 grams of Miralax powder in 64 oz of Gatorade.  Drink 8 oz of mixture every 15 minutes until gone.  Start at 11am the day before surgery.      TAKE ANTIBIOTICS PRESCRIBED  Metronidazole (Flagyl) 500 MG tablet   Take 1 tablets by mouth three (3) times a day.  Take at 2:00 pm, 3:00 pm, and 10:00 pm the day prior to surgery    Neomycin (MYCIFRADIN) 500 MG tablet  Take 2 tablets (1,000) by mouth three (3) times a day.  Take at 2:00 pm, 3:00 pm, and 10:00 pm the day prior to surgery    NO food after midnight the night before surgery.  This includes gum and candy.   No alcoholic beverages or tobacco products should be used 24 hours prior to surgery.     You may have the following clear liquids up to TWO  hours before their scheduled ARRIVAL time:   Water, Apple Juice, Pedialyte, Gatorade, Sodas (Coke, etc.), Black Coffee (no milk/creamer)    Please drink Ensure Pre Surgery one hour before your scheduled arrival time.     One week before your surgery date, begin using your incentive spirometer.  Directions for use are inside the front cover of the booklet provided.  Remember to record the number in your booklet after inhale on the mouth piece of your incentive spirometer.  You'll practice this 30 times a day. Bring your device and your booklet to the hospital on the day of surgery.      **The precare office will call you on this day to tell you what time to report for surgery. **          (If you have not heard from precare by 4:00 pm please call [phone])

## 2022-02-09 ENCOUNTER — Ambulatory Visit (HOSPITAL_COMMUNITY)
Admission: RE | Admit: 2022-02-09 | Discharge: 2022-02-09 | Disposition: A | Discharge status: Home / Self Care | Payer: 59 | Source: Ambulatory Visit | Attending: Gynecologic Oncology | Admitting: Gynecologic Oncology

## 2022-02-09 ENCOUNTER — Other Ambulatory Visit: Payer: Self-pay | Admitting: Gynecologic Oncology

## 2022-02-09 ENCOUNTER — Telehealth: Payer: Self-pay | Admitting: *Deleted

## 2022-02-09 DIAGNOSIS — C569 Malignant neoplasm of unspecified ovary: Secondary | ICD-10-CM | POA: Insufficient documentation

## 2022-02-09 MED ORDER — HEPARIN SOD (PORK) LOCK FLUSH 100 UNIT/ML IV SOLN
INTRAVENOUS | Status: AC
  Filled 2022-02-09: qty 5

## 2022-02-09 MED ORDER — HEPARIN SOD (PORK) LOCK FLUSH 100 UNIT/ML IV SOLN
500.0000 [IU] | Freq: Once | INTRAVENOUS | Status: AC
  Administered 2022-02-09: 500 [IU] via INTRAVENOUS

## 2022-02-09 MED ORDER — IOHEXOL 300 MG/ML  SOLN
80.0000 mL | Freq: Once | INTRAMUSCULAR | Status: AC | PRN
  Administered 2022-02-09: 80 mL via INTRAVENOUS

## 2022-02-09 MED ORDER — IOHEXOL 9 MG/ML PO SOLN: 1000.0000 mL | Freq: Once | ORAL | Status: DC

## 2022-02-09 NOTE — Telephone Encounter (Signed)
Per Dr Berline Lopes patient scheduled for a lab appt tomorrow

## 2022-02-10 ENCOUNTER — Telehealth: Payer: Self-pay

## 2022-02-10 ENCOUNTER — Inpatient Hospital Stay: Payer: 59

## 2022-02-10 ENCOUNTER — Other Ambulatory Visit: Payer: Self-pay

## 2022-02-10 DIAGNOSIS — D649 Anemia, unspecified: Secondary | ICD-10-CM | POA: Diagnosis not present

## 2022-02-10 DIAGNOSIS — D61818 Other pancytopenia: Secondary | ICD-10-CM

## 2022-02-10 DIAGNOSIS — C569 Malignant neoplasm of unspecified ovary: Secondary | ICD-10-CM

## 2022-02-10 LAB — CBC WITH DIFFERENTIAL (CANCER CENTER ONLY)
Abs Immature Granulocytes: 11.93 10*3/uL — ABNORMAL HIGH (ref 0.00–0.07)
Basophils Absolute: 0.1 10*3/uL (ref 0.0–0.1)
Basophils Relative: 0 %
Eosinophils Absolute: 0 10*3/uL (ref 0.0–0.5)
Eosinophils Relative: 0 %
HCT: 23.4 % — ABNORMAL LOW (ref 36.0–46.0)
Hemoglobin: 7.7 g/dL — ABNORMAL LOW (ref 12.0–15.0)
Immature Granulocytes: 18 %
Lymphocytes Relative: 3 %
Lymphs Abs: 1.9 10*3/uL (ref 0.7–4.0)
MCH: 27 pg (ref 26.0–34.0)
MCHC: 32.9 g/dL (ref 30.0–36.0)
MCV: 82.1 fL (ref 80.0–100.0)
Monocytes Absolute: 4.1 10*3/uL — ABNORMAL HIGH (ref 0.1–1.0)
Monocytes Relative: 6 %
Neutro Abs: 48.7 10*3/uL — ABNORMAL HIGH (ref 1.7–7.7)
Neutrophils Relative %: 73 %
Platelet Count: 570 10*3/uL — ABNORMAL HIGH (ref 150–400)
RBC: 2.85 MIL/uL — ABNORMAL LOW (ref 3.87–5.11)
RDW: 18.5 % — ABNORMAL HIGH (ref 11.5–15.5)
WBC Count: 66.8 10*3/uL (ref 4.0–10.5)
nRBC: 0.2 % (ref 0.0–0.2)

## 2022-02-10 LAB — CMP (CANCER CENTER ONLY)
ALT: 27 U/L (ref 0–44)
AST: 15 U/L (ref 15–41)
Albumin: 2.8 g/dL — ABNORMAL LOW (ref 3.5–5.0)
Alkaline Phosphatase: 177 U/L — ABNORMAL HIGH (ref 38–126)
Anion gap: 8 (ref 5–15)
BUN: 9 mg/dL (ref 6–20)
CO2: 30 mmol/L (ref 22–32)
Calcium: 9.2 mg/dL (ref 8.9–10.3)
Chloride: 97 mmol/L — ABNORMAL LOW (ref 98–111)
Creatinine: 0.42 mg/dL — ABNORMAL LOW (ref 0.44–1.00)
GFR, Estimated: >60 mL/min (ref >60)
Glucose, Bld: 153 mg/dL — ABNORMAL HIGH (ref 70–99)
Potassium: 4.1 mmol/L (ref 3.5–5.1)
Sodium: 135 mmol/L (ref 135–145)
Total Bilirubin: 0.5 mg/dL (ref 0.3–1.2)
Total Protein: 6.5 g/dL (ref 6.5–8.1)

## 2022-02-10 LAB — SAMPLE TO BLOOD BANK

## 2022-02-10 NOTE — Telephone Encounter (Signed)
CRITICAL VALUE STICKER  CRITICAL VALUE: WBC 66.8  RECEIVER (on-site recipient of call): Rogers NOTIFIED: 02/10/22 10:55 AM  MESSENGER (representative from lab): Pam  MD NOTIFIED: Dr. Berline Lopes   TIME OF NOTIFICATION:10:58  RESPONSE:  Dr. Berline Lopes acknowledged receipt of lab value.

## 2022-02-11 ENCOUNTER — Ambulatory Visit: Admit: 2022-02-11 | Discharge: 2022-02-12 | Payer: PRIVATE HEALTH INSURANCE

## 2022-02-11 ENCOUNTER — Telehealth: Payer: Self-pay | Admitting: Gynecologic Oncology

## 2022-02-11 ENCOUNTER — Other Ambulatory Visit: Payer: Self-pay

## 2022-02-11 ENCOUNTER — Other Ambulatory Visit: Payer: Self-pay | Admitting: Gynecologic Oncology

## 2022-02-11 DIAGNOSIS — C569 Malignant neoplasm of unspecified ovary: Secondary | ICD-10-CM

## 2022-02-11 DIAGNOSIS — D649 Anemia, unspecified: Secondary | ICD-10-CM

## 2022-02-11 LAB — BETA HCG QUANT (REF LAB): hCG Quant: <1 m[IU]/mL

## 2022-02-11 LAB — PREPARE RBC (CROSSMATCH)

## 2022-02-11 NOTE — Unmapped (Signed)
Addended by: Tommie Raymond on: 02/10/2022 11:42 PM     Modules accepted: Level of Service

## 2022-02-11 NOTE — Telephone Encounter (Signed)
Called patient, discussed lab work from yesterday.  hCG has normalized.  If he is still pending.  Hemoglobin is 7.7.  In anticipation of large surgery with significant risk of blood loss, I recommended blood transfusion.  There is an opening in our infusion center tomorrow and patient amenable to coming in for blood transfusion.  Jeral Pinch MD Gynecologic Oncology

## 2022-02-11 NOTE — Progress Notes (Signed)
Blood transfusion of 1 unit PRBCs ordered for patient per Dr. Berline Lopes. Patient's Hgb 7.7 with upcoming surgery with significant blood loss expected.

## 2022-02-12 ENCOUNTER — Inpatient Hospital Stay: Payer: 59

## 2022-02-12 VITALS — BP 94/65 | HR 91 | Temp 98.5°F | Resp 16

## 2022-02-12 DIAGNOSIS — D649 Anemia, unspecified: Secondary | ICD-10-CM | POA: Diagnosis not present

## 2022-02-12 DIAGNOSIS — C569 Malignant neoplasm of unspecified ovary: Secondary | ICD-10-CM

## 2022-02-12 LAB — AFP TUMOR MARKER: AFP, Serum, Tumor Marker: 173 ng/mL — ABNORMAL HIGH (ref 0.0–6.4)

## 2022-02-12 MED ORDER — SODIUM CHLORIDE 0.9% FLUSH
10.0000 mL | Freq: Once | INTRAVENOUS | Status: AC
  Administered 2022-02-12: 10 mL

## 2022-02-12 MED ORDER — DIPHENHYDRAMINE HCL 25 MG PO CAPS
25.0000 mg | ORAL_CAPSULE | Freq: Once | ORAL | Status: AC
  Administered 2022-02-12: 25 mg via ORAL
  Filled 2022-02-12: qty 1

## 2022-02-12 MED ORDER — ACETAMINOPHEN 325 MG PO TABS
650.0000 mg | ORAL_TABLET | Freq: Once | ORAL | Status: AC
  Administered 2022-02-12: 650 mg via ORAL
  Filled 2022-02-12 (×2): qty 2

## 2022-02-12 MED ORDER — HEPARIN SOD (PORK) LOCK FLUSH 100 UNIT/ML IV SOLN
500.0000 [IU] | Freq: Once | INTRAVENOUS | Status: AC
  Administered 2022-02-12: 500 [IU]

## 2022-02-12 MED ORDER — SODIUM CHLORIDE 0.9% IV SOLUTION
250.0000 mL | Freq: Once | INTRAVENOUS | Status: AC
  Administered 2022-02-12: 250 mL via INTRAVENOUS

## 2022-02-12 NOTE — Patient Instructions (Signed)
Blood Transfusion, Adult A blood transfusion is a procedure in which you receive blood through an IV tube. You may need this procedure because of: A bleeding disorder. An illness. An injury. A surgery. The blood may come from someone else (a donor). You may also be able to donate blood for yourself before a surgery. The blood given in a transfusion may be made up of different types of cells. You may get: Red blood cells. These carry oxygen to the cells in the body. Platelets. These help your blood to clot. Plasma. This is the liquid part of your blood. It carries proteins and other substances through the body. White blood cells. These help you fight infections. If you have a clotting disorder, you may also get other types of blood products. Depending on the type of blood product, this procedure may take 1-4 hours to complete. Tell your doctor about: Any bleeding problems you have. Any reactions you have had during a blood transfusion in the past. Any allergies you have. All medicines you are taking, including vitamins, herbs, eye drops, creams, and over-the-counter medicines. Any surgeries you have had. Any medical conditions you have. Whether you are pregnant or may be pregnant. What are the risks? Talk with your health care provider about risks. The most common problems include: A mild allergic reaction. This includes red, swollen areas of skin (hives) and itching. Fever or chills. This may be the body's response to new blood cells received. This may happen during or up to 4 hours after the transfusion. More serious problems may include: A serious allergic reaction. This includes breathing trouble or swelling around the face and lips. Too much fluid in the lungs. This may cause breathing problems. Lung injury. This causes breathing trouble and low oxygen in the blood. This can happen within hours of the transfusion or days later. Too much iron. This can happen after getting many blood  transfusions over a period of time. An infection or virus passed through the blood. This is rare. Donated blood is carefully tested before it is given. Your body's defense system (immune system) trying to attack the new blood cells. This is rare. Symptoms may include fever, chills, nausea, low blood pressure, and low back or chest pain. Donated cells attacking healthy tissues. This is rare. What happens before the procedure? You will have a blood test to find out your blood type. The test also finds out what type of blood your body will accept and matches it to the donor type. If you are going to have a planned surgery, you may be able to donate your own blood. This may be done in case you need a transfusion. You will have your temperature, blood pressure, and pulse checked. You may receive medicine to help prevent an allergic reaction. This may be done if you have had a reaction to a transfusion before. This medicine may be given to you by mouth or through an IV tube. What happens during the procedure?  An IV tube will be put into one of your veins. The bag of blood will be attached to your IV tube. Then, the blood will enter through your vein. Your temperature, blood pressure, and pulse will be checked often. This is done to find early signs of a transfusion reaction. Tell your nurse right away if you have any of these symptoms: Shortness of breath or trouble breathing. Chest or back pain. Fever or chills. Red, swollen areas of skin or itching. If you have any signs   or symptoms of a reaction, your transfusion will be stopped. You may also be given medicine. When the transfusion is finished, your IV tube will be taken out. Pressure may be put on the IV site for a few minutes. A bandage (dressing) will be put on the IV site. The procedure may vary among doctors and hospitals. What happens after the procedure? You will be monitored until you leave the hospital or clinic. This includes  checking your temperature, blood pressure, pulse, breathing rate, and blood oxygen level. Your blood may be tested to see how you have responded to the transfusion. You may be warmed with fluids or blankets. This is done to keep the temperature of your body normal. If you have your procedure in an outpatient setting, you will be told whom to contact to report any reactions. Where to find more information Visit the [employer]: redcross.org Summary A blood transfusion is a procedure in which you receive blood through an IV tube. The blood you are given may be made up of different blood cells. You may receive red blood cells, platelets, plasma, or white blood cells. Your temperature, blood pressure, and pulse will be checked often. After the procedure, your blood may be tested to see how you have responded. This information is not intended to replace advice given to you by your health care provider. Make sure you discuss any questions you have with your health care provider. Document Revised: 04/30/2021 Document Reviewed: 04/30/2021 Elsevier Patient Education  2023 Elsevier Inc.  

## 2022-02-13 LAB — TYPE AND SCREEN
ABO/RH(D): B POS
Antibody Screen: NEGATIVE
Unit division: 0

## 2022-02-13 LAB — BPAM RBC
Blood Product Expiration Date: 202401222359
ISSUE DATE / TIME: 202312300823
Unit Type and Rh: 7300

## 2022-02-15 ENCOUNTER — Encounter: Admit: 2022-02-15 | Discharge: 2022-02-24 | Payer: PRIVATE HEALTH INSURANCE

## 2022-02-15 ENCOUNTER — Ambulatory Visit: Admit: 2022-02-15 | Payer: PRIVATE HEALTH INSURANCE

## 2022-02-15 ENCOUNTER — Ambulatory Visit
Admit: 2022-02-15 | Discharge: 2022-02-24 | Disposition: A | Discharge status: Home / Self Care | Payer: PRIVATE HEALTH INSURANCE

## 2022-02-15 ENCOUNTER — Encounter: Admit: 2022-02-15 | Payer: PRIVATE HEALTH INSURANCE

## 2022-02-15 HISTORY — PX: ILEOSTOMY: SHX1783

## 2022-02-15 HISTORY — PX: DEBULKING: SHX6277

## 2022-02-15 LAB — BLOOD GAS CRITICAL CARE PANEL, ARTERIAL
BASE EXCESS ARTERIAL: 2.2 — ABNORMAL HIGH (ref -2.0–2.0)
BASE EXCESS ARTERIAL: 0.4 (ref -2.0–2.0)
BASE EXCESS ARTERIAL: -0.4 (ref -2.0–2.0)
BASE EXCESS ARTERIAL: -0.3 (ref -2.0–2.0)
BASE EXCESS ARTERIAL: -3.5 — ABNORMAL LOW (ref -2.0–2.0)
BASE EXCESS ARTERIAL: -4.8 — ABNORMAL LOW (ref -2.0–2.0)
BASE EXCESS ARTERIAL: -7.4 — ABNORMAL LOW (ref -2.0–2.0)
CALCIUM IONIZED ARTERIAL (MG/DL): 4.17 mg/dL — ABNORMAL LOW (ref 4.40–5.40)
CALCIUM IONIZED ARTERIAL (MG/DL): 5.09 mg/dL (ref 4.40–5.40)
CALCIUM IONIZED ARTERIAL (MG/DL): 4.62 mg/dL (ref 4.40–5.40)
CALCIUM IONIZED ARTERIAL (MG/DL): 4.49 mg/dL (ref 4.40–5.40)
CALCIUM IONIZED ARTERIAL (MG/DL): 4.71 mg/dL (ref 4.40–5.40)
CALCIUM IONIZED ARTERIAL (MG/DL): 4.37 mg/dL — ABNORMAL LOW (ref 4.40–5.40)
CALCIUM IONIZED ARTERIAL (MG/DL): 4.83 mg/dL (ref 4.40–5.40)
FIO2 ARTERIAL: 40
FIO2 ARTERIAL: 40
FIO2 ARTERIAL: 40
FIO2 ARTERIAL: 40
FIO2 ARTERIAL: 40
FIO2 ARTERIAL: 40
GLUCOSE WHOLE BLOOD: 111 mg/dL (ref 70–179)
GLUCOSE WHOLE BLOOD: 129 mg/dL (ref 70–179)
GLUCOSE WHOLE BLOOD: 119 mg/dL (ref 70–179)
GLUCOSE WHOLE BLOOD: 145 mg/dL (ref 70–179)
GLUCOSE WHOLE BLOOD: 164 mg/dL (ref 70–179)
GLUCOSE WHOLE BLOOD: 181 mg/dL — ABNORMAL HIGH (ref 70–179)
GLUCOSE WHOLE BLOOD: 165 mg/dL (ref 70–179)
HCO3 ARTERIAL: 27 mmol/L (ref 22–27)
HCO3 ARTERIAL: 24 mmol/L (ref 22–27)
HCO3 ARTERIAL: 24 mmol/L (ref 22–27)
HCO3 ARTERIAL: 24 mmol/L (ref 22–27)
HCO3 ARTERIAL: 22 mmol/L (ref 22–27)
HCO3 ARTERIAL: 21 mmol/L — ABNORMAL LOW (ref 22–27)
HCO3 ARTERIAL: 17 mmol/L — ABNORMAL LOW (ref 22–27)
HEMOGLOBIN BLOOD GAS: 6.6 g/dL — ABNORMAL LOW
HEMOGLOBIN BLOOD GAS: 8.1 g/dL — ABNORMAL LOW
HEMOGLOBIN BLOOD GAS: 7.4 g/dL — ABNORMAL LOW (ref 12.00–16.00)
HEMOGLOBIN BLOOD GAS: 7 g/dL — ABNORMAL LOW (ref 12.00–16.00)
HEMOGLOBIN BLOOD GAS: 6.2 g/dL — ABNORMAL LOW
HEMOGLOBIN BLOOD GAS: 8.4 g/dL — ABNORMAL LOW
HEMOGLOBIN BLOOD GAS: 8.1 g/dL — ABNORMAL LOW
LACTATE BLOOD ARTERIAL: 1.7 mmol/L — ABNORMAL HIGH (ref <1.3)
LACTATE BLOOD ARTERIAL: 0.9 mmol/L (ref <1.3)
LACTATE BLOOD ARTERIAL: 0.8 mmol/L (ref <1.3)
LACTATE BLOOD ARTERIAL: 0.9 mmol/L (ref <1.3)
LACTATE BLOOD ARTERIAL: 1.1 mmol/L (ref <1.3)
LACTATE BLOOD ARTERIAL: 1.3 mmol/L — ABNORMAL HIGH (ref <1.3)
LACTATE BLOOD ARTERIAL: 1.9 mmol/L — ABNORMAL HIGH (ref <1.3)
O2 SATURATION ARTERIAL: 99.4 % (ref 94.0–100.0)
O2 SATURATION ARTERIAL: 99.3 % (ref 94.0–100.0)
O2 SATURATION ARTERIAL: 99.7 % (ref 94.0–100.0)
O2 SATURATION ARTERIAL: 99.7 % (ref 94.0–100.0)
O2 SATURATION ARTERIAL: 99.1 % (ref 94.0–100.0)
O2 SATURATION ARTERIAL: 99 % (ref 94.0–100.0)
O2 SATURATION ARTERIAL: 96 % (ref 94.0–100.0)
PCO2 ARTERIAL: 31.5 mmHg — ABNORMAL LOW (ref 35.0–45.0)
PCO2 ARTERIAL: 35.6 mmHg (ref 35.0–45.0)
PCO2 ARTERIAL: 37.6 mmHg (ref 35.0–45.0)
PCO2 ARTERIAL: 34 mmHg — ABNORMAL LOW (ref 35.0–45.0)
PCO2 ARTERIAL: 36.7 mmHg (ref 35.0–45.0)
PCO2 ARTERIAL: 34.4 mmHg — ABNORMAL LOW (ref 35.0–45.0)
PCO2 ARTERIAL: 33.2 mmHg — ABNORMAL LOW (ref 35.0–45.0)
PH ARTERIAL: 7.51 — ABNORMAL HIGH (ref 7.35–7.45)
PH ARTERIAL: 7.45 (ref 7.35–7.45)
PH ARTERIAL: 7.41 (ref 7.35–7.45)
PH ARTERIAL: 7.45 (ref 7.35–7.45)
PH ARTERIAL: 7.37 (ref 7.35–7.45)
PH ARTERIAL: 7.37 (ref 7.35–7.45)
PH ARTERIAL: 7.34 — ABNORMAL LOW (ref 7.35–7.45)
PO2 ARTERIAL: 149 mmHg — ABNORMAL HIGH (ref 80.0–110.0)
PO2 ARTERIAL: 126 mmHg — ABNORMAL HIGH (ref 80.0–110.0)
PO2 ARTERIAL: 153 mmHg — ABNORMAL HIGH (ref 80.0–110.0)
PO2 ARTERIAL: 157 mmHg — ABNORMAL HIGH (ref 80.0–110.0)
PO2 ARTERIAL: 154 mmHg — ABNORMAL HIGH (ref 80.0–110.0)
PO2 ARTERIAL: 134 mmHg — ABNORMAL HIGH (ref 80.0–110.0)
PO2 ARTERIAL: 83.8 mmHg (ref 80.0–110.0)
POTASSIUM WHOLE BLOOD: 2.9 mmol/L — ABNORMAL LOW (ref 3.4–4.6)
POTASSIUM WHOLE BLOOD: 3.4 mmol/L (ref 3.4–4.6)
POTASSIUM WHOLE BLOOD: 3.3 mmol/L — ABNORMAL LOW (ref 3.4–4.6)
POTASSIUM WHOLE BLOOD: 3.8 mmol/L (ref 3.4–4.6)
POTASSIUM WHOLE BLOOD: 3.4 mmol/L (ref 3.4–4.6)
POTASSIUM WHOLE BLOOD: 3.1 mmol/L — ABNORMAL LOW (ref 3.4–4.6)
POTASSIUM WHOLE BLOOD: 2.6 mmol/L — CL (ref 3.4–4.6)
SODIUM WHOLE BLOOD: 135 mmol/L (ref 135–145)
SODIUM WHOLE BLOOD: 135 mmol/L (ref 135–145)
SODIUM WHOLE BLOOD: 137 mmol/L (ref 135–145)
SODIUM WHOLE BLOOD: 136 mmol/L (ref 135–145)
SODIUM WHOLE BLOOD: 140 mmol/L (ref 135–145)
SODIUM WHOLE BLOOD: 141 mmol/L (ref 135–145)
SODIUM WHOLE BLOOD: 144 mmol/L (ref 135–145)

## 2022-02-15 LAB — COMPREHENSIVE METABOLIC PANEL
ALBUMIN: 2.6 g/dL — ABNORMAL LOW (ref 3.4–5.0)
ALKALINE PHOSPHATASE: 65 U/L (ref 46–116)
ALT (SGPT): 40 U/L (ref 10–49)
ANION GAP: 14 mmol/L (ref 5–14)
AST (SGOT): 69 U/L — ABNORMAL HIGH (ref <=34)
BILIRUBIN TOTAL: 1.8 mg/dL — ABNORMAL HIGH (ref 0.3–1.2)
BLOOD UREA NITROGEN: 7 mg/dL — ABNORMAL LOW (ref 9–23)
BUN / CREAT RATIO: 18
CALCIUM: 8.9 mg/dL (ref 8.7–10.4)
CHLORIDE: 109 mmol/L — ABNORMAL HIGH (ref 98–107)
CO2: 18 mmol/L — ABNORMAL LOW (ref 20.0–31.0)
CREATININE: 0.4 mg/dL — ABNORMAL LOW
EGFR CKD-EPI (2021) FEMALE: >90 mL/min/{1.73_m2} (ref >=60)
GLUCOSE RANDOM: 217 mg/dL — ABNORMAL HIGH (ref 70–99)
POTASSIUM: 3.4 mmol/L (ref 3.4–4.8)
PROTEIN TOTAL: 4.5 g/dL — ABNORMAL LOW (ref 5.7–8.2)
SODIUM: 141 mmol/L (ref 135–145)

## 2022-02-15 LAB — CBC
HEMATOCRIT: 24.6 % — ABNORMAL LOW (ref 34.0–44.0)
HEMOGLOBIN: 8.1 g/dL — ABNORMAL LOW (ref 11.3–14.9)
MEAN CORPUSCULAR HEMOGLOBIN CONC: 32.9 g/dL (ref 32.0–36.0)
MEAN CORPUSCULAR HEMOGLOBIN: 26.9 pg (ref 25.9–32.4)
MEAN CORPUSCULAR VOLUME: 81.7 fL (ref 77.6–95.7)
MEAN PLATELET VOLUME: 6.9 fL (ref 6.8–10.7)
PLATELET COUNT: 371 10*9/L (ref 150–450)
RED BLOOD CELL COUNT: 3.01 10*12/L — ABNORMAL LOW (ref 3.95–5.13)
RED CELL DISTRIBUTION WIDTH: 17.2 % — ABNORMAL HIGH (ref 12.2–15.2)

## 2022-02-15 LAB — PHOSPHORUS: PHOSPHORUS: 3.8 mg/dL (ref 2.4–5.1)

## 2022-02-15 LAB — LIPASE: LIPASE: 19 U/L (ref 12–53)

## 2022-02-15 LAB — MAGNESIUM: MAGNESIUM: 1.4 mg/dL — ABNORMAL LOW (ref 1.6–2.6)

## 2022-02-15 LAB — AMYLASE: AMYLASE: <20 U/L — ABNORMAL LOW (ref 30–118)

## 2022-02-15 MED ADMIN — phenylephrine 0.8 mg/10 mL (80 mcg/mL) injection: INTRAVENOUS | @ 16:00:00 | Stop: 2022-02-15

## 2022-02-15 MED ADMIN — phenylephrine 0.8 mg/10 mL (80 mcg/mL) injection: INTRAVENOUS | @ 14:00:00 | Stop: 2022-02-15

## 2022-02-15 MED ADMIN — phenylephrine 0.8 mg/10 mL (80 mcg/mL) injection: INTRAVENOUS | @ 20:00:00 | Stop: 2022-02-15

## 2022-02-15 MED ADMIN — calcium chloride 100 mg/mL (10 %) injection: INTRAVENOUS | @ 17:00:00 | Stop: 2022-02-15

## 2022-02-15 MED ADMIN — bupivacaine (PF) (MARCAINE) 0.25 % (2.5 mg/mL) injection (PF): EPIDURAL | @ 14:00:00 | Stop: 2022-02-15

## 2022-02-15 MED ADMIN — ROCuronium (ZEMURON) injection: INTRAVENOUS | @ 17:00:00 | Stop: 2022-02-15

## 2022-02-15 MED ADMIN — haloperidol LACTATE (HALDOL) injection 1 mg: 1 mg | INTRAVENOUS | @ 22:00:00 | Stop: 2022-02-15

## 2022-02-15 MED ADMIN — phenylephrine 0.8 mg/10 mL (80 mcg/mL) injection: INTRAVENOUS | @ 17:00:00 | Stop: 2022-02-15

## 2022-02-15 MED ADMIN — lactated Ringers infusion: 10 mL/h | INTRAVENOUS | @ 12:00:00 | Stop: 2022-02-15

## 2022-02-15 MED ADMIN — ROCuronium (ZEMURON) injection: INTRAVENOUS | @ 18:00:00 | Stop: 2022-02-15

## 2022-02-15 MED ADMIN — sugammadex (BRIDION) injection: INTRAVENOUS | @ 21:00:00 | Stop: 2022-02-15

## 2022-02-15 MED ADMIN — phenylephrine 0.8 mg/10 mL (80 mcg/mL) injection: INTRAVENOUS | @ 15:00:00 | Stop: 2022-02-15

## 2022-02-15 MED ADMIN — calcium chloride 100 mg/mL (10 %) injection: INTRAVENOUS | @ 14:00:00 | Stop: 2022-02-15

## 2022-02-15 MED ADMIN — acetaminophen (TYLENOL) tablet 1,000 mg: 1000 mg | ORAL | @ 11:00:00 | Stop: 2022-02-15

## 2022-02-15 MED ADMIN — ondansetron (ZOFRAN) injection: INTRAVENOUS | @ 21:00:00 | Stop: 2022-02-15

## 2022-02-15 MED ADMIN — haloperidol LACTATE (HALDOL) 5 mg/mL injection: INTRAVENOUS | @ 22:00:00 | Stop: 2022-02-15

## 2022-02-15 MED ADMIN — ceFAZolin (ANCEF) injection: INTRAVENOUS | @ 13:00:00 | Stop: 2022-02-15

## 2022-02-15 MED ADMIN — sodium chloride irrigation (NS) 0.9 % irrigation solution: @ 20:00:00 | Stop: 2022-02-15

## 2022-02-15 MED ADMIN — ROCuronium (ZEMURON) injection: INTRAVENOUS | @ 13:00:00 | Stop: 2022-02-15

## 2022-02-15 MED ADMIN — CISplatin (PLATINOL) 35.75 mg intraperitoneal infusion: 25 mg/m2 | INTRAPERITONEAL | @ 19:00:00 | Stop: 2022-02-15

## 2022-02-15 MED ADMIN — pantoprazole (Protonix) injection 40 mg: 40 mg | INTRAVENOUS | @ 23:00:00

## 2022-02-15 MED ADMIN — albumin human 5 % 5 % bottle: INTRAVENOUS | @ 21:00:00 | Stop: 2022-02-15

## 2022-02-15 MED ADMIN — albumin human 5 % 5 % bottle: INTRAVENOUS | @ 15:00:00 | Stop: 2022-02-15

## 2022-02-15 MED ADMIN — ROCuronium (ZEMURON) injection: INTRAVENOUS | @ 14:00:00 | Stop: 2022-02-15

## 2022-02-15 MED ADMIN — phenylephrine 0.8 mg/10 mL (80 mcg/mL) injection: INTRAVENOUS | @ 13:00:00 | Stop: 2022-02-15

## 2022-02-15 MED ADMIN — sodium thiosulfate 17.15 g in sodium chloride (NS) 0.9 % 1,000 mL infusion: 12 g/m2 | INTRAVENOUS | @ 18:00:00 | Stop: 2022-02-15

## 2022-02-15 MED ADMIN — HYDROmorphone (PF) (DILAUDID) injection: INTRAVENOUS | @ 17:00:00 | Stop: 2022-02-15

## 2022-02-15 MED ADMIN — ceFAZolin (ANCEF) injection: INTRAVENOUS | @ 21:00:00 | Stop: 2022-02-15

## 2022-02-15 MED ADMIN — calcium chloride 100 mg/mL (10 %) injection: INTRAVENOUS | @ 20:00:00 | Stop: 2022-02-15

## 2022-02-15 MED ADMIN — phenylephrine 20 mg in sodium chloride 0.9% 250 mL (80 mcg/mL) infusion PMB: INTRAVENOUS | @ 16:00:00 | Stop: 2022-02-15

## 2022-02-15 MED ADMIN — ROCuronium (ZEMURON) injection: INTRAVENOUS | @ 19:00:00 | Stop: 2022-02-15

## 2022-02-15 MED ADMIN — CISplatin (PLATINOL) 71.5 mg intraperitoneal infusion: 50 mg/m2 | INTRAPERITONEAL | @ 18:00:00 | Stop: 2022-02-15

## 2022-02-15 MED ADMIN — electrolyte-A (PLASMA-LYT A) infusion: INTRAVENOUS | @ 14:00:00 | Stop: 2022-02-15

## 2022-02-15 MED ADMIN — Propofol (DIPRIVAN) injection: INTRAVENOUS | @ 13:00:00 | Stop: 2022-02-15

## 2022-02-15 MED ADMIN — NORepinephrine 8 mg in dextrose 5 % 250 mL (32 mcg/mL) infusion PMB: INTRAVENOUS | @ 14:00:00 | Stop: 2022-02-15

## 2022-02-15 MED ADMIN — scopolamine (TRANSDERM-SCOP) 1 mg over 3 days topical patch 1 mg: 1 | TOPICAL | @ 23:00:00

## 2022-02-15 MED ADMIN — prochlorperazine (COMPAZINE) 5 mg in sodium chloride (NS) 0.9 % 25 mL IVPB: 5 mg | INTRAVENOUS | @ 22:00:00

## 2022-02-15 MED ADMIN — pregabalin (LYRICA) capsule 100 mg: 100 mg | ORAL | @ 11:00:00 | Stop: 2022-02-15

## 2022-02-15 MED ADMIN — lidocaine (XYLOCAINE) 20 mg/mL (2 %) injection: INTRAVENOUS | @ 13:00:00 | Stop: 2022-02-15

## 2022-02-15 MED ADMIN — esmolol (BREVIBLOC) injection: INTRAVENOUS | @ 19:00:00 | Stop: 2022-02-15

## 2022-02-15 MED ADMIN — fentaNYL (PF) (SUBLIMAZE) injection: INTRAVENOUS | @ 13:00:00 | Stop: 2022-02-15

## 2022-02-15 MED ADMIN — midazolam (VERSED) injection: INTRAVENOUS | @ 12:00:00 | Stop: 2022-02-15

## 2022-02-15 MED ADMIN — lactated Ringers infusion: INTRAVENOUS | @ 12:00:00 | Stop: 2022-02-15

## 2022-02-15 MED ADMIN — dexAMETHasone (DECADRON) 4 mg/mL injection: INTRAVENOUS | @ 13:00:00 | Stop: 2022-02-15

## 2022-02-15 MED ADMIN — sodium thiosulfate 12.875 g in sodium chloride (NS) 0.9 % 250 mL IVPB: 9 g/m2 | INTRAVENOUS | @ 18:00:00 | Stop: 2022-02-15

## 2022-02-15 MED ADMIN — heparin (porcine) 5,000 unit/mL injection 5,000 Units: 5000 [IU] | SUBCUTANEOUS | @ 23:00:00

## 2022-02-15 MED ADMIN — ketamine (KETALAR) injection: INTRAVENOUS | @ 17:00:00 | Stop: 2022-02-15

## 2022-02-15 MED ADMIN — albumin human 5 % 5 % bottle: INTRAVENOUS | @ 18:00:00 | Stop: 2022-02-15

## 2022-02-15 MED ADMIN — ROCuronium (ZEMURON) injection: INTRAVENOUS | @ 15:00:00 | Stop: 2022-02-15

## 2022-02-15 MED ADMIN — lidocaine (XYLOCAINE) 20 mg/mL (2 %) injection: INTRAVENOUS | @ 12:00:00 | Stop: 2022-02-15

## 2022-02-15 MED ADMIN — albumin human 5 % 5 % bottle: INTRAVENOUS | @ 17:00:00 | Stop: 2022-02-15

## 2022-02-15 MED ADMIN — HYDROmorphone (PF) (DILAUDID) injection: INTRAVENOUS | @ 16:00:00 | Stop: 2022-02-15

## 2022-02-15 MED ADMIN — ceFAZolin (ANCEF) injection: INTRAVENOUS | @ 17:00:00 | Stop: 2022-02-15

## 2022-02-15 MED ADMIN — metroNIDAZOLE (FLAGYL) IVPB: INTRAVENOUS | @ 13:00:00 | Stop: 2022-02-15

## 2022-02-15 MED ADMIN — lactated Ringers infusion: 100 mL/h | INTRAVENOUS | @ 23:00:00

## 2022-02-15 MED ADMIN — calcium chloride 100 mg/mL (10 %) injection: INTRAVENOUS | @ 18:00:00 | Stop: 2022-02-15

## 2022-02-15 MED ADMIN — ROCuronium (ZEMURON) injection: INTRAVENOUS | @ 16:00:00 | Stop: 2022-02-15

## 2022-02-15 MED ADMIN — sterile water irrigation solution: @ 19:00:00 | Stop: 2022-02-15

## 2022-02-15 MED ADMIN — HYDROmorphone 10 mcg/mL bupivacaine 0.125% epidural: 6 mL/h | EPIDURAL | @ 16:00:00 | Stop: 2022-03-01

## 2022-02-15 NOTE — Unmapped (Cosign Needed)
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    Postoperative Diagnosis: Status post R0 cytoreductive surgery and heating intraperitoneal chemotherapy    Procedures: LAPAROSCOPY, ABDOMEN, PERITONEUM, & OMENTUM, DIAGNOSTIC, W/WO COLLECTION SPECIMEN(S) BY BRUSHING OR WASHING  EXCISION/DESTRUCTION, OPEN, INTRA-ABD TUMOR/CYST/ENDOMETRIOMA, 1+ PERITONEAL/MESENTERI/RETROPERIT 5CM OR <  COLECTOMY, PARTIAL; WITH ANASTOMOSIS  COLOSTOMY OR SKIN LEVEL CECOSTOMY  SPLENECTOMY; TOT (SEPART PROC)  EXPLORATORY LAPAROTOMY, EXPLORATORY CELIOTOMY WITH OR WITHOUT BIOPSY(S)  HYPERTHERMIA, EXTERNALLY GENERATED; DEEP  CHEMOTHERAPY ADMINISTRATION INTO THE PERITONEAL CAVITY VIA INDWELLING PORT OR CATHETER  RESECTION (INITIAL) OVARIAN, TUBAL/PRIM PERITONEAL MALIG W/BIL S&O/OMENTECT; W/RAD DISSECTION FOR DEBULKING  CHOLECYSTECTOMY  APPENDECTOMY  OMENTECTOMY, EPIPLOECTOMY, RESECTION OF OMENTUM  Procedures: Exploratory laparotomy, total abdominal hysterectomy with radical dissection, bilateral ureterolysis, bilateral salpingo-oophorectomy, posterior cul-de-sac peritonectomy, tumor debulking by Dr Pricilla Holm with Gynecology Oncology  Dr Vanessa Ralphs with surgical oncology: Diagnostic laparoscopy, exploratory laparotomy, low anterior resection with primary end-to-end anastomosis, enterolysis, cholecystectomy, tumor capsule tumor debulking, splenectomy, appendectomy, omentectomy, diverting loop ileostomy formation    Surgeon: Eugene Garnet, MD    Assistants: Chauncey Reading, MD - Fellow, Electa Sniff, MD - Resident    Anesthesia: General    Estimated Blood Loss: 1000 ml total    Fluids: , crystalloid  3u pRBC  1250 Albumin    Urine Output: , clear yellow    Findings: On diagnostic laparoscopy, only able to clearly visualize the left upper quadrant.  Adhesions of bowel in the right upper quadrant limiting survey, minimal ascites noted in the pelvis, scattered tumor plaques visualized on left diaphragm.  Given these findings, the decision was made to convert to exploratory laparotomy.  Multiple loops of small bowel encased in a Canc??n like structure in the midline with multiple filmy adhesions tethering to the superior aspect of the a large pelvic mass.  Multicystic and necrotic pelvic mass originating from left ovary, approximately 18 x 20 cm incorporated within the left broad ligament with the left tube draped overlying the left ovarian mass.  Bilateral tubes normal in appearance, normal-appearing uterus with smooth serosa.  Right adnexa similarly with approximate 10 x 8 multicystic and necrotic mass.  Diffuse tumor plaques encompassing posterior cul-de-sac peritoneum.  Rectosigmoid densely adhered to posterior aspect of left adnexal mass requiring dissection and ultimately resection.  Scattered treated subcentimeter nodules along small bowel mesentery.  5 x 5 cm necrotic and multicystic tumor at the level of the hepatic flexure.  Enlarged similar multicystic necrotic tumor adherent between the right diaphragm and the liver without any evidence of liver invasion and tumor encased solely within the liver capsule.  Multiple tumor nodules along the spleen requiring splenectomy.  Omentum overall fairly normal in appearance with only scattered small tumor deposits, no omental cake was encountered.  Please see surgical oncology operative note for further details regarding operative findings for their portion of the procedure.    Specimens:   ID Type Source Tests Collected by Time Destination   1 : Abdominal fat pad Tissue Abdomen SURGICAL PATHOLOGY EXAM Marion Downer, MD 02/15/2022 [phone]    2 : Left adnexa Tissue Abdomen SURGICAL PATHOLOGY EXAM Marion Downer, MD 02/15/2022 [phone]    3 : right adnexa Tissue Abdomen SURGICAL PATHOLOGY EXAM Marion Downer, MD 02/15/2022 [phone]    4 : Left perimetrium Tissue Uterus SURGICAL PATHOLOGY EXAM Carver Fila, MD 02/15/2022 [phone]    5 : Uterus and cervix Tissue Uterus  SURGICAL PATHOLOGY EXAM Marion Downer, MD 02/15/2022 1018    6 : Low Anterior Resection Tissue Abdomen SURGICAL PATHOLOGY EXAM Marion Downer, MD 02/15/2022 1055    7 : Right peritoneum Tissue Abdomen SURGICAL PATHOLOGY EXAM Marion Downer, MD 02/15/2022 1058    8 : Right retroperitoneal mass Tissue Abdomen SURGICAL PATHOLOGY EXAM Marion Downer, MD 02/15/2022 1101    9 : Omentum Tissue Omentum SURGICAL PATHOLOGY EXAM Marion Downer, MD 02/15/2022 1130    10 : Gallbladder Tissue Gallbladder SURGICAL PATHOLOGY EXAM Marion Downer, MD 02/15/2022 1143    11 : Right diaphragm Tissue Diaphragm  SURGICAL PATHOLOGY EXAM Marion Downer, MD 02/15/2022 1143    12 : Left diaphragm nodule Tissue Diaphragm  SURGICAL PATHOLOGY EXAM Marion Downer, MD 02/15/2022 1157    13 : Spleen Tissue Spleen SURGICAL PATHOLOGY EXAM Marion Downer, MD 02/15/2022 1204    14 : Appendix Tissue Appendix SURGICAL PATHOLOGY EXAM Marion Downer, MD 02/15/2022 1213    15 : Small bowel mesenteric nodules Tissue Small Bowel SURGICAL PATHOLOGY EXAM Marion Downer, MD 02/15/2022 1215    16 : Colon mesenteric nodule Tissue Colon SURGICAL PATHOLOGY EXAM Marion Downer, MD 02/15/2022 1221    17 : Hepatic flexure mass Tissue Liver SURGICAL PATHOLOGY EXAM Marion Downer, MD 02/15/2022 1227        Complications:  None    Indications for Procedure: Debra Ponce is a 52 y.o. 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  Prior to the procedure, all risks, benefits, and alternatives were discussed and informed surgical consent was signed.    Procedure: Patient was taken to the operating room where general anesthesia was achieved.  She was positioned in dorsal lithotomy and prepped and draped per the surgical oncology team.  A foley catheter was inserted into the bladder.  A 5 mm skin incision was made in the left upper quadrant at Palmer's point, the abdomen was then entered with direct Optiview entry and the abdomen was insufflated.  Findings were noted as above.      The laparoscope was then removed from the abdomen, the pneumoperitoneum was maintained.  A vertical midline incision was made with the scalpel and the abdomen was entered sharply.  The abdomen and pelvis were surveyed with findings as documented above. A wound protector was placed followed by the [facility] retractor.     The enlarged left adnexal mass was then carefully removed of its bilateral sidewall attachments using a mixture of blunt and cautery dissection.  Multiple small bowel loops were adhered to the superior and posterior aspect of the mass with filmy adhesions which were carefully bluntly resected.  The rectosigmoid was noted to be densely adhered to the posterior aspect of the pelvic mass.  Dissection was attempted using mixture of blunt dissection as well as sharp dissection with the Metzenbaum scissors, and unavoidable enterotomy of approximately 4 cm was made to the anterior surface of the rectosigmoid.  This enterotomy was then closed with 4-0 PDS in a running fashion to minimize contamination. Further dissection removing the remainder of the rectosigmoid from the posterior aspect of the pelvic mass was then undertaken. The pelvic mass was then able to be fully elevated from the pelvis.  The left infundibulopelvic ligament was able to be isolated with the left ureter clearly visualized below.  The left ureter was further dissected  away from the medial leaf of the broad ligament, and a vessel loop was placed.  The left infundibulopelvic ligament was then isolated, clamped, and transected using the LigaSure device as well as and suture-ligated.  The left utero-ovarian ligament was then isolated, clamped, cauterized and transected.  The left pelvic mass was then removed entirely.    Attention was then turned to the right.  An additional enlarged multicystic right adnexal mass was noted, this was carefully elevated from the pelvis using blunt dissection.  The right round ligament was then grasped, elevated, and transected.  The broad ligament was then opened posteriorly.  The ureter was identified and the right infundibulopelvic ligament was then isolated, clamped, transected, and tied. The right utero ovarian ligament was then isolated, clamped, and transected using the LigaSure device and the right adnexal mass was removed.    Attention then was turned toward the hysterectomy.  A sponge stick was placed within the vagina.  The right broad ligament dissection was opened anteriorly and the bladder flap was developed.  The left round ligament was then further transected, and the broad ligament opened posteriorly and anteriorly to complete the bladder flap.  The right uterine artery was skeletonized, clamped, transected, and suture-ligated.  There was significant parametrial tumor involvement on the left side, and therefore a radical dissection was required to isolate the blood supply.  The left ureter was further dissected down to the level near the trigone.  The left uterine artery was traced to its origin, isolated, and clamped with vascular clips.  Similarly the left uterine vein due to significant necrotic tumor involvement was friable.  Detachment was similarly carefully isolated, and clipped with vascular clips.  Sequential clamps were then used to transect the remainder of the broad and cardinal ligaments bilaterally, with each pedicle being suture-ligated.  A large right angle clamp was then able to be placed below the cervix, and the uterus and cervix were amputated from the vagina.  The vaginal cuff was then closed with several figure-of-eight sutures using 0 Vicryl.    The abdomen and pelvis was irrigated and all operative sites at that time were found to be hemostatic. The case was then turned back over to the surgical oncology team. Please see their separate of op note for the remainder of the procedure.    Attending attestation: Dr. Eugene Garnet was present and scrubbed for the entire procedure.

## 2022-02-16 LAB — CBC
HEMATOCRIT: 24.6 % — ABNORMAL LOW (ref 34.0–44.0)
HEMATOCRIT: 22.3 % — ABNORMAL LOW (ref 34.0–44.0)
HEMOGLOBIN: 8.1 g/dL — ABNORMAL LOW (ref 11.3–14.9)
HEMOGLOBIN: 7.4 g/dL — ABNORMAL LOW (ref 11.3–14.9)
MEAN CORPUSCULAR HEMOGLOBIN CONC: 32.9 g/dL (ref 32.0–36.0)
MEAN CORPUSCULAR HEMOGLOBIN CONC: 33.5 g/dL (ref 32.0–36.0)
MEAN CORPUSCULAR HEMOGLOBIN: 26.9 pg (ref 25.9–32.4)
MEAN CORPUSCULAR HEMOGLOBIN: 27.4 pg (ref 25.9–32.4)
MEAN CORPUSCULAR VOLUME: 81.7 fL (ref 77.6–95.7)
MEAN CORPUSCULAR VOLUME: 81.9 fL (ref 77.6–95.7)
MEAN PLATELET VOLUME: 6.9 fL (ref 6.8–10.7)
MEAN PLATELET VOLUME: 6.6 fL — ABNORMAL LOW (ref 6.8–10.7)
PLATELET COUNT: 371 10*9/L (ref 150–450)
PLATELET COUNT: 314 10*9/L (ref 150–450)
RED BLOOD CELL COUNT: 3.01 10*12/L — ABNORMAL LOW (ref 3.95–5.13)
RED BLOOD CELL COUNT: 2.72 10*12/L — ABNORMAL LOW (ref 3.95–5.13)
RED CELL DISTRIBUTION WIDTH: 17.2 % — ABNORMAL HIGH (ref 12.2–15.2)
RED CELL DISTRIBUTION WIDTH: 17.7 % — ABNORMAL HIGH (ref 12.2–15.2)
WBC ADJUSTED: 68.2 10*9/L (ref 3.6–11.2)
WBC ADJUSTED: 66.9 10*9/L (ref 3.6–11.2)

## 2022-02-16 LAB — BLOOD GAS CRITICAL CARE PANEL, ARTERIAL
BASE EXCESS ARTERIAL: -3.3 — ABNORMAL LOW (ref -2.0–2.0)
CALCIUM IONIZED ARTERIAL (MG/DL): 4.66 mg/dL (ref 4.40–5.40)
GLUCOSE WHOLE BLOOD: 116 mg/dL (ref 70–179)
HCO3 ARTERIAL: 21 mmol/L — ABNORMAL LOW (ref 22–27)
HEMOGLOBIN BLOOD GAS: 7.9 g/dL — ABNORMAL LOW
LACTATE BLOOD ARTERIAL: 1 mmol/L (ref <1.3)
O2 SATURATION ARTERIAL: 96 % (ref 94.0–100.0)
PCO2 ARTERIAL: 36.6 mmHg (ref 35.0–45.0)
PH ARTERIAL: 7.38 (ref 7.35–7.45)
PO2 ARTERIAL: 81.1 mmHg (ref 80.0–110.0)
POTASSIUM WHOLE BLOOD: 3.5 mmol/L (ref 3.4–4.6)
SODIUM WHOLE BLOOD: 139 mmol/L (ref 135–145)

## 2022-02-16 LAB — COMPREHENSIVE METABOLIC PANEL
ALBUMIN: 1.9 g/dL — ABNORMAL LOW (ref 3.4–5.0)
ALKALINE PHOSPHATASE: 54 U/L (ref 46–116)
ALT (SGPT): 25 U/L (ref 10–49)
ANION GAP: 8 mmol/L (ref 5–14)
AST (SGOT): 29 U/L (ref <=34)
BILIRUBIN TOTAL: 0.5 mg/dL (ref 0.3–1.2)
BLOOD UREA NITROGEN: 5 mg/dL — ABNORMAL LOW (ref 9–23)
BUN / CREAT RATIO: 10
CALCIUM: 7.9 mg/dL — ABNORMAL LOW (ref 8.7–10.4)
CHLORIDE: 112 mmol/L — ABNORMAL HIGH (ref 98–107)
CO2: 21 mmol/L (ref 20.0–31.0)
CREATININE: 0.5 mg/dL — ABNORMAL LOW
EGFR CKD-EPI (2021) FEMALE: >90 mL/min/{1.73_m2} (ref >=60)
GLUCOSE RANDOM: 118 mg/dL — ABNORMAL HIGH (ref 70–99)
POTASSIUM: 3.9 mmol/L (ref 3.4–4.8)
PROTEIN TOTAL: 3.8 g/dL — ABNORMAL LOW (ref 5.7–8.2)
SODIUM: 141 mmol/L (ref 135–145)

## 2022-02-16 LAB — BASIC METABOLIC PANEL
ANION GAP: 8 mmol/L (ref 5–14)
BLOOD UREA NITROGEN: <5 mg/dL — ABNORMAL LOW (ref 9–23)
CALCIUM: 7.6 mg/dL — ABNORMAL LOW (ref 8.7–10.4)
CHLORIDE: 113 mmol/L — ABNORMAL HIGH (ref 98–107)
CO2: 22 mmol/L (ref 20.0–31.0)
CREATININE: 0.5 mg/dL — ABNORMAL LOW
EGFR CKD-EPI (2021) FEMALE: >90 mL/min/{1.73_m2} (ref >=60)
GLUCOSE RANDOM: 119 mg/dL (ref 70–179)
POTASSIUM: 4 mmol/L (ref 3.4–4.8)
SODIUM: 143 mmol/L (ref 135–145)

## 2022-02-16 LAB — LIPASE: LIPASE: 18 U/L (ref 12–53)

## 2022-02-16 LAB — PHOSPHORUS: PHOSPHORUS: 4.3 mg/dL (ref 2.4–5.1)

## 2022-02-16 LAB — AMYLASE: AMYLASE: 20 U/L — ABNORMAL LOW (ref 30–118)

## 2022-02-16 LAB — MAGNESIUM: MAGNESIUM: 2.4 mg/dL (ref 1.6–2.6)

## 2022-02-16 MED ADMIN — magnesium sulfate 2gm/50mL IVPB: 2 g | INTRAVENOUS | @ 10:00:00

## 2022-02-16 MED ADMIN — potassium chloride 10 mEq in 100 mL IVPB: 10 meq | INTRAVENOUS | @ 07:00:00 | Stop: 2023-02-15

## 2022-02-16 MED ADMIN — lactated ringers bolus 1,000 mL: 1000 mL | INTRAVENOUS | @ 12:00:00 | Stop: 2022-02-16

## 2022-02-16 MED ADMIN — lactated ringers bolus 500 mL: 500 mL | INTRAVENOUS | @ 05:00:00

## 2022-02-16 MED ADMIN — pantoprazole (Protonix) injection 40 mg: 40 mg | INTRAVENOUS | @ 14:00:00 | Stop: 2022-02-16

## 2022-02-16 MED ADMIN — magnesium sulfate 2gm/50mL IVPB: 2 g | INTRAVENOUS | @ 08:00:00

## 2022-02-16 MED ADMIN — heparin (porcine) 5,000 unit/mL injection 5,000 Units: 5000 [IU] | SUBCUTANEOUS | @ 23:00:00

## 2022-02-16 MED ADMIN — HYDROmorphone (PF) injection Syrg 0.5 mg: .5 mg | INTRAVENOUS | @ 18:00:00 | Stop: 2022-02-16

## 2022-02-16 MED ADMIN — potassium chloride 10 mEq in 100 mL IVPB: 10 meq | INTRAVENOUS | @ 10:00:00 | Stop: 2023-02-15

## 2022-02-16 MED ADMIN — acetaminophen (OFIRMEV) 10 mg/mL injection 650 mg 65 mL: 650 mg | INTRAVENOUS | @ 20:00:00 | Stop: 2022-02-17

## 2022-02-16 MED ADMIN — HYDROmorphone (PF) injection Syrg 0.5 mg: .5 mg | INTRAVENOUS | @ 13:00:00 | Stop: 2022-02-16

## 2022-02-16 MED ADMIN — lactated ringers bolus 1,000 mL: 1000 mL | INTRAVENOUS | @ 22:00:00 | Stop: 2022-02-16

## 2022-02-16 MED ADMIN — heparin (porcine) 5,000 unit/mL injection 5,000 Units: 5000 [IU] | SUBCUTANEOUS | @ 15:00:00

## 2022-02-16 MED ADMIN — potassium chloride 10 mEq in 100 mL IVPB: 10 meq | INTRAVENOUS | @ 04:00:00 | Stop: 2023-02-15

## 2022-02-16 MED ADMIN — acetaminophen (OFIRMEV) 10 mg/mL injection 650 mg 65 mL: 650 mg | INTRAVENOUS | @ 14:00:00 | Stop: 2022-02-17

## 2022-02-16 MED ADMIN — magnesium sulfate 2gm/50mL IVPB: 2 g | INTRAVENOUS | @ 06:00:00

## 2022-02-16 MED ADMIN — albumin human 5 % 5 % bottle 25 g: 25 g | INTRAVENOUS | @ 10:00:00 | Stop: 2022-02-16

## 2022-02-16 MED ADMIN — acetaminophen (OFIRMEV) 10 mg/mL injection 650 mg 65 mL: 650 mg | INTRAVENOUS | @ 02:00:00 | Stop: 2022-02-16

## 2022-02-16 MED ADMIN — dextrose (D10W) 10% bolus 125 mL: 12.5 g | INTRAVENOUS | @ 11:00:00 | Stop: 2023-02-15

## 2022-02-16 MED ADMIN — heparin (porcine) 5,000 unit/mL injection 5,000 Units: 5000 [IU] | SUBCUTANEOUS | @ 06:00:00

## 2022-02-16 MED ADMIN — acetaminophen (OFIRMEV) 10 mg/mL injection 650 mg 65 mL: 650 mg | INTRAVENOUS | @ 08:00:00 | Stop: 2022-02-17

## 2022-02-16 MED ADMIN — potassium chloride 10 mEq in 100 mL IVPB: 10 meq | INTRAVENOUS | @ 05:00:00 | Stop: 2023-02-15

## 2022-02-16 MED ADMIN — lactated ringers bolus 500 mL: 500 mL | INTRAVENOUS | @ 08:00:00 | Stop: 2022-02-16

## 2022-02-16 MED ADMIN — HYDROmorphone (PF) injection Syrg 0.25 mg: .25 mg | INTRAVENOUS | @ 23:00:00 | Stop: 2022-03-01

## 2022-02-16 MED ADMIN — potassium chloride 10 mEq in 100 mL IVPB: 10 meq | INTRAVENOUS | @ 06:00:00 | Stop: 2023-02-15

## 2022-02-16 NOTE — Unmapped (Signed)
Surgical ICU Admission Note    Date of service: 02/15/2022    Hospital Day:  LOS: 0 days   Surgery Date(s): 02/15/22  Requesting service/attending: SRA-1, Dr. Tora Duck  Consulting ICU Attending: Dr. Nilsa Nutting      Assessment   Debra Ponce is a 52 y.o. 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     Plan:     Neuro  - Pain- IV Tylenol, PRN Dilaudid   - APS following - hydromorphone epidural     Pulm  - On RA, O2 as needed, wean to SpO2 > 90%      CV   - HDS  - Arterial line      FEN/GI  F: LR @100   E: Replete lytes as needed  N: NPO  - NGT to LIWS     *s/p HIPEC w/ Cytoreductive Surgery   - monitor ROBF  - drain amylase POD 1, 3, and 5    Endo  - Appropriate glycemic control, no medications indicated     GU  - Maintain 10ml/kg/hr output x 3 hours   - Foley cath until POD2     Heme/ID  - Abx: Not indicated   - SQH DVT ppx while epidural in place      Dispo:   - continue ICU care      Daily Care Checklist           DVT Prophylaxis: Mechanical: Yes.           HOB > 30 degrees: yes             Continued need for central/PICC line : no           Continue urinary catheter for: yes, strict intake and output, continue until POD2    PCP: Paul Dykes, MD   Source of Information: chart review     History of Present Illness:     Chief Complaint: Abdominal Pain    Debra Ponce is a 52 y.o. female with PMHx as noted below that presents to [facility] for scheduled elective HIPEC.     Patient is a 52 year old otherwise healthy female who was in her usual state of health when she stared noticing increased urinary frequency over 6/23-8/23. In 9/23 she began feeling a pelvic mass growing with rapid abdominal discomfort. She ultimately began having heavy menstruation and was evaluated by her PCP and OB-GYN. On their exams she had a significant abdominal mass.  CT 12/02/21 noted a 21.1 by 10.9 by 18.7 cm abdominopelvic mass, with a satellite mass in the right upper quadrant anteriorly, and moderate ascites with tumor deposits along the pelvic ascites posteriorly. 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     Allergies:  Latex, Sulfa (sulfonamide antibiotics), and Sulfamethoxazole-trimethoprim    Home Medications:   Medications Prior to Admission   Medication Sig Dispense Refill Last Dose    acetaminophen (TYLENOL) 325 MG tablet Take 2 tablets (650 mg total) by mouth every six (6) hours as needed for fever.   02/14/2022    bisacodyl (DULCOLAX) 5 mg EC tablet Take 4 tablets (20 mg total) by mouth once for 1 dose. Take with two 8 oz glasses of clear liquid at 9:00 am the  day prior to surgery. 4 tablet 0 02/14/2022    famotidine (PEPCID) 10 MG tablet Take 1 tablet (10 mg total) by mouth as needed for heartburn.   Past Month    FLUoxetine (PROZAC) 10 MG capsule Take 1 capsule (10 mg total) by mouth daily. 30 capsule 0 02/14/2022    metoPROLOL succinate (TOPROL XL) 50 MG 24 hr tablet Take 1 tablet (50 mg total) by mouth daily. 30 tablet 11 02/14/2022 at 2000    metroNIDAZOLE (FLAGYL) 500 MG tablet Take 1 tablet (500 mg total) by mouth Three (3) times a day for 1 day. Take one tablet at 2:00 pm, one tablet at 3:00 Pm and one at 10:00 pm the day prior to surgery for bowel prep 3 tablet 0 02/14/2022 at 2200    multivitamins, therapeutic with minerals 9 mg iron-400 mcg tablet Take 1 tablet by mouth daily. 130 tablet 1 Past Week    neomycin (MYCIFRADIN) 500 mg tablet Take 2 tablets (1,000 mg total) by mouth in the morning and 2 tablets (1,000 mg total) at noon and 2 tablets (1,000 mg total) in the evening. Do all this for 3 doses. Take at 2:00 pm, 3:00 pm, and 10:00 pm the day prior to surgery.. 6 tablet 0 02/14/2022 at 2200    ondansetron (ZOFRAN-ODT) 8 MG disintegrating tablet Dissolve 1 tablet (8 mg total) by mouth every eight (8) hours as needed. 60 tablet 0 Past Month    polyethylene glycol (GLYCOLAX) 17 gram/dose powder Mix (17 g) in 4-8 ounces of liquid and drink it by mouth daily. 476 g 0 02/14/2022    polyethylene glycol (MIRALAX) 17 gram packet Take 238 g by mouth once for 1 dose. Mix in 64oz of Gatorade. Drink 8oz of mixture q15 min until gone starting at 11am day prior to surgery. 14 packet 0 02/14/2022    apixaban (ELIQUIS) 2.5 mg Tab Take 1 tablet (2.5 mg total) by mouth two (2) times a day.   02/08/2022    docusate sodium (COLACE) 100 MG capsule Take 1 capsule (100 mg total) by mouth two (2) times a day. 60 capsule 0 02/13/2022    lidocaine-prilocaine (EMLA) 2.5-2.5 % cream Apply 1 g topically as needed. (Patient not taking: Reported on 02/04/2022)       pegfilgrastim (NEULASTA) 6 mg/0.69mL injection Inject 0.6 mL (6 mg total) under the skin every twenty-one (21) days. Administer 24-48 hours after completion of inpatient chemotherapy. 0.6 mL 1 01/21/2022    [EXPIRED] prochlorperazine (COMPAZINE) 10 MG tablet Take 1 tablet (10 mg total) by mouth every six (6) hours as needed (breakthrough nausea or vomiting) for up to 8 days. 30 tablet 0 02/11/2022    simethicone (MYLICON) 80 MG chewable tablet Chew 1 tablet (80 mg total) every six (6) hours as needed. 100 tablet 0 02/13/2022       Medical History:  No past medical history on file.    Surgical History:  Past Surgical History:   Procedure Laterality Date    EXPLORATORY LAPAROTOMY      TONSILLECTOMY  1996    TUBAL LIGATION Bilateral        Social History:  Tobacco use:   reports that she has never smoked. She has never used smokeless tobacco.  Alcohol use:   reports that she does not currently use alcohol.  Drug use:  reports no history of drug use.    Family History:  Family History   Problem Relation Age of Onset    Breast cancer  Mother     Stroke Mother     Heart attack Mother     Diabetes Mother     Heart attack Father     Atrial fibrillation Father     Diabetes Father     Lymphoma Paternal Uncle     Heart attack Maternal Grandmother     Diabetes Paternal Grandmother     Heart attack Paternal Grandfather     Diabetes Paternal Grandfather         Review of Systems:  A 12 system review of systems was negative except as noted in HPI.    Objective  Vitals:    02/15/22 0607   BP: 121/80   Pulse: 127   Resp: 20   Temp: 37.2 ??C (99 ??F)   SpO2: 96%       No intake/output data recorded.    Physical Exam  Gen: NAD  HEENT: NCAT, MMM  CV: RRR, distal pulses intact  Resp: NWOB  Abd: soft, mildly distended, drains w/ ss output, NGT in place w/ non-bilious output.   Neuro: alert and oriented    Continuous Infusions:    HYDROmorphone 10 mcg/mL bupivacaine 0.125% Stopped (02/15/22 1614)    lactated Ringers      sodium chloride         Labs    Chemistry  Recent Labs     02/15/22  1133 02/15/22  1315 02/15/22  1435   NA 136 140 141   K 3.8 3.4 3.1*       Hematology  No results for input(s): WBC, HGB, HCT, PLT in the last 72 hours.    Hepatic Function  No results for input(s): AST, ALT, ALP, BILITOT in the last 72 hours.  No results for input(s): BILIDIR, PROT, ALBUMIN, ALT, AST, ALKPHOS, GGT in the last 72 hours.  No results for input(s): LABPROT, INR, APTT in the last 72 hours.      Patient Lines/Drains/Airways Status      Patient Lines/Drains/Airways Status       Active Active Lines, Drains, & Airways       Name Placement date Placement time Site Days    Power Port--a-Cath Single Hub 12/08/21 Right 12/08/21  1030  --  [address]    Port A Cath - Double Hub 01/07/22 Right Chest 01/07/22  --  -- 39    Closed/Suction Drain Left;Anterior LUQ Bulb 19 Fr. 02/15/22  1524  LUQ  less than 1    Closed/Suction Drain Left;Anterior LLQ Bulb 19 Fr. 02/15/22  1523  LLQ  less than 1    Urethral Catheter Non-latex;Straight-tip;Temperature probe 16 Fr. 02/15/22  0750  Non-latex;Straight-tip;Temperature probe  less than 1    Epidural Catheter 02/15/22 02/15/22  0703  -- less than 1    Peripheral IV 02/15/22 Left;Posterior Hand 02/15/22  0635  Hand  less than 1    Peritoneal Dialysis Catheter 02/15/22 Mid lower abdomen 02/15/22  1246  Mid lower abdomen  less than 1                    Imaging  No results found.    Billey Chang, MD  General Surgery, PGY-2

## 2022-02-16 NOTE — Unmapped (Signed)
[facility] Liaison - Port Access Note    Indications:   [employer]in    The [facility] Liaison has assessed this patient's port site. Appropriate Huber needle size was obtained.  Port was accessed and dressing applied per protocol.  Blood return was noted.  Line flushes freely.       The primary RN was notified.     Thank you for this consult,  Shelva Majestic Gertrue Willette, RN, [facility] Liaison     Consult Time 30 minutes (min)

## 2022-02-16 NOTE — Unmapped (Signed)
GYN ONC PROGRESS NOTE    ASSESSMENT AND PLAN     Hospital Day: 2    Debra Ponce is a 52 y.o. 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     ONC: Stage IIIC mixed germ cell tumor  - Primary Oncologist: Dr. Pricilla Holm  - Last treatment: Bleomycin, Etoposide, Cisplatin on 11/28 (s/p 2C)  - Last CA-125: 469  - S/p above procedure   - Final path pending    Postoperative Care:  - H/H: 8.1 > EBL 1000 mL > 3u pRBCs intra-op > POD#1 7.4   - ERAS protocol for pain: sch tylenol, PRN Dilaudid; epidural   - Wean O2 as tolerated   - mIVF at 170mL/hr   - NPO, NGT in place; management per primary team  - Foley in place, management per primary team   - Bowel regimen  - S/p splenectomy; POD1, 3, 5 drain amylase; will need post splenectomy vaccines prior to discharge     Chronic Medical Conditions  - Anemia: Hgb trended as above  - GAD: Ativan PRN    PPX: SCDs, heparin, IS, ambulation    Dispo: ICU    Plan discussed with Dr. Hubert Azure and Attending Dr. Donnald Garre.    SUBJECTIVE     No acute events overnight. Pt reports pain well controlled with current regimen. She just pushed her button due to pain with moving but otherwise feels well. She denies any nausea or vomiting. She has a Foley in place.    OBJECTIVE     Temp:  [36.1 ??C (97 ??F)-37.2 ??C (99 ??F)] 36.8 ??C (98.2 ??F)  Heart Rate:  [99-127] 112  SpO2 Pulse:  [99-117] 112  Resp:  [13-26] 24  BP: (93-128)/(40-89) 97/43  A BP-1: (78-142)/(44-83) 102/44  SpO2:  [92 %-97 %] 96 %    Gen: NAD, awake, alert, oriented  CV: regular rate and rhythm   Pulm: clear to auscultation bilaterally, normal work of breathing  Abd: Soft, appropriately tender to palpation, non-distended, bowel sounds present; Ostomy with healthy appearing bowel, brown output in bag. 2 JP drains with serosanguinous fluid.   Incision: midline vertical incision clean, dry, intact closed with suture, with no surrounding erythema or induration  GU: Foley in place with clear yellow urine in bag      Intake/Output Summary (Last 24 hours) at 02/16/2022 0517  Last data filed at 02/16/2022 0304  Gross per 24 hour   Intake 6993.63 ml   Output 4280 ml   Net 2713.63 ml       Medications (scheduled)    acetaminophen  650 mg Intravenous Q6H    heparin (porcine) for subcutaneous use  5,000 Units Subcutaneous Q8H SCH    insulin regular  0-20 Units Subcutaneous Q6H SCH    pantoprazole (Protonix) intravenous solution  40 mg Intravenous Daily    scopolamine  1 patch Topical Q72H       Medications (prn)  calcium gluconate, dextrose in water, glucagon, HYDROmorphone **OR** HYDROmorphone, magnesium sulfate, naloxone, ondansetron, potassium chloride in water, prochlorperazine (COMPAZINE) 5 mg in sodium chloride (NS) 0.9 % 25 mL IVPB    Labs:  Lab Results   Component Value Date    WBC 66.9 (HH)  02/16/2022    HGB 7.4 (L) 02/16/2022    HCT 22.3 (L) 02/16/2022    PLT 314 02/16/2022       Lab Results   Component Value Date    NA 143 02/16/2022    K 4.0 02/16/2022    CL 113 (H) 02/16/2022    CO2 22.0 02/16/2022    BUN <5 (L) 02/16/2022    CREATININE 0.50 (L) 02/16/2022    GLU 119 02/16/2022    CALCIUM 7.6 (L) 02/16/2022    MG 2.4 02/16/2022    PHOS 4.3 02/16/2022       Lab Results   Component Value Date    BILITOT 0.5 02/16/2022    BILIDIR 0.30 01/10/2022    PROT 3.8 (L) 02/16/2022    ALBUMIN 1.9 (L) 02/16/2022    ALT 25 02/16/2022    AST 29 02/16/2022    ALKPHOS 54 02/16/2022       Lab Results   Component Value Date    INR 1.42 01/09/2022    APTT 33.4 01/09/2022     Scribe's Attestation: Electa Sniff, MD obtained and performed the history, physical exam and medical decision making elements that were entered into the chart. Documentation assistance was provided by me personally, a scribe. Signed by Marilynn Latino, Scribe, on February 16, 2022 at 5:17 AM.       --------------------------------------------------------------------------------------------------------------------------------------------------------------------------------------------------------------------------------------------------------------  February 16, 2022 7:06 AM Documentation assistance provided by the Scribe. I was present during the time the encounter was recorded. The information recorded by the Scribe was done at my direction and has been reviewed and validated by me.  ---------------------------------------------------------------------------------------------------------------------------------------------------------------------------------------------------------------------------------------------------------------

## 2022-02-16 NOTE — Unmapped (Signed)
Patient drowsy and oriented x4, follows commands. Pain managed with PCA pump, no nausea during shift. Afebrile, hypotensive, and tachycardic throughout shift, MD aware, multiple LR boluses and albumin given. On room air and tolerating well. NG to low intermittent wall suction. RUQ ostomy stool noted, foley in place with adequate UOP. Magnesium and potassium replaced (see MAR). PRN D10 given for BG 66, MD aware.     Problem: Latex Allergy  Goal: Absence of Allergy Symptoms  Outcome: Ongoing - Unchanged     Problem: Wound  Goal: Optimal Coping  Outcome: Ongoing - Unchanged  Goal: Optimal Functional Ability  Outcome: Ongoing - Unchanged  Intervention: Optimize Functional Ability  Recent Flowsheet Documentation  Taken 02/16/2022 0600 by Aretta Nip, Marvia Pickles, RN  Activity Management: bedrest  Taken 02/16/2022 0400 by Aretta Nip, Marvia Pickles, RN  Activity Management: bedrest  Taken 02/16/2022 0200 by Aretta Nip, Marvia Pickles, RN  Activity Management: bedrest  Taken 02/16/2022 0000 by Aretta Nip, Marvia Pickles, RN  Activity Management: bedrest  Taken 02/15/2022 2200 by Aretta Nip, Marvia Pickles, RN  Activity Management: bedrest  Taken 02/15/2022 2000 by Aretta Nip, Marvia Pickles, RN  Activity Management: bedrest  Goal: Absence of Infection Signs and Symptoms  Outcome: Ongoing - Unchanged  Intervention: Prevent or Manage Infection  Recent Flowsheet Documentation  Taken 02/15/2022 2000 by Aretta Nip, Marvia Pickles, RN  Infection Management: aseptic technique maintained  Goal: Improved Oral Intake  Outcome: Ongoing - Unchanged  Goal: Optimal Pain Control and Function  Outcome: Ongoing - Unchanged  Intervention: Prevent or Manage Pain  Recent Flowsheet Documentation  Taken 02/15/2022 2000 by Aretta Nip, Marvia Pickles, RN  Sleep/Rest Enhancement:   awakenings minimized   room darkened  Goal: Skin Health and Integrity  Outcome: Ongoing - Unchanged  Intervention: Optimize Skin Protection  Recent Flowsheet Documentation  Taken 02/16/2022 0600 by Aretta Nip, Marvia Pickles, RN  Activity Management: bedrest  Head of Bed [facility]) Positioning: HOB at 20-30 degrees  Taken 02/16/2022 0400 by Aretta Nip, Marvia Pickles, RN  Activity Management: bedrest  Head of Bed [facility]) Positioning: HOB at 20-30 degrees  Taken 02/16/2022 0200 by Aretta Nip, Marvia Pickles, RN  Activity Management: bedrest  Head of Bed [facility]) Positioning: HOB at 20-30 degrees  Taken 02/16/2022 0000 by Aretta Nip, Marvia Pickles, RN  Activity Management: bedrest  Head of Bed [facility]) Positioning: HOB at 20-30 degrees  Taken 02/15/2022 2200 by Aretta Nip, Marvia Pickles, RN  Activity Management: bedrest  Head of Bed [facility]) Positioning: HOB at 20-30 degrees  Taken 02/15/2022 2000 by Aretta Nip, Marvia Pickles, RN  Activity Management: bedrest  Pressure Reduction Techniques: frequent weight shift encouraged  Head of Bed (HOB) Positioning: HOB at 30-45 degrees  Pressure Reduction Devices:   specialty bed utilized   foam padding utilized   pressure-redistributing mattress utilized   positioning supports utilized  Skin Protection:   adhesive use limited   cleansing with dimethicone incontinence wipes   tubing/devices free from skin contact   transparent dressing maintained   skin-to-skin areas padded   skin-to-device areas padded   skin sealant/moisture barrier applied   silicone foam dressing in place   incontinence pads utilized  Goal: Optimal Wound Healing  Outcome: Ongoing - Unchanged  Intervention: Promote Wound Healing  Recent Flowsheet Documentation  Taken 02/15/2022 2000 by Aretta Nip, Marvia Pickles, RN  Sleep/Rest Enhancement:   awakenings minimized   room darkened     Problem: Adult Inpatient Plan of Care  Goal: Plan of Care Review  Outcome: Ongoing - Unchanged  Goal: Patient-Specific Goal (Individualized)  Outcome: Ongoing -  Unchanged  Goal: Absence of Hospital-Acquired Illness or Injury  Outcome: Ongoing - Unchanged  Intervention: Identify and Manage Fall Risk  Recent Flowsheet Documentation  Taken 02/15/2022 2000 by Aretta Nip, Marvia Pickles, RN  Safety Interventions:   bed alarm   bleeding precautions   environmental modification   fall reduction program maintained   infection management   lighting adjusted for tasks/safety   low bed  Intervention: Prevent Skin Injury  Recent Flowsheet Documentation  Taken 02/16/2022 0600 by Maurine Minister, RN  Positioning for Skin: (repositions self) Other (Comment)  Taken 02/16/2022 0400 by Maurine Minister, RN  Positioning for Skin: (repositions self) Other (Comment)  Taken 02/16/2022 0200 by Aretta Nip, Marvia Pickles, RN  Positioning for Skin: (repositions self) Other (Comment)  Taken 02/16/2022 0000 by Maurine Minister, RN  Positioning for Skin: (repositions self) Other (Comment)  Taken 02/15/2022 2200 by Aretta Nip, Marvia Pickles, RN  Positioning for Skin: (repositions self) Other (Comment)  Taken 02/15/2022 2000 by Aretta Nip, Marvia Pickles, RN  Positioning for Skin: (repositions self) Other (Comment)  Device Skin Pressure Protection:   absorbent pad utilized/changed   tubing/devices free from skin contact   skin-to-skin areas padded   skin-to-device areas padded   pressure points protected   positioning supports utilized   adhesive use limited  Skin Protection:   adhesive use limited   cleansing with dimethicone incontinence wipes   tubing/devices free from skin contact   transparent dressing maintained   skin-to-skin areas padded   skin-to-device areas padded   skin sealant/moisture barrier applied   silicone foam dressing in place   incontinence pads utilized  Intervention: Prevent and Manage VTE (Venous Thromboembolism) Risk  Recent Flowsheet Documentation  Taken 02/16/2022 0600 by Aretta Nip, Marvia Pickles, RN  Anti-Embolism Device Type: SCD, Knee  Anti-Embolism Intervention: On  Anti-Embolism Device Location: BLE  Taken 02/16/2022 0400 by Aretta Nip, Marvia Pickles, RN  Anti-Embolism Device Type: SCD, Knee  Anti-Embolism Intervention: On  Anti-Embolism Device Location: BLE  Taken 02/16/2022 0200 by Aretta Nip, Marvia Pickles, RN  Anti-Embolism Device Type: SCD, Knee  Anti-Embolism Intervention: On  Anti-Embolism Device Location: BLE  Taken 02/16/2022 0000 by Aretta Nip, Marvia Pickles, RN  Anti-Embolism Device Type: SCD, Knee  Anti-Embolism Intervention: On  Anti-Embolism Device Location: BLE  Taken 02/15/2022 2200 by Aretta Nip, Marvia Pickles, RN  Anti-Embolism Device Type: SCD, Knee  Anti-Embolism Intervention: On  Anti-Embolism Device Location: BLE  Taken 02/15/2022 2000 by Aretta Nip, Marvia Pickles, RN  VTE Prevention/Management:   anticoagulant therapy   bleeding precautions maintained   bleeding risk factors identified   intravenous hydration  Anti-Embolism Device Type: SCD, Knee  Anti-Embolism Intervention: On  Anti-Embolism Device Location: BLE  Intervention: Prevent Infection  Recent Flowsheet Documentation  Taken 02/15/2022 2000 by Aretta Nip, Marvia Pickles, RN  Infection Prevention:   cohorting utilized   environmental surveillance performed   equipment surfaces disinfected   hand hygiene promoted   personal protective equipment utilized   rest/sleep promoted   single patient room provided   visitors restricted/screened  Goal: Optimal Comfort and Wellbeing  Outcome: Ongoing - Unchanged  Goal: Readiness for Transition of Care  Outcome: Ongoing - Unchanged  Goal: Rounds/Family Conference  Outcome: Ongoing - Unchanged     Problem: Self-Care Deficit  Goal: Improved Ability to Complete Activities of Daily Living  Outcome: Ongoing - Unchanged     Problem: Fall Injury Risk  Goal: Absence of Fall and Fall-Related Injury  Outcome: Ongoing - Unchanged  Intervention: Promote Scientist, clinical (histocompatibility and immunogenetics) Documentation  Taken 02/15/2022 2000 by Aretta Nip, Marvia Pickles, RN  Safety Interventions:   bed alarm   bleeding precautions   environmental modification   fall reduction program maintained   infection management   lighting adjusted for tasks/safety   low bed     Problem: Skin Injury Risk Increased  Goal: Skin Health and Integrity  Outcome: Ongoing - Unchanged  Intervention: Optimize Skin Protection  Recent Flowsheet Documentation  Taken 02/16/2022 0600 by Aretta Nip, Marvia Pickles, RN  Activity Management: bedrest  Head of Bed [facility]) Positioning: HOB at 20-30 degrees  Taken 02/16/2022 0400 by Maurine Minister, RN  Activity Management: bedrest  Head of Bed [facility]) Positioning: HOB at 20-30 degrees  Taken 02/16/2022 0200 by Maurine Minister, RN  Activity Management: bedrest  Head of Bed [facility]) Positioning: HOB at 20-30 degrees  Taken 02/16/2022 0000 by Aretta Nip, Marvia Pickles, RN  Activity Management: bedrest  Head of Bed [facility]) Positioning: HOB at 20-30 degrees  Taken 02/15/2022 2200 by Aretta Nip, Marvia Pickles, RN  Activity Management: bedrest  Head of Bed [facility]) Positioning: HOB at 20-30 degrees  Taken 02/15/2022 2000 by Aretta Nip, Marvia Pickles, RN  Activity Management: bedrest  Pressure Reduction Techniques: frequent weight shift encouraged  Head of Bed (HOB) Positioning: HOB at 30-45 degrees  Pressure Reduction Devices:   specialty bed utilized   foam padding utilized   pressure-redistributing mattress utilized   positioning supports utilized  Skin Protection:   adhesive use limited   cleansing with dimethicone incontinence wipes   tubing/devices free from skin contact   transparent dressing maintained   skin-to-skin areas padded   skin-to-device areas padded   skin sealant/moisture barrier applied   silicone foam dressing in place   incontinence pads utilized

## 2022-02-16 NOTE — Unmapped (Signed)
Surgical ICU Progress Note    Interval History:  Pain controlled. No N/V. Not passing flatus. NPO. WOCN consulted for assistance with ostomy pouching. 2L LR and 500cc albumin overnight with stable tachycardia, adequate BP response. Aline fell out, not replaced.    Assessment   Debra Ponce is a 52 y.o. 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     Plan:     Neuro  #Acute postoperative pain  - IV Tylenol, PRN Dilaudid   - APS following - hydromorphone epidural    Psych  - Hold home prozac, resume when tolerating PO  - Hold home ativan 1mg  TID     Pulm  - On RA, O2 as needed  - Encourage IS, OOB     CV   Hypotension, stable tachycardia  - IVF resuscitation as needed  - Hold home meoprolol succ 50mg  daily  - MAP goal >65     FEN/GI  F: LR @100cc /hr  E: Replete lytes as needed  N: NPO  - NGT to LIWS   - home pepcid 10 mg IV    *s/p HIPEC w/ Cytoreductive Surgery, DLI  - monitor ROBF  - drain amylase POD 1, 3, and 5  - POD1 drain/serum amylase elevated 214/20  - WOCN consulted for ostomy pouching  - sched scopolamine, prn compazine for nausea    Endo  - Appropriate glycemic control  - SSI, none given 1/3  - D10 prn hypoglycemia     GU  - UOP adequate, Cr 0.5  - Foley cath until epidural removal per SRA     Heme/ID    DVT ppx: SQH while epidural in place   - Hold home eliquis 2.5 mg BID    Baseline pancytopenia 2/2 chemotherapy, now s/p splenectomy  #Acute blood loss anemia: hb 7.4   - s/p 3u pRBC intraoperatively  - hb goal >7    Leukocytosis s/p splenectomy: WBC 66.9   - Splenectomy vaccines on POD #14 (1/16) or prior to discharge      Dispo:   - continue ICU care      Daily Care Checklist           DVT Prophylaxis: Mechanical: Yes. SQH           HOB > 30 degrees: yes             Continued need for central/PICC line : no           Continue urinary catheter for: yes, strict intake and output, continue until epidural removed per SRA    PCP: Paul Dykes, MD   Source of Information: chart review     History of Present Illness:     Chief Complaint: Abdominal Pain    Debra Ponce is a 52 y.o. female with PMHx as noted below that presents to [facility] for scheduled elective HIPEC.     Patient is a 53 year old otherwise healthy female who was in her usual state of health when she stared noticing increased urinary frequency over 6/23-8/23. In 9/23 she began feeling a pelvic mass growing with rapid abdominal discomfort. She ultimately began having heavy menstruation and was evaluated by her PCP and OB-GYN. On their exams she had a significant abdominal mass.  CT 12/02/21 noted a 21.1 by 10.9 by 18.7 cm abdominopelvic mass, with  a satellite mass in the right upper quadrant anteriorly, and moderate ascites with tumor deposits along the pelvic ascites posteriorly. 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     Allergies:  Latex, Sulfa (sulfonamide antibiotics), and Sulfamethoxazole-trimethoprim    Home Medications:   Medications Prior to Admission   Medication Sig Dispense Refill Last Dose    acetaminophen (TYLENOL) 325 MG tablet Take 2 tablets (650 mg total) by mouth every six (6) hours as needed for fever.   02/14/2022    famotidine (PEPCID) 10 MG tablet Take 1 tablet (10 mg total) by mouth as needed for heartburn.   Past Month    FLUoxetine (PROZAC) 10 MG capsule Take 1 capsule (10 mg total) by mouth daily. 30 capsule 0 02/14/2022    metoPROLOL succinate (TOPROL XL) 50 MG 24 hr tablet Take 1 tablet (50 mg total) by mouth daily. 30 tablet 11 02/14/2022 at 2000    multivitamins, therapeutic with minerals 9 mg iron-400 mcg tablet Take 1 tablet by mouth daily. 130 tablet 1 Past Week    ondansetron (ZOFRAN-ODT) 8 MG disintegrating tablet Dissolve 1 tablet (8 mg total) by mouth every eight (8) hours as needed. 60 tablet 0 Past Month    polyethylene glycol (GLYCOLAX) 17 gram/dose powder Mix (17 g) in 4-8 ounces of liquid and drink it by mouth daily. 476 g 0 02/14/2022    prochlorperazine (COMPAZINE) 10 MG tablet Take 1 tablet (10 mg total) by mouth every six (6) hours as needed (breakthrough nausea or vomiting) for up to 8 days. 30 tablet 0     apixaban (ELIQUIS) 2.5 mg Tab Take 1 tablet (2.5 mg total) by mouth two (2) times a day.   02/08/2022    docusate sodium (COLACE) 100 MG capsule Take 1 capsule (100 mg total) by mouth two (2) times a day. 60 capsule 0 02/13/2022    LORazepam (ATIVAN) 0.5 MG tablet Take 2 tablets (1 mg total) by mouth Three (3) times a day.       pegfilgrastim (NEULASTA) 6 mg/0.8mL injection Inject 0.6 mL (6 mg total) under the skin every twenty-one (21) days. Administer 24-48 hours after completion of inpatient chemotherapy. 0.6 mL 1 01/21/2022    simethicone (MYLICON) 80 MG chewable tablet Chew 1 tablet (80 mg total) every six (6) hours as needed. 100 tablet 0 02/13/2022       Medical History:  No past medical history on file.    Surgical History:  Past Surgical History:   Procedure Laterality Date    CHG HYPERTHERMIA EXTERN RX DEEP N/A 02/15/2022    Procedure: HYPERTHERMIA, EXTERNALLY GENERATED; DEEP;  Surgeon: Marion Downer, MD;  Location: MAIN OR [facility];  Service: Surgical Oncology    EXPLORATORY LAPAROTOMY      PR APPENDECTOMY N/A 02/15/2022    Procedure: APPENDECTOMY;  Surgeon: Marion Downer, MD;  Location: MAIN OR [facility];  Service: Surgical Oncology    PR CHEMOTX ADMN PERTL CAVITY IMPLANTED PORT/CATH N/A 02/15/2022    Procedure: CHEMOTHERAPY ADMINISTRATION INTO THE PERITONEAL CAVITY VIA INDWELLING PORT OR CATHETER;  Surgeon: Marion Downer, MD;  Location: MAIN OR [facility];  Service: Surgical Oncology    PR COLOSTOMY N/A 02/15/2022    Procedure: COLOSTOMY OR SKIN LEVEL CECOSTOMY;  Surgeon: Marion Downer, MD;  Location: MAIN OR [facility];  Service: Surgical Oncology    PR EXCISION/DESTRUCTION OPEN ABDOMINAL TUMORS 5 CM N/A 02/15/2022  Procedure: EXCISION/DESTRUCTION, OPEN, INTRA-ABD TUMOR/CYST/ENDOMETRIOMA, 1+ PERITONEAL/MESENTERI/RETROPERIT 5CM OR <;  Surgeon: Marion Downer, MD;  Location: MAIN OR [facility];  Service: Surgical Oncology    PR EXPLORATORY OF ABDOMEN N/A 02/15/2022    Procedure: EXPLORATORY LAPAROTOMY, EXPLORATORY CELIOTOMY WITH OR WITHOUT BIOPSY(S);  Surgeon: Marion Downer, MD;  Location: MAIN OR [facility];  Service: Surgical Oncology    PR LAP,DIAGNOSTIC ABDOMEN N/A 02/15/2022    Procedure: LAPAROSCOPY, ABDOMEN, PERITONEUM, & OMENTUM, DIAGNOSTIC, W/WO COLLECTION SPECIMEN(S) BY BRUSHING OR WASHING;  Surgeon: Marion Downer, MD;  Location: MAIN OR [facility];  Service: Surgical Oncology    PR OOPH [facility] DISSECT FOR DEBULKING N/A 02/15/2022    Procedure: RESECTION (INITIAL) OVARIAN, TUBAL/PRIM PERITONEAL MALIG W/BIL S&O/OMENTECT; W/RAD DISSECTION FOR DEBULKING;  Surgeon: Carver Fila, MD;  Location: MAIN OR [facility];  Service: Gynecology Oncology    PR PART REMOVAL COLON W ANASTOMOSIS N/A 02/15/2022    Procedure: COLECTOMY, PARTIAL; WITH ANASTOMOSIS;  Surgeon: Marion Downer, MD;  Location: MAIN OR [facility];  Service: Surgical Oncology    PR REMOVAL GALLBLADDER N/A 02/15/2022    Procedure: CHOLECYSTECTOMY;  Surgeon: Marion Downer, MD;  Location: MAIN OR [facility];  Service: Surgical Oncology    PR REMOVAL OF OMENTUM N/A 02/15/2022    Procedure: OMENTECTOMY, EPIPLOECTOMY, RESECTION OF OMENTUM;  Surgeon: Marion Downer, MD;  Location: MAIN OR [facility];  Service: Surgical Oncology    PR REMOVAL SPLEEN, TOTAL N/A 02/15/2022    Procedure: SPLENECTOMY; TOT (SEPART PROC);  Surgeon: Marion Downer, MD;  Location: MAIN OR [facility];  Service: Surgical Oncology    TONSILLECTOMY  1996    TUBAL LIGATION Bilateral        Social History:  Tobacco use:   reports that she has never smoked. She has never used smokeless tobacco.  Alcohol use:   reports that she does not currently use alcohol.  Drug use:  reports no history of drug use.    Family History:  Family History   Problem Relation Age of Onset    Breast cancer Mother     Stroke Mother     Heart attack Mother     Diabetes Mother     Heart attack Father     Atrial fibrillation Father     Diabetes Father     Lymphoma Paternal Uncle     Heart attack Maternal Grandmother     Diabetes Paternal Grandmother     Heart attack Paternal Grandfather     Diabetes Paternal Grandfather         Review of Systems:  A 12 system review of systems was negative except as noted in HPI.    Objective  Vitals:    02/16/22 1400 02/16/22 1500 02/16/22 1600 02/16/22 1700   BP: 109/51 124/63 104/62 101/45   Pulse: 122 114 107 103   Resp: 26 21 22 21    Temp:       TempSrc:       SpO2: 93% 95% 94% 95%       I/O last 3 completed shifts:  In: 7896 [I.V.:4536.6; Blood:750; IV Piggyback:2609.3]  Out: 4915 [Urine:3390; Emesis/NG output:80; Drains:445; Blood:1000]    Physical Exam  Gen: NAD, resting comfortably, conversational  HEENT: NCAT, MMM  CV: tachycardic, regular rhythm  Resp: NWOB ORA  Abd: soft, mildly distended, mild LUQ TTP, drains w/ ss output, NGT in place w/ non-bilious output, ostomy pink with trace stool output  Neuro: alert and oriented    Continuous Infusions:    HYDROmorphone  10 mcg/mL bupivacaine 0.125% 5 mL/hr (02/16/22 1700)    lactated Ringers 100 mL/hr (02/16/22 1700)       Labs    Chemistry  Recent Labs     02/15/22  1653 02/15/22  2227 02/16/22  0350 02/16/22  0414   NA 141   < > 141 - 139 143   K 3.4   < > 3.9 - 3.5 4.0   CL 109*  --  112* 113*   CO2 18.0*  --  21.0 22.0   BUN 7*  --  5* <5*   GLU 217*  --  118* 119   MG 1.4*  --   --  2.4   PHOS 3.8  --   --  4.3    < > = values in this interval not displayed.       Hematology  Recent Labs     02/15/22  2106 02/16/22  0414   WBC 68.2* 66.9*   HGB 8.1* 7.4*   HCT 24.6* 22.3*   PLT 371 314       Hepatic Function  Recent Labs 02/15/22  1653 02/16/22  0350   AST 69* 29   ALT 40 25   BILITOT 1.8* 0.5     Recent Labs     02/15/22  1653 02/16/22  0350   PROT 4.5* 3.8*   ALBUMIN 2.6* 1.9*   ALT 40 25   AST 69* 29   ALKPHOS 65 54     No results for input(s): LABPROT, INR, APTT in the last 72 hours.      Patient Lines/Drains/Airways Status      Patient Lines/Drains/Airways Status       Active Active Lines, Drains, & Airways       Name Placement date Placement time Site Days    Power Port--a-Cath Single Hub 12/08/21 Right 12/08/21  1030  --  70    Port A Cath - Double Hub 01/07/22 Right Chest 01/07/22  --  -- 40    Closed/Suction Drain Left;Anterior LUQ Bulb 19 Fr. 02/15/22  1524  LUQ  1    Closed/Suction Drain Left;Anterior LLQ Bulb 19 Fr. 02/15/22  1523  LLQ  1    NG/OG Tube Decompression Left nostril 02/15/22  1900  Left nostril  less than 1    Colostomy RUQ 02/15/22  1600  RUQ  1    Urethral Catheter Non-latex;Straight-tip;Temperature probe 16 Fr. 02/15/22  0750  Non-latex;Straight-tip;Temperature probe  1    Epidural Catheter 02/15/22 02/15/22  0703  -- 1    Peripheral IV 02/15/22 Anterior;Left;Proximal Forearm 02/15/22  1600  Forearm  1                    Imaging  XR Abdomen 1 View    Result Date: 02/16/2022  EXAM: XR ABDOMEN 1 VIEW DATE: 02/16/2022 6:01 AM ACCESSION: [id number] UN DICTATED: 02/16/2022 8:48 AM INTERPRETATION LOCATION: MAIN CAMPUS CLINICAL INDICATION: 52 years old Female with NGT (CATHETER VASCULAR FIT & ADJ)  COMPARISON: Abdominal/pelvis CT 02/09/2022. TECHNIQUE: Supine views of the abdomen were obtained.  FINDINGS: Esophagogastric tube with tip and sideport projecting over the stomach. Surgical drains projecting over the right upper quadrant and pelvis. Thoracic epidural catheter overlying the midline chest. Nonobstructive bowel gas pattern. No overt free air, although limited evaluation on this non-upright examination. Blunted left costophrenic angle.     Esophagogastric tube with tip and sideport projecting over the stomach.      Concha Pyo,  MD, PhD  General Surgery PGY-1

## 2022-02-16 NOTE — Unmapped (Signed)
Pt admitted to SICU from OR. A + O x 4, follows commands on room air. Nausea and pain controlled via PRN and scheduled medications. ST, afebrile, normotensive. Epidural in place. Ostomy clean and intact. 2 Jps stripped. Q1 is/os pt producing adequate urine. See flowsheets for more information.  Problem: Latex Allergy  Goal: Absence of Allergy Symptoms  Outcome: Progressing     Problem: Wound  Goal: Optimal Coping  Outcome: Progressing  Goal: Optimal Functional Ability  Outcome: Progressing  Goal: Absence of Infection Signs and Symptoms  Outcome: Progressing  Goal: Improved Oral Intake  Outcome: Progressing  Goal: Optimal Pain Control and Function  Outcome: Progressing  Goal: Skin Health and Integrity  Outcome: Progressing  Intervention: Optimize Skin Protection  Recent Flowsheet Documentation  Taken 02/15/2022 1800 by Jaquita Folds, RN  Pressure Reduction Techniques: frequent weight shift encouraged  Head of Bed [facility]) Positioning: HOB at 30 degrees  Taken 02/15/2022 1715 by Jaquita Folds, RN  Head of Bed [facility]) Positioning: HOB at 30 degrees  Goal: Optimal Wound Healing  Outcome: Progressing     Problem: Adult Inpatient Plan of Care  Goal: Plan of Care Review  Outcome: Progressing  Goal: Patient-Specific Goal (Individualized)  Outcome: Progressing  Goal: Absence of Hospital-Acquired Illness or Injury  Outcome: Progressing  Intervention: Identify and Manage Fall Risk  Recent Flowsheet Documentation  Taken 02/15/2022 1800 by Jaquita Folds, RN  Safety Interventions:   aspiration precautions   bleeding precautions   low bed  Intervention: Prevent Skin Injury  Recent Flowsheet Documentation  Taken 02/15/2022 1800 by Jaquita Folds, RN  Positioning for Skin: Left  Goal: Optimal Comfort and Wellbeing  Outcome: Progressing  Goal: Readiness for Transition of Care  Outcome: Progressing  Goal: Rounds/Family Conference  Outcome: Progressing     Problem: Self-Care Deficit  Goal: Improved Ability to Complete Activities of Daily Living  Outcome: Progressing

## 2022-02-16 NOTE — Unmapped (Signed)
Patient reporting increased abdominal pain since epidural rate decreased this morning. BP seems to have improved. Received PRN dilaudid with some relief, but does not like how it makes her feel. Given improvement in BP at this time, will try an incremental increase in epidural rate to 5 ml/hr. A PCEA bolus was also administered with the rate increase.    Stevphen Meuse, MD  PGY 4 [facility] Anesthesiology

## 2022-02-16 NOTE — Unmapped (Signed)
Surgery Progress Note    Hospital Day: 2    Assessment:     Debra Ponce is a 52 y.o. 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 She underwent an exploratory laparotomy, low anterior resection with diverting loop ileostomy, TAH and BSO, splenectomy, cholecystectomy, and appendectomy and received cisplatin intraoperatively.    Interval Events:     NAE. Received 1L of LR and of albumin overnight for MAPS < 60, but UOP adequate and lactate normal. Had some abdominal pain but reports that it is well controlled. Initially had nausea and vomiting post-operatively that has since resolved.     Plan:   Postoperative pain management:  - Epidural in place, APS is managing  - Reducing continuous rate from 21ml/hr to 53ml/hr given MAPs    CV: Hypotension with stable tachycardia. Likely secondary to volume depletion from open abdomen intraoperatively and distributive secondary to the epidural.   - Resuscitation PRN per SICU team    Pulm:   - Post-operative O2 requirement: Room air. Encouraging IS and ambulation today.     Fluids: LR @ 100  Electrolytes: No clinically significant electrolyte abnormalities  Nutrition: NPO    GI: S/p cholecystectomy, LAR with DLI  - WOCN consult for new ileostomy  - Continue NPO with NGT to LIWS for today. Will trend output given propensity for ileus after HIPEC.  - Scopolamine for nausea    Endo:   - D10 PRN for hypoglycemia    Renal/GU:   - Foley in place given perioperative needs. 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 ID: No acute concerns    Ppx: SQH 5000q8 while epidural in place    Dispo  - Current level of care: ICU  - Patient will need ostomy teaching and supplies prior to discharge     Objective:      Vital Signs:  BP 101/54  - Pulse 102  - Temp 36.8 ??C (98.2 ??F) (Oral)  - Resp 16  - LMP 11/14/2020 (Approximate)  - SpO2 93%     Input/Output:  I/O         01/01 0701  01/02 0700 01/02 0701  01/03 0700 01/03 0701  01/04 0700    I.V.  4536.6 300    Blood  750     IV Piggyback  2609.3 176.7    Total Intake  7896 476.7    Urine  3390 625    Emesis/NG output  80     Drains  445 145    Stool  0     Blood  1000     Total Output  4915 770    Net  +2981 -293.3           Stool Occurrence  0 x             Physical Exam:    General: Cooperative, no distress, well appearing  HEENT: Normocephalic, atraumatic  Pulmonary: Normal work of breathing, equal bilateral chest rise  Cardiovascular: Regular tachycardia  Abdomen: Soft, appropriately tender, mildly distended. Incisions c/d/i with dermabond in place. Drains with s/s output. Ileostomy is pink, bowel sweat in the bag.  Extremities: Minimal peripheral edema    Labs:    Lab Results   Component Value Date    WBC 66.9 (HH) 02/16/2022    HGB 7.4 (L) 02/16/2022    HCT 22.3 (L) 02/16/2022    PLT 314 02/16/2022       Lab Results   Component Value Date    NA 143 02/16/2022    K 4.0 02/16/2022    CL 113 (H) 02/16/2022    CO2 22.0 02/16/2022    BUN <5 (L) 02/16/2022    CREATININE 0.50 (L) 02/16/2022    CALCIUM 7.6 (L) 02/16/2022    MG 2.4 02/16/2022    PHOS 4.3 02/16/2022       Microbiology Results (last day)       Procedure Component Value Date/Time Date/Time    Amylase, body fluid [[id number]] Collected: 02/16/22 0350    Lab Status: Final result Specimen: Fluid, Peritoneal Updated: 02/16/22 0451     Amylase, Fluid 214 U/L      Comment: Peritional fluid amylase >3-5X serum amylase suggests pancreatic ascities.        Amylase, Fluid Type Fluid, Peritoneal    Narrative:      The reference range for this body fluid has not been established. The test result must be integrated into the clinical context for interpretation.  This test was developed and its performance characteristics determined by the [facility] of the [employer], [employer]. This test has not been cleared or approved by the FDA. The laboratory is regulated under CAP and CLIA as qualified to perform high-complexity testing. This test is to be used for clinical purposes and should not be regarded as investigational or for research.            Imaging:   XR Abdomen 1 View    Result Date: 02/16/2022  EXAM: XR ABDOMEN 1 VIEW DATE: 02/16/2022 6:01 AM ACCESSION: [id number] UN DICTATED: 02/16/2022 8:48 AM INTERPRETATION LOCATION: MAIN CAMPUS     CLINICAL INDICATION: 52 years old Female with NGT (CATHETER VASCULAR FIT & ADJ)      COMPARISON: Abdominal/pelvis CT 02/09/2022.     TECHNIQUE: Supine views of the abdomen were obtained.      FINDINGS: Esophagogastric tube with tip and sideport projecting over the stomach. Surgical drains projecting over the right upper quadrant and pelvis. Thoracic epidural catheter overlying the midline chest. Nonobstructive bowel gas pattern. No overt free air, although limited evaluation on this non-upright examination. Blunted left costophrenic angle.         Esophagogastric tube with tip and sideport projecting over the stomach.

## 2022-02-16 NOTE — Unmapped (Signed)
Department of Anesthesiology  Pain Division  Acute Pain Pager [phone]        Acute Pain Service Follow-Up Note- Epidural      Assessment and Plan  Patient is a 52 y.o. s/p cytoreductive surgery and HIPEC who is now POD# 1 currently with an epidural catheter for postoperative pain.    Interval updates:   Reports pain is relatively well controlled with epidural. Has required 1 PRN medication dosage overnight. MAPs have been low despite fluid resuscitation. Remains off pressors at present.     Catheter status/changes: Will adjust epidural: Epidural rate changed to 4 mL/hr this morning.  Will continue to follow and adjust as tolerated.  Blood thinner: heparin 5000u SQ  Additional pain/sedation medications: Being managed by the Acute Pain Service.  See changes below   Current diet:   Active Orders   Diet    NPO No Exceptions; Medically necessary       Recommendations:  - Continue 0.125% bupivacaine with hydromorphone epidural at 4 ml/hr  - Continue scheduled IV tylenol  - Continue PRN IV dilaudid    We will continue to follow.  Contact the Acute Pain Service first at [phone] with questions or concerns 24 hours a day.  If unable to reach, please contact on call Anesthesiology resident at [phone].     If applicable, naloxone available for sedation or respiratory depression.  If applicable, nalbuphine available for refractory pruritis.  Call APS for additional concerns.  No anesthesia/epidural need for foley catheter.  Contact APS before starting any anticoagulant other than [place] heparin.    CPT  daily management of epidural 21308   Diagnosis: abdominal pain      History  Patient is a 52 y.o. s/p cytoreductive surgery and HIPEC on 02/15/22 by Dr. Pricilla Holm and Vanessa Ralphs.    Daily Hospital Course  There were Acute Events Overnight involving low blood pressures despite fluid resuscitation.   The patient is obtaining adequate pain relief on current medication regimen and feels that their pain is Controlled    Epidural Catheter: Currently running  Level: T8/9  LOR:   4.5 cm  Catheter at skin:  9.5 cm  Catheter Start Date: 02/15/22  Postop/Catheter Day: 1    Analgesia Evaluation:  Pain at rest: 0/10  Pain with activity: 4/10    Infusion: 0.125% bupivacaine with 55mcg/ml hydromorphone  Continuous Infusion Rate: 41ml/hour  PCEA Settings: 2ml every 30  minutes    Additional opioid and non-opioid analgesics:   IV acetaminophen 650 mg q6h  IV hydromorphone 0.5-1 mg PRN    Complications:  Pruritis: no  Urinary Retention: urinary catheter  Nausea: No  Vomiting: No  Sedation: No  Respiratory Depression: No    Physical Exam  Temp:  [36.1 ??C (97 ??F)-36.8 ??C (98.2 ??F)] 36.8 ??C (98.2 ??F)  Core Temp:  [36.4 ??C (97.5 ??F)-37.1 ??C (98.8 ??F)] 36.7 ??C (98.1 ??F)  Heart Rate:  [99-116] 113  SpO2 Pulse:  [99-117] 113  Resp:  [13-26] 23  BP: (91-128)/(36-89) 103/43  MAP (mmHg):  [53-101] 60  A BP-1: (78-142)/(37-83) 112/43  MAP:  [56 mmHg-92 mmHg] 64 mmHg  SpO2:  [92 %-97 %] 94 %  GEN: Patient laying in bed, in minimum distress.  NG is present.  CHEST: Unlabored breathing, supplemental oxygen is not present.  Chest tube is not present.  SKIN: epidural catheter dressing clean, dry, intact, no erythema at insertion site, no fluctuance or tenderness noted  GENITOURINARY: foley catheter is present  NEUROLOGIC: patient moves  all extremities, 5/5 strength in lower extremities, alert and oriented to person, place and time; other deficits noted: None    Lab Results   Component Value Date    WBC 66.9 (HH) 02/16/2022    RBC 2.72 (L) 02/16/2022    HGB 7.4 (L) 02/16/2022    HCT 22.3 (L) 02/16/2022    MCV 81.9 02/16/2022    MCH 27.4 02/16/2022    MCHC 33.5 02/16/2022    RDW 17.7 (H) 02/16/2022    PLT 314 02/16/2022    MPV 6.6 (L) 02/16/2022     Lab Results   Component Value Date    PT 15.7 (H) 01/09/2022    INR 1.42 01/09/2022     Lab Results   Component Value Date    CREATININE 0.50 (L) 02/16/2022

## 2022-02-16 NOTE — Unmapped (Addendum)
LAPAROSCOPY, ABDOMEN, PERITONEUM, & OMENTUM, DIAGNOSTIC, W/WO COLLECTION SPECIMEN(S) BY BRUSHING OR WASHING, EXCISION/DESTRUCTION, OPEN, INTRA-ABD TUMOR/CYST/ENDOMETRIOMA, 1+ PERITONEAL/MESENTERI/RETROPERIT 5CM OR <, COLECTOMY, PARTIAL; WITH ANASTOMOSIS, COLOSTOMY OR SKIN LEVEL CECOSTOMY, SPLENECTOMY; TOT (SEPART PROC), EXPLORATORY LAPAROTOMY, EXPLORATORY CELIOTOMY WITH OR WITHOUT BIOPSY(S), HYPERTHERMIA, EXTERNALLY GENERATED; DEEP, CHEMOTHERAPY ADMINISTRATION INTO THE PERITONEAL CAVITY VIA INDWELLING PORT OR CATHETER, CHOLECYSTECTOMY, APPENDECTOMY, OMENTECTOMY, EPIPLOECTOMY, RESECTION OF OMENTUM  Operative Note (CSN: [id number])    Service    Date of Surgery: 02/15/2022  Admit Date: 02/15/2022  Performing Service: Surgical Oncology  Surgeon(s) and Role:  Panel 1:     * Marion Downer, MD - Primary     * Ardine Eng, MD - Resident - Assisting  Panel 2:     * Carver Fila, MD - Primary     * Frame, Benard Halsted, MD - Resident - Assisting     * Eliot Ford, MD - Fellow - Surgical    Operative Note    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  LAPAROSCOPY, ABDOMEN, PERITONEUM, & OMENTUM, DIAGNOSTIC, W/WO COLLECTION SPECIMEN(S) BY BRUSHING OR WASHING  EXCISION/DESTRUCTION, OPEN, INTRA-ABD TUMOR/CYST/ENDOMETRIOMA, 1+ PERITONEAL/MESENTERI/RETROPERIT 5CM OR <  COLECTOMY, PARTIAL; WITH ANASTOMOSIS  COLOSTOMY OR SKIN LEVEL CECOSTOMY  SPLENECTOMY; TOT (SEPART PROC)  EXPLORATORY LAPAROTOMY, EXPLORATORY CELIOTOMY WITH OR WITHOUT BIOPSY(S)  HYPERTHERMIA, EXTERNALLY GENERATED; DEEP  CHEMOTHERAPY ADMINISTRATION INTO THE PERITONEAL CAVITY VIA INDWELLING PORT OR CATHETER  RESECTION (INITIAL) OVARIAN, TUBAL/PRIM PERITONEAL MALIG W/BIL S&O/OMENTECT; W/RAD DISSECTION FOR DEBULKING  CHOLECYSTECTOMY  APPENDECTOMY  OMENTECTOMY, EPIPLOECTOMY, RESECTION OF OMENTUM    Findings: Diagnostic laparoscopy limited due to adhesions.  Laparotomy performed.  Large pelvic mass ~20cm in size originating from left ovary.  Sigmoid involve and entered while attempting to separate it from the mass.  This was repaired with a temporary closure.  Right ovary also involved with tumor ~10cm in largest dimension.  Multiple cystic and solid masses (accumulating to 5x5cm) in the posterior cul de sac resected by performing pelvic peritonectomy en bloc with low anterior resection including area that was repaired.  Air leak test performed - no bubbles.  ~7x5cm mass at the hepatic flexure resected.  Right side wall peritonectomy (5cmx3cm).  Omentectomy including multiple 1-2cm nodules.  Two large masses along right diaphragm: one 5x7cm and another 12x10cm pushing into the liver, requiring removal of the liver capsule to resect. 5cm mass adherent to spleen - splenectomy performed.  Cholecystectomy and appendectomy performed to prevent having to reoperate in the future.  Resection and ablation of multiple small bowel and large bowel mesenteric nodules. Diverting loop ileostomy performed to protect low anastomosis.  2x19F blake drains placed. One in the pelvic and the other in the left upper quadrant.    Peritoneal cancer index  0 Central (greater omentum, transverse mesocolon) = 3  1 Right upper (right diaphragm, right hepatic lobe) = 3  2 Epigastrium (lesser omentum, hepatic left lobe) = 1  3 Left upper (left diaphragm, spleen, stomach, pancreatic tail) = 3  4 Left flank (descending colon, left paracolic gutter) = 1  5 Left lower (sigmoid colon) = 3  6 Pelvis (distal sigmoid colon, bladder, female genitalia) = 3  7 Right lower (cecum, appendix) = 3  8 Right flank (ascending colon, right paracolic gutter) = 2  9 Upper jejunum = 1  10 Lower jejunum = 1  11  Upper ileum = 1  12 Lower ileum= 1    Total 26/39  Anesthesia: General    Estimated Blood Loss: 1000 mL    Complications: sigmoid colon enterotomy repaired immediately intraoperatively while removing large pelvic mass     Specimens:   ID Type Source Tests Collected by Time Destination   1 : Abdominal fat pad Tissue Abdomen SURGICAL PATHOLOGY EXAM Marion Downer, MD 02/15/2022 [phone]    2 : Left adnexa Tissue Abdomen SURGICAL PATHOLOGY EXAM Marion Downer, MD 02/15/2022 [phone]    3 : right adnexa Tissue Abdomen SURGICAL PATHOLOGY EXAM Marion Downer, MD 02/15/2022 [phone]    4 : Left perimetrium Tissue Uterus SURGICAL PATHOLOGY EXAM Carver Fila, MD 02/15/2022 [phone]    5 : Uterus and cervix Tissue Uterus SURGICAL PATHOLOGY EXAM Marion Downer, MD 02/15/2022 1018    6 : Low Anterior Resection Tissue Abdomen SURGICAL PATHOLOGY EXAM Marion Downer, MD 02/15/2022 1055    7 : Right peritoneum Tissue Abdomen SURGICAL PATHOLOGY EXAM Marion Downer, MD 02/15/2022 1058    8 : Right retroperitoneal mass Tissue Abdomen SURGICAL PATHOLOGY EXAM Marion Downer, MD 02/15/2022 1101    9 : Omentum Tissue Omentum SURGICAL PATHOLOGY EXAM Marion Downer, MD 02/15/2022 1130    10 : Gallbladder Tissue Gallbladder SURGICAL PATHOLOGY EXAM Marion Downer, MD 02/15/2022 1143    11 : Right diaphragm Tissue Diaphragm  SURGICAL PATHOLOGY EXAM Marion Downer, MD 02/15/2022 1143    12 : Left diaphragm nodule Tissue Diaphragm  SURGICAL PATHOLOGY EXAM Marion Downer, MD 02/15/2022 1157    13 : Spleen Tissue Spleen SURGICAL PATHOLOGY EXAM Marion Downer, MD 02/15/2022 1204    14 : Appendix Tissue Appendix SURGICAL PATHOLOGY EXAM Marion Downer, MD 02/15/2022 1213    15 : Small bowel mesenteric nodules Tissue Small Bowel SURGICAL PATHOLOGY EXAM Marion Downer, MD 02/15/2022 1215    16 : Colon mesenteric nodule Tissue Colon SURGICAL PATHOLOGY EXAM Marion Downer, MD 02/15/2022 1221    17 : Hepatic flexure mass Tissue Liver SURGICAL PATHOLOGY EXAM Marion Downer, MD 02/15/2022 1227 18 : Anvil and donut Tissue Rectum SURGICAL PATHOLOGY EXAM Marion Downer, MD 02/15/2022 1504      PREOPERATIVE DETAILS  Primary origin of carcinomatosis: Ovary  Synchronous or Metachronous Carcinomatosis: Synchronous  Preoperative imaging: Y  Preoperative Chemotherapy: Y  ECOG Perforamance status: 1  ASA Class: 2    PREINCISION DETAILS:  Anesthesia: General   Preoperative DVT Prophylaxis: none  Preoperative antibiotics: Y type: Ancef/Flagyl  Preoperative ureteral stents: N    EXPLORATION AND RESECTION:   Laparoscopy before exploration: Y  PCI Index: 26  Ascites present: N Volume: 0  Peritonectomy sites: right abdominal wall, pelvis, partial right diaphragm  Organs resected: colon, spleen, omentum, uterus, ovaries, gallbladder, appendix  Number of anastomoses: 1  Loop or end stoma: loop ileostomy  8. 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  Underwent 2 cycles of BEP with stable/decreased residual disease, now presents for cytoreductive surgery and possible hyperthermic intraperitoneal chemotherapy with cisplatin.    Procedures:   Diagnostic laparoscopy  Laparotomy  Bilateral salpingo-oopherectomy and hysterectomy (see Dr. Winferd Humphrey op note for details)  Repair of enterotomy  Pelvic Peritonectomy (tumors accumulated to 5x5cm)  Low anterior resection  Omentectomy  Resection of hepatic flexure mass (~7x5cm in size)  Right side wall peritonectomy (5cmx3cm)  Right diaphragm mass excision x2 (one 5x7cm and another 12x10cm pushing into liver and involving liver capsule)  Cholecystectomy  Splenectomy  Appendectomy  Resection of multiple small bowel mesenteric nodules (all <0.5cm)  Ablation of multiple small bowel mesenteric nodules (<0.5cm)  Ablation of retroperitoneal plaque at root of the small bowel mesentery (~2cm in size)  Heated intraperitoneal chemotherapy with cisplatin  Diverting loop ileostomy    Description of Procedure:  The patient underwent placement of an epidural catheter in the preoperative holding area, and was brought into the operating room and placed in lithotomy position with both arms out.  The patient underwent general endotracheal anesthesia and a Foley catheter was placed.  SCD boots were used throughout the case.  The abdomen was prepped and draped in the usual fashion with ioband overlying the drapes.     Diagnostic laparoscopy   We began with a diagnostic laparoscopy.  A stab incision was made with an 11 scalpel blade in the left upper quadrant and an optical view trocar with a 5mm zero degree laparoscopic camera used to enter the abdomen.  Pneumoperitoneum was established to a pressure of 15mm Hg.  A 30-degree laparoscopic camera was inserted and the abdomen explored.  There was no evidence of injury to the intraabdominal contents.  The diagnostic laparoscopy was limited due to dense adhesions at the midline and in the right upper quadrant. There was no ascites.  There was no miliary disease of the mesentery of the small bowel. It was not possible to assess the extent of disease in the pelvis, but there were no other obvious impediments to proceeding, so we proceeded with the laparotomy.     Exploratory laparotomy  A midline incision was made using a 10 scalpel extending from the mid-upper abdomen to just above the pubis. The anterior preperitoneal fat pad was dissected out and excised including the falciform fat pad.  A large wound protector was placed. The Thompson retractor was used to gain exposure into the abdomen.     We explored the pelvis and noted multiple sites of disease disease.  In order to properly assess the peritoneum, the large pelvis mass was removed with gynecologic oncology (please see Dr. Winferd Humphrey op note for details).  During the resection of the mass, an enterotomy was identified in the sigmoid colon.  This was repaired to contain any enteric contents.       Multiple cystic and solid masses (accumulating to 5x5cm) in the posterior cul de sac were identified.  ~7x5cm mass at the hepatic flexure was seen.  Right side wall had multiple small nodules (covering an area of 5cmx3cm).  Omentum contained multiple 1-2cm nodules.  Two large masses along right diaphragm: one 5x7cm and another 12x10cm pushing into the liver. 5cm mass adherent to spleen.  We ran the entire small bowel and noted multiple <0.5cm scattered disease along the mesentery of the small bowel.      Pelvic peritonectomy and Low Anterior Resection -This procedure was not performed to treat colon cancer through resection  Using electrocautery and the Ligasure, the left colon was freed both from its peritoneal attachments.  Both ureters were identified and protected. The bilateral pelvic peritoneum including the peritoneum of the posterior cul de sac below the vaginal cuff was dissected with electrocautery and blunt dissection with a sponge stick until the peritoneum met the rectum.  The mesentery of the rectum was taken with the ligasure. Points of transection were selected proximally on the left colon proximal to the enterotomy and distally on the rectum below the peritoneal reflection.  The bowel was divided proximally using the 80 GIA blue load and distally with the contour 60 purple. The peritoneum overlying the mesentery was then scored with electrocautery and divided using the Ligasure.   The specimen which included the pelvic peritonectomy and low anterior resection was removed and sent to pathology.  Hemostasis was checked in the operative field. The two ends of the bowel were checked and found to be viable, with excellent blood supply.     Resection/Destruction of tumor  Two large masses along right diaphragm: one 5x7cm and another 12x10cm pushing into the liver, requiring removal of the liver capsule to resect.  This was performed with a combination of blunt and sharp dissection including use of ligasure.  Multiple other masses were removed.  Multiple cystic and solid masses (accumulating to 5x5cm) in the posterior cul de sac were resected by performing pelvic peritonectomy en bloc with low anterior resection including area that was repaired.  ~7x5cm mass at the hepatic flexure resected.  Right side wall peritonectomy (5cmx3cm).   Resection and ablation of multiple small bowel and large bowel mesenteric nodules was performed with a combination of sharp dissection and electrocautery.     Appendectomy  The appendix was abnormal and thickened.  A plane was dissected between the base of the appendix mesocolon and the cecum.  The mesoappendix was taken with the LigaSure.  The base of the appendix was tied with 2x 2-0 ties.  The specimen was removed and sent to pathology.  The stump of the appendix was dunked into the cecum with a figure of eight 3-0 vicryl stitch.  Hemostasis was checked in the operative field.     Cholecystectomy  We turned our attention to the gallbladder.  Traction was placed on the fundus of the gallbladder.  The cystic artery and cystic duct were identified in Calot's triangle.  These were identified and ligated, and clipped.  The cystic artery was divided.  The peritoneum overlying the gallbladder was incised circumferentially from cephalad to caudad, and the gallbladder shelled out of its bed from the top down until the structures of Calot's triangle could be delineated with certainty. The cystic duct was ligated with 3-0 silk and divided and gallbladder removed. Hemostasis was checked in the gallbladder fossa and assured using cautery.    Omentectomy  We then performed an omentectomy, separating the greater omentum from the greater curvature of the stomach and the transverse colon using the Ligasure. We explored the lesser sac and did not note any evidence of disease overlying the pancreas or involving the porta.     Splenectomy  As there was 5cm of tumor attached to the posterior spleen, the lateral and posterior attachment of the spleen were taken with a combination of electrocautery and the Ligasure.  The splenic vein and artery along with the tip of the pancreas were stapled a single load of the re-enforced purple endo-GIA stapler.  The spleen was taken off the field and sent  to pathology.    Diaphragmatic stripping As described above, disease noted on both Right and Left diaphragm.  Two large masses along right diaphragm: one 5x7cm and another 12x10cm pushing into the liver, requiring removal of the liver capsule to resect.  Small volume disease was seen on the left diaphragm (<0.5cm). Disease was excised using loop cautery and ring forceps.      The PCI was 26.   A complete cytoreduction had been performed with CC-0.     HIPEC  We decided to proceed with hyperthermic intraperitoneal chemotherapy with cisplatin.    The inflow and outflow catheters and temperature probes were placed into the abdomen.    The skin was closed with a running and locking 2-0 Nylon suture.   The circuit was then connected to the Thermasolutions machine and hyperthermic intraperitoneal chemotherapy instituted per protocol which generated heat at a depth of >4cm into the patient's abdomen.    We maintained a outflow temperature of 40 - 42 degrees while ensuring patient's temperature stayed below 39 degrees.     CISPLATIN: A total of 140mg  (100mg /m2) of cisplatin was circulated over 90 minutes. A sodium thiosulfate bolus was begun 30 minutes before the cisplatin perfusion and an infusion of sodium thiosulfate was run for 6 hours following the bolus dose.    At the end of the cycle, the circuit was washed out with 2L normal saline.      The catheters and temperature probes were removed.  There was no damage to the intraabdominal contents.  The abdomen was irrigated with 2L sterile water.  The entire small bowel was run again - no areas of concern were noted.  The pelvis was again examined.  No areas of weakness were noted on the bladder or rectum     Restoration of bowel continuity  We then focused on restoration of bowel continuity. For the low anterior resection, a 28 EEA stapler anvil was inserted into the sigmoid colon end and a pursestring was tied to secure the anvil in place.  The EEA stapler was inserted into the rectum and the vagina was noted to be a separate entry anteriorly.  The anvil was then connected to the EEA spike.  The stapler was closed and fired.  The donuts were examined and were found to be intact.  An airleak test was performed which showed no air bubbles.     The abdomen was irrigated with normal saline after the anastomosis created. The entire small bowel was run again.      Due to having poor nutrition preoperatively and having a low anastomosis, a diverting loop ileostomy was created.  This was done by resecting a small piece of skin and subcutaneous tissue anterior to the right rectus.  Dissection was carried down to the fascia. Cruciate incisions in the fascia were made with electrocautery.  The rectus muscles were split bluntly and the posterior fascia entered. Two finger breadths of space was created for the ileostomy.  A babcock was used to pull the ileostomy through the skin.      Hemostasis was again assured. A 7F blake drain was placed in the left upper quadrant and a second in the pelvis.  The fascia was then closed with Number 1 looped PDS sutures.      The deep dermis was closed with 3-0 vicryl and the skin with 4-0 monocryl and dermabond.   The patient tolerated the procedure well, was extubated in the operating room prior to transport to the  SICU.    At the end of the procedure, all sponge counts and needle counts were correct.     Teaching Surgeon  Attestation:  I was present throughout the entire case from incision to closure.    Surgeon Notes: I was present and scrubbed for the entire procedure    Tommie Raymond, MD   Date: 02/15/2022  Time: 5:41 PM

## 2022-02-16 NOTE — Unmapped (Signed)
[facility] Consult Services  OSTOMY VISIT NOTE     Reason for Consult:   - Initial  - Ostomy Teaching    Problem List:   Principal Problem:    Malignant germ cell tumor of ovary (CMS-HCC)    Assessment: Per EMR, Debra Ponce is a 52 y.o. 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 She underwent an exploratory laparotomy, low anterior resection with diverting loop ileostomy, TAH and BSO, splenectomy, cholecystectomy, and appendectomy and received cisplatin intraoperatively.     [name] was seen in the STICU.  NG tube in place and patient had just taken pain medicine and did not want any teaching. Husband at the bedside. 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     Stoma Type:  -  Ileostomy Stoma Location:  - RLQ (Right Lower Quadrant)     Stoma Characteristics:  - Round  - Budded Stoma Mucosal Condition and Color:  - Moist  - Edematous  - Red     Mucocutaneous Junction:  - Not able to assess at this time, pouch not removed    Rod/Stents:  - No     Output:  - Brown  - Liquid  - Stool     Peristomal Skin Condition:   - Not able to assess at this time, pouch not removed    Abdominal Contours:  - Soft    Pouching System:  - 2 Piece  - Flat  - CTF (Cut to fit) Anticipated Wear Time of Pouching System:  - To be determined     Teaching Limitations/Considerations:   - Anxiety  - Drowsy    Teaching/Instructions:  - Discussed agreed upon goals that the patient/family member would be able to empty and change ostomy pouch, have the instructions needed to order home supplies and know how to trouble shoot pouch leakage and common peristomal skin problems.    Ostomy Home Starter Kit - Verbal Consent Obtained:  - Not asked    Recommendations/Plan:   - Patient will need more ostomy teaching prior to discharge, WOC nurse will continue to follow.  - WOC nurse will follow up in 1 days for pouch evaluation.  - Unit to order discharge ostomy supplies.  - If pouch leaks, Change with bedside supplies.  - Pending discharge ostomy supply list.    Ostomy Discharge Goals:  - Patient/family member able to empty pouch independently.  - Patient/family member able to change pouch independently.  - Patient/family member able to identify ostomy supplies to take home at discharge and knowledge of supply order process.     Recommended Consults:   - Not Applicable Plan of Care Discussed With:  - Patient  - Family  - RN [name]     Ostomy Supplies:   - Supplies available on unit.  - Unit to order.  - Patient specific supplies at bedside.    Ostomy Product List:  Inpatient supply list: take home quantity listed     Pouch System:  [employer] 2-Piece Soft Convex Wafer Red (mfg [id number]) - lawson number [phone]) order 5  [employer] 2-Piece Fecal Pouch Red (mfg [id number]) - lawson number [phone]) order 5    Accessories:   [employer] (mfg [id number])- lawson number [phone]) order 5  [employer]enta ConvaTec Adhesive Remover Wipes (mfg [id number])- lawson number [phone]) order 1 box  [employer]ister Ostomy Belt 23-43 (mfg 7300) - lawson number [phone]) order 1  7M No-Sting Barrier Film- Spray (mfg 3346) - lawson number [phone]) order 1     [facility] Follow-up:  - Follow up on 1/4 to assess for teaching appropriateness    Workup Time:  30 minutes    Cherylann Ratel MA BSN RN [facility]  [facility] Consult Service  Epic Chat or [phone]

## 2022-02-17 LAB — COMPREHENSIVE METABOLIC PANEL
ALBUMIN: 2 g/dL — ABNORMAL LOW (ref 3.4–5.0)
ALKALINE PHOSPHATASE: 78 U/L (ref 46–116)
ALT (SGPT): 18 U/L (ref 10–49)
ANION GAP: 7 mmol/L (ref 5–14)
AST (SGOT): 22 U/L (ref <=34)
BILIRUBIN TOTAL: 0.4 mg/dL (ref 0.3–1.2)
BLOOD UREA NITROGEN: 6 mg/dL — ABNORMAL LOW (ref 9–23)
BUN / CREAT RATIO: 10
CALCIUM: 8.4 mg/dL — ABNORMAL LOW (ref 8.7–10.4)
CHLORIDE: 113 mmol/L — ABNORMAL HIGH (ref 98–107)
CO2: 24 mmol/L (ref 20.0–31.0)
CREATININE: 0.63 mg/dL
EGFR CKD-EPI (2021) FEMALE: >90 mL/min/{1.73_m2} (ref >=60)
GLUCOSE RANDOM: 79 mg/dL (ref 70–99)
POTASSIUM: 3.7 mmol/L (ref 3.4–4.8)
PROTEIN TOTAL: 4.4 g/dL — ABNORMAL LOW (ref 5.7–8.2)
SODIUM: 144 mmol/L (ref 135–145)

## 2022-02-17 LAB — CBC
HEMATOCRIT: 21.3 % — ABNORMAL LOW (ref 34.0–44.0)
HEMOGLOBIN: 7.1 g/dL — ABNORMAL LOW (ref 11.3–14.9)
MEAN CORPUSCULAR HEMOGLOBIN CONC: 33.3 g/dL (ref 32.0–36.0)
MEAN CORPUSCULAR HEMOGLOBIN: 27.3 pg (ref 25.9–32.4)
MEAN CORPUSCULAR VOLUME: 82.1 fL (ref 77.6–95.7)
MEAN PLATELET VOLUME: 6.8 fL (ref 6.8–10.7)
PLATELET COUNT: 379 10*9/L (ref 150–450)
RED BLOOD CELL COUNT: 2.6 10*12/L — ABNORMAL LOW (ref 3.95–5.13)
RED CELL DISTRIBUTION WIDTH: 17.7 % — ABNORMAL HIGH (ref 12.2–15.2)
WBC ADJUSTED: 53.1 10*9/L (ref 3.6–11.2)

## 2022-02-17 LAB — MAGNESIUM: MAGNESIUM: 2.1 mg/dL (ref 1.6–2.6)

## 2022-02-17 LAB — LIPASE: LIPASE: 19 U/L (ref 12–53)

## 2022-02-17 LAB — AMYLASE: AMYLASE: 28 U/L — ABNORMAL LOW (ref 30–118)

## 2022-02-17 LAB — PHOSPHORUS: PHOSPHORUS: 3 mg/dL (ref 2.4–5.1)

## 2022-02-17 MED ADMIN — acetaminophen (OFIRMEV) 10 mg/mL injection 650 mg 65 mL: 650 mg | INTRAVENOUS | @ 22:00:00 | Stop: 2022-02-18

## 2022-02-17 MED ADMIN — HYDROmorphone (PF) injection Syrg 0.25 mg: .25 mg | INTRAVENOUS | @ 08:00:00 | Stop: 2022-03-01

## 2022-02-17 MED ADMIN — HYDROmorphone 10 mcg/mL bupivacaine 0.125% epidural: 6 mL/h | EPIDURAL | @ 06:00:00 | Stop: 2022-02-17

## 2022-02-17 MED ADMIN — heparin (porcine) 5,000 unit/mL injection 5,000 Units: 5000 [IU] | SUBCUTANEOUS | @ 22:00:00

## 2022-02-17 MED ADMIN — acetaminophen (OFIRMEV) 10 mg/mL injection 650 mg 65 mL: 650 mg | INTRAVENOUS | @ 03:00:00 | Stop: 2022-02-17

## 2022-02-17 MED ADMIN — potassium chloride 10 mEq in 100 mL IVPB: 10 meq | INTRAVENOUS | @ 10:00:00 | Stop: 2022-02-17

## 2022-02-17 MED ADMIN — heparin (porcine) 5,000 unit/mL injection 5,000 Units: 5000 [IU] | SUBCUTANEOUS | @ 06:00:00

## 2022-02-17 MED ADMIN — dextrose 5 % and sodium chloride 0.45 % infusion: 100 mL/h | INTRAVENOUS | @ 22:00:00

## 2022-02-17 MED ADMIN — bupivacaine (PF) (MARCAINE) 0.15 % in sodium chloride (NS) 250 mL epidural: EPIDURAL | @ 20:00:00

## 2022-02-17 MED ADMIN — acetaminophen (OFIRMEV) 10 mg/mL injection 650 mg 65 mL: 650 mg | INTRAVENOUS | @ 09:00:00 | Stop: 2022-02-18

## 2022-02-17 MED ADMIN — naloxone 4 mg in sodium chloride 0.9 % 250 mL (0.016 mg/mL) infusion: .25 ug/kg/h | INTRAVENOUS | @ 08:00:00 | Stop: 2022-02-17

## 2022-02-17 MED ADMIN — dextrose 5 % and sodium chloride 0.45 % infusion: 100 mL/h | INTRAVENOUS | @ 12:00:00

## 2022-02-17 MED ADMIN — diphenhydrAMINE (BENADRYL) injection: 12.5 mg | INTRAVENOUS | @ 01:00:00 | Stop: 2022-02-16

## 2022-02-17 MED ADMIN — acetaminophen (OFIRMEV) 10 mg/mL injection 650 mg 65 mL: 650 mg | INTRAVENOUS | @ 16:00:00 | Stop: 2022-02-18

## 2022-02-17 MED ADMIN — heparin (porcine) 5,000 unit/mL injection 5,000 Units: 5000 [IU] | SUBCUTANEOUS | @ 15:00:00

## 2022-02-17 MED ADMIN — potassium chloride 10 mEq in 100 mL IVPB: 10 meq | INTRAVENOUS | @ 11:00:00 | Stop: 2022-02-17

## 2022-02-17 MED ADMIN — lactated Ringers infusion: 100 mL/h | INTRAVENOUS | @ 10:00:00 | Stop: 2022-02-17

## 2022-02-17 MED ADMIN — naloxone 4 mg in sodium chloride 0.9 % 250 mL (0.016 mg/mL) infusion: .25 ug/kg/h | INTRAVENOUS | @ 20:00:00 | Stop: 2022-02-18

## 2022-02-17 MED ADMIN — famotidine (PF) (PEPCID) injection 20 mg: 20 mg | INTRAVENOUS | @ 15:00:00

## 2022-02-17 NOTE — Unmapped (Signed)
Pt arrived as a lateral transfer from SICU ~0500. Neuro checks q4, neurologically intact. ST, MAP >65. Room air. NGT in place to LIS. Q6 BGM. Ileostomy. X2 abd JP drains. Foley. x1PIV, Port. Epidural in place for pain management. D5 1/2 IVF.       Problem: Latex Allergy  Goal: Absence of Allergy Symptoms  Outcome: Ongoing - Unchanged     Problem: Self-Care Deficit  Goal: Improved Ability to Complete Activities of Daily Living  Outcome: Ongoing - Unchanged     Problem: Fall Injury Risk  Goal: Absence of Fall and Fall-Related Injury  Outcome: Ongoing - Unchanged  Intervention: Promote Injury-Free Environment  Recent Flowsheet Documentation  Taken 02/17/2022 0500 by Pearla Dubonnet, RN  Safety Interventions:   aspiration precautions   bed alarm   fall reduction program maintained   low bed     Problem: Wound  Goal: Optimal Pain Control and Function  Intervention: Prevent or Manage Pain  Recent Flowsheet Documentation  Taken 02/17/2022 0500 by Pearla Dubonnet, RN  Sleep/Rest Enhancement: awakenings minimized

## 2022-02-17 NOTE — Unmapped (Signed)
Contacted by SICU bed commander for uncontrolled pruritus not responsive to one dose of IV benadryl 12.5 mg. Naloxone infusion started at 25 mcg/kg/hr.

## 2022-02-17 NOTE — Unmapped (Signed)
Patient started on Narcan infusion for itching. Epidural in place, site intact. Patient repositioning self with assistance at times. Report given to NSICU RN and patient transferred. Patient updated on plan of care.   Problem: Latex Allergy  Goal: Absence of Allergy Symptoms  Outcome: Progressing     Problem: Wound  Goal: Optimal Coping  Outcome: Progressing  Goal: Optimal Functional Ability  Outcome: Progressing  Intervention: Optimize Functional Ability  Recent Flowsheet Documentation  Taken 02/16/2022 2000 by Filomena Jungling, RN  Activity Management: bedrest  Goal: Absence of Infection Signs and Symptoms  Outcome: Progressing  Goal: Improved Oral Intake  Outcome: Progressing  Goal: Optimal Pain Control and Function  Outcome: Progressing  Goal: Skin Health and Integrity  Outcome: Progressing  Intervention: Optimize Skin Protection  Recent Flowsheet Documentation  Taken 02/16/2022 2000 by Filomena Jungling, RN  Activity Management: bedrest  Pressure Reduction Techniques:   heels elevated off bed   frequent weight shift encouraged   weight shift assistance provided  Head of Bed (HOB) Positioning: HOB at 20-30 degrees  Pressure Reduction Devices:   foam padding utilized   positioning supports utilized   pressure-redistributing mattress utilized  Skin Protection:   adhesive use limited   incontinence pads utilized   silicone foam dressing in place   skin-to-device areas padded   tubing/devices free from skin contact  Goal: Optimal Wound Healing  Outcome: Progressing     Problem: Adult Inpatient Plan of Care  Goal: Plan of Care Review  Outcome: Progressing  Goal: Patient-Specific Goal (Individualized)  Outcome: Progressing  Goal: Absence of Hospital-Acquired Illness or Injury  Outcome: Progressing  Intervention: Identify and Manage Fall Risk  Recent Flowsheet Documentation  Taken 02/16/2022 2000 by Filomena Jungling, RN  Safety Interventions:   aspiration precautions   bed alarm   fall reduction program maintained   low bed   lighting adjusted for tasks/safety   room near unit station   nonskid shoes/slippers when out of bed  Intervention: Prevent Skin Injury  Recent Flowsheet Documentation  Taken 02/17/2022 0400 by Filomena Jungling, RN  Positioning for Skin: (Self Turn) Other (Comment)  Taken 02/17/2022 0200 by Filomena Jungling, RN  Positioning for Skin: (Self Turn) Other (Comment)  Taken 02/17/2022 0000 by Filomena Jungling, RN  Positioning for Skin: (Self Turn) Other (Comment)  Taken 02/16/2022 2200 by Filomena Jungling, RN  Positioning for Skin: (Self Turn) Other (Comment)  Taken 02/16/2022 2000 by Filomena Jungling, RN  Positioning for Skin: (Self Turn) Other (Comment)  Device Skin Pressure Protection:   absorbent pad utilized/changed   pressure points protected   skin-to-device areas padded  Skin Protection:   adhesive use limited   incontinence pads utilized   silicone foam dressing in place   skin-to-device areas padded   tubing/devices free from skin contact  Intervention: Prevent and Manage VTE (Venous Thromboembolism) Risk  Recent Flowsheet Documentation  Taken 02/17/2022 0400 by Filomena Jungling, RN  Anti-Embolism Device Type: SCD, Knee  Anti-Embolism Intervention: On  Anti-Embolism Device Location: BLE  Taken 02/17/2022 0200 by Filomena Jungling, RN  Anti-Embolism Device Type: SCD, Knee  Anti-Embolism Intervention: On  Anti-Embolism Device Location: BLE  Taken 02/17/2022 0000 by Filomena Jungling, RN  Anti-Embolism Device Type: SCD, Knee  Anti-Embolism Intervention: On  Anti-Embolism Device Location: BLE  Taken 02/16/2022 2200 by Filomena Jungling, RN  Anti-Embolism Device Type: SCD, Knee  Anti-Embolism Intervention: On  Anti-Embolism Device Location: BLE  Taken 02/16/2022 2000 by Filomena Jungling, RN  VTE Prevention/Management:   fluids promoted  intravenous hydration  Anti-Embolism Device Type: SCD, Knee  Anti-Embolism Intervention: On  Anti-Embolism Device Location: BLE  Goal: Optimal Comfort and Wellbeing  Outcome: Progressing  Goal: Readiness for Transition of Care  Outcome: Progressing  Goal: Rounds/Family Conference  Outcome: Progressing     Problem: Self-Care Deficit  Goal: Improved Ability to Complete Activities of Daily Living  Outcome: Progressing     Problem: Fall Injury Risk  Goal: Absence of Fall and Fall-Related Injury  Outcome: Progressing  Intervention: Promote Injury-Free Environment  Recent Flowsheet Documentation  Taken 02/16/2022 2000 by Filomena Jungling, RN  Safety Interventions:   aspiration precautions   bed alarm   fall reduction program maintained   low bed   lighting adjusted for tasks/safety   room near unit station   nonskid shoes/slippers when out of bed     Problem: Skin Injury Risk Increased  Goal: Skin Health and Integrity  Outcome: Progressing  Intervention: Optimize Skin Protection  Recent Flowsheet Documentation  Taken 02/16/2022 2000 by Filomena Jungling, RN  Activity Management: bedrest  Pressure Reduction Techniques:   heels elevated off bed   frequent weight shift encouraged   weight shift assistance provided  Head of Bed (HOB) Positioning: HOB at 20-30 degrees  Pressure Reduction Devices:   foam padding utilized   positioning supports utilized   pressure-redistributing mattress utilized  Skin Protection:   adhesive use limited   incontinence pads utilized   silicone foam dressing in place   skin-to-device areas padded   tubing/devices free from skin contact

## 2022-02-17 NOTE — Unmapped (Signed)
Surgery Progress Note    Hospital Day: 3    Assessment:     Debra Ponce is a 52 y.o. 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 She underwent an exploratory laparotomy, low anterior resection with diverting loop ileostomy, TAH and BSO, splenectomy, cholecystectomy, and appendectomy and received IP cisplatin intraoperatively.    Interval Events:     NAE. UOP remains adequate with stable tachycardia in the 100s. Itching overnight improved with naloxone gtt. Still no gas or bowel function from the ileostomy.     Plan:   Postoperative pain management:  - Epidural in place, APS is managing  - Naloxone gtt for itching.     CV: Blood pressure improved with reduction in epidural rate and resuscitation.     Pulm:   - Post-operative O2 requirement: Room air. Encouraging IS and ambulation today.     Fluids: LR @ 100  Electrolytes: No clinically significant electrolyte abnormalities  Nutrition: NPO    GI: S/p cholecystectomy, LAR with DLI  - WOCN consulted for new ileostomy, appreciate teaching and recommendations  - Continue NPO with NGT to LIWS. Will trend output given propensity for ileus after HIPEC.  - Scopolamine for nausea    Endo:   - D10 PRN for hypoglycemia    Renal/GU:   - Will remove foley today and TOV.   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     ID: No acute concerns    Ppx: SQH 5000q8 while epidural in place    Dispo  - Current level of care: Floor  - Patient will need final ostomy teaching and supplies prior to discharge   - Unlikely to discharge with drains in place - will measure drain amylase on POD3 and POD5    Objective:      Vital Signs:  BP 118/68  - Pulse 104  - Temp 36.9 ??C (98.4 ??F) (Oral)  - Resp 18  - Ht 149.9 cm (4' 11)  - Wt 56 kg (123 lb 7.3 oz)  - LMP 11/14/2020 (Approximate)  - SpO2 94%  - BMI 24.94 kg/m??     Input/Output:  I/O         01/02 0701  01/03 0700 01/03 0701  01/04 0700 01/04 0701  01/05 0700    I.V. (mL/kg) 4536.6 2548.8 (45.5)     Blood 750      NG/GT  100     IV Piggyback 2609.3 1500     Total Intake 7896 4148.8     Urine (mL/kg/hr) 3390 2850 (2.1)     Emesis/NG output 80 150     Drains 445 602     Stool 0 0     Blood 1000      Total Output(mL/kg) 4915 3602 (64.3)     Net +2981 +546.8            Stool Occurrence 0 x 0 x             Physical Exam:    General: Cooperative, no  distress, well appearing  HEENT: Normocephalic, atraumatic  Pulmonary: Normal work of breathing, equal bilateral chest rise  Cardiovascular: Regular tachycardia  Abdomen: Soft, appropriately tender, mildly distended. Incisions c/d/i with dermabond in place. Drains with s/s output. Ileostomy is pink, bowel sweat in the bag.  Extremities: Minimal peripheral edema    Labs:    Lab Results   Component Value Date    WBC 53.1 (HH) 02/17/2022    HGB 7.1 (L) 02/17/2022    HCT 21.3 (L) 02/17/2022    PLT 379 02/17/2022       Lab Results   Component Value Date    NA 144 02/17/2022    K 3.7 02/17/2022    CL 113 (H) 02/17/2022    CO2 24.0 02/17/2022    BUN 6 (L) 02/17/2022    CREATININE 0.63 02/17/2022    CALCIUM 8.4 (L) 02/17/2022    MG 2.1 02/17/2022    PHOS 3.0 02/17/2022       Microbiology Results (last day)       Procedure Component Value Date/Time Date/Time    Amylase, body fluid [[id number]] Collected: 02/16/22 0350    Lab Status: Final result Specimen: Fluid, Peritoneal Updated: 02/16/22 0451     Amylase, Fluid 214 U/L      Comment: Peritional fluid amylase >3-5X serum amylase suggests pancreatic ascities.        Amylase, Fluid Type Fluid, Peritoneal    Narrative:      The reference range for this body fluid has not been established. The test result must be integrated into the clinical context for interpretation.  This test was developed and its performance characteristics determined by the [facility] of the [employer] [employer]. This test has not been cleared or approved by the FDA. The laboratory is regulated under CAP and CLIA as qualified to perform high-complexity testing. This test is to be used for clinical purposes and should not be regarded as investigational or for research.            Imaging:   XR Abdomen 1 View    Result Date: 02/16/2022  EXAM: XR ABDOMEN 1 VIEW DATE: 02/16/2022 6:01 AM ACCESSION: [id number] UN DICTATED: 02/16/2022 8:48 AM INTERPRETATION LOCATION: MAIN CAMPUS     CLINICAL INDICATION: 52 years old Female with NGT (CATHETER VASCULAR FIT & ADJ)      COMPARISON: Abdominal/pelvis CT 02/09/2022.     TECHNIQUE: Supine views of the abdomen were obtained.      FINDINGS: Esophagogastric tube with tip and sideport projecting over the stomach. Surgical drains projecting over the right upper quadrant and pelvis. Thoracic epidural catheter overlying the midline chest. Nonobstructive bowel gas pattern. No overt free air, although limited evaluation on this non-upright examination. Blunted left costophrenic angle.         Esophagogastric tube with tip and sideport projecting over the stomach.

## 2022-02-17 NOTE — Unmapped (Signed)
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 She underwent an exploratory laparotomy, low anterior resection with diverting loop ileostomy, TAH and BSO, splenectomy, cholecystectomy, and appendectomy and received cisplatin intraoperatively.     Pt seen in neuro ICU.  Pt has NG tube in place and says she can try some teaching at this time.  Pt wearing a 57mm 1 piece appliance in place; pt states it was placed by nursing earlier today or last night.  Her appliance is intact without leaking.  Initiated ileostomy teaching session, and we got about 1/2 way through when her family arrived, and pt requested to end session and come back tomorrow.  Educated as below with apparent understanding but that was not validated by return education by patient.      Ileostomy Teaching:  Educated patient on the following subjects:  -      Anatomical changes from surgery  -      Assessing ostomy and expected changes over 6-8 weeks  -      Skin care  -      Emptying appliance  -      Risks of dehydration; s/sx dehydration; proper response to dehydration        Stoma Type:  -  Ileostomy Stoma Location:  - RLQ (Right Lower Quadrant)     Stoma Characteristics:  - Round  - Budded Stoma Mucosal Condition and Color:  - Moist  - Edematous  - Red     Mucocutaneous Junction:  - Not able to assess at this time, pouch not removed    Rod/Stents:  - No     Output:  - Debra Ponce  - Liquid  - Stool     Peristomal Skin Condition:   - Not able to assess at this time, pouch not removed    Abdominal Contours:  - Soft    Pouching System:  - 2 Piece  - Flat  - CTF (Cut to fit) Anticipated Wear Time of Pouching System:  - To be determined     Teaching Limitations/Considerations:   - Anxiety  - Drowsy    Teaching/Instructions:  - Discussed agreed upon goals that the patient/family member would be able to empty and change ostomy pouch, have the instructions needed to order home supplies and know how to trouble shoot pouch leakage and common peristomal skin problems.    Ostomy Home Starter Kit - Verbal Consent Obtained:  - Not asked    Recommendations/Plan:   - Patient will need more ostomy teaching prior to discharge, WOC nurse will continue to follow.  - WOC nurse will follow up in 1 days for pouch evaluation.  - Unit to order discharge ostomy supplies.  - If pouch leaks, Change with bedside supplies.  - Pending discharge ostomy supply list.    Ostomy Discharge Goals:  - Patient/family member able to empty pouch independently.  - Patient/family member able to change pouch independently.  - Patient/family member able to identify ostomy supplies to take home at discharge and knowledge of supply order process.     Recommended Consults:   - Not Applicable Plan of Care Discussed  With:  - Patient  - Family  - RN Debra Ponce     Ostomy Supplies:   - Supplies available on unit.  - Unit to order.  - Patient specific supplies at bedside.    Ostomy Product List:  Inpatient supply list: take home quantity listed     Pouch System:  [employer] 2-Piece Soft Convex Wafer Red (mfg [id number]) - [facility] number [phone]) order 5  [employer] 2-Piece Fecal Pouch Red (mfg 18003) - [facility]son number [phone]) order 5    Accessories:   [employer] (mfg [id number])- [facility] number [phone]) order 5  [employer]enta ConvaTec Adhesive Remover Wipes (mfg [id number])- [facility]son number [phone]) order 1 box  Hollister Ostomy Belt 23-43 (mfg 7300) - [facility]son number [phone]) order 1  38M No-Sting Barrier Film- Spray (mfg 3346) - lawson number ([id number]) order 1     [facility]N Follow-up:  - Follow up on 1/5      Workup Time:  45 minutes      Assessment, Education, Supplies, Direct care, Charting      ReConsult or call for questions, new concerns or wound deterioration  Debra Ponce, [address]  [phone]

## 2022-02-17 NOTE — Unmapped (Signed)
Department of Anesthesiology  Pain Division  Acute Pain Pager [phone]        Acute Pain Service Follow-Up Note- Epidural      Assessment and Plan  Patient is a 52 y.o. s/p cytoreductive surgery and HIPEC who is now POD# 2 currently with an epidural catheter for postoperative pain.    Interval updates:   Some increased pain noted yesterday with reduction in epidural that was improved with increase in epidural rate to 5 ml/hr yesterday afternoon with improvement in BP. States she does get benefit with PCEA doses, but has required a few doses of PRN dilaudid. Some pruritus noted overnight that was improved with initiation of narcan infusion.     Catheter status/changes: Will adjust epidural: Rate increased to 7ml/hr on rounds.  Blood thinner: heparin 5000u SQ  Additional pain/sedation medications: Being managed by the Acute Pain Service.  See changes below   Current diet:   Active Orders   Diet    NPO No Exceptions; Medically necessary       Recommendations:  - Continue 0.125% bupivacaine with hydromorphone epidural at 6 ml/hr  - Continue scheduled IV tylenol  - Continue PRN IV dilaudid  - Continue narcan infusion for pruritis    We will continue to follow.  Contact the Acute Pain Service first at [phone] with questions or concerns 24 hours a day.  If unable to reach, please contact on call Anesthesiology resident at [phone].     If applicable, naloxone available for sedation or respiratory depression.  If applicable, nalbuphine available for refractory pruritis.  Call APS for additional concerns.  No anesthesia/epidural need for foley catheter.  Contact APS before starting any anticoagulant other than [place] heparin.    CPT  daily management of epidural 21308   Diagnosis: abdominal pain      History  Patient is a 52 y.o. s/p cytoreductive surgery and HIPEC on 02/15/22 by Dr. Pricilla Holm and Vanessa Ralphs.    Daily Hospital Course  There were Acute Events Overnight regarding pruritus.   The patient is obtaining adequate pain relief on current medication regimen and feels that their pain is Controlled    Epidural Catheter: Currently running  Level: T8/9  LOR:   4.5 cm  Catheter at skin:  9.5 cm  Catheter Start Date: 02/15/22  Postop/Catheter Day: 2    Analgesia Evaluation:  Pain at rest: 0/10  Pain with activity: 4/10    Infusion: 0.125% bupivacaine with 50mcg/ml hydromorphone  Continuous Infusion Rate: 13ml/hour  PCEA Settings: 2ml every 30  minutes    Additional opioid and non-opioid analgesics:   IV acetaminophen 650 mg q6h  IV hydromorphone 0.25-0.5 mg PRN    Complications:  Pruritis: no  Urinary Retention: urinary catheter  Nausea: No  Vomiting: No  Sedation: No  Respiratory Depression: No    Physical Exam  Temp:  [36.9 ??C (98.4 ??F)] 36.9 ??C (98.4 ??F)  Core Temp:  [36.7 ??C (98.1 ??F)-37.6 ??C (99.7 ??F)] 37.2 ??C (99 ??F)  Heart Rate:  [94-125] 104  SpO2 Pulse:  [94-125] 104  Resp:  [15-27] 18  BP: (97-128)/(43-76) 118/68  MAP (mmHg):  [60-91] 83  A BP-1: (104-115)/(43-51) 114/47  MAP:  [64 mmHg-72 mmHg] 69 mmHg  SpO2:  [93 %-100 %] 94 %  BMI (Calculated):  [24.92] 24.92  GEN: Patient sitting up in bed, in no apparent distress.  NG is present.  CHEST: Unlabored breathing, supplemental oxygen is not present.  Chest tube is not present.  SKIN: epidural catheter  dressing clean, dry, intact, no erythema at insertion site, no fluctuance or tenderness noted  GENITOURINARY: foley catheter is present  NEUROLOGIC: patient moves all extremities, 5/5 strength in lower extremities, alert and oriented to person, place and time; other deficits noted: None    Lab Results   Component Value Date    WBC 53.1 (HH) 02/17/2022    RBC 2.60 (L) 02/17/2022    HGB 7.1 (L) 02/17/2022    HCT 21.3 (L) 02/17/2022    MCV 82.1 02/17/2022    MCH 27.3 02/17/2022    MCHC 33.3 02/17/2022    RDW 17.7 (H) 02/17/2022    PLT 379 02/17/2022    MPV 6.8 02/17/2022     Lab Results   Component Value Date    PT 15.7 (H) 01/09/2022    INR 1.42 01/09/2022     Lab Results   Component Value Date    CREATININE 0.63 02/17/2022

## 2022-02-17 NOTE — Unmapped (Signed)
Pt A&Ox4.  Complaints of pain alleviated by epidural and PRN dilaudid.  MAE and follows commands.  Pupils intact.  ST on monitor.  BP lower this AM with MAPs in 50s-low 60s.  LR bolus ongoing during shift change.  Decrease in epidural from 56mL/hr to 90mL/hr with good affect on BP.  Afebrile.  RA.  NPO, ostomy in place with a small amount of output.  Ostomy leaked today so no measured output.  BG WNL, no insulin needed.  Foley in place draining clear yellow urine.  Good UOP this shift.  JP drain intact and draining serosanguinous fluid.  Abdominal incision C/D/I.   Pt's husband and parents visited and updated on POC.    Problem: Wound  Goal: Optimal Coping  Outcome: Ongoing - Unchanged  Goal: Absence of Infection Signs and Symptoms  Outcome: Ongoing - Unchanged  Intervention: Prevent or Manage Infection  Recent Flowsheet Documentation  Taken 02/16/2022 0800 by Odis Hollingshead, RN  Infection Management: aseptic technique maintained  Goal: Improved Oral Intake  Outcome: Ongoing - Unchanged  Goal: Optimal Pain Control and Function  Outcome: Ongoing - Unchanged  Goal: Optimal Wound Healing  Outcome: Ongoing - Unchanged     Problem: Adult Inpatient Plan of Care  Goal: Plan of Care Review  Outcome: Ongoing - Unchanged  Goal: Absence of Hospital-Acquired Illness or Injury  Outcome: Ongoing - Unchanged  Intervention: Identify and Manage Fall Risk  Recent Flowsheet Documentation  Taken 02/16/2022 0800 by Odis Hollingshead, RN  Safety Interventions:   aspiration precautions   bleeding precautions   environmental modification   fall reduction program maintained   infection management   lighting adjusted for tasks/safety   low bed  Intervention: Prevent Skin Injury  Recent Flowsheet Documentation  Taken 02/16/2022 1800 by Odis Hollingshead, RN  Positioning for Skin: (repositions self) Other (Comment)  Taken 02/16/2022 1600 by Odis Hollingshead, RN  Positioning for Skin: (repositions self) Other (Comment)  Taken 02/16/2022 1400 by Odis Hollingshead, RN  Positioning for Skin: (repositions self) Other (Comment)  Taken 02/16/2022 1200 by Odis Hollingshead, RN  Positioning for Skin: (repositions self) Other (Comment)  Taken 02/16/2022 1000 by Odis Hollingshead, RN  Positioning for Skin: (repositions self) Other (Comment)  Taken 02/16/2022 0800 by Odis Hollingshead, RN  Positioning for Skin: (repositions self) Other (Comment)  Device Skin Pressure Protection: absorbent pad utilized/changed  Skin Protection:   adhesive use limited   cleansing with dimethicone incontinence wipes   incontinence pads utilized  Intervention: Prevent and Manage VTE (Venous Thromboembolism) Risk  Recent Flowsheet Documentation  Taken 02/16/2022 1800 by Odis Hollingshead, RN  Anti-Embolism Device Type: SCD, Knee  Anti-Embolism Intervention: On  Anti-Embolism Device Location: BLE  Taken 02/16/2022 1600 by Odis Hollingshead, RN  Anti-Embolism Device Type: SCD, Knee  Anti-Embolism Intervention: On  Anti-Embolism Device Location: BLE  Taken 02/16/2022 1400 by Odis Hollingshead, RN  Anti-Embolism Device Type: SCD, Knee  Anti-Embolism Intervention: On  Anti-Embolism Device Location: BLE  Taken 02/16/2022 1200 by Odis Hollingshead, RN  Anti-Embolism Device Type: SCD, Knee  Anti-Embolism Intervention: On  Anti-Embolism Device Location: BLE  Taken 02/16/2022 1000 by Odis Hollingshead, RN  Anti-Embolism Device Type: SCD, Knee  Anti-Embolism Intervention: On  Anti-Embolism Device Location: BLE  Taken 02/16/2022 0800 by Odis Hollingshead, RN  Anti-Embolism Device Type: SCD, Knee  Anti-Embolism Intervention: On  Anti-Embolism Device Location: BLE  Intervention: Prevent Infection  Recent Flowsheet Documentation  Taken 02/16/2022 0800  by Odis Hollingshead, RN  Infection Prevention:   equipment surfaces disinfected   hand hygiene promoted   personal protective equipment utilized   rest/sleep promoted   single patient room provided  Goal: Rounds/Family Conference  Outcome: Ongoing - Unchanged     Problem: Self-Care Deficit  Goal: Improved Ability to Complete Activities of Daily Living  Outcome: Ongoing - Unchanged     Problem: Fall Injury Risk  Goal: Absence of Fall and Fall-Related Injury  Outcome: Ongoing - Unchanged  Intervention: Promote Injury-Free Environment  Recent Flowsheet Documentation  Taken 02/16/2022 0800 by Odis Hollingshead, RN  Safety Interventions:   aspiration precautions   bleeding precautions   environmental modification   fall reduction program maintained   infection management   lighting adjusted for tasks/safety   low bed     Problem: Skin Injury Risk Increased  Goal: Skin Health and Integrity  Outcome: Ongoing - Unchanged  Intervention: Optimize Skin Protection  Recent Flowsheet Documentation  Taken 02/16/2022 1200 by Odis Hollingshead, RN  Activity Management: bedrest  Head of Bed [facility]) Positioning: HOB at 20-30 degrees  Taken 02/16/2022 0800 by Odis Hollingshead, RN  Activity Management: bedrest  Pressure Reduction Techniques:   frequent weight shift encouraged   weight shift assistance provided  Head of Bed (HOB) Positioning: HOB at 20-30 degrees  Pressure Reduction Devices:   positioning supports utilized   specialty bed utilized  Skin Protection:   adhesive use limited   cleansing with dimethicone incontinence wipes   incontinence pads utilized

## 2022-02-17 NOTE — Unmapped (Signed)
GYN ONC PROGRESS NOTE    ASSESSMENT AND PLAN     Hospital Day: 3    Debra Ponce is a 52 y.o. 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     ONC: Stage IIIC mixed germ cell tumor  - Primary Oncologist: Dr. Pricilla Holm  - Last treatment: Bleomycin, Etoposide, Cisplatin on 11/28 (s/p 2C)  - Last CA-125: 469  - S/p above procedure: final path pending    Postoperative Care:  - H/H: 8.1 > EBL 1000 mL > 3u pRBCs intra-op >> 7.1, recommend transfusing 1u pRBC for goal of hgb > 8 given anastomoses and future treatment   - ERAS protocol for pain: sch tylenol, PRN Dilaudid; epidural   - On RA  - mIVF at 185mL/hr   - NPO, NGT in place; management per primary team  - Foley in place, management per primary team   - S/p splenectomy; POD 3, 5 drain amylase; will need post splenectomy vaccines prior to discharge     Chronic Medical Conditions  - Anemia: Hgb trended as above  - GAD: Ativan PRN    PPX: SCDs, heparin, IS, ambulation    Dispo: ICU    Plan discussed with Dr. Hubert Azure and Attending Dr. Ginnie Smart.    SUBJECTIVE     No acute events overnight. Pt reports feeling more sore today. She has used her PRN dilaudid a few times with relief. She is very thirsty. Her throat is bothering her. She denies any nausea or vomiting. She has a Foley in place.    OBJECTIVE     Temp:  [36.9 ??C (98.4 ??F)] 36.9 ??C (98.4 ??F)  Heart Rate:  [94-125] 104  SpO2 Pulse:  [94-125] 104  Resp:  [15-27] 18  BP: (91-128)/(36-76) 118/68  A BP-1: (103-115)/(37-51) 114/47  SpO2:  [93 %-100 %] 94 %    Gen: NAD, awake, alert, oriented  CV: regular rate and rhythm Pulm: clear to auscultation bilaterally, normal work of breathing  Abd: Soft, appropriately tender to palpation, non-distended, bowel sounds present; Ostomy with healthy appearing bowel, no output in bag. 2 JP drains with serosanguinous fluid.   Incision: midline vertical incision clean, dry, intact closed with suture, with no surrounding erythema or induration  GU: Foley in place with clear yellow urine in bag      Intake/Output Summary (Last 24 hours) at 02/17/2022 0648  Last data filed at 02/17/2022 0600  Gross per 24 hour   Intake 4152.78 ml   Output 3677 ml   Net 475.78 ml       Medications (scheduled)    acetaminophen  650 mg Intravenous Q6H    famotidine (PEPCID) IV  20 mg Intravenous BID    heparin (porcine) for subcutaneous use  5,000 Units Subcutaneous Q8H SCH    insulin regular  0-20 Units Subcutaneous Q6H SCH    scopolamine  1 patch Topical Q72H       Medications (prn)  calcium gluconate, dextrose in water, glucagon, HYDROmorphone **OR** HYDROmorphone, magnesium sulfate, naloxone, ondansetron, phenol, potassium chloride in water, prochlorperazine (COMPAZINE) 5 mg in sodium chloride (NS) 0.9 % 25 mL IVPB    Labs:  Lab Results   Component Value Date    WBC  53.1 (HH) 02/17/2022    HGB 7.1 (L) 02/17/2022    HCT 21.3 (L) 02/17/2022    PLT 379 02/17/2022       Lab Results   Component Value Date    NA 144 02/17/2022    K 3.7 02/17/2022    CL 113 (H) 02/17/2022    CO2 24.0 02/17/2022    BUN 6 (L) 02/17/2022    CREATININE 0.63 02/17/2022    GLU 79 02/17/2022    CALCIUM 8.4 (L) 02/17/2022    MG 2.1 02/17/2022    PHOS 3.0 02/17/2022       Lab Results   Component Value Date    BILITOT 0.4 02/17/2022    BILIDIR 0.30 01/10/2022    PROT 4.4 (L) 02/17/2022    ALBUMIN 2.0 (L) 02/17/2022    ALT 18 02/17/2022    AST 22 02/17/2022    ALKPHOS 78 02/17/2022       Lab Results   Component Value Date    INR 1.42 01/09/2022    APTT 33.4 01/09/2022

## 2022-02-18 LAB — AMYLASE: AMYLASE: 35 U/L (ref 30–118)

## 2022-02-18 LAB — BASIC METABOLIC PANEL
ANION GAP: 5 mmol/L (ref 5–14)
BLOOD UREA NITROGEN: <5 mg/dL — ABNORMAL LOW (ref 9–23)
CALCIUM: 8 mg/dL — ABNORMAL LOW (ref 8.7–10.4)
CHLORIDE: 107 mmol/L (ref 98–107)
CO2: 29 mmol/L (ref 20.0–31.0)
CREATININE: 0.46 mg/dL — ABNORMAL LOW
EGFR CKD-EPI (2021) FEMALE: >90 mL/min/{1.73_m2} (ref >=60)
GLUCOSE RANDOM: 100 mg/dL — ABNORMAL HIGH (ref 70–99)
POTASSIUM: 3.1 mmol/L — ABNORMAL LOW (ref 3.4–4.8)
SODIUM: 141 mmol/L (ref 135–145)

## 2022-02-18 LAB — COMPREHENSIVE METABOLIC PANEL
ALBUMIN: 1.9 g/dL — ABNORMAL LOW (ref 3.4–5.0)
ALKALINE PHOSPHATASE: 106 U/L (ref 46–116)
ALT (SGPT): 15 U/L (ref 10–49)
ANION GAP: 7 mmol/L (ref 5–14)
AST (SGOT): 15 U/L (ref <=34)
BILIRUBIN TOTAL: 0.4 mg/dL (ref 0.3–1.2)
BLOOD UREA NITROGEN: <5 mg/dL — ABNORMAL LOW (ref 9–23)
CALCIUM: 7.6 mg/dL — ABNORMAL LOW (ref 8.7–10.4)
CHLORIDE: 105 mmol/L (ref 98–107)
CO2: 26 mmol/L (ref 20.0–31.0)
CREATININE: 0.46 mg/dL — ABNORMAL LOW
EGFR CKD-EPI (2021) FEMALE: >90 mL/min/{1.73_m2} (ref >=60)
GLUCOSE RANDOM: 216 mg/dL — ABNORMAL HIGH (ref 70–99)
POTASSIUM: 2.6 mmol/L — CL (ref 3.4–4.8)
PROTEIN TOTAL: 4.3 g/dL — ABNORMAL LOW (ref 5.7–8.2)
SODIUM: 138 mmol/L (ref 135–145)

## 2022-02-18 LAB — MAGNESIUM: MAGNESIUM: 1.5 mg/dL — ABNORMAL LOW (ref 1.6–2.6)

## 2022-02-18 LAB — LIPASE: LIPASE: 22 U/L (ref 12–53)

## 2022-02-18 LAB — PHOSPHORUS: PHOSPHORUS: 2.8 mg/dL (ref 2.4–5.1)

## 2022-02-18 MED ADMIN — scopolamine (TRANSDERM-SCOP) 1 mg over 3 days topical patch 1 mg: 1 | TOPICAL | @ 22:00:00

## 2022-02-18 MED ADMIN — potassium chloride 20 mEq in 100 mL IVPB Premix: 20 meq | INTRAVENOUS | @ 19:00:00 | Stop: 2022-02-18

## 2022-02-18 MED ADMIN — potassium chloride 20 mEq in 100 mL IVPB Premix: 20 meq | INTRAVENOUS | @ 20:00:00 | Stop: 2022-02-18

## 2022-02-18 MED ADMIN — heparin (porcine) 5,000 unit/mL injection 5,000 Units: 5000 [IU] | SUBCUTANEOUS | @ 17:00:00

## 2022-02-18 MED ADMIN — HYDROmorphone (PF) injection Syrg 0.25 mg: .25 mg | INTRAVENOUS | @ 19:00:00 | Stop: 2022-03-01

## 2022-02-18 MED ADMIN — magnesium sulfate 2gm/50mL IVPB: 2 g | INTRAVENOUS | @ 19:00:00 | Stop: 2022-02-18

## 2022-02-18 MED ADMIN — famotidine (PF) (PEPCID) injection 20 mg: 20 mg | INTRAVENOUS | @ 14:00:00

## 2022-02-18 MED ADMIN — dextrose 5 % and sodium chloride 0.45 % infusion: 75 mL/h | INTRAVENOUS

## 2022-02-18 MED ADMIN — HYDROmorphone (PF) injection Syrg 0.25 mg: .25 mg | INTRAVENOUS | @ 23:00:00 | Stop: 2022-03-01

## 2022-02-18 MED ADMIN — heparin (porcine) 5,000 unit/mL injection 5,000 Units: 5000 [IU] | SUBCUTANEOUS | @ 06:00:00

## 2022-02-18 MED ADMIN — potassium chloride 20 mEq in 100 mL IVPB Premix: 20 meq | INTRAVENOUS | @ 22:00:00 | Stop: 2022-02-18

## 2022-02-18 MED ADMIN — magnesium sulfate 2gm/50mL IVPB: 2 g | INTRAVENOUS | @ 17:00:00 | Stop: 2022-02-18

## 2022-02-18 MED ADMIN — famotidine (PF) (PEPCID) injection 20 mg: 20 mg | INTRAVENOUS | @ 01:00:00

## 2022-02-18 MED ADMIN — acetaminophen (OFIRMEV) 10 mg/mL injection 650 mg 65 mL: 650 mg | INTRAVENOUS | @ 06:00:00 | Stop: 2022-02-18

## 2022-02-18 MED ADMIN — heparin (porcine) 5,000 unit/mL injection 5,000 Units: 5000 [IU] | SUBCUTANEOUS | @ 23:00:00

## 2022-02-18 MED ADMIN — acetaminophen (OFIRMEV) 10 mg/mL injection 1,000 mg: 1000 mg | INTRAVENOUS | @ 23:00:00 | Stop: 2022-02-19

## 2022-02-18 MED ADMIN — HYDROmorphone (PF) injection Syrg 0.25 mg: .25 mg | INTRAVENOUS | @ 04:00:00 | Stop: 2022-03-01

## 2022-02-18 MED ADMIN — acetaminophen (OFIRMEV) 10 mg/mL injection 650 mg 65 mL: 650 mg | INTRAVENOUS | @ 11:00:00 | Stop: 2022-02-18

## 2022-02-18 NOTE — Unmapped (Signed)
Department of Anesthesiology  Pain Division  Acute Pain Pager [phone]        Acute Pain Service Follow-Up Note- Epidural      Assessment and Plan  Patient is a 52 y.o. s/p cytoreductive surgery and HIPEC who is now POD# 3 currently with an epidural catheter for postoperative pain.    Interval updates:   Improved pain control yesterday with increase to 6 ml/hr on rounds; however, also experienced increased pruritus not relieved with narcan gtt on afternoon rounds. Swapped to plain bupivacaine epidural infusion last night with resolution of pruritus and still having significant pain relief in abdomen. 1 dose of prn dilaudid overnight taken for NG tube discomfort. Has also tried chloraseptic spray without much relief. Reports some scant ostomy output.     Catheter status/changes: Epidural working well, will continue epidural without changes.  Blood thinner: heparin 5000u SQ  Additional pain/sedation medications: Being managed by the Acute Pain Service.  See changes below   Current diet:   Active Orders   Diet    NPO No Exceptions; Medically necessary       Recommendations:  - Continue 0.15% bupivacaine plain epidural at 6 ml/hr  - Continue scheduled IV tylenol  - Continue PRN IV dilaudid    We will continue to follow.  Contact the Acute Pain Service first at [phone] with questions or concerns 24 hours a day.  If unable to reach, please contact on call Anesthesiology resident at [phone].     If applicable, naloxone available for sedation or respiratory depression.  If applicable, nalbuphine available for refractory pruritis.  Call APS for additional concerns.  No anesthesia/epidural need for foley catheter.  Contact APS before starting any anticoagulant other than [place] heparin.    CPT  daily management of epidural 21308   Diagnosis: abdominal pain      History  Patient is a 52 y.o. s/p cytoreductive surgery and HIPEC on 02/15/22 by Dr. Pricilla Holm and Vanessa Ralphs.    Daily Hospital Course  There were no Acute Events Overnight.   The patient is obtaining adequate pain relief on current medication regimen and feels that their pain is Well controlled    Epidural Catheter: Currently running  Level: T8/9  LOR:   4.5 cm  Catheter at skin:  9.5 cm  Catheter Start Date: 02/15/22  Postop/Catheter Day: 3    Analgesia Evaluation:  Pain at rest: 0/10  Pain with activity: 4/10    Infusion: 0.15% bupivacaine  Continuous Infusion Rate: 71ml/hour  PCEA Settings: 2ml every 30  minutes    Additional opioid and non-opioid analgesics:   IV acetaminophen 650 mg q6h  IV hydromorphone 0.25-0.5 mg PRN    Complications:  Pruritis: no  Urinary Retention: urinary catheter  Nausea: No  Vomiting: No  Sedation: No  Respiratory Depression: No    Physical Exam  Temp:  [36.2 ??C (97.2 ??F)-37.3 ??C (99.1 ??F)] 36.2 ??C (97.2 ??F)  Heart Rate:  [89-114] 89  SpO2 Pulse:  [114] 114  Resp:  [18-25] 18  BP: (122-138)/(58-84) 133/81  MAP (mmHg):  [76-99] 95  SpO2:  [95 %-97 %] 97 %  GEN: Patient sitting up in bed, in no apparent distress.  NG is present.  CHEST: Unlabored breathing, supplemental oxygen is not present.  Chest tube is not present.  SKIN: epidural catheter dressing clean, dry, intact, no erythema at insertion site, no fluctuance or tenderness noted  GENITOURINARY: foley catheter is present  NEUROLOGIC: patient moves all extremities, 5/5 strength in lower  extremities, alert and oriented to person, place and time; other deficits noted: None    Lab Results   Component Value Date    WBC 53.1 (HH) 02/17/2022    RBC 2.60 (L) 02/17/2022    HGB 7.1 (L) 02/17/2022    HCT 21.3 (L) 02/17/2022    MCV 82.1 02/17/2022    MCH 27.3 02/17/2022    MCHC 33.3 02/17/2022    RDW 17.7 (H) 02/17/2022    PLT 379 02/17/2022    MPV 6.8 02/17/2022     Lab Results   Component Value Date    PT 15.7 (H) 01/09/2022    INR 1.42 01/09/2022     Lab Results   Component Value Date    CREATININE 0.46 (L) 02/18/2022

## 2022-02-18 NOTE — Unmapped (Signed)
GYN ONC PROGRESS NOTE    ASSESSMENT AND PLAN     Hospital Day: 4    Debra Ponce is a 52 y.o. POD#3 s/p EUA, exploratory laparotomy, TAH w/ radical dissection, b/l ureterolysis, BSO, posterior cul-de-sac peritonectomy, tumor debulking (Gyn Onc); diagnostic laparoscopy, LAR w primary end-to-end anastomosis, enterolysis, cholecystectomy, tumor capsule tumor debulking, splenectomy, appendectomy, omentectomy, diverting loop ileostomy formation with HIPEC (Surg Onc) on 02/15/21 for Stage IIIC mixed germ cell tumor. Patient is surgical oncology primary.     ONC: Stage IIIC mixed germ cell tumor  - Primary Oncologist: Dr. Pricilla Holm  - Last treatment: Bleomycin, Etoposide, Cisplatin on 11/28 (s/p 2C)  - Last CA-125: 469  - S/p above procedure: final path pending    Postoperative Care:  - H/H: 8.1 > EBL 1000 mL > 3u pRBCs intra-op >> 7.1, recommend transfusing 1u pRBC for goal of hgb > 8 given anastomoses and future treatment   - ERAS protocol for pain: sch tylenol, PRN Dilaudid; epidural   - On RA  - mIVF at 162mL/hr   - NPO, NGT in place; management per primary team  - Foley removed, urinating without difficulty   - S/p splenectomy; POD 3, 5 drain amylase; will need post splenectomy vaccines prior to discharge     Chronic Medical Conditions  - GAD: Home prozac 10 mg, ativan PRN held    PPX: SCDs, heparin, IS, ambulation    Dispo: ICU    Plan discussed with Dr. Hubert Azure and Attending Dr. Gloris Manchester.    SUBJECTIVE     No acute events overnight. Pt in tears today. She is very uncomfortable from the NGT. She states she does not know how much longer she can take it. Her abdominal pain is well controlled. She is taking the PRN dilaudid to help with the NGT discomfort. She did not sleep much last night. She was a little nauseated overnight with no emesis.     OBJECTIVE     Temp:  [36.4 ??C (97.5 ??F)-37.3 ??C (99.1 ??F)] 36.4 ??C (97.5 ??F)  Heart Rate:  [97-114] 97  SpO2 Pulse:  [105-114] 114  Resp:  [20-25] 20  BP: (120-138)/(58-84) 130/84  SpO2:  [95 %-97 %] 95 %    Gen: NAD, awake, alert, oriented  CV: regular rate and rhythm   Pulm: clear to auscultation bilaterally, normal work of breathing  Abd: Soft, appropriately tender to palpation, non-distended, bowel sounds present; Ostomy with healthy appearing bowel, thin brown output in bag. 2 JP drains with serosanguinous fluid.   Incision: midline vertical incision clean, dry, intact closed with suture, with no surrounding erythema or induration  GU: Foley in place with clear yellow urine in bag      Intake/Output Summary (Last 24 hours) at 02/18/2022 0624  Last data filed at 02/18/2022 0535  Gross per 24 hour   Intake 894.83 ml   Output 2695 ml   Net -1800.17 ml       Medications (scheduled)    acetaminophen  650 mg Intravenous Q6H    famotidine (PEPCID) IV  20 mg Intravenous BID    heparin (porcine) for subcutaneous use  5,000 Units Subcutaneous Q8H SCH    insulin regular  0-20 Units Subcutaneous Q6H SCH    scopolamine  1 patch Topical Q72H       Medications (prn)  dextrose in water, glucagon, HYDROmorphone **OR** HYDROmorphone, naloxone, ondansetron, phenol, prochlorperazine (COMPAZINE) 5 mg in sodium chloride (NS) 0.9 % 25 mL IVPB    Labs:  Lab  Results   Component Value Date    WBC 53.1 (HH) 02/17/2022    HGB 7.1 (L) 02/17/2022    HCT 21.3 (L) 02/17/2022    PLT 379 02/17/2022       Lab Results   Component Value Date    NA 144 02/17/2022    K 3.7 02/17/2022    CL 113 (H) 02/17/2022    CO2 24.0 02/17/2022    BUN 6 (L) 02/17/2022    CREATININE 0.63 02/17/2022    GLU 79 02/17/2022    CALCIUM 8.4 (L) 02/17/2022    MG 2.1 02/17/2022    PHOS 3.0 02/17/2022       Lab Results   Component Value Date    BILITOT 0.4 02/17/2022    BILIDIR 0.30 01/10/2022    PROT 4.4 (L) 02/17/2022    ALBUMIN 2.0 (L) 02/17/2022    ALT 18 02/17/2022    AST 22 02/17/2022    ALKPHOS 78 02/17/2022       Lab Results   Component Value Date    INR 1.42 01/09/2022    APTT 33.4 01/09/2022

## 2022-02-18 NOTE — Unmapped (Signed)
Patient alert and oriented. Pt has Epidural infusing at 44mL/hr. Narcan infusion discontinued. Dilaudid PRN given x1 for pain. Pt stated relief. Pt denies n/v. NGT intact, CDI to LIWS. Pt remains NPO. JP drains x2, CDI. Ostomy intact with output. TOV completed. Pt voiding, bladder scan with residual.  MD made aware, no new orders or further interventions. Pt able to ambulate to  bedside commode. Pt remains free of falls. Call bell within reach and bed in lowest position.     Problem: Wound  Goal: Skin Health and Integrity  Outcome: Progressing     Problem: Adult Inpatient Plan of Care  Goal: Plan of Care Review  Outcome: Progressing  Goal: Patient-Specific Goal (Individualized)  Outcome: Progressing  Goal: Absence of Hospital-Acquired Illness or Injury  Outcome: Progressing  Intervention: Prevent and Manage VTE (Venous Thromboembolism) Risk  Recent Flowsheet Documentation  Taken 02/17/2022 2000 by Jacqualin Combes, RN  Anti-Embolism Device Type: SCD, Knee  Anti-Embolism Intervention: On  Goal: Optimal Comfort and Wellbeing  Outcome: Progressing     Problem: Fall Injury Risk  Goal: Absence of Fall and Fall-Related Injury  Outcome: Progressing

## 2022-02-18 NOTE — Unmapped (Signed)
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 She underwent an exploratory laparotomy, low anterior resection with diverting loop ileostomy, TAH and BSO, splenectomy, cholecystectomy, and appendectomy and received cisplatin intraoperatively.     CWON attempted to see pt for ostomy teaching and pouch assessment.  Pt's husband met me outside the room and advised they were waiting for her NG tube to be removed.  He is asking for something to bring her down 2/2 her anxiety.  He does not want an ostomy visit at this time -- perhaps later or tomorrow.  Husband will be here tomorrow by 1:30pm.    Will defer pt to tomorrow     Stoma Type:  -  Ileostomy Stoma Location:  - RLQ (Right Lower Quadrant)     Stoma Characteristics:  - Round  - Budded Stoma Mucosal Condition and Color:  - Moist  - Edematous  - Red     Mucocutaneous Junction:  - Not able to assess at this time, pouch not removed    Rod/Stents:  - No     Output:  - Briar Sword  - Liquid  - Stool     Peristomal Skin Condition:   - Not able to assess at this time, pouch not removed    Abdominal Contours:  - Soft    Pouching System:  - 2 Piece  - Flat  - CTF (Cut to fit) Anticipated Wear Time of Pouching System:  - To be determined     Teaching Limitations/Considerations:   - Anxiety  - Drowsy    Teaching/Instructions:  - Discussed agreed upon goals that the patient/family member would be able to empty and change ostomy pouch, have the instructions needed to order home supplies and know how to trouble shoot pouch leakage and common peristomal skin problems.    Ostomy Home Starter Kit - Verbal Consent Obtained:  - Not asked    Recommendations/Plan:   - Patient will need more ostomy teaching prior to discharge, WOC nurse will continue to follow.  - WOC nurse will follow up in 1 days for pouch evaluation.  - Unit to order discharge ostomy supplies.  - If pouch leaks, Change with bedside supplies.  - Pending discharge ostomy supply list.    Ostomy Discharge Goals:  - Patient/family member able to empty pouch independently.  - Patient/family member able to change pouch independently.  - Patient/family member able to identify ostomy supplies to take home at discharge and knowledge of supply order process.     Recommended Consults:   - Not Applicable Plan of Care Discussed With:  - Patient  - Family  - RN [name]     Ostomy Supplies:   - Supplies available on unit.  - Unit to order.  - Patient specific supplies at bedside.    Ostomy Product List:  Inpatient supply list: take home quantity listed     Pouch System:  H[employer]ister 2-Piece Soft Convex Wafer Red (mfg [id number]) - lawson number [phone]) order 5  [employer]ister 2-Piece Fecal Pouch Red (mfg [id number]) - lawson number ([id number]) order 5    Accessories:   Copywriter, advertising (  mfg [id number])- lawson number [phone]) order 5  Esenta ConvaTec Adhesive Remover Wipes (mfg [id number])- lawson number [phone]) order 1 box  Hollister Ostomy Belt 23-43 (mfg 7300) - lawson number [phone]) order 1  39M No-Sting Barrier Film- Spray (mfg 3346) - lawson number ([id number]) order 1     WOCN Follow-up:  - Follow up on 1/6      Workup Time:  15 minutes      Assessment, Education, Supplies, Direct care, Charting      ReConsult or call for questions, new concerns or wound deterioration  Marzetta Merino, [address]  [phone]

## 2022-02-18 NOTE — Unmapped (Signed)
Surgery Progress Note    Hospital Day: 4    Assessment:     Debra Ponce is a 52 y.o. 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 She underwent an exploratory laparotomy, low anterior resection with diverting loop ileostomy, TAH and BSO, splenectomy, cholecystectomy, and appendectomy and received IP cisplatin intraoperatively.    Interval Events:     NAE. UOP remains adequate with stable tachycardia in the 100s. Itching overnight improved with naloxone gtt. Still no gas or bowel function from the ileostomy.     Plan:   Postoperative pain management:  - Epidural in place, APS is managing  - Naloxone gtt for itching.     CV: Blood pressure improved with reduction in epidural rate and resuscitation.     Pulm:   - Post-operative O2 requirement: Room air. Encouraging IS and ambulation today.     Fluids: 72ml/hr D5 1/2 NS  Electrolytes: No clinically significant electrolyte abnormalities  Nutrition: NPO    GI: S/p cholecystectomy, LAR with DLI  - WOCN consulted for new ileostomy, appreciate teaching and recommendations  - Continue NPO with NGT to LIWS. Will trend output given propensity for ileus after HIPEC.  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     ID: No acute concerns    Ppx: SQH 5000q8 while epidural in place    Dispo  - Current level of care: Floor  - Patient will need final ostomy teaching and supplies prior to discharge   - Unlikely to discharge with drains in place - will measure drain amylase on POD3 and POD5    Objective:      Vital Signs:  BP 127/81  - Pulse 92  - Temp 36 ??C (96.8 ??F) (Oral)  - Resp 18  - Ht 149.9 cm (4' 11)  - Wt 56 kg (123 lb 7.3 oz)  - LMP 11/14/2020 (Approximate)  - SpO2 98%  - BMI 24.94 kg/m??     Input/Output:  I/O         01/03 0701  01/04 0700 01/04 0701  01/05 0700 01/05 0701  01/06 0700    P.O.   0    I.V. (mL/kg) 2548.8 (45.5) 729.7 (13)     Blood       NG/GT 100      IV Piggyback 1500 270     Total Intake 4148.8 999.7 0    Urine (mL/kg/hr) 2850 (2.1) 2295 (1.7) 600 (1.9)    Emesis/NG output 150 250 150    Drains 602 150 130    Stool 0  25    Blood       Total Output(mL/kg) 3602 (64.3) 2695 (48.1) 905 (16.2)    Net +546.8 -1695.3 -905           Urine Occurrence  1 x     Stool Occurrence 0 x  Physical Exam:    General: Cooperative, no distress, well appearing  HEENT: Normocephalic, atraumatic  Pulmonary: Normal work of breathing, equal bilateral chest rise  Cardiovascular: Regular tachycardia  Abdomen: Soft, appropriately tender, mildly distended. Incisions c/d/i with dermabond in place. Drains with s/s output. Ileostomy is pink, bowel sweat in the bag.  Extremities: Minimal peripheral edema    Labs:    Lab Results   Component Value Date    WBC 53.1 (HH) 02/17/2022    HGB 7.1 (L) 02/17/2022    HCT 21.3 (L) 02/17/2022    PLT 379 02/17/2022       Lab Results   Component Value Date    NA 141 02/18/2022    K 3.1 (L) 02/18/2022    CL 107 02/18/2022    CO2 29.0 02/18/2022    BUN <5 (L) 02/18/2022    CREATININE 0.46 (L) 02/18/2022    CALCIUM 8.0 (L) 02/18/2022    MG 1.5 (L) 02/18/2022    PHOS 2.8 02/18/2022       Microbiology Results (last day)       Procedure Component Value Date/Time Date/Time    Amylase, body fluid [[id number]] Collected: 02/16/22 0350    Lab Status: Final result Specimen: Fluid, Peritoneal Updated: 02/16/22 0451     Amylase, Fluid 214 U/L      Comment: Peritional fluid amylase >3-5X serum amylase suggests pancreatic ascities.        Amylase, Fluid Type Fluid, Peritoneal    Narrative:      The reference range for this body fluid has not been established. The test result must be integrated into the clinical context for interpretation.  This test was developed and its performance characteristics determined by the [facility] of the [employer] [employer]. This test has not been cleared or approved by the FDA. The laboratory is regulated under CAP and CLIA as qualified to perform high-complexity testing. This test is to be used for clinical purposes and should not be regarded as investigational or for research.            Imaging:   ECG 12 Lead    Result Date: 02/18/2022  SINUS RHYTHM WITH OCCASIONAL PREMATURE VENTRICULAR BEATS NON-SPECIFIC ST/T WAVE CHANGES ABNORMAL ECG WHEN COMPARED WITH ECG OF 27-Jan-2022 12:11, PREMATURE VENTRICULAR BEATS ARE NOW PRESENT VENT. RATE HAS DECREASED BY  52 BPM     Confirmed by Christella Noa ([id number]) on 02/18/2022 11:34:17 AM

## 2022-02-18 NOTE — Unmapped (Signed)
Pt received in the unit at 1620. Pt alert and oriented*4. Pt tachycardic but below parameters. Pt verbalized pain controlled with PCA. No complained of nausea and vomiting. NGT on LIS. Maintained NPO status, IVF infusing. JP drains intact . Ostomy intact, surgical site CDI. ON TVO, voided 1 Time after coming to the unit, bladder scan more than  urine output, MD notified. Safety measures in place. Will continue to monitor.   Problem: Latex Allergy  Goal: Absence of Allergy Symptoms  Outcome: Progressing     Problem: Wound  Goal: Optimal Coping  Outcome: Progressing  Goal: Optimal Functional Ability  Outcome: Progressing  Goal: Absence of Infection Signs and Symptoms  Outcome: Progressing  Goal: Improved Oral Intake  Outcome: Progressing  Goal: Optimal Pain Control and Function  Outcome: Progressing  Goal: Skin Health and Integrity  Outcome: Progressing  Goal: Optimal Wound Healing  Outcome: Progressing     Problem: Adult Inpatient Plan of Care  Goal: Plan of Care Review  Outcome: Progressing  Goal: Patient-Specific Goal (Individualized)  Outcome: Progressing  Goal: Absence of Hospital-Acquired Illness or Injury  Outcome: Progressing  Intervention: Prevent and Manage VTE (Venous Thromboembolism) Risk  Recent Flowsheet Documentation  Taken 02/17/2022 1630 by Donzetta Starch, RN  VTE Prevention/Management:   anticoagulant therapy   ambulation promoted   bleeding precautions maintained  Anti-Embolism Device Type: SCD, Knee  Anti-Embolism Intervention: On  Anti-Embolism Device Location: BLE  Goal: Optimal Comfort and Wellbeing  Outcome: Progressing  Goal: Readiness for Transition of Care  Outcome: Progressing  Goal: Rounds/Family Conference  Outcome: Progressing     Problem: Self-Care Deficit  Goal: Improved Ability to Complete Activities of Daily Living  Outcome: Progressing     Problem: Fall Injury Risk  Goal: Absence of Fall and Fall-Related Injury  Outcome: Progressing     Problem: Skin Injury Risk Increased  Goal: Skin Health and Integrity  Outcome: Progressing

## 2022-02-18 NOTE — Unmapped (Signed)
Care Management  Initial Transition Planning Assessment              General  Care Manager assessed the patient by : Telephone conversation with family, Medical record review, Discussion with Clinical Care team  Orientation Level: Oriented X4  Functional level prior to admission: Independent  Reason for referral: Discharge Planning    Contact/Decision Maker  Extended Emergency Contact Information  Primary Emergency Contact: Casagrande,Eric  Mobile Phone: [phone]  Relation: Spouse    Type of Residence: Mailing Address:  [address] 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  Past Surgical History:   has a past surgical history that includes Tubal ligation (Bilateral); Exploratory laparotomy; Tonsillectomy (1996); pr lap,diagnostic abdomen (N/A, 02/15/2022); pr excision/destruction open abdominal tumors 5 cm (N/A, 02/15/2022); pr part removal colon w anastomosis (N/A, 02/15/2022); pr colostomy (N/A, 02/15/2022); pr removal spleen, total (N/A, 02/15/2022); pr exploratory of abdomen (N/A, 02/15/2022); chg hyperthermia extern rx deep (N/A, 02/15/2022); pr chemotx admn pertl cavity implanted port/cath (N/A, 02/15/2022); pr ooph w/radic dissect for debulking (N/A, 02/15/2022); pr removal gallbladder (N/A, 02/15/2022); pr appendectomy (N/A, 02/15/2022); and pr removal of omentum (N/A, 02/15/2022).   Previous admit date: 01/08/2022    Primary Insurance- Payor: Advertising copywriter / Plan: UHC ([place]) / Product Type: *No Product type* /   Secondary Insurance - None  Prescription Coverage -   Preferred Pharmacy - [facility] [place], [place] - [address]  [facility]  [facility]    Transportation home: Occupational hygienist Next of Kin / Guardian / POA / Advance Directives     HCDM (patient stated preference): Manganaro,Eric - Spouse - [phone]    Advance Directive (Medical Treatment)  Does patient have an advance directive covering medical treatment?: Patient does not have advance directive covering medical treatment.  Reason patient does not have an advance directive covering medical treatment:: Patient needs follow-up to complete one.    Health Care Decision Maker [HCDM] (Medical & Mental Health Treatment)  Healthcare Decision Maker: Patient needs follow-up to appoint a Health Care Decision Maker.  Information offered on HCDM, Medical & Mental Health advance directives:: Patient given information.    Advance Directive (Mental Health Treatment)  Does patient have an advance directive covering mental health treatment?: Patient does not have advance directive covering mental health treatment.  Reason patient does not have an advance directive covering mental health treatment:: Patient does not wish to complete one at this time.    Readmission Information    Have you been hospitalized in the last 30 days?: Yes  Name of Hospital: Other (comment) [facility])  Were you being cared for at a skilled nursing facility:: No     What day were you discharged from that hospital or facility?: 01/19/22  Number of Days between previous discharge and readmission date: 15-30 days    Type of Readmission: Related to Previous Admission    Readmission Source: Home    Did the following happen with your discharge?    Patient Information  Lives with: Spouse/significant  other    Type of Residence: Private residence     Location/Detail: [address]  [place] [address]    Support Systems/Concerns: Spouse    Home Care services in place prior to admission?: No    Equipment Currently Used at Home: none     Currently receiving outpatient dialysis?: No     Financial Information     Need for financial assistance?: No     Social Determinants of Health  Social Determinants of Health     Financial Resource Strain: Low Risk  (02/17/2022)    Overall Financial Resource Strain (CARDIA)     Difficulty of Paying Living Expenses: Not very hard   Internet Connectivity: Not on file   Food Insecurity: No Food Insecurity (02/17/2022)    Hunger Vital Sign     Worried About Running Out of Food in the Last Year: Never true     Ran Out of Food in the Last Year: Never true   Tobacco Use: Low Risk  (02/16/2022)    Patient History     Smoking Tobacco Use: Never     Smokeless Tobacco Use: Never     Passive Exposure: Not on file   Housing/Utilities: Low Risk  (02/17/2022)    Housing/Utilities     Within the past 12 months, have you ever stayed: outside, in a car, in a tent, in an overnight shelter, or temporarily in someone else's home (i.e. couch-surfing)?: No     Are you worried about losing your housing?: No     Within the past 12 months, have you been unable to get utilities (heat, electricity) when it was really needed?: No   Alcohol Use: Not on file   Transportation Needs: No Transportation Needs (02/17/2022)    PRAPARE - Therapist, art (Medical): No     Lack of Transportation (Non-Medical): No   Substance Use: Not on file   Health Literacy: Not on file   Physical Activity: Not on file   Interpersonal Safety: Not on file   Stress: Not on file   Intimate Partner Violence: Not on file   Depression: Not at risk (01/25/2022)    PHQ-2     PHQ-2 Score: 0   Social Connections: Not on file     Complex Discharge Information    Is patient identified as a difficult/complex discharge?: No    Discharge Needs Assessment  Concerns to be Addressed: discharge planning    Clinical Risk Factors: New Diagnosis, Principal Diagnosis: Cancer, Stroke, COPD, Heart Failure, AMI, Pneumonia, Joint Replacment    Barriers to taking medications: No    Prior overnight hospital stay or ED visit in last 90 days: Yes    Anticipated Changes Related to Illness: none    Equipment Needed After Discharge: colostomy/ostomy supplies    Discharge Facility/Level of Care Needs:      Readmission  Risk of Unplanned Readmission Score: UNPLANNED READMISSION SCORE: 24.97%  Predictive Model Details          25% (High)  Factor Value    Calculated 02/17/2022 16:07 19% Number of active inpatient medication orders 33    [place] Risk of Unplanned Readmission Model 9% Diagnosis of cancer present     8% Active antipsychotic inpatient medication order present     8% ECG/EKG order present in last 6 months     8% Latest calcium low (8.4 mg/dL)     6% Encounter of ten days or longer in last  year present     6% Diagnosis of electrolyte disorder present     6% Imaging order present in last 6 months     5% Latest hemoglobin low (7.1 g/dL)     5% Phosphorous result present     4% Number of hospitalizations in last year 1     4% Diagnosis of deficiency anemia present     4% Active anticoagulant inpatient medication order present     3% Age 52     2% Current length of stay 2.421 days     2% Charlson Comorbidity Index 2     2% Future appointment scheduled     1% Active ulcer inpatient medication order present      Readmitted Within the Last 30 Days? (No if blank) Yes  Patient at risk for readmission?: Yes    Discharge Plan  Screen findings are: Discharge planning needs identified or anticipated (Comment). (Ostomy supplies)    Expected Discharge Date: 02/25/2022    Expected Transfer from Critical Care: 02/16/22    Quality data for continuing care services shared with patient and/or representative?: N/A  Patient and/or family were provided with choice of facilities / services that are available and appropriate to meet post hospital care needs?: N/A       Initial Assessment complete?: Yes

## 2022-02-19 LAB — BASIC METABOLIC PANEL
ANION GAP: 4 mmol/L — ABNORMAL LOW (ref 5–14)
BLOOD UREA NITROGEN: <5 mg/dL — ABNORMAL LOW (ref 9–23)
CALCIUM: 7.5 mg/dL — ABNORMAL LOW (ref 8.7–10.4)
CHLORIDE: 106 mmol/L (ref 98–107)
CO2: 29 mmol/L (ref 20.0–31.0)
CREATININE: 0.38 mg/dL — ABNORMAL LOW
EGFR CKD-EPI (2021) FEMALE: >90 mL/min/{1.73_m2} (ref >=60)
GLUCOSE RANDOM: 90 mg/dL (ref 70–179)
POTASSIUM: 3.4 mmol/L (ref 3.4–4.8)
SODIUM: 139 mmol/L (ref 135–145)

## 2022-02-19 LAB — CBC
HEMATOCRIT: 22.1 % — ABNORMAL LOW (ref 34.0–44.0)
HEMOGLOBIN: 7.2 g/dL — ABNORMAL LOW (ref 11.3–14.9)
MEAN CORPUSCULAR HEMOGLOBIN CONC: 32.4 g/dL (ref 32.0–36.0)
MEAN CORPUSCULAR HEMOGLOBIN: 27 pg (ref 25.9–32.4)
MEAN CORPUSCULAR VOLUME: 83.1 fL (ref 77.6–95.7)
MEAN PLATELET VOLUME: 8.1 fL (ref 6.8–10.7)
PLATELET COUNT: 531 10*9/L — ABNORMAL HIGH (ref 150–450)
RED BLOOD CELL COUNT: 2.66 10*12/L — ABNORMAL LOW (ref 3.95–5.13)
RED CELL DISTRIBUTION WIDTH: 17.6 % — ABNORMAL HIGH (ref 12.2–15.2)
WBC ADJUSTED: 23.2 10*9/L — ABNORMAL HIGH (ref 3.6–11.2)

## 2022-02-19 LAB — COMPREHENSIVE METABOLIC PANEL
ALBUMIN: 2.1 g/dL — ABNORMAL LOW (ref 3.4–5.0)
ALKALINE PHOSPHATASE: 122 U/L — ABNORMAL HIGH (ref 46–116)
ALT (SGPT): 13 U/L (ref 10–49)
ANION GAP: 6 mmol/L (ref 5–14)
AST (SGOT): 15 U/L (ref <=34)
BILIRUBIN TOTAL: 0.4 mg/dL (ref 0.3–1.2)
BLOOD UREA NITROGEN: <5 mg/dL — ABNORMAL LOW (ref 9–23)
CALCIUM: 7.8 mg/dL — ABNORMAL LOW (ref 8.7–10.4)
CHLORIDE: 104 mmol/L (ref 98–107)
CO2: 27 mmol/L (ref 20.0–31.0)
CREATININE: 0.41 mg/dL — ABNORMAL LOW
EGFR CKD-EPI (2021) FEMALE: >90 mL/min/{1.73_m2} (ref >=60)
GLUCOSE RANDOM: 237 mg/dL — ABNORMAL HIGH (ref 70–179)
POTASSIUM: 3.1 mmol/L — ABNORMAL LOW (ref 3.4–4.8)
PROTEIN TOTAL: 4.8 g/dL — ABNORMAL LOW (ref 5.7–8.2)
SODIUM: 137 mmol/L (ref 135–145)

## 2022-02-19 LAB — AMYLASE: AMYLASE: 36 U/L (ref 30–118)

## 2022-02-19 LAB — LIPASE: LIPASE: 23 U/L (ref 12–53)

## 2022-02-19 MED ADMIN — heparin (porcine) 5,000 unit/mL injection 5,000 Units: 5000 [IU] | SUBCUTANEOUS | @ 16:00:00 | Stop: 2022-02-21

## 2022-02-19 MED ADMIN — potassium chloride 20 mEq in 100 mL IVPB Premix: 20 meq | INTRAVENOUS | @ 13:00:00 | Stop: 2022-02-19

## 2022-02-19 MED ADMIN — dextrose 5 % and sodium chloride 0.45 % infusion: 75 mL/h | INTRAVENOUS | @ 13:00:00

## 2022-02-19 MED ADMIN — famotidine (PF) (PEPCID) injection 20 mg: 20 mg | INTRAVENOUS | @ 16:00:00

## 2022-02-19 MED ADMIN — potassium chloride 20 mEq in 100 mL IVPB Premix: 20 meq | INTRAVENOUS | @ 22:00:00 | Stop: 2022-02-19

## 2022-02-19 MED ADMIN — famotidine (PF) (PEPCID) injection 20 mg: 20 mg | INTRAVENOUS | @ 02:00:00

## 2022-02-19 MED ADMIN — heparin (porcine) 5,000 unit/mL injection 5,000 Units: 5000 [IU] | SUBCUTANEOUS | @ 07:00:00 | Stop: 2022-02-19

## 2022-02-19 MED ADMIN — acetaminophen (OFIRMEV) 10 mg/mL injection 1,000 mg: 1000 mg | INTRAVENOUS | @ 16:00:00 | Stop: 2022-02-19

## 2022-02-19 MED ADMIN — bupivacaine (PF) (MARCAINE) 0.15 % in sodium chloride (NS) 250 mL epidural: EPIDURAL | @ 12:00:00

## 2022-02-19 MED ADMIN — potassium chloride 20 mEq in 100 mL IVPB Premix: 20 meq | INTRAVENOUS | @ 20:00:00 | Stop: 2022-02-19

## 2022-02-19 MED ADMIN — HYDROmorphone (PF) injection Syrg 0.25 mg: .25 mg | INTRAVENOUS | @ 07:00:00 | Stop: 2022-03-01

## 2022-02-19 MED ADMIN — heparin (porcine) 5,000 unit/mL injection 5,000 Units: 5000 [IU] | SUBCUTANEOUS | Stop: 2022-02-21

## 2022-02-19 MED ADMIN — acetaminophen (OFIRMEV) 10 mg/mL injection 1,000 mg: 1000 mg | INTRAVENOUS | @ 07:00:00 | Stop: 2022-02-19

## 2022-02-19 NOTE — Unmapped (Signed)
Surgery Progress Note    Hospital Day: 5    Assessment:     Debra Ponce is a 52 y.o. 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 She underwent an exploratory laparotomy, low anterior resection with diverting loop ileostomy, TAH and BSO, splenectomy, cholecystectomy, and appendectomy and received IP cisplatin intraoperatively.    Interval Events:     NAE. NGT removed overnight without nausea or vomiting. Minimal gas and stool from the ileostomy.     Plan:   Postoperative pain management:  - Epidural in place, APS is managing, plan for removal on 02/21/22  - Naloxone gtt for itching.     CV: VSS    Pulm:   - SORA. Encouraging IS and ambulation today.     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     ID: No acute concerns    Ppx: SQH 5000q8 while epidural in place    Dispo  - Current level of care: Floor  - Patient will need final ostomy teaching and supplies prior to discharge   - Unlikely to discharge with drains in place - will measure drain amylase on POD3 and POD5    Objective:      Vital Signs:  BP 128/81  - Pulse 83  - Temp 36.2 ??C (97.2 ??F) (Oral)  - Resp 20  - Ht 149.9 cm (4' 11)  - Wt 56 kg (123 lb 7.3 oz)  - LMP 11/14/2020 (Approximate)  - SpO2 97%  - BMI 24.94 kg/m??     Input/Output:  I/O         01/04 0701  01/05 0700 01/05 0701  01/06 0700 01/06 0701  01/07 0700    P.O.  0 0    I.V. (mL/kg) 729.7 (13) 76.3 (1.4)     NG/GT       IV Piggyback 270      Total Intake 999.7 76.3 0    Urine (mL/kg/hr) 2295 (1.7) 2450 (1.8) 300 (2.2)    Emesis/NG output 250 250 0    Drains 150 280 25    Stool  25 200    Total Output(mL/kg) 2695 (48.1) 3005 (53.7) 525 (9.4)    Net -1695.3 -2928.8 -525           Urine Occurrence 1 x 0 x     Stool Occurrence  0 x 0 x            Physical Exam:    General: Cooperative, no distress, well appearing  HEENT:  Normocephalic, atraumatic  Pulmonary: Normal work of breathing, equal bilateral chest rise  Cardiovascular: Regular tachycardia  Abdomen: Soft, appropriately tender, mildly distended. Incisions c/d/i with dermabond in place. Drains with s/s output. Ileostomy is pink, bowel sweat and gas in the bag.  Extremities: Minimal peripheral edema    Labs:    Lab Results   Component Value Date    WBC 53.1 (HH) 02/17/2022    HGB 7.1 (L) 02/17/2022    HCT 21.3 (L) 02/17/2022    PLT 379 02/17/2022       Lab Results   Component Value Date    NA 137 02/19/2022    K 3.1 (L) 02/19/2022    CL 104 02/19/2022    CO2 27.0 02/19/2022    BUN <5 (L) 02/19/2022    CREATININE 0.41 (L) 02/19/2022    CALCIUM 7.8 (L) 02/19/2022    MG 1.5 (L) 02/18/2022    PHOS 2.8 02/18/2022       Microbiology Results (last day)       Procedure Component Value Date/Time Date/Time    Amylase, body fluid [[id number]] Collected: 02/16/22 0350    Lab Status: Final result Specimen: Fluid, Peritoneal Updated: 02/16/22 0451     Amylase, Fluid 214 U/L      Comment: Peritional fluid amylase >3-5X serum amylase suggests pancreatic ascities.        Amylase, Fluid Type Fluid, Peritoneal    Narrative:      The reference range for this body fluid has not been established. The test result must be integrated into the clinical context for interpretation.  This test was developed and its performance characteristics determined by the [facility] of the [employer] [employer]. This test has not been cleared or approved by the FDA. The laboratory is regulated under CAP and CLIA as qualified to perform high-complexity testing. This test is to be used for clinical purposes and should not be regarded as investigational or for research.            Imaging:   ECG 12 Lead    Result Date: 02/18/2022  SINUS RHYTHM WITH OCCASIONAL PREMATURE VENTRICULAR BEATS NON-SPECIFIC ST/T WAVE CHANGES ABNORMAL ECG WHEN COMPARED WITH ECG OF 27-Jan-2022 12:11, PREMATURE VENTRICULAR BEATS ARE NOW PRESENT VENT. RATE HAS DECREASED BY  52 BPM     Confirmed by Christella Noa ([id number]) on 02/18/2022 11:34:17 AM

## 2022-02-19 NOTE — Unmapped (Signed)
Patient alert and oriented. Pt has Epidural infusing at 47mL/hr. Dilaudid PRN given x1 for pain. Pt stated relief. Pt denies n/v. NGT removed. Pt remains NPO. JP drains x2, CDI. Ostomy intact with output, pt stated gas started to form from ostomy through the night. Pt voiding, using bedside commode. Pt able to ambulate to  bedside commode. Pt remains free of falls. Call bell within reach and bed in lowest position.     Problem: Wound  Goal: Optimal Coping  Outcome: Progressing  Goal: Optimal Functional Ability  Outcome: Progressing  Goal: Absence of Infection Signs and Symptoms  Outcome: Progressing  Goal: Optimal Pain Control and Function  Outcome: Progressing  Goal: Skin Health and Integrity  Outcome: Progressing  Goal: Optimal Wound Healing  Outcome: Progressing

## 2022-02-19 NOTE — Unmapped (Signed)
Pt A&O. Family at bedside during shift. Magnesium and potassium replaced. Pt ambulates in room and unit. JP x2, patent and draining. Ileostomy w/brown liquid, WOC RN will come tomorrow and work w/pt. NGT in morning, pt anxious about and throat hurting; removed by Dr. Vanessa Ralphs at end of shift. Pain controlled w/current regiment. NPO. Safety maintained, call bell in reach.   Problem: Adult Inpatient Plan of Care  Goal: Plan of Care Review  Outcome: Ongoing - Unchanged  Goal: Patient-Specific Goal (Individualized)  Outcome: Ongoing - Unchanged  Goal: Absence of Hospital-Acquired Illness or Injury  Outcome: Ongoing - Unchanged  Intervention: Identify and Manage Fall Risk  Recent Flowsheet Documentation  Taken 02/18/2022 0855 by Shari Heritage, RN  Safety Interventions:   aspiration precautions   fall reduction program maintained   infection management   lighting adjusted for tasks/safety   low bed   muscle strengthening facilitated   nonskid shoes/slippers when out of bed   supervised activity   toileting scheduled  Intervention: Prevent Skin Injury  Recent Flowsheet Documentation  Taken 02/18/2022 0855 by Shari Heritage, RN  Positioning for Skin: Supine/Back  Device Skin Pressure Protection: absorbent pad utilized/changed  Skin Protection:   adhesive use limited   incontinence pads utilized   protective footwear used   transparent dressing maintained  Intervention: Prevent and Manage VTE (Venous Thromboembolism) Risk  Recent Flowsheet Documentation  Taken 02/18/2022 0855 by Shari Heritage, RN  VTE Prevention/Management:   ambulation promoted   anticoagulant therapy   intravenous hydration  Anti-Embolism Device Type: SCD, Knee  Anti-Embolism Intervention: On  Anti-Embolism Device Location: BLE  Intervention: Prevent Infection  Recent Flowsheet Documentation  Taken 02/18/2022 0855 by Shari Heritage, RN  Infection Prevention:   hand hygiene promoted   personal protective equipment utilized   rest/sleep promoted   single patient room provided  Goal: Optimal Comfort and Wellbeing  Outcome: Ongoing - Unchanged  Goal: Readiness for Transition of Care  Outcome: Ongoing - Unchanged  Goal: Rounds/Family Conference  Outcome: Ongoing - Unchanged

## 2022-02-19 NOTE — Unmapped (Signed)
GYN ONC PROGRESS NOTE    ASSESSMENT AND PLAN     Hospital Day: 5    Debra Ponce is a 51 y.o. POD#4 s/p EUA, exploratory laparotomy, TAH w/ radical dissection, b/l ureterolysis, BSO, posterior cul-de-sac peritonectomy, tumor debulking (Gyn Onc); diagnostic laparoscopy, LAR w primary end-to-end anastomosis, enterolysis, cholecystectomy, tumor capsule tumor debulking, splenectomy, appendectomy, omentectomy, diverting loop ileostomy formation with HIPEC (Surg Onc) on 02/15/21 for Stage IIIC mixed germ cell tumor. Patient is surgical oncology primary.     ONC: Stage IIIC mixed germ cell tumor  - Primary Oncologist: Dr. Pricilla Holm  - Last treatment: Bleomycin, Etoposide, Cisplatin on 11/28 (s/p 2C)  - Last CA-125: 469  - S/p above procedure: final path pending    Postoperative Care:  - H/H: 8.1 > EBL 1000 mL > 3u pRBCs intra-op >> 7.1, recommend transfusing 1u pRBC for goal of hgb > 8 given anastomoses and future treatment   - ERAS protocol for pain: sch tylenol, PRN Dilaudid; epidural   - On RA  - mIVF at 183mL/hr   - NGT removed yesterday > NPO management per primary team  - Foley removed, urinating without difficulty   - S/p splenectomy; POD3 amylase improved > will obtain POD 5 drain amylase; will need post splenectomy vaccines prior to discharge   - WOCN following for ostomy teaching    Chronic Medical Conditions  - GAD: Home prozac 10 mg, ativan PRN held  - Sleep disturbance: recommend starting atarax nightly     PPX: SCDs, heparin, IS, ambulation    Dispo: ICU    Plan discussed with Dr. Jonny Ruiz and Attending Dr. Ezzard Flax.    SUBJECTIVE     No acute events overnight. Patient doing much better this morning not that the NGT is out. She reports that she was able to sleep some last night. She was asking for atarax to help with sleep. She is not nauseated. She walked yesterday. Urinating without difficulty.     OBJECTIVE     Temp:  [36 ??C (96.8 ??F)-36.7 ??C (98.1 ??F)] 36.7 ??C (98.1 ??F)  Heart Rate:  [89-101] 90  Resp:  [16-20] 20  BP: (125-145)/(76-89) 128/86  SpO2:  [97 %-98 %] 97 %    Gen: NAD, awake, alert, oriented  CV: regular rate and rhythm   Pulm: clear to auscultation bilaterally, normal work of breathing  Abd: Soft, appropriately tender to palpation, non-distended, bowel sounds present; Ostomy with healthy appearing bowel, thin brown, gas output in bag. 2 JP drains with serosanguinous fluid.   Incision: midline vertical incision clean, dry, intact closed with suture, with no surrounding erythema or induration  GU: Foley removed      Intake/Output Summary (Last 24 hours) at 02/19/2022 0701  Last data filed at 02/19/2022 0400  Gross per 24 hour   Intake 76.25 ml   Output 3005 ml   Net -2928.75 ml         Medications (scheduled)    acetaminophen  1,000 mg Intravenous Q8H    famotidine (PEPCID) IV  20 mg Intravenous BID    heparin (porcine) for subcutaneous use  5,000 Units Subcutaneous Q8H SCH    insulin regular  0-20 Units Subcutaneous Q6H SCH    potassium chloride in water  20 mEq Intravenous Once    Followed by    potassium chloride in water  20 mEq Intravenous Once    Followed by    potassium chloride in water  20 mEq Intravenous Once    scopolamine  1 patch Topical Q72H       Medications (prn)  dextrose in water, glucagon, HYDROmorphone **OR** HYDROmorphone, naloxone, ondansetron, phenol, prochlorperazine (COMPAZINE) 5 mg in sodium chloride (NS) 0.9 % 25 mL IVPB    Labs:  Lab Results   Component Value Date    WBC 53.1 (HH) 02/17/2022    HGB 7.1 (L) 02/17/2022    HCT 21.3 (L) 02/17/2022    PLT 379 02/17/2022       Lab Results   Component Value Date    NA 137 02/19/2022    K 3.1 (L) 02/19/2022    CL 104 02/19/2022    CO2 27.0 02/19/2022    BUN <5 (L) 02/19/2022    CREATININE 0.41 (L) 02/19/2022    GLU 237 (H) 02/19/2022    CALCIUM 7.8 (L) 02/19/2022    MG 1.5 (L) 02/18/2022    PHOS 2.8 02/18/2022       Lab Results   Component Value Date    BILITOT 0.4 02/19/2022    BILIDIR 0.30 01/10/2022    PROT 4.8 (L) 02/19/2022    ALBUMIN 2.1 (L) 02/19/2022    ALT 13 02/19/2022    AST 15 02/19/2022    ALKPHOS 122 (H) 02/19/2022       Lab Results   Component Value Date    INR 1.42 01/09/2022    APTT 33.4 01/09/2022        Scribe's Attestation: Electa Sniff, MD obtained and performed the history, physical exam and medical decision making elements that were  entered into the chart. Documentation assistance was provided by me personally, a scribe. Signed by Marcy Siren Scribe, on February 19, 2022 at 7:03 AM.     --------------------------------------------------------------------------------------------------------------------------------------------------------------------------------------------------------------------------------------------------------------  February 19, 2022 8:11 AM Documentation assistance provided by the Scribe. I was present during the time the encounter was recorded. The information recorded by the Scribe was done at my direction and has been reviewed and validated by me.  ---------------------------------------------------------------------------------------------------------------------------------------------------------------------------------------------------------------------------------------------------------------

## 2022-02-19 NOTE — Unmapped (Signed)
Adult Nutrition Assessment Note    Visit Type: RN Consult  Reason for Visit: Per Admission Nutrition Screen (Adult), Have you gained or lost 10 pounds in the past 3 months?, Have you had a decrease in food intake or appetite?      HPI & PMH:  Per EMR review - Debra Ponce is a 52 y.o. 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 She underwent an exploratory laparotomy, low anterior resection with diverting loop ileostomy, TAH and BSO, splenectomy, cholecystectomy, and appendectomy and received IP cisplatin intraoperatively.       Anthropometric Data:  Height: 149.9 cm (4' 11)   Admission weight: 56 kg (123 lb 7.3 oz)  Last recorded weight: 56 kg (123 lb 7.3 oz)  IBW: 45.45 kg  Percent IBW: 123.21 %  BMI: Body mass index is 24.94 kg/m??.   Usual Body Weight: 115 lb    Weight history prior to admission: Patient reports fluctuating weight over the past 1.5 year, with some gains being attributed to tumor growth. Consider rechecking current weight, as this does not appear consistent with reported intakes/time frame.  Wt Readings from Last 10 Encounters:   02/17/22 56 kg (123 lb 7.3 oz)   02/04/22 49.4 kg (109 lb)   02/04/22 49.5 kg (109 lb 1.6 oz)   01/27/22 49.3 kg (108 lb 9.6 oz)   01/25/22 48.6 kg (107 lb 3.2 oz)   01/19/22 49.1 kg (108 lb 4.8 oz)        Weight changes this admission:   Last 5 Recorded Weights    02/17/22 0636   Weight: 56 kg (123 lb 7.3 oz)        Nutrition Focused Physical Exam:  Nutrition Focused Physical Exam:  Fat Areas Examined  Orbital: Mild loss  Upper Arm: Moderate loss      Muscle Areas Examined  Temple: Moderate loss  Clavicle: Moderate loss  Acromion: Moderate loss  Patellar: Moderate loss  Anterior Thigh: Moderate loss              Nutrition Evaluation  Overall Impressions: Moderate fat loss;Moderate muscle loss (02/19/22 1719)  Nutrition Designation: Normal weight (BMI 18.50 - 24.99 kg/m2) (02/19/22 1719)      NUTRITIONALLY RELEVANT DATA     Medications:   Nutritionally pertinent medications reviewed and evaluated for potential food and/or medication interactions and include D51/2NS at 75 ml/hr  , insulin, Pepcid, KCl    Labs:   Nutritionally pertinent labs reviewed and include K+: 3.1 mmol/L, Magnesium: 1.5 mg/dL, and Glucose: 161 mg/dL  Point of Care Glucose: 92, 102, 91, 90, 93 (last five 1/4-1/6)    Nutrition History:   February 19, 2022: Prior to admission: Patient reports appetite/intake fluctuations due to chemotherapy. Patient states her dietary intake was low after chemo and began to improve over the past month until recent surgery. Patient states that she does not follow any special diet at home; received ostomy education with dietary recommendations since placement. Patient is currently NPO, having ice chips only at this time. 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 Patient endorses some nausea/no emesis.    Allergies, Intolerances, Sensitivities, and/or Cultural/Religious Dietary Restrictions: none identified per chart review at this time     Current Nutrition:  NPO         Nutritional Needs:   Daily Estimated Nutrient Needs:  Energy: 0960-4540 kcal/day (BMR x 1.2-1.4) kcals Per [place] St-Jeor Equation using admission body weight, 56  kg (02/19/22 1118)]  Protein: 82-95 gm/day gm [25% of kcal using admission body weight, 56 kg (02/19/22 1118)]  Carbohydrate:   [no restriction]  Fluid:   mL [1 mL/kcal (maintenance)]      Malnutrition Assessment using AND/ASPEN Clinical Characteristics:    Non-severe (Moderate) Protein-Calorie Malnutrition in the context of chronic illness (02/19/22 1726)  Muscle Loss: Moderate  Fluid Accumulation: Moderate  Malnutrition Score: 2    GOALS and EVALUATION     Patient to remain NPO and/or on a clear liquid diet less than 5 days before diet advancement. - New    Motivation, Barriers, and Compliance:  Evaluation of motivation, barriers, and compliance completed. No concerns identified at this time.     NUTRITION ASSESSMENT     Current  nutrition therapy is appropriate although not meeting nutritional needs at this time due to NPO status.       Discharge Planning:   Monitor for potential discharge needs with multi-disciplinary team.     Was the nutrition care plan completed? Yes      NUTRITION INTERVENTIONS and RECOMMENDATION     Once medically feasible, advance diet as tolerated- Surgical GI.  Monitor lytes (K+, Mg); replete as needed.  Weekly weights.    Follow-Up Parameters:   1-2 times per week (and more frequent as indicated)    Lawayne Hartig L. Noralyn Pick, MS, RD, LDN  Cross-coverage Dietitian

## 2022-02-19 NOTE — Unmapped (Signed)
Department of Anesthesiology  Pain Division  Acute Pain Pager [phone]        Acute Pain Service Follow-Up Note- Epidural      Assessment and Plan  Patient is a 52 y.o. s/p cytoreductive surgery and HIPEC who is now POD# 4 currently with an epidural catheter for postoperative pain.    Interval updates:   Pain remains well controlled with epidural with infrequent use of PRN dilaudid. Greatly relieved to have NG out. Has been able to ambulate and has some ostomy output. Discussed possible removal of epidural tomorrow with surgical team and they would like to keep in place until Monday. Barring any changes in status, will plan to leave epidural in place until then.      Catheter status/changes: Epidural working well, will continue epidural without changes.  Blood thinner: heparin 5000u SQ  Additional pain/sedation medications: Being managed by the Acute Pain Service.  See changes below   Current diet:   Active Orders   Diet    NPO Sips with meds; Medically necessary       Recommendations:  - Continue 0.15% bupivacaine plain epidural at 6 ml/hr  - Continue scheduled IV tylenol  - Continue PRN IV dilaudid    We will continue to follow.  Contact the Acute Pain Service first at [phone] with questions or concerns 24 hours a day.  If unable to reach, please contact on call Anesthesiology resident at [phone].     If applicable, naloxone available for sedation or respiratory depression.  If applicable, nalbuphine available for refractory pruritis.  Call APS for additional concerns.  No anesthesia/epidural need for foley catheter.  Contact APS before starting any anticoagulant other than [place] heparin.    CPT  daily management of epidural 21308   Diagnosis: abdominal pain      History  Patient is a 52 y.o. s/p cytoreductive surgery and HIPEC on 02/15/22 by Dr. Pricilla Holm and Vanessa Ralphs.    Daily Hospital Course  There were no Acute Events Overnight.   The patient is obtaining adequate pain relief on current medication regimen and feels that their pain is Well controlled    Epidural Catheter: Currently running  Level: T8/9  LOR:   4.5 cm  Catheter at skin:  9.5 cm  Catheter Start Date: 02/15/22  Postop/Catheter Day: 3    Analgesia Evaluation:  Pain at rest: 0/10  Pain with activity: 4/10    Infusion: 0.15% bupivacaine  Continuous Infusion Rate: 52ml/hour  PCEA Settings: 2ml every 30  minutes    Additional opioid and non-opioid analgesics:   IV acetaminophen 650 mg q6h  IV hydromorphone 0.25-0.5 mg PRN    Complications:  Pruritis: no  Urinary Retention: no  Nausea: No  Vomiting: No  Sedation: No  Respiratory Depression: No    Physical Exam  Temp:  [36 ??C (96.8 ??F)-36.7 ??C (98.1 ??F)] 36.2 ??C (97.2 ??F)  Heart Rate:  [83-101] 83  Resp:  [16-20] 20  BP: (125-145)/(76-89) 128/81  MAP (mmHg):  [92-101] 96  SpO2:  [97 %-98 %] 97 %  GEN: Patient sitting up in bed, in no apparent distress.  NG is present.  CHEST: Unlabored breathing, supplemental oxygen is not present.  Chest tube is not present.  SKIN: epidural catheter dressing clean, dry, intact, no erythema at insertion site, no fluctuance or tenderness noted  GENITOURINARY: foley catheter is present  NEUROLOGIC: patient moves all extremities, 5/5 strength in lower extremities, alert and oriented to person, place and time; other deficits noted:  None    Lab Results   Component Value Date    WBC 53.1 (HH) 02/17/2022    RBC 2.60 (L) 02/17/2022    HGB 7.1 (L) 02/17/2022    HCT 21.3 (L) 02/17/2022    MCV 82.1 02/17/2022    MCH 27.3 02/17/2022    MCHC 33.3 02/17/2022    RDW 17.7 (H) 02/17/2022    PLT 379 02/17/2022    MPV 6.8 02/17/2022     Lab Results   Component Value Date    PT 15.7 (H) 01/09/2022    INR 1.42 01/09/2022     Lab Results   Component Value Date    CREATININE 0.41 (L) 02/19/2022

## 2022-02-19 NOTE — Unmapped (Addendum)
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 She underwent an exploratory laparotomy, low anterior resection with diverting loop ileostomy, TAH and BSO, splenectomy, cholecystectomy, and appendectomy and received cisplatin intraoperatively.     CWON follow up appointment with pt and husband (hsb) for pouch assessment and full teach.  Pt's appliance placed by nurse on 1/4 is intact without leakage.  On removal, noted hydration at 3 O'clock location on wafer, where pt has a soft body divot going towards midline.  So we switched her to a soft convex red wafer.  Had patient demonstrate emptying her appliance in toilet, which she did 100% with minimal verbal cueing by ostomy nurse.  Pt changed her appliance with ostomy nurse providing demonstration and moderate coaching.  Pt challenged to get around the retention rods as they extend just outside the cuttable surface of her ostomy wafer.  Pt was able to remove her appliance, measure her ostomy, cut the wafer and ostomy nurse had to land her wafer and get up under the retention rods.      We will follow up tomorrow for continued teaching and appliance evaluation.    Ileostomy Teaching:  Educated patient on the following subjects:  -      Anatomical changes from surgery  -      Assessing ostomy and expected changes over 6-8 weeks  -      Skin care  -      Emptying appliance  -      Risks of dehydration; s/sx dehydration; proper response to dehydration  -      Risk of blockages; identifying blockages and proper response to address  -      Foods that affect ileostomy management: odor; thickeners/thinners; gas formation  -      burping appliance  -      Showering, swimming, ADLs  -      Changing appliance, including  -      Removing wafer  -      Cleaning skin  -      Crusting impaired skin (if needed) using stomahesive powder and skin prep   -      Measuring and cutting ostomy wafer  -      Applying barrier ring or using stomahesive paste  -      Applying pouch  -      Attaching new pouch (if 2 piece)  -      Mucous plug (not done today)  -      Contacting vendors for sample supplies and assistance with pouching difficulties after discharge         Had patient demonstrate the following: with min-mod verbal coaching and positive return demonstration.  -      Emptying pouch  -      Changing appliance, including  -      Burping appliance  -      Removing wafer  -      Cleaning skin  -      Shaving peristomal hair (not done today)  -      Crusting impaired skin as needed using  stomahesive powder and skin prep  -      Measuring and cutting ostomy wafer  -      Applying barrier ring or using stomahesive paste  -      Applying pouch  -      Attaching new pouch (if 2 piece)      Stoma Type:  -  Ileostomy Stoma Location:  - RLQ (Right Lower Quadrant)     Stoma Characteristics:  - Round  - Budded Stoma Mucosal Condition and Color:  - Moist  - Edematous  - Red     Mucocutaneous Junction:  - Not able to assess at this time, pouch not removed    Rod/Stents:  - No     Output:  - Debra Ponce  - Liquid  - Stool     Peristomal Skin Condition:   - Not able to assess at this time, pouch not removed    Abdominal Contours:  - Soft    Pouching System:  - 2 Piece  - Convex  - CTF (Cut to fit)  - Moldable barrier ring Anticipated Wear Time of Pouching System:  - To be determined     Teaching Limitations/Considerations:   - Anxiety  - Drowsy    Teaching/Instructions:  - Discussed agreed upon goals that the patient/family member would be able to empty and change ostomy pouch, have the instructions needed to order home supplies and know how to trouble shoot pouch leakage and common peristomal skin problems.    Ostomy Home Starter Kit - Verbal Consent Obtained:  - Not asked    Recommendations/Plan:   - Patient will need more ostomy teaching prior to discharge, WOC nurse will continue to follow.  - WOC nurse will follow up in 1 days for pouch evaluation.  - Unit to order discharge ostomy supplies.  - If pouch leaks, Change with bedside supplies.  - Pending discharge ostomy supply list.    Ostomy Discharge Goals:  - Patient/family member able to empty pouch independently.  - Patient/family member able to change pouch independently.  - Patient/family member able to identify ostomy supplies to take home at discharge and knowledge of supply order process.     Recommended Consults:   - Not Applicable Plan of Care Discussed With:  - Patient  - Family  - RN [name]     Ostomy Supplies:   - Supplies available on unit.  - Unit to order.  - Patient specific supplies at bedside.    Ostomy Product List:  Inpatient supply list: take home quantity listed     Pouch System:  [employer] 2-Piece Soft Convex Wafer Red (mfg [id number]) - [facility] number [phone]) order 5  [employer] 2-Piece Fecal Pouch Red (mfg 18003) - [facility] number [phone]) order 5    Accessories:   [employer] (mfg [id number])- [facility] number [phone]) order 5  [employer]enta ConvaTec Adhesive Remover Wipes (mfg [id number])- [facility] number [id number]) order 1 box  [employer] Ostomy Belt 23-43 (mfg 7300) - [facility] number [phone]) order 1  23M No-Sting Barrier Film- Spray (mfg 3346) - [facility] number ([id number]) order 1     WOCN Follow-up:  - Follow up on 1/6      Workup Time:  75 minutes      Assessment, Education, Supplies, Direct care, Charting      ReConsult or call for questions, new concerns or wound deterioration  Debra Ponce, [address]  [phone]

## 2022-02-20 LAB — COMPREHENSIVE METABOLIC PANEL
ALBUMIN: 1.8 g/dL — ABNORMAL LOW (ref 3.4–5.0)
ALKALINE PHOSPHATASE: 149 U/L — ABNORMAL HIGH (ref 46–116)
ALT (SGPT): 13 U/L (ref 10–49)
ANION GAP: 6 mmol/L (ref 5–14)
AST (SGOT): 17 U/L (ref <=34)
BILIRUBIN TOTAL: 0.3 mg/dL (ref 0.3–1.2)
BLOOD UREA NITROGEN: <5 mg/dL — ABNORMAL LOW (ref 9–23)
CALCIUM: 7.1 mg/dL — ABNORMAL LOW (ref 8.7–10.4)
CHLORIDE: 108 mmol/L — ABNORMAL HIGH (ref 98–107)
CO2: 25 mmol/L (ref 20.0–31.0)
CREATININE: 0.4 mg/dL — ABNORMAL LOW
EGFR CKD-EPI (2021) FEMALE: >90 mL/min/{1.73_m2} (ref >=60)
GLUCOSE RANDOM: 262 mg/dL — ABNORMAL HIGH (ref 70–179)
POTASSIUM: 3.3 mmol/L — ABNORMAL LOW (ref 3.4–4.8)
PROTEIN TOTAL: 4.4 g/dL — ABNORMAL LOW (ref 5.7–8.2)
SODIUM: 139 mmol/L (ref 135–145)

## 2022-02-20 LAB — CBC
HEMATOCRIT: 20.7 % — ABNORMAL LOW (ref 34.0–44.0)
HEMOGLOBIN: 6.8 g/dL — ABNORMAL LOW (ref 11.3–14.9)
MEAN CORPUSCULAR HEMOGLOBIN CONC: 32.9 g/dL (ref 32.0–36.0)
MEAN CORPUSCULAR HEMOGLOBIN: 27.8 pg (ref 25.9–32.4)
MEAN CORPUSCULAR VOLUME: 84.5 fL (ref 77.6–95.7)
MEAN PLATELET VOLUME: 7.8 fL (ref 6.8–10.7)
PLATELET COUNT: 532 10*9/L — ABNORMAL HIGH (ref 150–450)
RED BLOOD CELL COUNT: 2.45 10*12/L — ABNORMAL LOW (ref 3.95–5.13)
RED CELL DISTRIBUTION WIDTH: 18 % — ABNORMAL HIGH (ref 12.2–15.2)
WBC ADJUSTED: 19.2 10*9/L — ABNORMAL HIGH (ref 3.6–11.2)

## 2022-02-20 LAB — LIPASE: LIPASE: 22 U/L (ref 12–53)

## 2022-02-20 LAB — AMYLASE: AMYLASE: 31 U/L (ref 30–118)

## 2022-02-20 MED ADMIN — potassium chloride 20 mEq in 100 mL IVPB Premix: 20 meq | INTRAVENOUS | @ 14:00:00 | Stop: 2022-02-20

## 2022-02-20 MED ADMIN — heparin (porcine) 5,000 unit/mL injection 5,000 Units: 5000 [IU] | SUBCUTANEOUS | @ 14:00:00 | Stop: 2022-02-20

## 2022-02-20 MED ADMIN — HYDROmorphone (PF) injection Syrg 0.5 mg: .5 mg | INTRAVENOUS | @ 08:00:00 | Stop: 2022-03-01

## 2022-02-20 MED ADMIN — potassium chloride 20 mEq in 100 mL IVPB Premix: 20 meq | INTRAVENOUS | @ 17:00:00 | Stop: 2022-02-20

## 2022-02-20 MED ADMIN — hydrOXYzine (ATARAX) tablet 25 mg: 25 mg | ORAL | @ 01:00:00

## 2022-02-20 MED ADMIN — acetaminophen (TYLENOL) tablet 650 mg: 650 mg | ORAL | @ 19:00:00

## 2022-02-20 MED ADMIN — heparin (porcine) 5,000 unit/mL injection 5,000 Units: 5000 [IU] | SUBCUTANEOUS | @ 06:00:00 | Stop: 2022-02-20

## 2022-02-20 MED ADMIN — famotidine (PF) (PEPCID) injection 20 mg: 20 mg | INTRAVENOUS | @ 14:00:00

## 2022-02-20 MED ADMIN — heparin (porcine) 5,000 unit/mL injection 5,000 Units: 5000 [IU] | SUBCUTANEOUS | @ 23:00:00 | Stop: 2022-02-20

## 2022-02-20 MED ADMIN — potassium chloride 20 mEq in 100 mL IVPB Premix: 20 meq | INTRAVENOUS | @ 16:00:00 | Stop: 2022-02-20

## 2022-02-20 MED ADMIN — dextrose 5 % and sodium chloride 0.45 % infusion: 75 mL/h | INTRAVENOUS | @ 02:00:00

## 2022-02-20 MED ADMIN — acetaminophen (TYLENOL) tablet 650 mg: 650 mg | ORAL | @ 14:00:00

## 2022-02-20 MED ADMIN — famotidine (PF) (PEPCID) injection 20 mg: 20 mg | INTRAVENOUS | @ 01:00:00

## 2022-02-20 NOTE — Unmapped (Signed)
Pt A&O. Epidural in place. Port to right chest wall, flushed, infusing, blood return noted. NPO w/ice chips. Potassium replaced today. Voiding, had 1 episode w/stool in it, MD notified, came and talked to pt. JP drain x2 w/serous output. Dr. Pricilla Holm in to speak w/pt in the AM. WOC RN in to teach pt about ostomy. Ambulates in room and hall. Family at bedside during shift. Safety maintained, call bell in reach.   Problem: Adult Inpatient Plan of Care  Goal: Plan of Care Review  Outcome: Ongoing - Unchanged  Goal: Patient-Specific Goal (Individualized)  Outcome: Ongoing - Unchanged  Goal: Absence of Hospital-Acquired Illness or Injury  Outcome: Ongoing - Unchanged  Intervention: Identify and Manage Fall Risk  Recent Flowsheet Documentation  Taken 02/19/2022 0810 by Shari Heritage, RN  Safety Interventions:   fall reduction program maintained   family at bedside   infection management   lighting adjusted for tasks/safety   muscle strengthening facilitated   nonskid shoes/slippers when out of bed   supervised activity   toileting scheduled  Intervention: Prevent Skin Injury  Recent Flowsheet Documentation  Taken 02/19/2022 0810 by Shari Heritage, RN  Positioning for Skin: Supine/Back  Device Skin Pressure Protection: absorbent pad utilized/changed  Skin Protection:   adhesive use limited   incontinence pads utilized   protective footwear used   transparent dressing maintained  Intervention: Prevent and Manage VTE (Venous Thromboembolism) Risk  Recent Flowsheet Documentation  Taken 02/19/2022 0810 by Shari Heritage, RN  VTE Prevention/Management:   ambulation promoted   anticoagulant therapy   intravenous hydration  Anti-Embolism Device Type: SCD, Knee  Anti-Embolism Intervention: On  Anti-Embolism Device Location: BLE  Intervention: Prevent Infection  Recent Flowsheet Documentation  Taken 02/19/2022 0810 by Shari Heritage, RN  Infection Prevention:   hand hygiene promoted   personal protective equipment utilized rest/sleep promoted   single patient room provided  Goal: Optimal Comfort and Wellbeing  Outcome: Ongoing - Unchanged  Goal: Readiness for Transition of Care  Outcome: Ongoing - Unchanged  Goal: Rounds/Family Conference  Outcome: Ongoing - Unchanged

## 2022-02-20 NOTE — Unmapped (Signed)
Department of Anesthesiology  Pain Division  Acute Pain Pager [phone]        Acute Pain Service Follow-Up Note- Epidural      Assessment and Plan  Patient is a 52 y.o. s/p cytoreductive surgery and HIPEC who is now POD# 4 currently with an epidural catheter for postoperative pain.    Interval updates:   Pain well controlled overall. One episode of breakthrough pain overnight relieved by PRNs.      Catheter status/changes: Epidural working well, will continue epidural without changes.  Blood thinner: heparin 5000u SQ  Additional pain/sedation medications: Being managed by the Acute Pain Service.  See changes below   Current diet:   Active Orders   Diet    Nutrition Therapy Clear Liquid       Recommendations:  - Continue 0.15% bupivacaine plain epidural at 6 ml/hr  - Continue scheduled IV tylenol  - Continue PRN IV dilaudid    We will continue to follow.  Contact the Acute Pain Service first at [phone] with questions or concerns 24 hours a day.  If unable to reach, please contact on call Anesthesiology resident at [phone].     If applicable, naloxone available for sedation or respiratory depression.  If applicable, nalbuphine available for refractory pruritis.  Call APS for additional concerns.  No anesthesia/epidural need for foley catheter.  Contact APS before starting any anticoagulant other than [place] heparin.    CPT  daily management of epidural 21308   Diagnosis: abdominal pain      History  Patient is a 52 y.o. s/p cytoreductive surgery and HIPEC on 02/15/22 by Dr. Pricilla Holm and Vanessa Ralphs.    Daily Hospital Course  There were no Acute Events Overnight.   The patient is obtaining adequate pain relief on current medication regimen and feels that their pain is Well controlled    Epidural Catheter: Currently running  Level: T8/9  LOR:   4.5 cm  Catheter at skin:  9.5 cm  Catheter Start Date: 02/15/22  Postop/Catheter Day: 5    Analgesia Evaluation:  Pain at rest: 0/10  Pain with activity: 4/10    Infusion: 0.15% bupivacaine  Continuous Infusion Rate: 82ml/hour  PCEA Settings: 2ml every 30  minutes    Additional opioid and non-opioid analgesics:   IV acetaminophen 650 mg q6h  IV hydromorphone 0.25-0.5 mg PRN    Complications:  Pruritis: no  Urinary Retention: no  Nausea: No  Vomiting: No  Sedation: No  Respiratory Depression: No    Physical Exam  Temp:  [36.6 ??C (97.9 ??F)-37.3 ??C (99.1 ??F)] 37.3 ??C (99.1 ??F)  Heart Rate:  [80-100] 87  Resp:  [16-20] 16  BP: (129-133)/(77-85) 133/77  MAP (mmHg):  [93-99] 93  SpO2:  [97 %-100 %] 100 %  GEN: Patient sitting up in bed, in no apparent distress.  NG is present.  CHEST: Unlabored breathing, supplemental oxygen is not present.  Chest tube is not present.  SKIN: epidural catheter dressing clean, dry, intact, no erythema at insertion site, no fluctuance or tenderness noted  GENITOURINARY: foley catheter is not present  NEUROLOGIC: patient moves all extremities, 5/5 strength in lower extremities, alert and oriented to person, place and time; other deficits noted: None    Lab Results   Component Value Date    WBC 19.2 (H) 02/20/2022    RBC 2.45 (L) 02/20/2022    HGB 6.8 (L) 02/20/2022    HCT 20.7 (L) 02/20/2022    MCV 84.5 02/20/2022    MCH  27.8 02/20/2022    MCHC 32.9 02/20/2022    RDW 18.0 (H) 02/20/2022    PLT 532 (H) 02/20/2022    MPV 7.8 02/20/2022     Lab Results   Component Value Date    PT 15.7 (H) 01/09/2022    INR 1.42 01/09/2022     Lab Results   Component Value Date    CREATININE 0.40 (L) 02/20/2022

## 2022-02-20 NOTE — Unmapped (Signed)
GYN ONC PROGRESS NOTE    ASSESSMENT AND PLAN     Hospital Day: 6    Debra Ponce is a 52 y.o. 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 Patient is surgical oncology primary.     ONC: Stage IIIC mixed germ cell tumor  - Primary Oncologist: Dr. Pricilla Holm  - Last treatment: Bleomycin, Etoposide, Cisplatin on 11/28 (s/p 2C)  - Last CA-125: 469  - S/p above procedure: final path pending    Postoperative Care:  - H/H: 8.1 > EBL 1000 mL > 3u pRBCs intra-op >> 7.1, recommend transfusing 1u pRBC for goal of hgb > 8 given anastomoses and future treatment   - ERAS protocol for pain: sch tylenol, PRN Dilaudid; epidural   - On RA  - mIVF at 41mL/hr   - NGT (1/2-1/5) > NPO management per primary team, nutrition following   - Foley removed, urinating without difficulty   - S/p splenectomy; POD3 amylase improved > will obtain POD 5 drain amylase; will need post splenectomy vaccines prior to discharge   - WOCN following for ostomy teaching    Chronic Medical Conditions  - GAD: Home prozac 10 mg, ativan PRN held  - Sleep disturbance: recommend starting atarax nightly     PPX: SCDs, heparin, IS, ambulation    Dispo: ICU    Plan discussed with Dr. Jonny Ruiz and Attending Dr. Ezzard Flax.    SUBJECTIVE     NAEON. Patient feels well this morning. Denies pain. Reports having a bowel movement and gas in her ostomy bag. She is hoping to have a diet today.    OBJECTIVE     Temp:  [36.2 ??C (97.2 ??F)-37.3 ??C (99.1 ??F)] 37.2 ??C (99 ??F)  Heart Rate:  [80-100] 100  Resp:  [16-20] 16  BP: (128-133)/(78-85) 129/78  SpO2:  [97 %-100 %] 97 %    Gen: NAD, awake, alert, oriented  CV: regular rate and rhythm   Pulm: clear to auscultation bilaterally, normal work of breathing  Abd: Soft, appropriately tender to palpation, non-distended, bowel sounds present; Ostomy with healthy appearing bowel, thin brown, gas output in bag. 2 JP drains with serosanguinous fluid.   Incision: midline vertical incision clean, dry, intact closed with suture, with no surrounding erythema or induration  GU: Foley removed      Intake/Output Summary (Last 24 hours) at 02/20/2022 0631  Last data filed at 02/20/2022 0400  Gross per 24 hour   Intake 74.7 ml   Output 2875 ml   Net -2800.3 ml         Medications (scheduled)    famotidine (PEPCID) IV  20 mg Intravenous BID    heparin (porcine) for subcutaneous use  5,000 Units Subcutaneous Q8H SCH    insulin regular  0-20 Units Subcutaneous Q6H SCH    scopolamine  1 patch Topical Q72H       Medications (prn)  acetaminophen, dextrose in water, glucagon, HYDROmorphone **OR** HYDROmorphone, hydrOXYzine, naloxone, ondansetron, phenol, prochlorperazine (COMPAZINE) 5 mg in sodium chloride (NS) 0.9 % 25 mL IVPB    Labs:  Lab Results   Component Value Date    WBC 23.2 (H) 02/19/2022    HGB 7.2 (L) 02/19/2022    HCT 22.1 (L) 02/19/2022  PLT 531 (H) 02/19/2022       Lab Results   Component Value Date    NA 139 02/19/2022    K 3.4 02/19/2022    CL 106 02/19/2022    CO2 29.0 02/19/2022    BUN <5 (L) 02/19/2022    CREATININE 0.38 (L) 02/19/2022    GLU 90 02/19/2022    CALCIUM 7.5 (L) 02/19/2022    MG 1.5 (L) 02/18/2022    PHOS 2.8 02/18/2022       Lab Results   Component Value Date    BILITOT 0.4 02/19/2022    BILIDIR 0.30 01/10/2022    PROT 4.8 (L) 02/19/2022    ALBUMIN 2.1 (L) 02/19/2022    ALT 13 02/19/2022    AST 15 02/19/2022    ALKPHOS 122 (H) 02/19/2022       Lab Results   Component Value Date    INR 1.42 01/09/2022    APTT 33.4 01/09/2022        Scribe's Attestation: Juliene Pina, MD obtained and performed the history, physical exam and medical decision making elements that were entered into the chart. Documentation assistance was provided by me personally, a scribe. Signed by Marcy Siren Scribe, on February 20, 2022 at 6:33 AM.     ----------------------------------------------------------------------------------------------------------------------  February 20, 2022 8:33 AM. Documentation assistance provided by the Scribe. I was present during the time the encounter was recorded. The information recorded by the Scribe was done at my direction and has been reviewed and validated by me.  ----------------------------------------------------------------------------------------------------------------------    Juliene Pina, MD  OBGYN Resident, PGY-2  [facility]

## 2022-02-20 NOTE — Unmapped (Signed)
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 She underwent an exploratory laparotomy, low anterior resection with diverting loop ileostomy, TAH and BSO, splenectomy, cholecystectomy, and appendectomy and received cisplatin intraoperatively.     CWON follow up ostomy teaching visit with pt.  Her friend was present throughout the teaching session, and her husband attended the last 1/3 of the visit.  Pt states she had a BM per rectum this morning.  Pt demonstrated emptying her appliance in the bathroom again today; and she demonstrated burping her appliance ... All independently.     Pt's surgeon has advised me via secure chat that ostomy nurse may remove retention rod at next pouch change.  Pt's pouch applied yesterday is intact without leaking.  As pt is expected to leave this coming midweek, we elected to see if we can get 3 days wear time before changing appliance again.  Full teaching review done today; pt had very good recall and asked appropriate questions.      We will change pouch 02/22/22.    Ileostomy Teaching:  Educated patient on the following subjects:  -      Anatomical changes from surgery  -      Assessing ostomy and expected changes over 6-8 weeks  -      Skin care  -      Emptying appliance  -      Risks of dehydration; s/sx dehydration; proper response to dehydration  -      Risk of blockages; identifying blockages and proper response to address  -      Foods that affect ileostomy management: odor; thickeners/thinners; gas formation  -      burping appliance  -      Showering, swimming, ADLs  -      Changing appliance, including  -      Removing wafer  -      Cleaning skin  -      Crusting impaired skin (if needed) using stomahesive powder and skin prep   -      Measuring and cutting ostomy wafer  -      Applying barrier ring or using stomahesive paste  -      Applying pouch  -      Attaching new pouch (if 2 piece)  -      Mucous plug   -      Contacting vendors for sample supplies and assistance with pouching difficulties after discharge       Had patient demonstrate the following: with min-mod verbal coaching and positive return demonstration.  -      Emptying pouch  -      Burping appliance      Stoma Type:  -  Ileostomy Stoma Location:  - RLQ (Right Lower Quadrant)     Stoma Characteristics:  - Round  - Budded Stoma Mucosal Condition and Color:  - Moist  - Edematous  - Red     Mucocutaneous Junction:  - Not able to assess at this time, pouch not removed    Rod/Stents:  - No     Output:  -  Debra Ponce  - Liquid  - Stool     Peristomal Skin Condition:   - Not able to assess at this time, pouch not removed    Abdominal Contours:  - Soft    Pouching System:  - 2 Piece  - Convex  - CTF (Cut to fit)  - Moldable barrier ring Anticipated Wear Time of Pouching System:  - To be determined     Teaching Limitations/Considerations:   - Anxiety  - Drowsy    Teaching/Instructions:  - Discussed agreed upon goals that the patient/family member would be able to empty and change ostomy pouch, have the instructions needed to order home supplies and know how to trouble shoot pouch leakage and common peristomal skin problems.    Ostomy Home Starter Kit - Verbal Consent Obtained:  - Not asked    Recommendations/Plan:   - Patient will need more ostomy teaching prior to discharge, WOC nurse will continue to follow.  - WOC nurse will follow up in 1 days for pouch evaluation.  - Unit to order discharge ostomy supplies.  - If pouch leaks, Change with bedside supplies.  - Pending discharge ostomy supply list.    Ostomy Discharge Goals:  - Patient/family member able to empty pouch independently.  - Patient/family member able to change pouch independently.  - Patient/family member able to identify ostomy supplies to take home at discharge and knowledge of supply order process.     Recommended Consults:   - Not Applicable Plan of Care Discussed With:  - Patient  - Family  - RN [name]     Ostomy Supplies:   - Supplies available on unit.  - Unit to order.  - Patient specific supplies at bedside.    Ostomy Product List:  Inpatient supply list: take home quantity listed     Pouch System:  [employer] 2-Piece Soft Convex Wafer Red (mfg [id number]) - [employer]son number [phone]) order 5  [employer] 2-Piece Fecal Pouch Red (mfg 18003) - lawson number [phone]) order 5    Accessories:   [employer] (mfg [id number])- [employer]son number [phone]) order 5  [employer] ConvaTec Adhesive Remover Wipes (mfg [id number])- lawson number [phone]) order 1 box  Hollister Ostomy Belt 23-43 (mfg 7300) - lawson number [phone]) order 1  36M No-Sting Barrier Film- Spray (mfg 3346) - lawson number ([id number]) order 1     WOCN Follow-up:  - Follow up on 1/9      Workup Time:  60 minutes      Assessment, Education, Supplies, Direct care, Charting      ReConsult or call for questions, new concerns or wound deterioration  Debra Ponce, [address]  [phone]

## 2022-02-20 NOTE — Unmapped (Signed)
Pt alert and oriented*4. Vital signs stable. Pain controlled with epidural. No complained of nausea and vomiting. Surgical site CDI. Both J.P. drains intact. Tolerated clear liquid diet, IVF infusing. Ostomy teaching done by ostomy nurse. Voiding adequate. Safety measure in place. Will continue to monitor.   Problem: Latex Allergy  Goal: Absence of Allergy Symptoms  Outcome: Progressing     Problem: Wound  Goal: Optimal Coping  Outcome: Progressing  Goal: Optimal Functional Ability  Outcome: Progressing  Goal: Absence of Infection Signs and Symptoms  Outcome: Progressing  Goal: Improved Oral Intake  Outcome: Progressing  Goal: Optimal Pain Control and Function  Outcome: Progressing  Goal: Skin Health and Integrity  Outcome: Progressing  Goal: Optimal Wound Healing  Outcome: Progressing     Problem: Adult Inpatient Plan of Care  Goal: Plan of Care Review  Outcome: Progressing  Goal: Patient-Specific Goal (Individualized)  Outcome: Progressing  Goal: Absence of Hospital-Acquired Illness or Injury  Outcome: Progressing  Intervention: Prevent and Manage VTE (Venous Thromboembolism) Risk  Recent Flowsheet Documentation  Taken 02/20/2022 0915 by Donzetta Starch, RN  VTE Prevention/Management:   anticoagulant therapy   ambulation promoted   bleeding precautions maintained  Anti-Embolism Device Type: SCD, Knee  Anti-Embolism Intervention: On  Anti-Embolism Device Location: BLE  Goal: Optimal Comfort and Wellbeing  Outcome: Progressing  Goal: Readiness for Transition of Care  Outcome: Progressing  Goal: Rounds/Family Conference  Outcome: Progressing     Problem: Self-Care Deficit  Goal: Improved Ability to Complete Activities of Daily Living  Outcome: Progressing     Problem: Fall Injury Risk  Goal: Absence of Fall and Fall-Related Injury  Outcome: Progressing     Problem: Skin Injury Risk Increased  Goal: Skin Health and Integrity  Outcome: Progressing     Problem: Malnutrition  Goal: Improved Nutritional Intake  Outcome: Progressing

## 2022-02-20 NOTE — Unmapped (Signed)
Surgery Progress Note    Hospital Day: 6    Assessment:     Debra Ponce is a 52 y.o. 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 She underwent an exploratory laparotomy, low anterior resection with diverting loop ileostomy, TAH and BSO, splenectomy, cholecystectomy, and appendectomy and received IP cisplatin intraoperatively.    Interval Events:     NAEON. No nausea or vomiting. Gas and stool from ileostomy. She reports urinating often. She reports having a small amount of liquid stool output from her rectum.     Plan:   Postoperative pain management:  - Epidural in place, APS is managing, plan for removal on 02/21/22  - Naloxone gtt for itching.     CV: VSS    Pulm:   - SORA. Encouraging IS and ambulation.     Fluids: 35ml/hr D5 1/2 NS  Electrolytes: Hypokalemia (replacing K today)  Nutrition: CLD started today    GI: S/p cholecystectomy, LAR with DLI  - WOCN consulted for new ileostomy, appreciate teaching and recommendations   - will remove retention rod during next pouch change per patient preference  - Start clear liquid diet, watch for nausea and vomiting  - Scopolamine for nausea    Endo:   - Previously labile blood glucose on BMP; however, stable on finger stick and peripheral BMP draw, avoid taking labs from port while patient on fluids  - Discontinue POC glucose checks    Renal/GU:   - Voiding spontaneously  - UOP Adequate    GYN: S/p TAH and BSO and cytoreduction  - No acute post-operative needs  - GYN/ONC following    Heme: 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, now s/p splenectomy  - Leukocytosis down trending  - Acute blood loss anemia with hgb < 7, will T&S and transfuse blood  - Patient will need splenectomy vaccines on POD #14 (1/16) or prior to discharge   - Drain amylase, serum amylase  - POD1: 214, 20  - POD3: 43, 35   - POD5: 24, 36    ID: No acute concerns    Ppx: SQH 5000q8 while epidural in place    Dispo  - Current level of care: Floor  - Patient will need final ostomy teaching and supplies prior to discharge   - Unlikely to discharge with drains in place    Objective:      Vital Signs:  BP 133/77  - Pulse 87  - Temp 37.3 ??C (99.1 ??F) (Oral)  - Resp 16  - Ht 149.9 cm (4' 11)  - Wt 56 kg (123 lb 7.3 oz)  - LMP 11/14/2020 (Approximate)  - SpO2 100%  - BMI 24.94 kg/m??     Input/Output:  I/O         01/05 0701  01/06 0700 01/06 0701  01/07 0700 01/07 0701  01/08 0700    P.O. 0 0     I.V. (mL/kg) 76.3 (1.4) 74.7 (1.3)     IV Piggyback       Total Intake(mL/kg) 76.3 (1.4) 74.7 (1.3)     Urine (mL/kg/hr) 2450 (1.8) 2600 (1.9) 0 (0)    Emesis/NG output 250 0     Drains 280 75 0    Stool 25 200 100    Total Output 3005 2875 100    Net -2928.8 -2800.3 -100           Urine Occurrence 0 x 1 x 1 x  Stool Occurrence 0 x 0 x             Physical Exam:    General: Cooperative, no distress, well appearing  HEENT: Normocephalic, atraumatic  Pulmonary: Normal work of breathing, equal bilateral chest rise  Cardiovascular: Regular tachycardia  Abdomen: Soft, appropriately tender, mildly distended. Incisions c/d/i with dermabond in place. Drains with s/s output. Ileostomy is pink, stool and gas in the bag.  Extremities: Minimal peripheral edema    Labs:    Lab Results   Component Value Date    WBC 19.2 (H) 02/20/2022    HGB 6.8 (L) 02/20/2022    HCT 20.7 (L) 02/20/2022    PLT 532 (H) 02/20/2022       Lab Results   Component Value Date    NA 139 02/20/2022    K 3.3 (L) 02/20/2022    CL 108 (H) 02/20/2022    CO2 25.0 02/20/2022    BUN <5 (L) 02/20/2022    CREATININE 0.40 (L) 02/20/2022    CALCIUM 7.1 (L) 02/20/2022    MG 1.5 (L) 02/18/2022    PHOS 2.8 02/18/2022       Microbiology Results (last day)       Procedure Component Value Date/Time Date/Time    Amylase, body fluid [[id number]] Collected: 02/16/22 0350    Lab Status: Final result Specimen: Fluid, Peritoneal Updated: 02/16/22 0451     Amylase, Fluid 214 U/L      Comment: Peritional fluid amylase >3-5X serum amylase suggests pancreatic ascities.        Amylase, Fluid Type Fluid, Peritoneal    Narrative:      The reference range for this body fluid has not been established. The test result must be integrated into the clinical context for interpretation.  This test was developed and its performance characteristics determined by the [facility] of the [employer], [employer]. This test has not been cleared or approved by the FDA. The laboratory is regulated under CAP and CLIA as qualified to perform high-complexity testing. This test is to be used for clinical purposes and should not be regarded as investigational or for research.            Imaging:   No results found.    Initial note by: Campbell Riches, MS3    Purcell Nails, MD  PGY1 General Surgery Resident  Please page the intern/floor/service pager for questions

## 2022-02-21 ENCOUNTER — Telehealth: Payer: Self-pay | Admitting: *Deleted

## 2022-02-21 LAB — CBC
HEMATOCRIT: 22 % — ABNORMAL LOW (ref 34.0–44.0)
HEMOGLOBIN: 7.3 g/dL — ABNORMAL LOW (ref 11.3–14.9)
MEAN CORPUSCULAR HEMOGLOBIN CONC: 33.1 g/dL (ref 32.0–36.0)
MEAN CORPUSCULAR HEMOGLOBIN: 27.9 pg (ref 25.9–32.4)
MEAN CORPUSCULAR VOLUME: 84.3 fL (ref 77.6–95.7)
MEAN PLATELET VOLUME: 6.8 fL (ref 6.8–10.7)
PLATELET COUNT: 537 10*9/L — ABNORMAL HIGH (ref 150–450)
RED BLOOD CELL COUNT: 2.61 10*12/L — ABNORMAL LOW (ref 3.95–5.13)
RED CELL DISTRIBUTION WIDTH: 18 % — ABNORMAL HIGH (ref 12.2–15.2)
WBC ADJUSTED: 17 10*9/L — ABNORMAL HIGH (ref 3.6–11.2)

## 2022-02-21 LAB — BASIC METABOLIC PANEL
ANION GAP: 7 mmol/L (ref 5–14)
BLOOD UREA NITROGEN: <5 mg/dL — ABNORMAL LOW (ref 9–23)
CALCIUM: 7.7 mg/dL — ABNORMAL LOW (ref 8.7–10.4)
CHLORIDE: 111 mmol/L — ABNORMAL HIGH (ref 98–107)
CO2: 24 mmol/L (ref 20.0–31.0)
CREATININE: 0.39 mg/dL — ABNORMAL LOW
EGFR CKD-EPI (2021) FEMALE: >90 mL/min/{1.73_m2} (ref >=60)
GLUCOSE RANDOM: 82 mg/dL (ref 70–179)
POTASSIUM: 3.5 mmol/L (ref 3.4–4.8)
SODIUM: 142 mmol/L (ref 135–145)

## 2022-02-21 LAB — PHOSPHORUS: PHOSPHORUS: 3.1 mg/dL (ref 2.4–5.1)

## 2022-02-21 LAB — AMYLASE: AMYLASE: 33 U/L (ref 30–118)

## 2022-02-21 LAB — MAGNESIUM: MAGNESIUM: 1.3 mg/dL — ABNORMAL LOW (ref 1.6–2.6)

## 2022-02-21 LAB — LIPASE: LIPASE: 29 U/L (ref 12–53)

## 2022-02-21 MED ADMIN — potassium chloride 20 mEq in 100 mL IVPB Premix: 20 meq | INTRAVENOUS | @ 15:00:00 | Stop: 2022-02-21

## 2022-02-21 MED ADMIN — famotidine (PF) (PEPCID) injection 20 mg: 20 mg | INTRAVENOUS | @ 14:00:00

## 2022-02-21 MED ADMIN — potassium chloride 20 mEq in 100 mL IVPB Premix: 20 meq | INTRAVENOUS | @ 17:00:00 | Stop: 2022-02-21

## 2022-02-21 MED ADMIN — bupivacaine (PF) (MARCAINE) 0.15 % in sodium chloride (NS) 250 mL epidural: EPIDURAL | @ 01:00:00

## 2022-02-21 MED ADMIN — acetaminophen (TYLENOL) tablet 650 mg: 650 mg | ORAL | @ 19:00:00

## 2022-02-21 MED ADMIN — lidocaine 4 % patch 1 patch: 1 | TRANSDERMAL | @ 19:00:00

## 2022-02-21 MED ADMIN — acetaminophen (TYLENOL) tablet 650 mg: 650 mg | ORAL | @ 01:00:00

## 2022-02-21 MED ADMIN — oxyCODONE (ROXICODONE) immediate release tablet 5 mg: 5 mg | ORAL | @ 20:00:00 | Stop: 2022-02-21

## 2022-02-21 MED ADMIN — oxyCODONE (ROXICODONE) immediate release tablet 5 mg: 5 mg | ORAL | @ 16:00:00 | Stop: 2022-02-21

## 2022-02-21 MED ADMIN — magnesium sulfate 2gm/50mL IVPB: 2 g | INTRAVENOUS | @ 18:00:00 | Stop: 2022-02-21

## 2022-02-21 MED ADMIN — magnesium sulfate 2gm/50mL IVPB: 2 g | INTRAVENOUS | @ 15:00:00 | Stop: 2022-02-21

## 2022-02-21 MED ADMIN — acetaminophen (TYLENOL) tablet 650 mg: 650 mg | ORAL | @ 14:00:00

## 2022-02-21 MED ADMIN — famotidine (PF) (PEPCID) injection 20 mg: 20 mg | INTRAVENOUS | @ 01:00:00

## 2022-02-21 MED ADMIN — dextrose 5 % and sodium chloride 0.45 % infusion: 75 mL/h | INTRAVENOUS | @ 02:00:00

## 2022-02-21 MED ADMIN — hydrOXYzine (ATARAX) tablet 25 mg: 25 mg | ORAL | @ 01:00:00

## 2022-02-21 MED ADMIN — enoxaparin (LOVENOX) syringe 40 mg: 40 mg | SUBCUTANEOUS | @ 19:00:00

## 2022-02-21 NOTE — Unmapped (Signed)
Department of Anesthesiology  Pain Division  Acute Pain Pager [phone]        Acute Pain Service Follow-Up Note- Epidural      Assessment and Plan  Patient is a 52 y.o. s/p cytoreductive surgery and HIPEC who is now POD#  6  previously with an epidural catheter for postoperative pain.    Interval updates:   Pain well controlled with epidural and patient expresses she is sad to have it removed. Will plan to start oxycodone today at 5 mg and will adjust as necessary. Patient states she does not like how gabapentin makes her feel. Will trial current pain regimen and add Lyrica tonight if requested.      Catheter status/changes: Patient's epidural pulled at 0743 (after reviewing coagulation status), tip intact.  If resuming anticoagulation, please follow these guidelines: 5000unit SQ heparin can begin immediately after epidural removed, 7500unit SQ heparin can begin immediately after epidural removed, Prophylactic dose Lovenox can begin 4 hours after epidural removed, Please contact us should therapeutic dose anticoagulation is set to begin or another blood thinner.    Blood thinner: heparin 5000u SQ  Additional pain/sedation medications: Catheter pulled, transition to primary service.   Current diet:   Active Orders   Diet    Nutrition Therapy Clear Liquid       Recommendations:  - Epidural removed on rounds and orders stopped this morning.   - Continue scheduled IV tylenol  - Start oxycodone 5 mg PRN  - Change IV dilaudid to PRN breakthrough pain  - Will assess pain regimen on rounds this afternoon and inquire about addition of Lyrica    We will continue to follow.  Contact the Acute Pain Service first at [phone] with questions or concerns 24 hours a day.  If unable to reach, please contact on call Anesthesiology resident at [phone].     If applicable, naloxone available for sedation or respiratory depression.  If applicable, nalbuphine available for refractory pruritis.  Call APS for additional concerns.  No anesthesia/epidural need for foley catheter.  Contact APS before starting any anticoagulant other than [place] heparin.    CPT  daily management of epidural 95638   Diagnosis: abdominal pain      History  Patient is a 52 y.o. s/p cytoreductive surgery and HIPEC on 02/15/22 by Dr. Pricilla Holm and Vanessa Ralphs.    Daily Hospital Course  There were no Acute Events Overnight.   The patient is obtaining adequate pain relief on current medication regimen and feels that their pain is Well controlled    Epidural Catheter: Currently running  Level: T8/9  LOR:   4.5 cm  Catheter at skin:  9.5 cm  Catheter Start Date: 02/15/22  Postop/Catheter Day: 5    Analgesia Evaluation:  Pain at rest: 0/10  Pain with activity: 3/10    Infusion: 0.15% bupivacaine  Continuous Infusion Rate: 54ml/hour  PCEA Settings: 2ml every 30  minutes    Additional opioid and non-opioid analgesics:   IV acetaminophen 650 mg q6h  PO oxycodone 5 mg PRN  IV hydromorphone 0.25 mg PRN    Complications:  Pruritis: no  Urinary Retention: no  Nausea: No  Vomiting: No  Sedation: No  Respiratory Depression: No    Physical Exam  Temp:  [36.5 ??C (97.7 ??F)-36.9 ??C (98.4 ??F)] 36.5 ??C (97.7 ??F)  Heart Rate:  [82-94] 93  Resp:  [16-18] 18  BP: (118-128)/(81-90) 128/89  MAP (mmHg):  [92-100] 100  SpO2:  [92 %-100 %] 98 %  GEN: Patient sitting up in bed, in no apparent distress.  NG is not present.  CHEST: Unlabored breathing, supplemental oxygen is not present.  Chest tube is not present.  SKIN: epidural Catheter pulled, site unremarkable, no erythema at insertion site, no fluctuance or tenderness noted  GENITOURINARY: foley catheter is not present  NEUROLOGIC: patient moves all extremities, 5/5 strength in lower extremities, alert and oriented to person, place and time; other deficits noted: None    Lab Results   Component Value Date    WBC 17.0 (H) 02/21/2022    RBC 2.61 (L) 02/21/2022    HGB 7.3 (L) 02/21/2022    HCT 22.0 (L) 02/21/2022    MCV 84.3 02/21/2022    MCH 27.9 02/21/2022 MCHC 33.1 02/21/2022    RDW 18.0 (H) 02/21/2022    PLT 537 (H) 02/21/2022    MPV 6.8 02/21/2022     Lab Results   Component Value Date    PT 15.7 (H) 01/09/2022    INR 1.42 01/09/2022     Lab Results   Component Value Date    CREATININE 0.39 (L) 02/21/2022

## 2022-02-21 NOTE — Unmapped (Signed)
AOX4. VSS. Surgical site intact. JPx2 in and ostomy place. Outputs recorded. Pain controlled with continuous epidural. No falls, bed low and locked. Call light and bedside table within reach. Patient refused Gabapentin. No other issue noted. Will continue to monitor.    Problem: Latex Allergy  Goal: Absence of Allergy Symptoms  Outcome: Progressing     Problem: Wound  Goal: Optimal Coping  Outcome: Progressing  Goal: Optimal Functional Ability  Outcome: Progressing  Goal: Absence of Infection Signs and Symptoms  Outcome: Progressing  Goal: Improved Oral Intake  Outcome: Progressing  Goal: Optimal Pain Control and Function  Outcome: Progressing  Goal: Skin Health and Integrity  Outcome: Progressing  Intervention: Optimize Skin Protection  Recent Flowsheet Documentation  Taken 02/21/2022 0418 by Jobe Igo, RN  Skin Protection: protective footwear used  Goal: Optimal Wound Healing  Outcome: Progressing     Problem: Adult Inpatient Plan of Care  Goal: Plan of Care Review  Outcome: Progressing  Goal: Patient-Specific Goal (Individualized)  Outcome: Progressing  Goal: Absence of Hospital-Acquired Illness or Injury  Outcome: Progressing  Intervention: Identify and Manage Fall Risk  Flowsheets (Taken 02/21/2022 0418)  Safety Interventions:   low bed   fall reduction program maintained   lighting adjusted for tasks/safety   nonskid shoes/slippers when out of bed  Intervention: Prevent Skin Injury  Flowsheets (Taken 02/21/2022 0418)  Skin Protection: protective footwear used  Intervention: Prevent and Manage VTE (Venous Thromboembolism) Risk  Flowsheets (Taken 02/21/2022 0418)  VTE Prevention/Management: ambulation promoted  Intervention: Prevent Infection  Flowsheets (Taken 02/21/2022 0418)  Infection Prevention: rest/sleep promoted  Goal: Optimal Comfort and Wellbeing  Outcome: Progressing  Goal: Readiness for Transition of Care  Outcome: Progressing  Goal: Rounds/Family Conference  Outcome: Progressing     Problem: Self-Care Deficit  Goal: Improved Ability to Complete Activities of Daily Living  Outcome: Progressing     Problem: Fall Injury Risk  Goal: Absence of Fall and Fall-Related Injury  Outcome: Progressing  Intervention: Promote Injury-Free Environment  Recent Flowsheet Documentation  Taken 02/21/2022 0418 by Jobe Igo, RN  Safety Interventions:   low bed   fall reduction program maintained   lighting adjusted for tasks/safety   nonskid shoes/slippers when out of bed     Problem: Skin Injury Risk Increased  Goal: Skin Health and Integrity  Outcome: Progressing  Intervention: Optimize Skin Protection  Recent Flowsheet Documentation  Taken 02/21/2022 0418 by Jobe Igo, RN  Skin Protection: protective footwear used     Problem: Malnutrition  Goal: Improved Nutritional Intake  Outcome: Progressing

## 2022-02-21 NOTE — Unmapped (Signed)
Surgery Progress Note    Hospital Day: 7    Assessment:     Debra Ponce is a 52 y.o. 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 She underwent an exploratory laparotomy, low anterior resection with diverting loop ileostomy, TAH and BSO, splenectomy, cholecystectomy, and appendectomy and received IP cisplatin intraoperatively.    Interval Events:     NAEON. No nausea or vomiting. Gas and stool from ileostomy. She reports urinating often. Did not receive the unit of RBCs but her Hgb was 7.3 this morning.  Plan:   Postoperative pain management:  - Epidural in place, APS is managing, plan for removal today 02/21/22  - Naloxone gtt for itching.     CV: VSS    Pulm:   - SORA. Encouraging IS and ambulation.     Fluids: ML  Electrolytes: Hypokalemia (replacing K today)  Nutrition: Advance to regular diet    GI: S/p cholecystectomy, LAR with DLI  - WOCN consulted for new ileostomy, appreciate teaching and recommendations   - will remove retention rod during next pouch change per patient preference  - Start clear liquid diet, watch for nausea and vomiting  - Scopolamine for nausea    Endo:   - Previously labile blood glucose on BMP; however, stable on finger stick and peripheral BMP draw, avoid taking labs from port while patient on fluids  - Discontinue POC glucose checks    Renal/GU:   - Voiding spontaneously  - UOP Adequate    GYN: S/p TAH and BSO and cytoreduction  - No acute post-operative needs  - GYN/ONC following    Heme: 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, now s/p splenectomy  - Leukocytosis down trending  - Acute blood loss anemia with hgb < 7, will T&S and transfuse blood  - Patient will need splenectomy vaccines on POD #14 (1/16) or prior to discharge   - Drain amylase, serum amylase  - POD1: 214, 20  - POD3: 43, 35   - POD5: 24, 36    ID: No acute concerns    Ppx: SQH 5000q8 while epidural in place    Dispo  - Current level of care: Floor  - Patient will need final ostomy teaching and supplies prior to discharge   - Unlikely to discharge with drains in place    Objective:      Vital Signs:  BP 118/81  - Pulse 82  - Temp 36.6 ??C (97.9 ??F) (Oral)  - Resp 16  - Ht 149.9 cm (4' 11)  - Wt 56 kg (123 lb 7.3 oz)  - LMP 11/14/2020 (Approximate)  - SpO2 96%  - BMI 24.94 kg/m??     Input/Output:  I/O         01/06 0701  01/07 0700 01/07 0701  01/08 0700 01/08 0701  01/09 0700    P.O. 0 240     I.V. (mL/kg) 74.7 (1.3)      IV Piggyback  100     Total Intake 74.7 340     Urine (mL/kg/hr) 2600 (1.9) 4475 (3.3)     Emesis/NG output 0 0     Drains 75 117     Stool 200 450     Total Output(mL/kg) 2875 (51.3) 5042 (90)     Net -2800.3 -4702            Urine Occurrence 1 x 1 x     Stool Occurrence 0 x  Emesis Occurrence  0 x             Physical Exam:    General: Cooperative, no distress, well appearing  HEENT: Normocephalic, atraumatic  Pulmonary: Normal work of breathing, equal bilateral chest rise  Cardiovascular: Regular tachycardia  Abdomen: Soft, appropriately tender, mildly distended. Incisions c/d/i with dermabond in place. Drains with s/s output. Ileostomy is pink, stool and gas in the bag.  Extremities: Minimal peripheral edema    Labs:    Lab Results   Component Value Date    WBC 17.0 (H) 02/21/2022    HGB 7.3 (L) 02/21/2022    HCT 22.0 (L) 02/21/2022    PLT 537 (H) 02/21/2022       Lab Results   Component Value Date    NA 139 02/20/2022    K 3.3 (L) 02/20/2022    CL 108 (H) 02/20/2022    CO2 25.0 02/20/2022    BUN <5 (L) 02/20/2022    CREATININE 0.40 (L) 02/20/2022    CALCIUM 7.1 (L) 02/20/2022    MG 1.5 (L) 02/18/2022    PHOS 2.8 02/18/2022       Microbiology Results (last day)       Procedure Component Value Date/Time Date/Time    Amylase, body fluid [[id number]] Collected: 02/16/22 0350    Lab Status: Final result Specimen: Fluid, Peritoneal Updated: 02/16/22 0451     Amylase, Fluid 214 U/L      Comment: Peritional fluid amylase >3-5X serum amylase suggests pancreatic ascities.        Amylase, Fluid Type Fluid, Peritoneal    Narrative:      The reference range for this body fluid has not been established. The test result must be integrated into the clinical context for interpretation.  This test was developed and its performance characteristics determined by the [facility] of the [employer] [employer]. This test has not been cleared or approved by the FDA. The laboratory is regulated under CAP and CLIA as qualified to perform high-complexity testing. This test is to be used for clinical purposes and should not be regarded as investigational or for research.            Imaging:   No results found.

## 2022-02-21 NOTE — Unmapped (Signed)
GYN ONC PROGRESS NOTE    ASSESSMENT AND PLAN     Hospital Day: 7    Debra Ponce is a 52 y.o. who is POD#6 s/p EUA, exploratory laparotomy, TAH w/ radical dissection, b/l ureterolysis, BSO, posterior cul-de-sac peritonectomy, tumor debulking (Gyn Onc); diagnostic laparoscopy, LAR w primary end-to-end anastomosis, enterolysis, cholecystectomy, tumor capsule tumor debulking, splenectomy, appendectomy, omentectomy, diverting loop ileostomy formation with HIPEC (Surg Onc) on 02/15/21 for Stage IIIC mixed germ cell tumor. Patient is surgical oncology primary.     ONC: Stage IIIC mixed germ cell tumor  - Primary Oncologist: Dr. Pricilla Holm  - Last treatment: Bleomycin, Etoposide, Cisplatin on 11/28 (s/p 2 cycles)  - Last CA-125: 469  - S/p above procedure: final path pending  - A follow up will be requested with Dr. Pricilla Holm in [place], [place]    Postoperative Care:  - H/H: 8.1 > EBL 1000 mL > 3u pRBCs intra-op >> 1u pRBCs ordered per patient with plans for transfusion today. Recommend goal Hgb > 8 given anastomoses and plans for future treatment   - ERAS protocol for pain: sch tylenol, PRN Dilaudid; epidural. Epidural disposition per primary team  - On RA  - Medlocked  - NGT (1/2-1/5). Tolerated CLD yesterday. Diet per primary team.   - Foley removed, urinating without difficulty   - S/p splenectomy. Will need post splenectomy vaccines prior to discharge   - WOCN following for ostomy teaching    Chronic Medical Conditions  - GAD: Home prozac 10 mg, ativan PRN held  - Sleep disturbance: recommend starting atarax nightly     PPX: SCDs, heparin, IS, ambulation    Dispo: ICU    Plan discussed with Dr. Jonny Ruiz and Attending Dr. Reyne Dumas.     SUBJECTIVE     NAEON. Patient feels well this morning. She reports her pain is moderately controlled with her current regimen. She tolerated a CLD without difficulty yesterday and desires to have a diet today. She is ambulating without difficulty and voiding spontaneously.     OBJECTIVE     Temp:  [36.5 ??C (97.7 ??F)-37.3 ??C (99.1 ??F)] 36.6 ??C (97.9 ??F)  Heart Rate:  [82-94] 82  Resp:  [16] 16  BP: (118-133)/(77-90) 118/81  SpO2:  [92 %-100 %] 96 %    Gen: NAD, awake, alert, oriented  CV: regular rate and rhythm   Pulm: clear to auscultation bilaterally, normal work of breathing  Abd: Soft, appropriately tender to palpation, non-distended, bowel sounds present; Ostomy with healthy appearing stoma, thin brown, gas output in bag. 2 JP drains with serosanguinous fluid.   Incision: midline vertical incision clean, dry, intact closed with suture, with no surrounding erythema or induration  GU: Deferred      Intake/Output Summary (Last 24 hours) at 02/21/2022 0753  Last data filed at 02/21/2022 0442  Gross per 24 hour   Intake 340 ml   Output 5042 ml   Net -4702 ml       Medications (scheduled)    acetaminophen  650 mg Oral Q6H    famotidine (PEPCID) IV  20 mg Intravenous BID    scopolamine  1 patch Topical Q72H       Medications (prn)  dextrose in water, glucagon, HYDROmorphone **OR** HYDROmorphone, hydrOXYzine, naloxone, ondansetron, oxyCODONE, phenol, prochlorperazine (COMPAZINE) 5 mg in sodium chloride (NS) 0.9 % 25 mL IVPB    Labs:  Lab Results   Component Value Date    WBC 17.0 (H) 02/21/2022    HGB 7.3 (L) 02/21/2022  HCT 22.0 (L) 02/21/2022    PLT 537 (H) 02/21/2022       Lab Results   Component Value Date    NA 139 02/20/2022    K 3.3 (L) 02/20/2022    CL 108 (H) 02/20/2022    CO2 25.0 02/20/2022    BUN <5 (L) 02/20/2022    CREATININE 0.40 (L) 02/20/2022    GLU 262 (H) 02/20/2022    CALCIUM 7.1 (L) 02/20/2022    MG 1.5 (L) 02/18/2022    PHOS 2.8 02/18/2022       Lab Results   Component Value Date    BILITOT 0.3 02/20/2022    BILIDIR 0.30 01/10/2022    PROT 4.4 (L) 02/20/2022    ALBUMIN 1.8 (L) 02/20/2022    ALT 13 02/20/2022    AST 17 02/20/2022    ALKPHOS 149 (H) 02/20/2022       Lab Results   Component Value Date    INR 1.42 01/09/2022    APTT 33.4 01/09/2022        Scribe's Attestation: Yancey Flemings, MD obtained and performed the history, physical exam and medical decision making elements that were  entered into the chart. Documentation assistance was provided by me personally, a scribe. Signed by Marcy Siren Scribe, on February 21, 2022 at 5:56 AM.     ----------------------------------------------------------------------------------------------------------------------  February 21, 2022 7:53 AM. Documentation assistance provided by the Scribe. I was present during the time the encounter was recorded. The information recorded by the Scribe was done at my direction and has been reviewed and validated by me.  ----------------------------------------------------------------------------------------------------------------------

## 2022-02-21 NOTE — Unmapped (Unsigned)
GYNECOLOGIC ONCOLOGY MULTI-DISCIPLINARY DISPOSITION CONFERENCE NOTE    Date of Conference: 02/23/2022    Patient Name: Debra Ponce  Medical Record Number: [id number]  Primary Oncologist: Dr. Pricilla Holm    History of Present Illness:  Debra Ponce is a 52 y.o. 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     Ca125 was elevated to 285 and AFP was notably 42699, HCG was 116. She started Neoadjuvant chemo on 11/6 with bleomycin, cisplatin and etoposide with plans for weekly visits and at least 3 cycles. She had a repeat CT that revealed significant increase in pelvic masses and right hepatic lesions. After first cycle, improvement of HcG to 13.7 and AFP to 8,456. She received second cycle of BEP on 11/28.    Past Medical History:  No past medical history on file.    Past Surgical History:  Past Surgical History:   Procedure Laterality Date    CHG HYPERTHERMIA EXTERN RX DEEP N/A 02/15/2022    Procedure: HYPERTHERMIA, EXTERNALLY GENERATED; DEEP;  Surgeon: Marion Downer, MD;  Location: MAIN OR [facility];  Service: Surgical Oncology    EXPLORATORY LAPAROTOMY      PR APPENDECTOMY N/A 02/15/2022    Procedure: APPENDECTOMY;  Surgeon: Marion Downer, MD;  Location: MAIN OR [facility];  Service: Surgical Oncology    PR CHEMOTX ADMN PERTL CAVITY IMPLANTED PORT/CATH N/A 02/15/2022    Procedure: CHEMOTHERAPY ADMINISTRATION INTO THE PERITONEAL CAVITY VIA INDWELLING PORT OR CATHETER;  Surgeon: Marion Downer, MD;  Location: MAIN OR [facility];  Service: Surgical Oncology    PR COLOSTOMY N/A 02/15/2022    Procedure: COLOSTOMY OR SKIN LEVEL CECOSTOMY;  Surgeon: Marion Downer, MD;  Location: MAIN OR [facility];  Service: Surgical Oncology    PR EXCISION/DESTRUCTION OPEN ABDOMINAL TUMORS 5 CM N/A 02/15/2022    Procedure: EXCISION/DESTRUCTION, OPEN, INTRA-ABD TUMOR/CYST/ENDOMETRIOMA, 1+ PERITONEAL/MESENTERI/RETROPERIT 5CM OR <;  Surgeon: Marion Downer, MD;  Location: MAIN OR [facility];  Service: Surgical Oncology    PR EXPLORATORY OF ABDOMEN N/A 02/15/2022    Procedure: EXPLORATORY LAPAROTOMY, EXPLORATORY CELIOTOMY WITH OR WITHOUT BIOPSY(S);  Surgeon: Marion Downer, MD;  Location: MAIN OR [facility];  Service: Surgical Oncology    PR LAP,DIAGNOSTIC ABDOMEN N/A 02/15/2022    Procedure: LAPAROSCOPY, ABDOMEN, PERITONEUM, & OMENTUM, DIAGNOSTIC, W/WO COLLECTION SPECIMEN(S) BY BRUSHING OR WASHING;  Surgeon: Marion Downer, MD;  Location: MAIN OR [facility];  Service: Surgical Oncology    PR OOPH [facility] DISSECT FOR DEBULKING N/A 02/15/2022    Procedure: RESECTION (INITIAL) OVARIAN, TUBAL/PRIM PERITONEAL MALIG W/BIL S&O/OMENTECT; W/RAD DISSECTION FOR DEBULKING;  Surgeon: Carver Fila, MD;  Location: MAIN OR [facility];  Service: Gynecology Oncology    PR PART REMOVAL COLON W ANASTOMOSIS N/A 02/15/2022    Procedure: COLECTOMY, PARTIAL; WITH ANASTOMOSIS;  Surgeon: Marion Downer, MD;  Location: MAIN OR [facility];  Service: Surgical Oncology    PR REMOVAL GALLBLADDER N/A 02/15/2022    Procedure: CHOLECYSTECTOMY;  Surgeon: Marion Downer, MD;  Location: MAIN OR [facility];  Service: Surgical Oncology    PR REMOVAL OF OMENTUM N/A 02/15/2022    Procedure: OMENTECTOMY, EPIPLOECTOMY, RESECTION OF OMENTUM;  Surgeon: Marion Downer, MD;  Location: MAIN OR [facility];  Service: Surgical Oncology    PR REMOVAL SPLEEN, TOTAL N/A 02/15/2022    Procedure: SPLENECTOMY; TOT (SEPART PROC);  Surgeon: Marion Downer, MD;  Location: MAIN OR [facility];  Service: Surgical Oncology    TONSILLECTOMY  [phone]  TUBAL LIGATION Bilateral        Family History:  Family History   Problem Relation Age of Onset    Breast cancer Mother     Stroke Mother     Heart attack Mother     Diabetes Mother     Heart attack Father     Atrial fibrillation Father     Diabetes Father     Lymphoma Paternal Uncle     Heart attack Maternal Grandmother     Diabetes Paternal Grandmother Heart attack Paternal Grandfather     Diabetes Paternal Grandfather        Physical Exam:  BP 100/63  - Pulse 101  - Temp 36.7 ??C (98.1 ??F) (Temporal)  - Resp 20  - Wt 49.4 kg (109 lb)  - SpO2 97%  - BMI 22.02 kg/m??   General: Alert, oriented, no acute distress.  HEENT: Normocephalic, atraumatic. Neck symmetric without masses. Sclera anicteric. Posterior oropharynx clear.  Chest: Normal work of breathing. Clear to auscultation bilaterally.  No wheezing or crackles. Good air movement throughout   Cardiovascular: Slightly tachycardic to 103, regular rhythm, no murmurs.  Abdomen: Distended with known large tumor bur  Extremities: Grossly normal range of motion.  Warm, well perfused.  No edema bilaterally.  Skin: No rashes or lesions noted.  Lymphatics: No cervical, supraclavicular, or inguinal adenopathy.    Preop Labs and Imaging:  Lab Results   Component Value Date    CA125 469 (H) 02/04/2022    AFPTM 8,546 (H) 01/09/2022      12/27 CT A/P  FINDINGS:   Lower Chest: No acute findings.     Hepatobiliary: Partially calcified mass in the right   subdiaphragmatic region indenting the dome of the liver measures   12.0 x 8.5 cm on image 13/2, without significant change since prior   study. A nother low-attenuation mass along the lateral capsular   surface of the right hepatic lobe measures 5.3 x 1.3 cm on image   27/2, also without significant change. Mild perihepatic ascites is   decreased since prior study.     Gallbladder is unremarkable. No evidence of biliary ductal   dilatation.     Pancreas: No mass or inflammatory changes.     Spleen: Within normal limits in size and appearance. Decreased   perisplenic ascites noted.     Adrenals/Urinary Tract: No suspicious masses identified. No evidence   of ureteral calculi or hydronephrosis.     Stomach/Bowel: No evidence of obstruction, inflammatory process or   abnormal fluid collections.     Vascular/Lymphatic: No pathologically enlarged lymph nodes. No acute   vascular findings.     Reproductive: Large soft tissue mass is again seen which nearly   completely fills the pelvis and surrounds the ureter. This mass   extends superiorly into the mid abdomen, and shows no significant   change. This is consistent with primary ovarian carcinoma.     Another heterogeneous mass with scattered calcifications is seen in   the right anterior abdomen which is centered in the omental fat.   This measures 8.3 by 3.5 cm, also without significant change.   Diffuse omental and mesenteric soft tissue stranding is unchanged,   consistent with carcinomatosis. Mild ascites in the abdomen and   pelvis has decreased since prior study.     Other:  None.     Musculoskeletal:  No suspicious bone lesions identified.     IMPRESSION:   No significant change in large soft  tissue mass filling the pelvis   and lower abdomen, consistent with ovarian carcinoma.     No significant change in large masses along the capsular surface of   the liver and right abdominal omentum, consistent with peritoneal   carcinomatosis. Diffuse omental and mesenteric soft tissue stranding   is also unchanged.     Mild abdominal and pelvic ascites has decreased since prior study.     No new or progressive disease identified within the abdomen or   pelvis.     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)     01/11/2022 -  Chemotherapy    IP/OP OVARIAN BEP  BLEOmycin 20 units/m2 IV on day 1, etoposide 75 mg/m2 IV over 45 minutes on days 1-5, CISplatin 20 mg/m2 IV on days 1-5, every 21 days         Procedure on 02/15/22:  Exploratory laparotomy, total abdominal hysterectomy with radical dissection, bilateral ureterolysis, bilateral salpingo-oophorectomy, posterior cul-de-sac peritonectomy, tumor debulking by Dr Pricilla Holm with Gynecology Oncology  Dr Vanessa Ralphs with surgical oncology: Diagnostic laparoscopy, exploratory laparotomy, low anterior resection with primary end-to-end anastomosis, enterolysis, cholecystectomy, tumor capsule tumor debulking, splenectomy, appendectomy, omentectomy, diverting loop ileostomy formation    Operative Findings:  On diagnostic laparoscopy, only able to clearly visualize the left upper quadrant.  Adhesions of bowel in the right upper quadrant limiting survey, minimal ascites noted in the pelvis, scattered tumor plaques visualized on left diaphragm.  Given these findings, the decision was made to convert to exploratory laparotomy.  Multiple loops of small bowel encased in a Canc??n like structure in the midline with multiple filmy adhesions tethering to the superior aspect of the a large pelvic mass.  Multicystic and necrotic pelvic mass originating from left ovary, approximately 18 x 20 cm incorporated within the left broad ligament with the left tube draped overlying the left ovarian mass.  Bilateral tubes normal in appearance, normal-appearing uterus with smooth serosa.  Right adnexa similarly with approximate 10 x 8 multicystic and necrotic mass.  Diffuse tumor plaques encompassing posterior cul-de-sac peritoneum.  Rectosigmoid densely adhered to posterior aspect of left adnexal mass requiring dissection and ultimately resection.  Scattered treated subcentimeter nodules along small bowel mesentery.  5 x 5 cm necrotic and multicystic tumor at the level of the hepatic flexure.  Enlarged similar multicystic necrotic tumor adherent between the right diaphragm and the liver without any evidence of liver invasion and tumor encased solely within the liver capsule.  Multiple tumor nodules along the spleen requiring splenectomy.  Omentum overall fairly normal in appearance with only scattered small tumor deposits, no omental cake was encountered.      Pathology:  Diagnosis   Date Value Ref Range Status   02/15/2022   Final    A: Abdominal fat pad, excision  - Fibroadipose tissue with residual mixed malignant germ splenectomy.  Omentum overall fairly normal in appearance with only scattered small tumor deposits, no omental cake was encountered.      Pathology:  Diagnosis   Date Value Ref Range Status   02/15/2022   Final    A: Abdominal fat pad, excision  - Fibroadipose tissue with residual mixed malignant germ cell tumor with treatment effect    B: Left adnexa, salpingo-oophorectomy  - Residual mixed malignant germ cell tumor with treatment effect, ypT3cNM1 (See Comment and Synoptic report)  - FIGO stage IIIC  -  Fallopian tubes with no tumor present    C: Right adnexa, salpingo-oophorectomy  - Residual mixed malignant germ cell tumor with treatment effect (See Synoptic report)  - Fallopian tubes not identified    D: Left parametrium, biopsy  - Fibroadipose tissue with residual mixed malignant germ cell tumor with treatment effect    E: Uterus and cervix, hysterectomy  - Right adnexal soft tissue with residual mixed malignant germ cell tumor with treatment effect    Myometrium:  - Adenomyosis  - Leiomyomata measuring up to 0.7 cm  - Extensive chronic serositis    Cervix:   - Ectocervix and endocervix with chronic cervicitis     Endometrium:  - Inactive endometrium    F: Low anterior resection  - Residual mixed malignant  germ cell tumor with treatment effect involving colonic serosa and pericolonic adipose tissue  - Proximal and distal colonic resection margins with chronic serositis, but negative for tumor     G: Right peritoneum, biopsy  - Fibroadipose tissue with residual mixed malignant germ cell tumor with treatment effect    H: Right retroperitoneal mass, excision  - Fibroadipose tissue with residual mixed malignant germ cell tumor with treatment effect    I: Omentum, omentectomy  - Fibroadipose tissue with residual mixed malignant germ cell tumor with treatment effect    J: Gallbladder, cholecystectomy  - Gallbladder with mild chronic serositis and fibrous adhesion    K: Right diaphragm, excision  - Fibrous tissue with treatment effect involving splenic capsule    N: Appendix, appendectomy  - Appendix with fibrous obliteration at the tip  - Periappendiceal adipose tissue with acute and chronic inflammation    O: Small bowel mesenteric nodules, excision  - Fibroadipose tissue with residual mixed malignant germ cell tumor with treatment effect    P: Mesenteric nodules, excision  - Fibroadipose tissue with residual mixed malignant germ cell tumor with treatment effect    Q: Hepatic flexure mass, excision  - Fibroadipose tissue with residual mixed malignant germ cell tumor with treatment effect    R: Anvil and donut, biopsy  - Benign segment of colon with acute and chronic inflammation and fibrous adhesion    This electronic signature is attestation that the pathologist personally reviewed the submitted material(s) and the final diagnosis reflects that evaluation.     02/15/2022   Final    A: Abdominal fat pad, excision  - Fibroadipose tissue with residual mixed malignant germ cell tumor with treatment effect    B: Left adnexa, salpingo-oophorectomy  - Residual mixed malignant germ cell tumor with treatment effect, ypT3cNM1 (See Comment and Synoptic report)  - FIGO stage IIIC  - Fallopian tubes with no tumor present    C: Right adnexa, salpingo-oophorectomy  - Residual mixed malignant germ cell tumor with treatment effect (See Synoptic report)  - Fallopian tubes not identified    D: Left parametrium, biopsy  - Fibroadipose tissue with residual mixed malignant germ cell tumor with treatment effect    E: Uterus and cervix, hysterectomy  - Right adnexal soft tissue with residual mixed malignant germ cell tumor with treatment effect    Myometrium:  - Adenomyosis  - Leiomyomata measuring up to 0.7 cm  - Extensive chronic serositis    Cervix:   - Ectocervix and endocervix with chronic cervicitis     Endometrium:  - Inactive endometrium    F: Low anterior resection  - Residual mixed malignant  germ cell tumor with treatment effect involving colonic serosa and pericolonic adipose  tissue  - Proximal and distal colonic resection margins with chronic serositis, but negative for tumor     G: Right peritoneum, biopsy  - Fibroadipose tissue with residual mixed malignant germ cell tumor with treatment effect    H: Right retroperitoneal mass, excision  - Fibroadipose tissue with residual mixed malignant germ cell tumor with treatment effect    I: Omentum, omentectomy  - Fibroadipose tissue with residual mixed malignant germ cell tumor with treatment effect    J: Gallbladder, cholecystectomy  - Gallbladder with mild chronic serositis and fibrous adhesion    K: Right diaphragm, excision  - Fibrous tissue with residual mixed malignant germ cell tumor with treatment effect  - Hepatic tissue with no tumor present    L: Left diaphragm nodule, biopsy  - Fibrous tissue with residual mixed malignant germ cell tumor with treatment effect    M: Spleen, splenectomy  - Residual mixed malignant germ cell tumor with treatment effect involving splenic capsule    N: Appendix, appendectomy  - Appendix with fibrous obliteration at the tip  - Periappendiceal adipose tissue with acute and chronic inflammation    O: Small bowel mesenteric nodules, excision  - Fibroadipose tissue with residual mixed malignant germ cell tumor with treatment effect    P: Mesenteric nodules, excision  - Fibroadipose tissue with residual mixed malignant germ cell tumor with treatment effect    Q: Hepatic flexure mass, excision  - Fibroadipose tissue with residual mixed malignant germ cell tumor with treatment effect    R: Anvil and donut, biopsy  - Benign segment of colon with acute and chronic inflammation and fibrous adhesion    This electronic signature is attestation that the pathologist personally reviewed the submitted material(s) and the final diagnosis reflects that evaluation.       Synoptic Report   Date Value Ref Range Status   02/15/2022   Final    OVARY or FALLOPIAN TUBE or PRIMARY PERITONEUM  OVARY OR FALLOPIAN TUBE OR PRIMARY PERITONEUM - A, B, C, D, E, F, G, H, I, K, L, M, O, P, Q  8th Edition - Protocol posted: 05/05/2021    SPECIMEN     Procedure:    Radical hysterectomy      Procedure:    Omentectomy      Procedure:    Peritoneal tumor debulking      Hysterectomy Type:    Abdominal      Specimen Integrity:           Right Ovary Integrity:    Capsule intact      Specimen Integrity:           Left Ovary Integrity:    Capsule intact     TUMOR     Tumor Site:    Bilateral ovaries      Tumor Size:    Greatest Dimension (Centimeters): 19.5 cm     Histologic Type:    Mixed malignant germ cell tumor:  yolk sac tumor, mature teratoma, mature neural elements, immature neural tissue, and immature cartilage      Ovarian Surface Involvement:    Present, right and left      Other Tissue / Organ Involvement:    Right ovary      Other Tissue / Organ Involvement:    Left ovary      Other Tissue / Organ Involvement:    Right fallopian tube      Other Tissue / Organ Involvement:    Omentum  Other Tissue / Organ Involvement:    Diaphragm and splenic capsule      Largest Extrapelvic Peritoneal Focus:    Macroscopic (greater than 2 cm): Splenic capsule      Peritoneal / Ascitic Fluid Involvement:    Not submitted / unknown     REGIONAL LYMPH NODES     Regional Lymph Node Status:    Not applicable (no regional lymph nodes submitted or found)     DISTANT METASTASIS     Distant Site(s) Involved:    Diaphragm, splenic capsule     pTNM CLASSIFICATION (AJCC 8th Edition)     Reporting of pT, pN, and (when applicable) pM categories is based on information available to the pathologist at the time the report is issued. As per the AJCC (Chapter 1, 8th Ed.) it is the managing physician???s responsibility to establish the final pathologic stage based upon all pertinent information, including but potentially not limited to this pathology report.     Modified Classification:    y      pT Category:    pT3c      pN Category: pN not assigned (no nodes submitted or found)      pM Category:    pM1     FIGO STAGE     FIGO Stage:    IIIC        Diagnosis Comment   Date Value Ref Range Status   02/15/2022   Final    Tumor is microscopically composed of extensive necrotic tissue, residual mature teratoma, proliferation of mature neural tissue, microscopic foci of immature teratoma elements (immature neural tissue with poorly formed rosettes in C5 and immature cartilage in B6 and B7),and residual yolk sac tumor (IHC positive for SALL4, AFP and Glypican-3). In the post-treatment tumor, we do not see evidence of embryonal carcinoma.         02/15/2022   Final    Tumor is microscopically composed of extensive necrotic tissue, residual mature teratoma, proliferation of mature neural tissue, microscopic foci of immature teratoma elements (immature neural tissue with poorly formed rosettes in C5 and immature cartilage in B6 and B7),and residual yolk sac tumor (IHC positive for SALL4, AFP and Glypican-3). In the post-treatment tumor, we do not see evidence of embryonal carcinoma.               Stage/Plan: Debra Ponce was discussed in our multidisciplinary conference. Where level one evidence and NCCN guidelines are available, these were used to guide treatment recommendations. Where this level of evidence does not exist, the participants discussed the literature to develop a consensus for treatment. Comprehensive assessment of the patient???s malignancy, staging, need for surgery, chemotherapy, radiation therapy, eligibility for clinical trial participation and need for further testing were reviewed. This multidisciplinary conference took place involving physicians from Gynecologic Oncology, Pathology, and Radiation Oncology as well as a Dentist, Gynecologic Oncology nurse clinicians and Gynecologic Oncology Service fellows and residents.     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  -     References:      Ultimately, the final plan of care will be developed in consultation between the patient and her attending physician. Based on the nuances of the patient's medical condition, her primary oncologist is in the best position to make final treatment recommendations.    ***This note is not yet finalized***

## 2022-02-21 NOTE — Telephone Encounter (Signed)
Fellow resident from [facility] with Dr Berline Lopes called and scheduled a post op appt with Dr Berline Lopes for 1/19

## 2022-02-22 LAB — BASIC METABOLIC PANEL
ANION GAP: 7 mmol/L (ref 5–14)
BLOOD UREA NITROGEN: 6 mg/dL — ABNORMAL LOW (ref 9–23)
BUN / CREAT RATIO: 13
CALCIUM: 8.2 mg/dL — ABNORMAL LOW (ref 8.7–10.4)
CHLORIDE: 107 mmol/L (ref 98–107)
CO2: 26 mmol/L (ref 20.0–31.0)
CREATININE: 0.47 mg/dL — ABNORMAL LOW
EGFR CKD-EPI (2021) FEMALE: >90 mL/min/{1.73_m2} (ref >=60)
GLUCOSE RANDOM: 82 mg/dL (ref 70–179)
POTASSIUM: 4.8 mmol/L (ref 3.4–4.8)
SODIUM: 140 mmol/L (ref 135–145)

## 2022-02-22 LAB — CBC
HEMATOCRIT: 33 % — ABNORMAL LOW (ref 34.0–44.0)
HEMOGLOBIN: 10.7 g/dL — ABNORMAL LOW (ref 11.3–14.9)
MEAN CORPUSCULAR HEMOGLOBIN CONC: 32.4 g/dL (ref 32.0–36.0)
MEAN CORPUSCULAR HEMOGLOBIN: 27 pg (ref 25.9–32.4)
MEAN CORPUSCULAR VOLUME: 83.3 fL (ref 77.6–95.7)
MEAN PLATELET VOLUME: 6.8 fL (ref 6.8–10.7)
PLATELET COUNT: 620 10*9/L — ABNORMAL HIGH (ref 150–450)
RED BLOOD CELL COUNT: 3.96 10*12/L (ref 3.95–5.13)
RED CELL DISTRIBUTION WIDTH: 18.4 % — ABNORMAL HIGH (ref 12.2–15.2)
WBC ADJUSTED: 17.9 10*9/L — ABNORMAL HIGH (ref 3.6–11.2)

## 2022-02-22 LAB — AMYLASE: AMYLASE: 43 U/L (ref 30–118)

## 2022-02-22 LAB — LIPASE: LIPASE: 35 U/L (ref 12–53)

## 2022-02-22 MED ORDER — DOCUSATE SODIUM 100 MG CAPSULE
ORAL_CAPSULE | Freq: Two times a day (BID) | ORAL | 0 refills | 30.00000 days
Start: 2022-02-22 — End: 2022-03-24

## 2022-02-22 MED ORDER — FLUOXETINE 10 MG CAPSULE
ORAL_CAPSULE | Freq: Every day | ORAL | 0 refills | 30.00000 days
Start: 2022-02-22 — End: 2022-03-24

## 2022-02-22 MED ORDER — POLYETHYLENE GLYCOL 3350 17 GRAM/DOSE ORAL POWDER
Freq: Every day | ORAL | 0 refills | 28.00000 days
Start: 2022-02-22 — End: 2022-03-24

## 2022-02-22 MED ORDER — ONDANSETRON 8 MG DISINTEGRATING TABLET
ORAL_TABLET | Freq: Three times a day (TID) | ORAL | 0 refills | 20.00000 days | PRN
Start: 2022-02-22 — End: 2022-03-24

## 2022-02-22 MED ORDER — OXYCODONE 5 MG TABLET
ORAL_TABLET | ORAL | 0 refills | 2.00000 days | PRN
Start: 2022-02-22 — End: 2022-02-27

## 2022-02-22 MED ORDER — SIMETHICONE 80 MG CHEWABLE TABLET
ORAL_TABLET | Freq: Four times a day (QID) | ORAL | 0 refills | 25.00000 days | PRN
Start: 2022-02-22 — End: 2022-03-24

## 2022-02-22 MED ADMIN — acetaminophen (TYLENOL) tablet 650 mg: 650 mg | ORAL | @ 18:00:00

## 2022-02-22 MED ADMIN — acetaminophen (TYLENOL) tablet 650 mg: 650 mg | ORAL | @ 14:00:00

## 2022-02-22 MED ADMIN — oxyCODONE (ROXICODONE) immediate release tablet 5 mg: 5 mg | ORAL | @ 11:00:00 | Stop: 2022-03-07

## 2022-02-22 MED ADMIN — enoxaparin (LOVENOX) syringe 40 mg: 40 mg | SUBCUTANEOUS | @ 14:00:00

## 2022-02-22 MED ADMIN — oxyCODONE (ROXICODONE) immediate release tablet 10 mg: 10 mg | ORAL | Stop: 2022-03-07

## 2022-02-22 MED ADMIN — famotidine (PF) (PEPCID) injection 20 mg: 20 mg | INTRAVENOUS | @ 14:00:00

## 2022-02-22 MED ADMIN — simethicone (MYLICON) chewable tablet 80 mg: 80 mg | ORAL | @ 23:00:00

## 2022-02-22 MED ADMIN — pregabalin (LYRICA) capsule 25 mg: 25 mg | ORAL | @ 02:00:00

## 2022-02-22 MED ADMIN — oxyCODONE (ROXICODONE) immediate release tablet 10 mg: 10 mg | ORAL | @ 05:00:00 | Stop: 2022-03-07

## 2022-02-22 MED ADMIN — acetaminophen (TYLENOL) tablet 650 mg: 650 mg | ORAL | @ 02:00:00

## 2022-02-22 MED ADMIN — oxyCODONE (ROXICODONE) immediate release tablet 5 mg: 5 mg | ORAL | @ 21:00:00 | Stop: 2022-03-07

## 2022-02-22 MED ADMIN — famotidine (PF) (PEPCID) injection 20 mg: 20 mg | INTRAVENOUS | @ 02:00:00

## 2022-02-22 MED ADMIN — oxyCODONE (ROXICODONE) immediate release tablet 5 mg: 5 mg | ORAL | @ 16:00:00 | Stop: 2022-03-07

## 2022-02-22 NOTE — Unmapped (Signed)
Pt alert and oriented*4. Vital signs stable, heart rete elevated. Epidural discontinued this morning, pain controlled with PRN oxycodone. No complained of nausea and vomiting. 1 unit of blood transfused, no reaction noted. Surgical site CDI. Ostomy started putting output, voiding adequate. 1 JP drain took out by MD, another  one intact. Regular diet well tolerated. Safety measures in place. Will continue to monitor.   Problem: Latex Allergy  Goal: Absence of Allergy Symptoms  Outcome: Progressing     Problem: Wound  Goal: Optimal Coping  Outcome: Progressing  Goal: Optimal Functional Ability  Outcome: Progressing  Goal: Absence of Infection Signs and Symptoms  Outcome: Progressing  Goal: Improved Oral Intake  Outcome: Progressing  Goal: Optimal Pain Control and Function  Outcome: Progressing  Goal: Skin Health and Integrity  Outcome: Progressing  Goal: Optimal Wound Healing  Outcome: Progressing     Problem: Adult Inpatient Plan of Care  Goal: Plan of Care Review  Outcome: Progressing  Goal: Patient-Specific Goal (Individualized)  Outcome: Progressing  Goal: Absence of Hospital-Acquired Illness or Injury  Outcome: Progressing  Intervention: Prevent and Manage VTE (Venous Thromboembolism) Risk  Recent Flowsheet Documentation  Taken 02/21/2022 0910 by Donzetta Starch, RN  VTE Prevention/Management:   anticoagulant therapy   ambulation promoted   bleeding precautions maintained  Anti-Embolism Device Type: SCD, Knee  Anti-Embolism Intervention: On  Anti-Embolism Device Location: BLE  Goal: Optimal Comfort and Wellbeing  Outcome: Progressing  Goal: Readiness for Transition of Care  Outcome: Progressing  Goal: Rounds/Family Conference  Outcome: Progressing     Problem: Self-Care Deficit  Goal: Improved Ability to Complete Activities of Daily Living  Outcome: Progressing     Problem: Fall Injury Risk  Goal: Absence of Fall and Fall-Related Injury  Outcome: Progressing     Problem: Skin Injury Risk Increased  Goal: Skin Health and Integrity  Outcome: Progressing     Problem: Malnutrition  Goal: Improved Nutritional Intake  Outcome: Progressing

## 2022-02-22 NOTE — Unmapped (Signed)
Surgery Progress Note    Hospital Day: 8    Assessment:     Debra Ponce is a 52 y.o. 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 She underwent an exploratory laparotomy, low anterior resection with diverting loop ileostomy, TAH and BSO, splenectomy, cholecystectomy, and appendectomy and received IP cisplatin intraoperatively.    Interval Events:     NAEON. No nausea or vomiting. Gas and stool from ileostomy. She reports urinating often. Hgb was 10.7 this morning from 7.3 after a UoRBC.  Plan:   Postoperative pain management:  - Epidural removed  - Pain well controlled with oxy     CV: VSS    Pulm:   -  Encouraging IS and ambulation.     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  I reviewed the resident???s note.  I agree with the resident???s findings and plan. Tommie Raymond, MD      Objective:      Vital Signs:  BP 134/81  - Pulse 80  - Temp 36.5 ??C (97.7 ??F) (Oral)  - Resp 16  - Ht 149.9 cm (4' 11)  - Wt 56 kg (123 lb 7.3 oz)  - LMP 11/14/2020 (Approximate)  - SpO2 99%  - BMI 24.94 kg/m??     Input/Output:  I/O         01/07 0701  01/08 0700 01/08 0701  01/09 0700 01/09 0701  01/10 0700    P.O. 240 240     I.V. (mL/kg)       Blood  380     IV Piggyback 100 150     Total Intake 340 770     Urine (mL/kg/hr) 4475 (3.3) 2650 (2)     Emesis/NG output 0      Drains 117 100     Stool 450 600     Total Output(mL/kg) 5042 (90) 3350 (59.8)     Net -4702 -2580            Urine Occurrence 1 x      Stool Occurrence  0 x     Emesis Occurrence 0 x              Physical Exam:    General: Cooperative, no distress, well appearing  HEENT:  Normocephalic, atraumatic  Pulmonary: Normal work of breathing, equal bilateral chest rise  Cardiovascular: Regular tachycardia  Abdomen: Soft, appropriately tender, mildly distended. Incisions c/d/i with dermabond in place. Drains with s/s output. Ileostomy is pink, stool and gas in the bag.  Extremities: Minimal peripheral edema    Labs:    Lab Results   Component Value Date    WBC 17.9 (H) 02/22/2022    HGB 10.7 (L) 02/22/2022    HCT 33.0 (L) 02/22/2022    PLT 620 (H) 02/22/2022       Lab Results   Component Value Date    NA 142 02/21/2022    K 3.5 02/21/2022    CL 111 (H) 02/21/2022    CO2 24.0 02/21/2022    BUN <5 (L) 02/21/2022    CREATININE 0.39 (L) 02/21/2022    CALCIUM 7.7 (L) 02/21/2022    MG 1.3 (L) 02/21/2022    PHOS 3.1 02/21/2022       Microbiology Results (last day)       Procedure Component Value Date/Time Date/Time    Amylase, body fluid [[id number]] Collected: 02/16/22 0350    Lab Status: Final result Specimen: Fluid, Peritoneal Updated: 02/16/22 0451     Amylase, Fluid 214 U/L      Comment: Peritional fluid amylase >3-5X serum amylase suggests pancreatic ascities.        Amylase, Fluid Type Fluid, Peritoneal    Narrative:      The reference range for this body fluid has not been established. The test result must be integrated into the clinical context for interpretation.  This test was developed and its performance characteristics determined by the [facility] of the [employer] [employer]. This test has not been cleared or approved by the FDA. The laboratory is regulated under CAP and CLIA as qualified to perform high-complexity testing. This test is to be used for clinical purposes and should not be regarded as investigational or for research.            Imaging:   No results found.

## 2022-02-22 NOTE — Unmapped (Addendum)
Debra Ponce is a 52 y.o. female admitted on 02/15/2022 for diagnostic laparoscopy with pelvic peritonectomy en bloc, low anterior resection, resection of mass at the hepatic flexure, omentectomy, liver capsule resection, splenectomy, cholecystectomy, appendectomy, ablation of mesenteric nodules, diverting loop ileostomy, and TAH-BSO they tolerated the procedure well, was extubated in the OR, and was taken to the PACU where they received routine postoperative care.  They were transferred to the floor and continued to do well postoperatively. Patients pain was well controlled with an epidural and they were eventually transitioned to oral pain medications with appropriate pain control. Their diet was slowly advanced and at the time of discharge they were tolerating a regular diet.  The patient was able to void spontaneously and their pain was well-controlled with oral pain medication.  The patient was assessed by the surgery team and found to be suitable for discharge to home.  They will be discharged on 7 Days Post-Op in stable condition with prescriptions for pain medications. They received the splenectomy associated vaccines at the day of discharge.

## 2022-02-22 NOTE — Unmapped (Signed)
Department of Anesthesiology  Pain Division  Acute Pain Pager [phone]        Acute Pain Service Follow-Up Note- Epidural      Assessment and Plan  Patient is a 53 y.o. s/p cytoreductive surgery and HIPEC who is now POD#  7  previously with an epidural catheter for postoperative pain.    Interval updates:   Pain well controlled overnight with current regimen. States her shoulder pain resolved with the addition of a lidocaine patch yesterday afternoon. Addition of Lyrica was also significantly beneficial in helping control pain overnight. Overall feels the current pain regimen is working to keep her comfortable.      Catheter status/changes: Epidural discontinued (date): 02/21/22  Blood thinner: heparin 5000u SQ  Additional pain/sedation medications: Catheter pulled, transition to primary service.   Current diet:   Active Orders   Diet    Nutrition Therapy Regular/House       Recommendations:  - Continue scheduled IV tylenol  - Continue oxycodone 5-10 mg PRN  - Continue IV dilaudid to PRN breakthrough pain  - Continue Lyrica 25 mg nightly  - Change Lidoderm to PRN if patient's pain has resolved.    We will sign off at this time.  Please contact the APS if we can be of further assistance with pain control.  Contact the Acute Pain Service first at [phone] with questions or concerns 24 hours a day.  If unable to reach, please contact on call Anesthesiology resident at [phone].     If applicable, naloxone available for sedation or respiratory depression.  If applicable, nalbuphine available for refractory pruritis.  Call APS for additional concerns.  No anesthesia/epidural need for foley catheter.  Contact APS before starting any anticoagulant other than Davy heparin.    CPT  daily management of epidural 21308   Diagnosis: abdominal pain      History  Patient is a 52 y.o. s/p cytoreductive surgery and HIPEC on 02/15/22 by Dr. Pricilla Holm and Vanessa Ralphs.    Daily Hospital Course  There were no Acute Events Overnight.   The patient is obtaining adequate pain relief on current medication regimen and feels that their pain is Well controlled    Epidural Catheter: Discontinued  Level: T8/9  LOR:   4.5 cm  Catheter at skin:  9.5 cm  Catheter Start Date: 02/15/22  Postop/Catheter Day: 5    Analgesia Evaluation:  Pain at rest: 0/10  Pain with activity: 3/10    Additional opioid and non-opioid analgesics:   IV acetaminophen 650 mg q6h  PO Lyrica 25 mg nightly  Lidoderm patch to affected shoulder  PO oxycodone 5-10 mg PRN  IV hydromorphone 0.25 mg PRN    Complications:  Pruritis: no  Urinary Retention: no  Nausea: No  Vomiting: No  Sedation: No  Respiratory Depression: No    Physical Exam  Temp:  [36.5 ??C (97.7 ??F)-37.3 ??C (99.1 ??F)] 37.1 ??C (98.8 ??F)  Core Temp:  [36.5 ??C (97.7 ??F)] 36.5 ??C (97.7 ??F)  Heart Rate:  [80-110] 110  SpO2 Pulse:  [99] 99  Resp:  [16-20] 18  BP: (111-134)/(72-90) 124/90  MAP (mmHg):  [83-102] 102  SpO2:  [96 %-99 %] 98 %  GEN: Patient sitting up in bed, in no apparent distress.  NG is not present.  CHEST: Unlabored breathing, supplemental oxygen is not present.  Chest tube is not present.  SKIN: epidural Catheter previously pulled, site unremarkable, no erythema at insertion site, no fluctuance or tenderness noted  GENITOURINARY:  foley catheter is not present  NEUROLOGIC: patient moves all extremities, 5/5 strength in lower extremities, alert and oriented to person, place and time; other deficits noted: None    Lab Results   Component Value Date    WBC 17.9 (H) 02/22/2022    RBC 3.96 02/22/2022    HGB 10.7 (L) 02/22/2022    HCT 33.0 (L) 02/22/2022    MCV 83.3 02/22/2022    MCH 27.0 02/22/2022    MCHC 32.4 02/22/2022    RDW 18.4 (H) 02/22/2022    PLT 620 (H) 02/22/2022    MPV 6.8 02/22/2022     Lab Results   Component Value Date    PT 15.7 (H) 01/09/2022    INR 1.42 01/09/2022     Lab Results   Component Value Date    CREATININE 0.47 (L) 02/22/2022

## 2022-02-22 NOTE — Unmapped (Signed)
GYN ONC PROGRESS NOTE    ASSESSMENT AND PLAN     Hospital Day: 8    Debra Ponce is a 52 y.o. who is POD#7 s/p EUA, exploratory laparotomy, TAH w/ radical dissection, b/l ureterolysis, BSO, posterior cul-de-sac peritonectomy, tumor debulking (Gyn Onc); diagnostic laparoscopy, LAR w primary end-to-end anastomosis, enterolysis, cholecystectomy, tumor capsule tumor debulking, splenectomy, appendectomy, omentectomy, diverting loop ileostomy formation with HIPEC (Surg Onc) on 02/15/21 for Stage IIIC mixed germ cell tumor. Patient is surgical oncology primary. Doing well postoperatively. Anticipate discharge this week.     ONC: Stage IIIC mixed germ cell tumor  - Primary Oncologist: Dr. Pricilla Holm  - Last treatment: Bleomycin, Etoposide, Cisplatin on 11/28 (s/p 2 cycles)  - Last CA-125: 469  - S/p above procedure: final path consistent with malignant germ cell tumor  - Patient has a follow up appointment with Dr. Pricilla Holm in [place] on 1/18 at 0830    Postoperative Care:  - H/H: 8.1 > EBL 1000 mL > 3u pRBCs intra-op >> POD#6 s/p 1u pRBCs > 10.7 this AM.  Recommend goal Hgb > 8 given anastomoses and plans for future treatment   - ERAS protocol for pain: sch tylenol, PRN Dilaudid. Epidural removed yesterday. Working on optimizing PO pain regimen  - On RA  - Medlocked  - NGT (1/2-1/5). Tolerated Regular diet yesterday  - Foley removed, urinating without difficulty   - S/p splenectomy. Will need post splenectomy vaccines prior to discharge   - WOCN following for ostomy teaching    Chronic Medical Conditions  - GAD: Home prozac 10 mg, ativan PRN held  - Sleep disturbance: Atarax nightly    PPX: SCDs, heparin, IS, ambulation. Recommend discharging on prophylactic Eliquis (was on this medication prior to surgery)    Dispo: Floor    Plan discussed with Dr. Jonny Ruiz and Attending Dr. Reyne Dumas.     SUBJECTIVE     NAEON. Patient feels well this morning. She tolerated [employer] yesterday. She is ambulating without difficulty. She reports her pain increased after her epidural was removed, but it is manageable today. Otherwise doing well with no concerns today.     OBJECTIVE     Temp:  [36.5 ??C (97.7 ??F)-37.3 ??C (99.1 ??F)] 36.5 ??C (97.7 ??F)  Heart Rate:  [80-109] 80  SpO2 Pulse:  [99] 99  Resp:  [16-20] 16  BP: (111-134)/(72-89) 134/81  SpO2:  [96 %-99 %] 99 %    Gen: NAD, awake, alert, oriented  CV: regular rate and rhythm   Pulm: clear to auscultation bilaterally, normal work of breathing  Abd: Soft, appropriately tender to palpation, non-distended, bowel sounds present; Ostomy with healthy appearing stoma, thin brown, gas output in bag. JP drains in LLQ with serosanguinous fluid.   Incision: midline vertical incision clean, dry, intact closed with suture, with no surrounding erythema or induration  GU: Deferred      Intake/Output Summary (Last 24 hours) at 02/22/2022 0735  Last data filed at 02/22/2022 0400  Gross per 24 hour   Intake 770 ml   Output 3350 ml   Net -2580 ml       Medications (scheduled)    acetaminophen  650 mg Oral Q6H    enoxaparin (LOVENOX) injection  40 mg Subcutaneous Q24H SCH    famotidine (PEPCID) IV  20 mg Intravenous BID    lidocaine  1 patch Transdermal Daily    pregabalin  25 mg Oral Nightly       Medications (prn)  dextrose in water,  glucagon, HYDROmorphone **OR** [DISCONTINUED] HYDROmorphone, hydrOXYzine, naloxone, ondansetron, oxyCODONE **OR** oxyCODONE, phenol, prochlorperazine (COMPAZINE) 5 mg in sodium chloride (NS) 0.9 % 25 mL IVPB    Labs:  Lab Results   Component Value Date    WBC 17.9 (H) 02/22/2022    HGB 10.7 (L) 02/22/2022    HCT 33.0 (L) 02/22/2022    PLT 620 (H) 02/22/2022       Lab Results   Component Value Date    NA 142 02/21/2022    K 3.5 02/21/2022    CL 111 (H) 02/21/2022    CO2 24.0 02/21/2022    BUN <5 (L) 02/21/2022    CREATININE 0.39 (L) 02/21/2022    GLU 82 02/21/2022    CALCIUM 7.7 (L) 02/21/2022    MG 1.3 (L) 02/21/2022    PHOS 3.1 02/21/2022       Lab Results   Component Value Date    BILITOT 0.3 02/20/2022    BILIDIR 0.30 01/10/2022    PROT 4.4 (L) 02/20/2022    ALBUMIN 1.8 (L) 02/20/2022    ALT 13 02/20/2022    AST 17 02/20/2022    ALKPHOS 149 (H) 02/20/2022       Lab Results   Component Value Date    INR 1.42 01/09/2022    APTT 33.4 01/09/2022     Scribe's Attestation: Yancey Flemings, MD obtained and performed the history, physical exam and medical decision making elements that were  entered into the chart. Documentation assistance was provided by me personally, a scribe. Signed by Marylin Crosby Scribe, on February 22, 2022 at 2:41 AM.     ----------------------------------------------------------------------------------------------------------------------  February 22, 2022 7:41 AM. Documentation assistance provided by the Scribe. I was present during the time the encounter was recorded. The information recorded by the Scribe was done at my direction and has been reviewed and validated by me.  ----------------------------------------------------------------------------------------------------------------------

## 2022-02-22 NOTE — Unmapped (Signed)
Updated on POC. VSS. Denies nausea. Pain medication given as ordered. Ambulates independently. JP drain intact. JP drain sample sent. Adequate urine.    Problem: Latex Allergy  Goal: Absence of Allergy Symptoms  Outcome: Ongoing - Unchanged     Problem: Wound  Goal: Optimal Coping  Outcome: Ongoing - Unchanged  Goal: Optimal Functional Ability  Outcome: Ongoing - Unchanged  Goal: Absence of Infection Signs and Symptoms  Outcome: Ongoing - Unchanged  Goal: Improved Oral Intake  Outcome: Ongoing - Unchanged  Goal: Optimal Pain Control and Function  Outcome: Ongoing - Unchanged  Goal: Skin Health and Integrity  Outcome: Ongoing - Unchanged  Goal: Optimal Wound Healing  Outcome: Ongoing - Unchanged     Problem: Adult Inpatient Plan of Care  Goal: Plan of Care Review  Outcome: Ongoing - Unchanged  Goal: Patient-Specific Goal (Individualized)  Outcome: Ongoing - Unchanged  Goal: Absence of Hospital-Acquired Illness or Injury  Outcome: Ongoing - Unchanged  Goal: Optimal Comfort and Wellbeing  Outcome: Ongoing - Unchanged  Goal: Readiness for Transition of Care  Outcome: Ongoing - Unchanged  Goal: Rounds/Family Conference  Outcome: Ongoing - Unchanged     Problem: Self-Care Deficit  Goal: Improved Ability to Complete Activities of Daily Living  Outcome: Ongoing - Unchanged     Problem: Fall Injury Risk  Goal: Absence of Fall and Fall-Related Injury  Outcome: Ongoing - Unchanged     Problem: Skin Injury Risk Increased  Goal: Skin Health and Integrity  Outcome: Ongoing - Unchanged     Problem: Malnutrition  Goal: Improved Nutritional Intake  Outcome: Ongoing - Unchanged

## 2022-02-22 NOTE — Unmapped (Signed)
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 She underwent an exploratory laparotomy, low anterior resection with diverting loop ileostomy, TAH and BSO, splenectomy, cholecystectomy, and appendectomy and received cisplatin intraoperatively.     CWOCN follow up ostomy teaching visit with pt.  She is sitting on edge of bed and ready to change her pouch.  She proceeded through pouch change with minimal standby assistance. We reviewed teaching booklet and supplies.       Ileostomy Teaching:  Educated patient on the following subjects:  -      Risks of dehydration; s/sx dehydration; proper response to dehydration  -      Risk of blockages; identifying blockages and proper response to address  -      Foods that affect ileostomy management: odor; thickeners/thinners; gas formation  -      burping appliance  -      Changing appliance, including  -      Removing wafer  -      Cleaning skin  -      Crusting impaired skin (if needed) using stomahesive powder and skin prep   -      Measuring and cutting ostomy wafer  -      Applying barrier ring or using stomahesive paste  -      Applying pouch  -      Attaching new pouch (if 2 piece)  -      Contacting vendors for sample supplies and assistance with pouching difficulties after discharge           Stoma Type:  -  Ileostomy Stoma Location:  - RLQ (Right Lower Quadrant)     Stoma Characteristics:  - Round  - Budded Stoma Mucosal Condition and Color:  - Moist  - Edematous  - Red     Mucocutaneous Junction:  - Intact    Rod/Stents:  - No     Output:  - Brown  - Pasty  - Stool     Peristomal Skin Condition:   - Intact    Abdominal Contours:  - Soft    Pouching System:  - 2 Piece  - Convex  - CTF (Cut to fit)  - Moldable barrier ring Anticipated Wear Time of Pouching System:  - 3 to 5 days  - To be determined     Teaching Limitations/Considerations:   - Anxiety    Teaching/Instructions:  - Discussed agreed upon goals that the patient/family member would be able to empty and change ostomy pouch, have the instructions needed to order home supplies and know how to trouble shoot pouch leakage and common peristomal skin problems.    Ostomy Home Starter Kit - Verbal Consent Obtained:  - Not asked    Recommendations/Plan:   - Unit to order discharge ostomy supplies.  - If pouch leaks, Change with bedside supplies.  - Final discharge ostomy supply list.    Ostomy Discharge Goals:  - Patient/family member able to empty pouch independently.  - Patient/family member able to change pouch independently.  - Patient/family member able to identify ostomy supplies to take home at discharge and  knowledge of supply order process.     Recommended Consults:   - Not Applicable Plan of Care Discussed With:  - Patient  - Family  - RN MOntrella     Ostomy Supplies:   - Supplies available on unit.  - Unit to order.  - Patient specific supplies at bedside.    Ostomy Product List:  Inpatient supply list: take home quantity listed     Pouch System:  Hollister 2-Piece Soft Convex Wafer Red (mfg 11703) - lawson number [phone]) order 5  Hollister 2-Piece Fecal Pouch Red (mfg 18003) - lawson number [phone]) order 5    Accessories:   [employer] (mfg [id number])- lawson number [phone]) order 5  Esenta ConvaTec Adhesive Remover Wipes (mfg [id number])- lawson number [phone]) order 1 box  Hollister Ostomy Belt 23-43 (mfg 7300) - lawson number [phone]) order 1  72M No-Sting Barrier Film- Spray (mfg 3346) - lawson number ([id number]) order 1     Outpatient Supply List   30 day supply     Pouching System   Hollister [phone]) 2 piece wafer convex extended wear (red soft); 20 per month  Hollister (18003) 2 piece pouch with flange drainable; 20 per month    Accessories  Coloplast [phone]) barrier ring, 20 per month  Convatec (542706) adhesive remover wipe, 50 per month  Hollister (7300) ostomy belt small, 1 per month  Hollister [phone]) stoma powder, 10 ounces every 6 months  72M (3346) skin barrier spray, 2 ounces per month    WOCN Follow-up:  - We will sign off at this time    Workup Time:  60 minutes    Cherylann Ratel MA BSN RN [facility]  [facility] Consult Service  Epic Chat or [phone]

## 2022-02-23 DIAGNOSIS — C569 Malignant neoplasm of unspecified ovary: Principal | ICD-10-CM

## 2022-02-23 DIAGNOSIS — Z932 Ileostomy status: Principal | ICD-10-CM

## 2022-02-23 LAB — CBC
HEMATOCRIT: 34.5 % (ref 34.0–44.0)
HEMOGLOBIN: 11.2 g/dL — ABNORMAL LOW (ref 11.3–14.9)
MEAN CORPUSCULAR HEMOGLOBIN CONC: 32.4 g/dL (ref 32.0–36.0)
MEAN CORPUSCULAR HEMOGLOBIN: 26.8 pg (ref 25.9–32.4)
MEAN CORPUSCULAR VOLUME: 82.8 fL (ref 77.6–95.7)
MEAN PLATELET VOLUME: 6.5 fL — ABNORMAL LOW (ref 6.8–10.7)
PLATELET COUNT: 634 10*9/L — ABNORMAL HIGH (ref 150–450)
RED BLOOD CELL COUNT: 4.17 10*12/L (ref 3.95–5.13)
RED CELL DISTRIBUTION WIDTH: 18.7 % — ABNORMAL HIGH (ref 12.2–15.2)
WBC ADJUSTED: 18.4 10*9/L — ABNORMAL HIGH (ref 3.6–11.2)

## 2022-02-23 LAB — LIPASE: LIPASE: 42 U/L (ref 12–53)

## 2022-02-23 LAB — PHOSPHORUS: PHOSPHORUS: 3.3 mg/dL (ref 2.4–5.1)

## 2022-02-23 LAB — BASIC METABOLIC PANEL
ANION GAP: 4 mmol/L — ABNORMAL LOW (ref 5–14)
BLOOD UREA NITROGEN: 8 mg/dL — ABNORMAL LOW (ref 9–23)
BUN / CREAT RATIO: 18
CALCIUM: 8.8 mg/dL (ref 8.7–10.4)
CHLORIDE: 104 mmol/L (ref 98–107)
CO2: 29 mmol/L (ref 20.0–31.0)
CREATININE: 0.44 mg/dL — ABNORMAL LOW
EGFR CKD-EPI (2021) FEMALE: >90 mL/min/{1.73_m2} (ref >=60)
GLUCOSE RANDOM: 98 mg/dL (ref 70–179)
POTASSIUM: 4.4 mmol/L (ref 3.4–4.8)
SODIUM: 137 mmol/L (ref 135–145)

## 2022-02-23 LAB — MAGNESIUM: MAGNESIUM: 1.5 mg/dL — ABNORMAL LOW (ref 1.6–2.6)

## 2022-02-23 LAB — AMYLASE: AMYLASE: 58 U/L (ref 30–118)

## 2022-02-23 MED ORDER — FLUOXETINE 10 MG CAPSULE
ORAL_CAPSULE | Freq: Every day | ORAL | 0 refills | 30.00000 days | Status: CP
Start: 2022-02-23 — End: 2022-03-25
  Filled 2022-02-23: qty 30, 30d supply, fill #0

## 2022-02-23 MED ORDER — ONDANSETRON 8 MG DISINTEGRATING TABLET
ORAL_TABLET | Freq: Three times a day (TID) | ORAL | 0 refills | 20.00000 days | Status: CP | PRN
Start: 2022-02-23 — End: 2022-03-25
  Filled 2022-02-23: qty 60, 20d supply, fill #0

## 2022-02-23 MED ORDER — SIMETHICONE 80 MG CHEWABLE TABLET
ORAL_TABLET | Freq: Four times a day (QID) | ORAL | 0 refills | 25.00000 days | Status: CP | PRN
Start: 2022-02-23 — End: 2022-03-25
  Filled 2022-02-23: qty 100, 25d supply, fill #0

## 2022-02-23 MED ORDER — OXYCODONE 5 MG TABLET
ORAL_TABLET | ORAL | 0 refills | 4.00000 days | Status: CP | PRN
Start: 2022-02-23 — End: 2022-02-28
  Filled 2022-02-23: qty 20, 4d supply, fill #0

## 2022-02-23 MED ORDER — PREGABALIN 25 MG CAPSULE
ORAL_CAPSULE | Freq: Every evening | ORAL | 0 refills | 15.00000 days | Status: CP
Start: 2022-02-23 — End: 2022-03-25
  Filled 2022-02-23: qty 15, 15d supply, fill #0

## 2022-02-23 MED ORDER — POLYETHYLENE GLYCOL 3350 17 GRAM/DOSE ORAL POWDER
Freq: Every day | ORAL | 0 refills | 28.00000 days | Status: CP
Start: 2022-02-23 — End: 2022-03-25
  Filled 2022-02-23: qty 238, 14d supply, fill #0

## 2022-02-23 MED ORDER — DOCUSATE SODIUM 100 MG CAPSULE
ORAL_CAPSULE | Freq: Two times a day (BID) | ORAL | 0 refills | 30.00000 days | Status: CP
Start: 2022-02-23 — End: 2022-03-25
  Filled 2022-02-23: qty 60, 30d supply, fill #0

## 2022-02-23 MED ADMIN — haemophilus B polysac-tetanus toxoid (ActHIB) injection 0.5 mL: .5 mL | INTRAMUSCULAR | @ 20:00:00 | Stop: 2022-02-23

## 2022-02-23 MED ADMIN — oxyCODONE (ROXICODONE) immediate release tablet 5 mg: 5 mg | ORAL | Stop: 2022-03-07

## 2022-02-23 MED ADMIN — acetaminophen (TYLENOL) tablet 650 mg: 650 mg | ORAL | @ 23:00:00

## 2022-02-23 MED ADMIN — acetaminophen (TYLENOL) tablet 650 mg: 650 mg | ORAL | @ 16:00:00

## 2022-02-23 MED ADMIN — meningococcal conjugate vaccine (MENVEO) 10 mcg-5 mcg/0.5 mL intramuscular injection (one-vial): .5 mL | INTRAMUSCULAR | @ 20:00:00 | Stop: 2022-02-23

## 2022-02-23 MED ADMIN — famotidine (PF) (PEPCID) injection 20 mg: 20 mg | INTRAVENOUS | @ 02:00:00

## 2022-02-23 MED ADMIN — enoxaparin (LOVENOX) syringe 40 mg: 40 mg | SUBCUTANEOUS | @ 16:00:00

## 2022-02-23 MED ADMIN — iron dextran (INFED) 975 mg in sodium chloride (NS) 0.9 % 500 mL IVPB: 975 mg | INTRAVENOUS | @ 20:00:00 | Stop: 2022-02-23

## 2022-02-23 MED ADMIN — meningococcal B vaccine (Bexsero): .5 mL | INTRAMUSCULAR | @ 20:00:00 | Stop: 2022-02-23

## 2022-02-23 MED ADMIN — iron dextran (INFED) 25 mg in sodium chloride (NS) 0.9 % 25 mL IVPB: 25 mg | INTRAVENOUS | @ 17:00:00 | Stop: 2022-02-23

## 2022-02-23 MED ADMIN — acetaminophen (TYLENOL) tablet 650 mg: 650 mg | ORAL | @ 02:00:00

## 2022-02-23 MED ADMIN — oxyCODONE (ROXICODONE) immediate release tablet 5 mg: 5 mg | ORAL | @ 16:00:00 | Stop: 2022-03-07

## 2022-02-23 MED ADMIN — famotidine (PF) (PEPCID) injection 20 mg: 20 mg | INTRAVENOUS | @ 17:00:00

## 2022-02-23 MED ADMIN — pregabalin (LYRICA) capsule 25 mg: 25 mg | ORAL | @ 02:00:00

## 2022-02-23 MED ADMIN — diphenhydrAMINE (BENADRYL) injection 25 mg: 25 mg | INTRAVENOUS | @ 16:00:00 | Stop: 2022-02-24

## 2022-02-23 MED ADMIN — magnesium oxide (MAG-OX) tablet 400 mg: 400 mg | ORAL

## 2022-02-23 MED ADMIN — diphenhydrAMINE (BENADRYL) capsule/tablet 25 mg: 25 mg | ORAL | @ 16:00:00 | Stop: 2022-02-23

## 2022-02-23 MED ADMIN — oxyCODONE (ROXICODONE) immediate release tablet 5 mg: 5 mg | ORAL | @ 10:00:00 | Stop: 2022-03-07

## 2022-02-23 MED ADMIN — simethicone (MYLICON) chewable tablet 80 mg: 80 mg | ORAL | @ 12:00:00

## 2022-02-23 MED ADMIN — sodium chloride (NS) 0.9 % infusion: 20 mL/h | INTRAVENOUS | @ 17:00:00 | Stop: 2022-02-23

## 2022-02-23 MED ADMIN — oxyCODONE (ROXICODONE) immediate release tablet 5 mg: 5 mg | ORAL | @ 02:00:00 | Stop: 2022-03-07

## 2022-02-23 MED ADMIN — pneumoc 20-val conj-dip cr(PF) (PREVNAR-20) vaccine 0.5 mL: .5 mL | INTRAMUSCULAR | @ 20:00:00 | Stop: 2022-02-23

## 2022-02-23 NOTE — Unmapped (Addendum)
Adult Nutrition Progress Note    Visit Type: Follow-Up  Reason for Visit: PO Intake      Nutrition Progress:   Patient reports strong appetite and consistent PO intakes with at least 3 meals per day since diet advancement 1/8. She previously required NPO/CLD 1/2-7. She does not tolerate ensures well, and prefers to focus on regular foods while tolerating currently.       Anthropometric Data:  Height: 149.9 cm (4' 11)   Admission weight: 56 kg (123 lb 7.3 oz)  Last recorded weight: 56 kg (123 lb 7.3 oz)  IBW: 45.45 kg  Percent IBW: 123.21 %  BMI: Body mass index is 24.94 kg/m??.   Usual Body Weight: 115 lb    Weight history prior to admission: Patient reports fluctuating weight over the past 1.5 year, with some gains being attributed to tumor growth. Consider rechecking current weight, as this does not appear consistent with reported intakes/time frame.  Wt Readings from Last 10 Encounters:   02/17/22 56 kg (123 lb 7.3 oz)   02/04/22 49.4 kg (109 lb)   02/04/22 49.5 kg (109 lb 1.6 oz)   01/27/22 49.3 kg (108 lb 9.6 oz)   01/25/22 48.6 kg (107 lb 3.2 oz)   01/19/22 49.1 kg (108 lb 4.8 oz)        Weight changes this admission:   Last 5 Recorded Weights    02/17/22 0636   Weight: 56 kg (123 lb 7.3 oz)        Nutrition Focused Physical Exam:  Nutrition Focused Physical Exam:  Fat Areas Examined  Orbital: Mild loss  Upper Arm: Moderate loss      Muscle Areas Examined  Temple: Moderate loss  Clavicle: Moderate loss  Acromion: Moderate loss  Patellar: Moderate loss  Anterior Thigh: Moderate loss              Nutrition Evaluation  Overall Impressions: Moderate fat loss;Moderate muscle loss (02/19/22 1719)  Nutrition Designation: Normal weight (BMI 18.50 - 24.99 kg/m2) (02/19/22 1719)      NUTRITIONALLY RELEVANT DATA     Medications:   Nutritionally pertinent medications reviewed and evaluated for potential food and/or medication interactions.  4 g IV Mag    Labs:   Nutritionally pertinent labs reviewed and include Magnesium: 1.5 mg/dL      Nutrition History:   February 19, 2022: Prior to admission: Patient reports appetite/intake fluctuations due to chemotherapy. Patient states her dietary intake was low after chemo and began to improve over the past month until recent surgery. Patient states that she does not follow any special diet at home; received ostomy education with dietary recommendations since placement. Patient is currently NPO, having ice chips only at this time. 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 Patient endorses some nausea/no emesis.    Allergies, Intolerances, Sensitivities, and/or Cultural/Religious Dietary Restrictions: none identified per chart review at this time     Current Nutrition:  Oral intake          Nutritional Needs:   Daily Estimated Nutrient Needs:  Energy: 0865-7846 kcal/day (BMR x 1.2-1.4) kcals Per [place] St-Jeor Equation using admission body weight, 56 kg (02/19/22 1118)]  Protein: 82-95 gm/day gm [25% of kcal using admission body weight, 56 kg (02/19/22 1118)]  Carbohydrate:   [no restriction]  Fluid:   mL [1 mL/kcal (maintenance)]      Malnutrition Assessment using AND/ASPEN Clinical Characteristics:    Non-severe (Moderate) Protein-Calorie Malnutrition in the context of chronic illness (02/19/22 1726)  Muscle  Loss: Moderate  Fluid Accumulation: Moderate  Malnutrition Score: 2    GOALS and EVALUATION     Patient to consume 75% or greater of po intake via combination of meals, snacks, and/or oral supplements within admit.  - New  Patient to remain NPO and/or on a clear liquid diet less than 5 days before diet advancement. - Met    Motivation, Barriers, and Compliance:  Evaluation of motivation, barriers, and compliance completed. No concerns identified at this time.     NUTRITION ASSESSMENT     Current nutrition therapy is appropriate and progressing toward meeting meeting nutritional needs at this time.       Discharge Planning:   Monitor for potential discharge needs with multi-disciplinary team.     Was the nutrition care plan completed? Yes      NUTRITION INTERVENTIONS and RECOMMENDATION     Continue surgical GI diet as tolerated.    Weekly weights.    Follow-Up Parameters:   1-2 times per week (and more frequent as indicated)    Gladys Damme MS, RD, LD, CNSC  [phone]

## 2022-02-23 NOTE — Unmapped (Signed)
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)      OR Procedures:    LAPAROSCOPY, ABDOMEN, PERITONEUM, & OMENTUM, DIAGNOSTIC, W/WO COLLECTION SPECIMEN(S) BY BRUSHING OR WASHING  EXCISION/DESTRUCTION, OPEN, INTRA-ABD TUMOR/CYST/ENDOMETRIOMA, 1+ PERITONEAL/MESENTERI/RETROPERIT 5CM OR <  COLECTOMY, PARTIAL; WITH ANASTOMOSIS  COLOSTOMY OR SKIN LEVEL CECOSTOMY  SPLENECTOMY; TOT (SEPART PROC)  EXPLORATORY LAPAROTOMY, EXPLORATORY CELIOTOMY WITH OR WITHOUT BIOPSY(S)  HYPERTHERMIA, EXTERNALLY GENERATED; DEEP  CHEMOTHERAPY ADMINISTRATION INTO THE PERITONEAL CAVITY VIA INDWELLING PORT OR CATHETER  RESECTION (INITIAL) OVARIAN, TUBAL/PRIM PERITONEAL MALIG W/BIL S&O/OMENTECT; W/RAD DISSECTION FOR DEBULKING  CHOLECYSTECTOMY  APPENDECTOMY  OMENTECTOMY, EPIPLOECTOMY, RESECTION OF OMENTUM  Date  02/15/2022  -------------------     Subjective   No acute events overnight. Pain Controlled. No fever or chills.    Objective   Patient Vitals for the past 8 hrs:   BP Temp Temp src Pulse Resp SpO2   02/23/22 1200 111/87 36.6 ??C (97.9 ??F) Oral 111 18 97 %     I/O this shift:  In: 240 [P.O.:240]  Out: 550 [Urine:450; Stool:100]    General Appearance:   No acute distress  Lungs:                Clear to auscultation bilaterally  Heart:                           Regular rate and rhythm  Abdomen:                Soft, non-tender, non-distended  Extremities:              Warm and well perfused    Hospital Course:  Debra Ponce is a 52 y.o. female admitted on 02/15/2022 for diagnostic laparoscopy with pelvic peritonectomy en bloc, low anterior resection, resection of mass at the hepatic flexure, omentectomy, liver capsule resection, splenectomy, cholecystectomy, appendectomy, ablation of mesenteric nodules, diverting loop ileostomy, and TAH-BSO they tolerated the procedure well, was extubated in the OR, and was taken to the PACU where they received routine postoperative care.  They were transferred to the floor and continued to do well postoperatively. Patients pain was well controlled with an epidural and they were eventually transitioned to oral pain medications with appropriate pain control. Their diet was slowly advanced and at the time of discharge they were tolerating a regular diet.  The patient was able to void spontaneously and their pain was well-controlled with oral pain medication.  The patient was assessed by the surgery team and found to be suitable for discharge to home.  They will be discharged on 7 Days Post-Op in stable condition with prescriptions for pain medications. They received the splenectomy associated vaccines at the day of discharge.    Condition at Discharge: Improved  Discharge Medications:      Medication List      START taking these medications     oxyCODONE 5 MG immediate release tablet; Commonly known as: ROXICODONE;   Take 1 tablet (5 mg total) by mouth every four (4) hours as needed.   pregabalin 25 MG capsule; Commonly known as: LYRICA; Take 1 capsule (25   mg total) by mouth nightly.  CONTINUE taking these medications     acetaminophen 325 MG tablet; Commonly known as: TYLENOL   docusate sodium 100 MG capsule; Commonly known as: COLACE; Take 1   capsule (100 mg total) by mouth two (2) times a day.   ELIQUIS 2.5 mg Tab; Generic drug: apixaban   famotidine 10 MG tablet; Commonly known as: PEPCID   FLUoxetine 10 MG capsule; Commonly known as: PROZAC; Take 1 capsule (10   mg total) by mouth daily.   GAS RELIEF 80 (SIMETHICONE) 80 MG chewable tablet; Generic drug:   simethicone; Chew 1 tablet (80 mg total) every six (6) hours as needed.   HIGH POTENCY MULTIVIT (W-IRON) 9 mg iron-400 mcg tablet; Generic drug:   multivitamins, therapeutic with minerals; Take 1 tablet by mouth daily.   LORazepam 0.5 MG tablet; Commonly known as: ATIVAN   metoPROLOL succinate 50 MG 24 hr tablet; Commonly known as: TOPROL XL;   Take 1 tablet (50 mg total) by mouth daily.   NEULASTA 6 mg/0.74mL injection; Generic drug: pegfilgrastim; Inject 0.6   mL (6 mg total) under the skin every twenty-one (21) days. Administer   24-48 hours after completion of inpatient chemotherapy.   ondansetron 8 MG disintegrating tablet; Commonly known as: ZOFRAN-ODT;   Dissolve 1 tablet (8 mg total) by mouth every eight (8) hours as needed.   polyethylene glycol 17 gram/dose powder; Commonly known as: GLYCOLAX;   Mix 1 capful (17 g) in 4-8 ounces of liquid and drink it by mouth daily.   prochlorperazine 10 MG tablet; Commonly known as: COMPAZINE; Take 1   tablet (10 mg total) by mouth every six (6) hours as needed (breakthrough   nausea or vomiting) for up to 8 days.       Pending Test Results:   None    Discharge Instructions:    Other Instructions       Discharge instructions      Surgical Oncology DC Instructions: Surgical Oncology GI Surgery Discharge       DIET: You may resume a regular diet as tolerated. Nutritional supplements such as Boost, Ensure or [employer] are encouraged (unless you are on a clear liquid diet, or otherwise instructed to avoid nutritional supplements).    INCISION CARE: Look at your incisions at least twice a day. A small amount of drainage is normal. If your doctor put surgical glue on your incisions, it will fall off gradually by itself, usually about 7-10 days after surgery. Do not pull it off yourself. You can put gauze over the incisions for the first week to protect clothing from any drainage. Avoid wearing tight clothing.    ILEOSTOMY CARE:     You have a new ileostomy, which is a portion of your small bowel brought to the surface of the skin.  You can expect your stool to be a variety of consistencies, mostly semiformed and soft (like applesauce). You should continue to pouch your ileostomy as you were taught while you were in the hospital.      Most people with ileostomies have less than 1 liter (1,000 mLs) of stool production daily (everyone is different). You should monitor your output and contact us if the consistency becomes watery and the amount of stool increases dramatically, as you may be at risk for becoming dehydrated.     If you are having more than 1,000 mLs of stool out of your ileostomy per day, begin taking imodium.   - START with 1 tablet (2mg  of  imodium) twice daily, and continue to measure your stool output.   - If you continue to have greater than 1,000 mLs/day INCREASE your imodium to 1 tablet (2 mg) of imodium 4 times daily.   - After 24 hrs, if you continue to have greater than 1,000 mLs/day INCREASE your imodium to 2 tablets (4 mg) of imodium 3 times daily.   - After 24hrs if the amount of stool continues to be greater than 1,000 mLs/day increase the imodium to 2 tablets (4 mg) of imodium, 3 times daily and call your healthcare provider.   - Again after 24hrs, if the amount of stool remains greater than 1,000 mLs/day, increase the imodium to 2 tablets (4 mg) of imodium, 4 times daily and call your healthcare provider.     **If you were started on imodium in the hospital, continue taking imodium at the dose you were prescribed. If you are having >1,000 mLs of stool/day on your current dose, follow the steps above.         BATHING: You may shower, but do not submerge wounds (eg bathtub, swimming pool) until cleared to do so at your follow-up appointment.    ACTIVITY: No lifting greater than 10 pounds or strenuous activity until seen by the doctor in your follow-up visit.    PAIN MEDICATION:   1. You SHOULD take 650 mg Tylenol every 8 hours as needed in addition to, or instead of, narcotic pain medication. This will help you decrease your need for narcotics. Do NOT exceed 3,000 mg Tylenol in 24 hours.   2. You SHOULD take Advil/Motrin/ibupfrofen (400-600 mg every 6 hours as needed) products in addition to, or instead of, narcotic pain medication. This will help you decrease your need for narcotics. Take with food.   3. You may purchase over the counter lidocaine patches (2 or 4%) to help with localized incisional pain. Use these according to package directions.  4. Do not drive or drink alcohol while taking pain medication.   5. Take your prescribed narcotic pain medication only as needed.   6. You should decrease the amount of narcotic pain medication as soon as possible.  7. Narcotic pain medication can cause constipation. To avoid this, you should take an over the counter stool softener (Colace). Some patients will also require a laxative, which you may also find available over the counter (MiraLax, Senna, Dulcolax, magesium citrate).    Please note that prescription pain medication refills will not be called into your pharmacy. If you feel that you are needing more pain medication, you will need to be seen in clinic or the Emergency Department for further evaluation to ensure you are not experiencing a complication. Please take note of how many pills you have left in your bottle, and if you are starting to run low and feel that you will need more for adequate pain control, call the clinic as soon as possible to schedule an appointment. As our clinics are very busy, and we cannot guarantee same week appointments.      WHEN TO CALL THE SURGEON'S OFFICE:  1. Thick, yellow drainage from your incisions, redness at incisions, bleeding, or separations of wounds.   2. Temperature greater than 101.5  3. Uncontrolled nausea or vomiting.  4. Pain that is not controlled by your pain medication.  5. Inability to have a bowel movement for 3 or more consecutive days.  6. Jaundice (yellow eyes or skin).  7. Any new or concerning symptoms.  If you have problems or questions after discharge, please contact the following:     During business hours (Monday-Friday 8am-5pm): Contact the [facility] Triage Line at [phone].     Alternatively, you may leave a message with your surgeon's nurse practitioner. For Dr. Hiram Comber, Dr. Donalynn Furlong, and Dr. Milderd Meager you may reach Lonia Farber, NP at [phone]; for Dr. Daryl Eastern, Dr. Tora Duck, or Dr. Margarette Asal you may reach Sedalia Muta, NP at [phone]; or, Boykin Nearing, NP at [email or web address] Please be aware that messages will be returned as soon as possible, but these voicemails/emails are only monitored Monday - Friday 8am -5pm.     For emergencies after-hours (after 5pm or weekends): call the [facility] operator [phone] and ask to page the General Surgery resident on call. You will be directed to a surgery resident who likely is not immediately aware of the details of your case, but can help you deal with any emergencies that cannot wait until regular business hours.  Please be aware that this person is responding to many in-hospital emergencies and patient issues and may not answer your phone call immediately, but will return your call as soon as possible.    Questions regarding clinic appointments: [phone]        [facility] Patient Resource Center    The Patient & Family Resource Center is located in the lobby of the [facility] next to the Outpatient Pharmacy.  We offer a variety of support services to help as you and your caregivers continue to navigate your cancer treatment journey. We can connect you with social work, support groups, financial assistance, counseling, relaxation spaces, hair-loss boutique services, and much more.  You are welcome to call us at [phone] or stop in Monday-Friday 7:30am-4pm.    Discharge instructions            Labs and Other Follow-ups after Discharge:  Follow Up instructions and Outpatient Referrals     Discharge instructions      Discharge instructions        Resources and Referrals       Ostomy Supplies Length of Need: 2 months    Loop ileostomy   Hollister (11703) 2 piece wafer convex extended wear (red soft); 20 per month  Hollister (18003) 2 piece pouch with flange drainable; 20 per month     Accessories  Coloplast [phone]) barrier ring, 20 per month  Convatec (381017) adhesive remover wipe, 50 per month  Hollister (7300) ostomy belt small, 1 per month  Hollister (7906) stoma powder, 10 ounces every 6 months  62M (3346) skin barrier spray, 2 ounces per month        Loop ileostomy   Hollister (11703) 2 piece wafer convex extended wear (red soft); 20 per month  Hollister (18003) 2 piece pouch with flange drainable; 20 per month     Accessories  Coloplast [phone]) barrier ring, 20 per month  Convatec (527782) adhesive remover wipe, 50 per month  Hollister (7300) ostomy belt small, 1 per month  Hollister (7906) stoma powder, 10 ounces every 6 months  62M (3346) skin barrier spray, 2 ounces per month    Ostomy Supplies      Length of Need: 2 months    Hollister (11703) 2 piece wafer convex extended wear (red soft); 20 per month  Hollister (18003) 2 piece pouch with flange drainable; 20 per month     Accessories  Coloplast [phone]) barrier ring, 20  per month  Convatec [phone]) adhesive remover wipe, 50 per month  Coloplast [phone]) elastic strip, 40 per month  Hollister (7300) ostomy belt small, 1 per month  Hollister [phone]) stoma powder, 10 ounces every 6 months  59M (3346) skin barrier spray, 2 ounces per month        Hollister (11703) 2 piece wafer convex extended wear (red soft); 20 per month  Hollister (18003) 2 piece pouch with flange drainable; 20 per month     Accessories  Coloplast [phone]) barrier ring, 20 per month  Convatec (562130) adhesive remover wipe, 50 per month  Coloplast [phone]) elastic strip, 40 per month  Hollister (7300) ostomy belt small, 1 per month  Hollister [phone]) stoma powder, 10 ounces every 6 months  59M (3346) skin barrier spray, 2 ounces per month        [facility] at [phone] ostomy supplies             Future Appointments:  Appointments which have been scheduled for you      Mar 11, 2022 11:30 AM  (Arrive by 11:00 AM)  Danelle Earthly POST OP with Marion Downer, MD  [facility] SURGERY ONCOLOGY [facility] REGION) [address]  [place] [address]  [phone]        Mar 11, 2022 12:30 PM  (Arrive by 12:00 PM)  OSTOMY with [facility] SURG ONC WOCN  [facility] SURGERY ONCOLOGY [facility] REGION) [address]  [place] [address]  [phone]

## 2022-02-23 NOTE — Unmapped (Signed)
VS stable, no fever as noted. Continued on regular diet, tolerating well. No report of nausea as noted. Ostomy remained intact with formed stool noted. Voiding adequately. Pain manage with 5mg  Oxycodone. ML incision CDI. C/o bloating, given gas ex per pt's request. WOCN teaching on going. Supplies ordered today, delivered to bedside. Will continue to follow POC.   Problem: Latex Allergy  Goal: Absence of Allergy Symptoms  Outcome: Progressing     Problem: Wound  Goal: Optimal Coping  Outcome: Progressing  Goal: Optimal Functional Ability  Outcome: Progressing  Goal: Absence of Infection Signs and Symptoms  Outcome: Progressing  Goal: Improved Oral Intake  Outcome: Progressing  Goal: Optimal Pain Control and Function  Outcome: Progressing  Goal: Skin Health and Integrity  Outcome: Progressing  Goal: Optimal Wound Healing  Outcome: Progressing     Problem: Adult Inpatient Plan of Care  Goal: Plan of Care Review  Outcome: Progressing  Goal: Patient-Specific Goal (Individualized)  Outcome: Progressing  Goal: Absence of Hospital-Acquired Illness or Injury  Outcome: Progressing  Goal: Optimal Comfort and Wellbeing  Outcome: Progressing  Goal: Readiness for Transition of Care  Outcome: Progressing  Goal: Rounds/Family Conference  Outcome: Progressing     Problem: Self-Care Deficit  Goal: Improved Ability to Complete Activities of Daily Living  Outcome: Progressing     Problem: Fall Injury Risk  Goal: Absence of Fall and Fall-Related Injury  Outcome: Progressing     Problem: Skin Injury Risk Increased  Goal: Skin Health and Integrity  Outcome: Progressing     Problem: Malnutrition  Goal: Improved Nutritional Intake  Outcome: Progressing

## 2022-02-23 NOTE — Unmapped (Signed)
Updated on POC. VSS. Denies nausea. Pain medication given with good results. Wound CDI. Ambulates with assistance. Ostomy intact.    Problem: Latex Allergy  Goal: Absence of Allergy Symptoms  Outcome: Ongoing - Unchanged     Problem: Wound  Goal: Optimal Coping  Outcome: Ongoing - Unchanged  Goal: Optimal Functional Ability  Outcome: Ongoing - Unchanged  Goal: Absence of Infection Signs and Symptoms  Outcome: Ongoing - Unchanged  Goal: Improved Oral Intake  Outcome: Ongoing - Unchanged  Goal: Optimal Pain Control and Function  Outcome: Ongoing - Unchanged  Goal: Skin Health and Integrity  Outcome: Ongoing - Unchanged  Goal: Optimal Wound Healing  Outcome: Ongoing - Unchanged     Problem: Adult Inpatient Plan of Care  Goal: Plan of Care Review  Outcome: Ongoing - Unchanged  Goal: Patient-Specific Goal (Individualized)  Outcome: Ongoing - Unchanged  Goal: Absence of Hospital-Acquired Illness or Injury  Outcome: Ongoing - Unchanged  Goal: Optimal Comfort and Wellbeing  Outcome: Ongoing - Unchanged  Goal: Readiness for Transition of Care  Outcome: Ongoing - Unchanged  Goal: Rounds/Family Conference  Outcome: Ongoing - Unchanged     Problem: Self-Care Deficit  Goal: Improved Ability to Complete Activities of Daily Living  Outcome: Ongoing - Unchanged     Problem: Fall Injury Risk  Goal: Absence of Fall and Fall-Related Injury  Outcome: Ongoing - Unchanged     Problem: Skin Injury Risk Increased  Goal: Skin Health and Integrity  Outcome: Ongoing - Unchanged     Problem: Malnutrition  Goal: Improved Nutritional Intake  Outcome: Ongoing - Unchanged

## 2022-02-23 NOTE — Unmapped (Signed)
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 Per RN, patient is being discharged today. 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 She underwent an exploratory laparotomy, low anterior resection with diverting loop ileostomy, TAH and BSO, splenectomy, cholecystectomy, and appendectomy and received cisplatin intraoperatively.   Patient assessed on [facility].   She describes the medial aspect closest to her midline incision as the trouble spot for leakage. We removed the pouch together and I agree with her concern. Her abdomen has a noticeable dip at that location and the stool is leaking outside the tape border. Provided her with an option of covering the well approximated incision with a hydrocolloid based pouch extender to cover and protect the incision, which is very important to her. Also with the soft convex wafer,  her stoma size is at max cut surface and adding ostomy belt support is advantageous to providing the pouch with the support it needs to maintain the seal. She is in agreement. I assisted her with the placement of the pouch extender and demonstrated the application of the moldable barrier ring to wrap around the stoma for enhanced seal. Belt applied.          Stoma Type:  -  Ileostomy Stoma Location:  - RLQ (Right Lower Quadrant)     Stoma Characteristics:  - Round  - Budded  - Diameter 1-1/2 Stoma Mucosal Condition and Color:  - Moist  - Edematous  - Red     Mucocutaneous Junction:  - Intact    Output:  - Brown  - Pasty  - Effluent     Peristomal Skin Condition:   - Intact    Abdominal Contours:  - Soft  - Loose skin  - Creasing at 3 o'clock    Pouching System:  - 2 Piece  - Convex  - CTF (Cut to fit)  - Moldable barrier ring  - Ostomy belt Anticipated Wear Time of Pouching System:  - 3 to 5 days  - To be determined     Teaching Limitations/Considerations:   - none noted    Teaching/Instructions:  Reviewed and demonstrated use of the pouch extender barrier strip to apply over her incision prior to applying the pouch  Reviewed and demonstrated applying the moldable barrier ring by wrapping it around the stoma  Reviewed and demonstrated use of ostomy belt support     Recommendations/Plan:   - Final discharge ostomy supply list.  - Patient has met ostomy teaching goals.  - Patient to follow up with outpatient WOC nurse.  Continue ostomy belt support.   Continue covering the incision with the barrier strips.   Please contact ostomy nurse line if any other concerns arise prior to follow up appointment.   Please schedule a follow up ostomy resource appointment with her post op appointment    Ostomy Discharge Goals:  - Patient/family member able to empty pouch independently.  - Patient/family member able to change pouch independently.  - Patient/family member able to identify ostomy supplies to take home at discharge and knowledge of supply order process.  Recommended Consults:   - Not Applicable Plan of Care Discussed With:  - Patient  - Family  - RN primary  - LIP Dr. Hyacinth Meeker, Dr. Cline Cools   - CCM Laural Benes     Ostomy Supplies:   - Take home supplies at the bedside.    Outpatient Supply List   30 day supply     Pouching System   [employer]ister [phone]) 2 piece wafer convex extended wear (red soft); 20 per month  [employer]ister (18003) 2 piece pouch with flange drainable; 20 per month    Accessories  Coloplast [phone]) barrier ring, 20 per month  Convatec (213086) adhesive remover wipe, 50 per month  [employer]oplast [phone]) elastic strip, 40 per month  [employer]ister (7300) ostomy belt small, 1 per month  Hollister [phone]) stoma powder, 10 ounces every 6 months  73M (3346) skin barrier spray, 2 ounces per month    WOCN Follow-up:  - We will sign off at this time    Workup Time:  45 minutes    Reconsult for questions/concerns/deterioration.   Available by Epic Chat   Renaye Rakers RN, BSN, CWOCN

## 2022-02-23 NOTE — Unmapped (Signed)
[facility] at [phone] ostomy supplies

## 2022-02-23 NOTE — Unmapped (Signed)
GYN ONC PROGRESS NOTE    ASSESSMENT AND PLAN     Hospital Day: 9    Debra Ponce is a 52 y.o. who is POD#8 s/p EUA, exploratory laparotomy, TAH w/ radical dissection, b/l ureterolysis, BSO, posterior cul-de-sac peritonectomy, tumor debulking (Gyn Onc); diagnostic laparoscopy, LAR w primary end-to-end anastomosis, enterolysis, cholecystectomy, tumor capsule tumor debulking, splenectomy, appendectomy, omentectomy, diverting loop ileostomy formation with HIPEC (Surg Onc) on 02/15/21 for Stage IIIC mixed germ cell tumor. Patient is surgical oncology primary. Doing well postoperatively. Anticipate discharge this week.     ONC: Stage IIIC mixed germ cell tumor  - Primary Oncologist: Dr. Pricilla Holm  - Last treatment: Bleomycin, Etoposide, Cisplatin on 11/28 (s/p 2 cycles)  - Last CA-125: 469  - S/p above procedure: final path consistent with malignant germ cell tumor  - Patient has a follow up appointment with Dr. Pricilla Holm in [place] on 1/19 at 0830. Patient would like to reschedule this appointment. We will attempt to reschedule it for her.     Postoperative Care:  - H/H: 8.1 > EBL 1000 mL > 3u pRBCs intra-op >> POD#6 s/p 1u pRBCs >> 11.2 this AM. Recommend goal Hgb > 8 given anastomoses and plans for future treatment   - Please give IV iron today prior to discharge  - ERAS protocol for pain: sch tylenol, PRN Dilaudid. Epidural removed on 1/8. Working on optimizing PO pain regimen  - On RA  - Medlocked  - NGT (1/2-1/5). Tolerating a regular diet   - Foley removed, urinating without difficulty   - S/p splenectomy. Will need post splenectomy vaccines prior to discharge   - WOCN following for ostomy teaching    Chronic Medical Conditions  - GAD: Home prozac 10 mg, ativan PRN held  - Sleep disturbance: Atarax nightly    PPX: SCDs, heparin, IS, ambulation. Recommend discharging on prophylactic Eliquis (was on this medication prior to surgery)    Dispo: Floor    Plan discussed with Dr. Jonny Ruiz and Attending Dr. Donnald Garre.     SUBJECTIVE NAEON. Patient is feeling well this morning. Her pain is well-controlled on her current regimen. She is tolerating a regular diet without nausea or vomiting. She is ambulating without difficulty. Looking forward to discharge today.     OBJECTIVE     Temp:  [36.5 ??C (97.7 ??F)-37.1 ??C (98.8 ??F)] 36.5 ??C (97.7 ??F)  Heart Rate:  [79-110] 79  Resp:  [16-18] 16  BP: (110-127)/(77-90) 110/86  SpO2:  [97 %-98 %] 97 %    Gen: NAD, awake, alert, oriented  CV: Regular rate and rhythm   Pulm: Clear to auscultation bilaterally, normal work of breathing  Abd: Soft, appropriately tender to palpation, non-distended, bowel sounds present; Ostomy with healthy appearing stoma, thin brown, gas output in bag. JP drains removed  Incision: midline vertical incision clean, dry, intact closed with suture, with no surrounding erythema or induration  GU: Deferred      Intake/Output Summary (Last 24 hours) at 02/23/2022 0823  Last data filed at 02/23/2022 0809  Gross per 24 hour   Intake 600 ml   Output 3800 ml   Net -3200 ml       Medications (scheduled)    acetaminophen  650 mg Oral Q6H    enoxaparin (LOVENOX) injection  40 mg Subcutaneous Q24H SCH    famotidine (PEPCID) IV  20 mg Intravenous BID    lidocaine  1 patch Transdermal Daily    pregabalin  25 mg Oral Nightly  Medications (prn)  dextrose in water, glucagon, HYDROmorphone **OR** [DISCONTINUED] HYDROmorphone, hydrOXYzine, naloxone, ondansetron, oxyCODONE **OR** oxyCODONE, phenol, prochlorperazine (COMPAZINE) 5 mg in sodium chloride (NS) 0.9 % 25 mL IVPB, simethicone    Labs:  Lab Results   Component Value Date    WBC 18.4 (H) 02/23/2022    HGB 11.2 (L) 02/23/2022    HCT 34.5 02/23/2022    PLT 634 (H) 02/23/2022       Lab Results   Component Value Date    NA 137 02/23/2022    K 4.4 02/23/2022    CL 104 02/23/2022    CO2 29.0 02/23/2022    BUN 8 (L) 02/23/2022    CREATININE 0.44 (L) 02/23/2022    GLU 98 02/23/2022    CALCIUM 8.8 02/23/2022    MG 1.5 (L) 02/23/2022    PHOS 3.3 02/23/2022       Lab Results   Component Value Date    BILITOT 0.3 02/20/2022    BILIDIR 0.30 01/10/2022    PROT 4.4 (L) 02/20/2022    ALBUMIN 1.8 (L) 02/20/2022    ALT 13 02/20/2022    AST 17 02/20/2022    ALKPHOS 149 (H) 02/20/2022       Lab Results   Component Value Date    INR 1.42 01/09/2022    APTT 33.4 01/09/2022     Scribe's Attestation: Yancey Flemings, MD obtained and performed the history, physical exam and medical decision making elements that were  entered into the chart. Documentation assistance was provided by me personally, a scribe. Signed by Marylin Crosby Scribe, on February 23, 2022 at 1:40 AM.     ----------------------------------------------------------------------------------------------------------------------  February 23, 2022 8:23 AM. Documentation assistance provided by the Scribe. I was present during the time the encounter was recorded. The information recorded by the Scribe was done at my direction and has been reviewed and validated by me.  ----------------------------------------------------------------------------------------------------------------------

## 2022-02-24 MED ADMIN — heparin, porcine (PF) 100 unit/mL injection 500 Units: 500 [IU] | INTRAVENOUS | @ 01:00:00 | Stop: 2022-02-23

## 2022-03-01 ENCOUNTER — Telehealth: Payer: Self-pay | Admitting: Oncology

## 2022-03-01 ENCOUNTER — Other Ambulatory Visit: Payer: Self-pay | Admitting: Gynecologic Oncology

## 2022-03-01 ENCOUNTER — Telehealth: Payer: Self-pay | Admitting: *Deleted

## 2022-03-01 DIAGNOSIS — Z933 Colostomy status: Secondary | ICD-10-CM

## 2022-03-01 NOTE — Unmapped (Signed)
Hi,     Kerri-Anne Heagney contacted the [employer] requesting to speak with the care team of Catarina Tigue to discuss:    -that she discharged a week ago after surgery and she has an ostomy bag that the Case Manager was to order supplies for. Patient noted that she has not received any supplies and she is concerned that she is getting low.     Please contact Colleen  at [phone].      Thank you,   Durward Fortes  [facility]   [phone]

## 2022-03-01 NOTE — Unmapped (Signed)
Called patient's DME company, [employer]. The company had no record of her DME order that was faxed/called in by the inpatient case managers on 02/23/2022.     I refaxed the request for DME (OP note, WOCN note, supply list, and demographic facesheet) to the number an [employer] representative provided. Received confirmation of successful fax. [employer] rep informed me of 48 hour processing time. I will follow up on Debra Ponce's order in the coming days.     Unfortunately, the patient only has 2 pouches left. Suggested calling [employer] for free samples, going to local DME supply store and paying out of pocket. If neither of those options work out for the patient, I will make her a WOCN appointment this week at [facility] for evaluation and supplies to tide her over.     Patient agreed to plan and will reach out with any further questions or concerns.

## 2022-03-01 NOTE — Unmapped (Signed)
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 She underwent an exploratory laparotomy, low anterior resection with diverting loop ileostomy, TAH and BSO, splenectomy, cholecystectomy, and appendectomy and received IP cisplatin intraoperatively.     CWOCN received phone call from pt stating that she has not yet received home ostomy supplies.     Per EMR, pt was [facility] up with [facility] prior to discharge. I called [facility] and they do not have record of pt.     I reached at to Debra Party RN navigator to facilitate pt getting set up with supply comapnay.     I spoke with Debra Ponce who states she having to change her pouch daily to every other day due to leakage at distal skin crease.     She states that the ostomy belt is uncomfortable and has been wearing it.     I advised her to use a small piece of barrier ring and fill in crease to help even out her pouching service.     I also gave her the number to [facility] secure start to hopefully send her some pouching samples to help bridge the gap until her supply company is set up.             Stoma Type:  -  Ileostomy Stoma Location:  - RLQ (Right Lower Quadrant)            Pouching System:  - 2 Piece  - Convex  - CTF (Cut to fit)  - Moldable barrier ring  - Ostomy belt Anticipated Wear Time of Pouching System:  - 3 to 5 days  -      Teaching Limitations/Considerations:   - none noted     Teaching/Instructions:  -Discussed applying barrier ring to creasing       Recommended Consults:   - Not Applicable Plan of Care Discussed With:  - Patient  - Debra Party RN         Outpatient Supply List   30 day supply      Pouching System   [employer]ollister [phone]) 2 piece wafer convex extended wear (red soft); 20 per month  Hollister (18003) 2 piece pouch with flange drainable; 20 per month     Accessories  Coloplast [phone]) barrier ring, 20 per month  Convatec (621308) adhesive remover wipe, 50 per month  Coloplast (120700) elastic strip, 40 per month  Hollister (7300) ostomy belt small, 1 per month  Hollister (7906) stoma powder, 10 ounces every 6 months  61M (3346) skin barrier spray, 2 ounces per month     WOCN Follow-up:  - As needed     Workup Time:  45 minutes     Donalynn Furlong RN, BSN, [employer] Consult Team  P: [phone]

## 2022-03-01 NOTE — Telephone Encounter (Signed)
Left a message for the ostomy clinic regarding referral.  Requested a return call.

## 2022-03-01 NOTE — Telephone Encounter (Signed)
Patient called and stated "I had surgery at [facility] and have an ostomy. [facility] was suppose to set me up with supplies; but I haven't received any. I called [facility] and they are having a hard time getting the supplies or helping me. [facility] suggested that I call the [place] office and have you all help me. Plus I will need some supplies to get me through until I receive the shipment." Explained that the message would be given to [facility] APP/Dr Berline Lopes and the office will either call her back todauy or talk to her tomorrow at her appt.

## 2022-03-02 ENCOUNTER — Other Ambulatory Visit (HOSPITAL_COMMUNITY): Payer: Self-pay

## 2022-03-02 ENCOUNTER — Inpatient Hospital Stay: Payer: 59

## 2022-03-02 ENCOUNTER — Encounter: Payer: Self-pay | Admitting: Hematology and Oncology

## 2022-03-02 ENCOUNTER — Telehealth: Payer: Self-pay | Admitting: *Deleted

## 2022-03-02 ENCOUNTER — Inpatient Hospital Stay: Payer: 59 | Attending: Gynecologic Oncology | Admitting: Gynecologic Oncology

## 2022-03-02 ENCOUNTER — Encounter: Payer: Self-pay | Admitting: Gynecologic Oncology

## 2022-03-02 ENCOUNTER — Other Ambulatory Visit: Payer: Self-pay

## 2022-03-02 VITALS — BP 122/71 | HR 109 | Resp 14 | Ht 59.0 in | Wt 94.8 lb

## 2022-03-02 DIAGNOSIS — D75838 Other thrombocytosis: Secondary | ICD-10-CM | POA: Diagnosis not present

## 2022-03-02 DIAGNOSIS — C563 Malignant neoplasm of bilateral ovaries: Secondary | ICD-10-CM | POA: Diagnosis present

## 2022-03-02 DIAGNOSIS — Z932 Ileostomy status: Secondary | ICD-10-CM | POA: Insufficient documentation

## 2022-03-02 DIAGNOSIS — E860 Dehydration: Secondary | ICD-10-CM | POA: Insufficient documentation

## 2022-03-02 DIAGNOSIS — Z90710 Acquired absence of both cervix and uterus: Secondary | ICD-10-CM | POA: Diagnosis not present

## 2022-03-02 DIAGNOSIS — H9190 Unspecified hearing loss, unspecified ear: Secondary | ICD-10-CM | POA: Insufficient documentation

## 2022-03-02 DIAGNOSIS — C787 Secondary malignant neoplasm of liver and intrahepatic bile duct: Secondary | ICD-10-CM | POA: Diagnosis not present

## 2022-03-02 DIAGNOSIS — Z5111 Encounter for antineoplastic chemotherapy: Secondary | ICD-10-CM | POA: Insufficient documentation

## 2022-03-02 DIAGNOSIS — Z90722 Acquired absence of ovaries, bilateral: Secondary | ICD-10-CM | POA: Diagnosis not present

## 2022-03-02 DIAGNOSIS — E46 Unspecified protein-calorie malnutrition: Secondary | ICD-10-CM | POA: Diagnosis not present

## 2022-03-02 DIAGNOSIS — R000 Tachycardia, unspecified: Secondary | ICD-10-CM | POA: Diagnosis not present

## 2022-03-02 DIAGNOSIS — D61818 Other pancytopenia: Secondary | ICD-10-CM | POA: Diagnosis not present

## 2022-03-02 DIAGNOSIS — G893 Neoplasm related pain (acute) (chronic): Secondary | ICD-10-CM | POA: Diagnosis not present

## 2022-03-02 DIAGNOSIS — C569 Malignant neoplasm of unspecified ovary: Secondary | ICD-10-CM

## 2022-03-02 DIAGNOSIS — G8918 Other acute postprocedural pain: Secondary | ICD-10-CM

## 2022-03-02 DIAGNOSIS — Z7189 Other specified counseling: Secondary | ICD-10-CM

## 2022-03-02 LAB — CMP (CANCER CENTER ONLY)
ALT: 48 U/L — ABNORMAL HIGH (ref 0–44)
AST: 33 U/L (ref 15–41)
Albumin: 3.4 g/dL — ABNORMAL LOW (ref 3.5–5.0)
Alkaline Phosphatase: 98 U/L (ref 38–126)
Anion gap: 6 (ref 5–15)
BUN: 12 mg/dL (ref 6–20)
CO2: 31 mmol/L (ref 22–32)
Calcium: 9.6 mg/dL (ref 8.9–10.3)
Chloride: 102 mmol/L (ref 98–111)
Creatinine: 0.46 mg/dL (ref 0.44–1.00)
GFR, Estimated: >60 mL/min (ref >60)
Glucose, Bld: 79 mg/dL (ref 70–99)
Potassium: 4.1 mmol/L (ref 3.5–5.1)
Sodium: 139 mmol/L (ref 135–145)
Total Bilirubin: 0.3 mg/dL (ref 0.3–1.2)
Total Protein: 6.4 g/dL — ABNORMAL LOW (ref 6.5–8.1)

## 2022-03-02 MED ORDER — TRAMADOL HCL 50 MG PO TABS
50.0000 mg | ORAL_TABLET | Freq: Four times a day (QID) | ORAL | 0 refills | Status: DC | PRN
  Filled 2022-03-02: qty 30, 8d supply, fill #0

## 2022-03-02 NOTE — Telephone Encounter (Signed)
[employer] [phone], Fax: [phone]) faxed request for office notes with physical examination findings for [name] claim: [id number] for the period 01/03/2022 through 03/02/2022 to be returned to fax number [phone] by 03/07/2022.  [place] request faxed to (SW) [employer] [phone].  No form received yet additional information requested with records request.   Routed to collaborative for provider review for orders and/or disability information for "[employer] asks to "let them know if [name] would be able to go back to work with restrictions in place (hourly or physical).  Their employer may be able to work with your recommendations and help [name] get back to work sooner."

## 2022-03-02 NOTE — Progress Notes (Signed)
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 21.1 by 10.9 by 18.7 cm abdominopelvic mass, with a satellite mass in the right upper quadrant anteriorly, and moderate ascites with tumor deposits along the pelvic ascites posteriorly. The mass could be arising from right or left ovary, or both. The mass displaces the bowel and IVC, but there is no IVC thrombus or pelvic DVT identified. 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 2. Additional satellite mass above the liver is partially included, along with tumor deposits along the ascites. 3. Low-level edema along the lateral abdominal wall musculature. 4. Flattening of the IVC at the level of the aortic bifurcation due to the large abdominopelvic tumor. No findings of pelvic DVT.     12/07/2021 Imaging   CT abdomen and pelvis 1. Large central abdominal and pelvic mass consistent with known malignancy. This is incompletely visualized by this CT of the abdomen, but was seen on recent pelvic MRI. 2. 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 Right omental implant consistent with metastatic disease. Small amount of abdominal ascites. 3. No evidence for bowel or ureteral obstruction. 4. Aortic Atherosclerosis (ICD10-I70.0). 5. Chest findings dictated separately.   12/07/2021 Imaging   CT chest 1. Negative for pulmonary embolism. And no acute or metastatic process identified in the Chest. 2. Partially visible liver mass and ascites, staging CT Abdomen today is reported separately.     12/07/2021 Procedure   Successful placement of a right  internal jugular approach power injectable Port-A-Cath. The catheter is ready for immediate use.   12/07/2021 Procedure   Ultrasound-guided biopsy of right upper quadrant mass.    12/08/2021 Initial Diagnosis   Ovarian cancer ([place])   12/08/2021 Cancer Staging   Staging form: Ovary, Fallopian Tube, and Primary Peritoneal Carcinoma, AJCC 8th Edition - Clinical stage from 12/08/2021: FIGO Stage IIIC (cT3c, cN0, cM0) - Signed by Heath Lark, MD on 12/10/2021 Stage prefix: Initial diagnosis   12/20/2021 - 12/28/2021 Chemotherapy   Patient is on Treatment Plan : Ovarian germ cell tumor q21d x 3 Cycles     01/03/2022 Imaging   IMPRESSION: 1. Large central abdominopelvic mass compatible with malignancy, 3450 cubic cm in volume. This may be arising from the ovaries with slight eccentricity to the right in the pelvis. 2. Mild increase in size of the right omental tumor deposit. 3. Mild increase in size of the subcapsular mass along the dome of the right hepatic lobe, with suspected hepatic invasion. 4. Scattered malignant ascites. 5. No dilated bowel to suggest obstruction. 6. 2 mm left kidney lower pole nonobstructive renal calculus. 7. Mild abdominal aortic atherosclerotic vascular disease.   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  Postoperative Diagnosis: Status post R0 cytoreductive surgery and heating intraperitoneal chemotherapy  Procedures: LAPAROSCOPY, ABDOMEN, PERITONEUM, & OMENTUM, DIAGNOSTIC, W/WO COLLECTION SPECIMEN(S) BY BRUSHING OR WASHING  EXCISION/DESTRUCTION, OPEN, INTRA-ABD TUMOR/CYST/ENDOMETRIOMA, 1+ PERITONEAL/MESENTERI/RETROPERIT 5CM OR <COLECTOMY, PARTIAL; WITH ANASTOMOSIS COLOSTOMY OR SKIN LEVEL CECOSTOMY SPLENECTOMY; TOT (SEPART PROC) EXPLORATORY LAPAROTOMY, EXPLORATORY CELIOTOMY WITH OR WITHOUT BIOPSY(S) HYPERTHERMIA, EXTERNALLY GENERATED; DEEP CHEMOTHERAPY  ADMINISTRATION INTO  THE PERITONEAL CAVITY VIA INDWELLING PORT OR CATHETER RESECTION (INITIAL) OVARIAN, TUBAL/PRIM PERITONEAL MALIG W/BIL S&O/OMENTECT; W/RAD DISSECTION FOR DEBULKING CHOLECYSTECTOMY APPENDECTOMY OMENTECTOMY, EPIPLOECTOMY, RESECTION OF OMENTUM Procedures: Exploratory laparotomy, total abdominal hysterectomy with radical dissection, bilateral ureterolysis, bilateral salpingo-oophorectomy, posterior cul-de-sac peritonectomy, tumor debulking by Dr Berline Lopes with Gynecology Oncology Dr [place] Scrape with surgical oncology: Diagnostic laparoscopy, exploratory laparotomy, low anterior resection with primary end-to-end anastomosis, enterolysis, cholecystectomy, tumor capsule tumor debulking, splenectomy, appendectomy, omentectomy, diverting loop ileostomy formation  Surgeon: Jeral Pinch, MD  Findings: On diagnostic laparoscopy, only able to clearly visualize the left upper quadrant. Adhesions of bowel in the right upper quadrant limiting survey, minimal ascites noted in the pelvis, scattered tumor plaques visualized on left diaphragm. Given these findings, the decision was made to convert to exploratory laparotomy. Multiple loops of small bowel encased in a Cancn like structure in the midline with multiple filmy adhesions tethering to the superior aspect of the a large pelvic mass. Multicystic and necrotic pelvic mass originating from left ovary, approximately 18 x 20 cm incorporated within the left broad ligament with the left tube draped overlying the left ovarian mass. Bilateral tubes normal in appearance, normal-appearing uterus with smooth serosa. Right adnexa similarly with approximate 10 x 8 multicystic and necrotic mass. Diffuse tumor plaques encompassing posterior cul-de-sac peritoneum. Rectosigmoid densely adhered to posterior aspect of left adnexal mass requiring dissection and ultimately resection. Scattered treated subcentimeter nodules along small bowel mesentery. 5 x 5 cm necrotic and multicystic tumor at  the level of the hepatic flexure. Enlarged similar multicystic necrotic tumor adherent between the right diaphragm and the liver without any evidence of liver invasion and tumor encased solely within the liver capsule. Multiple tumor nodules along the spleen requiring splenectomy. Omentum overall fairly normal in appearance with only scattered small tumor deposits, no omental cake was encountered. Please see surgical oncology operative note for further details regarding operative findings for their portion of the procedure.  Specimens: ID Type Source Tests Collected by Time Destination 1 : Abdominal fat pad Tissue Abdomen SURGICAL PATHOLOGY EXAM Marthann Schiller, MD 02/15/2022 [phone] 2 : Left adnexa Tissue Abdomen SURGICAL PATHOLOGY EXAM Marthann Schiller, MD 02/15/2022 [phone] : right adnexa Tissue Abdomen SURGICAL PATHOLOGY EXAM Marthann Schiller, MD 02/15/2022 [phone] : Left perimetrium Tissue Uterus SURGICAL PATHOLOGY EXAM Lafonda Mosses, MD 02/15/2022 [phone] 5 : Uterus and cervix Tissue Uterus SURGICAL PATHOLOGY EXAM Marthann Schiller, MD 02/15/2022 1018 6 : Low Anterior Resection Tissue Abdomen SURGICAL PATHOLOGY EXAM Marthann Schiller, MD 02/15/2022 1055 7 : Right peritoneum Tissue Abdomen SURGICAL PATHOLOGY EXAM Marthann Schiller, MD 02/15/2022 1058 8 : Right retroperitoneal mass Tissue Abdomen SURGICAL PATHOLOGY EXAM Marthann Schiller, MD 02/15/2022 1101 9 : Omentum Tissue Omentum SURGICAL PATHOLOGY EXAM Marthann Schiller, MD 02/15/2022 1130 10 : Gallbladder Tissue Gallbladder SURGICAL PATHOLOGY EXAM Marthann Schiller, MD 02/15/2022 1143 11 : Right diaphragm Tissue Diaphragm SURGICAL PATHOLOGY EXAM Marthann Schiller, MD 02/15/2022 1143 12 : Left diaphragm nodule Tissue Diaphragm SURGICAL PATHOLOGY EXAM Marthann Schiller, MD 02/15/2022 1157 13 : Spleen Tissue Spleen SURGICAL PATHOLOGY EXAM Marthann Schiller, MD 02/15/2022 1204 14 :  Appendix Tissue Appendix SURGICAL PATHOLOGY EXAM Marthann Schiller, MD 02/15/2022 1213 15 : Small bowel mesenteric nodules Tissue Small Bowel SURGICAL PATHOLOGY EXAM Marthann Schiller, MD 02/15/2022 1215 16 : Colon mesenteric nodule Tissue Colon SURGICAL PATHOLOGY EXAM Marthann Schiller, MD 02/15/2022 1221 17 : Hepatic flexure mass Tissue Liver SURGICAL PATHOLOGY EXAM  Marthann Schiller, MD 08/21/4126 [id number]  Complications: None  Indications for Procedure: Michalene Debruler is a 52 y.o. 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 Prior to the procedure, all risks, benefits, and alternatives were discussed and informed surgical consent was signed.  Procedure: Patient was taken to the operating room where general anesthesia was achieved. She was positioned in dorsal lithotomy and prepped and draped per the surgical oncology team. A foley catheter was inserted into the bladder. A 5 mm skin incision was made in the left upper quadrant at Palmer's point, the abdomen was then entered with direct Optiview entry and the abdomen was insufflated. Findings were noted as above.  The laparoscope was then removed from the abdomen, the pneumoperitoneum was maintained. A vertical midline incision was made with the scalpel and the abdomen was entered sharply. The abdomen and pelvis were surveyed with findings as documented above. A wound protector was placed followed by the [facility] retractor.  The enlarged left adnexal mass was then carefully removed of its bilateral sidewall attachments using a mixture of blunt and cautery dissection. Multiple small bowel loops were adhered to the superior and posterior aspect of the mass with filmy adhesions which were carefully bluntly resected. The rectosigmoid was noted to be densely adhered to the posterior aspect of the pelvic mass. Dissection was attempted using mixture of  blunt dissection as well as sharp dissection with the Metzenbaum scissors, and unavoidable enterotomy of approximately 4 cm was made to the anterior surface of the rectosigmoid. This enterotomy was then closed with 4-0 PDS in a running fashion to minimize contamination. Further dissection removing the remainder of the rectosigmoid from the posterior aspect of the pelvic mass was then undertaken. The pelvic mass was then able to be fully elevated from the pelvis. The left infundibulopelvic ligament was able to be isolated with the left ureter clearly visualized below. The left ureter was further dissected away from the medial leaf of the broad ligament, and a vessel loop was placed. The left infundibulopelvic ligament was then isolated, clamped, and transected using the LigaSure device as well as and suture-ligated. The left utero-ovarian ligament was then isolated, clamped, cauterized and transected. The left pelvic mass was then removed entirely.  Attention was then turned to the right. An additional enlarged multicystic right adnexal mass was noted, this was carefully elevated from the pelvis using blunt dissection. The right round ligament was then grasped, elevated, and transected. The broad ligament was then opened posteriorly. The ureter was identified and the right infundibulopelvic ligament was then isolated, clamped, transected, and tied. The right utero ovarian ligament was then isolated, clamped, and transected using the LigaSure device and the right adnexal mass was removed.  Attention then was turned toward the hysterectomy. A sponge stick was placed within the vagina. The right broad ligament dissection was opened anteriorly and the bladder flap was developed. The left round ligament was then further transected, and the broad ligament opened posteriorly and anteriorly to complete the bladder flap. The right uterine artery was skeletonized, clamped, transected, and suture-ligated. There was  significant parametrial tumor involvement on the left side, and therefore a radical dissection was required to isolate the blood supply. The left ureter was further dissected down to the level near the trigone. The left uterine artery was traced to its origin, isolated, and clamped with vascular clips. Similarly the left uterine vein due to significant necrotic  tumor involvement was friable. Detachment was similarly carefully isolated, and clipped with vascular clips. Sequential clamps were then used to transect the remainder of the broad and cardinal ligaments bilaterally, with each pedicle being suture-ligated. A large right angle clamp was then able to be placed below the cervix, and the uterus and cervix were amputated from the vagina. The vaginal cuff was then closed with several figure-of-eight sutures using 0 Vicryl.    02/15/2022 Pathology Results   A: Abdominal fat pad, excision - Fibroadipose tissue with residual mixed malignant germ cell tumor with treatment effect   B: Left adnexa, salpingo-oophorectomy - Residual mixed malignant germ cell tumor with treatment effect, [id number] (See Comment and Synoptic report) - FIGO stage IIIC - Fallopian tubes with no tumor present   C: Right adnexa, salpingo-oophorectomy - Residual mixed malignant germ cell tumor with treatment effect (See Synoptic report) - Fallopian tubes not identified   D: Left parametrium, biopsy - Fibroadipose tissue with residual mixed malignant germ cell tumor with treatment effect   E: Uterus and cervix, hysterectomy - Right adnexal soft tissue with residual mixed malignant germ cell tumor with treatment effect   Myometrium: - Adenomyosis - Leiomyomata measuring up to 0.7 cm - Extensive chronic serositis   Cervix:  - Ectocervix and endocervix with chronic cervicitis    Endometrium: - Inactive endometrium   F: Low anterior resection - Residual mixed malignant  germ cell tumor with treatment effect involving  colonic serosa and pericolonic adipose tissue - Proximal and distal colonic resection margins with chronic serositis, but negative for tumor    G: Right peritoneum, biopsy - Fibroadipose tissue with residual mixed malignant germ cell tumor with treatment effect   H: Right retroperitoneal mass, excision - Fibroadipose tissue with residual mixed malignant germ cell tumor with treatment effect   I: Omentum, omentectomy - Fibroadipose tissue with residual mixed malignant germ cell tumor with treatment effect   J: Gallbladder, cholecystectomy - Gallbladder with mild chronic serositis and fibrous adhesion   K: Right diaphragm, excision - Fibrous tissue with residual mixed malignant germ cell tumor with treatment effect - Hepatic tissue with no tumor present   L: Left diaphragm nodule, biopsy - Fibrous tissue with residual mixed malignant germ cell tumor with treatment effect   M: Spleen, splenectomy - Residual mixed malignant germ cell tumor with treatment effect involving splenic capsule   N: Appendix, appendectomy - Appendix with fibrous obliteration at the tip - Periappendiceal adipose tissue with acute and chronic inflammation   O: Small bowel mesenteric nodules, excision - Fibroadipose tissue with residual mixed malignant germ cell tumor with treatment effect   P: Mesenteric nodules, excision - Fibroadipose tissue with residual mixed malignant germ cell tumor with treatment effect   Q: Hepatic flexure mass, excision - Fibroadipose tissue with residual mixed malignant germ cell tumor with treatment effect   R: Anvil and donut, biopsy - Benign segment of colon with acute and chronic inflammation and fibrous adhesion  SPECIMEN    Procedure:    Radical hysterectomy     Procedure:    Omentectomy     Procedure:    Peritoneal tumor debulking     Hysterectomy Type:    Abdominal     Specimen Integrity:          Right Ovary Integrity:    Capsule intact     Specimen Integrity:           Left Ovary Integrity:    Capsule intact   TUMOR  Tumor Site:    Bilateral ovaries     Tumor Size:    Greatest Dimension (Centimeters): 19.5 cm     Histologic Type:    Mixed malignant germ cell tumor:  yolk sac tumor, mature teratoma, mature neural elements, immature neural tissue, and immature cartilage     Ovarian Surface Involvement:    Present, right and left     Other Tissue / Organ Involvement:    Right ovary     Other Tissue / Organ Involvement:    Left ovary     Other Tissue / Organ Involvement:    Right fallopian tube     Other Tissue / Organ Involvement:    Omentum     Other Tissue / Organ Involvement:    Diaphragm and splenic capsule     Largest Extrapelvic Peritoneal Focus:    Macroscopic (greater than 2 cm): Splenic capsule     Peritoneal / Ascitic Fluid Involvement:    Not submitted / unknown   REGIONAL LYMPH NODES     Regional Lymph Node Status:    Not applicable (no regional lymph nodes submitted or found)   DISTANT METASTASIS     Distant Site(s) Involved:    Diaphragm, splenic capsule   pTNM CLASSIFICATION (AJCC 8th Edition)     Reporting of pT, pN, and (when applicable) pM categories is based on information available to the pathologist at the time the report is issued. As per the AJCC (Chapter 1, 8th Ed.) it is the managing physician's responsibility to establish the final pathologic stage based upon all pertinent information, including but potentially not limited to this pathology report.     Modified Classification:    y     pT Category:    pT3c     pN Category:    pN not assigned (no nodes submitted or found)     pM Category:    pM1   FIGO STAGE     FIGO Stage:    IIIC   COMMENT:  The needle core biopsies show a heterogeneous combination of elements including abundant neural tissue with atypical and immature features. There are also atypical glandular epithelial structures and focal yolk sac elements with microcystic pattern.  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  Immunohistochemistry is performed for cytokeratin AE1/AE3, CD117, CD30, G FAP, glypican-3, OCT3/4, synaptophysin, S100 and CD30.       Interval History: Doing well.  Pain is improving.  Would like to try pain medication less strong than oxycodone.  Managing her bag although has had significant episodes of leakage and has almost run out of supplies.  Voiding without difficulty.  Has a voracious appetite, working on eating more.  Denies any vaginal bleeding  Past Medical/Surgical History: Past Medical History:  Diagnosis Date   Arthritis    Endometriosis    Family history of breast cancer in mother    at age 10   GAD (generalized anxiety disorder)    lexapro helped 2017 (Dr. Hoy Finlay did have extra stress of the Master's degree program on her at that time.  Got counseling.       GERD (gastroesophageal reflux disease)    Gestational diabetes    Kidney stones    Low back pain    Dr. Charlann Boxer.   Neck pain    and left shoulder pain: managed by osteopathic manipulation by Dr. Charlann Boxer. ?cervical radiculopathy   PAC (premature  atrial contraction)     Past Surgical History:  Procedure Laterality Date   ABDOMINAL EXPLORATION SURGERY  2009   fulguration of endometriosis - [facility] surgery   IR IMAGING GUIDED PORT INSERTION  12/08/2021   IR US GUIDE BX ASP/DRAIN  12/08/2021   SALPINGECTOMY Right 2009   TONSILLECTOMY     TUBAL LIGATION Left 2009    Family History  Problem Relation Age of Onset   Rheum arthritis Mother    Stroke Mother    Hypertension Mother    Heart disease Mother    Arthritis Mother    Breast cancer Mother    Heart attack Mother 37   Diabetes Father    Heart disease Father    Hyperlipidemia Father    Hypertension Father    Heart attack Father 26   Heart disease Maternal Grandmother    Heart attack Maternal Grandmother 88   Arthritis Maternal Grandmother     Early death Maternal Grandfather        MVA   Diabetes Paternal Grandmother    Hypertension Paternal Grandmother    Heart disease Paternal Grandmother    Heart attack Paternal Grandmother 15   Heart disease Paternal Grandfather    Heart attack Paternal Grandfather 92   Coronary artery disease Other    Glaucoma Other    Non-Hodgkin's lymphoma Paternal Uncle     Social History   Socioeconomic History   Marital status: Married    Spouse name: Not on file   Number of children: 3   Years of education: Not on file   Highest education level: Not on file  Occupational History   Occupation: Optician, dispensing: [employer]    Comment: at [employer]   Tobacco Use   Smoking status: Never   Smokeless tobacco: Never  Vaping Use   Vaping Use: Never used  Substance and Sexual Activity   Alcohol use: Yes    Comment: Occasionally   Drug use: No   Sexual activity: Yes    Partners: Male    Birth control/protection: Surgical  Other Topics Concern   Not on file  Social History Narrative   Married, 3 kids.    Lives in [place].   Educ: Masters deg nursing.   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   No tob/drugs.   Alc: social.   Social Determinants of Health   Financial Resource Strain: Not on file  Food Insecurity: No Food Insecurity (01/05/2022)   Hunger Vital Sign    Worried About Running Out of Food in the Last Year: Never true    Ran Out of Food in the Last Year: Never true  Transportation Needs: No Transportation Needs (01/05/2022)   PRAPARE - Hydrologist (Medical): No    Lack of Transportation (Non-Medical): No  Physical Activity: Not on file  Stress: Not on file  Social Connections: Not on file    Current Medications:  Current Outpatient Medications:    acetaminophen (TYLENOL) 650 MG CR tablet, Take 650 mg by mouth every 8 (eight) hours as needed for pain.,  Disp: , Rfl:    apixaban (ELIQUIS) 2.5 MG TABS tablet, Take 1 tablet (2.5 mg total) by mouth 2 (two) times daily., Disp: 60 tablet, Rfl: 0   famotidine (PEPCID) 10 MG tablet, Take 10 mg by mouth daily as needed for heartburn or indigestion., Disp: ,  Rfl:    Ferrous Sulfate (IRON PO), Take 1 tablet by mouth daily., Disp: , Rfl:    LORazepam (ATIVAN) 0.5 MG tablet, Take 1 tablet (0.5 mg total) by mouth every 8 (eight) hours. (Patient taking differently: Take 0.5 mg by mouth every 8 (eight) hours as needed for anxiety.), Disp: 30 tablet, Rfl: 0   pregabalin (LYRICA) 25 MG capsule, Take by mouth., Disp: , Rfl:    simethicone (MYLICON) 80 MG chewable tablet, Chew by mouth., Disp: , Rfl:    traMADol (ULTRAM) 50 MG tablet, Take 1 tablet (50 mg total) by mouth every 6 (six) hours as needed for severe pain. For AFTER surgery, do not take and drive, Disp: 30 tablet, Rfl: 0   metoprolol succinate (TOPROL-XL) 50 MG 24 hr tablet, Take 1 tablet (50 mg total) by mouth daily., Disp: 30 tablet, Rfl: 11  Review of Systems: Denies appetite changes, fevers, chills, fatigue, unexplained weight changes. Denies hearing loss, neck lumps or masses, mouth sores, ringing in ears or voice changes. Denies cough or wheezing.  Denies shortness of breath. Denies chest pain or palpitations. Denies leg swelling. Denies abdominal distention, pain, blood in stools, constipation, diarrhea, nausea, vomiting, or early satiety. Denies pain with intercourse, dysuria, frequency, hematuria or incontinence. Denies hot flashes, pelvic pain, vaginal bleeding or vaginal discharge.   Denies joint pain, back pain or muscle pain/cramps. Denies itching, rash, or wounds. Denies dizziness, headaches, numbness or seizures. Denies swollen lymph nodes or glands, denies easy bruising or bleeding. Denies anxiety, depression, confusion, or decreased concentration.  Physical Exam: BP 122/71 (BP Location: Left Arm, Patient Position: Sitting)   Pulse  (!) 109   Resp 14   Ht '4\' 11"'$  (1.499 m)   Wt 94 lb 12.8 oz (43 kg)   SpO2 100%   BMI 19.15 kg/m  General: Alert, oriented, no acute distress. HEENT: Normocephalic, atraumatic, sclera anicteric. Chest: Clear to auscultation bilaterally.  No wheezes or rhonchi. Cardiovascular: Heart rate in the high 90s, regular rhythm, no murmurs. Abdomen: Soft, nondistended, nontender to palpation.  Incision is intact with minimal Dermabond still in place.  No erythema or induration.  Stoma is pink and viable with liquid stool in the bag. Extremities: Grossly normal range of motion.  Warm, well perfused.  No edema bilaterally.  Laboratory & Radiologic Studies: None new  Assessment & Plan: CHONDA BANEY is a 52 y.o. 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  Patient is doing very well from a postoperative standpoint.  Discussed continued expectations and restrictions.  I will plan to do a pelvic exam when I see her back in approximately 2 weeks.  She is interested in transitioning to something less strong from a narcotic standpoint.  Discussed trial of tramadol, which was sent to her pharmacy today.  Asked her not to use the tramadol on oxycodone at the same time.  Will check chemistries.  Patient is mildly tachycardic today, significantly improved from prior to surgery.  I suspect that this is tumor related and not dehydration, but in the setting of a diverting ileostomy, we will assure that labs indicate normal fluid status.  We discussed adjuvant treatment plan again today.  I am anticipating restarting her on chemotherapy approximately 4 weeks after surgery.  Given pulmonary toxicity associated with bleomycin, I am hoping that we can give her etoposide and cisplatin only  for 2-3 cycles.  20 minutes of total time was spent for this patient encounter,  including preparation, face-to-face counseling with the patient and coordination of care, and documentation of the encounter.  Jeral Pinch, MD  Division of Gynecologic Oncology  Department of Obstetrics and Gynecology  [facility]

## 2022-03-02 NOTE — Patient Instructions (Signed)
It was good to see you today. I'll let you know how labs look. Please try the tramadol and let me know how things goes. Please try just the tramadol without the oxycodone.  I will see you in a couple of weeks and we will take a look at the vaginal cuff at that visit.

## 2022-03-03 ENCOUNTER — Telehealth: Payer: Self-pay | Admitting: Oncology

## 2022-03-03 ENCOUNTER — Encounter: Payer: Self-pay | Admitting: Hematology and Oncology

## 2022-03-03 ENCOUNTER — Other Ambulatory Visit: Payer: Self-pay | Admitting: Hematology and Oncology

## 2022-03-03 ENCOUNTER — Telehealth: Payer: Self-pay | Admitting: *Deleted

## 2022-03-03 DIAGNOSIS — C569 Malignant neoplasm of unspecified ovary: Secondary | ICD-10-CM

## 2022-03-03 NOTE — Unmapped (Signed)
Faxed ostomy order form to DME company c/o Alexus Ford at [phone]. Phone number [phone]. Received confirmation of successful fax.

## 2022-03-03 NOTE — Telephone Encounter (Signed)
Per request fax office notes to [facility]

## 2022-03-03 NOTE — Telephone Encounter (Signed)
[name] and scheduled an appointment for port flush/labs at 1:15 and to see Dr. Alvy Bimler at 2:00 to discuss restarting chemotherapy.  Discussed That chemo will start on 03/14/21 and will be 5 says a week q 3 weeks.  [name] verbalized understanding and would like chemo to start on Monday's and would like early appointments if possible.

## 2022-03-03 NOTE — Progress Notes (Signed)
ALERT: A disease instance has been permanently removed from this patient's pathway record and replaced with a new disease instance. Information on the new disease instance will be transmitted in a separate message.  Disease Being Removed: Ovarian  Reason for Removal: Previous diagnosis has morphed into a different diagnosis

## 2022-03-03 NOTE — Progress Notes (Signed)
START OFF PATHWAY REGIMEN - Other   [id number]:Cisplatin 20 mg/m2 D1-5 + Etoposide 100 mg/m2 D1-5 q21 Days:   A cycle is every 21 days:     Etoposide      Cisplatin   **Always confirm dose/schedule in your pharmacy ordering system**  Patient Characteristics: Intent of Therapy: Curative Intent, Discussed with Patient

## 2022-03-04 ENCOUNTER — Ambulatory Visit (HOSPITAL_COMMUNITY)
Admission: RE | Admit: 2022-03-04 | Discharge: 2022-03-04 | Disposition: A | Discharge status: Home / Self Care | Payer: 59 | Source: Ambulatory Visit | Attending: Gynecologic Oncology | Admitting: Gynecologic Oncology

## 2022-03-04 ENCOUNTER — Encounter: Payer: 59 | Admitting: Gynecologic Oncology

## 2022-03-04 ENCOUNTER — Other Ambulatory Visit: Payer: Self-pay

## 2022-03-04 DIAGNOSIS — Z933 Colostomy status: Secondary | ICD-10-CM | POA: Diagnosis present

## 2022-03-04 DIAGNOSIS — L24B3 Irritant contact dermatitis related to fecal or urinary stoma or fistula: Secondary | ICD-10-CM | POA: Diagnosis not present

## 2022-03-04 DIAGNOSIS — K9413 Enterostomy malfunction: Secondary | ICD-10-CM

## 2022-03-04 NOTE — Progress Notes (Signed)
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 Hoy Finlay did have extra stress of the Master's degree program on her at that time.  Got counseling.       GERD (gastroesophageal reflux disease)    Gestational diabetes    Kidney stones    Low back pain    Dr. Charlann Boxer.   Neck pain    and left shoulder pain: managed by osteopathic manipulation by Dr. Charlann Boxer. ?cervical radiculopathy   PAC (premature atrial contraction)    Family History  Problem Relation Age of Onset   Rheum arthritis Mother    Stroke Mother    Hypertension Mother    Heart disease Mother    Arthritis Mother    Breast cancer Mother    Heart attack Mother 26   Diabetes Father    Heart disease Father    Hyperlipidemia Father    Hypertension Father    Heart attack Father 7   Heart disease Maternal Grandmother    Heart attack Maternal Grandmother 88   Arthritis Maternal Grandmother    Early death Maternal Grandfather        MVA   Diabetes Paternal Grandmother    Hypertension Paternal Grandmother    Heart disease Paternal Grandmother    Heart attack Paternal Grandmother 9   Heart disease Paternal Grandfather    Heart attack Paternal Grandfather 92   Coronary artery disease Other    Glaucoma Other    Non-Hodgkin's lymphoma Paternal Uncle    Allergies  Allergen Reactions   Latex Other (See Comments)    Sensitivity only   Sulfa Antibiotics Hives and Other (See Comments)   Sulfamethoxazole-Trimethoprim Rash   Septra [Bactrim] Rash   Current Outpatient Medications  Medication Sig Dispense Refill Last Dose   acetaminophen (TYLENOL) 650 MG CR tablet Take 650 mg by mouth  every 8 (eight) hours as needed for pain.      apixaban (ELIQUIS) 2.5 MG TABS tablet Take 1 tablet (2.5 mg total) by mouth 2 (two) times daily. 60 tablet 0    famotidine (PEPCID) 10 MG tablet Take 10 mg by mouth daily as needed for heartburn or indigestion.      Ferrous Sulfate (IRON PO) Take 1 tablet by mouth daily.      LORazepam (ATIVAN) 0.5 MG tablet Take 1 tablet (0.5 mg total) by mouth every 8 (eight) hours. (Patient taking differently: Take 0.5 mg by mouth every 8 (eight) hours as needed for anxiety.) 30 tablet 0    metoprolol succinate (TOPROL-XL) 50 MG 24 hr tablet Take 1 tablet (50 mg total) by mouth daily. 30 tablet 11    pregabalin (LYRICA) 25 MG capsule Take by mouth.      simethicone (MYLICON) 80 MG chewable tablet Chew by mouth.      traMADol (ULTRAM) 50 MG tablet Take 1 tablet (50 mg total) by mouth every 6 (six) hours as needed for severe pain. For AFTER surgery, do not take and drive 30 tablet 0    No current facility-administered medications for this encounter.   ROS  Review of Systems  Constitutional:  Positive for  fatigue.       Weakness  Gastrointestinal:        RLQ ileostomy  Musculoskeletal:        Weakness and pain  Skin:  Positive for rash and wound.  Psychiatric/Behavioral:  The patient is nervous/anxious.        Is afraid of eating the wrong foods.  We discuss blockage, eating a balanced diet. Cutting food into small pieces and chewing well.   All other systems reviewed and are negative.  Vital signs:  BP 111/80 (BP Location: Right Arm)   Pulse (!) 114   Temp 97.9 F (36.6 C) (Oral)   Resp 18   SpO2 99%  Exam:  Physical Exam Vitals reviewed.  Constitutional:      Appearance: She is ill-appearing.  Abdominal:     Palpations: Abdomen is soft.     Comments: Large tumor removed, resulting in excess abdominal skin  Skin:    General: Skin is warm and dry.     Findings: Rash present.     Comments: Peristomal breakdown from frequent leaks.  Patient  describes being awakened in the middle of the night with bag "explosion" .  Stool gathers in umbilicus  Neurological:     Mental Status: She is alert and oriented to person, place, and time.  Psychiatric:        Mood and Affect: Mood normal.        Behavior: Behavior normal.     Stoma type/location:  RLQ ileostomy,  Stomal assessment/size:  1 1/4" flush stoma Peristomal assessment:  breakdown present circumferentially .  Applied stoma powder and skin prep Treatment options for stomal/peristomal skin: POwder skin prep, barrier ring and 1 piece convex pouch.  Needs belt, but will need a smaller belt that I do not have.  Output: liquid green stool Ostomy pouching: 1pc. Convex with barrier ring and belt. Education provided:  see above pouching with 1 piece, purpose of convexity, treating peristomal breakdown.  Purpose of belt.  Will need to update orders with [facility]    Impression/dx  Ileostomy Contact dermatitis Discussion  See above Emotional support provided that we will find correct pouch and changes will be predictable and minimize leaks.  Plan  See back in one week.     Visit time: 45 minutes.   Domenic Moras FNP-BC

## 2022-03-04 NOTE — Discharge Instructions (Signed)
Switching to 1 piece convex See you Tuesday!  Will update [employer]

## 2022-03-05 DIAGNOSIS — K9413 Enterostomy malfunction: Secondary | ICD-10-CM | POA: Insufficient documentation

## 2022-03-05 DIAGNOSIS — L24B3 Irritant contact dermatitis related to fecal or urinary stoma or fistula: Secondary | ICD-10-CM | POA: Insufficient documentation

## 2022-03-07 ENCOUNTER — Encounter: Payer: Self-pay | Admitting: Hematology and Oncology

## 2022-03-07 ENCOUNTER — Other Ambulatory Visit (HOSPITAL_COMMUNITY): Payer: Self-pay

## 2022-03-07 ENCOUNTER — Telehealth: Payer: Self-pay | Admitting: Surgery

## 2022-03-07 ENCOUNTER — Encounter: Payer: Self-pay | Admitting: Surgery

## 2022-03-07 ENCOUNTER — Other Ambulatory Visit: Payer: Self-pay | Admitting: Gynecologic Oncology

## 2022-03-07 DIAGNOSIS — M792 Neuralgia and neuritis, unspecified: Secondary | ICD-10-CM

## 2022-03-07 DIAGNOSIS — Z79899 Other long term (current) drug therapy: Secondary | ICD-10-CM

## 2022-03-07 MED ORDER — PREGABALIN 25 MG PO CAPS
25.0000 mg | ORAL_CAPSULE | Freq: Every day | ORAL | 0 refills | Status: DC
  Filled 2022-03-07: qty 30, 30d supply, fill #0
  Filled 2022-03-10: qty 26, 26d supply, fill #0
  Filled 2022-03-10: qty 4, 4d supply, fill #0
  Filled 2022-03-14: qty 26, 26d supply, fill #1

## 2022-03-07 MED ORDER — APIXABAN 2.5 MG PO TABS
2.5000 mg | ORAL_TABLET | Freq: Two times a day (BID) | ORAL | 0 refills | Status: DC
  Filled 2022-03-07: qty 60, 30d supply, fill #0

## 2022-03-07 NOTE — Unmapped (Signed)
[facility] Triage Note    Reason for call: Return call    Time for incident: 1556 - 1607    Last seen in clinic on 02/22/22  Next clinic visit scheduled for 03/11/22    NN note:  ...follow up with [employer] [facility], the number is [phone] option 2.     Phone Assessment: Attempted to reach pt X 2 - both times she answered but was unable to hear me.  Called husband to give update on status of DME supplies.  Relayed note from NN.  Attempted to give him follow up number to supply company, but he was at work and unable to take down number.  Will let wife know supply order in and confirmed.     Caller's Response: Thank you so much.     Outstanding tasks: None

## 2022-03-07 NOTE — Unmapped (Signed)
Hi,     Alexus contacted the Communication Center requesting to speak with the care team of Breland Kilcrease to discuss:    -Alexus staed she is calling to speak to her nurse Thalia Party in regards to [facility] and her Ostomy Care and DME supplies    Please contact Kristan  at   [phone]    .      Thank you,   Warnell Bureau  [facility]   [phone]

## 2022-03-07 NOTE — Telephone Encounter (Signed)
Patient called in stating she is almost out of her Eliquis and Lyrica. Requests refills on both. Routed to Dr Berline Lopes and Joylene John.

## 2022-03-07 NOTE — Telephone Encounter (Signed)
Message sent to patient via mychart to let her know medication refills have been sent.

## 2022-03-08 ENCOUNTER — Other Ambulatory Visit: Payer: Self-pay

## 2022-03-08 ENCOUNTER — Ambulatory Visit (HOSPITAL_COMMUNITY)
Admission: RE | Admit: 2022-03-08 | Discharge: 2022-03-08 | Disposition: A | Discharge status: Home / Self Care | Payer: 59 | Source: Ambulatory Visit | Attending: Family Medicine | Admitting: Family Medicine

## 2022-03-08 DIAGNOSIS — L24B3 Irritant contact dermatitis related to fecal or urinary stoma or fistula: Secondary | ICD-10-CM

## 2022-03-08 DIAGNOSIS — Z932 Ileostomy status: Secondary | ICD-10-CM | POA: Insufficient documentation

## 2022-03-08 NOTE — Unmapped (Signed)
Called patient to close the loop on her incoming call today.   - Patient informed me she is receiving care with a WOCN at [facility] in [place]. Requested that I cancel Friday's WOCN appt at [facility].   - Patient informed me her ostomy supplies have not arrived. Will follow up with [employer] tomorrow. Patient given [employer]'s phone number as well.

## 2022-03-08 NOTE — Progress Notes (Signed)
Pharmacist Chemotherapy Monitoring - Initial Assessment    Anticipated start date: 03/14/22   The following has been reviewed per standard work regarding the patient's treatment regimen: The patient's diagnosis, treatment plan and drug doses, and organ/hematologic function Lab orders and baseline tests specific to treatment regimen  The treatment plan start date, drug sequencing, and pre-medications Prior authorization status  Patient's documented medication list, including drug-drug interaction screen and prescriptions for anti-emetics and supportive care specific to the treatment regimen The drug concentrations, fluid compatibility, administration routes, and timing of the medications to be used The patient's access for treatment and lifetime cumulative dose history, if applicable  The patient's medication allergies and previous infusion related reactions, if applicable   Changes made to treatment plan:  N/A  Follow up needed:  Pending authorization for treatment  Need home antiemetic   Kennith Center, Pharm.D., CPP 03/08/2022'@3'$ :23 PM

## 2022-03-08 NOTE — Progress Notes (Signed)
[name]   Reason for visit:  RLQ ileostomy  prefers coloplast 1 piece convex  HPI:   Past Medical History:  Diagnosis Date   Arthritis    Endometriosis    Family history of breast cancer in mother    at age 51   GAD (generalized anxiety disorder)    lexapro helped 2017 (Dr. Hoy Finlay did have extra stress of the Master's degree program on her at that time.  Got counseling.       GERD (gastroesophageal reflux disease)    Gestational diabetes    Kidney stones    Low back pain    Dr. Charlann Boxer.   Neck pain    and left shoulder pain: managed by osteopathic manipulation by Dr. Charlann Boxer. ?cervical radiculopathy   PAC (premature atrial contraction)    Family History  Problem Relation Age of Onset   Rheum arthritis Mother    Stroke Mother    Hypertension Mother    Heart disease Mother    Arthritis Mother    Breast cancer Mother    Heart attack Mother 54   Diabetes Father    Heart disease Father    Hyperlipidemia Father    Hypertension Father    Heart attack Father 34   Heart disease Maternal Grandmother    Heart attack Maternal Grandmother 88   Arthritis Maternal Grandmother    Early death Maternal Grandfather        MVA   Diabetes Paternal Grandmother    Hypertension Paternal Grandmother    Heart disease Paternal Grandmother    Heart attack Paternal Grandmother 46   Heart disease Paternal Grandfather    Heart attack Paternal Grandfather 92   Coronary artery disease Other    Glaucoma Other    Non-Hodgkin's lymphoma Paternal Uncle    Allergies  Allergen Reactions   Latex Other (See Comments)    Sensitivity only   Sulfa Antibiotics Hives and Other (See Comments)   Sulfamethoxazole-Trimethoprim Rash   Septra [Bactrim] Rash   Current Outpatient Medications  Medication Sig Dispense Refill Last Dose   acetaminophen (TYLENOL) 650 MG CR tablet Take 650 mg by mouth every 8 (eight) hours as needed for pain.      apixaban (ELIQUIS) 2.5 MG TABS  tablet Take 1 tablet (2.5 mg total) by mouth 2 (two) times daily. 60 tablet 0    famotidine (PEPCID) 10 MG tablet Take 10 mg by mouth daily as needed for heartburn or indigestion.      Ferrous Sulfate (IRON PO) Take 1 tablet by mouth daily.      LORazepam (ATIVAN) 0.5 MG tablet Take 1 tablet (0.5 mg total) by mouth every 8 (eight) hours. (Patient taking differently: Take 0.5 mg by mouth every 8 (eight) hours as needed for anxiety.) 30 tablet 0    metoprolol succinate (TOPROL-XL) 50 MG 24 hr tablet Take 1 tablet (50 mg total) by mouth daily. 30 tablet 11    pregabalin (LYRICA) 25 MG capsule Take 1 capsule (25 mg total) by mouth at bedtime. 30 capsule 0    simethicone (MYLICON) 80 MG chewable tablet Chew by mouth.      traMADol (ULTRAM) 50 MG tablet Take 1 tablet (50 mg total) by mouth every 6 (six) hours as needed for severe pain. For AFTER surgery, do not take and drive 30 tablet 0    No current facility-administered medications for this encounter.   ROS  Review of Systems  Gastrointestinal:  RLQ ileostomy  Skin:  Positive for rash.  Psychiatric/Behavioral:         Feeling more confident in ostomy care at this time.  All other systems reviewed and are negative. Vital signs:  BP 112/64 (BP Location: Right Arm)   Pulse 97   Temp 97.8 F (36.6 C) (Oral)   Resp 18   SpO2 100%  Exam:  Physical Exam Vitals reviewed.  Constitutional:      Appearance: Normal appearance.  HENT:     Mouth/Throat:     Mouth: Mucous membranes are moist.  Abdominal:     Palpations: Abdomen is soft.     Comments: RLQ ileostomy  Skin:    General: Skin is warm and dry.     Findings: Rash present.  Neurological:     Mental Status: She is alert and oriented to person, place, and time.  Psychiatric:        Mood and Affect: Mood normal.        Behavior: Behavior normal.     Stoma type/location:  RLQ ileostomy Stomal assessment/size:  1 1/8" pink and moist  Peristomal assessment:  skin breakdown at  6 and 3 o'clock, partial thickness tissue loss will implement powder and skin prep.  Os points down towards 6 o'clock Treatment options for stomal/peristomal skin: stoma powder skin prep Output: liquid brown stool Ostomy pouching: 1pc. Coloplast with barrier ring   stoma powder and skin prep  Education provided:  we perform pouch change.  I will update orders with [employer].  She has still not received any supplies.     Impression/dx  Contact dermatitis ileostomy Discussion  Will set up for supplies. Provided 1piece convex pouch.  Her ordered supplies from [employer] were 2 piece and they leaked multiple times daily Plan  See back in 2 weeks.  Has infusions all next week.     Visit time: 45 minutes.   Domenic Moras FNP-BC

## 2022-03-08 NOTE — Discharge Instructions (Signed)
1 piece convex 35 mm  Stoma powder Skin prep Barrier ring Warm after applying

## 2022-03-09 ENCOUNTER — Other Ambulatory Visit: Payer: Self-pay

## 2022-03-09 ENCOUNTER — Other Ambulatory Visit (HOSPITAL_COMMUNITY): Payer: Self-pay

## 2022-03-10 ENCOUNTER — Other Ambulatory Visit (HOSPITAL_COMMUNITY): Payer: Self-pay

## 2022-03-10 ENCOUNTER — Inpatient Hospital Stay (HOSPITAL_BASED_OUTPATIENT_CLINIC_OR_DEPARTMENT_OTHER): Payer: 59 | Admitting: Hematology and Oncology

## 2022-03-10 ENCOUNTER — Encounter: Payer: Self-pay | Admitting: Hematology and Oncology

## 2022-03-10 ENCOUNTER — Other Ambulatory Visit: Payer: Self-pay

## 2022-03-10 ENCOUNTER — Other Ambulatory Visit: Payer: Self-pay | Admitting: Hematology and Oncology

## 2022-03-10 ENCOUNTER — Inpatient Hospital Stay: Payer: 59

## 2022-03-10 VITALS — BP 98/75 | HR 99 | Temp 98.4°F | Resp 18 | Ht 59.0 in | Wt 96.8 lb

## 2022-03-10 DIAGNOSIS — D61818 Other pancytopenia: Secondary | ICD-10-CM

## 2022-03-10 DIAGNOSIS — C569 Malignant neoplasm of unspecified ovary: Secondary | ICD-10-CM

## 2022-03-10 DIAGNOSIS — E441 Mild protein-calorie malnutrition: Secondary | ICD-10-CM

## 2022-03-10 DIAGNOSIS — Z5111 Encounter for antineoplastic chemotherapy: Secondary | ICD-10-CM | POA: Diagnosis not present

## 2022-03-10 DIAGNOSIS — D75838 Other thrombocytosis: Secondary | ICD-10-CM | POA: Diagnosis not present

## 2022-03-10 DIAGNOSIS — H9109 Ototoxic hearing loss, unspecified ear: Secondary | ICD-10-CM

## 2022-03-10 DIAGNOSIS — H9190 Unspecified hearing loss, unspecified ear: Secondary | ICD-10-CM | POA: Insufficient documentation

## 2022-03-10 LAB — CBC WITH DIFFERENTIAL (CANCER CENTER ONLY)
Abs Immature Granulocytes: 0.06 10*3/uL (ref 0.00–0.07)
Basophils Absolute: 0.1 10*3/uL (ref 0.0–0.1)
Basophils Relative: 1 %
Eosinophils Absolute: 0.2 10*3/uL (ref 0.0–0.5)
Eosinophils Relative: 1 %
HCT: 35 % — ABNORMAL LOW (ref 36.0–46.0)
Hemoglobin: 11.3 g/dL — ABNORMAL LOW (ref 12.0–15.0)
Immature Granulocytes: 0 %
Lymphocytes Relative: 13 %
Lymphs Abs: 1.9 10*3/uL (ref 0.7–4.0)
MCH: 28.7 pg (ref 26.0–34.0)
MCHC: 32.3 g/dL (ref 30.0–36.0)
MCV: 88.8 fL (ref 80.0–100.0)
Monocytes Absolute: 1.5 10*3/uL — ABNORMAL HIGH (ref 0.1–1.0)
Monocytes Relative: 11 %
Neutro Abs: 10.1 10*3/uL — ABNORMAL HIGH (ref 1.7–7.7)
Neutrophils Relative %: 74 %
Platelet Count: 582 10*3/uL — ABNORMAL HIGH (ref 150–400)
RBC: 3.94 MIL/uL (ref 3.87–5.11)
RDW: 19 % — ABNORMAL HIGH (ref 11.5–15.5)
WBC Count: 13.8 10*3/uL — ABNORMAL HIGH (ref 4.0–10.5)
nRBC: 0 % (ref 0.0–0.2)

## 2022-03-10 LAB — CMP (CANCER CENTER ONLY)
ALT: 34 U/L (ref 0–44)
AST: 24 U/L (ref 15–41)
Albumin: 3.7 g/dL (ref 3.5–5.0)
Alkaline Phosphatase: 83 U/L (ref 38–126)
Anion gap: 6 (ref 5–15)
BUN: 13 mg/dL (ref 6–20)
CO2: 32 mmol/L (ref 22–32)
Calcium: 9.5 mg/dL (ref 8.9–10.3)
Chloride: 104 mmol/L (ref 98–111)
Creatinine: 0.58 mg/dL (ref 0.44–1.00)
GFR, Estimated: >60 mL/min (ref >60)
Glucose, Bld: 88 mg/dL (ref 70–99)
Potassium: 4.2 mmol/L (ref 3.5–5.1)
Sodium: 142 mmol/L (ref 135–145)
Total Bilirubin: 0.3 mg/dL (ref 0.3–1.2)
Total Protein: 6.9 g/dL (ref 6.5–8.1)

## 2022-03-10 LAB — SAMPLE TO BLOOD BANK

## 2022-03-10 LAB — MAGNESIUM: Magnesium: 1.7 mg/dL (ref 1.7–2.4)

## 2022-03-10 MED ORDER — DEXAMETHASONE 4 MG PO TABS: 8.0000 mg | ORAL_TABLET | Freq: Every day | ORAL | 1 refills | Status: DC

## 2022-03-10 MED ORDER — HEPARIN SOD (PORK) LOCK FLUSH 100 UNIT/ML IV SOLN
500.0000 [IU] | Freq: Once | INTRAVENOUS | Status: AC
  Administered 2022-03-10: 500 [IU]

## 2022-03-10 MED ORDER — ONDANSETRON HCL 8 MG PO TABS: 8.0000 mg | ORAL_TABLET | Freq: Three times a day (TID) | ORAL | 1 refills | Status: DC | PRN

## 2022-03-10 MED ORDER — PROCHLORPERAZINE MALEATE 10 MG PO TABS: 10.0000 mg | ORAL_TABLET | Freq: Four times a day (QID) | ORAL | 1 refills | Status: DC | PRN

## 2022-03-10 MED ORDER — SODIUM CHLORIDE 0.9% FLUSH
10.0000 mL | Freq: Once | INTRAVENOUS | Status: AC
  Administered 2022-03-10: 10 mL

## 2022-03-10 NOTE — Assessment & Plan Note (Signed)
She has lost a lot of weight She is eating better and is gaining weight Recommend dietitian consult

## 2022-03-10 NOTE — Assessment & Plan Note (Signed)
This is reactive in nature Observe She will continue apixaban for DVT prophylaxis

## 2022-03-10 NOTE — Assessment & Plan Note (Signed)
She had amazing response to neoadjuvant chemotherapy and has successful surgery We discussed the role of adjuvant treatment I recommend 2-3 cycles of cisplatin etoposide Due to hearing loss, I recommend minor dose adjustment of cisplatin I plan to repeat imaging study after completion of adjuvant treatment We discussed importance of supportive care and close follow-up and monitoring of her blood count after treatment

## 2022-03-10 NOTE — Assessment & Plan Note (Signed)
This is likely due to cisplatin I will plan minor dose adjustment

## 2022-03-10 NOTE — Progress Notes (Signed)
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[facility]) She had amazing response to neoadjuvant chemotherapy and has successful surgery We discussed the role of adjuvant treatment I recommend 2-3 cycles of cisplatin etoposide Due to hearing loss, I recommend minor dose adjustment of cisplatin I plan to repeat imaging study after completion of adjuvant treatment We discussed importance of supportive care and close follow-up and monitoring of her blood count after treatment  Pancytopenia, acquired ([place]) Her anemia is improving She has received numerous blood transfusions in the past I will monitor her blood count when she returns the week after chemotherapy in case she needs blood  Reactive thrombocytosis This is reactive in nature Observe She will continue apixaban for DVT prophylaxis  Protein-calorie malnutrition ([place]) She has lost a lot of weight She is eating better and is gaining weight Recommend dietitian consult  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   All questions were answered. The patient knows to call the clinic with any problems, questions or concerns. The total time spent in the appointment was 40 minutes encounter with patients including review of chart and various tests results, discussions about plan of care and coordination of care plan   Heath Lark, MD 03/10/2022 3:27 PM  INTERVAL HISTORY: Please see below for problem oriented charting. she returns for treatment follow-up after recent surgery She is healing well since her surgery Her  ostomy is functioning She is starting to gain weight She had minor hearing loss since last time I saw her Denies nausea or constipation No bleeding problems with anticoagulation therapy We reviewed recommendation for additional adjuvant treatment  REVIEW OF SYSTEMS:   Constitutional: Denies fevers, chills or abnormal weight loss Eyes: Denies blurriness of vision Ears, nose, mouth, throat, and face: Denies mucositis or sore throat Respiratory: Denies cough, dyspnea or wheezes Cardiovascular: Denies palpitation, chest discomfort or lower extremity swelling Gastrointestinal:  Denies nausea, heartburn or change in bowel habits Skin: Denies abnormal skin rashes Lymphatics: Denies new lymphadenopathy or easy bruising Neurological:Denies numbness, tingling or new weaknesses Behavioral/Psych: Mood is stable, no new changes  All other systems were reviewed with the patient and are negative.  I have reviewed the past medical history, past surgical history, social history and family history with the patient and they are unchanged from previous note.  ALLERGIES:  is allergic to latex, sulfa antibiotics, sulfamethoxazole-trimethoprim, and septra [bactrim].  MEDICATIONS:  Current Outpatient Medications  Medication Sig Dispense Refill   cholecalciferol (VITAMIN D3) 25 MCG (1000 UNIT) tablet Take 2,000 Units by mouth daily.     acetaminophen (TYLENOL) 650 MG CR tablet Take 650 mg by mouth every 8 (eight) hours as needed for pain.     apixaban (ELIQUIS) 2.5 MG TABS tablet Take 1 tablet (2.5 mg total) by mouth 2 (two) times daily. 60 tablet 0   dexamethasone (DECADRON) 4 MG tablet Take 2 tablets (8 mg total) by mouth daily. Start the day after last cisplatin dose on day 6 of chemo x 3 days.Take with food. 30 tablet 1   famotidine (PEPCID) 10 MG tablet Take 10 mg by mouth  daily as needed for heartburn or indigestion.     LORazepam (ATIVAN) 0.5 MG tablet Take 1 tablet (0.5 mg total) by mouth every 8  (eight) hours. (Patient taking differently: Take 0.5 mg by mouth every 8 (eight) hours as needed for anxiety.) 30 tablet 0   ondansetron (ZOFRAN) 8 MG tablet Take 1 tablet (8 mg total) by mouth every 8 (eight) hours as needed for nausea or vomiting. Start on the third day after last cisplatin dose on day 8 of chemo 30 tablet 1   pregabalin (LYRICA) 25 MG capsule Take 1 capsule (25 mg total) by mouth at bedtime. 30 capsule 0   prochlorperazine (COMPAZINE) 10 MG tablet Take 1 tablet (10 mg total) by mouth every 6 (six) hours as needed for nausea or vomiting. 30 tablet 1   simethicone (MYLICON) 80 MG chewable tablet Chew by mouth.     traMADol (ULTRAM) 50 MG tablet Take 1 tablet (50 mg total) by mouth every 6 (six) hours as needed for severe pain. For AFTER surgery, do not take and drive 30 tablet 0   No current facility-administered medications for this visit.    SUMMARY OF ONCOLOGIC HISTORY: Oncology History  Malignant germ cell tumor of ovary ([place])  12/01/2021 Tumor Marker   CA-125 is elevated at 285, alpha-fet[place] [address], beta-hCG is elevated at 116   12/02/2021 Imaging   MRI pelvis  1. 21.1 by 10.9 by 18.7 cm abdominopelvic mass, with a satellite mass in the right upper quadrant anteriorly, and moderate ascites with tumor deposits along the pelvic ascites posteriorly. The mass could be arising from right or left ovary, or both. The mass displaces the bowel and IVC, but there is no IVC thrombus or pelvic DVT identified. 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 2. Additional satellite mass above the liver is partially included, along with tumor deposits along the ascites. 3. Low-level edema along the lateral abdominal wall musculature. 4. Flattening of the IVC at the level of the aortic bifurcation due to the large abdominopelvic tumor. No findings of pelvic DVT.     12/07/2021 Imaging   CT abdomen and pelvis 1. Large central abdominal  and pelvic mass consistent with known malignancy. This is incompletely visualized by this CT of the abdomen, but was seen on recent pelvic MRI. 2. 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 Right omental implant consistent with metastatic disease. Small amount of abdominal ascites. 3. No evidence for bowel or ureteral obstruction. 4. Aortic Atherosclerosis (ICD10-I70.0). 5. Chest findings dictated separately.   12/07/2021 Imaging   CT chest 1. Negative for pulmonary embolism. And no acute or metastatic process identified in the Chest. 2. Partially visible liver mass and ascites, staging CT Abdomen today is reported separately.     12/07/2021 Procedure   Successful placement of a right internal jugular approach power injectable Port-A-Cath. The catheter is ready for immediate use.   12/07/2021 Procedure   Ultrasound-guided biopsy of right upper quadrant mass.    12/08/2021 Initial Diagnosis   Ovarian cancer ([place])   12/08/2021 Cancer Staging   Staging form: Ovary, Fallopian Tube, and Primary Peritoneal Carcinoma, AJCC 8th Edition - Clinical stage from 12/08/2021: FIGO Stage IIIC (cT3c, cN0, cM0) - Signed by Heath Lark, MD on 12/10/2021 Stage prefix: Initial diagnosis   12/20/2021 - 12/28/2021 Chemotherapy   Patient is on Treatment Plan : Ovarian germ cell tumor q21d x 3 Cycles  01/03/2022 Imaging   IMPRESSION: 1. Large central abdominopelvic mass compatible with malignancy, 3450 cubic cm in volume. This may be arising from the ovaries with slight eccentricity to the right in the pelvis. 2. Mild increase in size of the right omental tumor deposit. 3. Mild increase in size of the subcapsular mass along the dome of the right hepatic lobe, with suspected hepatic invasion. 4. Scattered malignant ascites. 5. No dilated bowel to suggest obstruction. 6. 2 mm left kidney lower pole nonobstructive renal  calculus. 7. Mild abdominal aortic atherosclerotic vascular disease.   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  Postoperative Diagnosis: Status post R0 cytoreductive surgery and heating intraperitoneal chemotherapy  Procedures: LAPAROSCOPY, ABDOMEN, PERITONEUM, & OMENTUM, DIAGNOSTIC, W/WO COLLECTION SPECIMEN(S) BY BRUSHING OR WASHING  EXCISION/DESTRUCTION, OPEN, INTRA-ABD TUMOR/CYST/ENDOMETRIOMA, 1+ PERITONEAL/MESENTERI/RETROPERIT 5CM OR <COLECTOMY, PARTIAL; WITH ANASTOMOSIS COLOSTOMY OR SKIN LEVEL CECOSTOMY SPLENECTOMY; TOT (SEPART PROC) EXPLORATORY LAPAROTOMY, EXPLORATORY CELIOTOMY WITH OR WITHOUT BIOPSY(S) HYPERTHERMIA, EXTERNALLY GENERATED; DEEP CHEMOTHERAPY ADMINISTRATION INTO THE PERITONEAL CAVITY VIA INDWELLING PORT OR CATHETER RESECTION (INITIAL) OVARIAN, TUBAL/PRIM PERITONEAL MALIG W/BIL S&O/OMENTECT; W/RAD DISSECTION FOR DEBULKING CHOLECYSTECTOMY APPENDECTOMY OMENTECTOMY, EPIPLOECTOMY, RESECTION OF OMENTUM Procedures: Exploratory laparotomy, total abdominal hysterectomy with radical dissection, bilateral ureterolysis, bilateral salpingo-oophorectomy, posterior cul-de-sac peritonectomy, tumor debulking by Dr Berline Lopes with Gynecology Oncology Dr [place] Scrape with surgical oncology: Diagnostic laparoscopy, exploratory laparotomy, low anterior resection with primary end-to-end anastomosis, enterolysis, cholecystectomy, tumor capsule tumor debulking, splenectomy, appendectomy, omentectomy, diverting loop ileostomy formation  Surgeon: Jeral Pinch, MD  Findings: On diagnostic laparoscopy, only able to clearly visualize the left upper quadrant. Adhesions of bowel in the right upper quadrant limiting survey, minimal ascites noted in the pelvis, scattered tumor plaques visualized on left diaphragm. Given these findings, the decision was made to convert to exploratory laparotomy. Multiple loops of small bowel  encased in a Cancn like structure in the midline with multiple filmy adhesions tethering to the superior aspect of the a large pelvic mass. Multicystic and necrotic pelvic mass originating from left ovary, approximately 18 x 20 cm incorporated within the left broad ligament with the left tube draped overlying the left ovarian mass. Bilateral tubes normal in appearance, normal-appearing uterus with smooth serosa. Right adnexa similarly with approximate 10 x 8 multicystic and necrotic mass. Diffuse tumor plaques encompassing posterior cul-de-sac peritoneum. Rectosigmoid densely adhered to posterior aspect of left adnexal mass requiring dissection and ultimately resection. Scattered treated subcentimeter nodules along small bowel mesentery. 5 x 5 cm necrotic and multicystic tumor at the level of the hepatic flexure. Enlarged similar multicystic necrotic tumor adherent between the right diaphragm and the liver without any evidence of liver invasion and tumor encased solely within the liver capsule. Multiple tumor nodules along the spleen requiring splenectomy. Omentum overall fairly normal in appearance with only scattered small tumor deposits, no omental cake was encountered. Please see surgical oncology operative note for further details regarding operative findings for their portion of the procedure.  Specimens: ID Type Source Tests Collected by Time Destination 1 : Abdominal fat pad Tissue Abdomen SURGICAL PATHOLOGY EXAM Marthann Schiller, MD 02/15/2022 [phone] 2 : Left adnexa Tissue Abdomen SURGICAL PATHOLOGY EXAM Marthann Schiller, MD 02/15/2022 [phone] : right adnexa Tissue Abdomen SURGICAL PATHOLOGY EXAM Marthann Schiller, MD 02/15/2022 [phone] : Left perimetrium Tissue Uterus SURGICAL PATHOLOGY EXAM Lafonda Mosses, MD 02/15/2022 [phone] 5 : Uterus and cervix Tissue Uterus SURGICAL PATHOLOGY Iantha Fallen, MD 02/15/2022  1018 6 : Low Anterior Resection Tissue Abdomen SURGICAL  PATHOLOGY EXAM Marthann Schiller, MD 02/15/2022 1055 7 : Right peritoneum Tissue Abdomen SURGICAL PATHOLOGY EXAM Marthann Schiller, MD 02/15/2022 1058 8 : Right retroperitoneal mass Tissue Abdomen SURGICAL PATHOLOGY EXAM Marthann Schiller, MD 02/15/2022 1101 9 : Omentum Tissue Omentum SURGICAL PATHOLOGY EXAM Marthann Schiller, MD 02/15/2022 1130 10 : Gallbladder Tissue Gallbladder SURGICAL PATHOLOGY EXAM Marthann Schiller, MD 02/15/2022 1143 11 : Right diaphragm Tissue Diaphragm SURGICAL PATHOLOGY EXAM Marthann Schiller, MD 02/15/2022 1143 12 : Left diaphragm nodule Tissue Diaphragm SURGICAL PATHOLOGY EXAM Marthann Schiller, MD 02/15/2022 1157 13 : Spleen Tissue Spleen SURGICAL PATHOLOGY EXAM Marthann Schiller, MD 02/15/2022 1204 14 : Appendix Tissue Appendix SURGICAL PATHOLOGY EXAM Marthann Schiller, MD 02/15/2022 1213 15 : Small bowel mesenteric nodules Tissue Small Bowel SURGICAL PATHOLOGY EXAM Marthann Schiller, MD 02/15/2022 1215 16 : Colon mesenteric nodule Tissue Colon SURGICAL PATHOLOGY EXAM Marthann Schiller, MD 02/15/2022 1221 17 : Hepatic flexure mass Tissue Liver SURGICAL PATHOLOGY EXAM Marthann Schiller, MD 05/17/5954 [id number]  Complications: None  Indications for Procedure: [name] is a 52 y.o. 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 Prior to the procedure, all risks, benefits, and alternatives were discussed and informed surgical consent was signed.  Procedure: Patient was taken to the operating room where general anesthesia was achieved. She was positioned in dorsal lithotomy and prepped and draped per the surgical oncology team. A foley catheter was inserted into the bladder. A 5 mm skin incision was made in the left upper quadrant at Palmer's point, the abdomen was then entered with direct Optiview entry and the abdomen was  insufflated. Findings were noted as above.  The laparoscope was then removed from the abdomen, the pneumoperitoneum was maintained. A vertical midline incision was made with the scalpel and the abdomen was entered sharply. The abdomen and pelvis were surveyed with findings as documented above. A wound protector was placed followed by the [facility] retractor.  The enlarged left adnexal mass was then carefully removed of its bilateral sidewall attachments using a mixture of blunt and cautery dissection. Multiple small bowel loops were adhered to the superior and posterior aspect of the mass with filmy adhesions which were carefully bluntly resected. The rectosigmoid was noted to be densely adhered to the posterior aspect of the pelvic mass. Dissection was attempted using mixture of blunt dissection as well as sharp dissection with the Metzenbaum scissors, and unavoidable enterotomy of approximately 4 cm was made to the anterior surface of the rectosigmoid. This enterotomy was then closed with 4-0 PDS in a running fashion to minimize contamination. Further dissection removing the remainder of the rectosigmoid from the posterior aspect of the pelvic mass was then undertaken. The pelvic mass was then able to be fully elevated from the pelvis. The left infundibulopelvic ligament was able to be isolated with the left ureter clearly visualized below. The left ureter was further dissected away from the medial leaf of the broad ligament, and a vessel loop was placed. The left infundibulopelvic ligament was then isolated, clamped, and transected using the LigaSure device as well as and suture-ligated. The left utero-ovarian ligament was then isolated, clamped, cauterized and transected. The left pelvic mass was then removed entirely.  Attention was then turned to the right. An additional enlarged multicystic right adnexal mass was noted, this was carefully elevated from the  pelvis using blunt dissection. The right round  ligament was then grasped, elevated, and transected. The broad ligament was then opened posteriorly. The ureter was identified and the right infundibulopelvic ligament was then isolated, clamped, transected, and tied. The right utero ovarian ligament was then isolated, clamped, and transected using the LigaSure device and the right adnexal mass was removed.  Attention then was turned toward the hysterectomy. A sponge stick was placed within the vagina. The right broad ligament dissection was opened anteriorly and the bladder flap was developed. The left round ligament was then further transected, and the broad ligament opened posteriorly and anteriorly to complete the bladder flap. The right uterine artery was skeletonized, clamped, transected, and suture-ligated. There was significant parametrial tumor involvement on the left side, and therefore a radical dissection was required to isolate the blood supply. The left ureter was further dissected down to the level near the trigone. The left uterine artery was traced to its origin, isolated, and clamped with vascular clips. Similarly the left uterine vein due to significant necrotic tumor involvement was friable. Detachment was similarly carefully isolated, and clipped with vascular clips. Sequential clamps were then used to transect the remainder of the broad and cardinal ligaments bilaterally, with each pedicle being suture-ligated. A large right angle clamp was then able to be placed below the cervix, and the uterus and cervix were amputated from the vagina. The vaginal cuff was then closed with several figure-of-eight sutures using 0 Vicryl.    02/15/2022 Pathology Results   A: Abdominal fat pad, excision - Fibroadipose tissue with residual mixed malignant germ cell tumor with treatment effect   B: Left adnexa, salpingo-oophorectomy - Residual mixed malignant germ cell tumor with treatment effect, [id number] (See Comment and Synoptic report) - FIGO stage  IIIC - Fallopian tubes with no tumor present   C: Right adnexa, salpingo-oophorectomy - Residual mixed malignant germ cell tumor with treatment effect (See Synoptic report) - Fallopian tubes not identified   D: Left parametrium, biopsy - Fibroadipose tissue with residual mixed malignant germ cell tumor with treatment effect   E: Uterus and cervix, hysterectomy - Right adnexal soft tissue with residual mixed malignant germ cell tumor with treatment effect   Myometrium: - Adenomyosis - Leiomyomata measuring up to 0.7 cm - Extensive chronic serositis   Cervix:  - Ectocervix and endocervix with chronic cervicitis    Endometrium: - Inactive endometrium   F: Low anterior resection - Residual mixed malignant  germ cell tumor with treatment effect involving colonic serosa and pericolonic adipose tissue - Proximal and distal colonic resection margins with chronic serositis, but negative for tumor    G: Right peritoneum, biopsy - Fibroadipose tissue with residual mixed malignant germ cell tumor with treatment effect   H: Right retroperitoneal mass, excision - Fibroadipose tissue with residual mixed malignant germ cell tumor with treatment effect   I: Omentum, omentectomy - Fibroadipose tissue with residual mixed malignant germ cell tumor with treatment effect   J: Gallbladder, cholecystectomy - Gallbladder with mild chronic serositis and fibrous adhesion   K: Right diaphragm, excision - Fibrous tissue with residual mixed malignant germ cell tumor with treatment effect - Hepatic tissue with no tumor present   L: Left diaphragm nodule, biopsy - Fibrous tissue with residual mixed malignant germ cell tumor with treatment effect   M: Spleen, splenectomy - Residual mixed malignant germ cell tumor with treatment effect involving splenic capsule   N: Appendix, appendectomy - Appendix with fibrous obliteration at the tip - Periappendiceal  adipose tissue with acute and chronic  inflammation   O: Small bowel mesenteric nodules, excision - Fibroadipose tissue with residual mixed malignant germ cell tumor with treatment effect   P: Mesenteric nodules, excision - Fibroadipose tissue with residual mixed malignant germ cell tumor with treatment effect   Q: Hepatic flexure mass, excision - Fibroadipose tissue with residual mixed malignant germ cell tumor with treatment effect   R: Anvil and donut, biopsy - Benign segment of colon with acute and chronic inflammation and fibrous adhesion  SPECIMEN    Procedure:    Radical hysterectomy     Procedure:    Omentectomy     Procedure:    Peritoneal tumor debulking     Hysterectomy Type:    Abdominal     Specimen Integrity:          Right Ovary Integrity:    Capsule intact     Specimen Integrity:          Left Ovary Integrity:    Capsule intact   TUMOR    Tumor Site:    Bilateral ovaries     Tumor Size:    Greatest Dimension (Centimeters): 19.5 cm     Histologic Type:    Mixed malignant germ cell tumor:  yolk sac tumor, mature teratoma, mature neural elements, immature neural tissue, and immature cartilage     Ovarian Surface Involvement:    Present, right and left     Other Tissue / Organ Involvement:    Right ovary     Other Tissue / Organ Involvement:    Left ovary     Other Tissue / Organ Involvement:    Right fallopian tube     Other Tissue / Organ Involvement:    Omentum     Other Tissue / Organ Involvement:    Diaphragm and splenic capsule     Largest Extrapelvic Peritoneal Focus:    Macroscopic (greater than 2 cm): Splenic capsule     Peritoneal / Ascitic Fluid Involvement:    Not submitted / unknown   REGIONAL LYMPH NODES     Regional Lymph Node Status:    Not applicable (no regional lymph nodes submitted or found)   DISTANT METASTASIS     Distant Site(s) Involved:    Diaphragm, splenic capsule   pTNM CLASSIFICATION (AJCC 8th Edition)     Reporting of pT, pN, and (when applicable) pM categories is based  on information available to the pathologist at the time the report is issued. As per the AJCC (Chapter 1, 8th Ed.) it is the managing physician's responsibility to establish the final pathologic stage based upon all pertinent information, including but potentially not limited to this pathology report.     Modified Classification:    y     pT Category:    pT3c     pN Category:    pN not assigned (no nodes submitted or found)     pM Category:    pM1   FIGO STAGE     FIGO Stage:    IIIC   COMMENT:  The needle core biopsies show a heterogeneous combination of elements including abundant neural tissue with atypical and immature features. There are also atypical glandular epithelial structures and focal yolk sac elements with microcystic pattern.  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  Immunohistochemistry is performed for cytokeratin AE1/AE3, CD117, CD30, G FAP, glypican-3, OCT3/4, synaptophysin, S100 and CD30.  03/14/2022 -  Chemotherapy   Patient is on Treatment Plan : TESTICULAR EP D1-5 q21d       PHYSICAL EXAMINATION: ECOG PERFORMANCE STATUS: 1 - Symptomatic but completely ambulatory  Vitals:   03/10/22 1353  BP: 98/75  Pulse: 99  Resp: 18  Temp: 98.4 F (36.9 C)  SpO2: 100%   Filed Weights   03/10/22 1353  Weight: 96 lb 12.8 oz (43.9 kg)    GENERAL:alert, no distress and comfortable SKIN: skin color, texture, turgor are normal, no rashes or significant lesions EYES: normal, Conjunctiva are pink and non-injected, sclera clear OROPHARYNX:no exudate, no erythema and lips, buccal mucosa, and tongue normal  NECK: supple, thyroid normal size, non-tender, without nodularity LYMPH:  no palpable lymphadenopathy in the cervical, axillary or inguinal LUNGS: clear to auscultation and percussion with normal breathing effort HEART: regular rate & rhythm and no murmurs and no lower extremity  edema ABDOMEN:abdomen soft, non-tender and normal bowel sounds.  Noted well-healed surgical scar.  Good functioning ostomy Musculoskeletal:no cyanosis of digits and no clubbing  NEURO: alert & oriented x 3 with fluent speech, no focal motor/sensory deficits  LABORATORY DATA:  I have reviewed the data as listed    Component Value Date/Time   NA 142 03/10/2022 1323   K 4.2 03/10/2022 1323   CL 104 03/10/2022 1323   CO2 32 03/10/2022 1323   GLUCOSE 88 03/10/2022 1323   BUN 13 03/10/2022 1323   CREATININE 0.58 03/10/2022 1323   CREATININE 0.75 03/23/2011 1550   CALCIUM 9.5 03/10/2022 1323   PROT 6.9 03/10/2022 1323   ALBUMIN 3.7 03/10/2022 1323   AST 24 03/10/2022 1323   ALT 34 03/10/2022 1323   ALKPHOS 83 03/10/2022 1323   BILITOT 0.3 03/10/2022 1323   GFRNONAA >60 03/10/2022 1323    No results found for: "SPEP", "UPEP"  Lab Results  Component Value Date   WBC 13.8 (H) 03/10/2022   NEUTROABS 10.1 (H) 03/10/2022   HGB 11.3 (L) 03/10/2022   HCT 35.0 (L) 03/10/2022   MCV 88.8 03/10/2022   PLT 582 (H) 03/10/2022      Chemistry      Component Value Date/Time   NA 142 03/10/2022 1323   K 4.2 03/10/2022 1323   CL 104 03/10/2022 1323   CO2 32 03/10/2022 1323   BUN 13 03/10/2022 1323   CREATININE 0.58 03/10/2022 1323   CREATININE 0.75 03/23/2011 1550      Component Value Date/Time   CALCIUM 9.5 03/10/2022 1323   ALKPHOS 83 03/10/2022 1323   AST 24 03/10/2022 1323   ALT 34 03/10/2022 1323   BILITOT 0.3 03/10/2022 1323

## 2022-03-10 NOTE — Assessment & Plan Note (Signed)
Her anemia is improving She has received numerous blood transfusions in the past I will monitor her blood count when she returns the week after chemotherapy in case she needs blood

## 2022-03-11 ENCOUNTER — Ambulatory Visit: Admit: 2022-03-11 | Discharge: 2022-03-12 | Payer: PRIVATE HEALTH INSURANCE

## 2022-03-11 ENCOUNTER — Other Ambulatory Visit: Payer: Self-pay

## 2022-03-11 DIAGNOSIS — E441 Mild protein-calorie malnutrition: Secondary | ICD-10-CM

## 2022-03-11 DIAGNOSIS — C569 Malignant neoplasm of unspecified ovary: Secondary | ICD-10-CM

## 2022-03-11 MED FILL — Dexamethasone Sodium Phosphate Inj 100 MG/10ML: INTRAMUSCULAR | Qty: 1 | Status: AC

## 2022-03-11 MED FILL — Fosaprepitant Dimeglumine For IV Infusion 150 MG (Base Eq): INTRAVENOUS | Qty: 5 | Status: AC

## 2022-03-11 NOTE — Unmapped (Signed)
Patient Name: Debra Ponce  Patient Age: 52 y.o.  Encounter Date: 03/11/2022    Attending Physician:  Tora Duck, MD, MPH    Referring Physician:   None Per Patient Pcp    Primary Care Provider:  Paul Dykes, MD    CONSULTING PHYSICIANS:  Patient Care Team:  Paul Dykes, MD as PCP - General  Thalia Party, RN as Registered Nurse (Oncology Navigator)  Marion Downer, MD as Surgeon (Surgical Oncology)  Jill Poling, Berline Chough, NP as Nurse Practitioner (Surgical Oncology)    Diagnosis:  Malignant germ cell tumor of ovary     Follow Up Note:    Debra Ponce is a 52 y.o. female who is seen here for postoperative follow-up from CRS and HIPEC.  She underwent an exploratory laparotomy, low anterior resection with diverting loop ileostomy, TAH and BSO, splenectomy, cholecystectomy, and appendectomy and received IP cisplatin intraoperatively on 02/15/22.    Patient is doing very well on her visit today, with main concerns being when she can begin exercising submerging her wounds. Patient feels her pain is controlled with occasional tramadol and Lyrica at night, and is tolerating a regular diet well without nausea or vomiting.  Patient occasionally gets short of breath with activity, but is able to ambulate around the house without issues.  Patient will states she is having some right upper back pain, that appears musculoskeletal in nature that is improving. Patient states she is 1000x better than before surgery.  Patient had some leakage around her new ostomy with some skin breakdown, and was referred to [facility] health [facility]N has since resolved for leaking.  Patient was previously having around 600cc output per day, applesauce consistency.    Patient was recently seen by gyn/onc without concerns, and is planning to begin chemotherapy on Monday.    Vital Signs for this encounter:  BSA: 4.29 meters squared  BP 109/77  - Pulse 92  - Temp 36.6 ??C (97.9 ??F) (Temporal)  - Resp 16  - Ht 149.9 cm (4' 11)  - Wt (!) 441.3 kg (973 lb)  - SpO2 99%  - BMI 196.52 kg/m??   Wt Readings from Last 3 Encounters:   03/11/22 (!) 441.3 kg (973 lb)   02/17/22 56 kg (123 lb 7.3 oz)   02/04/22 49.4 kg (109 lb)       General Appearance:  No acute distress, well appearing and well nourished. A&0 x 3.   Eyes:  Conjuctiva and lids appear normal. Pupils equal and round, sclera anicteric.   Pulmonary:    Normal respiratory effort.  Lungs were clear to auscultation bilaterally.   Cardiovascular:  Regular rate and rhythm, no murmur noted.  No thrill noted.   Abdomen:   Soft, non-tender, without masses.  No hepatosplenomegaly. No hernias appreciated. Normoactive bowel sounds. Incisions w/ residual dermabond, well healed without erythema, induration, or separation of wounds. Ostomy bag in place w/ stool/gas, ostomy pink w/o prolapse.    Musculoskeletal: Normal gait.  Extremities without clubbing, cyanosis, or edema.   Neurologic:  Lymphatic: No motor abnormalities noted.  Sensation grossly intact.  No cervical or supraclavicular LAD noted.     Diagnostic Studies:  Diagnosis   Date Value Ref Range Status   02/15/2022   Final    A: Abdominal fat pad, excision  - Fibroadipose tissue with residual mixed malignant germ cell tumor with treatment effect    B: Left adnexa, salpingo-oophorectomy  - Residual mixed malignant germ cell tumor with treatment effect, [id number] (See  Comment and Synoptic report)  - FIGO stage IIIC  - Fallopian tubes with no tumor present    C: Right adnexa, salpingo-oophorectomy  - Residual mixed malignant germ cell tumor with treatment effect (See Synoptic report)  - Fallopian tubes not identified    D: Left parametrium, biopsy  - Fibroadipose tissue with residual mixed malignant germ cell tumor with treatment effect    E: Uterus and cervix, hysterectomy  - Right adnexal soft tissue with residual mixed malignant germ cell tumor with treatment effect    Myometrium:  - Adenomyosis  - Leiomyomata measuring up to 0.7 cm  - Extensive chronic serositis    Cervix:   - Ectocervix and endocervix with chronic cervicitis     Endometrium:  - Inactive endometrium    F: Low anterior resection  - Residual mixed malignant  germ cell tumor with treatment effect involving colonic serosa and pericolonic adipose tissue  - Proximal and distal colonic resection margins with chronic serositis, but negative for tumor     G: Right peritoneum, biopsy  - Fibroadipose tissue with residual mixed malignant germ cell tumor with treatment effect    H: Right retroperitoneal mass, excision  - Fibroadipose tissue with residual mixed malignant germ cell tumor with treatment effect    I: Omentum, omentectomy  - Fibroadipose tissue with residual mixed malignant germ cell tumor with treatment effect    J: Gallbladder, cholecystectomy  - Gallbladder with mild chronic serositis and fibrous adhesion    K: Right diaphragm, excision  - Fibrous tissue with residual mixed malignant germ cell tumor with treatment effect  - Hepatic tissue with no tumor present    L: Left diaphragm nodule, biopsy  - Fibrous tissue with residual mixed malignant germ cell tumor with treatment effect    M: Spleen, splenectomy  - Residual mixed malignant germ cell tumor with treatment effect involving splenic capsule    N: Appendix, appendectomy  - Appendix with fibrous obliteration at the tip  - Periappendiceal adipose tissue with acute and chronic inflammation    O: Small bowel mesenteric nodules, excision  - Fibroadipose tissue with residual mixed malignant germ cell tumor with treatment effect    P: Mesenteric nodules, excision  - Fibroadipose tissue with residual mixed malignant germ cell tumor with treatment effect    Q: Hepatic flexure mass, excision  - Fibroadipose tissue with residual mixed malignant germ cell tumor with treatment effect    R: Anvil and donut, biopsy  - Benign segment of colon with acute and chronic inflammation and fibrous adhesion    This electronic signature is attestation that the pathologist personally reviewed the submitted material(s) and the final diagnosis reflects that evaluation.           Assessment: Debra Ponce is 52 y.o. 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 She underwent an exploratory laparotomy, low anterior resection with diverting loop ileostomy, TAH and BSO, splenectomy, cholecystectomy, and appendectomy and received IP cisplatin intraoperatively on 02/15/22. She presents today in good health with no acute complaints/concerns.    Plan: She was seen and examined today.     Patient presenting today for follow-up in good health.  Patient can participate in her yoga class as tolerated, and can submerge her wounds in bath water after 4 weeks from her procedure or starting February.  Patient will plan to continue the rest of her care with her gyn/onc medical team.  She will be concluding her surgical care  with [place] at this time.  Patient was notified that if she had any further questions or concerns she can contact our office at any time.  Please have your 8-week booster splenic vaccinations done, either at your PCP, next gyn-onc appointment, or at our clinic if you prefer.    Our contact information was given for any further questions or concerns.    Tora Duck, MD, MPH was present for the evaluation and is in agreement with the plan above.    Earnest Rosier, MD  PGY-1, General Surgery

## 2022-03-14 ENCOUNTER — Inpatient Hospital Stay: Payer: 59

## 2022-03-14 ENCOUNTER — Other Ambulatory Visit: Payer: Self-pay

## 2022-03-14 ENCOUNTER — Other Ambulatory Visit (HOSPITAL_COMMUNITY): Payer: Self-pay | Admitting: Nurse Practitioner

## 2022-03-14 ENCOUNTER — Encounter: Payer: Self-pay | Admitting: General Practice

## 2022-03-14 ENCOUNTER — Other Ambulatory Visit (HOSPITAL_COMMUNITY): Payer: Self-pay

## 2022-03-14 VITALS — BP 115/70 | HR 97 | Temp 97.7°F | Resp 16 | Wt 97.5 lb

## 2022-03-14 DIAGNOSIS — L24B3 Irritant contact dermatitis related to fecal or urinary stoma or fistula: Secondary | ICD-10-CM

## 2022-03-14 DIAGNOSIS — Z432 Encounter for attention to ileostomy: Secondary | ICD-10-CM

## 2022-03-14 DIAGNOSIS — C569 Malignant neoplasm of unspecified ovary: Secondary | ICD-10-CM

## 2022-03-14 DIAGNOSIS — Z5111 Encounter for antineoplastic chemotherapy: Secondary | ICD-10-CM | POA: Diagnosis not present

## 2022-03-14 MED ORDER — SODIUM CHLORIDE 0.9 % IV SOLN
10.0000 mg | Freq: Once | INTRAVENOUS | Status: AC
  Administered 2022-03-14: 10 mg via INTRAVENOUS
  Filled 2022-03-14: qty 10

## 2022-03-14 MED ORDER — POTASSIUM CHLORIDE IN NACL 20-0.9 MEQ/L-% IV SOLN
Freq: Once | INTRAVENOUS | Status: AC
  Filled 2022-03-14: qty 1000

## 2022-03-14 MED ORDER — SODIUM CHLORIDE 0.9 % IV SOLN: Freq: Once | INTRAVENOUS | Status: AC

## 2022-03-14 MED ORDER — SODIUM CHLORIDE 0.9 % IV SOLN
100.0000 mg/m2 | Freq: Once | INTRAVENOUS | Status: AC
  Administered 2022-03-14: 130 mg via INTRAVENOUS
  Filled 2022-03-14: qty 6.5

## 2022-03-14 MED ORDER — PALONOSETRON HCL INJECTION 0.25 MG/5ML
0.2500 mg | Freq: Once | INTRAVENOUS | Status: AC
  Administered 2022-03-14: 0.25 mg via INTRAVENOUS
  Filled 2022-03-14: qty 5

## 2022-03-14 MED ORDER — HEPARIN SOD (PORK) LOCK FLUSH 100 UNIT/ML IV SOLN
500.0000 [IU] | Freq: Once | INTRAVENOUS | Status: AC | PRN
  Administered 2022-03-14: 500 [IU]

## 2022-03-14 MED ORDER — SODIUM CHLORIDE 0.9 % IV SOLN
150.0000 mg | Freq: Once | INTRAVENOUS | Status: AC
  Administered 2022-03-14: 150 mg via INTRAVENOUS
  Filled 2022-03-14: qty 150

## 2022-03-14 MED ORDER — SODIUM CHLORIDE 0.9% FLUSH
10.0000 mL | INTRAVENOUS | Status: DC | PRN
  Administered 2022-03-14: 10 mL

## 2022-03-14 MED ORDER — SODIUM CHLORIDE 0.9 % IV SOLN
15.0000 mg/m2 | Freq: Once | INTRAVENOUS | Status: AC
  Administered 2022-03-14: 20 mg via INTRAVENOUS
  Filled 2022-03-14: qty 20

## 2022-03-14 MED ORDER — MAGNESIUM SULFATE 2 GM/50ML IV SOLN
2.0000 g | Freq: Once | INTRAVENOUS | Status: AC
  Administered 2022-03-14: 2 g via INTRAVENOUS
  Filled 2022-03-14: qty 50

## 2022-03-14 MED FILL — Dexamethasone Sodium Phosphate Inj 100 MG/10ML: INTRAMUSCULAR | Qty: 1 | Status: AC

## 2022-03-14 NOTE — Patient Instructions (Signed)
[facility]  Discharge Instructions: Thank you for choosing [facility] to provide your oncology and hematology care.   If you have a lab appointment with the [facility], please go directly to the [facility] and check in at the registration area.   Wear comfortable clothing and clothing appropriate for easy access to any Portacath or PICC line.   We strive to give you quality time with your provider. You may need to reschedule your appointment if you arrive late (15 or more minutes).  Arriving late affects you and other patients whose appointments are after yours.  Also, if you miss three or more appointments without notifying the office, you may be dismissed from the clinic at the provider's discretion.      For prescription refill requests, have your pharmacy contact our office and allow 72 hours for refills to be completed.    Today you received the following chemotherapy and/or immunotherapy agents: cisplatin and etoposide      To help prevent nausea and vomiting after your treatment, we encourage you to take your nausea medication as directed.  BELOW ARE SYMPTOMS THAT SHOULD BE REPORTED IMMEDIATELY: *FEVER GREATER THAN 100.4 F (38 C) OR HIGHER *CHILLS OR SWEATING *NAUSEA AND VOMITING THAT IS NOT CONTROLLED WITH YOUR NAUSEA MEDICATION *UNUSUAL SHORTNESS OF BREATH *UNUSUAL BRUISING OR BLEEDING *URINARY PROBLEMS (pain or burning when urinating, or frequent urination) *BOWEL PROBLEMS (unusual diarrhea, constipation, pain near the anus) TENDERNESS IN MOUTH AND THROAT WITH OR WITHOUT PRESENCE OF ULCERS (sore throat, sores in mouth, or a toothache) UNUSUAL RASH, SWELLING OR PAIN  UNUSUAL VAGINAL DISCHARGE OR ITCHING   Items with * indicate a potential emergency and should be followed up as soon as possible or go to the Emergency Department if any problems should occur.  Please show the CHEMOTHERAPY ALERT CARD or IMMUNOTHERAPY ALERT  CARD at check-in to the Emergency Department and triage nurse.  Should you have questions after your visit or need to cancel or reschedule your appointment, please contact [facility]  Dept: [phone]  and follow the prompts.  Office hours are 8:00 a.m. to 4:30 p.m. Monday - Friday. Please note that voicemails left after 4:00 p.m. may not be returned until the following business day.  We are closed weekends and major holidays. You have access to a nurse at all times for urgent questions. Please call the main number to the clinic Dept: [phone] and follow the prompts.   For any non-urgent questions, you may also contact your provider using MyChart. We now offer e-Visits for anyone 37 and older to request care online for non-urgent symptoms. For details visit [email or web address].   Also download the MyChart app! Go to the app store, search "MyChart", open the app, select [facility], and log in with your MyChart username and password.

## 2022-03-14 NOTE — Progress Notes (Signed)
[facility] Spiritual Care Note  Following again now that Chonte has resumed care at [facility].   [place], [place], [facility] Pager [phone] Voicemail [phone]

## 2022-03-15 ENCOUNTER — Inpatient Hospital Stay: Payer: 59

## 2022-03-15 ENCOUNTER — Inpatient Hospital Stay: Payer: 59 | Admitting: Dietician

## 2022-03-15 ENCOUNTER — Other Ambulatory Visit: Payer: Self-pay | Admitting: Hematology and Oncology

## 2022-03-15 VITALS — BP 107/66 | HR 86 | Temp 97.8°F | Resp 16

## 2022-03-15 DIAGNOSIS — C569 Malignant neoplasm of unspecified ovary: Secondary | ICD-10-CM

## 2022-03-15 DIAGNOSIS — Z5111 Encounter for antineoplastic chemotherapy: Secondary | ICD-10-CM | POA: Diagnosis not present

## 2022-03-15 MED ORDER — SODIUM CHLORIDE 0.9% FLUSH
10.0000 mL | INTRAVENOUS | Status: DC | PRN
  Administered 2022-03-15: 10 mL

## 2022-03-15 MED ORDER — SODIUM CHLORIDE 0.9 % IV SOLN: Freq: Once | INTRAVENOUS | Status: AC

## 2022-03-15 MED ORDER — MAGNESIUM SULFATE 2 GM/50ML IV SOLN
2.0000 g | Freq: Once | INTRAVENOUS | Status: AC
  Administered 2022-03-15: 2 g via INTRAVENOUS
  Filled 2022-03-15: qty 50

## 2022-03-15 MED ORDER — HEPARIN SOD (PORK) LOCK FLUSH 100 UNIT/ML IV SOLN
500.0000 [IU] | Freq: Once | INTRAVENOUS | Status: AC | PRN
  Administered 2022-03-15: 500 [IU]

## 2022-03-15 MED ORDER — SODIUM CHLORIDE 0.9 % IV SOLN
15.0000 mg/m2 | Freq: Once | INTRAVENOUS | Status: AC
  Administered 2022-03-15: 20 mg via INTRAVENOUS
  Filled 2022-03-15: qty 20

## 2022-03-15 MED ORDER — POTASSIUM CHLORIDE IN NACL 20-0.9 MEQ/L-% IV SOLN
Freq: Once | INTRAVENOUS | Status: AC
  Filled 2022-03-15: qty 1000

## 2022-03-15 MED ORDER — SODIUM CHLORIDE 0.9 % IV SOLN
100.0000 mg/m2 | Freq: Once | INTRAVENOUS | Status: AC
  Administered 2022-03-15: 130 mg via INTRAVENOUS
  Filled 2022-03-15: qty 6.5

## 2022-03-15 MED ORDER — SODIUM CHLORIDE 0.9 % IV SOLN
10.0000 mg | Freq: Once | INTRAVENOUS | Status: AC
  Administered 2022-03-15: 10 mg via INTRAVENOUS
  Filled 2022-03-15: qty 10

## 2022-03-15 MED FILL — Dexamethasone Sodium Phosphate Inj 100 MG/10ML: INTRAMUSCULAR | Qty: 1 | Status: AC

## 2022-03-15 MED FILL — Fosaprepitant Dimeglumine For IV Infusion 150 MG (Base Eq): INTRAVENOUS | Qty: 5 | Status: AC

## 2022-03-15 NOTE — Progress Notes (Signed)
Nutrition Assessment   Reason for Assessment: Referral (wt loss)   ASSESSMENT: 52 year old female with ovarian cancer. She completed neoadjuvant chemotherapy followed by debulking surgery  and HIPEC (02/15/22) under the care of Dr. Berline Lopes. Patient is receiving adjuvant dose reduced cisplatin + etoposide q21d. Patient is under the care of Dr. Alvy Bimler.   Past medical history includes hearing loss, ileostomy stenosis, Luetscher's syndrome, GAD  Met with patient in infusion. She reports appetite has improved and eating well s/p surgery and break from chemotherapy. Patient expecting ~one week of nausea and poor appetite after treatment as experienced prior to surgery. For the last few weeks, patient recalls 3 good meals + snacks in between. She is including good sources of protein (chicken, full fat yogurt, cheese, whole milk, humus). Patient is not drinking Ensure/Boost secondary to artifical sweetener and taste. Patient with ileostomy in place. She is drinking lots of fluids (coconut water, milk, orange juice, mango juice, water). Patient recalls normal output 600-700 ml daily.    Nutrition Focused Physical Exam: deferred   Medications: eliquis, D3, decadron, pepcid, ativan, zofran, lyrica, compazine, tramadol, mylicon   Labs: reviewed    Anthropometrics: Weights are trending up. Per chart, pt 94 lb 12.8 oz on 1/17  Height: 4'11" Weight: 97 lb 8 oz UBW: 115 lb (per pt) BMI: 19.69    NUTRITION DIAGNOSIS: Unintentional weight loss related to ovarian cancer and associated treatments as evidenced by 18 lb (~16%) decrease from reported usual weight, s/p neoadjuvant chemotherapy and debulking surgery    INTERVENTION:  Continue strategies for increasing calories and protein with 3 meals + snacks Encouraged ONS or high calorie shakes with decreased appetite/intake following therapy. Suggested trying CIB mixed with whole milk as alternate supplement - sample packets provided + shake recipes  given  Tips for nausea with handout provided  Contact information provided    MONITORING, EVALUATION, GOAL: Patient will tolerate increased calories and protein to promote weight gain    Next Visit: To be scheduled with treatment as needed

## 2022-03-16 ENCOUNTER — Inpatient Hospital Stay: Payer: 59

## 2022-03-16 ENCOUNTER — Other Ambulatory Visit: Payer: Self-pay

## 2022-03-16 ENCOUNTER — Other Ambulatory Visit (HOSPITAL_COMMUNITY): Payer: Self-pay

## 2022-03-16 ENCOUNTER — Other Ambulatory Visit: Payer: Self-pay | Admitting: Hematology and Oncology

## 2022-03-16 VITALS — BP 106/67 | HR 54 | Temp 98.8°F | Resp 16 | Ht 59.0 in | Wt 104.8 lb

## 2022-03-16 DIAGNOSIS — Z5111 Encounter for antineoplastic chemotherapy: Secondary | ICD-10-CM | POA: Diagnosis not present

## 2022-03-16 DIAGNOSIS — C569 Malignant neoplasm of unspecified ovary: Secondary | ICD-10-CM

## 2022-03-16 MED ORDER — SODIUM CHLORIDE 0.9 % IV SOLN
10.0000 mg | Freq: Once | INTRAVENOUS | Status: AC
  Administered 2022-03-16: 10 mg via INTRAVENOUS
  Filled 2022-03-16: qty 10

## 2022-03-16 MED ORDER — MAGNESIUM SULFATE 2 GM/50ML IV SOLN
2.0000 g | Freq: Once | INTRAVENOUS | Status: AC
  Administered 2022-03-16: 2 g via INTRAVENOUS
  Filled 2022-03-16: qty 50

## 2022-03-16 MED ORDER — FUROSEMIDE 20 MG PO TABS
20.0000 mg | ORAL_TABLET | Freq: Every day | ORAL | 1 refills | Status: DC
  Filled 2022-03-16: qty 30, 30d supply, fill #0

## 2022-03-16 MED ORDER — POTASSIUM CHLORIDE IN NACL 20-0.9 MEQ/L-% IV SOLN
Freq: Once | INTRAVENOUS | Status: AC
  Filled 2022-03-16: qty 1000

## 2022-03-16 MED ORDER — SODIUM CHLORIDE 0.9 % IV SOLN
100.0000 mg/m2 | Freq: Once | INTRAVENOUS | Status: AC
  Administered 2022-03-16: 130 mg via INTRAVENOUS
  Filled 2022-03-16: qty 6.5

## 2022-03-16 MED ORDER — SODIUM CHLORIDE 0.9 % IV SOLN: Freq: Once | INTRAVENOUS | Status: AC

## 2022-03-16 MED ORDER — SODIUM CHLORIDE 0.9 % IV SOLN
15.0000 mg/m2 | Freq: Once | INTRAVENOUS | Status: AC
  Administered 2022-03-16: 20 mg via INTRAVENOUS
  Filled 2022-03-16: qty 20

## 2022-03-16 MED ORDER — PALONOSETRON HCL INJECTION 0.25 MG/5ML
0.2500 mg | Freq: Once | INTRAVENOUS | Status: AC
  Administered 2022-03-16: 0.25 mg via INTRAVENOUS
  Filled 2022-03-16: qty 5

## 2022-03-16 MED ORDER — SODIUM CHLORIDE 0.9 % IV SOLN
150.0000 mg | Freq: Once | INTRAVENOUS | Status: AC
  Administered 2022-03-16: 150 mg via INTRAVENOUS
  Filled 2022-03-16: qty 150

## 2022-03-16 MED ORDER — SODIUM CHLORIDE 0.9% FLUSH
10.0000 mL | INTRAVENOUS | Status: DC | PRN
  Administered 2022-03-16: 10 mL

## 2022-03-16 MED ORDER — FAMOTIDINE IN NACL 20-0.9 MG/50ML-% IV SOLN
20.0000 mg | Freq: Once | INTRAVENOUS | Status: AC
  Administered 2022-03-16: 20 mg via INTRAVENOUS
  Filled 2022-03-16: qty 50

## 2022-03-16 MED ORDER — HEPARIN SOD (PORK) LOCK FLUSH 100 UNIT/ML IV SOLN
500.0000 [IU] | Freq: Once | INTRAVENOUS | Status: AC | PRN
  Administered 2022-03-16: 500 [IU]

## 2022-03-16 MED FILL — Dexamethasone Sodium Phosphate Inj 100 MG/10ML: INTRAMUSCULAR | Qty: 1 | Status: AC

## 2022-03-17 ENCOUNTER — Other Ambulatory Visit: Payer: Self-pay | Admitting: Hematology and Oncology

## 2022-03-17 ENCOUNTER — Inpatient Hospital Stay: Payer: 59 | Attending: Gynecologic Oncology

## 2022-03-17 VITALS — BP 116/70 | HR 52 | Temp 98.0°F | Resp 16 | Ht 59.0 in | Wt 106.5 lb

## 2022-03-17 DIAGNOSIS — R000 Tachycardia, unspecified: Secondary | ICD-10-CM | POA: Insufficient documentation

## 2022-03-17 DIAGNOSIS — Z90722 Acquired absence of ovaries, bilateral: Secondary | ICD-10-CM | POA: Diagnosis not present

## 2022-03-17 DIAGNOSIS — C563 Malignant neoplasm of bilateral ovaries: Secondary | ICD-10-CM | POA: Diagnosis present

## 2022-03-17 DIAGNOSIS — R0602 Shortness of breath: Secondary | ICD-10-CM | POA: Insufficient documentation

## 2022-03-17 DIAGNOSIS — Z79899 Other long term (current) drug therapy: Secondary | ICD-10-CM | POA: Insufficient documentation

## 2022-03-17 DIAGNOSIS — Z90710 Acquired absence of both cervix and uterus: Secondary | ICD-10-CM | POA: Insufficient documentation

## 2022-03-17 DIAGNOSIS — Z5111 Encounter for antineoplastic chemotherapy: Secondary | ICD-10-CM | POA: Diagnosis present

## 2022-03-17 DIAGNOSIS — C7989 Secondary malignant neoplasm of other specified sites: Secondary | ICD-10-CM | POA: Diagnosis not present

## 2022-03-17 DIAGNOSIS — R197 Diarrhea, unspecified: Secondary | ICD-10-CM | POA: Diagnosis not present

## 2022-03-17 DIAGNOSIS — C7889 Secondary malignant neoplasm of other digestive organs: Secondary | ICD-10-CM | POA: Diagnosis not present

## 2022-03-17 DIAGNOSIS — E860 Dehydration: Secondary | ICD-10-CM | POA: Diagnosis not present

## 2022-03-17 DIAGNOSIS — Z5189 Encounter for other specified aftercare: Secondary | ICD-10-CM | POA: Insufficient documentation

## 2022-03-17 DIAGNOSIS — R601 Generalized edema: Secondary | ICD-10-CM | POA: Diagnosis not present

## 2022-03-17 DIAGNOSIS — D61818 Other pancytopenia: Secondary | ICD-10-CM | POA: Insufficient documentation

## 2022-03-17 DIAGNOSIS — Z932 Ileostomy status: Secondary | ICD-10-CM | POA: Diagnosis not present

## 2022-03-17 DIAGNOSIS — C569 Malignant neoplasm of unspecified ovary: Secondary | ICD-10-CM

## 2022-03-17 DIAGNOSIS — R001 Bradycardia, unspecified: Secondary | ICD-10-CM

## 2022-03-17 LAB — T4, FREE: Free T4: 0.64 ng/dL (ref 0.61–1.12)

## 2022-03-17 MED ORDER — FAMOTIDINE IN NACL 20-0.9 MG/50ML-% IV SOLN
20.0000 mg | Freq: Once | INTRAVENOUS | Status: AC
  Administered 2022-03-17: 20 mg via INTRAVENOUS
  Filled 2022-03-17: qty 50

## 2022-03-17 MED ORDER — HEPARIN SOD (PORK) LOCK FLUSH 100 UNIT/ML IV SOLN
500.0000 [IU] | Freq: Once | INTRAVENOUS | Status: AC | PRN
  Administered 2022-03-17: 500 [IU]

## 2022-03-17 MED ORDER — SODIUM CHLORIDE 0.9 % IV SOLN
10.0000 mg | Freq: Once | INTRAVENOUS | Status: AC
  Administered 2022-03-17: 10 mg via INTRAVENOUS
  Filled 2022-03-17: qty 10

## 2022-03-17 MED ORDER — FUROSEMIDE 20 MG PO TABS
20.0000 mg | ORAL_TABLET | Freq: Once | ORAL | Status: AC
  Administered 2022-03-17: 20 mg via ORAL

## 2022-03-17 MED ORDER — MAGNESIUM SULFATE 2 GM/50ML IV SOLN
2.0000 g | Freq: Once | INTRAVENOUS | Status: AC
  Administered 2022-03-17: 2 g via INTRAVENOUS
  Filled 2022-03-17: qty 50

## 2022-03-17 MED ORDER — SODIUM CHLORIDE 0.9 % IV SOLN: Freq: Once | INTRAVENOUS | Status: AC

## 2022-03-17 MED ORDER — SODIUM CHLORIDE 0.9% FLUSH
10.0000 mL | INTRAVENOUS | Status: DC | PRN
  Administered 2022-03-17: 10 mL

## 2022-03-17 MED ORDER — SODIUM CHLORIDE 0.9 % IV SOLN
100.0000 mg/m2 | Freq: Once | INTRAVENOUS | Status: AC
  Administered 2022-03-17: 130 mg via INTRAVENOUS
  Filled 2022-03-17: qty 6.5

## 2022-03-17 MED ORDER — SODIUM CHLORIDE 0.9 % IV SOLN
15.0000 mg/m2 | Freq: Once | INTRAVENOUS | Status: AC
  Administered 2022-03-17: 20 mg via INTRAVENOUS
  Filled 2022-03-17: qty 20

## 2022-03-17 MED ORDER — POTASSIUM CHLORIDE IN NACL 20-0.9 MEQ/L-% IV SOLN
Freq: Once | INTRAVENOUS | Status: AC
  Filled 2022-03-17: qty 1000

## 2022-03-17 MED FILL — Fosaprepitant Dimeglumine For IV Infusion 150 MG (Base Eq): INTRAVENOUS | Qty: 5 | Status: AC

## 2022-03-17 MED FILL — Dexamethasone Sodium Phosphate Inj 100 MG/10ML: INTRAMUSCULAR | Qty: 1 | Status: AC

## 2022-03-17 NOTE — Patient Instructions (Addendum)
[facility]  Discharge Instructions: Thank you for choosing [facility] to provide your oncology and hematology care.   If you have a lab appointment with the [facility], please go directly to the [facility] and check in at the registration area.   Wear comfortable clothing and clothing appropriate for easy access to any Portacath or PICC line.   We strive to give you quality time with your provider. You may need to reschedule your appointment if you arrive late (15 or more minutes).  Arriving late affects you and other patients whose appointments are after yours.  Also, if you miss three or more appointments without notifying the office, you may be dismissed from the clinic at the provider's discretion.      For prescription refill requests, have your pharmacy contact our office and allow 72 hours for refills to be completed.    Today you received the following chemotherapy and/or immunotherapy agents: Cisplatin and Etoposide   To help prevent nausea and vomiting after your treatment, we encourage you to take your nausea medication as directed.  BELOW ARE SYMPTOMS THAT SHOULD BE REPORTED IMMEDIATELY: *FEVER GREATER THAN 100.4 F (38 C) OR HIGHER *CHILLS OR SWEATING *NAUSEA AND VOMITING THAT IS NOT CONTROLLED WITH YOUR NAUSEA MEDICATION *UNUSUAL SHORTNESS OF BREATH *UNUSUAL BRUISING OR BLEEDING *URINARY PROBLEMS (pain or burning when urinating, or frequent urination) *BOWEL PROBLEMS (unusual diarrhea, constipation, pain near the anus) TENDERNESS IN MOUTH AND THROAT WITH OR WITHOUT PRESENCE OF ULCERS (sore throat, sores in mouth, or a toothache) UNUSUAL RASH, SWELLING OR PAIN  UNUSUAL VAGINAL DISCHARGE OR ITCHING   Items with * indicate a potential emergency and should be followed up as soon as possible or go to the Emergency Department if any problems should occur.  Please show the CHEMOTHERAPY ALERT CARD or IMMUNOTHERAPY ALERT  CARD at check-in to the Emergency Department and triage nurse.  Should you have questions after your visit or need to cancel or reschedule your appointment, please contact [facility]  Dept: [phone]  and follow the prompts.  Office hours are 8:00 a.m. to 4:30 p.m. Monday - Friday. Please note that voicemails left after 4:00 p.m. may not be returned until the following business day.  We are closed weekends and major holidays. You have access to a nurse at all times for urgent questions. Please call the main number to the clinic Dept: [phone] and follow the prompts.   For any non-urgent questions, you may also contact your provider using MyChart. We now offer e-Visits for anyone 51 and older to request care online for non-urgent symptoms. For details visit [email or web address].   Also download the MyChart app! Go to the app store, search "MyChart", open the app, select [facility], and log in with your MyChart username and password.  Cisplatin Injection What is this medication? CISPLATIN (SIS pla tin) treats some types of cancer. It works by slowing down the growth of cancer cells. This medicine may be used for other purposes; ask your health care provider or pharmacist if you have questions. COMMON BRAND NAME(S): Platinol, Platinol -AQ What should I tell my care team before I take this medication? They need to know if you have any of these conditions: Eye disease, vision problems Hearing problems Kidney disease Low blood counts, such as low white cells, platelets, or red blood cells Tingling of the fingers or toes, or other nerve disorder An unusual or  allergic reaction to cisplatin, carboplatin, oxaliplatin, other medications, foods, dyes, or preservatives If you or your partner are pregnant or trying to get pregnant Breast-feeding How should I use this medication? This medication is injected into a vein. It is given by your care team in a  hospital or clinic setting. Talk to your care team about the use of this medication in children. Special care may be needed. Overdosage: If you think you have taken too much of this medicine contact a poison control center or emergency room at once. NOTE: This medicine is only for you. Do not share this medicine with others. What if I miss a dose? Keep appointments for follow-up doses. It is important not to miss your dose. Call your care team if you are unable to keep an appointment. What may interact with this medication? Do not take this medication with any of the following: Live virus vaccines This medication may also interact with the following: Certain antibiotics, such as amikacin, gentamicin, neomycin, polymyxin B, streptomycin, tobramycin, vancomycin Foscarnet This list may not describe all possible interactions. Give your health care provider a list of all the medicines, herbs, non-prescription drugs, or dietary supplements you use. Also tell them if you smoke, drink alcohol, or use illegal drugs. Some items may interact with your medicine. What should I watch for while using this medication? Your condition will be monitored carefully while you are receiving this medication. You may need blood work done while taking this medication. This medication may make you feel generally unwell. This is not uncommon, as chemotherapy can affect healthy cells as well as cancer cells. Report any side effects. Continue your course of treatment even though you feel ill unless your care team tells you to stop. This medication may increase your risk of getting an infection. Call your care team for advice if you get a fever, chills, sore throat, or other symptoms of a cold or flu. Do not treat yourself. Try to avoid being around people who are sick. Avoid taking medications that contain aspirin, acetaminophen, ibuprofen, naproxen, or ketoprofen unless instructed by your care team. These medications may hide a  fever. This medication may increase your risk to bruise or bleed. Call your care team if you notice any unusual bleeding. Be careful brushing or flossing your teeth or using a toothpick because you may get an infection or bleed more easily. If you have any dental work done, tell your dentist you are receiving this medication. Drink fluids as directed while you are taking this medication. This will help protect your kidneys. Call your care team if you get diarrhea. Do not treat yourself. Talk to your care team if you or your partner wish to become pregnant or think you might be pregnant. This medication can cause serious birth defects if taken during pregnancy and for 14 months after the last dose. A negative pregnancy test is required before starting this medication. A reliable form of contraception is recommended while taking this medication and for 14 months after the last dose. Talk to your care team about effective forms of contraception. Do not father a child while taking this medication and for 11 months after the last dose. Use a condom during sex during this time period. Do not breast-feed while taking this medication. This medication may cause infertility. Talk to your care team if you are concerned about your fertility. What side effects may I notice from receiving this medication? Side effects that you should report to your care team  as soon as possible: Allergic reactions--skin rash, itching, hives, swelling of the face, lips, tongue, or throat Eye pain, change in vision, vision loss Hearing loss, ringing in ears Infection--fever, chills, cough, sore throat, wounds that don't heal, pain or trouble when passing urine, general feeling of discomfort or being unwell Kidney injury--decrease in the amount of urine, swelling of the ankles, hands, or feet Low red blood cell level--unusual weakness or fatigue, dizziness, headache, trouble breathing Painful swelling, warmth, or redness of the skin,  blisters or sores at the infusion site Pain, tingling, or numbness in the hands or feet Unusual bruising or bleeding Side effects that usually do not require medical attention (report to your care team if they continue or are bothersome): Hair loss Nausea Vomiting This list may not describe all possible side effects. Call your doctor for medical advice about side effects. You may report side effects to FDA at 1-800-FDA-1088. Where should I keep my medication? This medication is given in a hospital or clinic. It will not be stored at home. NOTE: This sheet is a summary. It may not cover all possible information. If you have questions about this medicine, talk to your doctor, pharmacist, or health care provider.  2023 Elsevier/Gold Standard (2021-05-28 00:00:00)  Etoposide Injection What is this medication? ETOPOSIDE (e toe POE side) treats some types of cancer. It works by slowing down the growth of cancer cells. This medicine may be used for other purposes; ask your health care provider or pharmacist if you have questions. COMMON BRAND NAME(S): Etopophos, Toposar, VePesid What should I tell my care team before I take this medication? They need to know if you have any of these conditions: Infection Kidney disease Liver disease Low blood counts, such as low white cell, platelet, red cell counts An unusual or allergic reaction to etoposide, other medications, foods, dyes, or preservatives If you or your partner are pregnant or trying to get pregnant Breastfeeding How should I use this medication? This medication is injected into a vein. It is given by your care team in a hospital or clinic setting. Talk to your care team about the use of this medication in children. Special care may be needed. Overdosage: If you think you have taken too much of this medicine contact a poison control center or emergency room at once. NOTE: This medicine is only for you. Do not share this medicine with  others. What if I miss a dose? Keep appointments for follow-up doses. It is important not to miss your dose. Call your care team if you are unable to keep an appointment. What may interact with this medication? Warfarin This list may not describe all possible interactions. Give your health care provider a list of all the medicines, herbs, non-prescription drugs, or dietary supplements you use. Also tell them if you smoke, drink alcohol, or use illegal drugs. Some items may interact with your medicine. What should I watch for while using this medication? Your condition will be monitored carefully while you are receiving this medication. This medication may make you feel generally unwell. This is not uncommon as chemotherapy can affect healthy cells as well as cancer cells. Report any side effects. Continue your course of treatment even though you feel ill unless your care team tells you to stop. This medication can cause serious side effects. To reduce the risk, your care team may give you other medications to take before receiving this one. Be sure to follow the directions from your care team. This medication  may increase your risk of getting an infection. Call your care team for advice if you get a fever, chills, sore throat, or other symptoms of a cold or flu. Do not treat yourself. Try to avoid being around people who are sick. This medication may increase your risk to bruise or bleed. Call your care team if you notice any unusual bleeding. Talk to your care team about your risk of cancer. You may be more at risk for certain types of cancers if you take this medication. Talk to your care team if you may be pregnant. Serious birth defects can occur if you take this medication during pregnancy and for 6 months after the last dose. You will need a negative pregnancy test before starting this medication. Contraception is recommended while taking this medication and for 6 months after the last dose. Your  care team can help you find the option that works for you. If your partner can get pregnant, use a condom during sex while taking this medication and for 4 months after the last dose. Do not breastfeed while taking this medication. This medication may cause infertility. Talk to your care team if you are concerned about your fertility. What side effects may I notice from receiving this medication? Side effects that you should report to your care team as soon as possible: Allergic reactions--skin rash, itching, hives, swelling of the face, lips, tongue, or throat Infection--fever, chills, cough, sore throat, wounds that don't heal, pain or trouble when passing urine, general feeling of discomfort or being unwell Low red blood cell level--unusual weakness or fatigue, dizziness, headache, trouble breathing Unusual bruising or bleeding Side effects that usually do not require medical attention (report to your care team if they continue or are bothersome): Diarrhea Fatigue Hair loss Loss of appetite Nausea Vomiting This list may not describe all possible side effects. Call your doctor for medical advice about side effects. You may report side effects to FDA at 1-800-FDA-1088. Where should I keep my medication? This medication is given in a hospital or clinic. It will not be stored at home. NOTE: This sheet is a summary. It may not cover all possible information. If you have questions about this medicine, talk to your doctor, pharmacist, or health care provider.  2023 Elsevier/Gold Standard (2007-03-24 00:00:00)

## 2022-03-17 NOTE — Progress Notes (Signed)
Pt. heart rate 44-47. Pt denies chest pain, dizziness, but has slight shortness of breath with exertion. Per Dr. Alvy Bimler ok to proceed with treatment today and will give Lasix after pre hydration completed.

## 2022-03-18 ENCOUNTER — Encounter: Payer: Self-pay | Admitting: Gynecologic Oncology

## 2022-03-18 ENCOUNTER — Inpatient Hospital Stay (HOSPITAL_BASED_OUTPATIENT_CLINIC_OR_DEPARTMENT_OTHER): Payer: 59 | Admitting: Gynecologic Oncology

## 2022-03-18 ENCOUNTER — Other Ambulatory Visit: Payer: Self-pay

## 2022-03-18 ENCOUNTER — Inpatient Hospital Stay: Payer: 59

## 2022-03-18 VITALS — BP 124/75 | HR 62 | Temp 98.6°F | Resp 16 | Wt 103.8 lb

## 2022-03-18 VITALS — BP 130/71 | HR 58 | Temp 98.5°F | Resp 14 | Wt 105.0 lb

## 2022-03-18 DIAGNOSIS — Z932 Ileostomy status: Secondary | ICD-10-CM

## 2022-03-18 DIAGNOSIS — Z90722 Acquired absence of ovaries, bilateral: Secondary | ICD-10-CM

## 2022-03-18 DIAGNOSIS — C569 Malignant neoplasm of unspecified ovary: Secondary | ICD-10-CM

## 2022-03-18 DIAGNOSIS — Z90710 Acquired absence of both cervix and uterus: Secondary | ICD-10-CM

## 2022-03-18 DIAGNOSIS — Z7189 Other specified counseling: Secondary | ICD-10-CM

## 2022-03-18 DIAGNOSIS — Z5111 Encounter for antineoplastic chemotherapy: Secondary | ICD-10-CM | POA: Diagnosis not present

## 2022-03-18 LAB — TSH: TSH: 0.324 u[IU]/mL — ABNORMAL LOW (ref 0.350–4.500)

## 2022-03-18 MED ORDER — SODIUM CHLORIDE 0.9 % IV SOLN
100.0000 mg/m2 | Freq: Once | INTRAVENOUS | Status: AC
  Administered 2022-03-18: 130 mg via INTRAVENOUS
  Filled 2022-03-18: qty 6.5

## 2022-03-18 MED ORDER — ACETAMINOPHEN 325 MG PO TABS
650.0000 mg | ORAL_TABLET | Freq: Once | ORAL | Status: AC
  Administered 2022-03-18: 650 mg via ORAL
  Filled 2022-03-18: qty 2

## 2022-03-18 MED ORDER — SODIUM CHLORIDE 0.9 % IV SOLN
15.0000 mg/m2 | Freq: Once | INTRAVENOUS | Status: AC
  Administered 2022-03-18: 20 mg via INTRAVENOUS
  Filled 2022-03-18: qty 20

## 2022-03-18 MED ORDER — MAGNESIUM SULFATE 2 GM/50ML IV SOLN
2.0000 g | Freq: Once | INTRAVENOUS | Status: AC
  Administered 2022-03-18: 2 g via INTRAVENOUS
  Filled 2022-03-18: qty 50

## 2022-03-18 MED ORDER — SODIUM CHLORIDE 0.9 % IV SOLN
150.0000 mg | Freq: Once | INTRAVENOUS | Status: AC
  Administered 2022-03-18: 150 mg via INTRAVENOUS
  Filled 2022-03-18: qty 150

## 2022-03-18 MED ORDER — SODIUM CHLORIDE 0.9 % IV SOLN
10.0000 mg | Freq: Once | INTRAVENOUS | Status: AC
  Administered 2022-03-18: 10 mg via INTRAVENOUS
  Filled 2022-03-18: qty 10

## 2022-03-18 MED ORDER — HEPARIN SOD (PORK) LOCK FLUSH 100 UNIT/ML IV SOLN
500.0000 [IU] | Freq: Once | INTRAVENOUS | Status: AC | PRN
  Administered 2022-03-18: 500 [IU]

## 2022-03-18 MED ORDER — POTASSIUM CHLORIDE IN NACL 20-0.9 MEQ/L-% IV SOLN
Freq: Once | INTRAVENOUS | Status: AC
  Filled 2022-03-18: qty 1000

## 2022-03-18 MED ORDER — FAMOTIDINE IN NACL 20-0.9 MG/50ML-% IV SOLN
20.0000 mg | Freq: Once | INTRAVENOUS | Status: AC
  Administered 2022-03-18: 20 mg via INTRAVENOUS
  Filled 2022-03-18: qty 50

## 2022-03-18 MED ORDER — PALONOSETRON HCL INJECTION 0.25 MG/5ML
0.2500 mg | Freq: Once | INTRAVENOUS | Status: AC
  Administered 2022-03-18: 0.25 mg via INTRAVENOUS
  Filled 2022-03-18: qty 5

## 2022-03-18 MED ORDER — SODIUM CHLORIDE 0.9% FLUSH
10.0000 mL | INTRAVENOUS | Status: DC | PRN
  Administered 2022-03-18: 10 mL

## 2022-03-18 MED ORDER — SODIUM CHLORIDE 0.9 % IV SOLN: Freq: Once | INTRAVENOUS | Status: AC

## 2022-03-18 NOTE — Patient Instructions (Addendum)
It was good to see you today.  You are healing very well from surgery.  Please remember, no heavy lifting for at least 6 weeks and nothing in the vagina for at least 10.  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  This typically happens about 8 weeks after your surgery.  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  We will tentatively schedule your surgery for early May.

## 2022-03-18 NOTE — Patient Instructions (Signed)
[facility]  Discharge Instructions: Thank you for choosing [facility] to provide your oncology and hematology care.   If you have a lab appointment with the Cancer Center, please go directly to the Cancer Center and check in at the registration area.   Wear comfortable clothing and clothing appropriate for easy access to any Portacath or PICC line.   We strive to give you quality time with your provider. You may need to reschedule your appointment if you arrive late (15 or more minutes).  Arriving late affects you and other patients whose appointments are after yours.  Also, if you miss three or more appointments without notifying the office, you may be dismissed from the clinic at the provider's discretion.      For prescription refill requests, have your pharmacy contact our office and allow 72 hours for refills to be completed.    Today you received the following chemotherapy and/or immunotherapy agents Cisplatin      To help prevent nausea and vomiting after your treatment, we encourage you to take your nausea medication as directed.  BELOW ARE SYMPTOMS THAT SHOULD BE REPORTED IMMEDIATELY: *FEVER GREATER THAN 100.4 F (38 C) OR HIGHER *CHILLS OR SWEATING *NAUSEA AND VOMITING THAT IS NOT CONTROLLED WITH YOUR NAUSEA MEDICATION *UNUSUAL SHORTNESS OF BREATH *UNUSUAL BRUISING OR BLEEDING *URINARY PROBLEMS (pain or burning when urinating, or frequent urination) *BOWEL PROBLEMS (unusual diarrhea, constipation, pain near the anus) TENDERNESS IN MOUTH AND THROAT WITH OR WITHOUT PRESENCE OF ULCERS (sore throat, sores in mouth, or a toothache) UNUSUAL RASH, SWELLING OR PAIN  UNUSUAL VAGINAL DISCHARGE OR ITCHING   Items with * indicate a potential emergency and should be followed up as soon as possible or go to the Emergency Department if any problems should occur.  Please show the CHEMOTHERAPY ALERT CARD or IMMUNOTHERAPY ALERT CARD at  check-in to the Emergency Department and triage nurse.  Should you have questions after your visit or need to cancel or reschedule your appointment, please contact [facility]  Dept: [phone]  and follow the prompts.  Office hours are 8:00 a.m. to 4:30 p.m. Monday - Friday. Please note that voicemails left after 4:00 p.m. may not be returned until the following business day.  We are closed weekends and major holidays. You have access to a nurse at all times for urgent questions. Please call the main number to the clinic Dept: [phone] and follow the prompts.   For any non-urgent questions, you may also contact your provider using MyChart. We now offer e-Visits for anyone 18 and older to request care online for non-urgent symptoms. For details visit [email or web address].   Also download the MyChart app! Go to the app store, search "MyChart", open the app, select [facility], and log in with your MyChart username and password.   

## 2022-03-18 NOTE — Progress Notes (Signed)
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 21.1 by 10.9 by 18.7 cm abdominopelvic mass, with a satellite mass in the right upper quadrant anteriorly, and moderate ascites with tumor deposits along the pelvic ascites posteriorly. The mass could be arising from right or left ovary, or both. The mass displaces the bowel and IVC, but there is no IVC thrombus or pelvic DVT identified. 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 2. Additional satellite mass above the liver is partially included, along with tumor deposits along the ascites. 3. Low-level edema along the lateral abdominal wall musculature. 4. Flattening of the IVC at the level of the aortic bifurcation due to the large abdominopelvic tumor. No findings of pelvic DVT.     12/07/2021 Imaging   CT abdomen and pelvis 1. Large central abdominal and pelvic mass consistent with known malignancy. This is incompletely visualized by this CT of the abdomen, but was seen on recent pelvic MRI. 2. 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 Right omental implant consistent with metastatic disease. Small amount of abdominal ascites. 3. No evidence for bowel or ureteral obstruction. 4. Aortic Atherosclerosis (ICD10-I70.0). 5. Chest findings dictated separately.   12/07/2021 Imaging   CT chest 1. Negative for pulmonary embolism. And no acute or metastatic process identified in the Chest. 2. Partially visible liver mass and ascites, staging CT Abdomen today is reported separately.     12/07/2021 Procedure   Successful placement  of a right internal jugular approach power injectable Port-A-Cath. The catheter is ready for immediate use.   12/07/2021 Procedure   Ultrasound-guided biopsy of right upper quadrant mass.    12/08/2021 Initial Diagnosis   Ovarian cancer ([place])   12/08/2021 Cancer Staging   Staging form: Ovary, Fallopian Tube, and Primary Peritoneal Carcinoma, AJCC 8th Edition - Clinical stage from 12/08/2021: FIGO Stage IIIC (cT3c, cN0, cM0) - Signed by Heath Lark, MD on 12/10/2021 Stage prefix: Initial diagnosis   12/20/2021 - 12/28/2021 Chemotherapy   Patient is on Treatment Plan : Ovarian germ cell tumor q21d x 3 Cycles     01/03/2022 Imaging   IMPRESSION: 1. Large central abdominopelvic mass compatible with malignancy, 3450 cubic cm in volume. This may be arising from the ovaries with slight eccentricity to the right in the pelvis. 2. Mild increase in size of the right omental tumor deposit. 3. Mild increase in size of the subcapsular mass along the dome of the right hepatic lobe, with suspected hepatic invasion. 4. Scattered malignant ascites. 5. No dilated bowel to suggest obstruction. 6. 2 mm left kidney lower pole nonobstructive renal calculus. 7. Mild abdominal aortic atherosclerotic vascular disease.   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  Postoperative Diagnosis: Status post R0 cytoreductive surgery and heating intraperitoneal chemotherapy  Procedures: LAPAROSCOPY, ABDOMEN, PERITONEUM, & OMENTUM, DIAGNOSTIC, W/WO COLLECTION SPECIMEN(S) BY BRUSHING OR WASHING  EXCISION/DESTRUCTION, OPEN, INTRA-ABD TUMOR/CYST/ENDOMETRIOMA, 1+ PERITONEAL/MESENTERI/RETROPERIT 5CM OR <COLECTOMY, PARTIAL; WITH ANASTOMOSIS COLOSTOMY OR SKIN LEVEL CECOSTOMY SPLENECTOMY; TOT (SEPART PROC) EXPLORATORY LAPAROTOMY, EXPLORATORY CELIOTOMY WITH OR WITHOUT BIOPSY(S) HYPERTHERMIA, EXTERNALLY GENERATED; DEEP  CHEMOTHERAPY  ADMINISTRATION INTO THE PERITONEAL CAVITY VIA INDWELLING PORT OR CATHETER RESECTION (INITIAL) OVARIAN, TUBAL/PRIM PERITONEAL MALIG W/BIL S&O/OMENTECT; W/RAD DISSECTION FOR DEBULKING CHOLECYSTECTOMY APPENDECTOMY OMENTECTOMY, EPIPLOECTOMY, RESECTION OF OMENTUM Procedures: Exploratory laparotomy, total abdominal hysterectomy with radical dissection, bilateral ureterolysis, bilateral salpingo-oophorectomy, posterior cul-de-sac peritonectomy, tumor debulking by Dr Berline Lopes with Gynecology Oncology Dr [place] Scrape with surgical oncology: Diagnostic laparoscopy, exploratory laparotomy, low anterior resection with primary end-to-end anastomosis, enterolysis, cholecystectomy, tumor capsule tumor debulking, splenectomy, appendectomy, omentectomy, diverting loop ileostomy formation  Surgeon: Jeral Pinch, MD  Findings: On diagnostic laparoscopy, only able to clearly visualize the left upper quadrant. Adhesions of bowel in the right upper quadrant limiting survey, minimal ascites noted in the pelvis, scattered tumor plaques visualized on left diaphragm. Given these findings, the decision was made to convert to exploratory laparotomy. Multiple loops of small bowel encased in a Cancn like structure in the midline with multiple filmy adhesions tethering to the superior aspect of the a large pelvic mass. Multicystic and necrotic pelvic mass originating from left ovary, approximately 18 x 20 cm incorporated within the left broad ligament with the left tube draped overlying the left ovarian mass. Bilateral tubes normal in appearance, normal-appearing uterus with smooth serosa. Right adnexa similarly with approximate 10 x 8 multicystic and necrotic mass. Diffuse tumor plaques encompassing posterior cul-de-sac peritoneum. Rectosigmoid densely adhered to posterior aspect of left adnexal mass requiring dissection and ultimately resection. Scattered treated subcentimeter nodules along small bowel mesentery. 5 x 5 cm necrotic and  multicystic tumor at the level of the hepatic flexure. Enlarged similar multicystic necrotic tumor adherent between the right diaphragm and the liver without any evidence of liver invasion and tumor encased solely within the liver capsule. Multiple tumor nodules along the spleen requiring splenectomy. Omentum overall fairly normal in appearance with only scattered small tumor deposits, no omental cake was encountered. Please see surgical oncology operative note for further details regarding operative findings for their portion of the procedure.  Specimens: ID Type Source Tests Collected by Time Destination 1 : Abdominal fat pad Tissue Abdomen SURGICAL PATHOLOGY EXAM Marthann Schiller, MD 02/15/2022 [id number] 2 : Left adnexa Tissue Abdomen SURGICAL PATHOLOGY EXAM Marthann Schiller, MD 02/15/2022 [phone] 3 : right adnexa Tissue Abdomen SURGICAL PATHOLOGY EXAM Marthann Schiller, MD 02/15/2022 [phone] 4 : Left perimetrium Tissue Uterus SURGICAL PATHOLOGY EXAM Lafonda Mosses, MD 02/15/2022 [phone] 5 : Uterus and cervix Tissue Uterus SURGICAL PATHOLOGY EXAM Marthann Schiller, MD 02/15/2022 1018 6 : Low Anterior Resection Tissue Abdomen SURGICAL PATHOLOGY EXAM Marthann Schiller, MD 02/15/2022 1055 7 : Right peritoneum Tissue Abdomen SURGICAL PATHOLOGY EXAM Marthann Schiller, MD 02/15/2022 1058 8 : Right retroperitoneal mass Tissue Abdomen SURGICAL PATHOLOGY EXAM Marthann Schiller, MD 02/15/2022 1101 9 : Omentum Tissue Omentum SURGICAL PATHOLOGY EXAM Marthann Schiller, MD 02/15/2022 1130 10 : Gallbladder Tissue Gallbladder SURGICAL PATHOLOGY EXAM Marthann Schiller, MD 02/15/2022 1143 11 : Right diaphragm Tissue Diaphragm SURGICAL PATHOLOGY EXAM Marthann Schiller, MD 02/15/2022 1143 12 : Left diaphragm nodule Tissue Diaphragm SURGICAL PATHOLOGY EXAM Marthann Schiller, MD 02/15/2022 1157 13 : Spleen Tissue Spleen SURGICAL PATHOLOGY EXAM Marthann Schiller, MD  02/15/2022 1204 14 : Appendix Tissue Appendix SURGICAL PATHOLOGY EXAM Marthann Schiller, MD 02/15/2022 1213 15 : Small bowel mesenteric nodules Tissue Small Bowel SURGICAL PATHOLOGY EXAM Marthann Schiller, MD 02/15/2022 1215 16 : Colon mesenteric nodule Tissue Colon SURGICAL PATHOLOGY EXAM Marthann Schiller, MD 02/15/2022 1221 17 : Hepatic flexure mass Tissue Liver SURGICAL PATHOLOGY  Iantha Fallen, MD 02/19/1094 0454  Complications: None  Indications for Procedure: Navie Lamoreaux is a 52 y.o. 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 Prior to the procedure, all risks, benefits, and alternatives were discussed and informed surgical consent was signed.  Procedure: Patient was taken to the operating room where general anesthesia was achieved. She was positioned in dorsal lithotomy and prepped and draped per the surgical oncology team. A foley catheter was inserted into the bladder. A 5 mm skin incision was made in the left upper quadrant at Palmer's point, the abdomen was then entered with direct Optiview entry and the abdomen was insufflated. Findings were noted as above.  The laparoscope was then removed from the abdomen, the pneumoperitoneum was maintained. A vertical midline incision was made with the scalpel and the abdomen was entered sharply. The abdomen and pelvis were surveyed with findings as documented above. A wound protector was placed followed by the [facility] retractor.  The enlarged left adnexal mass was then carefully removed of its bilateral sidewall attachments using a mixture of blunt and cautery dissection. Multiple small bowel loops were adhered to the superior and posterior aspect of the mass with filmy adhesions which were carefully bluntly resected. The rectosigmoid was noted to be densely adhered to the posterior aspect of the pelvic mass. Dissection was attempted  using mixture of blunt dissection as well as sharp dissection with the Metzenbaum scissors, and unavoidable enterotomy of approximately 4 cm was made to the anterior surface of the rectosigmoid. This enterotomy was then closed with 4-0 PDS in a running fashion to minimize contamination. Further dissection removing the remainder of the rectosigmoid from the posterior aspect of the pelvic mass was then undertaken. The pelvic mass was then able to be fully elevated from the pelvis. The left infundibulopelvic ligament was able to be isolated with the left ureter clearly visualized below. The left ureter was further dissected away from the medial leaf of the broad ligament, and a vessel loop was placed. The left infundibulopelvic ligament was then isolated, clamped, and transected using the LigaSure device as well as and suture-ligated. The left utero-ovarian ligament was then isolated, clamped, cauterized and transected. The left pelvic mass was then removed entirely.  Attention was then turned to the right. An additional enlarged multicystic right adnexal mass was noted, this was carefully elevated from the pelvis using blunt dissection. The right round ligament was then grasped, elevated, and transected. The broad ligament was then opened posteriorly. The ureter was identified and the right infundibulopelvic ligament was then isolated, clamped, transected, and tied. The right utero ovarian ligament was then isolated, clamped, and transected using the LigaSure device and the right adnexal mass was removed.  Attention then was turned toward the hysterectomy. A sponge stick was placed within the vagina. The right broad ligament dissection was opened anteriorly and the bladder flap was developed. The left round ligament was then further transected, and the broad ligament opened posteriorly and anteriorly to complete the bladder flap. The right uterine artery was skeletonized, clamped, transected, and suture-ligated.  There was significant parametrial tumor involvement on the left side, and therefore a radical dissection was required to isolate the blood supply. The left ureter was further dissected down to the level near the trigone. The left uterine artery was traced to its origin, isolated, and clamped with vascular clips. Similarly the left uterine vein due to significant  necrotic tumor involvement was friable. Detachment was similarly carefully isolated, and clipped with vascular clips. Sequential clamps were then used to transect the remainder of the broad and cardinal ligaments bilaterally, with each pedicle being suture-ligated. A large right angle clamp was then able to be placed below the cervix, and the uterus and cervix were amputated from the vagina. The vaginal cuff was then closed with several figure-of-eight sutures using 0 Vicryl.    02/15/2022 Pathology Results   A: Abdominal fat pad, excision - Fibroadipose tissue with residual mixed malignant germ cell tumor with treatment effect   B: Left adnexa, salpingo-oophorectomy - Residual mixed malignant germ cell tumor with treatment effect, [id number] (See Comment and Synoptic report) - FIGO stage IIIC - Fallopian tubes with no tumor present   C: Right adnexa, salpingo-oophorectomy - Residual mixed malignant germ cell tumor with treatment effect (See Synoptic report) - Fallopian tubes not identified   D: Left parametrium, biopsy - Fibroadipose tissue with residual mixed malignant germ cell tumor with treatment effect   E: Uterus and cervix, hysterectomy - Right adnexal soft tissue with residual mixed malignant germ cell tumor with treatment effect   Myometrium: - Adenomyosis - Leiomyomata measuring up to 0.7 cm - Extensive chronic serositis   Cervix:  - Ectocervix and endocervix with chronic cervicitis    Endometrium: - Inactive endometrium   F: Low anterior resection - Residual mixed malignant  germ cell tumor with treatment effect  involving colonic serosa and pericolonic adipose tissue - Proximal and distal colonic resection margins with chronic serositis, but negative for tumor    G: Right peritoneum, biopsy - Fibroadipose tissue with residual mixed malignant germ cell tumor with treatment effect   H: Right retroperitoneal mass, excision - Fibroadipose tissue with residual mixed malignant germ cell tumor with treatment effect   I: Omentum, omentectomy - Fibroadipose tissue with residual mixed malignant germ cell tumor with treatment effect   J: Gallbladder, cholecystectomy - Gallbladder with mild chronic serositis and fibrous adhesion   K: Right diaphragm, excision - Fibrous tissue with residual mixed malignant germ cell tumor with treatment effect - Hepatic tissue with no tumor present   L: Left diaphragm nodule, biopsy - Fibrous tissue with residual mixed malignant germ cell tumor with treatment effect   M: Spleen, splenectomy - Residual mixed malignant germ cell tumor with treatment effect involving splenic capsule   N: Appendix, appendectomy - Appendix with fibrous obliteration at the tip - Periappendiceal adipose tissue with acute and chronic inflammation   O: Small bowel mesenteric nodules, excision - Fibroadipose tissue with residual mixed malignant germ cell tumor with treatment effect   P: Mesenteric nodules, excision - Fibroadipose tissue with residual mixed malignant germ cell tumor with treatment effect   Q: Hepatic flexure mass, excision - Fibroadipose tissue with residual mixed malignant germ cell tumor with treatment effect   R: Anvil and donut, biopsy - Benign segment of colon with acute and chronic inflammation and fibrous adhesion  SPECIMEN    Procedure:    Radical hysterectomy     Procedure:    Omentectomy     Procedure:    Peritoneal tumor debulking     Hysterectomy Type:    Abdominal     Specimen Integrity:          Right Ovary Integrity:    Capsule intact     Specimen  Integrity:          Left Ovary Integrity:    Capsule intact   TUMOR  Tumor Site:    Bilateral ovaries     Tumor Size:    Greatest Dimension (Centimeters): 19.5 cm     Histologic Type:    Mixed malignant germ cell tumor:  yolk sac tumor, mature teratoma, mature neural elements, immature neural tissue, and immature cartilage     Ovarian Surface Involvement:    Present, right and left     Other Tissue / Organ Involvement:    Right ovary     Other Tissue / Organ Involvement:    Left ovary     Other Tissue / Organ Involvement:    Right fallopian tube     Other Tissue / Organ Involvement:    Omentum     Other Tissue / Organ Involvement:    Diaphragm and splenic capsule     Largest Extrapelvic Peritoneal Focus:    Macroscopic (greater than 2 cm): Splenic capsule     Peritoneal / Ascitic Fluid Involvement:    Not submitted / unknown   REGIONAL LYMPH NODES     Regional Lymph Node Status:    Not applicable (no regional lymph nodes submitted or found)   DISTANT METASTASIS     Distant Site(s) Involved:    Diaphragm, splenic capsule   pTNM CLASSIFICATION (AJCC 8th Edition)     Reporting of pT, pN, and (when applicable) pM categories is based on information available to the pathologist at the time the report is issued. As per the AJCC (Chapter 1, 8th Ed.) it is the managing physician's responsibility to establish the final pathologic stage based upon all pertinent information, including but potentially not limited to this pathology report.     Modified Classification:    y     pT Category:    pT3c     pN Category:    pN not assigned (no nodes submitted or found)     pM Category:    pM1   FIGO STAGE     FIGO Stage:    IIIC   COMMENT:  The needle core biopsies show a heterogeneous combination of elements including abundant neural tissue with atypical and immature features. There are also atypical glandular epithelial structures and focal yolk sac elements with microcystic pattern.  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  Immunohistochemistry is performed for cytokeratin AE1/AE3, CD117, CD30, G FAP, glypican-3, OCT3/4, synaptophysin, S100 and CD30.     03/14/2022 -  Chemotherapy   Patient is on Treatment Plan : TESTICULAR EP D1-5 q21d       Interval History: Doing well.  Adjuvant chemotherapy this week with etoposide cisplatin.  Struggling with change in weight related to fluid retention.  Has some shortness of breath starting this week with increased activity, secondary to fluid retention.  Denies any abdominal pain.  Is using Lyrica only at night to help her sleep.  Has some pulling sensation in her upper abdomen and above her ostomy.  Scheduled to see wound care again next week due to some skin breakdown around her ostomy.  Denies any leaking recently from her stoma.  Is having approximately 600 cc of output each day.  Denies any urinary symptoms.  Denies any vaginal bleeding or discharge.  Past Medical/Surgical History: Past Medical History:  Diagnosis Date   Arthritis    Endometriosis    Family history of breast cancer in mother    at age 43   GAD (generalized anxiety disorder)  lexapro helped 2017 (Dr. Hoy Finlay did have extra stress of the Master's degree program on her at that time.  Got counseling.       GERD (gastroesophageal reflux disease)    Gestational diabetes    Kidney stones    Low back pain    Dr. Charlann Boxer.   Neck pain    and left shoulder pain: managed by osteopathic manipulation by Dr. Charlann Boxer. ?cervical radiculopathy   PAC (premature atrial contraction)     Past Surgical History:  Procedure Laterality Date   ABDOMINAL EXPLORATION SURGERY  2009   fulguration of endometriosis - [facility] surgery   IR IMAGING GUIDED PORT INSERTION  12/08/2021   IR US GUIDE BX ASP/DRAIN  12/08/2021   SALPINGECTOMY Right 2009   TONSILLECTOMY     TUBAL LIGATION Left 2009    Family  History  Problem Relation Age of Onset   Rheum arthritis Mother    Stroke Mother    Hypertension Mother    Heart disease Mother    Arthritis Mother    Breast cancer Mother    Heart attack Mother 48   Diabetes Father    Heart disease Father    Hyperlipidemia Father    Hypertension Father    Heart attack Father 71   Heart disease Maternal Grandmother    Heart attack Maternal Grandmother 88   Arthritis Maternal Grandmother    Early death Maternal Grandfather        MVA   Diabetes Paternal Grandmother    Hypertension Paternal Grandmother    Heart disease Paternal Grandmother    Heart attack Paternal Grandmother 31   Heart disease Paternal Grandfather    Heart attack Paternal Grandfather 92   Coronary artery disease Other    Glaucoma Other    Non-Hodgkin's lymphoma Paternal Uncle     Social History   Socioeconomic History   Marital status: Married    Spouse name: Not on file   Number of children: 3   Years of education: Not on file   Highest education level: Not on file  Occupational History   Occupation: Optician, dispensing: [employer]    Comment: at [employer] [facility]   Tobacco Use   Smoking status: Never   Smokeless tobacco: Never  Vaping Use   Vaping Use: Never used  Substance and Sexual Activity   Alcohol use: Yes    Comment: Occasionally   Drug use: No   Sexual activity: Yes    Partners: Male    Birth control/protection: Surgical  Other Topics Concern   Not on file  Social History Narrative   Married, 3 kids.    Lives in [place].   Educ: Masters deg nursing.   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   No tob/drugs.   Alc: social.   Social Determinants of Health   Financial Resource Strain: Not on file  Food Insecurity: No Food Insecurity (01/05/2022)   Hunger Vital Sign    Worried About Running Out of Food in the Last Year: Never true    Ran Out of Food in the Last Year: Never  true  Transportation Needs: No Transportation Needs (01/05/2022)   PRAPARE - Hydrologist (Medical): No    Lack of Transportation (Non-Medical): No  Physical Activity: Not on file  Stress: Not on file  Social Connections: Not on file  Current Medications:  Current Outpatient Medications:    acetaminophen (TYLENOL) 650 MG CR tablet, Take 650 mg by mouth every 8 (eight) hours as needed for pain., Disp: , Rfl:    apixaban (ELIQUIS) 2.5 MG TABS tablet, Take 1 tablet (2.5 mg total) by mouth 2 (two) times daily., Disp: 60 tablet, Rfl: 0   cholecalciferol (VITAMIN D3) 25 MCG (1000 UNIT) tablet, Take 2,000 Units by mouth daily., Disp: , Rfl:    dexamethasone (DECADRON) 4 MG tablet, Take 2 tablets (8 mg total) by mouth daily. Start the day after last cisplatin dose on day 6 of chemo x 3 days.Take with food., Disp: 30 tablet, Rfl: 1   famotidine (PEPCID) 10 MG tablet, Take 10 mg by mouth daily as needed for heartburn or indigestion., Disp: , Rfl:    furosemide (LASIX) 20 MG tablet, Take 1 tablet (20 mg total) by mouth daily., Disp: 30 tablet, Rfl: 1   LORazepam (ATIVAN) 0.5 MG tablet, Take 1 tablet (0.5 mg total) by mouth every 8 (eight) hours. (Patient taking differently: Take 0.5 mg by mouth every 8 (eight) hours as needed for anxiety.), Disp: 30 tablet, Rfl: 0   ondansetron (ZOFRAN) 8 MG tablet, Take 1 tablet (8 mg total) by mouth every 8 (eight) hours as needed for nausea or vomiting. Start on the third day after last cisplatin dose on day 8 of chemo, Disp: 30 tablet, Rfl: 1   pregabalin (LYRICA) 25 MG capsule, Take 1 capsule (25 mg total) by mouth at bedtime., Disp: 30 capsule, Rfl: 0   prochlorperazine (COMPAZINE) 10 MG tablet, Take 1 tablet (10 mg total) by mouth every 6 (six) hours as needed for nausea or vomiting., Disp: 30 tablet, Rfl: 1   simethicone (MYLICON) 80 MG chewable tablet, Chew by mouth., Disp: , Rfl:    traMADol (ULTRAM) 50 MG tablet, Take 1 tablet  (50 mg total) by mouth every 6 (six) hours as needed for severe pain. For AFTER surgery, do not take and drive, Disp: 30 tablet, Rfl: 0 No current facility-administered medications for this visit.  Facility-Administered Medications Ordered in Other Visits:    sodium chloride flush (NS) 0.9 % injection 10 mL, 10 mL, Intracatheter, PRN, Alvy Bimler, Ni, MD, 10 mL at 03/18/22 1548  Review of Systems: + shortness of breath Denies appetite changes, fevers, chills, fatigue, unexplained weight changes. Denies hearing loss, neck lumps or masses, mouth sores, ringing in ears or voice changes. Denies cough or wheezing.  Denies chest pain or palpitations. Denies leg swelling. Denies abdominal distention, pain, blood in stools, constipation, diarrhea, nausea, vomiting, or early satiety. Denies pain with intercourse, dysuria, frequency, hematuria or incontinence. Denies hot flashes, pelvic pain, vaginal bleeding or vaginal discharge.   Denies joint pain, back pain or muscle pain/cramps. Denies itching, rash, or wounds. Denies dizziness, headaches, numbness or seizures. Denies swollen lymph nodes or glands, denies easy bruising or bleeding. Denies anxiety, depression, confusion, or decreased concentration.  Physical Exam: BP 130/71 (BP Location: Left Arm, Patient Position: Sitting)   Pulse (!) 58   Temp 98.5 F (36.9 C) (Oral)   Resp 14   Wt 105 lb (47.6 kg)   SpO2 100%   BMI 21.21 kg/m  General: Alert, oriented, no acute distress. HEENT: Normocephalic, atraumatic, sclera anicteric. Chest: Clear to auscultation bilaterally.  No wheezes or rhonchi. Cardiovascular: Regular rate and rhythm, no murmurs. Abdomen: soft, nontender.  Normoactive bowel sounds.  No masses or hepatosplenomegaly appreciated.  Well-healed scar.  Ostomy pink and viable. Extremities: Grossly  normal range of motion.  Warm, well perfused.  No edema bilaterally. Skin: No rashes or lesions noted. GU: Normal appearing external  genitalia without erythema, excoriation, or lesions.  Speculum exam reveals cuff intact, some granulation tissue along the cuff line but no bleeding or discharge, suture still visible.  Bimanual exam reveals cuff intact, no fluctuance or tenderness with palpation.   Laboratory & Radiologic Studies:    Latest Ref Rng & Units 03/10/2022    1:23 PM 02/10/2022   10:21 AM 01/24/2022   12:27 PM  CBC  WBC 4.0 - 10.5 K/uL 13.8  66.8  23.5   Hemoglobin 12.0 - 15.0 g/dL 11.3  7.7  10.3   Hematocrit 36.0 - 46.0 % 35.0  23.4  31.2   Platelets 150 - 400 K/uL 582  570  293       Latest Ref Rng & Units 03/10/2022    1:23 PM 03/02/2022    8:31 AM 02/10/2022   10:21 AM  BMP  Glucose 70 - 99 mg/dL 88  79  153   BUN 6 - 20 mg/dL '13  12  9   '$ Creatinine 0.44 - 1.00 mg/dL 0.58  0.46  0.42   Sodium 135 - 145 mmol/L 142  139  135   Potassium 3.5 - 5.1 mmol/L 4.2  4.1  4.1   Chloride 98 - 111 mmol/L 104  102  97   CO2 22 - 32 mmol/L 32  31  30   Calcium 8.9 - 10.3 mg/dL 9.5  9.6  9.2    Assessment & Plan: [name] is a 52 y.o. 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  Restarted adjuvant chemotherapy with etoposide and cisplatin this week.  From a postoperative standpoint, the patient is doing very well.  She is meeting postoperative milestones.  Discussed continued expectations and restrictions.  We reviewed pathology again from surgery.  In the setting of complete resection for germ cell tumor, typical recommendation is for 3 cycles of adjuvant therapy.  Given that she received 2 cycles of adjuvant therapy with BAP, plan for 2-3 cycles of etoposide and cisplatin for adjuvant therapy.  Dropping Bleomycin to avoid pulmonary toxicity.  I will see the patient back after she finishes chemotherapy.  At that time, we will reimage and ensure well-healed  rectosigmoid anastomosis (she will need water-soluble rectal contrast).  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  Tentatively schedule her for a visit to see me in mid April with surgery in early May.  20 minutes of total time was spent for this patient encounter, including preparation, face-to-face counseling with the patient and coordination of care, and documentation of the encounter.  Jeral Pinch, MD  Division of Gynecologic Oncology  Department of Obstetrics and Gynecology  [facility]

## 2022-03-20 ENCOUNTER — Other Ambulatory Visit: Payer: Self-pay

## 2022-03-21 ENCOUNTER — Encounter: Payer: Self-pay | Admitting: Hematology and Oncology

## 2022-03-21 ENCOUNTER — Inpatient Hospital Stay (HOSPITAL_BASED_OUTPATIENT_CLINIC_OR_DEPARTMENT_OTHER): Payer: 59 | Admitting: Hematology and Oncology

## 2022-03-21 ENCOUNTER — Encounter: Payer: 59 | Admitting: Licensed Clinical Social Worker

## 2022-03-21 ENCOUNTER — Inpatient Hospital Stay: Payer: 59

## 2022-03-21 ENCOUNTER — Other Ambulatory Visit: Payer: Self-pay

## 2022-03-21 ENCOUNTER — Ambulatory Visit (HOSPITAL_BASED_OUTPATIENT_CLINIC_OR_DEPARTMENT_OTHER)
Admission: RE | Admit: 2022-03-21 | Discharge: 2022-03-21 | Disposition: A | Discharge status: Home / Self Care | Payer: 59 | Source: Ambulatory Visit

## 2022-03-21 ENCOUNTER — Other Ambulatory Visit: Payer: 59

## 2022-03-21 VITALS — BP 88/51 | HR 85 | Temp 97.5°F | Resp 18

## 2022-03-21 VITALS — BP 95/60 | HR 119 | Temp 97.4°F | Resp 18 | Ht 59.0 in | Wt 95.8 lb

## 2022-03-21 DIAGNOSIS — F411 Generalized anxiety disorder: Secondary | ICD-10-CM | POA: Insufficient documentation

## 2022-03-21 DIAGNOSIS — R198 Other specified symptoms and signs involving the digestive system and abdomen: Secondary | ICD-10-CM | POA: Diagnosis not present

## 2022-03-21 DIAGNOSIS — Z932 Ileostomy status: Secondary | ICD-10-CM

## 2022-03-21 DIAGNOSIS — Z7952 Long term (current) use of systemic steroids: Secondary | ICD-10-CM | POA: Insufficient documentation

## 2022-03-21 DIAGNOSIS — H9109 Ototoxic hearing loss, unspecified ear: Secondary | ICD-10-CM

## 2022-03-21 DIAGNOSIS — K219 Gastro-esophageal reflux disease without esophagitis: Secondary | ICD-10-CM | POA: Insufficient documentation

## 2022-03-21 DIAGNOSIS — E860 Dehydration: Secondary | ICD-10-CM

## 2022-03-21 DIAGNOSIS — Z79899 Other long term (current) drug therapy: Secondary | ICD-10-CM | POA: Insufficient documentation

## 2022-03-21 DIAGNOSIS — C569 Malignant neoplasm of unspecified ovary: Secondary | ICD-10-CM | POA: Diagnosis not present

## 2022-03-21 DIAGNOSIS — Z8261 Family history of arthritis: Secondary | ICD-10-CM | POA: Insufficient documentation

## 2022-03-21 DIAGNOSIS — R197 Diarrhea, unspecified: Secondary | ICD-10-CM | POA: Insufficient documentation

## 2022-03-21 DIAGNOSIS — Z7901 Long term (current) use of anticoagulants: Secondary | ICD-10-CM | POA: Insufficient documentation

## 2022-03-21 DIAGNOSIS — Z5111 Encounter for antineoplastic chemotherapy: Secondary | ICD-10-CM | POA: Diagnosis not present

## 2022-03-21 DIAGNOSIS — L24B3 Irritant contact dermatitis related to fecal or urinary stoma or fistula: Secondary | ICD-10-CM

## 2022-03-21 LAB — CMP (CANCER CENTER ONLY)
ALT: 38 U/L (ref 0–44)
AST: 16 U/L (ref 15–41)
Albumin: 4 g/dL (ref 3.5–5.0)
Alkaline Phosphatase: 69 U/L (ref 38–126)
Anion gap: 10 (ref 5–15)
BUN: 23 mg/dL — ABNORMAL HIGH (ref 6–20)
CO2: 28 mmol/L (ref 22–32)
Calcium: 9.9 mg/dL (ref 8.9–10.3)
Chloride: 99 mmol/L (ref 98–111)
Creatinine: 0.65 mg/dL (ref 0.44–1.00)
GFR, Estimated: >60 mL/min (ref >60)
Glucose, Bld: 86 mg/dL (ref 70–99)
Potassium: 4.6 mmol/L (ref 3.5–5.1)
Sodium: 137 mmol/L (ref 135–145)
Total Bilirubin: 0.5 mg/dL (ref 0.3–1.2)
Total Protein: 6.7 g/dL (ref 6.5–8.1)

## 2022-03-21 LAB — CBC WITH DIFFERENTIAL (CANCER CENTER ONLY)
Abs Immature Granulocytes: 0.09 10*3/uL — ABNORMAL HIGH (ref 0.00–0.07)
Basophils Absolute: 0 10*3/uL (ref 0.0–0.1)
Basophils Relative: 0 %
Eosinophils Absolute: 0.2 10*3/uL (ref 0.0–0.5)
Eosinophils Relative: 2 %
HCT: 39.8 % (ref 36.0–46.0)
Hemoglobin: 13.4 g/dL (ref 12.0–15.0)
Immature Granulocytes: 1 %
Lymphocytes Relative: 13 %
Lymphs Abs: 1.2 10*3/uL (ref 0.7–4.0)
MCH: 29.4 pg (ref 26.0–34.0)
MCHC: 33.7 g/dL (ref 30.0–36.0)
MCV: 87.3 fL (ref 80.0–100.0)
Monocytes Absolute: 0 10*3/uL — ABNORMAL LOW (ref 0.1–1.0)
Monocytes Relative: 0 %
Neutro Abs: 7.8 10*3/uL — ABNORMAL HIGH (ref 1.7–7.7)
Neutrophils Relative %: 84 %
Platelet Count: 208 10*3/uL (ref 150–400)
RBC: 4.56 MIL/uL (ref 3.87–5.11)
RDW: 18 % — ABNORMAL HIGH (ref 11.5–15.5)
Smear Review: NORMAL
WBC Count: 9.3 10*3/uL (ref 4.0–10.5)
nRBC: 0 % (ref 0.0–0.2)

## 2022-03-21 LAB — SAMPLE TO BLOOD BANK

## 2022-03-21 MED ORDER — HEPARIN SOD (PORK) LOCK FLUSH 100 UNIT/ML IV SOLN
500.0000 [IU] | Freq: Once | INTRAVENOUS | Status: AC | PRN
  Administered 2022-03-21: 500 [IU]

## 2022-03-21 MED ORDER — SODIUM CHLORIDE 0.9 % IV SOLN: Freq: Once | INTRAVENOUS | Status: AC

## 2022-03-21 MED ORDER — SODIUM CHLORIDE 0.9% FLUSH
10.0000 mL | Freq: Once | INTRAVENOUS | Status: AC | PRN
  Administered 2022-03-21: 10 mL

## 2022-03-21 MED ORDER — SODIUM CHLORIDE 0.9% FLUSH
10.0000 mL | Freq: Once | INTRAVENOUS | Status: AC
  Administered 2022-03-21: 10 mL

## 2022-03-21 NOTE — Assessment & Plan Note (Signed)
She tolerated chemotherapy better except that now she has developed some diarrhea and some dehydration She does not need transfusion support She will return here twice a week for the next 2 weeks for supportive care and she will get another cycle of treatment schedule in 2 weeks

## 2022-03-21 NOTE — Progress Notes (Signed)
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[facility]) [name] tolerated chemotherapy better except that now [name] has developed some diarrhea and some dehydration [name] does not need transfusion support [name] will return here twice a week for the next 2 weeks for supportive care and [name] will get another cycle of treatment schedule in 2 weeks  Hearing loss [name] denies problem Observe closely  Dehydration Due to recent diarrhea I recommend IV fluid support and [name] agrees  Diarrhea Likely due to side effects of chemotherapy Continue supportive care I recommend the patient also take Imodium  Orders Placed This Encounter  Procedures   CT Pelvis W Contrast    Need rectal contrast    Standing Status:   Future    Standing Expiration Date:   03/21/2023    Scheduling Instructions:     Need rectal contrast    Order Specific Question:   If indicated for the ordered procedure, I authorize the administration of contrast media per Radiology protocol    Answer:   Yes    Order Specific Question:   Does the patient have a contrast media/X-ray dye allergy?    Answer:   No    Order Specific Question:   Is patient pregnant?    Answer:   No    Order Specific Question:   Preferred imaging location?    Answer:   [facility]    Order Specific Question:   Is Oral Contrast requested for this exam?    Answer:   Yes, Per Radiology protocol   CBC with Differential/Platelet    Standing Status:   Standing    Number of Occurrences:   22    Standing Expiration Date:   03/22/2023   Sample to Blood Bank    Standing Status:   Standing    Number of Occurrences:   33    Standing Expiration  Date:   03/22/2023    All questions were answered. The patient knows to call the clinic with any problems, questions or concerns. The total time spent in the appointment was 30 minutes encounter with patients including review of chart and various tests results, discussions about plan of care and coordination of care plan   Heath Lark, MD 03/21/2022 11:07 AM  INTERVAL HISTORY: Please see below for problem oriented charting. [name] returns for treatment follow-up after chemotherapy for supportive care [name] started to develop diarrhea over the past 24 hours and felt a little dizzy/lightheaded this morning Denies nausea Denies worsening tinnitus or hearing loss  REVIEW OF SYSTEMS:   Constitutional: Denies fevers, chills or abnormal weight loss Eyes: Denies blurriness of vision Ears, nose, mouth, throat, and face: Denies mucositis or sore throat Respiratory: Denies cough, dyspnea or wheezes Cardiovascular: Denies palpitation, chest discomfort or lower extremity swelling Skin: Denies abnormal skin rashes Lymphatics: Denies new lymphadenopathy or easy bruising Neurological:Denies numbness, tingling or new weaknesses Behavioral/Psych: Mood is stable, no new changes  All other systems were reviewed with the patient and are negative.  I have reviewed the past medical history, past surgical history, social history and family history with the patient and they are unchanged from previous note.  ALLERGIES:  is allergic to latex, sulfa antibiotics,  sulfamethoxazole-trimethoprim, and septra [bactrim].  MEDICATIONS:  Current Outpatient Medications  Medication Sig Dispense Refill   acetaminophen (TYLENOL) 650 MG CR tablet Take 650 mg by mouth every 8 (eight) hours as needed for pain.     apixaban (ELIQUIS) 2.5 MG TABS tablet Take 1 tablet (2.5 mg total) by mouth 2 (two) times daily. 60 tablet 0   cholecalciferol (VITAMIN D3) 25 MCG (1000 UNIT) tablet Take 2,000 Units by mouth daily.     dexamethasone  (DECADRON) 4 MG tablet Take 2 tablets (8 mg total) by mouth daily. Start the day after last cisplatin dose on day 6 of chemo x 3 days.Take with food. 30 tablet 1   famotidine (PEPCID) 10 MG tablet Take 10 mg by mouth daily as needed for heartburn or indigestion.     furosemide (LASIX) 20 MG tablet Take 1 tablet (20 mg total) by mouth daily. 30 tablet 1   LORazepam (ATIVAN) 0.5 MG tablet Take 1 tablet (0.5 mg total) by mouth every 8 (eight) hours. (Patient taking differently: Take 0.5 mg by mouth every 8 (eight) hours as needed for anxiety.) 30 tablet 0   ondansetron (ZOFRAN) 8 MG tablet Take 1 tablet (8 mg total) by mouth every 8 (eight) hours as needed for nausea or vomiting. Start on the third day after last cisplatin dose on day 8 of chemo 30 tablet 1   pregabalin (LYRICA) 25 MG capsule Take 1 capsule (25 mg total) by mouth at bedtime. 30 capsule 0   prochlorperazine (COMPAZINE) 10 MG tablet Take 1 tablet (10 mg total) by mouth every 6 (six) hours as needed for nausea or vomiting. 30 tablet 1   simethicone (MYLICON) 80 MG chewable tablet Chew by mouth.     traMADol (ULTRAM) 50 MG tablet Take 1 tablet (50 mg total) by mouth every 6 (six) hours as needed for severe pain. For AFTER surgery, do not take and drive 30 tablet 0   No current facility-administered medications for this visit.    SUMMARY OF ONCOLOGIC HISTORY: Oncology History  Malignant germ cell tumor of ovary ([place])  12/01/2021 Tumor Marker   CA-125 is elevated at 285, [place]-fet[place] [address], beta-hCG is elevated at 116   12/02/2021 Imaging   MRI pelvis  1. 21.1 by 10.9 by 18.7 cm abdominopelvic mass, with a satellite mass in the right upper quadrant anteriorly, and moderate ascites with tumor deposits along the pelvic ascites posteriorly. The mass could be arising from right or left ovary, or both. The mass displaces the bowel and IVC, but there is no IVC thrombus or pelvic DVT identified. Appearance strongly favors  ovarian malignancy with peritoneal spread of tumor. 2. Additional satellite mass above the liver is partially included, along with tumor deposits along the ascites. 3. Low-level edema along the lateral abdominal wall musculature. 4. Flattening of the IVC at the level of the aortic bifurcation due to the large abdominopelvic tumor. No findings of pelvic DVT.     12/07/2021 Imaging   CT abdomen and pelvis 1. Large central abdominal and pelvic mass consistent with known malignancy. This is incompletely visualized by this CT of the abdomen, but was seen on recent pelvic MRI. 2. 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 Right omental implant consistent with metastatic disease. Small amount of abdominal ascites. 3. No evidence for bowel or ureteral obstruction. 4. Aortic Atherosclerosis (ICD10-I70.0). 5. Chest findings dictated separately.   12/07/2021 Imaging  CT chest 1. Negative for pulmonary embolism. And no acute or metastatic process identified in the Chest. 2. Partially visible liver mass and ascites, staging CT Abdomen today is reported separately.     12/07/2021 Procedure   Successful placement of a right internal jugular approach power injectable Port-A-Cath. The catheter is ready for immediate use.   12/07/2021 Procedure   Ultrasound-guided biopsy of right upper quadrant mass.    12/08/2021 Initial Diagnosis   Ovarian cancer ([place])   12/08/2021 Cancer Staging   Staging form: Ovary, Fallopian Tube, and Primary Peritoneal Carcinoma, AJCC 8th Edition - Clinical stage from 12/08/2021: FIGO Stage IIIC (cT3c, cN0, cM0) - Signed by Heath Lark, MD on 12/10/2021 Stage prefix: Initial diagnosis   12/20/2021 - 12/28/2021 Chemotherapy   Patient is on Treatment Plan : Ovarian germ cell tumor q21d x 3 Cycles     01/03/2022 Imaging   IMPRESSION: 1. Large central abdominopelvic mass compatible with  malignancy, 3450 cubic cm in volume. This may be arising from the ovaries with slight eccentricity to the right in the pelvis. 2. Mild increase in size of the right omental tumor deposit. 3. Mild increase in size of the subcapsular mass along the dome of the right hepatic lobe, with suspected hepatic invasion. 4. Scattered malignant ascites. 5. No dilated bowel to suggest obstruction. 6. 2 mm left kidney lower pole nonobstructive renal calculus. 7. Mild abdominal aortic atherosclerotic vascular disease.   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  Postoperative Diagnosis: Status post R0 cytoreductive surgery and heating intraperitoneal chemotherapy  Procedures: LAPAROSCOPY, ABDOMEN, PERITONEUM, & OMENTUM, DIAGNOSTIC, W/WO COLLECTION SPECIMEN(S) BY BRUSHING OR WASHING  EXCISION/DESTRUCTION, OPEN, INTRA-ABD TUMOR/CYST/ENDOMETRIOMA, 1+ PERITONEAL/MESENTERI/RETROPERIT 5CM OR <COLECTOMY, PARTIAL; WITH ANASTOMOSIS COLOSTOMY OR SKIN LEVEL CECOSTOMY SPLENECTOMY; TOT (SEPART PROC) EXPLORATORY LAPAROTOMY, EXPLORATORY CELIOTOMY WITH OR WITHOUT BIOPSY(S) HYPERTHERMIA, EXTERNALLY GENERATED; DEEP CHEMOTHERAPY ADMINISTRATION INTO THE PERITONEAL CAVITY VIA INDWELLING PORT OR CATHETER RESECTION (INITIAL) OVARIAN, TUBAL/PRIM PERITONEAL MALIG W/BIL S&O/OMENTECT; W/RAD DISSECTION FOR DEBULKING CHOLECYSTECTOMY APPENDECTOMY OMENTECTOMY, EPIPLOECTOMY, RESECTION OF OMENTUM Procedures: Exploratory laparotomy, total abdominal hysterectomy with radical dissection, bilateral ureterolysis, bilateral salpingo-oophorectomy, posterior cul-de-sac peritonectomy, tumor debulking by Dr Berline Lopes with Gynecology Oncology Dr Dayton Scrape with surgical oncology: Diagnostic laparoscopy, exploratory laparotomy, low anterior resection with primary end-to-end anastomosis, enterolysis, cholecystectomy, tumor capsule tumor debulking, splenectomy, appendectomy,  omentectomy, diverting loop ileostomy formation  Surgeon: Jeral Pinch, MD  Findings: On diagnostic laparoscopy, only able to clearly visualize the left upper quadrant. Adhesions of bowel in the right upper quadrant limiting survey, minimal ascites noted in the pelvis, scattered tumor plaques visualized on left diaphragm. Given these findings, the decision was made to convert to exploratory laparotomy. Multiple loops of small bowel encased in a Cancn like structure in the midline with multiple filmy adhesions tethering to the superior aspect of the a large pelvic mass. Multicystic and necrotic pelvic mass originating from left ovary, approximately 18 x 20 cm incorporated within the left broad ligament with the left tube draped overlying the left ovarian mass. Bilateral tubes normal in appearance, normal-appearing uterus with smooth serosa. Right adnexa similarly with approximate 10 x 8 multicystic and necrotic mass. Diffuse tumor plaques encompassing posterior cul-de-sac peritoneum. Rectosigmoid densely adhered to posterior aspect of left adnexal mass requiring dissection and ultimately resection. Scattered treated subcentimeter nodules along small bowel mesentery. 5 x 5 cm necrotic and multicystic tumor at the level of the hepatic flexure. Enlarged similar multicystic necrotic tumor adherent between  the right diaphragm and the liver without any evidence of liver invasion and tumor encased solely within the liver capsule. Multiple tumor nodules along the spleen requiring splenectomy. Omentum overall fairly normal in appearance with only scattered small tumor deposits, no omental cake was encountered. Please see surgical oncology operative note for further details regarding operative findings for their portion of the procedure.  Specimens: ID Type Source Tests Collected by Time Destination 1 : Abdominal fat pad Tissue Abdomen SURGICAL PATHOLOGY EXAM Marthann Schiller, MD 02/15/2022 [phone] 2 : Left  adnexa Tissue Abdomen SURGICAL PATHOLOGY EXAM Marthann Schiller, MD 02/15/2022 [phone] : right adnexa Tissue Abdomen SURGICAL PATHOLOGY EXAM Marthann Schiller, MD 02/15/2022 [phone] : Left perimetrium Tissue Uterus SURGICAL PATHOLOGY EXAM Lafonda Mosses, MD 02/15/2022 [phone] 5 : Uterus and cervix Tissue Uterus SURGICAL PATHOLOGY EXAM Marthann Schiller, MD 02/15/2022 1018 6 : Low Anterior Resection Tissue Abdomen SURGICAL PATHOLOGY EXAM Marthann Schiller, MD 02/15/2022 1055 7 : Right peritoneum Tissue Abdomen SURGICAL PATHOLOGY EXAM Marthann Schiller, MD 02/15/2022 1058 8 : Right retroperitoneal mass Tissue Abdomen SURGICAL PATHOLOGY EXAM Marthann Schiller, MD 02/15/2022 1101 9 : Omentum Tissue Omentum SURGICAL PATHOLOGY EXAM Marthann Schiller, MD 02/15/2022 1130 10 : Gallbladder Tissue Gallbladder SURGICAL PATHOLOGY EXAM Marthann Schiller, MD 02/15/2022 1143 11 : Right diaphragm Tissue Diaphragm SURGICAL PATHOLOGY EXAM Marthann Schiller, MD 02/15/2022 1143 12 : Left diaphragm nodule Tissue Diaphragm SURGICAL PATHOLOGY EXAM Marthann Schiller, MD 02/15/2022 1157 13 : Spleen Tissue Spleen SURGICAL PATHOLOGY EXAM Marthann Schiller, MD 02/15/2022 1204 14 : Appendix Tissue Appendix SURGICAL PATHOLOGY EXAM Marthann Schiller, MD 02/15/2022 1213 15 : Small bowel mesenteric nodules Tissue Small Bowel SURGICAL PATHOLOGY EXAM Marthann Schiller, MD 02/15/2022 1215 16 : Colon mesenteric nodule Tissue Colon SURGICAL PATHOLOGY EXAM Marthann Schiller, MD 02/15/2022 1221 17 : Hepatic flexure mass Tissue Liver SURGICAL PATHOLOGY EXAM Marthann Schiller, MD 02/16/2438 1027  Complications: None  Indications for Procedure: [name] is a 52 y.o. 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 Prior to the procedure, all risks, benefits, and alternatives were  discussed and informed surgical consent was signed.  Procedure: Patient was taken to the operating room where general anesthesia was achieved. [name] was positioned in dorsal lithotomy and prepped and draped per the surgical oncology team. A foley catheter was inserted into the bladder. A 5 mm skin incision was made in the left upper quadrant at Palmer's point, the abdomen was then entered with direct Optiview entry and the abdomen was insufflated. Findings were noted as above.  The laparoscope was then removed from the abdomen, the pneumoperitoneum was maintained. A vertical midline incision was made with the scalpel and the abdomen was entered sharply. The abdomen and pelvis were surveyed with findings as documented above. A wound protector was placed followed by the [facility] retractor.  The enlarged left adnexal mass was then carefully removed of its bilateral sidewall attachments using a mixture of blunt and cautery dissection. Multiple small bowel loops were adhered to the superior and posterior aspect of the mass with filmy adhesions which were carefully bluntly resected. The rectosigmoid was noted to be densely adhered to the posterior aspect of the pelvic mass. Dissection was attempted using mixture of blunt dissection as well as sharp dissection with the Metzenbaum scissors, and unavoidable enterotomy of approximately 4 cm was made to the anterior  surface of the rectosigmoid. This enterotomy was then closed with 4-0 PDS in a running fashion to minimize contamination. Further dissection removing the remainder of the rectosigmoid from the posterior aspect of the pelvic mass was then undertaken. The pelvic mass was then able to be fully elevated from the pelvis. The left infundibulopelvic ligament was able to be isolated with the left ureter clearly visualized below. The left ureter was further dissected away from the medial leaf of the broad ligament, and a vessel loop was placed. The left  infundibulopelvic ligament was then isolated, clamped, and transected using the LigaSure device as well as and suture-ligated. The left utero-ovarian ligament was then isolated, clamped, cauterized and transected. The left pelvic mass was then removed entirely.  Attention was then turned to the right. An additional enlarged multicystic right adnexal mass was noted, this was carefully elevated from the pelvis using blunt dissection. The right round ligament was then grasped, elevated, and transected. The broad ligament was then opened posteriorly. The ureter was identified and the right infundibulopelvic ligament was then isolated, clamped, transected, and tied. The right utero ovarian ligament was then isolated, clamped, and transected using the LigaSure device and the right adnexal mass was removed.  Attention then was turned toward the hysterectomy. A sponge stick was placed within the vagina. The right broad ligament dissection was opened anteriorly and the bladder flap was developed. The left round ligament was then further transected, and the broad ligament opened posteriorly and anteriorly to complete the bladder flap. The right uterine artery was skeletonized, clamped, transected, and suture-ligated. There was significant parametrial tumor involvement on the left side, and therefore a radical dissection was required to isolate the blood supply. The left ureter was further dissected down to the level near the trigone. The left uterine artery was traced to its origin, isolated, and clamped with vascular clips. Similarly the left uterine vein due to significant necrotic tumor involvement was friable. Detachment was similarly carefully isolated, and clipped with vascular clips. Sequential clamps were then used to transect the remainder of the broad and cardinal ligaments bilaterally, with each pedicle being suture-ligated. A large right angle clamp was then able to be placed below the cervix, and the uterus  and cervix were amputated from the vagina. The vaginal cuff was then closed with several figure-of-eight sutures using 0 Vicryl.    02/15/2022 Pathology Results   A: Abdominal fat pad, excision - Fibroadipose tissue with residual mixed malignant germ cell tumor with treatment effect   B: Left adnexa, salpingo-oophorectomy - Residual mixed malignant germ cell tumor with treatment effect, [id number] (See Comment and Synoptic report) - FIGO stage IIIC - Fallopian tubes with no tumor present   C: Right adnexa, salpingo-oophorectomy - Residual mixed malignant germ cell tumor with treatment effect (See Synoptic report) - Fallopian tubes not identified   D: Left parametrium, biopsy - Fibroadipose tissue with residual mixed malignant germ cell tumor with treatment effect   E: Uterus and cervix, hysterectomy - Right adnexal soft tissue with residual mixed malignant germ cell tumor with treatment effect   Myometrium: - Adenomyosis - Leiomyomata measuring up to 0.7 cm - Extensive chronic serositis   Cervix:  - Ectocervix and endocervix with chronic cervicitis    Endometrium: - Inactive endometrium   F: Low anterior resection - Residual mixed malignant  germ cell tumor with treatment effect involving colonic serosa and pericolonic adipose tissue - Proximal and distal colonic resection margins with chronic serositis, but negative for tumor  G: Right peritoneum, biopsy - Fibroadipose tissue with residual mixed malignant germ cell tumor with treatment effect   H: Right retroperitoneal mass, excision - Fibroadipose tissue with residual mixed malignant germ cell tumor with treatment effect   I: Omentum, omentectomy - Fibroadipose tissue with residual mixed malignant germ cell tumor with treatment effect   J: Gallbladder, cholecystectomy - Gallbladder with mild chronic serositis and fibrous adhesion   K: Right diaphragm, excision - Fibrous tissue with residual mixed malignant germ cell  tumor with treatment effect - Hepatic tissue with no tumor present   L: Left diaphragm nodule, biopsy - Fibrous tissue with residual mixed malignant germ cell tumor with treatment effect   M: Spleen, splenectomy - Residual mixed malignant germ cell tumor with treatment effect involving splenic capsule   N: Appendix, appendectomy - Appendix with fibrous obliteration at the tip - Periappendiceal adipose tissue with acute and chronic inflammation   O: Small bowel mesenteric nodules, excision - Fibroadipose tissue with residual mixed malignant germ cell tumor with treatment effect   P: Mesenteric nodules, excision - Fibroadipose tissue with residual mixed malignant germ cell tumor with treatment effect   Q: Hepatic flexure mass, excision - Fibroadipose tissue with residual mixed malignant germ cell tumor with treatment effect   R: Anvil and donut, biopsy - Benign segment of colon with acute and chronic inflammation and fibrous adhesion  SPECIMEN    Procedure:    Radical hysterectomy     Procedure:    Omentectomy     Procedure:    Peritoneal tumor debulking     Hysterectomy Type:    Abdominal     Specimen Integrity:          Right Ovary Integrity:    Capsule intact     Specimen Integrity:          Left Ovary Integrity:    Capsule intact   TUMOR    Tumor Site:    Bilateral ovaries     Tumor Size:    Greatest Dimension (Centimeters): 19.5 cm     Histologic Type:    Mixed malignant germ cell tumor:  yolk sac tumor, mature teratoma, mature neural elements, immature neural tissue, and immature cartilage     Ovarian Surface Involvement:    Present, right and left     Other Tissue / Organ Involvement:    Right ovary     Other Tissue / Organ Involvement:    Left ovary     Other Tissue / Organ Involvement:    Right fallopian tube     Other Tissue / Organ Involvement:    Omentum     Other Tissue / Organ Involvement:    Diaphragm and splenic capsule     Largest Extrapelvic Peritoneal  Focus:    Macroscopic (greater than 2 cm): Splenic capsule     Peritoneal / Ascitic Fluid Involvement:    Not submitted / unknown   REGIONAL LYMPH NODES     Regional Lymph Node Status:    Not applicable (no regional lymph nodes submitted or found)   DISTANT METASTASIS     Distant Site(s) Involved:    Diaphragm, splenic capsule   pTNM CLASSIFICATION (AJCC 8th Edition)     Reporting of pT, pN, and (when applicable) pM categories is based on information available to the pathologist at the time the report is issued. As per the AJCC (Chapter 1, 8th Ed.) it is the managing physician's responsibility to establish the final pathologic stage based upon all  pertinent information, including but potentially not limited to this pathology report.     Modified Classification:    y     pT Category:    pT3c     pN Category:    pN not assigned (no nodes submitted or found)     pM Category:    pM1   FIGO STAGE     FIGO Stage:    IIIC   COMMENT:  The needle core biopsies show a heterogeneous combination of elements including abundant neural tissue with atypical and immature features. There are also atypical glandular epithelial structures and focal yolk sac elements with microcystic pattern.  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  Immunohistochemistry is performed for cytokeratin AE1/AE3, CD117, CD30, G FAP, glypican-3, OCT3/4, synaptophysin, S100 and CD30.     03/14/2022 -  Chemotherapy   Patient is on Treatment Plan : TESTICULAR EP D1-5 q21d       PHYSICAL EXAMINATION: ECOG PERFORMANCE STATUS: 1 - Symptomatic but completely ambulatory  Vitals:   03/21/22 0859  BP: 95/60  Pulse: (!) 119  Resp: 18  Temp: (!) 97.4 F (36.3 C)  SpO2: 99%   Filed Weights   03/21/22 0859  Weight: 95 lb 12.8 oz (43.5 kg)    GENERAL:alert, no distress and comfortable NEURO: alert & oriented x 3 with fluent speech, no focal  motor/sensory deficits.  [name] felt a little dizzy  LABORATORY DATA:  I have reviewed the data as listed    Component Value Date/Time   NA 137 03/21/2022 0835   K 4.6 03/21/2022 0835   CL 99 03/21/2022 0835   CO2 28 03/21/2022 0835   GLUCOSE 86 03/21/2022 0835   BUN 23 (H) 03/21/2022 0835   CREATININE 0.65 03/21/2022 0835   CREATININE 0.75 03/23/2011 1550   CALCIUM 9.9 03/21/2022 0835   PROT 6.7 03/21/2022 0835   ALBUMIN 4.0 03/21/2022 0835   AST 16 03/21/2022 0835   ALT 38 03/21/2022 0835   ALKPHOS 69 03/21/2022 0835   BILITOT 0.5 03/21/2022 0835   GFRNONAA >60 03/21/2022 0835    No results found for: "SPEP", "UPEP"  Lab Results  Component Value Date   WBC 9.3 03/21/2022   NEUTROABS 7.8 (H) 03/21/2022   HGB 13.4 03/21/2022   HCT 39.8 03/21/2022   MCV 87.3 03/21/2022   PLT 208 03/21/2022      Chemistry      Component Value Date/Time   NA 137 03/21/2022 0835   K 4.6 03/21/2022 0835   CL 99 03/21/2022 0835   CO2 28 03/21/2022 0835   BUN 23 (H) 03/21/2022 0835   CREATININE 0.65 03/21/2022 0835   CREATININE 0.75 03/23/2011 1550      Component Value Date/Time   CALCIUM 9.9 03/21/2022 0835   ALKPHOS 69 03/21/2022 0835   AST 16 03/21/2022 0835   ALT 38 03/21/2022 0835   BILITOT 0.5 03/21/2022 0835

## 2022-03-21 NOTE — Assessment & Plan Note (Signed)
Likely due to side effects of chemotherapy Continue supportive care I recommend the patient also take Imodium

## 2022-03-21 NOTE — Assessment & Plan Note (Signed)
Due to recent diarrhea I recommend IV fluid support and she agrees

## 2022-03-21 NOTE — Discharge Instructions (Signed)
Continue 1 piece convex Stoma powder Skin prep Barrier ring Prefer coloplast Will update with [facility] for enrollment again.

## 2022-03-21 NOTE — Assessment & Plan Note (Signed)
She denies problem Observe closely

## 2022-03-21 NOTE — Progress Notes (Signed)
[facility]   Reason for visit:  RLQ ileostomy  recently became dehydrated and had to get IV fluids.  Feeling much better HPI:  Ovarian tumor Past Medical History:  Diagnosis Date   Arthritis    Endometriosis    Family history of breast cancer in mother    at age 51   GAD (generalized anxiety disorder)    lexapro helped 2017 (Dr. Emmit Alexanders did have extra stress of the Master's degree program on her at that time.  Got counseling.       GERD (gastroesophageal reflux disease)    Gestational diabetes    Kidney stones    Low back pain    Dr. Terrilee Files.   Neck pain    and left shoulder pain: managed by osteopathic manipulation by Dr. Terrilee Files. ?cervical radiculopathy   PAC (premature atrial contraction)    Family History  Problem Relation Age of Onset   Rheum arthritis Mother    Stroke Mother    Hypertension Mother    Heart disease Mother    Arthritis Mother    Breast cancer Mother    Heart attack Mother 30   Diabetes Father    Heart disease Father    Hyperlipidemia Father    Hypertension Father    Heart attack Father 51   Heart disease Maternal Grandmother    Heart attack Maternal Grandmother 83   Arthritis Maternal Grandmother    Early death Maternal Grandfather        MVA   Diabetes Paternal Grandmother    Hypertension Paternal Grandmother    Heart disease Paternal Grandmother    Heart attack Paternal Grandmother 53   Heart disease Paternal Grandfather    Heart attack Paternal Grandfather 62   Coronary artery disease Other    Glaucoma Other    Non-Hodgkin's lymphoma Paternal Uncle    Allergies  Allergen Reactions   Latex Other (See Comments)    Sensitivity only   Sulfa Antibiotics Hives and Other (See Comments)   Sulfamethoxazole-Trimethoprim Rash   Septra [Bactrim] Rash   Current Outpatient Medications  Medication Sig Dispense Refill Last Dose   acetaminophen (TYLENOL) 650 MG CR tablet Take 650 mg by mouth every 8 (eight) hours as needed  for pain.      apixaban (ELIQUIS) 2.5 MG TABS tablet Take 1 tablet (2.5 mg total) by mouth 2 (two) times daily. 60 tablet 0    cholecalciferol (VITAMIN D3) 25 MCG (1000 UNIT) tablet Take 2,000 Units by mouth daily.      dexamethasone (DECADRON) 4 MG tablet Take 2 tablets (8 mg total) by mouth daily. Start the day after last cisplatin dose on day 6 of chemo x 3 days.Take with food. 30 tablet 1    famotidine (PEPCID) 10 MG tablet Take 10 mg by mouth daily as needed for heartburn or indigestion.      furosemide (LASIX) 20 MG tablet Take 1 tablet (20 mg total) by mouth daily. 30 tablet 1    LORazepam (ATIVAN) 0.5 MG tablet Take 1 tablet (0.5 mg total) by mouth every 8 (eight) hours. (Patient taking differently: Take 0.5 mg by mouth every 8 (eight) hours as needed for anxiety.) 30 tablet 0    ondansetron (ZOFRAN) 8 MG tablet Take 1 tablet (8 mg total) by mouth every 8 (eight) hours as needed for nausea or vomiting. Start on the third day after last cisplatin dose on day 8 of chemo 30 tablet 1    pregabalin (LYRICA) 25  MG capsule Take 1 capsule (25 mg total) by mouth at bedtime. 30 capsule 0    prochlorperazine (COMPAZINE) 10 MG tablet Take 1 tablet (10 mg total) by mouth every 6 (six) hours as needed for nausea or vomiting. 30 tablet 1    simethicone (MYLICON) 80 MG chewable tablet Chew by mouth.      traMADol (ULTRAM) 50 MG tablet Take 1 tablet (50 mg total) by mouth every 6 (six) hours as needed for severe pain. For AFTER surgery, do not take and drive 30 tablet 0    No current facility-administered medications for this encounter.   ROS  Review of Systems  Constitutional:  Positive for fatigue.       Recent dehydration  Gastrointestinal:  Positive for diarrhea.       RLQ ileostomy  Skin:  Positive for color change and rash.       Peristomal breakdown  Psychiatric/Behavioral:  The patient is nervous/anxious.    Vital signs:  There were no vitals taken for this visit. Exam:  Physical  Exam Constitutional:      Appearance: Normal appearance.  Abdominal:     Palpations: Abdomen is soft.     Comments: RLQ ileostomy  Skin:    General: Skin is warm and dry.     Findings: Rash present.  Neurological:     Mental Status: She is alert and oriented to person, place, and time.  Psychiatric:        Mood and Affect: Mood normal.        Behavior: Behavior normal.     Stoma type/location:  RLQ ileostomy Stomal assessment/size:  1 1/4" flush stoma Peristomal assessment:  breakdown improving, recently had increase in liquid stool due to chemotherapy.   Treatment options for stomal/peristomal skin: stoma powder skin prep  barrier ring and 1 piece coloplast pouch Output: liquid brown stool Ostomy pouching: 1pc. coloplast Education provided:   has some ostomy supplies she can't use that were provided by [facility].  Encouraged to contact them regarding possible return Supplies provided by [facility]    Impression/dx  Contact dermatitis High output ileostomy Discussion  Monitor for dehydration Pouch changes as needed.  Plan  Back as needed     Visit time: 40 minutes.   Maple Hudson FNP-BC

## 2022-03-22 ENCOUNTER — Telehealth: Payer: Self-pay | Admitting: Hematology and Oncology

## 2022-03-22 ENCOUNTER — Other Ambulatory Visit: Payer: Self-pay | Admitting: Hematology and Oncology

## 2022-03-22 DIAGNOSIS — C569 Malignant neoplasm of unspecified ovary: Secondary | ICD-10-CM

## 2022-03-22 LAB — AFP TUMOR MARKER: AFP, Serum, Tumor Marker: 7.9 ng/mL — ABNORMAL HIGH (ref 0.0–6.4)

## 2022-03-22 LAB — CA 125: Cancer Antigen (CA) 125: 348 U/mL — ABNORMAL HIGH (ref 0.0–38.1)

## 2022-03-22 NOTE — Telephone Encounter (Signed)
Spoke with patient confirming all upcoming appointments  

## 2022-03-23 ENCOUNTER — Telehealth: Payer: Self-pay

## 2022-03-23 ENCOUNTER — Other Ambulatory Visit: Payer: Self-pay | Admitting: Hematology and Oncology

## 2022-03-23 DIAGNOSIS — R198 Other specified symptoms and signs involving the digestive system and abdomen: Secondary | ICD-10-CM | POA: Insufficient documentation

## 2022-03-23 NOTE — Telephone Encounter (Signed)
I agree with pepcid, she can also take tums as needed

## 2022-03-23 NOTE — Telephone Encounter (Signed)
Called to see how she is doing. She is doing okay today and does not feel that she needs IV fluids. She is having less liquids out of ostomy. She is aware of appt on 2/9.  For the last 2 days ago in the afternoon she is having terrible acid reflux and heart burn pain. About 1/4 of the previous meal comes back up with the acid reflux. She started taking Pepcid BID yesterday, she was taking it daily. She is taking Zofran/ compazine prn hoping that helps. Denies acid reflux at this time.  Any suggestions? [name] appreciated the call.

## 2022-03-23 NOTE — Telephone Encounter (Signed)
Called and given below message. She verbalized understanding and appreciated the call. She will call the office back for questions/ concerns.

## 2022-03-25 ENCOUNTER — Other Ambulatory Visit: Payer: Self-pay

## 2022-03-25 ENCOUNTER — Inpatient Hospital Stay: Payer: 59

## 2022-03-25 DIAGNOSIS — C569 Malignant neoplasm of unspecified ovary: Secondary | ICD-10-CM

## 2022-03-25 DIAGNOSIS — Z5111 Encounter for antineoplastic chemotherapy: Secondary | ICD-10-CM | POA: Diagnosis not present

## 2022-03-25 LAB — CBC WITH DIFFERENTIAL/PLATELET
Abs Immature Granulocytes: 0.02 10*3/uL (ref 0.00–0.07)
Basophils Absolute: 0 10*3/uL (ref 0.0–0.1)
Basophils Relative: 0 %
Eosinophils Absolute: 0 10*3/uL (ref 0.0–0.5)
Eosinophils Relative: 0 %
HCT: 31.5 % — ABNORMAL LOW (ref 36.0–46.0)
Hemoglobin: 10.3 g/dL — ABNORMAL LOW (ref 12.0–15.0)
Immature Granulocytes: 1 %
Lymphocytes Relative: 30 %
Lymphs Abs: 1 10*3/uL (ref 0.7–4.0)
MCH: 29.1 pg (ref 26.0–34.0)
MCHC: 32.7 g/dL (ref 30.0–36.0)
MCV: 89 fL (ref 80.0–100.0)
Monocytes Absolute: 0.1 10*3/uL (ref 0.1–1.0)
Monocytes Relative: 2 %
Neutro Abs: 2.4 10*3/uL (ref 1.7–7.7)
Neutrophils Relative %: 67 %
Platelets: 132 10*3/uL — ABNORMAL LOW (ref 150–400)
RBC: 3.54 MIL/uL — ABNORMAL LOW (ref 3.87–5.11)
RDW: 17.5 % — ABNORMAL HIGH (ref 11.5–15.5)
Smear Review: NORMAL
WBC: 3.5 10*3/uL — ABNORMAL LOW (ref 4.0–10.5)
nRBC: 0 % (ref 0.0–0.2)

## 2022-03-25 LAB — SAMPLE TO BLOOD BANK

## 2022-03-25 NOTE — Progress Notes (Signed)
Given a copy of CBC result with HGB 10.3. Blood transfusion canceled for today. She is feeling good and does not feel that she needs IV fluids. Changed lab appt on 2/12 to port lab flush per her request.

## 2022-03-28 ENCOUNTER — Inpatient Hospital Stay: Payer: 59

## 2022-03-28 ENCOUNTER — Encounter: Payer: Self-pay | Admitting: Hematology and Oncology

## 2022-03-28 ENCOUNTER — Other Ambulatory Visit (HOSPITAL_COMMUNITY): Payer: Self-pay

## 2022-03-28 ENCOUNTER — Telehealth: Payer: Self-pay | Admitting: Oncology

## 2022-03-28 ENCOUNTER — Other Ambulatory Visit: Payer: Self-pay | Admitting: Hematology and Oncology

## 2022-03-28 ENCOUNTER — Inpatient Hospital Stay (HOSPITAL_BASED_OUTPATIENT_CLINIC_OR_DEPARTMENT_OTHER): Payer: 59 | Admitting: Hematology and Oncology

## 2022-03-28 ENCOUNTER — Other Ambulatory Visit: Payer: Self-pay

## 2022-03-28 VITALS — BP 121/82 | HR 117 | Temp 98.5°F | Resp 18 | Ht 59.0 in | Wt 98.2 lb

## 2022-03-28 DIAGNOSIS — D61818 Other pancytopenia: Secondary | ICD-10-CM

## 2022-03-28 DIAGNOSIS — E860 Dehydration: Secondary | ICD-10-CM

## 2022-03-28 DIAGNOSIS — C569 Malignant neoplasm of unspecified ovary: Secondary | ICD-10-CM

## 2022-03-28 DIAGNOSIS — M792 Neuralgia and neuritis, unspecified: Secondary | ICD-10-CM

## 2022-03-28 DIAGNOSIS — Z5111 Encounter for antineoplastic chemotherapy: Secondary | ICD-10-CM | POA: Diagnosis not present

## 2022-03-28 LAB — CBC WITH DIFFERENTIAL/PLATELET
Abs Immature Granulocytes: 0 10*3/uL (ref 0.00–0.07)
Basophils Absolute: 0 10*3/uL (ref 0.0–0.1)
Basophils Relative: 1 %
Eosinophils Absolute: 0 10*3/uL (ref 0.0–0.5)
Eosinophils Relative: 1 %
HCT: 31.4 % — ABNORMAL LOW (ref 36.0–46.0)
Hemoglobin: 10.5 g/dL — ABNORMAL LOW (ref 12.0–15.0)
Immature Granulocytes: 0 %
Lymphocytes Relative: 58 %
Lymphs Abs: 1.2 10*3/uL (ref 0.7–4.0)
MCH: 29.7 pg (ref 26.0–34.0)
MCHC: 33.4 g/dL (ref 30.0–36.0)
MCV: 89 fL (ref 80.0–100.0)
Monocytes Absolute: 0.1 10*3/uL (ref 0.1–1.0)
Monocytes Relative: 6 %
Neutro Abs: 0.7 10*3/uL — ABNORMAL LOW (ref 1.7–7.7)
Neutrophils Relative %: 34 %
Platelets: 226 10*3/uL (ref 150–400)
RBC: 3.53 MIL/uL — ABNORMAL LOW (ref 3.87–5.11)
RDW: 18.3 % — ABNORMAL HIGH (ref 11.5–15.5)
WBC: 2 10*3/uL — ABNORMAL LOW (ref 4.0–10.5)
nRBC: 0 % (ref 0.0–0.2)

## 2022-03-28 LAB — SAMPLE TO BLOOD BANK

## 2022-03-28 MED ORDER — PREGABALIN 25 MG PO CAPS
25.0000 mg | ORAL_CAPSULE | Freq: Every day | ORAL | 1 refills | Status: DC
  Filled 2022-03-28: qty 90, 90d supply, fill #0
  Filled 2022-04-07: qty 30, 30d supply, fill #0
  Filled 2022-05-11: qty 30, 30d supply, fill #1

## 2022-03-28 MED ORDER — SODIUM CHLORIDE 0.9% FLUSH
10.0000 mL | Freq: Once | INTRAVENOUS | Status: AC
  Administered 2022-03-28: 10 mL

## 2022-03-28 NOTE — Assessment & Plan Note (Signed)
This is due to recent treatment She is not symptomatic She does not need transfusion support

## 2022-03-28 NOTE — Assessment & Plan Note (Signed)
Overall, she tolerated recent treatment well except for dehydration and fluid retention She is back to her baseline today She will return on Friday to get her treatment labs checked She does not need transfusion support I will tentatively schedule cycle 3 of treatment for next month She will get imaging study done in April

## 2022-03-28 NOTE — Telephone Encounter (Signed)
[name] with Genetics appointment on 04/14/22 at 8:00.  She verbalized understanding and agreement.

## 2022-03-28 NOTE — Progress Notes (Signed)
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[place]) Overall, she tolerated recent treatment well except for dehydration and fluid retention She is back to her baseline today She will return on Friday to get her treatment labs checked She does not need transfusion support I will tentatively schedule cycle 3 of treatment for next month She will get imaging study done in April  Pancytopenia, acquired [facility]) This is due to recent treatment She is not symptomatic She does not need transfusion support  Dehydration She is prone to get dehydrated Her tachycardia could be related to this She will continue oral fluid hydration as tolerated  No orders of the defined types were placed in this encounter.   All questions were answered. The patient knows to call the clinic with any problems, questions or concerns. The total time spent in the appointment was 20 minutes encounter with patients including review of chart and various tests results, discussions about plan of care and coordination of care plan   Heath Lark, MD 03/28/2022 2:34 PM  INTERVAL HISTORY: Please see below for problem oriented charting. she returns for treatment follow-up with her father She tolerated cycle 1 of therapy well except for dehydration that resolved with fluids and fluid retention She felt that she is eating better She has some reflux intermittently  REVIEW OF SYSTEMS:   Constitutional: Denies fevers, chills or abnormal weight loss Eyes: Denies blurriness of vision Ears, nose, mouth, throat, and face: Denies mucositis or sore throat Respiratory: Denies cough, dyspnea or  wheezes Cardiovascular: Denies palpitation, chest discomfort or lower extremity swelling Skin: Denies abnormal skin rashes Lymphatics: Denies new lymphadenopathy or easy bruising Neurological:Denies numbness, tingling or new weaknesses Behavioral/Psych: Mood is stable, no new changes  All other systems were reviewed with the patient and are negative.  I have reviewed the past medical history, past surgical history, social history and family history with the patient and they are unchanged from previous note.  ALLERGIES:  is allergic to latex, sulfa antibiotics, sulfamethoxazole-trimethoprim, and septra [bactrim].  MEDICATIONS:  Current Outpatient Medications  Medication Sig Dispense Refill   acetaminophen (TYLENOL) 650 MG CR tablet Take 650 mg by mouth every 8 (eight) hours as needed for pain.     apixaban (ELIQUIS) 2.5 MG TABS tablet Take 1 tablet (2.5 mg total) by mouth 2 (two) times daily. 60 tablet 0   cholecalciferol (VITAMIN D3) 25 MCG (1000 UNIT) tablet Take 2,000 Units by mouth daily.     dexamethasone (DECADRON) 4 MG tablet Take 2 tablets (8 mg total) by mouth daily. Start the day after last cisplatin dose on day 6 of chemo x 3 days.Take with food. 30 tablet 1   famotidine (PEPCID) 10 MG tablet Take 10 mg by mouth daily as needed for heartburn or indigestion.     furosemide (LASIX) 20 MG tablet Take 1 tablet (20 mg total) by mouth daily. 30 tablet 1   LORazepam (ATIVAN) 0.5 MG tablet Take 1 tablet (0.5 mg total) by mouth every 8 (eight) hours. (Patient taking differently: Take 0.5 mg by mouth every 8 (eight) hours as needed for anxiety.) 30 tablet 0   ondansetron (ZOFRAN) 8 MG tablet Take 1 tablet (8 mg total) by  mouth every 8 (eight) hours as needed for nausea or vomiting. Start on the third day after last cisplatin dose on day 8 of chemo 30 tablet 1   pregabalin (LYRICA) 25 MG capsule Take 1 capsule (25 mg total) by mouth at bedtime. 90 capsule 1   prochlorperazine (COMPAZINE) 10 MG  tablet Take 1 tablet (10 mg total) by mouth every 6 (six) hours as needed for nausea or vomiting. 30 tablet 1   traMADol (ULTRAM) 50 MG tablet Take 1 tablet (50 mg total) by mouth every 6 (six) hours as needed for severe pain. For AFTER surgery, do not take and drive 30 tablet 0   No current facility-administered medications for this visit.    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 21.1 by 10.9 by 18.7 cm abdominopelvic mass, with a satellite mass in the right upper quadrant anteriorly, and moderate ascites with tumor deposits along the pelvic ascites posteriorly. The mass could be arising from right or left ovary, or both. The mass displaces the bowel and IVC, but there is no IVC thrombus or pelvic DVT identified. 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 2. Additional satellite mass above the liver is partially included, along with tumor deposits along the ascites. 3. Low-level edema along the lateral abdominal wall musculature. 4. Flattening of the IVC at the level of the aortic bifurcation due to the large abdominopelvic tumor. No findings of pelvic DVT.     12/07/2021 Imaging   CT abdomen and pelvis 1. Large central abdominal and pelvic mass consistent with known malignancy. This is incompletely visualized by this CT of the abdomen, but was seen on recent pelvic MRI. 2. 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 Right omental implant consistent with metastatic disease. Small amount of abdominal ascites. 3. No evidence for bowel or ureteral obstruction. 4. Aortic Atherosclerosis (ICD10-I70.0). 5. Chest findings dictated separately.   12/07/2021 Imaging   CT chest 1.  Negative for pulmonary embolism. And no acute or metastatic process identified in the Chest. 2. Partially visible liver mass and ascites, staging CT Abdomen today is reported separately.     12/07/2021 Procedure   Successful placement of a right internal jugular approach power injectable Port-A-Cath. The catheter is ready for immediate use.   12/07/2021 Procedure   Ultrasound-guided biopsy of right upper quadrant mass.    12/08/2021 Initial Diagnosis   Ovarian cancer ([place])   12/08/2021 Cancer Staging   Staging form: Ovary, Fallopian Tube, and Primary Peritoneal Carcinoma, AJCC 8th Edition - Clinical stage from 12/08/2021: FIGO Stage IIIC (cT3c, cN0, cM0) - Signed by Heath Lark, MD on 12/10/2021 Stage prefix: Initial diagnosis   12/20/2021 - 12/28/2021 Chemotherapy   Patient is on Treatment Plan : Ovarian germ cell tumor q21d x 3 Cycles     01/03/2022 Imaging   IMPRESSION: 1. Large central abdominopelvic mass compatible with malignancy, 3450 cubic cm in volume. This may be arising from the ovaries with slight eccentricity to the right in the pelvis. 2. Mild increase in size of the right omental tumor deposit. 3. Mild increase in size of the subcapsular mass along the dome of the right hepatic lobe, with suspected hepatic invasion. 4. Scattered malignant ascites.  5. No dilated bowel to suggest obstruction. 6. 2 mm left kidney lower pole nonobstructive renal calculus. 7. Mild abdominal aortic atherosclerotic vascular disease.   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  Postoperative Diagnosis: Status post R0 cytoreductive surgery and heating intraperitoneal chemotherapy  Procedures: LAPAROSCOPY, ABDOMEN, PERITONEUM, & OMENTUM, DIAGNOSTIC, W/WO COLLECTION SPECIMEN(S) BY BRUSHING OR WASHING  EXCISION/DESTRUCTION, OPEN, INTRA-ABD TUMOR/CYST/ENDOMETRIOMA, 1+ PERITONEAL/MESENTERI/RETROPERIT 5CM OR  <COLECTOMY, PARTIAL; WITH ANASTOMOSIS COLOSTOMY OR SKIN LEVEL CECOSTOMY SPLENECTOMY; TOT (SEPART PROC) EXPLORATORY LAPAROTOMY, EXPLORATORY CELIOTOMY WITH OR WITHOUT BIOPSY(S) HYPERTHERMIA, EXTERNALLY GENERATED; DEEP CHEMOTHERAPY ADMINISTRATION INTO THE PERITONEAL CAVITY VIA INDWELLING PORT OR CATHETER RESECTION (INITIAL) OVARIAN, TUBAL/PRIM PERITONEAL MALIG W/BIL S&O/OMENTECT; W/RAD DISSECTION FOR DEBULKING CHOLECYSTECTOMY APPENDECTOMY OMENTECTOMY, EPIPLOECTOMY, RESECTION OF OMENTUM Procedures: Exploratory laparotomy, total abdominal hysterectomy with radical dissection, bilateral ureterolysis, bilateral salpingo-oophorectomy, posterior cul-de-sac peritonectomy, tumor debulking by Dr Berline Lopes with Gynecology Oncology Dr [place] Scrape with surgical oncology: Diagnostic laparoscopy, exploratory laparotomy, low anterior resection with primary end-to-end anastomosis, enterolysis, cholecystectomy, tumor capsule tumor debulking, splenectomy, appendectomy, omentectomy, diverting loop ileostomy formation  Surgeon: Jeral Pinch, MD  Findings: On diagnostic laparoscopy, only able to clearly visualize the left upper quadrant. Adhesions of bowel in the right upper quadrant limiting survey, minimal ascites noted in the pelvis, scattered tumor plaques visualized on left diaphragm. Given these findings, the decision was made to convert to exploratory laparotomy. Multiple loops of small bowel encased in a Cancn like structure in the midline with multiple filmy adhesions tethering to the superior aspect of the a large pelvic mass. Multicystic and necrotic pelvic mass originating from left ovary, approximately 18 x 20 cm incorporated within the left broad ligament with the left tube draped overlying the left ovarian mass. Bilateral tubes normal in appearance, normal-appearing uterus with smooth serosa. Right adnexa similarly with approximate 10 x 8 multicystic and necrotic mass. Diffuse tumor plaques encompassing posterior  cul-de-sac peritoneum. Rectosigmoid densely adhered to posterior aspect of left adnexal mass requiring dissection and ultimately resection. Scattered treated subcentimeter nodules along small bowel mesentery. 5 x 5 cm necrotic and multicystic tumor at the level of the hepatic flexure. Enlarged similar multicystic necrotic tumor adherent between the right diaphragm and the liver without any evidence of liver invasion and tumor encased solely within the liver capsule. Multiple tumor nodules along the spleen requiring splenectomy. Omentum overall fairly normal in appearance with only scattered small tumor deposits, no omental cake was encountered. Please see surgical oncology operative note for further details regarding operative findings for their portion of the procedure.  Specimens: ID Type Source Tests Collected by Time Destination 1 : Abdominal fat pad Tissue Abdomen SURGICAL PATHOLOGY EXAM Marthann Schiller, MD 02/15/2022 [phone] 2 : Left adnexa Tissue Abdomen SURGICAL PATHOLOGY EXAM Marthann Schiller, MD 02/15/2022 [phone] 3 : right adnexa Tissue Abdomen SURGICAL PATHOLOGY EXAM Marthann Schiller, MD 02/15/2022 [phone] : Left perimetrium Tissue Uterus SURGICAL PATHOLOGY EXAM Lafonda Mosses, MD 02/15/2022 [phone] : Uterus and cervix Tissue Uterus SURGICAL PATHOLOGY EXAM Marthann Schiller, MD 02/15/2022 1018 6 : Low Anterior Resection Tissue Abdomen SURGICAL PATHOLOGY EXAM Marthann Schiller, MD 02/15/2022 1055 7 : Right peritoneum Tissue Abdomen SURGICAL PATHOLOGY EXAM Marthann Schiller, MD 02/15/2022 1058 8 : Right retroperitoneal mass Tissue Abdomen SURGICAL PATHOLOGY EXAM Marthann Schiller, MD 02/15/2022 1101 9 : Omentum Tissue Omentum SURGICAL PATHOLOGY EXAM Marthann Schiller, MD 02/15/2022 1130 10 : Gallbladder Tissue Gallbladder SURGICAL PATHOLOGY EXAM  Marthann Schiller, MD 02/15/2022 1143 11 : Right diaphragm Tissue Diaphragm SURGICAL PATHOLOGY EXAM  Marthann Schiller, MD 02/15/2022 1143 12 : Left diaphragm nodule Tissue Diaphragm SURGICAL PATHOLOGY EXAM Marthann Schiller, MD 02/15/2022 1157 13 : Spleen Tissue Spleen SURGICAL PATHOLOGY EXAM Marthann Schiller, MD 02/15/2022 1204 14 : Appendix Tissue Appendix SURGICAL PATHOLOGY EXAM Marthann Schiller, MD 02/15/2022 1213 15 : Small bowel mesenteric nodules Tissue Small Bowel SURGICAL PATHOLOGY EXAM Marthann Schiller, MD 02/15/2022 1215 16 : Colon mesenteric nodule Tissue Colon SURGICAL PATHOLOGY EXAM Marthann Schiller, MD 02/15/2022 1221 17 : Hepatic flexure mass Tissue Liver SURGICAL PATHOLOGY EXAM Marthann Schiller, MD [id number]  Complications: None  Indications for Procedure: [name] is a 52 y.o. 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 Prior to the procedure, all risks, benefits, and alternatives were discussed and informed surgical consent was signed.  Procedure: Patient was taken to the operating room where general anesthesia was achieved. She was positioned in dorsal lithotomy and prepped and draped per the surgical oncology team. A foley catheter was inserted into the bladder. A 5 mm skin incision was made in the left upper quadrant at Palmer's point, the abdomen was then entered with direct Optiview entry and the abdomen was insufflated. Findings were noted as above.  The laparoscope was then removed from the abdomen, the pneumoperitoneum was maintained. A vertical midline incision was made with the scalpel and the abdomen was entered sharply. The abdomen and pelvis were surveyed with findings as documented above. A wound protector was placed followed by the [facility] retractor.  The enlarged left adnexal mass was then carefully removed of its bilateral sidewall attachments using a mixture of blunt and cautery dissection. Multiple small bowel loops were  adhered to the superior and posterior aspect of the mass with filmy adhesions which were carefully bluntly resected. The rectosigmoid was noted to be densely adhered to the posterior aspect of the pelvic mass. Dissection was attempted using mixture of blunt dissection as well as sharp dissection with the Metzenbaum scissors, and unavoidable enterotomy of approximately 4 cm was made to the anterior surface of the rectosigmoid. This enterotomy was then closed with 4-0 PDS in a running fashion to minimize contamination. Further dissection removing the remainder of the rectosigmoid from the posterior aspect of the pelvic mass was then undertaken. The pelvic mass was then able to be fully elevated from the pelvis. The left infundibulopelvic ligament was able to be isolated with the left ureter clearly visualized below. The left ureter was further dissected away from the medial leaf of the broad ligament, and a vessel loop was placed. The left infundibulopelvic ligament was then isolated, clamped, and transected using the LigaSure device as well as and suture-ligated. The left utero-ovarian ligament was then isolated, clamped, cauterized and transected. The left pelvic mass was then removed entirely.  Attention was then turned to the right. An additional enlarged multicystic right adnexal mass was noted, this was carefully elevated from the pelvis using blunt dissection. The right round ligament was then grasped, elevated, and transected. The broad ligament was then opened posteriorly. The ureter was identified and the right infundibulopelvic ligament was then isolated, clamped, transected, and tied. The right utero ovarian ligament was then isolated, clamped, and transected using the LigaSure device and the right adnexal mass was removed.  Attention then was turned toward the hysterectomy. A sponge stick  was placed within the vagina. The right broad ligament dissection was opened anteriorly and the bladder flap was  developed. The left round ligament was then further transected, and the broad ligament opened posteriorly and anteriorly to complete the bladder flap. The right uterine artery was skeletonized, clamped, transected, and suture-ligated. There was significant parametrial tumor involvement on the left side, and therefore a radical dissection was required to isolate the blood supply. The left ureter was further dissected down to the level near the trigone. The left uterine artery was traced to its origin, isolated, and clamped with vascular clips. Similarly the left uterine vein due to significant necrotic tumor involvement was friable. Detachment was similarly carefully isolated, and clipped with vascular clips. Sequential clamps were then used to transect the remainder of the broad and cardinal ligaments bilaterally, with each pedicle being suture-ligated. A large right angle clamp was then able to be placed below the cervix, and the uterus and cervix were amputated from the vagina. The vaginal cuff was then closed with several figure-of-eight sutures using 0 Vicryl.    02/15/2022 Pathology Results   A: Abdominal fat pad, excision - Fibroadipose tissue with residual mixed malignant germ cell tumor with treatment effect   B: Left adnexa, salpingo-oophorectomy - Residual mixed malignant germ cell tumor with treatment effect, [id number] (See Comment and Synoptic report) - FIGO stage IIIC - Fallopian tubes with no tumor present   C: Right adnexa, salpingo-oophorectomy - Residual mixed malignant germ cell tumor with treatment effect (See Synoptic report) - Fallopian tubes not identified   D: Left parametrium, biopsy - Fibroadipose tissue with residual mixed malignant germ cell tumor with treatment effect   E: Uterus and cervix, hysterectomy - Right adnexal soft tissue with residual mixed malignant germ cell tumor with treatment effect   Myometrium: - Adenomyosis - Leiomyomata measuring up to 0.7 cm -  Extensive chronic serositis   Cervix:  - Ectocervix and endocervix with chronic cervicitis    Endometrium: - Inactive endometrium   F: Low anterior resection - Residual mixed malignant  germ cell tumor with treatment effect involving colonic serosa and pericolonic adipose tissue - Proximal and distal colonic resection margins with chronic serositis, but negative for tumor    G: Right peritoneum, biopsy - Fibroadipose tissue with residual mixed malignant germ cell tumor with treatment effect   H: Right retroperitoneal mass, excision - Fibroadipose tissue with residual mixed malignant germ cell tumor with treatment effect   I: Omentum, omentectomy - Fibroadipose tissue with residual mixed malignant germ cell tumor with treatment effect   J: Gallbladder, cholecystectomy - Gallbladder with mild chronic serositis and fibrous adhesion   K: Right diaphragm, excision - Fibrous tissue with residual mixed malignant germ cell tumor with treatment effect - Hepatic tissue with no tumor present   L: Left diaphragm nodule, biopsy - Fibrous tissue with residual mixed malignant germ cell tumor with treatment effect   M: Spleen, splenectomy - Residual mixed malignant germ cell tumor with treatment effect involving splenic capsule   N: Appendix, appendectomy - Appendix with fibrous obliteration at the tip - Periappendiceal adipose tissue with acute and chronic inflammation   O: Small bowel mesenteric nodules, excision - Fibroadipose tissue with residual mixed malignant germ cell tumor with treatment effect   P: Mesenteric nodules, excision - Fibroadipose tissue with residual mixed malignant germ cell tumor with treatment effect   Q: Hepatic flexure mass, excision - Fibroadipose tissue with residual mixed malignant germ cell tumor with treatment effect   R:  Anvil and donut, biopsy - Benign segment of colon with acute and chronic inflammation and fibrous adhesion  SPECIMEN    Procedure:     Radical hysterectomy     Procedure:    Omentectomy     Procedure:    Peritoneal tumor debulking     Hysterectomy Type:    Abdominal     Specimen Integrity:          Right Ovary Integrity:    Capsule intact     Specimen Integrity:          Left Ovary Integrity:    Capsule intact   TUMOR    Tumor Site:    Bilateral ovaries     Tumor Size:    Greatest Dimension (Centimeters): 19.5 cm     Histologic Type:    Mixed malignant germ cell tumor:  yolk sac tumor, mature teratoma, mature neural elements, immature neural tissue, and immature cartilage     Ovarian Surface Involvement:    Present, right and left     Other Tissue / Organ Involvement:    Right ovary     Other Tissue / Organ Involvement:    Left ovary     Other Tissue / Organ Involvement:    Right fallopian tube     Other Tissue / Organ Involvement:    Omentum     Other Tissue / Organ Involvement:    Diaphragm and splenic capsule     Largest Extrapelvic Peritoneal Focus:    Macroscopic (greater than 2 cm): Splenic capsule     Peritoneal / Ascitic Fluid Involvement:    Not submitted / unknown   REGIONAL LYMPH NODES     Regional Lymph Node Status:    Not applicable (no regional lymph nodes submitted or found)   DISTANT METASTASIS     Distant Site(s) Involved:    Diaphragm, splenic capsule   pTNM CLASSIFICATION (AJCC 8th Edition)     Reporting of pT, pN, and (when applicable) pM categories is based on information available to the pathologist at the time the report is issued. As per the AJCC (Chapter 1, 8th Ed.) it is the managing physician's responsibility to establish the final pathologic stage based upon all pertinent information, including but potentially not limited to this pathology report.     Modified Classification:    y     pT Category:    pT3c     pN Category:    pN not assigned (no nodes submitted or found)     pM Category:    pM1   FIGO STAGE     FIGO Stage:    IIIC   COMMENT:  The needle core biopsies show a  heterogeneous combination of elements including abundant neural tissue with atypical and immature features. There are also atypical glandular epithelial structures and focal yolk sac elements with microcystic pattern.  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  Immunohistochemistry is performed for cytokeratin AE1/AE3, CD117, CD30, G FAP, glypican-3, OCT3/4, synaptophysin, S100 and CD30.     03/14/2022 -  Chemotherapy   Patient is on Treatment Plan : Malignant germ cell tumor of ovary D1-5 q21d     03/22/2022 Tumor Marker   Patient's tumor was tested for the following markers: AFP. Results of the tumor marker test revealed 7.9.     PHYSICAL EXAMINATION: ECOG PERFORMANCE STATUS: 1 - Symptomatic but completely ambulatory  Vitals:   03/28/22 1329  BP: 121/82  Pulse: (!) 117  Resp: 18  Temp: 98.5 F (36.9 C)  SpO2: 100%   Filed Weights   03/28/22 1329  Weight: 98 lb 3.2 oz (44.5 kg)    GENERAL:alert, no distress and comfortable NEURO: alert & oriented x 3 with fluent speech, no focal motor/sensory deficits  LABORATORY DATA:  I have reviewed the data as listed    Component Value Date/Time   NA 137 03/21/2022 0835   K 4.6 03/21/2022 0835   CL 99 03/21/2022 0835   CO2 28 03/21/2022 0835   GLUCOSE 86 03/21/2022 0835   BUN 23 (H) 03/21/2022 0835   CREATININE 0.65 03/21/2022 0835   CREATININE 0.75 03/23/2011 1550   CALCIUM 9.9 03/21/2022 0835   PROT 6.7 03/21/2022 0835   ALBUMIN 4.0 03/21/2022 0835   AST 16 03/21/2022 0835   ALT 38 03/21/2022 0835   ALKPHOS 69 03/21/2022 0835   BILITOT 0.5 03/21/2022 0835   GFRNONAA >60 03/21/2022 0835    No results found for: "SPEP", "UPEP"  Lab Results  Component Value Date   WBC 2.0 (L) 03/28/2022   NEUTROABS 0.7 (L) 03/28/2022   HGB 10.5 (L) 03/28/2022   HCT 31.4 (L) 03/28/2022   MCV 89.0 03/28/2022   PLT 226 03/28/2022      Chemistry       Component Value Date/Time   NA 137 03/21/2022 0835   K 4.6 03/21/2022 0835   CL 99 03/21/2022 0835   CO2 28 03/21/2022 0835   BUN 23 (H) 03/21/2022 0835   CREATININE 0.65 03/21/2022 0835   CREATININE 0.75 03/23/2011 1550      Component Value Date/Time   CALCIUM 9.9 03/21/2022 0835   ALKPHOS 69 03/21/2022 0835   AST 16 03/21/2022 0835   ALT 38 03/21/2022 0835   BILITOT 0.5 03/21/2022 0835

## 2022-03-28 NOTE — Assessment & Plan Note (Signed)
She is prone to get dehydrated Her tachycardia could be related to this She will continue oral fluid hydration as tolerated

## 2022-03-31 ENCOUNTER — Other Ambulatory Visit (HOSPITAL_COMMUNITY): Payer: Self-pay

## 2022-04-01 ENCOUNTER — Inpatient Hospital Stay (HOSPITAL_BASED_OUTPATIENT_CLINIC_OR_DEPARTMENT_OTHER): Payer: 59 | Admitting: Hematology and Oncology

## 2022-04-01 ENCOUNTER — Inpatient Hospital Stay: Payer: 59

## 2022-04-01 ENCOUNTER — Telehealth: Payer: Self-pay

## 2022-04-01 ENCOUNTER — Other Ambulatory Visit (HOSPITAL_COMMUNITY): Payer: Self-pay

## 2022-04-01 ENCOUNTER — Other Ambulatory Visit: Payer: Self-pay

## 2022-04-01 ENCOUNTER — Encounter: Payer: Self-pay | Admitting: Hematology and Oncology

## 2022-04-01 VITALS — BP 106/81 | HR 108 | Temp 98.9°F | Resp 18 | Ht 59.0 in | Wt 99.4 lb

## 2022-04-01 DIAGNOSIS — G8918 Other acute postprocedural pain: Secondary | ICD-10-CM

## 2022-04-01 DIAGNOSIS — Z5111 Encounter for antineoplastic chemotherapy: Secondary | ICD-10-CM | POA: Diagnosis not present

## 2022-04-01 DIAGNOSIS — K1231 Oral mucositis (ulcerative) due to antineoplastic therapy: Secondary | ICD-10-CM

## 2022-04-01 DIAGNOSIS — C569 Malignant neoplasm of unspecified ovary: Secondary | ICD-10-CM | POA: Diagnosis not present

## 2022-04-01 DIAGNOSIS — D61818 Other pancytopenia: Secondary | ICD-10-CM

## 2022-04-01 LAB — CMP (CANCER CENTER ONLY)
ALT: 21 U/L (ref 0–44)
AST: 13 U/L — ABNORMAL LOW (ref 15–41)
Albumin: 4.2 g/dL (ref 3.5–5.0)
Alkaline Phosphatase: 69 U/L (ref 38–126)
Anion gap: 5 (ref 5–15)
BUN: 11 mg/dL (ref 6–20)
CO2: 30 mmol/L (ref 22–32)
Calcium: 9.6 mg/dL (ref 8.9–10.3)
Chloride: 104 mmol/L (ref 98–111)
Creatinine: 0.53 mg/dL (ref 0.44–1.00)
GFR, Estimated: >60 mL/min (ref >60)
Glucose, Bld: 98 mg/dL (ref 70–99)
Potassium: 4.3 mmol/L (ref 3.5–5.1)
Sodium: 139 mmol/L (ref 135–145)
Total Bilirubin: 0.3 mg/dL (ref 0.3–1.2)
Total Protein: 6.8 g/dL (ref 6.5–8.1)

## 2022-04-01 LAB — CBC WITH DIFFERENTIAL (CANCER CENTER ONLY)
Abs Immature Granulocytes: 0.02 10*3/uL (ref 0.00–0.07)
Basophils Absolute: 0 10*3/uL (ref 0.0–0.1)
Basophils Relative: 1 %
Eosinophils Absolute: 0 10*3/uL (ref 0.0–0.5)
Eosinophils Relative: 2 %
HCT: 34 % — ABNORMAL LOW (ref 36.0–46.0)
Hemoglobin: 11 g/dL — ABNORMAL LOW (ref 12.0–15.0)
Immature Granulocytes: 1 %
Lymphocytes Relative: 64 %
Lymphs Abs: 1.3 10*3/uL (ref 0.7–4.0)
MCH: 29.8 pg (ref 26.0–34.0)
MCHC: 32.4 g/dL (ref 30.0–36.0)
MCV: 92.1 fL (ref 80.0–100.0)
Monocytes Absolute: 0.5 10*3/uL (ref 0.1–1.0)
Monocytes Relative: 27 %
Neutro Abs: 0.1 10*3/uL — CL (ref 1.7–7.7)
Neutrophils Relative %: 5 %
Platelet Count: 260 10*3/uL (ref 150–400)
RBC: 3.69 MIL/uL — ABNORMAL LOW (ref 3.87–5.11)
RDW: 19.1 % — ABNORMAL HIGH (ref 11.5–15.5)
Smear Review: NORMAL
WBC Count: 2 10*3/uL — ABNORMAL LOW (ref 4.0–10.5)
nRBC: 0 % (ref 0.0–0.2)

## 2022-04-01 LAB — SAMPLE TO BLOOD BANK

## 2022-04-01 LAB — MAGNESIUM: Magnesium: 1.9 mg/dL (ref 1.7–2.4)

## 2022-04-01 MED ORDER — NYSTATIN 100000 UNIT/ML MT SUSP
5.0000 mL | Freq: Four times a day (QID) | OROMUCOSAL | 0 refills | Status: DC
  Filled 2022-04-01: qty 160, 8d supply, fill #0

## 2022-04-01 MED ORDER — TRAMADOL HCL 50 MG PO TABS
50.0000 mg | ORAL_TABLET | Freq: Four times a day (QID) | ORAL | 0 refills | Status: DC | PRN
  Filled 2022-04-01: qty 30, 8d supply, fill #0

## 2022-04-01 MED FILL — Dexamethasone Sodium Phosphate Inj 100 MG/10ML: INTRAMUSCULAR | Qty: 1 | Status: AC

## 2022-04-01 MED FILL — Fosaprepitant Dimeglumine For IV Infusion 150 MG (Base Eq): INTRAVENOUS | Qty: 5 | Status: AC

## 2022-04-01 NOTE — Telephone Encounter (Signed)
CRITICAL VALUE STICKER  CRITICAL VALUE: ANC 0.1  RECEIVER (on-site recipient of call): Danika Kluender P. LPN  DATE & TIME NOTIFIED: 04/01/22 12:43 pm   MESSENGER (representative from lab): Lelan Pons  MD NOTIFIED Nurse Brenda/Dr. Alvy Bimler

## 2022-04-01 NOTE — Progress Notes (Signed)
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[place]) Overall, she tolerated recent treatment well except for dehydration and fluid retention She is back to her baseline today except for mucositis We discussed conservative approach for mucositis We will proceed with treatment without delay but I recommend addition of G-CSF support with next cycle  She will get imaging study done in April  Pancytopenia, acquired [facility]) She is symptomatic from mucositis Recommend Magic mouthwash with lidocaine and G-CSF support for next visit I recommend against flossing She does not need transfusion support  Mucositis due to chemotherapy Due to recent chemotherapy We discussed the role of conservative approach I recommend addition of G-CSF for next cycle  No orders of the defined types were placed in this encounter.   All questions were answered. The patient knows to call the clinic with any problems, questions or concerns. The total time spent in the appointment was 30 minutes encounter with patients including review of chart and various tests results, discussions about plan of care and coordination of care plan   Heath Lark, MD 04/01/2022 3:06 PM  INTERVAL HISTORY: Please see below for problem oriented charting. she returns for evaluation due to tenderness in her gum when she tries to flush Denies fever or chills She is able to eat and drink without difficulties  REVIEW OF SYSTEMS:   Constitutional: Denies fevers, chills or abnormal weight loss Eyes: Denies blurriness of vision Respiratory: Denies cough, dyspnea or wheezes Cardiovascular: Denies palpitation, chest  discomfort or lower extremity swelling Gastrointestinal:  Denies nausea, heartburn or change in bowel habits Skin: Denies abnormal skin rashes Lymphatics: Denies new lymphadenopathy or easy bruising Neurological:Denies numbness, tingling or new weaknesses Behavioral/Psych: Mood is stable, no new changes  All other systems were reviewed with the patient and are negative.  I have reviewed the past medical history, past surgical history, social history and family history with the patient and they are unchanged from previous note.  ALLERGIES:  is allergic to latex, sulfa antibiotics, sulfamethoxazole-trimethoprim, and septra [bactrim].  MEDICATIONS:  Current Outpatient Medications  Medication Sig Dispense Refill   acetaminophen (TYLENOL) 650 MG CR tablet Take 650 mg by mouth every 8 (eight) hours as needed for pain.     apixaban (ELIQUIS) 2.5 MG TABS tablet Take 1 tablet (2.5 mg total) by mouth 2 (two) times daily. 60 tablet 0   cholecalciferol (VITAMIN D3) 25 MCG (1000 UNIT) tablet Take 2,000 Units by mouth daily.     dexamethasone (DECADRON) 4 MG tablet Take 2 tablets (8 mg total) by mouth daily. Start the day after last cisplatin dose on day 6 of chemo x 3 days.Take with food. 30 tablet 1   famotidine (PEPCID) 10 MG tablet Take 10 mg by mouth daily as needed for heartburn or indigestion.     furosemide (LASIX) 20 MG tablet Take 1 tablet (20 mg total) by mouth daily. 30 tablet 1   LORazepam (ATIVAN) 0.5 MG tablet Take 1 tablet (0.5 mg total) by mouth every 8 (eight) hours. (Patient taking differently: Take 0.5 mg by mouth every 8 (eight) hours as needed for anxiety.) 30 tablet 0   magic mouthwash (nystatin, lidocaine, diphenhydrAMINE, alum & mag hydroxide) suspension Swish and  spit 5 mLs by mouth 4 (four) times daily for 7 days 160 mL 0   ondansetron (ZOFRAN) 8 MG tablet Take 1 tablet (8 mg total) by mouth every 8 (eight) hours as needed for nausea or vomiting. Start on the third day after last  cisplatin dose on day 8 of chemo 30 tablet 1   pregabalin (LYRICA) 25 MG capsule Take 1 capsule (25 mg total) by mouth at bedtime. 90 capsule 1   prochlorperazine (COMPAZINE) 10 MG tablet Take 1 tablet (10 mg total) by mouth every 6 (six) hours as needed for nausea or vomiting. 30 tablet 1   traMADol (ULTRAM) 50 MG tablet Take 1 tablet (50 mg total) by mouth every 6 (six) hours as needed for severe pain. 30 tablet 0   No current facility-administered medications for this visit.    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 21.1 by 10.9 by 18.7 cm abdominopelvic mass, with a satellite mass in the right upper quadrant anteriorly, and moderate ascites with tumor deposits along the pelvic ascites posteriorly. The mass could be arising from right or left ovary, or both. The mass displaces the bowel and IVC, but there is no IVC thrombus or pelvic DVT identified. 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 2. Additional satellite mass above the liver is partially included, along with tumor deposits along the ascites. 3. Low-level edema along the lateral abdominal wall musculature. 4. Flattening of the IVC at the level of the aortic bifurcation due to the large abdominopelvic tumor. No findings of pelvic DVT.     12/07/2021 Imaging   CT abdomen and pelvis 1. Large central abdominal and pelvic mass consistent with known malignancy. This is incompletely visualized by this CT of the abdomen, but was seen on recent pelvic MRI. 2. 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 Right omental implant consistent with metastatic disease. Small amount of abdominal ascites. 3. No evidence for bowel or  ureteral obstruction. 4. Aortic Atherosclerosis (ICD10-I70.0). 5. Chest findings dictated separately.   12/07/2021 Imaging   CT chest 1. Negative for pulmonary embolism. And no acute or metastatic process identified in the Chest. 2. Partially visible liver mass and ascites, staging CT Abdomen today is reported separately.     12/07/2021 Procedure   Successful placement of a right internal jugular approach power injectable Port-A-Cath. The catheter is ready for immediate use.   12/07/2021 Procedure   Ultrasound-guided biopsy of right upper quadrant mass.    12/08/2021 Initial Diagnosis   Ovarian cancer ([place])   12/08/2021 Cancer Staging   Staging form: Ovary, Fallopian Tube, and Primary Peritoneal Carcinoma, AJCC 8th Edition - Clinical stage from 12/08/2021: FIGO Stage IIIC (cT3c, cN0, cM0) - Signed by Heath Lark, MD on 12/10/2021 Stage prefix: Initial diagnosis   12/20/2021 - 12/28/2021 Chemotherapy   Patient is on Treatment Plan : Ovarian germ cell tumor q21d x 3 Cycles     01/03/2022 Imaging   IMPRESSION: 1. Large central abdominopelvic mass compatible with malignancy, 3450 cubic cm in volume. This may be arising from the ovaries with slight eccentricity to the right in the pelvis. 2. Mild increase in size of the right omental tumor deposit. 3. Mild increase in  size of the subcapsular mass along the dome of the right hepatic lobe, with suspected hepatic invasion. 4. Scattered malignant ascites. 5. No dilated bowel to suggest obstruction. 6. 2 mm left kidney lower pole nonobstructive renal calculus. 7. Mild abdominal aortic atherosclerotic vascular disease.   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  Postoperative Diagnosis: Status post R0 cytoreductive surgery and heating intraperitoneal chemotherapy  Procedures: LAPAROSCOPY, ABDOMEN, PERITONEUM, & OMENTUM, DIAGNOSTIC, W/WO COLLECTION  SPECIMEN(S) BY BRUSHING OR WASHING  EXCISION/DESTRUCTION, OPEN, INTRA-ABD TUMOR/CYST/ENDOMETRIOMA, 1+ PERITONEAL/MESENTERI/RETROPERIT 5CM OR <COLECTOMY, PARTIAL; WITH ANASTOMOSIS COLOSTOMY OR SKIN LEVEL CECOSTOMY SPLENECTOMY; TOT (SEPART PROC) EXPLORATORY LAPAROTOMY, EXPLORATORY CELIOTOMY WITH OR WITHOUT BIOPSY(S) HYPERTHERMIA, EXTERNALLY GENERATED; DEEP CHEMOTHERAPY ADMINISTRATION INTO THE PERITONEAL CAVITY VIA INDWELLING PORT OR CATHETER RESECTION (INITIAL) OVARIAN, TUBAL/PRIM PERITONEAL MALIG W/BIL S&O/OMENTECT; W/RAD DISSECTION FOR DEBULKING CHOLECYSTECTOMY APPENDECTOMY OMENTECTOMY, EPIPLOECTOMY, RESECTION OF OMENTUM Procedures: Exploratory laparotomy, total abdominal hysterectomy with radical dissection, bilateral ureterolysis, bilateral salpingo-oophorectomy, posterior cul-de-sac peritonectomy, tumor debulking by Dr Berline Lopes with Gynecology Oncology Dr [place] Scrape with surgical oncology: Diagnostic laparoscopy, exploratory laparotomy, low anterior resection with primary end-to-end anastomosis, enterolysis, cholecystectomy, tumor capsule tumor debulking, splenectomy, appendectomy, omentectomy, diverting loop ileostomy formation  Surgeon: Jeral Pinch, MD  Findings: On diagnostic laparoscopy, only able to clearly visualize the left upper quadrant. Adhesions of bowel in the right upper quadrant limiting survey, minimal ascites noted in the pelvis, scattered tumor plaques visualized on left diaphragm. Given these findings, the decision was made to convert to exploratory laparotomy. Multiple loops of small bowel encased in a Cancn like structure in the midline with multiple filmy adhesions tethering to the superior aspect of the a large pelvic mass. Multicystic and necrotic pelvic mass originating from left ovary, approximately 18 x 20 cm incorporated within the left broad ligament with the left tube draped overlying the left ovarian mass. Bilateral tubes normal in appearance, normal-appearing uterus  with smooth serosa. Right adnexa similarly with approximate 10 x 8 multicystic and necrotic mass. Diffuse tumor plaques encompassing posterior cul-de-sac peritoneum. Rectosigmoid densely adhered to posterior aspect of left adnexal mass requiring dissection and ultimately resection. Scattered treated subcentimeter nodules along small bowel mesentery. 5 x 5 cm necrotic and multicystic tumor at the level of the hepatic flexure. Enlarged similar multicystic necrotic tumor adherent between the right diaphragm and the liver without any evidence of liver invasion and tumor encased solely within the liver capsule. Multiple tumor nodules along the spleen requiring splenectomy. Omentum overall fairly normal in appearance with only scattered small tumor deposits, no omental cake was encountered. Please see surgical oncology operative note for further details regarding operative findings for their portion of the procedure.  Specimens: ID Type Source Tests Collected by Time Destination 1 : Abdominal fat pad Tissue Abdomen SURGICAL PATHOLOGY EXAM Marthann Schiller, MD 02/15/2022 [phone] 2 : Left adnexa Tissue Abdomen SURGICAL PATHOLOGY EXAM Marthann Schiller, MD 02/15/2022 [phone] 3 : right adnexa Tissue Abdomen SURGICAL PATHOLOGY EXAM Marthann Schiller, MD 02/15/2022 [phone] 4 : Left perimetrium Tissue Uterus SURGICAL PATHOLOGY EXAM Lafonda Mosses, MD 02/15/2022 [phone] 5 : Uterus and cervix Tissue Uterus SURGICAL PATHOLOGY EXAM Marthann Schiller, MD 02/15/2022 1018 6 : Low Anterior Resection Tissue Abdomen SURGICAL PATHOLOGY EXAM Marthann Schiller, MD 02/15/2022 1055 7 : Right peritoneum Tissue Abdomen SURGICAL PATHOLOGY EXAM Marthann Schiller, MD 02/15/2022 1058 8 : Right retroperitoneal mass Tissue Abdomen SURGICAL PATHOLOGY EXAM Marthann Schiller, MD 02/15/2022 1101 9 :  Omentum Tissue Omentum SURGICAL PATHOLOGY EXAM Marthann Schiller, MD 02/15/2022 1130 10 : Gallbladder Tissue  Gallbladder SURGICAL PATHOLOGY EXAM Marthann Schiller, MD 02/15/2022 1143 11 : Right diaphragm Tissue Diaphragm SURGICAL PATHOLOGY EXAM Marthann Schiller, MD 02/15/2022 1143 12 : Left diaphragm nodule Tissue Diaphragm SURGICAL PATHOLOGY EXAM Marthann Schiller, MD 02/15/2022 1157 13 : Spleen Tissue Spleen SURGICAL PATHOLOGY EXAM Marthann Schiller, MD 02/15/2022 1204 14 : Appendix Tissue Appendix SURGICAL PATHOLOGY EXAM Marthann Schiller, MD 02/15/2022 1213 15 : Small bowel mesenteric nodules Tissue Small Bowel SURGICAL PATHOLOGY EXAM Marthann Schiller, MD 02/15/2022 1215 16 : Colon mesenteric nodule Tissue Colon SURGICAL PATHOLOGY EXAM Marthann Schiller, MD 02/15/2022 1221 17 : Hepatic flexure mass Tissue Liver SURGICAL PATHOLOGY EXAM Marthann Schiller, MD [id number]  Complications: None  Indications for Procedure: [name] is a 52 y.o. 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 Prior to the procedure, all risks, benefits, and alternatives were discussed and informed surgical consent was signed.  Procedure: Patient was taken to the operating room where general anesthesia was achieved. She was positioned in dorsal lithotomy and prepped and draped per the surgical oncology team. A foley catheter was inserted into the bladder. A 5 mm skin incision was made in the left upper quadrant at Palmer's point, the abdomen was then entered with direct Optiview entry and the abdomen was insufflated. Findings were noted as above.  The laparoscope was then removed from the abdomen, the pneumoperitoneum was maintained. A vertical midline incision was made with the scalpel and the abdomen was entered sharply. The abdomen and pelvis were surveyed with findings as documented above. A wound protector was placed followed by the [facility] retractor.  The enlarged left adnexal mass was  then carefully removed of its bilateral sidewall attachments using a mixture of blunt and cautery dissection. Multiple small bowel loops were adhered to the superior and posterior aspect of the mass with filmy adhesions which were carefully bluntly resected. The rectosigmoid was noted to be densely adhered to the posterior aspect of the pelvic mass. Dissection was attempted using mixture of blunt dissection as well as sharp dissection with the Metzenbaum scissors, and unavoidable enterotomy of approximately 4 cm was made to the anterior surface of the rectosigmoid. This enterotomy was then closed with 4-0 PDS in a running fashion to minimize contamination. Further dissection removing the remainder of the rectosigmoid from the posterior aspect of the pelvic mass was then undertaken. The pelvic mass was then able to be fully elevated from the pelvis. The left infundibulopelvic ligament was able to be isolated with the left ureter clearly visualized below. The left ureter was further dissected away from the medial leaf of the broad ligament, and a vessel loop was placed. The left infundibulopelvic ligament was then isolated, clamped, and transected using the LigaSure device as well as and suture-ligated. The left utero-ovarian ligament was then isolated, clamped, cauterized and transected. The left pelvic mass was then removed entirely.  Attention was then turned to the right. An additional enlarged multicystic right adnexal mass was noted, this was carefully elevated from the pelvis using blunt dissection. The right round ligament was then grasped, elevated, and transected. The broad ligament was then opened posteriorly. The ureter was identified and the right infundibulopelvic ligament was then isolated, clamped, transected, and tied. The right utero ovarian ligament was then isolated, clamped, and transected using the  LigaSure device and the right adnexal mass was removed.  Attention then was turned toward the  hysterectomy. A sponge stick was placed within the vagina. The right broad ligament dissection was opened anteriorly and the bladder flap was developed. The left round ligament was then further transected, and the broad ligament opened posteriorly and anteriorly to complete the bladder flap. The right uterine artery was skeletonized, clamped, transected, and suture-ligated. There was significant parametrial tumor involvement on the left side, and therefore a radical dissection was required to isolate the blood supply. The left ureter was further dissected down to the level near the trigone. The left uterine artery was traced to its origin, isolated, and clamped with vascular clips. Similarly the left uterine vein due to significant necrotic tumor involvement was friable. Detachment was similarly carefully isolated, and clipped with vascular clips. Sequential clamps were then used to transect the remainder of the broad and cardinal ligaments bilaterally, with each pedicle being suture-ligated. A large right angle clamp was then able to be placed below the cervix, and the uterus and cervix were amputated from the vagina. The vaginal cuff was then closed with several figure-of-eight sutures using 0 Vicryl.    02/15/2022 Pathology Results   A: Abdominal fat pad, excision - Fibroadipose tissue with residual mixed malignant germ cell tumor with treatment effect   B: Left adnexa, salpingo-oophorectomy - Residual mixed malignant germ cell tumor with treatment effect, [id number] (See Comment and Synoptic report) - FIGO stage IIIC - Fallopian tubes with no tumor present   C: Right adnexa, salpingo-oophorectomy - Residual mixed malignant germ cell tumor with treatment effect (See Synoptic report) - Fallopian tubes not identified   D: Left parametrium, biopsy - Fibroadipose tissue with residual mixed malignant germ cell tumor with treatment effect   E: Uterus and cervix, hysterectomy - Right adnexal soft tissue  with residual mixed malignant germ cell tumor with treatment effect   Myometrium: - Adenomyosis - Leiomyomata measuring up to 0.7 cm - Extensive chronic serositis   Cervix:  - Ectocervix and endocervix with chronic cervicitis    Endometrium: - Inactive endometrium   F: Low anterior resection - Residual mixed malignant  germ cell tumor with treatment effect involving colonic serosa and pericolonic adipose tissue - Proximal and distal colonic resection margins with chronic serositis, but negative for tumor    G: Right peritoneum, biopsy - Fibroadipose tissue with residual mixed malignant germ cell tumor with treatment effect   H: Right retroperitoneal mass, excision - Fibroadipose tissue with residual mixed malignant germ cell tumor with treatment effect   I: Omentum, omentectomy - Fibroadipose tissue with residual mixed malignant germ cell tumor with treatment effect   J: Gallbladder, cholecystectomy - Gallbladder with mild chronic serositis and fibrous adhesion   K: Right diaphragm, excision - Fibrous tissue with residual mixed malignant germ cell tumor with treatment effect - Hepatic tissue with no tumor present   L: Left diaphragm nodule, biopsy - Fibrous tissue with residual mixed malignant germ cell tumor with treatment effect   M: Spleen, splenectomy - Residual mixed malignant germ cell tumor with treatment effect involving splenic capsule   N: Appendix, appendectomy - Appendix with fibrous obliteration at the tip - Periappendiceal adipose tissue with acute and chronic inflammation   O: Small bowel mesenteric nodules, excision - Fibroadipose tissue with residual mixed malignant germ cell tumor with treatment effect   P: Mesenteric nodules, excision - Fibroadipose tissue with residual mixed malignant germ cell tumor with treatment effect   Q:  Hepatic flexure mass, excision - Fibroadipose tissue with residual mixed malignant germ cell tumor with treatment effect    R: Anvil and donut, biopsy - Benign segment of colon with acute and chronic inflammation and fibrous adhesion  SPECIMEN    Procedure:    Radical hysterectomy     Procedure:    Omentectomy     Procedure:    Peritoneal tumor debulking     Hysterectomy Type:    Abdominal     Specimen Integrity:          Right Ovary Integrity:    Capsule intact     Specimen Integrity:          Left Ovary Integrity:    Capsule intact   TUMOR    Tumor Site:    Bilateral ovaries     Tumor Size:    Greatest Dimension (Centimeters): 19.5 cm     Histologic Type:    Mixed malignant germ cell tumor:  yolk sac tumor, mature teratoma, mature neural elements, immature neural tissue, and immature cartilage     Ovarian Surface Involvement:    Present, right and left     Other Tissue / Organ Involvement:    Right ovary     Other Tissue / Organ Involvement:    Left ovary     Other Tissue / Organ Involvement:    Right fallopian tube     Other Tissue / Organ Involvement:    Omentum     Other Tissue / Organ Involvement:    Diaphragm and splenic capsule     Largest Extrapelvic Peritoneal Focus:    Macroscopic (greater than 2 cm): Splenic capsule     Peritoneal / Ascitic Fluid Involvement:    Not submitted / unknown   REGIONAL LYMPH NODES     Regional Lymph Node Status:    Not applicable (no regional lymph nodes submitted or found)   DISTANT METASTASIS     Distant Site(s) Involved:    Diaphragm, splenic capsule   pTNM CLASSIFICATION (AJCC 8th Edition)     Reporting of pT, pN, and (when applicable) pM categories is based on information available to the pathologist at the time the report is issued. As per the AJCC (Chapter 1, 8th Ed.) it is the managing physician's responsibility to establish the final pathologic stage based upon all pertinent information, including but potentially not limited to this pathology report.     Modified Classification:    y     pT Category:    pT3c     pN Category:    pN not assigned (no  nodes submitted or found)     pM Category:    pM1   FIGO STAGE     FIGO Stage:    IIIC   COMMENT:  The needle core biopsies show a heterogeneous combination of elements including abundant neural tissue with atypical and immature features. There are also atypical glandular epithelial structures and focal yolk sac elements with microcystic pattern.  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  Immunohistochemistry is performed for cytokeratin AE1/AE3, CD117, CD30, G FAP, glypican-3, OCT3/4, synaptophysin, S100 and CD30.     03/14/2022 -  Chemotherapy   Patient is on Treatment Plan : Malignant germ cell tumor of ovary D1-5 q21d     03/22/2022 Tumor Marker   Patient's tumor was tested for the following markers: AFP. Results of the tumor marker test revealed 7.9.  PHYSICAL EXAMINATION: ECOG PERFORMANCE STATUS: 1 - Symptomatic but completely ambulatory  Vitals:   04/01/22 1219  BP: 106/81  Pulse: (!) 108  Resp: 18  Temp: 98.9 F (37.2 C)  SpO2: 100%   Filed Weights   04/01/22 1219  Weight: 99 lb 6.4 oz (45.1 kg)    GENERAL:alert, no distress and comfortable SKIN: skin color, texture, turgor are normal, no rashes or significant lesions EYES: normal, Conjunctiva are pink and non-injected, sclera clear OROPHARYNX: Noted mild inflammation of the gumline.  No canker sore  NEURO: alert & oriented x 3 with fluent speech, no focal motor/sensory deficits  LABORATORY DATA:  I have reviewed the data as listed    Component Value Date/Time   NA 139 04/01/2022 1147   K 4.3 04/01/2022 1147   CL 104 04/01/2022 1147   CO2 30 04/01/2022 1147   GLUCOSE 98 04/01/2022 1147   BUN 11 04/01/2022 1147   CREATININE 0.53 04/01/2022 1147   CREATININE 0.75 03/23/2011 1550   CALCIUM 9.6 04/01/2022 1147   PROT 6.8 04/01/2022 1147   ALBUMIN 4.2 04/01/2022 1147   AST 13 (L) 04/01/2022 1147   ALT 21 04/01/2022 1147    ALKPHOS 69 04/01/2022 1147   BILITOT 0.3 04/01/2022 1147   GFRNONAA >60 04/01/2022 1147    No results found for: "SPEP", "UPEP"  Lab Results  Component Value Date   WBC 2.0 (L) 04/01/2022   NEUTROABS 0.1 (LL) 04/01/2022   HGB 11.0 (L) 04/01/2022   HCT 34.0 (L) 04/01/2022   MCV 92.1 04/01/2022   PLT 260 04/01/2022      Chemistry      Component Value Date/Time   NA 139 04/01/2022 1147   K 4.3 04/01/2022 1147   CL 104 04/01/2022 1147   CO2 30 04/01/2022 1147   BUN 11 04/01/2022 1147   CREATININE 0.53 04/01/2022 1147   CREATININE 0.75 03/23/2011 1550      Component Value Date/Time   CALCIUM 9.6 04/01/2022 1147   ALKPHOS 69 04/01/2022 1147   AST 13 (L) 04/01/2022 1147   ALT 21 04/01/2022 1147   BILITOT 0.3 04/01/2022 1147

## 2022-04-01 NOTE — Telephone Encounter (Signed)
She called to report she is having gum swelling/ puffiness that start last night. She is having teeth pain now and took tramadol that helped with the pain. Denies fever. When she flossed last night she had a little gum bleeding.  She is scheduled for labs and blood this afternoon.

## 2022-04-01 NOTE — Assessment & Plan Note (Signed)
She is symptomatic from mucositis Recommend Magic mouthwash with lidocaine and G-CSF support for next visit I recommend against flossing She does not need transfusion support

## 2022-04-01 NOTE — Telephone Encounter (Signed)
Can she come in earlier? I can see her at 1240 pm. Move labs before that

## 2022-04-01 NOTE — Assessment & Plan Note (Signed)
Due to recent chemotherapy We discussed the role of conservative approach I recommend addition of G-CSF for next cycle

## 2022-04-01 NOTE — Assessment & Plan Note (Signed)
Overall, she tolerated recent treatment well except for dehydration and fluid retention She is back to her baseline today except for mucositis We discussed conservative approach for mucositis We will proceed with treatment without delay but I recommend addition of G-CSF support with next cycle  She will get imaging study done in April

## 2022-04-01 NOTE — Telephone Encounter (Signed)
Called and given below. She is agreeable to appt at 1240 to see Dr. Alvy Bimler. Added lab only prior to MD appt. She is aware of appts.

## 2022-04-01 NOTE — Telephone Encounter (Signed)
Called and left a message asking her to call the office back. She is neutropenic and she will get scheduled treatment next week. We will work on getting PA for G-CSF support. Continue using [facility] and we will check on her Monday.  She called back. Given above message. She appreciated the call and verbalized understanding.

## 2022-04-04 ENCOUNTER — Inpatient Hospital Stay: Payer: 59

## 2022-04-04 ENCOUNTER — Other Ambulatory Visit: Payer: Self-pay

## 2022-04-04 VITALS — BP 114/71 | HR 103 | Temp 98.3°F | Resp 16 | Wt 101.5 lb

## 2022-04-04 DIAGNOSIS — Z5111 Encounter for antineoplastic chemotherapy: Secondary | ICD-10-CM | POA: Diagnosis not present

## 2022-04-04 DIAGNOSIS — C569 Malignant neoplasm of unspecified ovary: Secondary | ICD-10-CM

## 2022-04-04 MED ORDER — POTASSIUM CHLORIDE IN NACL 20-0.9 MEQ/L-% IV SOLN
Freq: Once | INTRAVENOUS | Status: AC
  Filled 2022-04-04: qty 1000

## 2022-04-04 MED ORDER — SODIUM CHLORIDE 0.9 % IV SOLN
15.0000 mg/m2 | Freq: Once | INTRAVENOUS | Status: AC
  Administered 2022-04-04: 20 mg via INTRAVENOUS
  Filled 2022-04-04: qty 20

## 2022-04-04 MED ORDER — SODIUM CHLORIDE 0.9 % IV SOLN
150.0000 mg | Freq: Once | INTRAVENOUS | Status: AC
  Administered 2022-04-04: 150 mg via INTRAVENOUS
  Filled 2022-04-04: qty 150

## 2022-04-04 MED ORDER — SODIUM CHLORIDE 0.9 % IV SOLN: Freq: Once | INTRAVENOUS | Status: AC

## 2022-04-04 MED ORDER — MAGNESIUM SULFATE 2 GM/50ML IV SOLN
2.0000 g | Freq: Once | INTRAVENOUS | Status: AC
  Administered 2022-04-04: 2 g via INTRAVENOUS
  Filled 2022-04-04: qty 50

## 2022-04-04 MED ORDER — SODIUM CHLORIDE 0.9% FLUSH
10.0000 mL | INTRAVENOUS | Status: DC | PRN
  Administered 2022-04-04: 10 mL

## 2022-04-04 MED ORDER — HEPARIN SOD (PORK) LOCK FLUSH 100 UNIT/ML IV SOLN
500.0000 [IU] | Freq: Once | INTRAVENOUS | Status: AC | PRN
  Administered 2022-04-04: 500 [IU]

## 2022-04-04 MED ORDER — PALONOSETRON HCL INJECTION 0.25 MG/5ML
0.2500 mg | Freq: Once | INTRAVENOUS | Status: AC
  Administered 2022-04-04: 0.25 mg via INTRAVENOUS
  Filled 2022-04-04: qty 5

## 2022-04-04 MED ORDER — SODIUM CHLORIDE 0.9 % IV SOLN
150.0000 mg | Freq: Once | INTRAVENOUS | Status: DC
  Filled 2022-04-04: qty 5

## 2022-04-04 MED ORDER — SODIUM CHLORIDE 0.9 % IV SOLN
10.0000 mg | Freq: Once | INTRAVENOUS | Status: AC
  Administered 2022-04-04: 10 mg via INTRAVENOUS
  Filled 2022-04-04: qty 10

## 2022-04-04 MED ORDER — SODIUM CHLORIDE 0.9 % IV SOLN
95.0000 mg/m2 | Freq: Once | INTRAVENOUS | Status: AC
  Administered 2022-04-04: 130 mg via INTRAVENOUS
  Filled 2022-04-04: qty 6.5

## 2022-04-04 MED ORDER — FAMOTIDINE IN NACL 20-0.9 MG/50ML-% IV SOLN
20.0000 mg | Freq: Once | INTRAVENOUS | Status: AC
  Administered 2022-04-04: 20 mg via INTRAVENOUS
  Filled 2022-04-04: qty 50

## 2022-04-04 MED FILL — Dexamethasone Sodium Phosphate Inj 100 MG/10ML: INTRAMUSCULAR | Qty: 1 | Status: AC

## 2022-04-04 NOTE — Patient Instructions (Signed)
[facility]  Discharge Instructions: Thank you for choosing [facility] to provide your oncology and hematology care.   If you have a lab appointment with the [facility], please go directly to the [facility] and check in at the registration area.   Wear comfortable clothing and clothing appropriate for easy access to any Portacath or PICC line.   We strive to give you quality time with your provider. You may need to reschedule your appointment if you arrive late (15 or more minutes).  Arriving late affects you and other patients whose appointments are after yours.  Also, if you miss three or more appointments without notifying the office, you may be dismissed from the clinic at the provider's discretion.      For prescription refill requests, have your pharmacy contact our office and allow 72 hours for refills to be completed.    Today you received the following chemotherapy and/or immunotherapy agents: Platinol, Vepesid      To help prevent nausea and vomiting after your treatment, we encourage you to take your nausea medication as directed.  BELOW ARE SYMPTOMS THAT SHOULD BE REPORTED IMMEDIATELY: *FEVER GREATER THAN 100.4 F (38 C) OR HIGHER *CHILLS OR SWEATING *NAUSEA AND VOMITING THAT IS NOT CONTROLLED WITH YOUR NAUSEA MEDICATION *UNUSUAL SHORTNESS OF BREATH *UNUSUAL BRUISING OR BLEEDING *URINARY PROBLEMS (pain or burning when urinating, or frequent urination) *BOWEL PROBLEMS (unusual diarrhea, constipation, pain near the anus) TENDERNESS IN MOUTH AND THROAT WITH OR WITHOUT PRESENCE OF ULCERS (sore throat, sores in mouth, or a toothache) UNUSUAL RASH, SWELLING OR PAIN  UNUSUAL VAGINAL DISCHARGE OR ITCHING   Items with * indicate a potential emergency and should be followed up as soon as possible or go to the Emergency Department if any problems should occur.  Please show the CHEMOTHERAPY ALERT CARD or IMMUNOTHERAPY ALERT CARD  at check-in to the Emergency Department and triage nurse.  Should you have questions after your visit or need to cancel or reschedule your appointment, please contact [facility]  Dept: [phone]  and follow the prompts.  Office hours are 8:00 a.m. to 4:30 p.m. Monday - Friday. Please note that voicemails left after 4:00 p.m. may not be returned until the following business day.  We are closed weekends and major holidays. You have access to a nurse at all times for urgent questions. Please call the main number to the clinic Dept: [phone] and follow the prompts.   For any non-urgent questions, you may also contact your provider using MyChart. We now offer e-Visits for anyone 30 and older to request care online for non-urgent symptoms. For details visit [email or web address].   Also download the MyChart app! Go to the app store, search "MyChart", open the app, select [facility], and log in with your MyChart username and password.

## 2022-04-05 ENCOUNTER — Encounter: Payer: Self-pay | Admitting: General Practice

## 2022-04-05 ENCOUNTER — Inpatient Hospital Stay: Payer: 59

## 2022-04-05 ENCOUNTER — Other Ambulatory Visit (HOSPITAL_COMMUNITY): Payer: Self-pay | Admitting: Nurse Practitioner

## 2022-04-05 VITALS — BP 113/79 | HR 93 | Temp 99.0°F | Resp 18 | Wt 104.8 lb

## 2022-04-05 DIAGNOSIS — Z5111 Encounter for antineoplastic chemotherapy: Secondary | ICD-10-CM | POA: Diagnosis not present

## 2022-04-05 DIAGNOSIS — K9413 Enterostomy malfunction: Secondary | ICD-10-CM

## 2022-04-05 DIAGNOSIS — C569 Malignant neoplasm of unspecified ovary: Secondary | ICD-10-CM

## 2022-04-05 DIAGNOSIS — L24B3 Irritant contact dermatitis related to fecal or urinary stoma or fistula: Secondary | ICD-10-CM

## 2022-04-05 MED ORDER — SODIUM CHLORIDE 0.9 % IV SOLN
10.0000 mg | Freq: Once | INTRAVENOUS | Status: AC
  Administered 2022-04-05: 10 mg via INTRAVENOUS
  Filled 2022-04-05: qty 10

## 2022-04-05 MED ORDER — HEPARIN SOD (PORK) LOCK FLUSH 100 UNIT/ML IV SOLN
500.0000 [IU] | Freq: Once | INTRAVENOUS | Status: AC | PRN
  Administered 2022-04-05: 500 [IU]

## 2022-04-05 MED ORDER — SODIUM CHLORIDE 0.9 % IV SOLN: Freq: Once | INTRAVENOUS | Status: AC

## 2022-04-05 MED ORDER — SODIUM CHLORIDE 0.9 % IV SOLN
95.0000 mg/m2 | Freq: Once | INTRAVENOUS | Status: AC
  Administered 2022-04-05: 130 mg via INTRAVENOUS
  Filled 2022-04-05: qty 6.5

## 2022-04-05 MED ORDER — SODIUM CHLORIDE 0.9 % IV SOLN
15.0000 mg/m2 | Freq: Once | INTRAVENOUS | Status: AC
  Administered 2022-04-05: 20 mg via INTRAVENOUS
  Filled 2022-04-05: qty 20

## 2022-04-05 MED ORDER — POTASSIUM CHLORIDE IN NACL 20-0.9 MEQ/L-% IV SOLN
Freq: Once | INTRAVENOUS | Status: AC
  Filled 2022-04-05: qty 1000

## 2022-04-05 MED ORDER — SODIUM CHLORIDE 0.9% FLUSH
10.0000 mL | INTRAVENOUS | Status: DC | PRN
  Administered 2022-04-05: 10 mL

## 2022-04-05 MED ORDER — FAMOTIDINE IN NACL 20-0.9 MG/50ML-% IV SOLN
20.0000 mg | Freq: Once | INTRAVENOUS | Status: AC
  Administered 2022-04-05: 20 mg via INTRAVENOUS
  Filled 2022-04-05: qty 50

## 2022-04-05 MED ORDER — MAGNESIUM SULFATE 2 GM/50ML IV SOLN
2.0000 g | Freq: Once | INTRAVENOUS | Status: AC
  Administered 2022-04-05: 2 g via INTRAVENOUS
  Filled 2022-04-05: qty 50

## 2022-04-05 MED FILL — Dexamethasone Sodium Phosphate Inj 100 MG/10ML: INTRAMUSCULAR | Qty: 1 | Status: AC

## 2022-04-05 MED FILL — Fosaprepitant Dimeglumine For IV Infusion 150 MG (Base Eq): INTRAVENOUS | Qty: 5 | Status: AC

## 2022-04-05 NOTE — Progress Notes (Signed)
[facility] Spiritual Care Note  Followed up with Rodena Piety in infusion. She is doing a lot of emotional and relational processing of her treatment experience, which is a significant strength, especially because processing can be so difficult or limited due to the energetic compromise that treatment often brings. She is also reflecting on what this experience means for her sense of purpose and career trajectory in the future.  Provided pastoral presence, empathic listening, emotional support, normalization of feelings, and affirmation of strengths. Will continue to follow for such support.   [place], [place], [facility] Pager [phone] Voicemail [phone]

## 2022-04-05 NOTE — Patient Instructions (Signed)
[facility]  Discharge Instructions: Thank you for choosing [facility] to provide your oncology and hematology care.   If you have a lab appointment with the [facility], please go directly to the [facility] and check in at the registration area.   Wear comfortable clothing and clothing appropriate for easy access to any Portacath or PICC line.   We strive to give you quality time with your provider. You may need to reschedule your appointment if you arrive late (15 or more minutes).  Arriving late affects you and other patients whose appointments are after yours.  Also, if you miss three or more appointments without notifying the office, you may be dismissed from the clinic at the provider's discretion.      For prescription refill requests, have your pharmacy contact our office and allow 72 hours for refills to be completed.    Today you received the following chemotherapy and/or immunotherapy agents: Platinol, Vepesid      To help prevent nausea and vomiting after your treatment, we encourage you to take your nausea medication as directed.  BELOW ARE SYMPTOMS THAT SHOULD BE REPORTED IMMEDIATELY: *FEVER GREATER THAN 100.4 F (38 C) OR HIGHER *CHILLS OR SWEATING *NAUSEA AND VOMITING THAT IS NOT CONTROLLED WITH YOUR NAUSEA MEDICATION *UNUSUAL SHORTNESS OF BREATH *UNUSUAL BRUISING OR BLEEDING *URINARY PROBLEMS (pain or burning when urinating, or frequent urination) *BOWEL PROBLEMS (unusual diarrhea, constipation, pain near the anus) TENDERNESS IN MOUTH AND THROAT WITH OR WITHOUT PRESENCE OF ULCERS (sore throat, sores in mouth, or a toothache) UNUSUAL RASH, SWELLING OR PAIN  UNUSUAL VAGINAL DISCHARGE OR ITCHING   Items with * indicate a potential emergency and should be followed up as soon as possible or go to the Emergency Department if any problems should occur.  Please show the CHEMOTHERAPY ALERT CARD or IMMUNOTHERAPY ALERT CARD  at check-in to the Emergency Department and triage nurse.  Should you have questions after your visit or need to cancel or reschedule your appointment, please contact [facility]  Dept: [phone]  and follow the prompts.  Office hours are 8:00 a.m. to 4:30 p.m. Monday - Friday. Please note that voicemails left after 4:00 p.m. may not be returned until the following business day.  We are closed weekends and major holidays. You have access to a nurse at all times for urgent questions. Please call the main number to the clinic Dept: [phone] and follow the prompts.   For any non-urgent questions, you may also contact your provider using MyChart. We now offer e-Visits for anyone 7 and older to request care online for non-urgent symptoms. For details visit [email or web address].   Also download the MyChart app! Go to the app store, search "MyChart", open the app, select [facility], and log in with your MyChart username and password.

## 2022-04-06 ENCOUNTER — Other Ambulatory Visit (HOSPITAL_COMMUNITY): Payer: Self-pay

## 2022-04-06 ENCOUNTER — Other Ambulatory Visit: Payer: Self-pay | Admitting: Gynecologic Oncology

## 2022-04-06 ENCOUNTER — Inpatient Hospital Stay: Payer: 59

## 2022-04-06 VITALS — BP 118/67 | HR 78 | Temp 98.3°F | Resp 17 | Wt 106.8 lb

## 2022-04-06 DIAGNOSIS — C569 Malignant neoplasm of unspecified ovary: Secondary | ICD-10-CM

## 2022-04-06 DIAGNOSIS — Z5111 Encounter for antineoplastic chemotherapy: Secondary | ICD-10-CM | POA: Diagnosis not present

## 2022-04-06 DIAGNOSIS — Z79899 Other long term (current) drug therapy: Secondary | ICD-10-CM

## 2022-04-06 MED ORDER — MAGNESIUM SULFATE 2 GM/50ML IV SOLN
2.0000 g | Freq: Once | INTRAVENOUS | Status: AC
  Administered 2022-04-06: 2 g via INTRAVENOUS
  Filled 2022-04-06: qty 50

## 2022-04-06 MED ORDER — SODIUM CHLORIDE 0.9 % IV SOLN
95.0000 mg/m2 | Freq: Once | INTRAVENOUS | Status: AC
  Administered 2022-04-06: 130 mg via INTRAVENOUS
  Filled 2022-04-06: qty 6.5

## 2022-04-06 MED ORDER — SODIUM CHLORIDE 0.9 % IV SOLN
15.0000 mg/m2 | Freq: Once | INTRAVENOUS | Status: AC
  Administered 2022-04-06: 20 mg via INTRAVENOUS
  Filled 2022-04-06: qty 20

## 2022-04-06 MED ORDER — SODIUM CHLORIDE 0.9 % IV SOLN
150.0000 mg | Freq: Once | INTRAVENOUS | Status: AC
  Administered 2022-04-06: 150 mg via INTRAVENOUS
  Filled 2022-04-06: qty 150

## 2022-04-06 MED ORDER — APIXABAN 2.5 MG PO TABS
2.5000 mg | ORAL_TABLET | Freq: Two times a day (BID) | ORAL | 0 refills | Status: DC
  Filled 2022-04-06: qty 60, 30d supply, fill #0

## 2022-04-06 MED ORDER — FAMOTIDINE IN NACL 20-0.9 MG/50ML-% IV SOLN
20.0000 mg | Freq: Once | INTRAVENOUS | Status: AC
  Administered 2022-04-06: 20 mg via INTRAVENOUS
  Filled 2022-04-06: qty 50

## 2022-04-06 MED ORDER — SODIUM CHLORIDE 0.9% FLUSH
10.0000 mL | INTRAVENOUS | Status: DC | PRN
  Administered 2022-04-06: 10 mL

## 2022-04-06 MED ORDER — HEPARIN SOD (PORK) LOCK FLUSH 100 UNIT/ML IV SOLN
500.0000 [IU] | Freq: Once | INTRAVENOUS | Status: AC | PRN
  Administered 2022-04-06: 500 [IU]

## 2022-04-06 MED ORDER — POTASSIUM CHLORIDE IN NACL 20-0.9 MEQ/L-% IV SOLN
Freq: Once | INTRAVENOUS | Status: AC
  Filled 2022-04-06: qty 1000

## 2022-04-06 MED ORDER — SODIUM CHLORIDE 0.9 % IV SOLN
10.0000 mg | Freq: Once | INTRAVENOUS | Status: AC
  Administered 2022-04-06: 10 mg via INTRAVENOUS
  Filled 2022-04-06: qty 10

## 2022-04-06 MED ORDER — SODIUM CHLORIDE 0.9 % IV SOLN: Freq: Once | INTRAVENOUS | Status: AC

## 2022-04-06 MED ORDER — PALONOSETRON HCL INJECTION 0.25 MG/5ML
0.2500 mg | Freq: Once | INTRAVENOUS | Status: AC
  Administered 2022-04-06: 0.25 mg via INTRAVENOUS
  Filled 2022-04-06: qty 5

## 2022-04-06 MED FILL — Dexamethasone Sodium Phosphate Inj 100 MG/10ML: INTRAMUSCULAR | Qty: 1 | Status: AC

## 2022-04-06 NOTE — Patient Instructions (Signed)
[facility]  Discharge Instructions: Thank you for choosing [facility] to provide your oncology and hematology care.   If you have a lab appointment with the [facility], please go directly to the [facility] and check in at the registration area.   Wear comfortable clothing and clothing appropriate for easy access to any Portacath or PICC line.   We strive to give you quality time with your provider. You may need to reschedule your appointment if you arrive late (15 or more minutes).  Arriving late affects you and other patients whose appointments are after yours.  Also, if you miss three or more appointments without notifying the office, you may be dismissed from the clinic at the provider's discretion.      For prescription refill requests, have your pharmacy contact our office and allow 72 hours for refills to be completed.    Today you received the following chemotherapy and/or immunotherapy agents: Cisplatin and Etoposide      To help prevent nausea and vomiting after your treatment, we encourage you to take your nausea medication as directed.  BELOW ARE SYMPTOMS THAT SHOULD BE REPORTED IMMEDIATELY: *FEVER GREATER THAN 100.4 F (38 C) OR HIGHER *CHILLS OR SWEATING *NAUSEA AND VOMITING THAT IS NOT CONTROLLED WITH YOUR NAUSEA MEDICATION *UNUSUAL SHORTNESS OF BREATH *UNUSUAL BRUISING OR BLEEDING *URINARY PROBLEMS (pain or burning when urinating, or frequent urination) *BOWEL PROBLEMS (unusual diarrhea, constipation, pain near the anus) TENDERNESS IN MOUTH AND THROAT WITH OR WITHOUT PRESENCE OF ULCERS (sore throat, sores in mouth, or a toothache) UNUSUAL RASH, SWELLING OR PAIN  UNUSUAL VAGINAL DISCHARGE OR ITCHING   Items with * indicate a potential emergency and should be followed up as soon as possible or go to the Emergency Department if any problems should occur.  Please show the CHEMOTHERAPY ALERT CARD or IMMUNOTHERAPY ALERT  CARD at check-in to the Emergency Department and triage nurse.  Should you have questions after your visit or need to cancel or reschedule your appointment, please contact [facility]  Dept: [phone]  and follow the prompts.  Office hours are 8:00 a.m. to 4:30 p.m. Monday - Friday. Please note that voicemails left after 4:00 p.m. may not be returned until the following business day.  We are closed weekends and major holidays. You have access to a nurse at all times for urgent questions. Please call the main number to the clinic Dept: [phone] and follow the prompts.   For any non-urgent questions, you may also contact your provider using MyChart. We now offer e-Visits for anyone 46 and older to request care online for non-urgent symptoms. For details visit [email or web address].   Also download the MyChart app! Go to the app store, search "MyChart", open the app, select [facility], and log in with your MyChart username and password.

## 2022-04-07 ENCOUNTER — Encounter: Payer: Self-pay | Admitting: Hematology and Oncology

## 2022-04-07 ENCOUNTER — Inpatient Hospital Stay: Payer: 59

## 2022-04-07 ENCOUNTER — Other Ambulatory Visit (HOSPITAL_COMMUNITY): Payer: Self-pay

## 2022-04-07 VITALS — BP 130/75 | HR 63 | Temp 98.2°F | Resp 16 | Wt 107.6 lb

## 2022-04-07 DIAGNOSIS — Z5111 Encounter for antineoplastic chemotherapy: Secondary | ICD-10-CM | POA: Diagnosis not present

## 2022-04-07 DIAGNOSIS — C569 Malignant neoplasm of unspecified ovary: Secondary | ICD-10-CM

## 2022-04-07 MED ORDER — FAMOTIDINE IN NACL 20-0.9 MG/50ML-% IV SOLN
20.0000 mg | Freq: Once | INTRAVENOUS | Status: AC
  Administered 2022-04-07: 20 mg via INTRAVENOUS
  Filled 2022-04-07: qty 50

## 2022-04-07 MED ORDER — SODIUM CHLORIDE 0.9 % IV SOLN
95.0000 mg/m2 | Freq: Once | INTRAVENOUS | Status: AC
  Administered 2022-04-07: 130 mg via INTRAVENOUS
  Filled 2022-04-07: qty 6.5

## 2022-04-07 MED ORDER — SODIUM CHLORIDE 0.9 % IV SOLN
15.0000 mg/m2 | Freq: Once | INTRAVENOUS | Status: AC
  Administered 2022-04-07: 20 mg via INTRAVENOUS
  Filled 2022-04-07: qty 20

## 2022-04-07 MED ORDER — SODIUM CHLORIDE 0.9% FLUSH
10.0000 mL | INTRAVENOUS | Status: DC | PRN
  Administered 2022-04-07: 10 mL

## 2022-04-07 MED ORDER — POTASSIUM CHLORIDE IN NACL 20-0.9 MEQ/L-% IV SOLN
Freq: Once | INTRAVENOUS | Status: AC
  Filled 2022-04-07: qty 1000

## 2022-04-07 MED ORDER — HEPARIN SOD (PORK) LOCK FLUSH 100 UNIT/ML IV SOLN
500.0000 [IU] | Freq: Once | INTRAVENOUS | Status: AC | PRN
  Administered 2022-04-07: 500 [IU]

## 2022-04-07 MED ORDER — SODIUM CHLORIDE 0.9 % IV SOLN
10.0000 mg | Freq: Once | INTRAVENOUS | Status: AC
  Administered 2022-04-07: 10 mg via INTRAVENOUS
  Filled 2022-04-07: qty 10

## 2022-04-07 MED ORDER — SODIUM CHLORIDE 0.9 % IV SOLN: Freq: Once | INTRAVENOUS | Status: AC

## 2022-04-07 MED ORDER — MAGNESIUM SULFATE 2 GM/50ML IV SOLN
2.0000 g | Freq: Once | INTRAVENOUS | Status: AC
  Administered 2022-04-07: 2 g via INTRAVENOUS
  Filled 2022-04-07: qty 50

## 2022-04-07 MED FILL — Dexamethasone Sodium Phosphate Inj 100 MG/10ML: INTRAMUSCULAR | Qty: 1 | Status: AC

## 2022-04-07 MED FILL — Fosaprepitant Dimeglumine For IV Infusion 150 MG (Base Eq): INTRAVENOUS | Qty: 5 | Status: AC

## 2022-04-08 ENCOUNTER — Inpatient Hospital Stay: Payer: 59

## 2022-04-08 VITALS — BP 127/73 | HR 78 | Temp 98.3°F | Resp 12 | Wt 106.5 lb

## 2022-04-08 DIAGNOSIS — C569 Malignant neoplasm of unspecified ovary: Secondary | ICD-10-CM

## 2022-04-08 DIAGNOSIS — Z5111 Encounter for antineoplastic chemotherapy: Secondary | ICD-10-CM | POA: Diagnosis not present

## 2022-04-08 MED ORDER — SODIUM CHLORIDE 0.9 % IV SOLN
15.0000 mg/m2 | Freq: Once | INTRAVENOUS | Status: AC
  Administered 2022-04-08: 20 mg via INTRAVENOUS
  Filled 2022-04-08: qty 20

## 2022-04-08 MED ORDER — SODIUM CHLORIDE 0.9 % IV SOLN
150.0000 mg | Freq: Once | INTRAVENOUS | Status: AC
  Administered 2022-04-08: 150 mg via INTRAVENOUS
  Filled 2022-04-08: qty 150

## 2022-04-08 MED ORDER — FAMOTIDINE IN NACL 20-0.9 MG/50ML-% IV SOLN
20.0000 mg | Freq: Once | INTRAVENOUS | Status: AC
  Administered 2022-04-08: 20 mg via INTRAVENOUS
  Filled 2022-04-08: qty 50

## 2022-04-08 MED ORDER — MAGNESIUM SULFATE 2 GM/50ML IV SOLN
2.0000 g | Freq: Once | INTRAVENOUS | Status: AC
  Administered 2022-04-08: 2 g via INTRAVENOUS
  Filled 2022-04-08: qty 50

## 2022-04-08 MED ORDER — SODIUM CHLORIDE 0.9 % IV SOLN
10.0000 mg | Freq: Once | INTRAVENOUS | Status: AC
  Administered 2022-04-08: 10 mg via INTRAVENOUS
  Filled 2022-04-08: qty 10

## 2022-04-08 MED ORDER — HEPARIN SOD (PORK) LOCK FLUSH 100 UNIT/ML IV SOLN
500.0000 [IU] | Freq: Once | INTRAVENOUS | Status: AC | PRN
  Administered 2022-04-08: 500 [IU]

## 2022-04-08 MED ORDER — PALONOSETRON HCL INJECTION 0.25 MG/5ML
0.2500 mg | Freq: Once | INTRAVENOUS | Status: AC
  Administered 2022-04-08: 0.25 mg via INTRAVENOUS
  Filled 2022-04-08: qty 5

## 2022-04-08 MED ORDER — POTASSIUM CHLORIDE IN NACL 20-0.9 MEQ/L-% IV SOLN
Freq: Once | INTRAVENOUS | Status: AC
  Filled 2022-04-08: qty 1000

## 2022-04-08 MED ORDER — SODIUM CHLORIDE 0.9 % IV SOLN: Freq: Once | INTRAVENOUS | Status: AC

## 2022-04-08 MED ORDER — SODIUM CHLORIDE 0.9% FLUSH
10.0000 mL | INTRAVENOUS | Status: DC | PRN
  Administered 2022-04-08: 10 mL

## 2022-04-08 MED ORDER — SODIUM CHLORIDE 0.9 % IV SOLN
96.0000 mg/m2 | Freq: Once | INTRAVENOUS | Status: AC
  Administered 2022-04-08: 130 mg via INTRAVENOUS
  Filled 2022-04-08: qty 6.5

## 2022-04-08 NOTE — Patient Instructions (Signed)
[facility]  Discharge Instructions: Thank you for choosing [facility] to provide your oncology and hematology care.   If you have a lab appointment with the [facility], please go directly to the [facility] and check in at the registration area.   Wear comfortable clothing and clothing appropriate for easy access to any Portacath or PICC line.   We strive to give you quality time with your provider. You may need to reschedule your appointment if you arrive late (15 or more minutes).  Arriving late affects you and other patients whose appointments are after yours.  Also, if you miss three or more appointments without notifying the office, you may be dismissed from the clinic at the provider's discretion.      For prescription refill requests, have your pharmacy contact our office and allow 72 hours for refills to be completed.    Today you received the following chemotherapy and/or immunotherapy agents: Cisplatin, Etoposide      To help prevent nausea and vomiting after your treatment, we encourage you to take your nausea medication as directed.  BELOW ARE SYMPTOMS THAT SHOULD BE REPORTED IMMEDIATELY: *FEVER GREATER THAN 100.4 F (38 C) OR HIGHER *CHILLS OR SWEATING *NAUSEA AND VOMITING THAT IS NOT CONTROLLED WITH YOUR NAUSEA MEDICATION *UNUSUAL SHORTNESS OF BREATH *UNUSUAL BRUISING OR BLEEDING *URINARY PROBLEMS (pain or burning when urinating, or frequent urination) *BOWEL PROBLEMS (unusual diarrhea, constipation, pain near the anus) TENDERNESS IN MOUTH AND THROAT WITH OR WITHOUT PRESENCE OF ULCERS (sore throat, sores in mouth, or a toothache) UNUSUAL RASH, SWELLING OR PAIN  UNUSUAL VAGINAL DISCHARGE OR ITCHING   Items with * indicate a potential emergency and should be followed up as soon as possible or go to the Emergency Department if any problems should occur.  Please show the CHEMOTHERAPY ALERT CARD or IMMUNOTHERAPY ALERT  CARD at check-in to the Emergency Department and triage nurse.  Should you have questions after your visit or need to cancel or reschedule your appointment, please contact [facility]  Dept: [phone]  and follow the prompts.  Office hours are 8:00 a.m. to 4:30 p.m. Monday - Friday. Please note that voicemails left after 4:00 p.m. may not be returned until the following business day.  We are closed weekends and major holidays. You have access to a nurse at all times for urgent questions. Please call the main number to the clinic Dept: [phone] and follow the prompts.   For any non-urgent questions, you may also contact your provider using MyChart. We now offer e-Visits for anyone 3 and older to request care online for non-urgent symptoms. For details visit [email or web address].   Also download the MyChart app! Go to the app store, search "MyChart", open the app, select [facility], and log in with your MyChart username and password.

## 2022-04-11 ENCOUNTER — Other Ambulatory Visit: Payer: Self-pay | Admitting: Hematology and Oncology

## 2022-04-11 ENCOUNTER — Inpatient Hospital Stay: Payer: 59

## 2022-04-11 VITALS — BP 90/72 | HR 112 | Temp 98.3°F | Resp 14

## 2022-04-11 DIAGNOSIS — C569 Malignant neoplasm of unspecified ovary: Secondary | ICD-10-CM

## 2022-04-11 DIAGNOSIS — Z5111 Encounter for antineoplastic chemotherapy: Secondary | ICD-10-CM | POA: Diagnosis not present

## 2022-04-11 LAB — CBC WITH DIFFERENTIAL/PLATELET
Abs Immature Granulocytes: 0.14 10*3/uL — ABNORMAL HIGH (ref 0.00–0.07)
Basophils Absolute: 0 10*3/uL (ref 0.0–0.1)
Basophils Relative: 0 %
Eosinophils Absolute: 0 10*3/uL (ref 0.0–0.5)
Eosinophils Relative: 0 %
HCT: 38.4 % (ref 36.0–46.0)
Hemoglobin: 12.9 g/dL (ref 12.0–15.0)
Immature Granulocytes: 2 %
Lymphocytes Relative: 18 %
Lymphs Abs: 1.4 10*3/uL (ref 0.7–4.0)
MCH: 30.2 pg (ref 26.0–34.0)
MCHC: 33.6 g/dL (ref 30.0–36.0)
MCV: 89.9 fL (ref 80.0–100.0)
Monocytes Absolute: 0.1 10*3/uL (ref 0.1–1.0)
Monocytes Relative: 1 %
Neutro Abs: 6.1 10*3/uL (ref 1.7–7.7)
Neutrophils Relative %: 79 %
Platelets: 341 10*3/uL (ref 150–400)
RBC: 4.27 MIL/uL (ref 3.87–5.11)
RDW: 18.2 % — ABNORMAL HIGH (ref 11.5–15.5)
WBC: 7.7 10*3/uL (ref 4.0–10.5)
nRBC: 0 % (ref 0.0–0.2)

## 2022-04-11 LAB — SAMPLE TO BLOOD BANK

## 2022-04-11 MED ORDER — SODIUM CHLORIDE 0.9% FLUSH
10.0000 mL | Freq: Once | INTRAVENOUS | Status: AC | PRN
  Administered 2022-04-11: 10 mL

## 2022-04-11 MED ORDER — HEPARIN SOD (PORK) LOCK FLUSH 100 UNIT/ML IV SOLN
500.0000 [IU] | Freq: Once | INTRAVENOUS | Status: AC | PRN
  Administered 2022-04-11: 500 [IU]

## 2022-04-11 MED ORDER — PEGFILGRASTIM-CBQV 6 MG/0.6ML ~~LOC~~ SOSY
6.0000 mg | PREFILLED_SYRINGE | Freq: Once | SUBCUTANEOUS | Status: AC
  Administered 2022-04-11: 6 mg via SUBCUTANEOUS
  Filled 2022-04-11: qty 0.6

## 2022-04-11 MED ORDER — SODIUM CHLORIDE 0.9% FLUSH
10.0000 mL | Freq: Once | INTRAVENOUS | Status: AC
  Administered 2022-04-11: 10 mL

## 2022-04-11 MED ORDER — SODIUM CHLORIDE 0.9 % IV SOLN: Freq: Once | INTRAVENOUS | Status: AC

## 2022-04-11 NOTE — Patient Instructions (Signed)

## 2022-04-12 ENCOUNTER — Other Ambulatory Visit (HOSPITAL_COMMUNITY): Payer: Self-pay | Admitting: Nurse Practitioner

## 2022-04-12 ENCOUNTER — Ambulatory Visit (HOSPITAL_COMMUNITY)
Admission: RE | Admit: 2022-04-12 | Discharge: 2022-04-12 | Disposition: A | Discharge status: Home / Self Care | Payer: 59 | Source: Ambulatory Visit | Attending: Family Medicine | Admitting: Family Medicine

## 2022-04-12 DIAGNOSIS — E860 Dehydration: Secondary | ICD-10-CM | POA: Diagnosis not present

## 2022-04-12 DIAGNOSIS — R609 Edema, unspecified: Secondary | ICD-10-CM | POA: Insufficient documentation

## 2022-04-12 DIAGNOSIS — L24B3 Irritant contact dermatitis related to fecal or urinary stoma or fistula: Secondary | ICD-10-CM

## 2022-04-12 DIAGNOSIS — Z432 Encounter for attention to ileostomy: Secondary | ICD-10-CM | POA: Insufficient documentation

## 2022-04-12 DIAGNOSIS — R194 Change in bowel habit: Secondary | ICD-10-CM | POA: Diagnosis not present

## 2022-04-12 NOTE — Discharge Instructions (Signed)
Switching to hollister 1 pc convex 1 1/2" Barrier ring Barrier strip Stoma powder Skin prep Update [facility]

## 2022-04-12 NOTE — Progress Notes (Signed)
[facility]   Reason for visit:  RLQ ileostomy Patient experiencing increased edema and pouch is not sticking.  Has switched back to hollister and this is performing better Recently became dehydrated and needed IV hydration.  Increased stools due to chemotherapy treatment HPI:   Past Medical History:  Diagnosis Date   Arthritis    Endometriosis    Family history of breast cancer in mother    at age 51   GAD (generalized anxiety disorder)    lexapro helped 2017 (Dr. Hoy Finlay did have extra stress of the Master's degree program on her at that time.  Got counseling.       GERD (gastroesophageal reflux disease)    Gestational diabetes    Kidney stones    Low back pain    Dr. Charlann Boxer.   Neck pain    and left shoulder pain: managed by osteopathic manipulation by Dr. Charlann Boxer. ?cervical radiculopathy   PAC (premature atrial contraction)    Family History  Problem Relation Age of Onset   Rheum arthritis Mother    Stroke Mother    Hypertension Mother    Heart disease Mother    Arthritis Mother    Breast cancer Mother    Heart attack Mother 80   Diabetes Father    Heart disease Father    Hyperlipidemia Father    Hypertension Father    Heart attack Father 46   Heart disease Maternal Grandmother    Heart attack Maternal Grandmother 88   Arthritis Maternal Grandmother    Early death Maternal Grandfather        MVA   Diabetes Paternal Grandmother    Hypertension Paternal Grandmother    Heart disease Paternal Grandmother    Heart attack Paternal Grandmother 67   Heart disease Paternal Grandfather    Heart attack Paternal Grandfather 92   Coronary artery disease Other    Glaucoma Other    Non-Hodgkin's lymphoma Paternal Uncle    Allergies  Allergen Reactions   Latex Other (See Comments)    Sensitivity only   Sulfa Antibiotics Hives and Other (See Comments)   Sulfamethoxazole-Trimethoprim Rash   Septra [Bactrim] Rash   Current Outpatient Medications   Medication Sig Dispense Refill Last Dose   acetaminophen (TYLENOL) 650 MG CR tablet Take 650 mg by mouth every 8 (eight) hours as needed for pain.      apixaban (ELIQUIS) 2.5 MG TABS tablet Take 1 tablet (2.5 mg total) by mouth 2 (two) times daily. 60 tablet 0    cholecalciferol (VITAMIN D3) 25 MCG (1000 UNIT) tablet Take 2,000 Units by mouth daily.      dexamethasone (DECADRON) 4 MG tablet Take 2 tablets (8 mg total) by mouth daily. Start the day after last cisplatin dose on day 6 of chemo x 3 days.Take with food. 30 tablet 1    famotidine (PEPCID) 10 MG tablet Take 10 mg by mouth daily as needed for heartburn or indigestion.      furosemide (LASIX) 20 MG tablet Take 1 tablet (20 mg total) by mouth daily. 30 tablet 1    LORazepam (ATIVAN) 0.5 MG tablet Take 1 tablet (0.5 mg total) by mouth every 8 (eight) hours. (Patient taking differently: Take 0.5 mg by mouth every 8 (eight) hours as needed for anxiety.) 30 tablet 0    magic mouthwash (nystatin, lidocaine, diphenhydrAMINE, alum & mag hydroxide) suspension Swish and spit 5 mLs by mouth 4 (four) times daily for 7 days 160 mL 0  ondansetron (ZOFRAN) 8 MG tablet Take 1 tablet (8 mg total) by mouth every 8 (eight) hours as needed for nausea or vomiting. Start on the third day after last cisplatin dose on day 8 of chemo 30 tablet 1    pregabalin (LYRICA) 25 MG capsule Take 1 capsule (25 mg) by mouth at bedtime. 90 capsule 1    prochlorperazine (COMPAZINE) 10 MG tablet Take 1 tablet (10 mg total) by mouth every 6 (six) hours as needed for nausea or vomiting. 30 tablet 1    traMADol (ULTRAM) 50 MG tablet Take 1 tablet (50 mg total) by mouth every 6 (six) hours as needed for severe pain. 30 tablet 0    No current facility-administered medications for this encounter.   ROS  Review of Systems  Constitutional:  Positive for fatigue.  Gastrointestinal:        RLQ ileostomy  Skin:  Positive for rash and wound.   Vital signs:  BP (!) 84/68   Pulse  (!) 109   Temp 98.2 F (36.8 C)   Resp 16   SpO2 100%  Exam:  Physical Exam Vitals reviewed.  Constitutional:      Appearance: Normal appearance.  Abdominal:     Palpations: Abdomen is soft.     Comments: Stoma in close proximity to umbilicus  Skin:    General: Skin is warm and dry.     Findings: Rash present.  Neurological:     Mental Status: She is alert and oriented to person, place, and time.  Psychiatric:        Mood and Affect: Mood normal.        Behavior: Behavior normal.     Stoma type/location:  RLQ ileostomy Stomal assessment/size:  1", this is smaller than usual, she reports.   Peristomal assessment:  Skin is denuded again circumferentially.  This had resolved and now returned due to leaks, increased stoma output Treatment options for stomal/peristomal skin: stoma powder and skin prep  barrier ring 1 pc convex pouch Output: liquid green stool Ostomy pouching: 1pc. Hollister convex with a barrier ring Education provided:  will update [facility] with  new supply requests.     Impression/dx  Contact dermatitis Ileostomy dehydration Discussion  See back as needed Plan  Assist with supply change    Visit time: 45 minutes.   Domenic Moras FNP-BC

## 2022-04-14 ENCOUNTER — Encounter: Payer: Self-pay | Admitting: Genetic Counselor

## 2022-04-14 ENCOUNTER — Inpatient Hospital Stay (HOSPITAL_BASED_OUTPATIENT_CLINIC_OR_DEPARTMENT_OTHER): Payer: 59 | Admitting: Genetic Counselor

## 2022-04-14 DIAGNOSIS — Z807 Family history of other malignant neoplasms of lymphoid, hematopoietic and related tissues: Secondary | ICD-10-CM | POA: Diagnosis not present

## 2022-04-14 DIAGNOSIS — C569 Malignant neoplasm of unspecified ovary: Secondary | ICD-10-CM

## 2022-04-14 DIAGNOSIS — Z803 Family history of malignant neoplasm of breast: Secondary | ICD-10-CM | POA: Insufficient documentation

## 2022-04-14 NOTE — Progress Notes (Signed)
REFERRING PROVIDER: Chesley Noon, MD [place],  [place] [id number]  PRIMARY PROVIDER:  Chesley Noon, MD  PRIMARY REASON FOR VISIT:  1. Family history of breast cancer   2. Malignant germ cell tumor of ovary, unspecified laterality ([place])      HISTORY OF PRESENT ILLNESS:   [name], a 52 y.o. female, was seen for a [place] cancer genetics consultation at the request of Dr. Alvy Bimler due to a personal and family history of cancer.  [name] presents to clinic today to discuss the possibility of a hereditary predisposition to cancer, genetic testing, and to further clarify her future cancer risks, as well as potential cancer risks for family members.   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 The treatment plan included surgery and chemotherapy.      CANCER HISTORY:  Oncology History  Malignant germ cell tumor of ovary ([place])  12/01/2021 Tumor Marker   CA-125 is elevated at 285, [place] [address], beta-hCG is elevated at 116   12/02/2021 Imaging   MRI pelvis  1. 21.1 by 10.9 by 18.7 cm abdominopelvic mass, with a satellite mass in the right upper quadrant anteriorly, and moderate ascites with tumor deposits along the pelvic ascites posteriorly. The mass could be arising from right or left ovary, or both. The mass displaces the bowel and IVC, but there is no IVC thrombus or pelvic DVT identified. 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 2. Additional satellite mass above the liver is partially included, along with tumor deposits along the ascites. 3. Low-level edema along the lateral abdominal wall musculature. 4. Flattening of the IVC at the level of the aortic bifurcation due to the large abdominopelvic tumor. No findings of pelvic DVT.     12/07/2021 Imaging   CT abdomen and pelvis 1. Large central abdominal and pelvic mass  consistent with known malignancy. This is incompletely visualized by this CT of the abdomen, but was seen on recent pelvic MRI. 2. 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 Right omental implant consistent with metastatic disease. Small amount of abdominal ascites. 3. No evidence for bowel or ureteral obstruction. 4. Aortic Atherosclerosis (ICD10-I70.0). 5. Chest findings dictated separately.   12/07/2021 Imaging   CT chest 1. Negative for pulmonary embolism. And no acute or metastatic process identified in the Chest. 2. Partially visible liver mass and ascites, staging CT Abdomen today is reported separately.     12/07/2021 Procedure   Successful placement of a right internal jugular approach power injectable Port-A-Cath. The catheter is ready for immediate use.   12/07/2021 Procedure   Ultrasound-guided biopsy of right upper quadrant mass.    12/08/2021 Initial Diagnosis   Ovarian cancer ([place])   12/08/2021 Cancer Staging   Staging form: Ovary, Fallopian Tube, and Primary Peritoneal Carcinoma, AJCC 8th Edition - Clinical stage from 12/08/2021: FIGO Stage IIIC (cT3c, cN0, cM0) - Signed by Heath Lark, MD on 12/10/2021 Stage prefix: Initial diagnosis   12/20/2021 - 12/28/2021 Chemotherapy   Patient is on Treatment Plan : Ovarian germ cell tumor q21d x 3 Cycles     01/03/2022 Imaging   IMPRESSION: 1. Large central abdominopelvic mass compatible with malignancy, 3450 cubic cm in volume. This may be arising from the ovaries with slight eccentricity to the right in the pelvis. 2.  Mild increase in size of the right omental tumor deposit. 3. Mild increase in size of the subcapsular mass along the dome of the right hepatic lobe, with suspected hepatic invasion. 4. Scattered malignant ascites. 5. No dilated bowel to suggest obstruction. 6. 2 mm left kidney lower pole nonobstructive renal calculus. 7. Mild  abdominal aortic atherosclerotic vascular disease.   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  Postoperative Diagnosis: Status post R0 cytoreductive surgery and heating intraperitoneal chemotherapy  Procedures: LAPAROSCOPY, ABDOMEN, PERITONEUM, & OMENTUM, DIAGNOSTIC, W/WO COLLECTION SPECIMEN(S) BY BRUSHING OR WASHING  EXCISION/DESTRUCTION, OPEN, INTRA-ABD TUMOR/CYST/ENDOMETRIOMA, 1+ PERITONEAL/MESENTERI/RETROPERIT 5CM OR <COLECTOMY, PARTIAL; WITH ANASTOMOSIS COLOSTOMY OR SKIN LEVEL CECOSTOMY SPLENECTOMY; TOT (SEPART PROC) EXPLORATORY LAPAROTOMY, EXPLORATORY CELIOTOMY WITH OR WITHOUT BIOPSY(S) HYPERTHERMIA, EXTERNALLY GENERATED; DEEP CHEMOTHERAPY ADMINISTRATION INTO THE PERITONEAL CAVITY VIA INDWELLING PORT OR CATHETER RESECTION (INITIAL) OVARIAN, TUBAL/PRIM PERITONEAL MALIG W/BIL S&O/OMENTECT; W/RAD DISSECTION FOR DEBULKING CHOLECYSTECTOMY APPENDECTOMY OMENTECTOMY, EPIPLOECTOMY, RESECTION OF OMENTUM Procedures: Exploratory laparotomy, total abdominal hysterectomy with radical dissection, bilateral ureterolysis, bilateral salpingo-oophorectomy, posterior cul-de-sac peritonectomy, tumor debulking by Dr Berline Lopes with Gynecology Oncology Dr Dayton Scrape with surgical oncology: Diagnostic laparoscopy, exploratory laparotomy, low anterior resection with primary end-to-end anastomosis, enterolysis, cholecystectomy, tumor capsule tumor debulking, splenectomy, appendectomy, omentectomy, diverting loop ileostomy formation  Surgeon: Jeral Pinch, MD  Findings: On diagnostic laparoscopy, only able to clearly visualize the left upper quadrant. Adhesions of bowel in the right upper quadrant limiting survey, minimal ascites noted in the pelvis, scattered tumor plaques visualized on left diaphragm. Given these findings, the decision was made to convert to exploratory laparotomy. Multiple loops of small bowel encased in a Cancn  like structure in the midline with multiple filmy adhesions tethering to the superior aspect of the a large pelvic mass. Multicystic and necrotic pelvic mass originating from left ovary, approximately 18 x 20 cm incorporated within the left broad ligament with the left tube draped overlying the left ovarian mass. Bilateral tubes normal in appearance, normal-appearing uterus with smooth serosa. Right adnexa similarly with approximate 10 x 8 multicystic and necrotic mass. Diffuse tumor plaques encompassing posterior cul-de-sac peritoneum. Rectosigmoid densely adhered to posterior aspect of left adnexal mass requiring dissection and ultimately resection. Scattered treated subcentimeter nodules along small bowel mesentery. 5 x 5 cm necrotic and multicystic tumor at the level of the hepatic flexure. Enlarged similar multicystic necrotic tumor adherent between the right diaphragm and the liver without any evidence of liver invasion and tumor encased solely within the liver capsule. Multiple tumor nodules along the spleen requiring splenectomy. Omentum overall fairly normal in appearance with only scattered small tumor deposits, no omental cake was encountered. Please see surgical oncology operative note for further details regarding operative findings for their portion of the procedure.  Specimens: ID Type Source Tests Collected by Time Destination 1 : Abdominal fat pad Tissue Abdomen SURGICAL PATHOLOGY EXAM Marthann Schiller, MD 02/15/2022 [phone] 2 : Left adnexa Tissue Abdomen SURGICAL PATHOLOGY EXAM Marthann Schiller, MD 02/15/2022 [phone] : right adnexa Tissue Abdomen SURGICAL PATHOLOGY EXAM Marthann Schiller, MD 02/15/2022 [phone] : Left perimetrium Tissue Uterus SURGICAL PATHOLOGY EXAM Lafonda Mosses, MD 02/15/2022 [phone] 5 : Uterus and cervix Tissue Uterus SURGICAL PATHOLOGY EXAM Marthann Schiller, MD 02/15/2022 1018 6 : Low Anterior Resection Tissue Abdomen SURGICAL PATHOLOGY EXAM Marthann Schiller, MD 02/15/2022 1055 7 : Right peritoneum Tissue Abdomen SURGICAL PATHOLOGY EXAM Marthann Schiller, MD 02/15/2022 1058 8 :  Right retroperitoneal mass Tissue Abdomen SURGICAL PATHOLOGY EXAM Marthann Schiller, MD 02/15/2022 1101 9 : Omentum Tissue Omentum SURGICAL PATHOLOGY EXAM Marthann Schiller, MD 02/15/2022 1130 10 : Gallbladder Tissue Gallbladder SURGICAL PATHOLOGY EXAM Marthann Schiller, MD 02/15/2022 1143 11 : Right diaphragm Tissue Diaphragm SURGICAL PATHOLOGY EXAM Marthann Schiller, MD 02/15/2022 1143 12 : Left diaphragm nodule Tissue Diaphragm SURGICAL PATHOLOGY EXAM Marthann Schiller, MD 02/15/2022 1157 13 : Spleen Tissue Spleen SURGICAL PATHOLOGY EXAM Marthann Schiller, MD 02/15/2022 1204 14 : Appendix Tissue Appendix SURGICAL PATHOLOGY EXAM Marthann Schiller, MD 02/15/2022 1213 15 : Small bowel mesenteric nodules Tissue Small Bowel SURGICAL PATHOLOGY EXAM Marthann Schiller, MD 02/15/2022 1215 16 : Colon mesenteric nodule Tissue Colon SURGICAL PATHOLOGY EXAM Marthann Schiller, MD 02/15/2022 1221 17 : Hepatic flexure mass Tissue Liver SURGICAL PATHOLOGY EXAM Marthann Schiller, MD [id number]  Complications: None  Indications for Procedure: [name] is a 52 y.o. 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 Prior to the procedure, all risks, benefits, and alternatives were discussed and informed surgical consent was signed.  Procedure: Patient was taken to the operating room where general anesthesia was achieved. She was positioned in dorsal lithotomy and prepped and draped per the surgical oncology team. A foley catheter was inserted into the bladder. A 5 mm skin incision was made in the left upper quadrant at Palmer's point, the abdomen was then entered with direct Optiview entry and the abdomen was insufflated. Findings were  noted as above.  The laparoscope was then removed from the abdomen, the pneumoperitoneum was maintained. A vertical midline incision was made with the scalpel and the abdomen was entered sharply. The abdomen and pelvis were surveyed with findings as documented above. A wound protector was placed followed by the [facility] retractor.  The enlarged left adnexal mass was then carefully removed of its bilateral sidewall attachments using a mixture of blunt and cautery dissection. Multiple small bowel loops were adhered to the superior and posterior aspect of the mass with filmy adhesions which were carefully bluntly resected. The rectosigmoid was noted to be densely adhered to the posterior aspect of the pelvic mass. Dissection was attempted using mixture of blunt dissection as well as sharp dissection with the Metzenbaum scissors, and unavoidable enterotomy of approximately 4 cm was made to the anterior surface of the rectosigmoid. This enterotomy was then closed with 4-0 PDS in a running fashion to minimize contamination. Further dissection removing the remainder of the rectosigmoid from the posterior aspect of the pelvic mass was then undertaken. The pelvic mass was then able to be fully elevated from the pelvis. The left infundibulopelvic ligament was able to be isolated with the left ureter clearly visualized below. The left ureter was further dissected away from the medial leaf of the broad ligament, and a vessel loop was placed. The left infundibulopelvic ligament was then isolated, clamped, and transected using the LigaSure device as well as and suture-ligated. The left utero-ovarian ligament was then isolated, clamped, cauterized and transected. The left pelvic mass was then removed entirely.  Attention was then turned to the right. An additional enlarged multicystic right adnexal mass was noted, this was carefully elevated from the pelvis using blunt dissection. The right round ligament was then grasped,  elevated, and transected. The broad ligament was then opened posteriorly. The ureter was identified and the right infundibulopelvic ligament was then isolated, clamped,  transected, and tied. The right utero ovarian ligament was then isolated, clamped, and transected using the LigaSure device and the right adnexal mass was removed.  Attention then was turned toward the hysterectomy. A sponge stick was placed within the vagina. The right broad ligament dissection was opened anteriorly and the bladder flap was developed. The left round ligament was then further transected, and the broad ligament opened posteriorly and anteriorly to complete the bladder flap. The right uterine artery was skeletonized, clamped, transected, and suture-ligated. There was significant parametrial tumor involvement on the left side, and therefore a radical dissection was required to isolate the blood supply. The left ureter was further dissected down to the level near the trigone. The left uterine artery was traced to its origin, isolated, and clamped with vascular clips. Similarly the left uterine vein due to significant necrotic tumor involvement was friable. Detachment was similarly carefully isolated, and clipped with vascular clips. Sequential clamps were then used to transect the remainder of the broad and cardinal ligaments bilaterally, with each pedicle being suture-ligated. A large right angle clamp was then able to be placed below the cervix, and the uterus and cervix were amputated from the vagina. The vaginal cuff was then closed with several figure-of-eight sutures using 0 Vicryl.    02/15/2022 Pathology Results   A: Abdominal fat pad, excision - Fibroadipose tissue with residual mixed malignant germ cell tumor with treatment effect   B: Left adnexa, salpingo-oophorectomy - Residual mixed malignant germ cell tumor with treatment effect, [id number] (See Comment and Synoptic report) - FIGO stage IIIC - Fallopian tubes with  no tumor present   C: Right adnexa, salpingo-oophorectomy - Residual mixed malignant germ cell tumor with treatment effect (See Synoptic report) - Fallopian tubes not identified   D: Left parametrium, biopsy - Fibroadipose tissue with residual mixed malignant germ cell tumor with treatment effect   E: Uterus and cervix, hysterectomy - Right adnexal soft tissue with residual mixed malignant germ cell tumor with treatment effect   Myometrium: - Adenomyosis - Leiomyomata measuring up to 0.7 cm - Extensive chronic serositis   Cervix:  - Ectocervix and endocervix with chronic cervicitis    Endometrium: - Inactive endometrium   F: Low anterior resection - Residual mixed malignant  germ cell tumor with treatment effect involving colonic serosa and pericolonic adipose tissue - Proximal and distal colonic resection margins with chronic serositis, but negative for tumor    G: Right peritoneum, biopsy - Fibroadipose tissue with residual mixed malignant germ cell tumor with treatment effect   H: Right retroperitoneal mass, excision - Fibroadipose tissue with residual mixed malignant germ cell tumor with treatment effect   I: Omentum, omentectomy - Fibroadipose tissue with residual mixed malignant germ cell tumor with treatment effect   J: Gallbladder, cholecystectomy - Gallbladder with mild chronic serositis and fibrous adhesion   K: Right diaphragm, excision - Fibrous tissue with residual mixed malignant germ cell tumor with treatment effect - Hepatic tissue with no tumor present   L: Left diaphragm nodule, biopsy - Fibrous tissue with residual mixed malignant germ cell tumor with treatment effect   M: Spleen, splenectomy - Residual mixed malignant germ cell tumor with treatment effect involving splenic capsule   N: Appendix, appendectomy - Appendix with fibrous obliteration at the tip - Periappendiceal adipose tissue with acute and chronic inflammation   O: Small bowel  mesenteric nodules, excision - Fibroadipose tissue with residual mixed malignant germ cell tumor with treatment effect   P: Mesenteric nodules, excision -  Fibroadipose tissue with residual mixed malignant germ cell tumor with treatment effect   Q: Hepatic flexure mass, excision - Fibroadipose tissue with residual mixed malignant germ cell tumor with treatment effect   R: Anvil and donut, biopsy - Benign segment of colon with acute and chronic inflammation and fibrous adhesion  SPECIMEN    Procedure:    Radical hysterectomy     Procedure:    Omentectomy     Procedure:    Peritoneal tumor debulking     Hysterectomy Type:    Abdominal     Specimen Integrity:          Right Ovary Integrity:    Capsule intact     Specimen Integrity:          Left Ovary Integrity:    Capsule intact   TUMOR    Tumor Site:    Bilateral ovaries     Tumor Size:    Greatest Dimension (Centimeters): 19.5 cm     Histologic Type:    Mixed malignant germ cell tumor:  yolk sac tumor, mature teratoma, mature neural elements, immature neural tissue, and immature cartilage     Ovarian Surface Involvement:    Present, right and left     Other Tissue / Organ Involvement:    Right ovary     Other Tissue / Organ Involvement:    Left ovary     Other Tissue / Organ Involvement:    Right fallopian tube     Other Tissue / Organ Involvement:    Omentum     Other Tissue / Organ Involvement:    Diaphragm and splenic capsule     Largest Extrapelvic Peritoneal Focus:    Macroscopic (greater than 2 cm): Splenic capsule     Peritoneal / Ascitic Fluid Involvement:    Not submitted / unknown   REGIONAL LYMPH NODES     Regional Lymph Node Status:    Not applicable (no regional lymph nodes submitted or found)   DISTANT METASTASIS     Distant Site(s) Involved:    Diaphragm, splenic capsule   pTNM CLASSIFICATION (AJCC 8th Edition)     Reporting of pT, pN, and (when applicable) pM categories is based on information available to the  pathologist at the time the report is issued. As per the AJCC (Chapter 1, 8th Ed.) it is the managing physician's responsibility to establish the final pathologic stage based upon all pertinent information, including but potentially not limited to this pathology report.     Modified Classification:    y     pT Category:    pT3c     pN Category:    pN not assigned (no nodes submitted or found)     pM Category:    pM1   FIGO STAGE     FIGO Stage:    IIIC   COMMENT:  The needle core biopsies show a heterogeneous combination of elements including abundant neural tissue with atypical and immature features. There are also atypical glandular epithelial structures and focal yolk sac elements with microcystic pattern.  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  Immunohistochemistry is performed for cytokeratin AE1/AE3, CD117, CD30, G FAP, glypican-3, OCT3/4, synaptophysin, S100 and CD30.     03/14/2022 -  Chemotherapy   Patient is on Treatment Plan : Malignant germ cell tumor of ovary D1-5 q21d     03/22/2022 Tumor Marker   Patient's tumor was tested  for the following markers: AFP. Results of the tumor marker test revealed 7.9.      RISK FACTORS:  Menarche was at age 26.  First live birth at age 76.  OCP use for approximately 4 years.  Ovaries intact: no.  Hysterectomy: yes.  Menopausal status: postmenopausal.  HRT use: 0 years. Colonoscopy: no; not examined. Mammogram within the last year: yes. Number of breast biopsies: 0. Up to date with pelvic exams: yes. Any excessive radiation exposure in the past: no  Past Medical History:  Diagnosis Date   Arthritis    Endometriosis    Family history of breast cancer    Family history of breast cancer in mother    at age 48   GAD (generalized anxiety disorder)    lexapro helped 2017 (Dr. Hoy Finlay did have extra stress of the Master's degree program on her  at that time.  Got counseling.       GERD (gastroesophageal reflux disease)    Gestational diabetes    Kidney stones    Low back pain    Dr. Charlann Boxer.   Neck pain    and left shoulder pain: managed by osteopathic manipulation by Dr. Charlann Boxer. ?cervical radiculopathy   PAC (premature atrial contraction)     Past Surgical History:  Procedure Laterality Date   ABDOMINAL EXPLORATION SURGERY  2009   fulguration of endometriosis - [facility] surgery   IR IMAGING GUIDED PORT INSERTION  12/08/2021   IR US GUIDE BX ASP/DRAIN  12/08/2021   SALPINGECTOMY Right 2009   TONSILLECTOMY     TUBAL LIGATION Left 2009    Social History   Socioeconomic History   Marital status: Married    Spouse name: Not on file   Number of children: 3   Years of education: Not on file   Highest education level: Not on file  Occupational History   Occupation: nurse    Employer: [employer]    Comment: at [facility] [facility]   Tobacco Use   Smoking status: Never   Smokeless tobacco: Never  Vaping Use   Vaping Use: Never used  Substance and Sexual Activity   Alcohol use: Yes    Comment: Occasionally   Drug use: No   Sexual activity: Yes    Partners: Male    Birth control/protection: Surgical  Other Topics Concern   Not on file  Social History Narrative   Married, 3 kids.    Lives in [place].   Educ: Masters deg nursing.   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   No tob/drugs.   Alc: social.   Social Determinants of Health   Financial Resource Strain: Not on file  Food Insecurity: No Food Insecurity (01/05/2022)   Hunger Vital Sign    Worried About Running Out of Food in the Last Year: Never true    Ran Out of Food in the Last Year: Never true  Transportation Needs: No Transportation Needs (01/05/2022)   PRAPARE - Hydrologist (Medical): No    Lack of Transportation (Non-Medical): No  Physical  Activity: Not on file  Stress: Not on file  Social Connections: Not on file     FAMILY HISTORY:  We obtained a detailed, 4-generation family history.  Significant diagnoses are listed below: Family History  Problem Relation Age of Onset   Rheum arthritis Mother    Stroke Mother  Hypertension Mother    Heart disease Mother    Arthritis Mother    Breast cancer Mother 55   Heart attack Mother 79   Diabetes Father    Heart disease Father    Hyperlipidemia Father    Hypertension Father    Heart attack Father 56   Non-Hodgkin's lymphoma Paternal Uncle 26   Heart disease Maternal Grandmother    Heart attack Maternal Grandmother 88   Arthritis Maternal Grandmother    Early death Maternal Grandfather 30       MVA   Diabetes Paternal Grandmother    Hypertension Paternal Grandmother    Heart disease Paternal Grandmother    Heart attack Paternal Grandmother 86   Heart disease Paternal Grandfather    Heart attack Paternal Grandfather 92   Coronary artery disease Other    Glaucoma Other      The patient has three children who are cancer free.  She has one brother who is cancer free.  Both parents are living.  The patient's mother had breast cancer at 60.  She has two sisters who are cancer free.  Her parents died of non-cancer related issues.  The patient's father is living at 29.  He has a sister and brother.  The brother was diagnosed with lymphoma at 93.  His parents are deceased from non-cancer related issues.  [name] is unaware of previous family history of genetic testing for hereditary cancer risks. Patient's maternal ancestors are of [place] and [place] descent, and paternal ancestors are of [place] and [place] descent. There is no reported Ashkenazi Jewish ancestry. There is no known consanguinity.  GENETIC COUNSELING ASSESSMENT: [name] is a 52 y.o. female with a personal and family history of cancer which is somewhat suggestive of a hereditary cancer syndrome and  predisposition to cancer given the combination of cancer and her young age of onset. We, therefore, discussed and recommended the following at today's visit.   DISCUSSION: We discussed that, in general, most cancer is not inherited in families, but instead is sporadic or familial. Sporadic cancers occur by chance and typically happen at older ages (>50 years) as this type of cancer is caused by genetic changes acquired during an individual's lifetime. Some families have more cancers than would be expected by chance; however, the ages or types of cancer are not consistent with a known genetic mutation or known genetic mutations have been ruled out. This type of familial cancer is thought to be due to a combination of multiple genetic, environmental, hormonal, and lifestyle factors. While this combination of factors likely increases the risk of cancer, the exact source of this risk is not currently identifiable or testable.  We discussed that up to 20% of ovarian cancer is hereditary, with most cases associated with BRCA mutations.  There are other genes that can be associated with hereditary ovarian cancer syndromes.  These include BRIP1, RAD51C, RAD51D and Lynch syndrome genes.  We discussed that testing is beneficial for several reasons including knowing how to follow individuals after completing their treatment, identifying whether potential treatment options such as PARP inhibitors would be beneficial, and understand if other family members could be at risk for cancer and allow them to undergo genetic testing.   We reviewed the characteristics, features and inheritance patterns of hereditary cancer syndromes. We also discussed genetic testing, including the appropriate family members to test, the process of testing, insurance coverage and turn-around-time for results. We discussed the implications of a negative, positive, carrier and/or variant  of uncertain significant result. [name]  was offered a  common hereditary cancer panel (47 genes) and an expanded pan-cancer panel (77 genes). [name] was informed of the benefits and limitations of each panel, including that expanded pan-cancer panels contain genes that do not have clear management guidelines at this point in time.  We also discussed that as the number of genes included on a panel increases, the chances of variants of uncertain significance increases. [name] decided to pursue genetic testing for the CancerNext-Expanded gene panel.   The CancerNext-Expanded gene panel offered by [facility] and includes sequencing and rearrangement analysis for the following 77 genes: AIP, ALK, APC*, ATM*, AXIN2, BAP1, BARD1, BMPR1A, BRCA1*, BRCA2*, BRIP1*, CDC73, CDH1*, CDK4, CDKN1B, CDKN2A, CHEK2*, CTNNA1, DICER1, FH, FLCN, KIF1B, LZTR1, MAX, MEN1, MET, MLH1*, MSH2*, MSH3, MSH6*, MUTYH*, NF1*, NF2, NTHL1, PALB2*, PHOX2B, PMS2*, POT1, PRKAR1A, PTCH1, PTEN*, RAD51C*, RAD51D*, RB1, RET, SDHA, SDHAF2, SDHB, SDHC, SDHD, SMAD4, SMARCA4, SMARCB1, SMARCE1, STK11, SUFU, TMEM127, TP53*, TSC1, TSC2, and VHL (sequencing and deletion/duplication); EGFR, EGLN1, HOXB13, KIT, MITF, PDGFRA, POLD1, and POLE (sequencing only); EPCAM and GREM1 (deletion/duplication only). DNA and RNA analyses performed for * genes.   Based on [name]'s personal and family history of cancer, she meets medical criteria for genetic testing. Despite that she meets criteria, she may still have an out of pocket cost. We discussed that if her out of pocket cost for testing is over $100, the laboratory will call and confirm whether she wants to proceed with testing.  If the out of pocket cost of testing is less than $100 she will be billed by the genetic testing laboratory.   We discussed that some people do not want to undergo genetic testing due to fear of genetic discrimination.  The Genetic Information Nondiscrimination Act (GINA) was signed into federal law in 2008. GINA prohibits health  insurers and most employers from discriminating against individuals based on genetic information (including the results of genetic tests and family history information). According to GINA, health insurance companies cannot consider genetic information to be a preexisting condition, nor can they use it to make decisions regarding coverage or rates. GINA also makes it illegal for most employers to use genetic information in making decisions about hiring, firing, promotion, or terms of employment. It is important to note that GINA does not offer protections for life insurance, disability insurance, or long-term care insurance. GINA does not apply to those in the [employer], those who work for companies with less than 15 employees, and new life insurance or long-term disability insurance policies.  Health status due to a cancer diagnosis is not protected under GINA. More information about GINA can be found by visiting [email or web address].   PLAN: After considering the risks, benefits, and limitations, [name] provided informed consent to pursue genetic testing and the blood sample was sent to [facility] for analysis of the CancerNext-Expanded+RNAinsight. Results should be available within approximately 2-3 weeks' time, at which point they will be disclosed by telephone to [name], as will any additional recommendations warranted by these results. [name] will receive a summary of her genetic counseling visit and a copy of her results once available. This information will also be available in Epic.   Lastly, we encouraged [name] to remain in contact with cancer genetics annually so that we can continuously update the family history and inform her of any changes in cancer genetics and testing that may be of benefit for this family.   Ms.  Palmer's questions were answered to her satisfaction today. Our contact information was provided should additional questions or concerns arise. Thank you  for the referral and allowing [place] to share in the care of your patient.   [name] P. Florene Glen, [place], [facility] Licensed, Insurance risk surveyor [email or web address] phone: [phone]  The patient was seen for a total of 35 minutes in face-to-face genetic counseling.  The patient brought her husband. Drs. Michell Heinrich, and/or Elim were available for questions, if needed..    _______________________________________________________________________ For Office Staff:  Number of people involved in session: 2 Was an Intern/ student involved with case: no

## 2022-04-15 ENCOUNTER — Inpatient Hospital Stay: Payer: 59

## 2022-04-15 ENCOUNTER — Other Ambulatory Visit: Payer: Self-pay | Admitting: Genetic Counselor

## 2022-04-15 ENCOUNTER — Inpatient Hospital Stay: Payer: 59 | Attending: Gynecologic Oncology

## 2022-04-15 DIAGNOSIS — C569 Malignant neoplasm of unspecified ovary: Secondary | ICD-10-CM | POA: Insufficient documentation

## 2022-04-15 DIAGNOSIS — Z5111 Encounter for antineoplastic chemotherapy: Secondary | ICD-10-CM | POA: Diagnosis not present

## 2022-04-15 DIAGNOSIS — Z5189 Encounter for other specified aftercare: Secondary | ICD-10-CM | POA: Diagnosis not present

## 2022-04-15 DIAGNOSIS — Z79899 Other long term (current) drug therapy: Secondary | ICD-10-CM | POA: Diagnosis not present

## 2022-04-15 LAB — CBC WITH DIFFERENTIAL/PLATELET
Abs Immature Granulocytes: 1.09 10*3/uL — ABNORMAL HIGH (ref 0.00–0.07)
Basophils Absolute: 0 10*3/uL (ref 0.0–0.1)
Basophils Relative: 0 %
Eosinophils Absolute: 0 10*3/uL (ref 0.0–0.5)
Eosinophils Relative: 0 %
HCT: 31.4 % — ABNORMAL LOW (ref 36.0–46.0)
Hemoglobin: 10.4 g/dL — ABNORMAL LOW (ref 12.0–15.0)
Immature Granulocytes: 11 %
Lymphocytes Relative: 14 %
Lymphs Abs: 1.4 10*3/uL (ref 0.7–4.0)
MCH: 30.3 pg (ref 26.0–34.0)
MCHC: 33.1 g/dL (ref 30.0–36.0)
MCV: 91.5 fL (ref 80.0–100.0)
Monocytes Absolute: 1.6 10*3/uL — ABNORMAL HIGH (ref 0.1–1.0)
Monocytes Relative: 16 %
Neutro Abs: 6 10*3/uL (ref 1.7–7.7)
Neutrophils Relative %: 59 %
Platelets: 155 10*3/uL (ref 150–400)
RBC: 3.43 MIL/uL — ABNORMAL LOW (ref 3.87–5.11)
RDW: 18.3 % — ABNORMAL HIGH (ref 11.5–15.5)
WBC: 10.1 10*3/uL (ref 4.0–10.5)
nRBC: 0 % (ref 0.0–0.2)

## 2022-04-15 LAB — GENETIC SCREENING ORDER

## 2022-04-15 MED ORDER — SODIUM CHLORIDE 0.9% FLUSH
10.0000 mL | Freq: Once | INTRAVENOUS | Status: AC
  Administered 2022-04-15: 10 mL

## 2022-04-18 ENCOUNTER — Inpatient Hospital Stay: Payer: 59

## 2022-04-18 ENCOUNTER — Ambulatory Visit (HOSPITAL_COMMUNITY): Payer: 59 | Admitting: Nurse Practitioner

## 2022-04-18 ENCOUNTER — Inpatient Hospital Stay (HOSPITAL_BASED_OUTPATIENT_CLINIC_OR_DEPARTMENT_OTHER): Payer: 59 | Admitting: Hematology and Oncology

## 2022-04-18 ENCOUNTER — Encounter: Payer: Self-pay | Admitting: Hematology and Oncology

## 2022-04-18 VITALS — BP 124/58 | HR 104 | Temp 98.8°F | Resp 18 | Ht 59.0 in | Wt 103.4 lb

## 2022-04-18 DIAGNOSIS — T451X5A Adverse effect of antineoplastic and immunosuppressive drugs, initial encounter: Secondary | ICD-10-CM | POA: Insufficient documentation

## 2022-04-18 DIAGNOSIS — R000 Tachycardia, unspecified: Secondary | ICD-10-CM

## 2022-04-18 DIAGNOSIS — C569 Malignant neoplasm of unspecified ovary: Secondary | ICD-10-CM

## 2022-04-18 DIAGNOSIS — D6481 Anemia due to antineoplastic chemotherapy: Secondary | ICD-10-CM

## 2022-04-18 DIAGNOSIS — Z5111 Encounter for antineoplastic chemotherapy: Secondary | ICD-10-CM | POA: Diagnosis not present

## 2022-04-18 LAB — CBC WITH DIFFERENTIAL/PLATELET
Abs Immature Granulocytes: 34.16 10*3/uL — ABNORMAL HIGH (ref 0.00–0.07)
Basophils Absolute: 0.1 10*3/uL (ref 0.0–0.1)
Basophils Relative: 0 %
Eosinophils Absolute: 0 10*3/uL (ref 0.0–0.5)
Eosinophils Relative: 0 %
HCT: 29.6 % — ABNORMAL LOW (ref 36.0–46.0)
Hemoglobin: 9.8 g/dL — ABNORMAL LOW (ref 12.0–15.0)
Immature Granulocytes: 59 %
Lymphocytes Relative: 7 %
Lymphs Abs: 4.1 10*3/uL — ABNORMAL HIGH (ref 0.7–4.0)
MCH: 30.8 pg (ref 26.0–34.0)
MCHC: 33.1 g/dL (ref 30.0–36.0)
MCV: 93.1 fL (ref 80.0–100.0)
Monocytes Absolute: 11.3 10*3/uL — ABNORMAL HIGH (ref 0.1–1.0)
Monocytes Relative: 19 %
Neutro Abs: 8.9 10*3/uL — ABNORMAL HIGH (ref 1.7–7.7)
Neutrophils Relative %: 15 %
Platelets: 234 10*3/uL (ref 150–400)
RBC: 3.18 MIL/uL — ABNORMAL LOW (ref 3.87–5.11)
RDW: 19.9 % — ABNORMAL HIGH (ref 11.5–15.5)
WBC: 58.5 10*3/uL (ref 4.0–10.5)
nRBC: 0.5 % — ABNORMAL HIGH (ref 0.0–0.2)

## 2022-04-18 LAB — CMP (CANCER CENTER ONLY)
ALT: 28 U/L (ref 0–44)
AST: 38 U/L (ref 15–41)
Albumin: 4.1 g/dL (ref 3.5–5.0)
Alkaline Phosphatase: 87 U/L (ref 38–126)
Anion gap: 8 (ref 5–15)
BUN: 10 mg/dL (ref 6–20)
CO2: 28 mmol/L (ref 22–32)
Calcium: 9.4 mg/dL (ref 8.9–10.3)
Chloride: 104 mmol/L (ref 98–111)
Creatinine: 0.57 mg/dL (ref 0.44–1.00)
GFR, Estimated: >60 mL/min (ref >60)
Glucose, Bld: 81 mg/dL (ref 70–99)
Potassium: 3.7 mmol/L (ref 3.5–5.1)
Sodium: 140 mmol/L (ref 135–145)
Total Bilirubin: 0.2 mg/dL — ABNORMAL LOW (ref 0.3–1.2)
Total Protein: 6.4 g/dL — ABNORMAL LOW (ref 6.5–8.1)

## 2022-04-18 LAB — SAMPLE TO BLOOD BANK

## 2022-04-18 LAB — MAGNESIUM: Magnesium: 1.8 mg/dL (ref 1.7–2.4)

## 2022-04-18 LAB — TSH: TSH: 1.497 u[IU]/mL (ref 0.350–4.500)

## 2022-04-18 MED ORDER — SODIUM CHLORIDE 0.9% FLUSH
10.0000 mL | Freq: Once | INTRAVENOUS | Status: AC
  Administered 2022-04-18: 10 mL

## 2022-04-18 NOTE — Assessment & Plan Note (Signed)
She is mildly anemic but not symptomatic Will proceed with treatment next week without delay

## 2022-04-18 NOTE — Assessment & Plan Note (Signed)
Overall, she tolerated treatment well with expected side-effects Her elevated white blood cell count is due to G-CSF support She does not need transfusion support We discussed the risk and benefits of continuing 1 more dose of chemotherapy and ultimately, she would like to complete cycle 3 of chemotherapy as scheduled next week She is scheduled for CT imaging next month She is scheduled for booster injection for meningococcal vaccine next month

## 2022-04-18 NOTE — Progress Notes (Signed)
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[place]) Overall, she tolerated treatment well with expected side-effects Her elevated white blood cell count is due to G-CSF support She does not need transfusion support We discussed the risk and benefits of continuing 1 more dose of chemotherapy and ultimately, she would like to complete cycle 3 of chemotherapy as scheduled next week She is scheduled for CT imaging next month She is scheduled for booster injection for meningococcal vaccine next month  Anemia due to antineoplastic chemotherapy She is mildly anemic but not symptomatic Will proceed with treatment next week without delay  Orders Placed This Encounter  Procedures   Magnesium    Standing Status:   Future    Number of Occurrences:   1    Standing Expiration Date:   04/18/2023   CMP ([place] only)    Standing Status:   Future    Number of Occurrences:   1    Standing Expiration Date:   04/18/2023   TSH    Standing Status:   Future    Number of Occurrences:   1    Standing Expiration Date:   04/18/2023    All questions were answered. The patient knows to call the clinic with any problems, questions or concerns. The total time spent in the appointment was 25 minutes encounter with patients including review of chart and various tests results, discussions about plan of care and coordination of care plan   Heath Lark, MD 04/18/2022 12:19 PM  INTERVAL HISTORY: Please see below for problem oriented charting. she returns for treatment follow-up with her husband She tolerated last cycle of therapy fairly well except for brief episode of dehydration and bone  pain secondary to G-CSF support No recent infection, fever or chills We discussed the pros and cons of continuing with the plan for 1 more cycle of therapy  REVIEW OF SYSTEMS:   Constitutional: Denies fevers, chills or abnormal weight loss Eyes: Denies blurriness of vision Ears, nose, mouth, throat, and face: Denies mucositis or sore throat Respiratory: Denies cough, dyspnea or wheezes Cardiovascular: Denies palpitation, chest discomfort or lower extremity swelling Gastrointestinal:  Denies nausea, heartburn or change in bowel habits Skin: Denies abnormal skin rashes Lymphatics: Denies new lymphadenopathy or easy bruising Neurological:Denies numbness, tingling or new weaknesses Behavioral/Psych: Mood is stable, no new changes  All other systems were reviewed with the patient and are negative.  I have reviewed the past medical history, past surgical history, social history and family history with the patient and they are unchanged from previous note.  ALLERGIES:  is allergic to latex, sulfa antibiotics, sulfamethoxazole-trimethoprim, and septra [bactrim].  MEDICATIONS:  Current Outpatient Medications  Medication Sig Dispense Refill   acetaminophen (TYLENOL) 650 MG CR tablet Take 650 mg by mouth every 8 (eight) hours as needed for pain.     apixaban (ELIQUIS) 2.5 MG TABS tablet Take 1 tablet (2.5 mg total) by mouth 2 (two) times daily. 60 tablet 0   cholecalciferol (VITAMIN D3) 25 MCG (1000 UNIT) tablet Take 2,000 Units by mouth daily.     dexamethasone (DECADRON) 4 MG tablet Take 2 tablets (8 mg total) by mouth daily. Start the day after last cisplatin  dose on day 6 of chemo x 3 days.Take with food. 30 tablet 1   famotidine (PEPCID) 10 MG tablet Take 10 mg by mouth daily as needed for heartburn or indigestion.     furosemide (LASIX) 20 MG tablet Take 1 tablet (20 mg total) by mouth daily. 30 tablet 1   LORazepam (ATIVAN) 0.5 MG tablet Take 1 tablet (0.5 mg total) by mouth every 8 (eight)  hours. (Patient taking differently: Take 0.5 mg by mouth every 8 (eight) hours as needed for anxiety.) 30 tablet 0   magic mouthwash (nystatin, lidocaine, diphenhydrAMINE, alum & mag hydroxide) suspension Swish and spit 5 mLs by mouth 4 (four) times daily for 7 days 160 mL 0   ondansetron (ZOFRAN) 8 MG tablet Take 1 tablet (8 mg total) by mouth every 8 (eight) hours as needed for nausea or vomiting. Start on the third day after last cisplatin dose on day 8 of chemo 30 tablet 1   pregabalin (LYRICA) 25 MG capsule Take 1 capsule (25 mg) by mouth at bedtime. 90 capsule 1   prochlorperazine (COMPAZINE) 10 MG tablet Take 1 tablet (10 mg total) by mouth every 6 (six) hours as needed for nausea or vomiting. 30 tablet 1   traMADol (ULTRAM) 50 MG tablet Take 1 tablet (50 mg total) by mouth every 6 (six) hours as needed for severe pain. 30 tablet 0   No current facility-administered medications for this visit.    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 21.1 by 10.9 by 18.7 cm abdominopelvic mass, with a satellite mass in the right upper quadrant anteriorly, and moderate ascites with tumor deposits along the pelvic ascites posteriorly. The mass could be arising from right or left ovary, or both. The mass displaces the bowel and IVC, but there is no IVC thrombus or pelvic DVT identified. 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 2. Additional satellite mass above the liver is partially included, along with tumor deposits along the ascites. 3. Low-level edema along the lateral abdominal wall musculature. 4. Flattening of the IVC at the level of the aortic bifurcation due to the large abdominopelvic tumor. No findings of pelvic DVT.     12/07/2021 Imaging   CT  abdomen and pelvis 1. Large central abdominal and pelvic mass consistent with known malignancy. This is incompletely visualized by this CT of the abdomen, but was seen on recent pelvic MRI. 2. 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 Right omental implant consistent with metastatic disease. Small amount of abdominal ascites. 3. No evidence for bowel or ureteral obstruction. 4. Aortic Atherosclerosis (ICD10-I70.0). 5. Chest findings dictated separately.   12/07/2021 Imaging   CT chest 1. Negative for pulmonary embolism. And no acute or metastatic process identified in the Chest. 2. Partially visible liver mass and ascites, staging CT Abdomen today is reported separately.     12/07/2021 Procedure   Successful placement of a right internal jugular approach power injectable Port-A-Cath. The catheter is ready for immediate use.   12/07/2021 Procedure   Ultrasound-guided biopsy of right upper quadrant mass.    12/08/2021 Initial Diagnosis   Ovarian cancer ([place])   12/08/2021 Cancer Staging   Staging form: Ovary, Fallopian Tube,  and Primary Peritoneal Carcinoma, AJCC 8th Edition - Clinical stage from 12/08/2021: FIGO Stage IIIC (cT3c, cN0, cM0) - Signed by Heath Lark, MD on 12/10/2021 Stage prefix: Initial diagnosis   12/20/2021 - 12/28/2021 Chemotherapy   Patient is on Treatment Plan : Ovarian germ cell tumor q21d x 3 Cycles     01/03/2022 Imaging   IMPRESSION: 1. Large central abdominopelvic mass compatible with malignancy, 3450 cubic cm in volume. This may be arising from the ovaries with slight eccentricity to the right in the pelvis. 2. Mild increase in size of the right omental tumor deposit. 3. Mild increase in size of the subcapsular mass along the dome of the right hepatic lobe, with suspected hepatic invasion. 4. Scattered malignant ascites. 5. No dilated bowel to suggest obstruction. 6. 2 mm  left kidney lower pole nonobstructive renal calculus. 7. Mild abdominal aortic atherosclerotic vascular disease.   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  Postoperative Diagnosis: Status post R0 cytoreductive surgery and heating intraperitoneal chemotherapy  Procedures: LAPAROSCOPY, ABDOMEN, PERITONEUM, & OMENTUM, DIAGNOSTIC, W/WO COLLECTION SPECIMEN(S) BY BRUSHING OR WASHING  EXCISION/DESTRUCTION, OPEN, INTRA-ABD TUMOR/CYST/ENDOMETRIOMA, 1+ PERITONEAL/MESENTERI/RETROPERIT 5CM OR <COLECTOMY, PARTIAL; WITH ANASTOMOSIS COLOSTOMY OR SKIN LEVEL CECOSTOMY SPLENECTOMY; TOT (SEPART PROC) EXPLORATORY LAPAROTOMY, EXPLORATORY CELIOTOMY WITH OR WITHOUT BIOPSY(S) HYPERTHERMIA, EXTERNALLY GENERATED; DEEP CHEMOTHERAPY ADMINISTRATION INTO THE PERITONEAL CAVITY VIA INDWELLING PORT OR CATHETER RESECTION (INITIAL) OVARIAN, TUBAL/PRIM PERITONEAL MALIG W/BIL S&O/OMENTECT; W/RAD DISSECTION FOR DEBULKING CHOLECYSTECTOMY APPENDECTOMY OMENTECTOMY, EPIPLOECTOMY, RESECTION OF OMENTUM Procedures: Exploratory laparotomy, total abdominal hysterectomy with radical dissection, bilateral ureterolysis, bilateral salpingo-oophorectomy, posterior cul-de-sac peritonectomy, tumor debulking by Dr Berline Lopes with Gynecology Oncology Dr [place] Scrape with surgical oncology: Diagnostic laparoscopy, exploratory laparotomy, low anterior resection with primary end-to-end anastomosis, enterolysis, cholecystectomy, tumor capsule tumor debulking, splenectomy, appendectomy, omentectomy, diverting loop ileostomy formation  Surgeon: Jeral Pinch, MD  Findings: On diagnostic laparoscopy, only able to clearly visualize the left upper quadrant. Adhesions of bowel in the right upper quadrant limiting survey, minimal ascites noted in the pelvis, scattered tumor plaques visualized on left diaphragm. Given these findings, the decision was made to convert to exploratory  laparotomy. Multiple loops of small bowel encased in a Cancn like structure in the midline with multiple filmy adhesions tethering to the superior aspect of the a large pelvic mass. Multicystic and necrotic pelvic mass originating from left ovary, approximately 18 x 20 cm incorporated within the left broad ligament with the left tube draped overlying the left ovarian mass. Bilateral tubes normal in appearance, normal-appearing uterus with smooth serosa. Right adnexa similarly with approximate 10 x 8 multicystic and necrotic mass. Diffuse tumor plaques encompassing posterior cul-de-sac peritoneum. Rectosigmoid densely adhered to posterior aspect of left adnexal mass requiring dissection and ultimately resection. Scattered treated subcentimeter nodules along small bowel mesentery. 5 x 5 cm necrotic and multicystic tumor at the level of the hepatic flexure. Enlarged similar multicystic necrotic tumor adherent between the right diaphragm and the liver without any evidence of liver invasion and tumor encased solely within the liver capsule. Multiple tumor nodules along the spleen requiring splenectomy. Omentum overall fairly normal in appearance with only scattered small tumor deposits, no omental cake was encountered. Please see surgical oncology operative note for further details regarding operative findings for their portion of the procedure.  Specimens: ID Type Source Tests Collected by Time Destination 1 : Abdominal fat pad Tissue Abdomen SURGICAL PATHOLOGY EXAM Marthann Schiller, MD 02/15/2022 [phone] 2 : Left adnexa  Tissue Abdomen SURGICAL PATHOLOGY EXAM Marthann Schiller, MD 02/15/2022 [phone] : right adnexa Tissue Abdomen SURGICAL PATHOLOGY EXAM Marthann Schiller, MD 02/15/2022 [phone] : Left perimetrium Tissue Uterus SURGICAL PATHOLOGY EXAM Lafonda Mosses, MD 02/15/2022 [phone] : Uterus and cervix Tissue Uterus SURGICAL PATHOLOGY EXAM Marthann Schiller, MD 02/15/2022 1018 6 : Low  Anterior Resection Tissue Abdomen SURGICAL PATHOLOGY EXAM Marthann Schiller, MD 02/15/2022 1055 7 : Right peritoneum Tissue Abdomen SURGICAL PATHOLOGY EXAM Marthann Schiller, MD 02/15/2022 1058 8 : Right retroperitoneal mass Tissue Abdomen SURGICAL PATHOLOGY EXAM Marthann Schiller, MD 02/15/2022 1101 9 : Omentum Tissue Omentum SURGICAL PATHOLOGY EXAM Marthann Schiller, MD 02/15/2022 1130 10 : Gallbladder Tissue Gallbladder SURGICAL PATHOLOGY EXAM Marthann Schiller, MD 02/15/2022 1143 11 : Right diaphragm Tissue Diaphragm SURGICAL PATHOLOGY EXAM Marthann Schiller, MD 02/15/2022 1143 12 : Left diaphragm nodule Tissue Diaphragm SURGICAL PATHOLOGY EXAM Marthann Schiller, MD 02/15/2022 1157 13 : Spleen Tissue Spleen SURGICAL PATHOLOGY EXAM Marthann Schiller, MD 02/15/2022 1204 14 : Appendix Tissue Appendix SURGICAL PATHOLOGY EXAM Marthann Schiller, MD 02/15/2022 1213 15 : Small bowel mesenteric nodules Tissue Small Bowel SURGICAL PATHOLOGY EXAM Marthann Schiller, MD 02/15/2022 1215 16 : Colon mesenteric nodule Tissue Colon SURGICAL PATHOLOGY EXAM Marthann Schiller, MD 02/15/2022 1221 17 : Hepatic flexure mass Tissue Liver SURGICAL PATHOLOGY EXAM Marthann Schiller, MD [id number]  Complications: None  Indications for Procedure: Zitlally Outler is a 52 y.o. 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 Prior to the procedure, all risks, benefits, and alternatives were discussed and informed surgical consent was signed.  Procedure: Patient was taken to the operating room where general anesthesia was achieved. She was positioned in dorsal lithotomy and prepped and draped per the surgical oncology team. A foley catheter was inserted into the bladder. A 5 mm skin incision was made in the left upper quadrant at Palmer's point, the abdomen was then entered with  direct Optiview entry and the abdomen was insufflated. Findings were noted as above.  The laparoscope was then removed from the abdomen, the pneumoperitoneum was maintained. A vertical midline incision was made with the scalpel and the abdomen was entered sharply. The abdomen and pelvis were surveyed with findings as documented above. A wound protector was placed followed by the [facility] retractor.  The enlarged left adnexal mass was then carefully removed of its bilateral sidewall attachments using a mixture of blunt and cautery dissection. Multiple small bowel loops were adhered to the superior and posterior aspect of the mass with filmy adhesions which were carefully bluntly resected. The rectosigmoid was noted to be densely adhered to the posterior aspect of the pelvic mass. Dissection was attempted using mixture of blunt dissection as well as sharp dissection with the Metzenbaum scissors, and unavoidable enterotomy of approximately 4 cm was made to the anterior surface of the rectosigmoid. This enterotomy was then closed with 4-0 PDS in a running fashion to minimize contamination. Further dissection removing the remainder of the rectosigmoid from the posterior aspect of the pelvic mass was then undertaken. The pelvic mass was then able to be fully elevated from the pelvis. The left infundibulopelvic ligament was able to be isolated with the left ureter clearly visualized below. The left ureter was further dissected away from the medial leaf of the broad ligament, and a vessel loop was placed. The left infundibulopelvic ligament was  then isolated, clamped, and transected using the LigaSure device as well as and suture-ligated. The left utero-ovarian ligament was then isolated, clamped, cauterized and transected. The left pelvic mass was then removed entirely.  Attention was then turned to the right. An additional enlarged multicystic right adnexal mass was noted, this was carefully elevated from the pelvis  using blunt dissection. The right round ligament was then grasped, elevated, and transected. The broad ligament was then opened posteriorly. The ureter was identified and the right infundibulopelvic ligament was then isolated, clamped, transected, and tied. The right utero ovarian ligament was then isolated, clamped, and transected using the LigaSure device and the right adnexal mass was removed.  Attention then was turned toward the hysterectomy. A sponge stick was placed within the vagina. The right broad ligament dissection was opened anteriorly and the bladder flap was developed. The left round ligament was then further transected, and the broad ligament opened posteriorly and anteriorly to complete the bladder flap. The right uterine artery was skeletonized, clamped, transected, and suture-ligated. There was significant parametrial tumor involvement on the left side, and therefore a radical dissection was required to isolate the blood supply. The left ureter was further dissected down to the level near the trigone. The left uterine artery was traced to its origin, isolated, and clamped with vascular clips. Similarly the left uterine vein due to significant necrotic tumor involvement was friable. Detachment was similarly carefully isolated, and clipped with vascular clips. Sequential clamps were then used to transect the remainder of the broad and cardinal ligaments bilaterally, with each pedicle being suture-ligated. A large right angle clamp was then able to be placed below the cervix, and the uterus and cervix were amputated from the vagina. The vaginal cuff was then closed with several figure-of-eight sutures using 0 Vicryl.    02/15/2022 Pathology Results   A: Abdominal fat pad, excision - Fibroadipose tissue with residual mixed malignant germ cell tumor with treatment effect   B: Left adnexa, salpingo-oophorectomy - Residual mixed malignant germ cell tumor with treatment effect, [id number] (See  Comment and Synoptic report) - FIGO stage IIIC - Fallopian tubes with no tumor present   C: Right adnexa, salpingo-oophorectomy - Residual mixed malignant germ cell tumor with treatment effect (See Synoptic report) - Fallopian tubes not identified   D: Left parametrium, biopsy - Fibroadipose tissue with residual mixed malignant germ cell tumor with treatment effect   E: Uterus and cervix, hysterectomy - Right adnexal soft tissue with residual mixed malignant germ cell tumor with treatment effect   Myometrium: - Adenomyosis - Leiomyomata measuring up to 0.7 cm - Extensive chronic serositis   Cervix:  - Ectocervix and endocervix with chronic cervicitis    Endometrium: - Inactive endometrium   F: Low anterior resection - Residual mixed malignant  germ cell tumor with treatment effect involving colonic serosa and pericolonic adipose tissue - Proximal and distal colonic resection margins with chronic serositis, but negative for tumor    G: Right peritoneum, biopsy - Fibroadipose tissue with residual mixed malignant germ cell tumor with treatment effect   H: Right retroperitoneal mass, excision - Fibroadipose tissue with residual mixed malignant germ cell tumor with treatment effect   I: Omentum, omentectomy - Fibroadipose tissue with residual mixed malignant germ cell tumor with treatment effect   J: Gallbladder, cholecystectomy - Gallbladder with mild chronic serositis and fibrous adhesion   K: Right diaphragm, excision - Fibrous tissue with residual mixed malignant germ cell tumor with treatment effect - Hepatic tissue with  no tumor present   L: Left diaphragm nodule, biopsy - Fibrous tissue with residual mixed malignant germ cell tumor with treatment effect   M: Spleen, splenectomy - Residual mixed malignant germ cell tumor with treatment effect involving splenic capsule   N: Appendix, appendectomy - Appendix with fibrous obliteration at the tip - Periappendiceal  adipose tissue with acute and chronic inflammation   O: Small bowel mesenteric nodules, excision - Fibroadipose tissue with residual mixed malignant germ cell tumor with treatment effect   P: Mesenteric nodules, excision - Fibroadipose tissue with residual mixed malignant germ cell tumor with treatment effect   Q: Hepatic flexure mass, excision - Fibroadipose tissue with residual mixed malignant germ cell tumor with treatment effect   R: Anvil and donut, biopsy - Benign segment of colon with acute and chronic inflammation and fibrous adhesion  SPECIMEN    Procedure:    Radical hysterectomy     Procedure:    Omentectomy     Procedure:    Peritoneal tumor debulking     Hysterectomy Type:    Abdominal     Specimen Integrity:          Right Ovary Integrity:    Capsule intact     Specimen Integrity:          Left Ovary Integrity:    Capsule intact   TUMOR    Tumor Site:    Bilateral ovaries     Tumor Size:    Greatest Dimension (Centimeters): 19.5 cm     Histologic Type:    Mixed malignant germ cell tumor:  yolk sac tumor, mature teratoma, mature neural elements, immature neural tissue, and immature cartilage     Ovarian Surface Involvement:    Present, right and left     Other Tissue / Organ Involvement:    Right ovary     Other Tissue / Organ Involvement:    Left ovary     Other Tissue / Organ Involvement:    Right fallopian tube     Other Tissue / Organ Involvement:    Omentum     Other Tissue / Organ Involvement:    Diaphragm and splenic capsule     Largest Extrapelvic Peritoneal Focus:    Macroscopic (greater than 2 cm): Splenic capsule     Peritoneal / Ascitic Fluid Involvement:    Not submitted / unknown   REGIONAL LYMPH NODES     Regional Lymph Node Status:    Not applicable (no regional lymph nodes submitted or found)   DISTANT METASTASIS     Distant Site(s) Involved:    Diaphragm, splenic capsule   pTNM CLASSIFICATION (AJCC 8th Edition)     Reporting of pT, pN, and  (when applicable) pM categories is based on information available to the pathologist at the time the report is issued. As per the AJCC (Chapter 1, 8th Ed.) it is the managing physician's responsibility to establish the final pathologic stage based upon all pertinent information, including but potentially not limited to this pathology report.     Modified Classification:    y     pT Category:    pT3c     pN Category:    pN not assigned (no nodes submitted or found)     pM Category:    pM1   FIGO STAGE     FIGO Stage:    IIIC   COMMENT:  The needle core biopsies show a heterogeneous combination of elements including abundant neural tissue with atypical  and immature features. There are also atypical glandular epithelial structures and focal yolk sac elements with microcystic pattern.  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  Immunohistochemistry is performed for cytokeratin AE1/AE3, CD117, CD30, G FAP, glypican-3, OCT3/4, synaptophysin, S100 and CD30.     03/14/2022 -  Chemotherapy   Patient is on Treatment Plan : Malignant germ cell tumor of ovary D1-5 q21d     03/22/2022 Tumor Marker   Patient's tumor was tested for the following markers: AFP. Results of the tumor marker test revealed 7.9.     PHYSICAL EXAMINATION: ECOG PERFORMANCE STATUS: 1 - Symptomatic but completely ambulatory  Vitals:   04/18/22 1130  BP: (!) 124/58  Pulse: (!) 104  Resp: 18  Temp: 98.8 F (37.1 C)  SpO2: 100%   Filed Weights   04/18/22 1130  Weight: 103 lb 6.4 oz (46.9 kg)    GENERAL:alert, no distress and comfortable  NEURO: alert & oriented x 3 with fluent speech, no focal motor/sensory deficits  LABORATORY DATA:  I have reviewed the data as listed    Component Value Date/Time   NA 139 04/01/2022 1147   K 4.3 04/01/2022 1147   CL 104 04/01/2022 1147   CO2 30 04/01/2022 1147   GLUCOSE 98 04/01/2022 1147   BUN  11 04/01/2022 1147   CREATININE 0.53 04/01/2022 1147   CREATININE 0.75 03/23/2011 1550   CALCIUM 9.6 04/01/2022 1147   PROT 6.8 04/01/2022 1147   ALBUMIN 4.2 04/01/2022 1147   AST 13 (L) 04/01/2022 1147   ALT 21 04/01/2022 1147   ALKPHOS 69 04/01/2022 1147   BILITOT 0.3 04/01/2022 1147   GFRNONAA >60 04/01/2022 1147    No results found for: "SPEP", "UPEP"  Lab Results  Component Value Date   WBC 58.5 (HH) 04/18/2022   NEUTROABS 8.9 (H) 04/18/2022   HGB 9.8 (L) 04/18/2022   HCT 29.6 (L) 04/18/2022   MCV 93.1 04/18/2022   PLT 234 04/18/2022      Chemistry      Component Value Date/Time   NA 139 04/01/2022 1147   K 4.3 04/01/2022 1147   CL 104 04/01/2022 1147   CO2 30 04/01/2022 1147   BUN 11 04/01/2022 1147   CREATININE 0.53 04/01/2022 1147   CREATININE 0.75 03/23/2011 1550      Component Value Date/Time   CALCIUM 9.6 04/01/2022 1147   ALKPHOS 69 04/01/2022 1147   AST 13 (L) 04/01/2022 1147   ALT 21 04/01/2022 1147   BILITOT 0.3 04/01/2022 1147

## 2022-04-18 NOTE — Progress Notes (Signed)
CRITICAL VALUE STICKER  CRITICAL VALUE: WBC 58.5  RECEIVER (on-site recipient of call): Harrel Lemon, RN  DATE & TIME NOTIFIED: 04/18/22 at [employer] (representative from lab): Dorian Furnace  MD NOTIFIED: Dr. Alvy Bimler  TIME OF NOTIFICATION: 04/18/22  RESPONSE:  Will review at MD appt today.

## 2022-04-19 ENCOUNTER — Telehealth: Payer: Self-pay

## 2022-04-19 NOTE — Telephone Encounter (Signed)
Called and given below message. She verbalized understanding. 

## 2022-04-19 NOTE — Telephone Encounter (Signed)
-----   Message from Heath Lark, MD sent at 04/19/2022  7:53 AM EST ----- Her TSH is normal Please let her know

## 2022-04-20 ENCOUNTER — Other Ambulatory Visit: Payer: Self-pay

## 2022-04-22 ENCOUNTER — Inpatient Hospital Stay: Payer: 59

## 2022-04-22 MED FILL — Fosaprepitant Dimeglumine For IV Infusion 150 MG (Base Eq): INTRAVENOUS | Qty: 5 | Status: AC

## 2022-04-22 MED FILL — Dexamethasone Sodium Phosphate Inj 100 MG/10ML: INTRAMUSCULAR | Qty: 1 | Status: AC

## 2022-04-25 ENCOUNTER — Other Ambulatory Visit: Payer: Self-pay | Admitting: Hematology and Oncology

## 2022-04-25 ENCOUNTER — Inpatient Hospital Stay: Payer: 59

## 2022-04-25 ENCOUNTER — Encounter: Payer: Self-pay | Admitting: Hematology and Oncology

## 2022-04-25 VITALS — BP 117/74 | HR 99 | Temp 97.7°F | Resp 18

## 2022-04-25 DIAGNOSIS — Z5111 Encounter for antineoplastic chemotherapy: Secondary | ICD-10-CM | POA: Diagnosis not present

## 2022-04-25 DIAGNOSIS — C569 Malignant neoplasm of unspecified ovary: Secondary | ICD-10-CM

## 2022-04-25 MED ORDER — SODIUM CHLORIDE 0.9 % IV SOLN
12.0000 mg/m2 | Freq: Once | INTRAVENOUS | Status: AC
  Administered 2022-04-25: 17 mg via INTRAVENOUS
  Filled 2022-04-25: qty 17

## 2022-04-25 MED ORDER — POTASSIUM CHLORIDE IN NACL 20-0.9 MEQ/L-% IV SOLN
Freq: Once | INTRAVENOUS | Status: AC
  Filled 2022-04-25: qty 1000

## 2022-04-25 MED ORDER — SODIUM CHLORIDE 0.9 % IV SOLN
150.0000 mg | Freq: Once | INTRAVENOUS | Status: AC
  Administered 2022-04-25: 150 mg via INTRAVENOUS
  Filled 2022-04-25: qty 150

## 2022-04-25 MED ORDER — PALONOSETRON HCL INJECTION 0.25 MG/5ML
0.2500 mg | Freq: Once | INTRAVENOUS | Status: AC
  Administered 2022-04-25: 0.25 mg via INTRAVENOUS
  Filled 2022-04-25: qty 5

## 2022-04-25 MED ORDER — SODIUM CHLORIDE 0.9 % IV SOLN
100.0000 mg/m2 | Freq: Once | INTRAVENOUS | Status: AC
  Administered 2022-04-25: 140 mg via INTRAVENOUS
  Filled 2022-04-25: qty 7

## 2022-04-25 MED ORDER — SODIUM CHLORIDE 0.9% FLUSH
10.0000 mL | INTRAVENOUS | Status: DC | PRN
  Administered 2022-04-25: 10 mL

## 2022-04-25 MED ORDER — SODIUM CHLORIDE 0.9 % IV SOLN
10.0000 mg | Freq: Once | INTRAVENOUS | Status: AC
  Administered 2022-04-25: 10 mg via INTRAVENOUS
  Filled 2022-04-25: qty 10

## 2022-04-25 MED ORDER — FAMOTIDINE IN NACL 20-0.9 MG/50ML-% IV SOLN
20.0000 mg | Freq: Once | INTRAVENOUS | Status: AC
  Administered 2022-04-25: 20 mg via INTRAVENOUS
  Filled 2022-04-25: qty 50

## 2022-04-25 MED ORDER — HEPARIN SOD (PORK) LOCK FLUSH 100 UNIT/ML IV SOLN
500.0000 [IU] | Freq: Once | INTRAVENOUS | Status: AC | PRN
  Administered 2022-04-25: 500 [IU]

## 2022-04-25 MED ORDER — MAGNESIUM SULFATE 2 GM/50ML IV SOLN
2.0000 g | Freq: Once | INTRAVENOUS | Status: AC
  Administered 2022-04-25: 2 g via INTRAVENOUS
  Filled 2022-04-25: qty 50

## 2022-04-25 MED ORDER — SODIUM CHLORIDE 0.9 % IV SOLN: Freq: Once | INTRAVENOUS | Status: AC

## 2022-04-25 MED FILL — Dexamethasone Sodium Phosphate Inj 100 MG/10ML: INTRAMUSCULAR | Qty: 1 | Status: AC

## 2022-04-25 NOTE — Progress Notes (Signed)
Pt reports some ringing in her ears, states it is more on L vs R. Dr Alvy Bimler notified, MD decreased dose of cisplatin.

## 2022-04-25 NOTE — Patient Instructions (Signed)
[facility]  Discharge Instructions: Thank you for choosing [facility] to provide your oncology and hematology care.   If you have a lab appointment with the Cancer Center, please go directly to the Cancer Center and check in at the registration area.   Wear comfortable clothing and clothing appropriate for easy access to any Portacath or PICC line.   We strive to give you quality time with your provider. You may need to reschedule your appointment if you arrive late (15 or more minutes).  Arriving late affects you and other patients whose appointments are after yours.  Also, if you miss three or more appointments without notifying the office, you may be dismissed from the clinic at the provider's discretion.      For prescription refill requests, have your pharmacy contact our office and allow 72 hours for refills to be completed.    Today you received the following chemotherapy and/or immunotherapy agents: Cisplatin and Etoposide      To help prevent nausea and vomiting after your treatment, we encourage you to take your nausea medication as directed.  BELOW ARE SYMPTOMS THAT SHOULD BE REPORTED IMMEDIATELY: *FEVER GREATER THAN 100.4 F (38 C) OR HIGHER *CHILLS OR SWEATING *NAUSEA AND VOMITING THAT IS NOT CONTROLLED WITH YOUR NAUSEA MEDICATION *UNUSUAL SHORTNESS OF BREATH *UNUSUAL BRUISING OR BLEEDING *URINARY PROBLEMS (pain or burning when urinating, or frequent urination) *BOWEL PROBLEMS (unusual diarrhea, constipation, pain near the anus) TENDERNESS IN MOUTH AND THROAT WITH OR WITHOUT PRESENCE OF ULCERS (sore throat, sores in mouth, or a toothache) UNUSUAL RASH, SWELLING OR PAIN  UNUSUAL VAGINAL DISCHARGE OR ITCHING   Items with * indicate a potential emergency and should be followed up as soon as possible or go to the Emergency Department if any problems should occur.  Please show the CHEMOTHERAPY ALERT CARD or IMMUNOTHERAPY ALERT  CARD at check-in to the Emergency Department and triage nurse.  Should you have questions after your visit or need to cancel or reschedule your appointment, please contact [facility]  Dept: [phone]  and follow the prompts.  Office hours are 8:00 a.m. to 4:30 p.m. Monday - Friday. Please note that voicemails left after 4:00 p.m. may not be returned until the following business day.  We are closed weekends and major holidays. You have access to a nurse at all times for urgent questions. Please call the main number to the clinic Dept: [phone] and follow the prompts.   For any non-urgent questions, you may also contact your provider using MyChart. We now offer e-Visits for anyone 18 and older to request care online for non-urgent symptoms. For details visit [email or web address].   Also download the MyChart app! Go to the app store, search "MyChart", open the app, select [facility], and log in with your MyChart username and password.   

## 2022-04-26 ENCOUNTER — Other Ambulatory Visit: Payer: Self-pay

## 2022-04-26 ENCOUNTER — Inpatient Hospital Stay: Payer: 59

## 2022-04-26 VITALS — BP 116/77 | HR 87 | Temp 98.2°F | Wt 106.8 lb

## 2022-04-26 DIAGNOSIS — C569 Malignant neoplasm of unspecified ovary: Secondary | ICD-10-CM

## 2022-04-26 DIAGNOSIS — Z5111 Encounter for antineoplastic chemotherapy: Secondary | ICD-10-CM | POA: Diagnosis not present

## 2022-04-26 MED ORDER — SODIUM CHLORIDE 0.9 % IV SOLN
10.0000 mg | Freq: Once | INTRAVENOUS | Status: AC
  Administered 2022-04-26: 10 mg via INTRAVENOUS
  Filled 2022-04-26: qty 10

## 2022-04-26 MED ORDER — SODIUM CHLORIDE 0.9% FLUSH: 10.0000 mL | INTRAVENOUS | Status: DC | PRN

## 2022-04-26 MED ORDER — FAMOTIDINE IN NACL 20-0.9 MG/50ML-% IV SOLN
20.0000 mg | Freq: Once | INTRAVENOUS | Status: AC
  Administered 2022-04-26: 20 mg via INTRAVENOUS
  Filled 2022-04-26: qty 50

## 2022-04-26 MED ORDER — SODIUM CHLORIDE 0.9 % IV SOLN: Freq: Once | INTRAVENOUS | Status: AC

## 2022-04-26 MED ORDER — POTASSIUM CHLORIDE IN NACL 20-0.9 MEQ/L-% IV SOLN
Freq: Once | INTRAVENOUS | Status: AC
  Filled 2022-04-26: qty 1000

## 2022-04-26 MED ORDER — SODIUM CHLORIDE 0.9 % IV SOLN
100.0000 mg/m2 | Freq: Once | INTRAVENOUS | Status: AC
  Administered 2022-04-26: 140 mg via INTRAVENOUS
  Filled 2022-04-26: qty 7

## 2022-04-26 MED ORDER — SODIUM CHLORIDE 0.9 % IV SOLN
12.0000 mg/m2 | Freq: Once | INTRAVENOUS | Status: AC
  Administered 2022-04-26: 17 mg via INTRAVENOUS
  Filled 2022-04-26: qty 17

## 2022-04-26 MED ORDER — MAGNESIUM SULFATE 2 GM/50ML IV SOLN
2.0000 g | Freq: Once | INTRAVENOUS | Status: AC
  Administered 2022-04-26: 2 g via INTRAVENOUS
  Filled 2022-04-26: qty 50

## 2022-04-26 MED ORDER — HEPARIN SOD (PORK) LOCK FLUSH 100 UNIT/ML IV SOLN: 500.0000 [IU] | Freq: Once | INTRAVENOUS | Status: DC | PRN

## 2022-04-26 MED FILL — Fosaprepitant Dimeglumine For IV Infusion 150 MG (Base Eq): INTRAVENOUS | Qty: 5 | Status: AC

## 2022-04-26 MED FILL — Dexamethasone Sodium Phosphate Inj 100 MG/10ML: INTRAMUSCULAR | Qty: 1 | Status: AC

## 2022-04-26 NOTE — Patient Instructions (Signed)
[facility]  Discharge Instructions: Thank you for choosing [facility] to provide your oncology and hematology care.   If you have a lab appointment with the [facility], please go directly to the [facility] and check in at the registration area.   Wear comfortable clothing and clothing appropriate for easy access to any Portacath or PICC line.   We strive to give you quality time with your provider. You may need to reschedule your appointment if you arrive late (15 or more minutes).  Arriving late affects you and other patients whose appointments are after yours.  Also, if you miss three or more appointments without notifying the office, you may be dismissed from the clinic at the provider's discretion.      For prescription refill requests, have your pharmacy contact our office and allow 72 hours for refills to be completed.    Today you received the following chemotherapy and/or immunotherapy agents :  Cisplatin & Etoposide      To help prevent nausea and vomiting after your treatment, we encourage you to take your nausea medication as directed.  BELOW ARE SYMPTOMS THAT SHOULD BE REPORTED IMMEDIATELY: *FEVER GREATER THAN 100.4 F (38 C) OR HIGHER *CHILLS OR SWEATING *NAUSEA AND VOMITING THAT IS NOT CONTROLLED WITH YOUR NAUSEA MEDICATION *UNUSUAL SHORTNESS OF BREATH *UNUSUAL BRUISING OR BLEEDING *URINARY PROBLEMS (pain or burning when urinating, or frequent urination) *BOWEL PROBLEMS (unusual diarrhea, constipation, pain near the anus) TENDERNESS IN MOUTH AND THROAT WITH OR WITHOUT PRESENCE OF ULCERS (sore throat, sores in mouth, or a toothache) UNUSUAL RASH, SWELLING OR PAIN  UNUSUAL VAGINAL DISCHARGE OR ITCHING   Items with * indicate a potential emergency and should be followed up as soon as possible or go to the Emergency Department if any problems should occur.  Please show the CHEMOTHERAPY ALERT CARD or IMMUNOTHERAPY ALERT  CARD at check-in to the Emergency Department and triage nurse.  Should you have questions after your visit or need to cancel or reschedule your appointment, please contact [facility]  Dept: [phone]  and follow the prompts.  Office hours are 8:00 a.m. to 4:30 p.m. Monday - Friday. Please note that voicemails left after 4:00 p.m. may not be returned until the following business day.  We are closed weekends and major holidays. You have access to a nurse at all times for urgent questions. Please call the main number to the clinic Dept: [phone] and follow the prompts.   For any non-urgent questions, you may also contact your provider using MyChart. We now offer e-Visits for anyone 57 and older to request care online for non-urgent symptoms. For details visit [email or web address].   Also download the MyChart app! Go to the app store, search "MyChart", open the app, select [facility], and log in with your MyChart username and password.

## 2022-04-27 ENCOUNTER — Inpatient Hospital Stay: Payer: 59

## 2022-04-27 VITALS — BP 120/73 | HR 77 | Temp 98.4°F | Resp 18 | Wt 106.8 lb

## 2022-04-27 DIAGNOSIS — Z5111 Encounter for antineoplastic chemotherapy: Secondary | ICD-10-CM | POA: Diagnosis not present

## 2022-04-27 DIAGNOSIS — C569 Malignant neoplasm of unspecified ovary: Secondary | ICD-10-CM

## 2022-04-27 MED ORDER — MAGNESIUM SULFATE 2 GM/50ML IV SOLN
2.0000 g | Freq: Once | INTRAVENOUS | Status: AC
  Administered 2022-04-27: 2 g via INTRAVENOUS
  Filled 2022-04-27: qty 50

## 2022-04-27 MED ORDER — SODIUM CHLORIDE 0.9 % IV SOLN: Freq: Once | INTRAVENOUS | Status: AC

## 2022-04-27 MED ORDER — SODIUM CHLORIDE 0.9 % IV SOLN
100.0000 mg/m2 | Freq: Once | INTRAVENOUS | Status: AC
  Administered 2022-04-27: 140 mg via INTRAVENOUS
  Filled 2022-04-27: qty 7

## 2022-04-27 MED ORDER — HEPARIN SOD (PORK) LOCK FLUSH 100 UNIT/ML IV SOLN
500.0000 [IU] | Freq: Once | INTRAVENOUS | Status: AC | PRN
  Administered 2022-04-27: 500 [IU]

## 2022-04-27 MED ORDER — SODIUM CHLORIDE 0.9% FLUSH
10.0000 mL | INTRAVENOUS | Status: DC | PRN
  Administered 2022-04-27: 10 mL

## 2022-04-27 MED ORDER — SODIUM CHLORIDE 0.9 % IV SOLN
150.0000 mg | Freq: Once | INTRAVENOUS | Status: AC
  Administered 2022-04-27: 150 mg via INTRAVENOUS
  Filled 2022-04-27: qty 150

## 2022-04-27 MED ORDER — POTASSIUM CHLORIDE IN NACL 20-0.9 MEQ/L-% IV SOLN
Freq: Once | INTRAVENOUS | Status: AC
  Filled 2022-04-27: qty 1000

## 2022-04-27 MED ORDER — PALONOSETRON HCL INJECTION 0.25 MG/5ML
0.2500 mg | Freq: Once | INTRAVENOUS | Status: AC
  Administered 2022-04-27: 0.25 mg via INTRAVENOUS
  Filled 2022-04-27: qty 5

## 2022-04-27 MED ORDER — SODIUM CHLORIDE 0.9 % IV SOLN
12.0000 mg/m2 | Freq: Once | INTRAVENOUS | Status: AC
  Administered 2022-04-27: 17 mg via INTRAVENOUS
  Filled 2022-04-27: qty 17

## 2022-04-27 MED ORDER — FAMOTIDINE IN NACL 20-0.9 MG/50ML-% IV SOLN
20.0000 mg | Freq: Once | INTRAVENOUS | Status: AC
  Administered 2022-04-27: 20 mg via INTRAVENOUS
  Filled 2022-04-27: qty 50

## 2022-04-27 MED ORDER — SODIUM CHLORIDE 0.9 % IV SOLN
10.0000 mg | Freq: Once | INTRAVENOUS | Status: AC
  Administered 2022-04-27: 10 mg via INTRAVENOUS
  Filled 2022-04-27: qty 10

## 2022-04-27 MED FILL — Dexamethasone Sodium Phosphate Inj 100 MG/10ML: INTRAMUSCULAR | Qty: 1 | Status: AC

## 2022-04-28 ENCOUNTER — Other Ambulatory Visit: Payer: Self-pay

## 2022-04-28 ENCOUNTER — Inpatient Hospital Stay: Payer: 59

## 2022-04-28 VITALS — BP 131/74 | HR 80 | Temp 97.9°F | Resp 18 | Wt 107.5 lb

## 2022-04-28 DIAGNOSIS — C569 Malignant neoplasm of unspecified ovary: Secondary | ICD-10-CM

## 2022-04-28 DIAGNOSIS — Z5111 Encounter for antineoplastic chemotherapy: Secondary | ICD-10-CM | POA: Diagnosis not present

## 2022-04-28 MED ORDER — MAGNESIUM SULFATE 2 GM/50ML IV SOLN
2.0000 g | Freq: Once | INTRAVENOUS | Status: AC
  Administered 2022-04-28: 2 g via INTRAVENOUS
  Filled 2022-04-28: qty 50

## 2022-04-28 MED ORDER — POTASSIUM CHLORIDE IN NACL 20-0.9 MEQ/L-% IV SOLN
Freq: Once | INTRAVENOUS | Status: AC
  Filled 2022-04-28: qty 1000

## 2022-04-28 MED ORDER — SODIUM CHLORIDE 0.9 % IV SOLN
100.0000 mg/m2 | Freq: Once | INTRAVENOUS | Status: AC
  Administered 2022-04-28: 140 mg via INTRAVENOUS
  Filled 2022-04-28: qty 7

## 2022-04-28 MED ORDER — FAMOTIDINE IN NACL 20-0.9 MG/50ML-% IV SOLN
20.0000 mg | Freq: Once | INTRAVENOUS | Status: AC
  Administered 2022-04-28: 20 mg via INTRAVENOUS
  Filled 2022-04-28: qty 50

## 2022-04-28 MED ORDER — SODIUM CHLORIDE 0.9 % IV SOLN: Freq: Once | INTRAVENOUS | Status: AC

## 2022-04-28 MED ORDER — SODIUM CHLORIDE 0.9% FLUSH
10.0000 mL | INTRAVENOUS | Status: DC | PRN
  Administered 2022-04-28: 10 mL

## 2022-04-28 MED ORDER — SODIUM CHLORIDE 0.9 % IV SOLN
10.0000 mg | Freq: Once | INTRAVENOUS | Status: AC
  Administered 2022-04-28: 10 mg via INTRAVENOUS
  Filled 2022-04-28: qty 10

## 2022-04-28 MED ORDER — HEPARIN SOD (PORK) LOCK FLUSH 100 UNIT/ML IV SOLN
500.0000 [IU] | Freq: Once | INTRAVENOUS | Status: AC | PRN
  Administered 2022-04-28: 500 [IU]

## 2022-04-28 MED ORDER — SODIUM CHLORIDE 0.9 % IV SOLN
12.0000 mg/m2 | Freq: Once | INTRAVENOUS | Status: AC
  Administered 2022-04-28: 17 mg via INTRAVENOUS
  Filled 2022-04-28: qty 17

## 2022-04-28 MED FILL — Dexamethasone Sodium Phosphate Inj 100 MG/10ML: INTRAMUSCULAR | Qty: 1 | Status: AC

## 2022-04-28 MED FILL — Fosaprepitant Dimeglumine For IV Infusion 150 MG (Base Eq): INTRAVENOUS | Qty: 5 | Status: AC

## 2022-04-28 NOTE — Patient Instructions (Signed)
[facility]  Discharge Instructions: Thank you for choosing [facility] to provide your oncology and hematology care.   If you have a lab appointment with the [facility], please go directly to the [facility] and check in at the registration area.   Wear comfortable clothing and clothing appropriate for easy access to any Portacath or PICC line.   We strive to give you quality time with your provider. You may need to reschedule your appointment if you arrive late (15 or more minutes).  Arriving late affects you and other patients whose appointments are after yours.  Also, if you miss three or more appointments without notifying the office, you may be dismissed from the clinic at the provider's discretion.      For prescription refill requests, have your pharmacy contact our office and allow 72 hours for refills to be completed.    Today you received the following chemotherapy and/or immunotherapy agents Cisplatin, etoposide       To help prevent nausea and vomiting after your treatment, we encourage you to take your nausea medication as directed.  BELOW ARE SYMPTOMS THAT SHOULD BE REPORTED IMMEDIATELY: *FEVER GREATER THAN 100.4 F (38 C) OR HIGHER *CHILLS OR SWEATING *NAUSEA AND VOMITING THAT IS NOT CONTROLLED WITH YOUR NAUSEA MEDICATION *UNUSUAL SHORTNESS OF BREATH *UNUSUAL BRUISING OR BLEEDING *URINARY PROBLEMS (pain or burning when urinating, or frequent urination) *BOWEL PROBLEMS (unusual diarrhea, constipation, pain near the anus) TENDERNESS IN MOUTH AND THROAT WITH OR WITHOUT PRESENCE OF ULCERS (sore throat, sores in mouth, or a toothache) UNUSUAL RASH, SWELLING OR PAIN  UNUSUAL VAGINAL DISCHARGE OR ITCHING   Items with * indicate a potential emergency and should be followed up as soon as possible or go to the Emergency Department if any problems should occur.  Please show the CHEMOTHERAPY ALERT CARD or IMMUNOTHERAPY ALERT  CARD at check-in to the Emergency Department and triage nurse.  Should you have questions after your visit or need to cancel or reschedule your appointment, please contact [facility]  Dept: [phone]  and follow the prompts.  Office hours are 8:00 a.m. to 4:30 p.m. Monday - Friday. Please note that voicemails left after 4:00 p.m. may not be returned until the following business day.  We are closed weekends and major holidays. You have access to a nurse at all times for urgent questions. Please call the main number to the clinic Dept: [phone] and follow the prompts.   For any non-urgent questions, you may also contact your provider using MyChart. We now offer e-Visits for anyone 67 and older to request care online for non-urgent symptoms. For details visit [email or web address].   Also download the MyChart app! Go to the app store, search "MyChart", open the app, select [facility], and log in with your MyChart username and password.

## 2022-04-29 ENCOUNTER — Inpatient Hospital Stay: Payer: 59

## 2022-04-29 VITALS — BP 120/83 | HR 82 | Temp 97.9°F | Resp 16 | Wt 106.8 lb

## 2022-04-29 DIAGNOSIS — Z5111 Encounter for antineoplastic chemotherapy: Secondary | ICD-10-CM | POA: Diagnosis not present

## 2022-04-29 DIAGNOSIS — C569 Malignant neoplasm of unspecified ovary: Secondary | ICD-10-CM

## 2022-04-29 MED ORDER — SODIUM CHLORIDE 0.9 % IV SOLN
12.0000 mg/m2 | Freq: Once | INTRAVENOUS | Status: AC
  Administered 2022-04-29: 17 mg via INTRAVENOUS
  Filled 2022-04-29: qty 17

## 2022-04-29 MED ORDER — FAMOTIDINE IN NACL 20-0.9 MG/50ML-% IV SOLN
20.0000 mg | Freq: Once | INTRAVENOUS | Status: AC
  Administered 2022-04-29: 20 mg via INTRAVENOUS
  Filled 2022-04-29: qty 50

## 2022-04-29 MED ORDER — SODIUM CHLORIDE 0.9% FLUSH
10.0000 mL | INTRAVENOUS | Status: DC | PRN
  Administered 2022-04-29: 10 mL

## 2022-04-29 MED ORDER — HEPARIN SOD (PORK) LOCK FLUSH 100 UNIT/ML IV SOLN
500.0000 [IU] | Freq: Once | INTRAVENOUS | Status: AC | PRN
  Administered 2022-04-29: 500 [IU]

## 2022-04-29 MED ORDER — SODIUM CHLORIDE 0.9 % IV SOLN: Freq: Once | INTRAVENOUS | Status: AC

## 2022-04-29 MED ORDER — POTASSIUM CHLORIDE IN NACL 20-0.9 MEQ/L-% IV SOLN
Freq: Once | INTRAVENOUS | Status: AC
  Filled 2022-04-29: qty 1000

## 2022-04-29 MED ORDER — SODIUM CHLORIDE 0.9 % IV SOLN
100.0000 mg/m2 | Freq: Once | INTRAVENOUS | Status: AC
  Administered 2022-04-29: 140 mg via INTRAVENOUS
  Filled 2022-04-29: qty 7

## 2022-04-29 MED ORDER — PALONOSETRON HCL INJECTION 0.25 MG/5ML
0.2500 mg | Freq: Once | INTRAVENOUS | Status: AC
  Administered 2022-04-29: 0.25 mg via INTRAVENOUS
  Filled 2022-04-29: qty 5

## 2022-04-29 MED ORDER — MAGNESIUM SULFATE 2 GM/50ML IV SOLN
2.0000 g | Freq: Once | INTRAVENOUS | Status: AC
  Administered 2022-04-29: 2 g via INTRAVENOUS
  Filled 2022-04-29: qty 50

## 2022-04-29 MED ORDER — SODIUM CHLORIDE 0.9 % IV SOLN
10.0000 mg | Freq: Once | INTRAVENOUS | Status: AC
  Administered 2022-04-29: 10 mg via INTRAVENOUS
  Filled 2022-04-29: qty 10

## 2022-04-29 MED ORDER — SODIUM CHLORIDE 0.9 % IV SOLN
150.0000 mg | Freq: Once | INTRAVENOUS | Status: AC
  Administered 2022-04-29: 150 mg via INTRAVENOUS
  Filled 2022-04-29: qty 150

## 2022-04-29 NOTE — Patient Instructions (Signed)
[facility]  Discharge Instructions: Thank you for choosing [facility] to provide your oncology and hematology care.   If you have a lab appointment with the [facility], please go directly to the [facility] and check in at the registration area.   Wear comfortable clothing and clothing appropriate for easy access to any Portacath or PICC line.   We strive to give you quality time with your provider. You may need to reschedule your appointment if you arrive late (15 or more minutes).  Arriving late affects you and other patients whose appointments are after yours.  Also, if you miss three or more appointments without notifying the office, you may be dismissed from the clinic at the provider's discretion.      For prescription refill requests, have your pharmacy contact our office and allow 72 hours for refills to be completed.    Today you received the following chemotherapy and/or immunotherapy agents cisplatin, etoposide      To help prevent nausea and vomiting after your treatment, we encourage you to take your nausea medication as directed.  BELOW ARE SYMPTOMS THAT SHOULD BE REPORTED IMMEDIATELY: *FEVER GREATER THAN 100.4 F (38 C) OR HIGHER *CHILLS OR SWEATING *NAUSEA AND VOMITING THAT IS NOT CONTROLLED WITH YOUR NAUSEA MEDICATION *UNUSUAL SHORTNESS OF BREATH *UNUSUAL BRUISING OR BLEEDING *URINARY PROBLEMS (pain or burning when urinating, or frequent urination) *BOWEL PROBLEMS (unusual diarrhea, constipation, pain near the anus) TENDERNESS IN MOUTH AND THROAT WITH OR WITHOUT PRESENCE OF ULCERS (sore throat, sores in mouth, or a toothache) UNUSUAL RASH, SWELLING OR PAIN  UNUSUAL VAGINAL DISCHARGE OR ITCHING   Items with * indicate a potential emergency and should be followed up as soon as possible or go to the Emergency Department if any problems should occur.  Please show the CHEMOTHERAPY ALERT CARD or IMMUNOTHERAPY ALERT  CARD at check-in to the Emergency Department and triage nurse.  Should you have questions after your visit or need to cancel or reschedule your appointment, please contact [facility]  Dept: [phone]  and follow the prompts.  Office hours are 8:00 a.m. to 4:30 p.m. Monday - Friday. Please note that voicemails left after 4:00 p.m. may not be returned until the following business day.  We are closed weekends and major holidays. You have access to a nurse at all times for urgent questions. Please call the main number to the clinic Dept: [phone] and follow the prompts.   For any non-urgent questions, you may also contact your provider using MyChart. We now offer e-Visits for anyone 30 and older to request care online for non-urgent symptoms. For details visit [email or web address].   Also download the MyChart app! Go to the app store, search "MyChart", open the app, select [facility], and log in with your MyChart username and password.

## 2022-05-02 ENCOUNTER — Ambulatory Visit: Payer: Self-pay | Admitting: Genetic Counselor

## 2022-05-02 ENCOUNTER — Other Ambulatory Visit: Payer: Self-pay | Admitting: Hematology and Oncology

## 2022-05-02 ENCOUNTER — Inpatient Hospital Stay: Payer: 59

## 2022-05-02 ENCOUNTER — Encounter: Payer: Self-pay | Admitting: Genetic Counselor

## 2022-05-02 ENCOUNTER — Telehealth: Payer: Self-pay | Admitting: Genetic Counselor

## 2022-05-02 DIAGNOSIS — Z1379 Encounter for other screening for genetic and chromosomal anomalies: Secondary | ICD-10-CM

## 2022-05-02 DIAGNOSIS — C569 Malignant neoplasm of unspecified ovary: Secondary | ICD-10-CM

## 2022-05-02 DIAGNOSIS — Z5111 Encounter for antineoplastic chemotherapy: Secondary | ICD-10-CM | POA: Diagnosis not present

## 2022-05-02 LAB — CBC WITH DIFFERENTIAL/PLATELET
Abs Immature Granulocytes: 0.1 10*3/uL — ABNORMAL HIGH (ref 0.00–0.07)
Basophils Absolute: 0 10*3/uL (ref 0.0–0.1)
Basophils Relative: 0 %
Eosinophils Absolute: 0 10*3/uL (ref 0.0–0.5)
Eosinophils Relative: 0 %
HCT: 33.9 % — ABNORMAL LOW (ref 36.0–46.0)
Hemoglobin: 11.5 g/dL — ABNORMAL LOW (ref 12.0–15.0)
Immature Granulocytes: 1 %
Lymphocytes Relative: 13 %
Lymphs Abs: 1.1 10*3/uL (ref 0.7–4.0)
MCH: 31.1 pg (ref 26.0–34.0)
MCHC: 33.9 g/dL (ref 30.0–36.0)
MCV: 91.6 fL (ref 80.0–100.0)
Monocytes Absolute: 0.1 10*3/uL (ref 0.1–1.0)
Monocytes Relative: 1 %
Neutro Abs: 6.8 10*3/uL (ref 1.7–7.7)
Neutrophils Relative %: 85 %
Platelets: 379 10*3/uL (ref 150–400)
RBC: 3.7 MIL/uL — ABNORMAL LOW (ref 3.87–5.11)
RDW: 18.7 % — ABNORMAL HIGH (ref 11.5–15.5)
Smear Review: NORMAL
WBC: 8.1 10*3/uL (ref 4.0–10.5)
nRBC: 0 % (ref 0.0–0.2)

## 2022-05-02 LAB — SAMPLE TO BLOOD BANK

## 2022-05-02 MED ORDER — SODIUM CHLORIDE 0.9 % IV SOLN: Freq: Once | INTRAVENOUS | Status: AC

## 2022-05-02 MED ORDER — HEPARIN SOD (PORK) LOCK FLUSH 100 UNIT/ML IV SOLN
500.0000 [IU] | Freq: Once | INTRAVENOUS | Status: AC | PRN
  Administered 2022-05-02: 500 [IU]

## 2022-05-02 MED ORDER — SODIUM CHLORIDE 0.9% FLUSH
10.0000 mL | Freq: Once | INTRAVENOUS | Status: AC | PRN
  Administered 2022-05-02: 10 mL

## 2022-05-02 MED ORDER — SODIUM CHLORIDE 0.9% FLUSH
10.0000 mL | Freq: Once | INTRAVENOUS | Status: AC
  Administered 2022-05-02: 10 mL

## 2022-05-02 MED ORDER — PEGFILGRASTIM-CBQV 6 MG/0.6ML ~~LOC~~ SOSY
6.0000 mg | PREFILLED_SYRINGE | Freq: Once | SUBCUTANEOUS | Status: AC
  Administered 2022-05-02: 6 mg via SUBCUTANEOUS
  Filled 2022-05-02: qty 0.6

## 2022-05-02 NOTE — Telephone Encounter (Signed)
Revealed negative genetic testing.  Discussed that we do not know why she has ovarian cancer or why there is cancer in the family. It could be due to a different gene that we are not testing, or maybe our current technology may not be able to pick something up.  It will be important for her to keep in contact with genetics to keep up with whether additional testing may be needed.   

## 2022-05-02 NOTE — Progress Notes (Signed)
HPI:  [name] was previously seen in the [facility] due to a personal and family history of cancer and concerns regarding a hereditary predisposition to cancer. Please refer to our prior cancer genetics clinic note for more information regarding our discussion, assessment and recommendations, at the time. [name] recent genetic test results were disclosed to her, as were recommendations warranted by these results. These results and recommendations are discussed in more detail below.  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 21.1 by 10.9 by 18.7 cm abdominopelvic mass, with a satellite mass in the right upper quadrant anteriorly, and moderate ascites with tumor deposits along the pelvic ascites posteriorly. The mass could be arising from right or left ovary, or both. The mass displaces the bowel and IVC, but there is no IVC thrombus or pelvic DVT identified. 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 2. Additional satellite mass above the liver is partially included, along with tumor deposits along the ascites. 3. Low-level edema along the lateral abdominal wall musculature. 4. Flattening of the IVC at the level of the aortic bifurcation due to the large abdominopelvic tumor. No findings of pelvic DVT.     12/07/2021 Imaging   CT abdomen and pelvis 1. Large central abdominal and pelvic mass consistent with known malignancy. This is incompletely visualized by this CT of the abdomen, but was seen on recent pelvic MRI. 2. 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 Right omental implant consistent with metastatic disease. Small amount of abdominal ascites. 3. No  evidence for bowel or ureteral obstruction. 4. Aortic Atherosclerosis (ICD10-I70.0). 5. Chest findings dictated separately.   12/07/2021 Imaging   CT chest 1. Negative for pulmonary embolism. And no acute or metastatic process identified in the Chest. 2. Partially visible liver mass and ascites, staging CT Abdomen today is reported separately.     12/07/2021 Procedure   Successful placement of a right internal jugular approach power injectable Port-A-Cath. The catheter is ready for immediate use.   12/07/2021 Procedure   Ultrasound-guided biopsy of right upper quadrant mass.    12/08/2021 Initial Diagnosis   Ovarian cancer ([place])   12/08/2021 Cancer Staging   Staging form: Ovary, Fallopian Tube, and Primary Peritoneal Carcinoma, AJCC 8th Edition - Clinical stage from 12/08/2021: FIGO Stage IIIC (cT3c, cN0, cM0) - Signed by Heath Lark, MD on 12/10/2021 Stage prefix: Initial diagnosis   12/20/2021 - 12/28/2021 Chemotherapy   Patient is on Treatment Plan : Ovarian germ cell tumor q21d x 3 Cycles     01/03/2022 Imaging   IMPRESSION: 1. Large central abdominopelvic mass compatible with malignancy, 3450 cubic cm in volume. This may be arising from the ovaries with slight eccentricity to the right in the pelvis. 2. Mild increase in size of the right omental tumor deposit. 3. Mild increase in size of the subcapsular mass along the dome of the right hepatic lobe, with suspected hepatic invasion. 4. Scattered malignant ascites. 5. No dilated bowel to suggest obstruction. 6. 2 mm left kidney lower pole nonobstructive renal calculus. 7. Mild abdominal aortic atherosclerotic vascular disease.   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  Postoperative Diagnosis: Status post R0 cytoreductive surgery and heating intraperitoneal chemotherapy  Procedures: LAPAROSCOPY, ABDOMEN, PERITONEUM, & OMENTUM,  DIAGNOSTIC, W/WO COLLECTION SPECIMEN(S) BY BRUSHING OR WASHING  EXCISION/DESTRUCTION, OPEN, INTRA-ABD TUMOR/CYST/ENDOMETRIOMA, 1+ PERITONEAL/MESENTERI/RETROPERIT 5CM OR <COLECTOMY, PARTIAL; WITH ANASTOMOSIS COLOSTOMY OR SKIN LEVEL CECOSTOMY SPLENECTOMY; TOT (SEPART PROC) EXPLORATORY LAPAROTOMY, EXPLORATORY CELIOTOMY WITH OR WITHOUT BIOPSY(S) HYPERTHERMIA, EXTERNALLY GENERATED; DEEP CHEMOTHERAPY ADMINISTRATION INTO THE PERITONEAL CAVITY VIA INDWELLING PORT OR CATHETER RESECTION (INITIAL) OVARIAN, TUBAL/PRIM PERITONEAL MALIG W/BIL S&O/OMENTECT; W/RAD DISSECTION FOR DEBULKING CHOLECYSTECTOMY APPENDECTOMY OMENTECTOMY, EPIPLOECTOMY, RESECTION OF OMENTUM Procedures: Exploratory laparotomy, total abdominal hysterectomy with radical dissection, bilateral ureterolysis, bilateral salpingo-oophorectomy, posterior cul-de-sac peritonectomy, tumor debulking by Dr Berline Lopes with Gynecology Oncology Dr [place] Scrape with surgical oncology: Diagnostic laparoscopy, exploratory laparotomy, low anterior resection with primary end-to-end anastomosis, enterolysis, cholecystectomy, tumor capsule tumor debulking, splenectomy, appendectomy, omentectomy, diverting loop ileostomy formation  Surgeon: Jeral Pinch, MD  Findings: On diagnostic laparoscopy, only able to clearly visualize the left upper quadrant. Adhesions of bowel in the right upper quadrant limiting survey, minimal ascites noted in the pelvis, scattered tumor plaques visualized on left diaphragm. Given these findings, the decision was made to convert to exploratory laparotomy. Multiple loops of small bowel encased in a Cancn like structure in the midline with multiple filmy adhesions tethering to the superior aspect of the a large pelvic mass. Multicystic and necrotic pelvic mass originating from left ovary, approximately 18 x 20 cm incorporated within the left broad ligament with the left tube draped overlying the left ovarian mass. Bilateral tubes normal in  appearance, normal-appearing uterus with smooth serosa. Right adnexa similarly with approximate 10 x 8 multicystic and necrotic mass. Diffuse tumor plaques encompassing posterior cul-de-sac peritoneum. Rectosigmoid densely adhered to posterior aspect of left adnexal mass requiring dissection and ultimately resection. Scattered treated subcentimeter nodules along small bowel mesentery. 5 x 5 cm necrotic and multicystic tumor at the level of the hepatic flexure. Enlarged similar multicystic necrotic tumor adherent between the right diaphragm and the liver without any evidence of liver invasion and tumor encased solely within the liver capsule. Multiple tumor nodules along the spleen requiring splenectomy. Omentum overall fairly normal in appearance with only scattered small tumor deposits, no omental cake was encountered. Please see surgical oncology operative note for further details regarding operative findings for their portion of the procedure.  Specimens: ID Type Source Tests Collected by Time Destination 1 : Abdominal fat pad Tissue Abdomen SURGICAL PATHOLOGY EXAM Marthann Schiller, MD 02/15/2022 [phone] 2 : Left adnexa Tissue Abdomen SURGICAL PATHOLOGY EXAM Marthann Schiller, MD 02/15/2022 [phone] 3 : right adnexa Tissue Abdomen SURGICAL PATHOLOGY EXAM Marthann Schiller, MD 02/15/2022 [phone] 4 : Left perimetrium Tissue Uterus SURGICAL PATHOLOGY EXAM Lafonda Mosses, MD 02/15/2022 [phone] : Uterus and cervix Tissue Uterus SURGICAL PATHOLOGY EXAM Marthann Schiller, MD 02/15/2022 1018 6 : Low Anterior Resection Tissue Abdomen SURGICAL PATHOLOGY EXAM Marthann Schiller, MD 02/15/2022 1055 7 : Right peritoneum Tissue Abdomen SURGICAL PATHOLOGY EXAM Marthann Schiller, MD 02/15/2022 1058 8 : Right retroperitoneal mass Tissue Abdomen SURGICAL PATHOLOGY EXAM Marthann Schiller, MD 02/15/2022 1101 9 : Omentum Tissue Omentum SURGICAL PATHOLOGY EXAM Marthann Schiller, MD  02/15/2022 1130 10 : Gallbladder Tissue Gallbladder SURGICAL PATHOLOGY EXAM Marthann Schiller, MD 02/15/2022 1143 11 : Right diaphragm Tissue Diaphragm SURGICAL PATHOLOGY EXAM Marthann Schiller, MD 02/15/2022 1143 12 : Left diaphragm nodule Tissue Diaphragm SURGICAL PATHOLOGY EXAM Marthann Schiller, MD 02/15/2022 1157 13 : Spleen Tissue Spleen SURGICAL PATHOLOGY  Iantha Fallen, MD 02/15/2022 1204 14 : Appendix Tissue Appendix SURGICAL PATHOLOGY EXAM Marthann Schiller, MD 02/15/2022 1213 15 : Small bowel mesenteric nodules Tissue Small Bowel SURGICAL PATHOLOGY EXAM Marthann Schiller, MD 02/15/2022 1215 16 : Colon mesenteric nodule Tissue Colon SURGICAL PATHOLOGY EXAM Marthann Schiller, MD 02/15/2022 1221 17 : Hepatic flexure mass Tissue Liver SURGICAL PATHOLOGY EXAM Marthann Schiller, MD [id number]  Complications: None  Indications for Procedure: [name] is a 52 y.o. 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 Prior to the procedure, all risks, benefits, and alternatives were discussed and informed surgical consent was signed.  Procedure: Patient was taken to the operating room where general anesthesia was achieved. She was positioned in dorsal lithotomy and prepped and draped per the surgical oncology team. A foley catheter was inserted into the bladder. A 5 mm skin incision was made in the left upper quadrant at Palmer's point, the abdomen was then entered with direct Optiview entry and the abdomen was insufflated. Findings were noted as above.  The laparoscope was then removed from the abdomen, the pneumoperitoneum was maintained. A vertical midline incision was made with the scalpel and the abdomen was entered sharply. The abdomen and pelvis were surveyed with findings as documented above. A wound protector was placed followed by the [facility]  retractor.  The enlarged left adnexal mass was then carefully removed of its bilateral sidewall attachments using a mixture of blunt and cautery dissection. Multiple small bowel loops were adhered to the superior and posterior aspect of the mass with filmy adhesions which were carefully bluntly resected. The rectosigmoid was noted to be densely adhered to the posterior aspect of the pelvic mass. Dissection was attempted using mixture of blunt dissection as well as sharp dissection with the Metzenbaum scissors, and unavoidable enterotomy of approximately 4 cm was made to the anterior surface of the rectosigmoid. This enterotomy was then closed with 4-0 PDS in a running fashion to minimize contamination. Further dissection removing the remainder of the rectosigmoid from the posterior aspect of the pelvic mass was then undertaken. The pelvic mass was then able to be fully elevated from the pelvis. The left infundibulopelvic ligament was able to be isolated with the left ureter clearly visualized below. The left ureter was further dissected away from the medial leaf of the broad ligament, and a vessel loop was placed. The left infundibulopelvic ligament was then isolated, clamped, and transected using the LigaSure device as well as and suture-ligated. The left utero-ovarian ligament was then isolated, clamped, cauterized and transected. The left pelvic mass was then removed entirely.  Attention was then turned to the right. An additional enlarged multicystic right adnexal mass was noted, this was carefully elevated from the pelvis using blunt dissection. The right round ligament was then grasped, elevated, and transected. The broad ligament was then opened posteriorly. The ureter was identified and the right infundibulopelvic ligament was then isolated, clamped, transected, and tied. The right utero ovarian ligament was then isolated, clamped, and transected using the LigaSure device and the right adnexal mass was  removed.  Attention then was turned toward the hysterectomy. A sponge stick was placed within the vagina. The right broad ligament dissection was opened anteriorly and the bladder flap was developed. The left round ligament was then further transected, and the broad ligament opened posteriorly and anteriorly to complete the bladder flap. The right uterine artery  was skeletonized, clamped, transected, and suture-ligated. There was significant parametrial tumor involvement on the left side, and therefore a radical dissection was required to isolate the blood supply. The left ureter was further dissected down to the level near the trigone. The left uterine artery was traced to its origin, isolated, and clamped with vascular clips. Similarly the left uterine vein due to significant necrotic tumor involvement was friable. Detachment was similarly carefully isolated, and clipped with vascular clips. Sequential clamps were then used to transect the remainder of the broad and cardinal ligaments bilaterally, with each pedicle being suture-ligated. A large right angle clamp was then able to be placed below the cervix, and the uterus and cervix were amputated from the vagina. The vaginal cuff was then closed with several figure-of-eight sutures using 0 Vicryl.    02/15/2022 Pathology Results   A: Abdominal fat pad, excision - Fibroadipose tissue with residual mixed malignant germ cell tumor with treatment effect   B: Left adnexa, salpingo-oophorectomy - Residual mixed malignant germ cell tumor with treatment effect, [id number] (See Comment and Synoptic report) - FIGO stage IIIC - Fallopian tubes with no tumor present   C: Right adnexa, salpingo-oophorectomy - Residual mixed malignant germ cell tumor with treatment effect (See Synoptic report) - Fallopian tubes not identified   D: Left parametrium, biopsy - Fibroadipose tissue with residual mixed malignant germ cell tumor with treatment effect   E: Uterus and  cervix, hysterectomy - Right adnexal soft tissue with residual mixed malignant germ cell tumor with treatment effect   Myometrium: - Adenomyosis - Leiomyomata measuring up to 0.7 cm - Extensive chronic serositis   Cervix:  - Ectocervix and endocervix with chronic cervicitis    Endometrium: - Inactive endometrium   F: Low anterior resection - Residual mixed malignant  germ cell tumor with treatment effect involving colonic serosa and pericolonic adipose tissue - Proximal and distal colonic resection margins with chronic serositis, but negative for tumor    G: Right peritoneum, biopsy - Fibroadipose tissue with residual mixed malignant germ cell tumor with treatment effect   H: Right retroperitoneal mass, excision - Fibroadipose tissue with residual mixed malignant germ cell tumor with treatment effect   I: Omentum, omentectomy - Fibroadipose tissue with residual mixed malignant germ cell tumor with treatment effect   J: Gallbladder, cholecystectomy - Gallbladder with mild chronic serositis and fibrous adhesion   K: Right diaphragm, excision - Fibrous tissue with residual mixed malignant germ cell tumor with treatment effect - Hepatic tissue with no tumor present   L: Left diaphragm nodule, biopsy - Fibrous tissue with residual mixed malignant germ cell tumor with treatment effect   M: Spleen, splenectomy - Residual mixed malignant germ cell tumor with treatment effect involving splenic capsule   N: Appendix, appendectomy - Appendix with fibrous obliteration at the tip - Periappendiceal adipose tissue with acute and chronic inflammation   O: Small bowel mesenteric nodules, excision - Fibroadipose tissue with residual mixed malignant germ cell tumor with treatment effect   P: Mesenteric nodules, excision - Fibroadipose tissue with residual mixed malignant germ cell tumor with treatment effect   Q: Hepatic flexure mass, excision - Fibroadipose tissue with residual mixed  malignant germ cell tumor with treatment effect   R: Anvil and donut, biopsy - Benign segment of colon with acute and chronic inflammation and fibrous adhesion  SPECIMEN    Procedure:    Radical hysterectomy     Procedure:    Omentectomy     Procedure:  Peritoneal tumor debulking     Hysterectomy Type:    Abdominal     Specimen Integrity:          Right Ovary Integrity:    Capsule intact     Specimen Integrity:          Left Ovary Integrity:    Capsule intact   TUMOR    Tumor Site:    Bilateral ovaries     Tumor Size:    Greatest Dimension (Centimeters): 19.5 cm     Histologic Type:    Mixed malignant germ cell tumor:  yolk sac tumor, mature teratoma, mature neural elements, immature neural tissue, and immature cartilage     Ovarian Surface Involvement:    Present, right and left     Other Tissue / Organ Involvement:    Right ovary     Other Tissue / Organ Involvement:    Left ovary     Other Tissue / Organ Involvement:    Right fallopian tube     Other Tissue / Organ Involvement:    Omentum     Other Tissue / Organ Involvement:    Diaphragm and splenic capsule     Largest Extrapelvic Peritoneal Focus:    Macroscopic (greater than 2 cm): Splenic capsule     Peritoneal / Ascitic Fluid Involvement:    Not submitted / unknown   REGIONAL LYMPH NODES     Regional Lymph Node Status:    Not applicable (no regional lymph nodes submitted or found)   DISTANT METASTASIS     Distant Site(s) Involved:    Diaphragm, splenic capsule   pTNM CLASSIFICATION (AJCC 8th Edition)     Reporting of pT, pN, and (when applicable) pM categories is based on information available to the pathologist at the time the report is issued. As per the AJCC (Chapter 1, 8th Ed.) it is the managing physician's responsibility to establish the final pathologic stage based upon all pertinent information, including but potentially not limited to this pathology report.     Modified Classification:    y     pT Category:     pT3c     pN Category:    pN not assigned (no nodes submitted or found)     pM Category:    pM1   FIGO STAGE     FIGO Stage:    IIIC   COMMENT:  The needle core biopsies show a heterogeneous combination of elements including abundant neural tissue with atypical and immature features. There are also atypical glandular epithelial structures and focal yolk sac elements with microcystic pattern.  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  Immunohistochemistry is performed for cytokeratin AE1/AE3, CD117, CD30, G FAP, glypican-3, OCT3/4, synaptophysin, S100 and CD30.     03/14/2022 -  Chemotherapy   Patient is on Treatment Plan : Malignant germ cell tumor of ovary D1-5 q21d     03/22/2022 Tumor Marker   Patient's tumor was tested for the following markers: AFP. Results of the tumor marker test revealed 7.9.   04/23/2022 Genetic Testing   Negative genetic testing on the CancerNext-Expanded+RNAinsight panel.  The report date is April 23, 2022.  The CancerNext-Expanded gene panel offered by [facility] and includes sequencing and rearrangement analysis for the following 77 genes: AIP, ALK, APC*, ATM*, AXIN2, BAP1, BARD1, BMPR1A, BRCA1*, BRCA2*, BRIP1*, CDC73, CDH1*, CDK4, CDKN1B, CDKN2A, CHEK2*, CTNNA1, DICER1, FH, FLCN, KIF1B, LZTR1, MAX, MEN1,  MET, MLH1*, MSH2*, MSH3, MSH6*, MUTYH*, NF1*, NF2, NTHL1, PALB2*, PHOX2B, PMS2*, POT1, PRKAR1A, PTCH1, PTEN*, RAD51C*, RAD51D*, RB1, RET, SDHA, SDHAF2, SDHB, SDHC, SDHD, SMAD4, SMARCA4, SMARCB1, SMARCE1, STK11, SUFU, TMEM127, TP53*, TSC1, TSC2, and VHL (sequencing and deletion/duplication); EGFR, EGLN1, HOXB13, KIT, MITF, PDGFRA, POLD1, and POLE (sequencing only); EPCAM and GREM1 (deletion/duplication only). DNA and RNA analyses performed for * genes.      FAMILY HISTORY:  We obtained a detailed, 4-generation family history.  Significant diagnoses are listed below: Family  History  Problem Relation Age of Onset   Rheum arthritis Mother    Stroke Mother    Hypertension Mother    Heart disease Mother    Arthritis Mother    Breast cancer Mother 24   Heart attack Mother 33   Diabetes Father    Heart disease Father    Hyperlipidemia Father    Hypertension Father    Heart attack Father 56   Non-Hodgkin's lymphoma Paternal Uncle 45   Heart disease Maternal Grandmother    Heart attack Maternal Grandmother 88   Arthritis Maternal Grandmother    Early death Maternal Grandfather 30       MVA   Diabetes Paternal Grandmother    Hypertension Paternal Grandmother    Heart disease Paternal Grandmother    Heart attack Paternal Grandmother 71   Heart disease Paternal Grandfather    Heart attack Paternal Grandfather 92   Coronary artery disease Other    Glaucoma Other        The patient has three children who are cancer free.  She has one brother who is cancer free.  Both parents are living.   The patient's mother had breast cancer at 44.  She has two sisters who are cancer free.  Her parents died of non-cancer related issues.   The patient's father is living at 33.  He has a sister and brother.  The brother was diagnosed with lymphoma at 58.  His parents are deceased from non-cancer related issues.   Ms. Botbyl is unaware of previous family history of genetic testing for hereditary cancer risks. Patient's maternal ancestors are of [place] and [place] descent, and paternal ancestors are of [place] and [place] descent. There is no reported Ashkenazi Jewish ancestry. There is no known consanguinity.  GENETIC TEST RESULTS: Genetic testing reported out on April 23, 2022 through the CancerNext-Expanded+RNAinsight cancer panel found no pathogenic mutations. The CancerNext-Expanded gene panel offered by [facility] and includes sequencing and rearrangement analysis for the following 77 genes: AIP, ALK, APC*, ATM*, AXIN2, BAP1, BARD1, BMPR1A, BRCA1*, BRCA2*, BRIP1*,  CDC73, CDH1*, CDK4, CDKN1B, CDKN2A, CHEK2*, CTNNA1, DICER1, FH, FLCN, KIF1B, LZTR1, MAX, MEN1, MET, MLH1*, MSH2*, MSH3, MSH6*, MUTYH*, NF1*, NF2, NTHL1, PALB2*, PHOX2B, PMS2*, POT1, PRKAR1A, PTCH1, PTEN*, RAD51C*, RAD51D*, RB1, RET, SDHA, SDHAF2, SDHB, SDHC, SDHD, SMAD4, SMARCA4, SMARCB1, SMARCE1, STK11, SUFU, TMEM127, TP53*, TSC1, TSC2, and VHL (sequencing and deletion/duplication); EGFR, EGLN1, HOXB13, KIT, MITF, PDGFRA, POLD1, and POLE (sequencing only); EPCAM and GREM1 (deletion/duplication only). DNA and RNA analyses performed for * genes. The test report has been scanned into EPIC and is located under the Molecular Pathology section of the Results Review tab.  A portion of the result report is included below for reference.     We discussed with [name] that because current genetic testing is not perfect, it is possible there may be a gene mutation in one of these genes that current testing cannot detect, but that chance is small.  We also discussed, that there  could be another gene that has not yet been discovered, or that we have not yet tested, that is responsible for the cancer diagnoses in the family. It is also possible there is a hereditary cause for the cancer in the family that [name] did not inherit and therefore was not identified in her testing.  Therefore, it is important to remain in touch with cancer genetics in the future so that we can continue to offer [name] the most up to date genetic testing.   ADDITIONAL GENETIC TESTING: We discussed with [name] that her genetic testing was fairly extensive.  If there are genes identified to increase cancer risk that can be analyzed in the future, we would be happy to discuss and coordinate this testing at that time.    CANCER SCREENING RECOMMENDATIONS: [name] test result is considered negative (normal).  This means that we have not identified a hereditary cause for her personal and family history of cancer at this time. Most  cancers happen by chance and this negative test suggests that her cancer may fall into this category.    While reassuring, this does not definitively rule out a hereditary predisposition to cancer. It is still possible that there could be genetic mutations that are undetectable by current technology. There could be genetic mutations in genes that have not been tested or identified to increase cancer risk.  Therefore, it is recommended she continue to follow the cancer management and screening guidelines provided by her oncology and primary healthcare provider.   An individual's cancer risk and medical management are not determined by genetic test results alone. Overall cancer risk assessment incorporates additional factors, including personal medical history, family history, and any available genetic information that may result in a personalized plan for cancer prevention and surveillance   RECOMMENDATIONS FOR FAMILY MEMBERS:  Individuals in this family might be at some increased risk of developing cancer, over the general population risk, simply due to the family history of cancer.  We recommended women in this family have a yearly mammogram beginning at age 73, or 62 years younger than the earliest onset of cancer, an annual clinical breast exam, and perform monthly breast self-exams. Women in this family should also have a gynecological exam as recommended by their primary provider. All family members should be referred for colonoscopy starting at age 38.  FOLLOW-UP: Lastly, we discussed with [name] that cancer genetics is a rapidly advancing field and it is possible that new genetic tests will be appropriate for her and/or her family members in the future. We encouraged her to remain in contact with cancer genetics on an annual basis so we can update her personal and family histories and let her know of advances in cancer genetics that may benefit this family.   Our contact number was provided. Ms.  Palmer questions were answered to her satisfaction, and she knows she is welcome to call us at anytime with additional questions or concerns.   Roma Kayser, [place], [facility] Licensed, Certified Genetic Counselor [email or web address]

## 2022-05-09 ENCOUNTER — Inpatient Hospital Stay: Payer: 59

## 2022-05-09 DIAGNOSIS — C569 Malignant neoplasm of unspecified ovary: Secondary | ICD-10-CM

## 2022-05-09 DIAGNOSIS — Z5111 Encounter for antineoplastic chemotherapy: Secondary | ICD-10-CM | POA: Diagnosis not present

## 2022-05-09 LAB — CBC WITH DIFFERENTIAL/PLATELET
Abs Immature Granulocytes: 5.77 10*3/uL — ABNORMAL HIGH (ref 0.00–0.07)
Basophils Absolute: 0.1 10*3/uL (ref 0.0–0.1)
Basophils Relative: 0 %
Eosinophils Absolute: 0.1 10*3/uL (ref 0.0–0.5)
Eosinophils Relative: 0 %
HCT: 28.1 % — ABNORMAL LOW (ref 36.0–46.0)
Hemoglobin: 9.3 g/dL — ABNORMAL LOW (ref 12.0–15.0)
Immature Granulocytes: 14 %
Lymphocytes Relative: 8 %
Lymphs Abs: 3.3 10*3/uL (ref 0.7–4.0)
MCH: 32 pg (ref 26.0–34.0)
MCHC: 33.1 g/dL (ref 30.0–36.0)
MCV: 96.6 fL (ref 80.0–100.0)
Monocytes Absolute: 5.7 10*3/uL — ABNORMAL HIGH (ref 0.1–1.0)
Monocytes Relative: 14 %
Neutro Abs: 25.9 10*3/uL — ABNORMAL HIGH (ref 1.7–7.7)
Neutrophils Relative %: 64 %
Platelets: 223 10*3/uL (ref 150–400)
RBC: 2.91 MIL/uL — ABNORMAL LOW (ref 3.87–5.11)
RDW: 21 % — ABNORMAL HIGH (ref 11.5–15.5)
WBC: 40.8 10*3/uL — ABNORMAL HIGH (ref 4.0–10.5)
nRBC: 0.8 % — ABNORMAL HIGH (ref 0.0–0.2)

## 2022-05-09 LAB — SAMPLE TO BLOOD BANK

## 2022-05-09 MED ORDER — SODIUM CHLORIDE 0.9% FLUSH
10.0000 mL | Freq: Once | INTRAVENOUS | Status: AC
  Administered 2022-05-09: 10 mL

## 2022-05-09 MED ORDER — HEPARIN SOD (PORK) LOCK FLUSH 100 UNIT/ML IV SOLN
500.0000 [IU] | Freq: Once | INTRAVENOUS | Status: AC
  Administered 2022-05-09: 500 [IU]

## 2022-05-09 NOTE — Progress Notes (Signed)
Pt labs within parameters. Pt reports feeling well, no dizziness, SOB, increase in fatigue. Pt declines need for blood or fluids today. PAC deaccessed, pt ambulatory to lobby.

## 2022-05-17 ENCOUNTER — Ambulatory Visit (HOSPITAL_COMMUNITY)
Admission: RE | Admit: 2022-05-17 | Discharge: 2022-05-17 | Disposition: A | Discharge status: Home / Self Care | Payer: 59 | Source: Ambulatory Visit | Attending: Hematology and Oncology | Admitting: Hematology and Oncology

## 2022-05-17 ENCOUNTER — Inpatient Hospital Stay: Payer: 59 | Attending: Gynecologic Oncology

## 2022-05-17 ENCOUNTER — Encounter (HOSPITAL_COMMUNITY): Payer: Self-pay

## 2022-05-17 ENCOUNTER — Other Ambulatory Visit: Payer: Self-pay

## 2022-05-17 DIAGNOSIS — Z932 Ileostomy status: Secondary | ICD-10-CM | POA: Diagnosis not present

## 2022-05-17 DIAGNOSIS — N939 Abnormal uterine and vaginal bleeding, unspecified: Secondary | ICD-10-CM | POA: Diagnosis not present

## 2022-05-17 DIAGNOSIS — Z23 Encounter for immunization: Secondary | ICD-10-CM | POA: Diagnosis not present

## 2022-05-17 DIAGNOSIS — N898 Other specified noninflammatory disorders of vagina: Secondary | ICD-10-CM | POA: Diagnosis not present

## 2022-05-17 DIAGNOSIS — R5383 Other fatigue: Secondary | ICD-10-CM | POA: Insufficient documentation

## 2022-05-17 DIAGNOSIS — C569 Malignant neoplasm of unspecified ovary: Secondary | ICD-10-CM | POA: Diagnosis not present

## 2022-05-17 DIAGNOSIS — Z9221 Personal history of antineoplastic chemotherapy: Secondary | ICD-10-CM | POA: Insufficient documentation

## 2022-05-17 LAB — CBC WITH DIFFERENTIAL/PLATELET
Abs Immature Granulocytes: 3.18 10*3/uL — ABNORMAL HIGH (ref 0.00–0.07)
Basophils Absolute: 0.2 10*3/uL — ABNORMAL HIGH (ref 0.0–0.1)
Basophils Relative: 1 %
Eosinophils Absolute: 0.1 10*3/uL (ref 0.0–0.5)
Eosinophils Relative: 0 %
HCT: 33.1 % — ABNORMAL LOW (ref 36.0–46.0)
Hemoglobin: 10.9 g/dL — ABNORMAL LOW (ref 12.0–15.0)
Immature Granulocytes: 10 %
Lymphocytes Relative: 9 %
Lymphs Abs: 2.6 10*3/uL (ref 0.7–4.0)
MCH: 32.3 pg (ref 26.0–34.0)
MCHC: 32.9 g/dL (ref 30.0–36.0)
MCV: 98.2 fL (ref 80.0–100.0)
Monocytes Absolute: 2.8 10*3/uL — ABNORMAL HIGH (ref 0.1–1.0)
Monocytes Relative: 9 %
Neutro Abs: 22.1 10*3/uL — ABNORMAL HIGH (ref 1.7–7.7)
Neutrophils Relative %: 71 %
Platelets: 354 10*3/uL (ref 150–400)
RBC: 3.37 MIL/uL — ABNORMAL LOW (ref 3.87–5.11)
RDW: 20.8 % — ABNORMAL HIGH (ref 11.5–15.5)
Smear Review: NORMAL
WBC: 31 10*3/uL — ABNORMAL HIGH (ref 4.0–10.5)
nRBC: 0.6 % — ABNORMAL HIGH (ref 0.0–0.2)

## 2022-05-17 LAB — SAMPLE TO BLOOD BANK

## 2022-05-17 MED ORDER — IOHEXOL 9 MG/ML PO SOLN
500.0000 mL | ORAL | Status: AC
  Administered 2022-05-17 (×2): 500 mL via ORAL

## 2022-05-17 MED ORDER — HEPARIN SOD (PORK) LOCK FLUSH 100 UNIT/ML IV SOLN
INTRAVENOUS | Status: AC
  Filled 2022-05-17: qty 5

## 2022-05-17 MED ORDER — HEPARIN SOD (PORK) LOCK FLUSH 100 UNIT/ML IV SOLN
500.0000 [IU] | Freq: Once | INTRAVENOUS | Status: AC
  Administered 2022-05-17: 500 [IU] via INTRAVENOUS

## 2022-05-17 MED ORDER — IOHEXOL 9 MG/ML PO SOLN
ORAL | Status: AC
  Filled 2022-05-17: qty 1000

## 2022-05-17 MED ORDER — IOHEXOL 300 MG/ML  SOLN
100.0000 mL | Freq: Once | INTRAMUSCULAR | Status: AC | PRN
  Administered 2022-05-17: 100 mL via INTRAVENOUS

## 2022-05-17 MED ORDER — SODIUM CHLORIDE (PF) 0.9 % IJ SOLN
INTRAMUSCULAR | Status: AC
  Filled 2022-05-17: qty 50

## 2022-05-20 ENCOUNTER — Telehealth: Payer: Self-pay

## 2022-05-20 NOTE — Telephone Encounter (Signed)
Notified Patient of completion of Attending Physician Statement-Progress Report. Copy of form placed for pick-up as requested. Request for medical records forwarded to [facility] Information Management Office. No other needs or concerns voiced at this time.

## 2022-05-23 ENCOUNTER — Encounter: Payer: Self-pay | Admitting: Gynecologic Oncology

## 2022-05-24 ENCOUNTER — Telehealth: Payer: Self-pay | Admitting: Oncology

## 2022-05-24 ENCOUNTER — Telehealth: Payer: Self-pay

## 2022-05-24 ENCOUNTER — Encounter: Payer: Self-pay | Admitting: Gynecologic Oncology

## 2022-05-24 DIAGNOSIS — C569 Malignant neoplasm of unspecified ovary: Secondary | ICD-10-CM

## 2022-05-24 NOTE — Telephone Encounter (Signed)
Left a message with CT with rectal contrast appointment for 06/13/22 at 3:00.  Requested a call back to confirm and discuss instructions.  Aleysha called back and confirmed CT on 4/29.  She was also wondering if Dr. Pricilla Holm can call her because she has some questions.  Advised I will reach out to Dr. Pricilla Holm and let her know.

## 2022-05-24 NOTE — Telephone Encounter (Signed)
Notified Patient of completion of Waiver of Premium form. Fax transmission confirmation received. Copy of form placed for pick-up as requested by Patient. No other needs or concerns voiced at this time.

## 2022-05-24 NOTE — Progress Notes (Signed)
Call the patient, answered questions about her CT scan. I am unable to look at the images at this time, but we discussed that the findings may represent changes after her surgery versus malignancy. She has not had imaging since prior to her surgery at the beginning of January. Most likely, we will follow these closely with repeat imaging in three months.  Unfortunately, she did not get rectal contrast with her recent CT scan. 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 Plan to get tumor markers at the time of her next scan.

## 2022-05-25 ENCOUNTER — Telehealth: Payer: Self-pay | Admitting: Oncology

## 2022-05-25 ENCOUNTER — Other Ambulatory Visit: Payer: Self-pay

## 2022-05-25 NOTE — Telephone Encounter (Signed)
Called [name] back and advised her that per Dr. Bertis Ruddy, it is best to avoid vaccine until her symptoms have recovered and that it can be reordered in the future.  [name] verbalized understanding and agreement and will call when she is feeling better.

## 2022-05-25 NOTE — Telephone Encounter (Signed)
Called [name] and advised her that CT has been moved up to 06/07/22 and that we will call her with an appointment to see Dr. Pricilla Holm on 4/25 or 4/26.  She verbalized agreement and said that she has a cold (no fever but a runny nose and cough) that she caught from her daughter.  She is wondering if it is OK to come in for her vaccinations tomorrow.

## 2022-05-26 ENCOUNTER — Inpatient Hospital Stay: Payer: 59 | Admitting: Gynecologic Oncology

## 2022-05-26 ENCOUNTER — Inpatient Hospital Stay: Payer: 59

## 2022-05-26 DIAGNOSIS — C569 Malignant neoplasm of unspecified ovary: Secondary | ICD-10-CM

## 2022-05-30 ENCOUNTER — Telehealth: Payer: Self-pay | Admitting: Oncology

## 2022-05-30 NOTE — Telephone Encounter (Signed)
Called [name] and advised her of appointment with Dr. Pricilla Holm on 06/09/22 at 8:00 to review her CT scan/discuss surgery.  Also advised Dr. Bertis Ruddy is going to reschedule her immunization for that day as well.  [name] verbalized understanding and agreement.

## 2022-06-07 ENCOUNTER — Inpatient Hospital Stay: Payer: 59

## 2022-06-07 ENCOUNTER — Other Ambulatory Visit: Payer: Self-pay

## 2022-06-07 ENCOUNTER — Ambulatory Visit (HOSPITAL_COMMUNITY)
Admission: RE | Admit: 2022-06-07 | Discharge: 2022-06-07 | Disposition: A | Discharge status: Home / Self Care | Payer: 59 | Source: Ambulatory Visit | Attending: Hematology and Oncology | Admitting: Hematology and Oncology

## 2022-06-07 ENCOUNTER — Other Ambulatory Visit: Payer: Self-pay | Admitting: Hematology and Oncology

## 2022-06-07 DIAGNOSIS — C569 Malignant neoplasm of unspecified ovary: Secondary | ICD-10-CM

## 2022-06-07 DIAGNOSIS — I700 Atherosclerosis of aorta: Secondary | ICD-10-CM | POA: Diagnosis not present

## 2022-06-07 DIAGNOSIS — N200 Calculus of kidney: Secondary | ICD-10-CM | POA: Diagnosis not present

## 2022-06-07 DIAGNOSIS — C561 Malignant neoplasm of right ovary: Secondary | ICD-10-CM | POA: Diagnosis not present

## 2022-06-07 MED ORDER — IOHEXOL 300 MG/ML  SOLN
25.0000 mL | Freq: Once | INTRAMUSCULAR | Status: AC | PRN
  Administered 2022-06-07: 25 mL

## 2022-06-08 NOTE — H&P (View-Only) (Signed)
Gynecologic Oncology Return Clinic Visit  06/09/22  Reason for Visit: treatment planning  Treatment History: Oncology History Overview Note  Neg genetics   Malignant germ cell tumor of ovary  12/01/2021 Tumor Marker   CA-125 is elevated at 285, [place] [address], beta-hCG is elevated at 116   12/02/2021 Imaging   MRI pelvis  1. 21.1 by 10.9 by 18.7 cm abdominopelvic mass, with a satellite mass in the right upper quadrant anteriorly, and moderate ascites with tumor deposits along the pelvic ascites posteriorly. The mass could be arising from right or left ovary, or both. The mass displaces the bowel and IVC, but there is no IVC thrombus or pelvic DVT identified. 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 2. Additional satellite mass above the liver is partially included, along with tumor deposits along the ascites. 3. Low-level edema along the lateral abdominal wall musculature. 4. Flattening of the IVC at the level of the aortic bifurcation due to the large abdominopelvic tumor. No findings of pelvic DVT.     12/07/2021 Imaging   CT abdomen and pelvis 1. Large central abdominal and pelvic mass consistent with known malignancy. This is incompletely visualized by this CT of the abdomen, but was seen on recent pelvic MRI. 2. 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 Right omental implant consistent with metastatic disease. Small amount of abdominal ascites. 3. No evidence for bowel or ureteral obstruction. 4. Aortic Atherosclerosis (ICD10-I70.0). 5. Chest findings dictated separately.   12/07/2021 Imaging   CT chest 1. Negative for pulmonary embolism. And no acute or metastatic process identified in the Chest. 2. Partially visible liver mass and ascites, staging CT Abdomen today is reported separately.     12/07/2021 Procedure   Successful placement  of a right internal jugular approach power injectable Port-A-Cath. The catheter is ready for immediate use.   12/07/2021 Procedure   Ultrasound-guided biopsy of right upper quadrant mass.    12/08/2021 Initial Diagnosis   Ovarian cancer (HCC)   12/08/2021 Cancer Staging   Staging form: Ovary, Fallopian Tube, and Primary Peritoneal Carcinoma, AJCC 8th Edition - Clinical stage from 12/08/2021: FIGO Stage IIIC (cT3c, cN0, cM0) - Signed by [name], MD on 12/10/2021 Stage prefix: Initial diagnosis   12/20/2021 - 12/28/2021 Chemotherapy   Patient is on Treatment Plan : Ovarian germ cell tumor q21d x 3 Cycles     01/03/2022 Imaging   IMPRESSION: 1. Large central abdominopelvic mass compatible with malignancy, 3450 cubic cm in volume. This may be arising from the ovaries with slight eccentricity to the right in the pelvis. 2. Mild increase in size of the right omental tumor deposit. 3. Mild increase in size of the subcapsular mass along the dome of the right hepatic lobe, with suspected hepatic invasion. 4. Scattered malignant ascites. 5. No dilated bowel to suggest obstruction. 6. 2 mm left kidney lower pole nonobstructive renal calculus. 7. Mild abdominal aortic atherosclerotic vascular disease.   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  Postoperative Diagnosis: Status post R0 cytoreductive surgery and heating intraperitoneal chemotherapy  Procedures: LAPAROSCOPY, ABDOMEN, PERITONEUM, & OMENTUM, DIAGNOSTIC, W/WO COLLECTION SPECIMEN(S) BY BRUSHING OR WASHING  EXCISION/DESTRUCTION, OPEN, INTRA-ABD TUMOR/CYST/ENDOMETRIOMA, 1+ PERITONEAL/MESENTERI/RETROPERIT 5CM OR <COLECTOMY, PARTIAL; WITH ANASTOMOSIS COLOSTOMY OR SKIN LEVEL CECOSTOMY SPLENECTOMY; TOT (SEPART PROC) EXPLORATORY LAPAROTOMY, EXPLORATORY CELIOTOMY WITH OR WITHOUT BIOPSY(S) HYPERTHERMIA, EXTERNALLY  GENERATED; DEEP CHEMOTHERAPY  ADMINISTRATION INTO THE PERITONEAL CAVITY VIA INDWELLING PORT OR CATHETER RESECTION (INITIAL) OVARIAN, TUBAL/PRIM PERITONEAL MALIG W/BIL S&O/OMENTECT; W/RAD DISSECTION FOR DEBULKING CHOLECYSTECTOMY APPENDECTOMY OMENTECTOMY, EPIPLOECTOMY, RESECTION OF OMENTUM Procedures: Exploratory laparotomy, total abdominal hysterectomy with radical dissection, bilateral ureterolysis, bilateral salpingo-oophorectomy, posterior cul-de-sac peritonectomy, tumor debulking by Dr Pricilla Holm with Gynecology Oncology Dr Vanessa Ralphs with surgical oncology: Diagnostic laparoscopy, exploratory laparotomy, low anterior resection with primary end-to-end anastomosis, enterolysis, cholecystectomy, tumor capsule tumor debulking, splenectomy, appendectomy, omentectomy, diverting loop ileostomy formation  Surgeon: Eugene Garnet, MD  Findings: On diagnostic laparoscopy, only able to clearly visualize the left upper quadrant. Adhesions of bowel in the right upper quadrant limiting survey, minimal ascites noted in the pelvis, scattered tumor plaques visualized on left diaphragm. Given these findings, the decision was made to convert to exploratory laparotomy. Multiple loops of small bowel encased in a Cancn like structure in the midline with multiple filmy adhesions tethering to the superior aspect of the a large pelvic mass. Multicystic and necrotic pelvic mass originating from left ovary, approximately 18 x 20 cm incorporated within the left broad ligament with the left tube draped overlying the left ovarian mass. Bilateral tubes normal in appearance, normal-appearing uterus with smooth serosa. Right adnexa similarly with approximate 10 x 8 multicystic and necrotic mass. Diffuse tumor plaques encompassing posterior cul-de-sac peritoneum. Rectosigmoid densely adhered to posterior aspect of left adnexal mass requiring dissection and ultimately resection. Scattered treated subcentimeter nodules along small bowel mesentery. 5 x 5 cm necrotic and  multicystic tumor at the level of the hepatic flexure. Enlarged similar multicystic necrotic tumor adherent between the right diaphragm and the liver without any evidence of liver invasion and tumor encased solely within the liver capsule. Multiple tumor nodules along the spleen requiring splenectomy. Omentum overall fairly normal in appearance with only scattered small tumor deposits, no omental cake was encountered. Please see surgical oncology operative note for further details regarding operative findings for their portion of the procedure.  Specimens: ID Type Source Tests Collected by Time Destination 1 : Abdominal fat pad Tissue Abdomen SURGICAL PATHOLOGY EXAM Marion Downer, MD 02/15/2022 [phone] 2 : Left adnexa Tissue Abdomen SURGICAL PATHOLOGY EXAM Marion Downer, MD 02/15/2022 [phone] 3 : right adnexa Tissue Abdomen SURGICAL PATHOLOGY EXAM Marion Downer, MD 02/15/2022 [phone] 4 : Left perimetrium Tissue Uterus SURGICAL PATHOLOGY EXAM Carver Fila, MD 02/15/2022 [phone] 5 : Uterus and cervix Tissue Uterus SURGICAL PATHOLOGY EXAM Marion Downer, MD 02/15/2022 1018 6 : Low Anterior Resection Tissue Abdomen SURGICAL PATHOLOGY EXAM Marion Downer, MD 02/15/2022 1055 7 : Right peritoneum Tissue Abdomen SURGICAL PATHOLOGY EXAM Marion Downer, MD 02/15/2022 1058 8 : Right retroperitoneal mass Tissue Abdomen SURGICAL PATHOLOGY EXAM Marion Downer, MD 02/15/2022 1101 9 : Omentum Tissue Omentum SURGICAL PATHOLOGY EXAM Marion Downer, MD 02/15/2022 1130 10 : Gallbladder Tissue Gallbladder SURGICAL PATHOLOGY EXAM Marion Downer, MD 02/15/2022 1143 11 : Right diaphragm Tissue Diaphragm SURGICAL PATHOLOGY EXAM Marion Downer, MD 02/15/2022 1143 12 : Left diaphragm nodule Tissue Diaphragm SURGICAL PATHOLOGY EXAM Marion Downer, MD 02/15/2022 1157 13 : Spleen Tissue Spleen SURGICAL PATHOLOGY EXAM Marion Downer, MD  02/15/2022 1204 14 : Appendix Tissue Appendix SURGICAL PATHOLOGY EXAM Marion Downer, MD 02/15/2022 1213 15 : Small bowel mesenteric nodules Tissue Small Bowel SURGICAL PATHOLOGY EXAM Marion Downer, MD 02/15/2022 1215 16 : Colon mesenteric nodule Tissue Colon SURGICAL PATHOLOGY EXAM Marion Downer, MD 02/15/2022 1221 17 : Hepatic flexure mass Tissue Liver  SURGICAL PATHOLOGY EXAM Marion Downer, MD 02/15/2022 1227  Complications: None  Indications for Procedure: [name] is a 52 y.o. 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 Prior to the procedure, all risks, benefits, and alternatives were discussed and informed surgical consent was signed.  Procedure: Patient was taken to the operating room where general anesthesia was achieved. She was positioned in dorsal lithotomy and prepped and draped per the surgical oncology team. A foley catheter was inserted into the bladder. A 5 mm skin incision was made in the left upper quadrant at Palmer's point, the abdomen was then entered with direct Optiview entry and the abdomen was insufflated. Findings were noted as above.  The laparoscope was then removed from the abdomen, the pneumoperitoneum was maintained. A vertical midline incision was made with the scalpel and the abdomen was entered sharply. The abdomen and pelvis were surveyed with findings as documented above. A wound protector was placed followed by the [facility] retractor.  The enlarged left adnexal mass was then carefully removed of its bilateral sidewall attachments using a mixture of blunt and cautery dissection. Multiple small bowel loops were adhered to the superior and posterior aspect of the mass with filmy adhesions which were carefully bluntly resected. The rectosigmoid was noted to be densely adhered to the posterior aspect of the pelvic mass. Dissection was attempted  using mixture of blunt dissection as well as sharp dissection with the Metzenbaum scissors, and unavoidable enterotomy of approximately 4 cm was made to the anterior surface of the rectosigmoid. This enterotomy was then closed with 4-0 PDS in a running fashion to minimize contamination. Further dissection removing the remainder of the rectosigmoid from the posterior aspect of the pelvic mass was then undertaken. The pelvic mass was then able to be fully elevated from the pelvis. The left infundibulopelvic ligament was able to be isolated with the left ureter clearly visualized below. The left ureter was further dissected away from the medial leaf of the broad ligament, and a vessel loop was placed. The left infundibulopelvic ligament was then isolated, clamped, and transected using the LigaSure device as well as and suture-ligated. The left utero-ovarian ligament was then isolated, clamped, cauterized and transected. The left pelvic mass was then removed entirely.  Attention was then turned to the right. An additional enlarged multicystic right adnexal mass was noted, this was carefully elevated from the pelvis using blunt dissection. The right round ligament was then grasped, elevated, and transected. The broad ligament was then opened posteriorly. The ureter was identified and the right infundibulopelvic ligament was then isolated, clamped, transected, and tied. The right utero ovarian ligament was then isolated, clamped, and transected using the LigaSure device and the right adnexal mass was removed.  Attention then was turned toward the hysterectomy. A sponge stick was placed within the vagina. The right broad ligament dissection was opened anteriorly and the bladder flap was developed. The left round ligament was then further transected, and the broad ligament opened posteriorly and anteriorly to complete the bladder flap. The right uterine artery was skeletonized, clamped, transected, and suture-ligated.  There was significant parametrial tumor involvement on the left side, and therefore a radical dissection was required to isolate the blood supply. The left ureter was further dissected down to the level near the trigone. The left uterine artery was traced to its origin, isolated, and clamped with vascular clips. Similarly the left uterine vein due  to significant necrotic tumor involvement was friable. Detachment was similarly carefully isolated, and clipped with vascular clips. Sequential clamps were then used to transect the remainder of the broad and cardinal ligaments bilaterally, with each pedicle being suture-ligated. A large right angle clamp was then able to be placed below the cervix, and the uterus and cervix were amputated from the vagina. The vaginal cuff was then closed with several figure-of-eight sutures using 0 Vicryl.    02/15/2022 Pathology Results   A: Abdominal fat pad, excision - Fibroadipose tissue with residual mixed malignant germ cell tumor with treatment effect   B: Left adnexa, salpingo-oophorectomy - Residual mixed malignant germ cell tumor with treatment effect, [id number] (See Comment and Synoptic report) - FIGO stage IIIC - Fallopian tubes with no tumor present   C: Right adnexa, salpingo-oophorectomy - Residual mixed malignant germ cell tumor with treatment effect (See Synoptic report) - Fallopian tubes not identified   D: Left parametrium, biopsy - Fibroadipose tissue with residual mixed malignant germ cell tumor with treatment effect   E: Uterus and cervix, hysterectomy - Right adnexal soft tissue with residual mixed malignant germ cell tumor with treatment effect   Myometrium: - Adenomyosis - Leiomyomata measuring up to 0.7 cm - Extensive chronic serositis   Cervix:  - Ectocervix and endocervix with chronic cervicitis    Endometrium: - Inactive endometrium   F: Low anterior resection - Residual mixed malignant  germ cell tumor with treatment effect  involving colonic serosa and pericolonic adipose tissue - Proximal and distal colonic resection margins with chronic serositis, but negative for tumor    G: Right peritoneum, biopsy - Fibroadipose tissue with residual mixed malignant germ cell tumor with treatment effect   H: Right retroperitoneal mass, excision - Fibroadipose tissue with residual mixed malignant germ cell tumor with treatment effect   I: Omentum, omentectomy - Fibroadipose tissue with residual mixed malignant germ cell tumor with treatment effect   J: Gallbladder, cholecystectomy - Gallbladder with mild chronic serositis and fibrous adhesion   K: Right diaphragm, excision - Fibrous tissue with residual mixed malignant germ cell tumor with treatment effect - Hepatic tissue with no tumor present   L: Left diaphragm nodule, biopsy - Fibrous tissue with residual mixed malignant germ cell tumor with treatment effect   M: Spleen, splenectomy - Residual mixed malignant germ cell tumor with treatment effect involving splenic capsule   N: Appendix, appendectomy - Appendix with fibrous obliteration at the tip - Periappendiceal adipose tissue with acute and chronic inflammation   O: Small bowel mesenteric nodules, excision - Fibroadipose tissue with residual mixed malignant germ cell tumor with treatment effect   P: Mesenteric nodules, excision - Fibroadipose tissue with residual mixed malignant germ cell tumor with treatment effect   Q: Hepatic flexure mass, excision - Fibroadipose tissue with residual mixed malignant germ cell tumor with treatment effect   R: Anvil and donut, biopsy - Benign segment of colon with acute and chronic inflammation and fibrous adhesion  SPECIMEN    Procedure:    Radical hysterectomy     Procedure:    Omentectomy     Procedure:    Peritoneal tumor debulking     Hysterectomy Type:    Abdominal     Specimen Integrity:          Right Ovary Integrity:    Capsule intact     Specimen  Integrity:          Left Ovary Integrity:    Capsule intact  TUMOR    Tumor Site:    Bilateral ovaries     Tumor Size:    Greatest Dimension (Centimeters): 19.5 cm     Histologic Type:    Mixed malignant germ cell tumor:  yolk sac tumor, mature teratoma, mature neural elements, immature neural tissue, and immature cartilage     Ovarian Surface Involvement:    Present, right and left     Other Tissue / Organ Involvement:    Right ovary     Other Tissue / Organ Involvement:    Left ovary     Other Tissue / Organ Involvement:    Right fallopian tube     Other Tissue / Organ Involvement:    Omentum     Other Tissue / Organ Involvement:    Diaphragm and splenic capsule     Largest Extrapelvic Peritoneal Focus:    Macroscopic (greater than 2 cm): Splenic capsule     Peritoneal / Ascitic Fluid Involvement:    Not submitted / unknown   REGIONAL LYMPH NODES     Regional Lymph Node Status:    Not applicable (no regional lymph nodes submitted or found)   DISTANT METASTASIS     Distant Site(s) Involved:    Diaphragm, splenic capsule   pTNM CLASSIFICATION (AJCC 8th Edition)     Reporting of pT, pN, and (when applicable) pM categories is based on information available to the pathologist at the time the report is issued. As per the AJCC (Chapter 1, 8th Ed.) it is the managing physician's responsibility to establish the final pathologic stage based upon all pertinent information, including but potentially not limited to this pathology report.     Modified Classification:    y     pT Category:    pT3c     pN Category:    pN not assigned (no nodes submitted or found)     pM Category:    pM1   FIGO STAGE     FIGO Stage:    IIIC   COMMENT:  The needle core biopsies show a heterogeneous combination of elements including abundant neural tissue with atypical and immature features. There are also atypical glandular epithelial structures and focal yolk sac elements with microcystic pattern.  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  Immunohistochemistry is performed for cytokeratin AE1/AE3, CD117, CD30, G FAP, glypican-3, OCT3/4, synaptophysin, S100 and CD30.     03/14/2022 -  Chemotherapy   Patient is on Treatment Plan : Malignant germ cell tumor of ovary D1-5 q21d     03/22/2022 Tumor Marker   Patient's tumor was tested for the following markers: AFP. Results of the tumor marker test revealed 7.9.   04/23/2022 Genetic Testing   Negative genetic testing on the CancerNext-Expanded+RNAinsight panel.  The report date is April 23, 2022.  The CancerNext-Expanded gene panel offered by [facility] and includes sequencing and rearrangement analysis for the following 77 genes: AIP, ALK, APC*, ATM*, AXIN2, BAP1, BARD1, BMPR1A, BRCA1*, BRCA2*, BRIP1*, CDC73, CDH1*, CDK4, CDKN1B, CDKN2A, CHEK2*, CTNNA1, DICER1, FH, FLCN, KIF1B, LZTR1, MAX, MEN1, MET, MLH1*, MSH2*, MSH3, MSH6*, MUTYH*, NF1*, NF2, NTHL1, PALB2*, PHOX2B, PMS2*, POT1, PRKAR1A, PTCH1, PTEN*, RAD51C*, RAD51D*, RB1, RET, SDHA, SDHAF2, SDHB, SDHC, SDHD, SMAD4, SMARCA4, SMARCB1, SMARCE1, STK11, SUFU, TMEM127, TP53*, TSC1, TSC2, and VHL (sequencing and deletion/duplication); EGFR, EGLN1, HOXB13, KIT, MITF, PDGFRA, POLD1, and POLE (sequencing only); EPCAM and GREM1 (deletion/duplication only). DNA and RNA analyses performed  for * genes.    05/17/2022 Imaging   CT ABDOMEN PELVIS W WO CONTRAST  Result Date: 05/17/2022 CLINICAL DATA:  History of ovarian cancer status post recent section and completion of chemotherapy 1-1/2 weeks ago. Evaluation of possible ileostomy reversal. EXAM: CT ABDOMEN AND PELVIS WITHOUT AND WITH CONTRAST TECHNIQUE: Multidetector CT imaging of the abdomen and pelvis was performed following the standard protocol before and following the bolus administration of intravenous contrast. RADIATION DOSE REDUCTION: This exam was performed according to the  departmental dose-optimization program which includes automated exposure control, adjustment of the mA and/or kV according to patient size and/or use of iterative reconstruction technique. CONTRAST:  OMNIPAQUE IOHEXOL 300 MG/ML  SOLN COMPARISON:  CT abdomen and pelvis dated 02/09/2022 FINDINGS: Lower chest: No focal consolidation or pulmonary nodule in the lung bases. No pleural effusion or pneumothorax demonstrated. Partially imaged heart size is normal. Hepatobiliary: Postsurgical changes of the right hepatic dome with irregular capsular retraction. Punctate adjacent focus of subdiaphragmatic calcification (11:32). Unchanged subcentimeter hypoattenuating focus along posterior segment 7 (4:18). No intra or extrahepatic biliary ductal dilation. Cholecystectomy. Pancreas: No focal lesions or main ductal dilation. Spleen: Splenectomy. Lobulated, enhancing soft tissue density along the left hemidiaphragm at the splenectomy site measures 1.5 x 1.0 cm (4:11). Adrenals/Urinary Tract: No adrenal nodules. No suspicious renal mass or hydronephrosis. Punctate nonobstructing left lower pole stones. No focal bladder wall thickening. Stomach/Bowel: Normal appearance of the stomach. Right upper quadrant ileostomy. Enteric contrast material is present within the medial limb of the ileostomy. Appendectomy. Vascular/Lymphatic: Aortic atherosclerosis. No enlarged abdominal or pelvic lymph nodes. Reproductive: Hysterectomy and bilateral salpingo-oophorectomy. Ill-defined soft tissue near the left vaginal (4:66). No adnexal masses. Other: Status post omentectomy. Ill-defined lateral peritoneal thickening, for example series 4 image 38 on the right and image 36 on the left associated with scattered foci of mesenteric nodularity, for example 7 x 5 mm (4:34). Musculoskeletal: No acute or abnormal lytic or blastic osseous lesions. Postsurgical changes of the anterior abdominal wall. IMPRESSION: 1. Extensive postsurgical changes of  the abdomen and pelvis. Right upper quadrant ileostomy with enteric contrast material present within the medial limb of the ileostomy. No evidence of obstruction. 2. Multifocal ill-defined soft tissue densities as described, indeterminate, may reflect postsurgical change versus residual tumor. 3. Lobulated, enhancing soft tissue density along the left hemidiaphragm at the splenectomy site may represent residual/recurrent splenic tissue versus residual tumor, although the enhancement pattern does not appear consistent with pre-surgical appearance of the malignancy. 4.  Aortic Atherosclerosis (ICD10-I70.0). Electronically Signed   By: Agustin Cree M.D.   On: 05/17/2022 16:43      06/09/2022 Imaging   CT ABDOMEN PELVIS WO CONTRAST  Result Date: 06/09/2022 CLINICAL DATA:  Ovarian cancer, status post chemotherapy. Rectal contrast administered to evaluate for anastomotic patency prior to reversal. EXAM: CT ABDOMEN AND PELVIS WITHOUT CONTRAST TECHNIQUE: Multidetector CT imaging of the abdomen and pelvis was performed following the standard protocol without IV contrast. RADIATION DOSE REDUCTION: This exam was performed according to the departmental dose-optimization program which includes automated exposure control, adjustment of the mA and/or kV according to patient size and/or use of iterative reconstruction technique. COMPARISON:  05/17/2022 FINDINGS: Lower chest: Lung bases are clear. Hepatobiliary: Unenhanced liver is unremarkable. Status post cholecystectomy. No intrahepatic or extrahepatic ductal dilatation. Pancreas: Within normal limits. 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 Adrenals/Urinary Tract: Adrenal glands are within normal limits. Right kidney is within normal limits. 3 mm nonobstructing left lower pole renal  calculus (series 2/image 22). No hydronephrosis. Bladder is within normal limits. Stomach/Bowel: Stomach is within normal limits. No evidence of bowel obstruction.  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 Status post left hemicolectomy with suture line in the lower pelvis (series 2/image 87). Rectal contrast confirms a patent anastomosis without evidence of leak. Vascular/Lymphatic: No evidence of abdominal aortic aneurysm. Atherosclerotic calcifications of the abdominal aorta and branch vessels. No suspicious abdominopelvic lymphadenopathy. Reproductive: Status post hysterectomy and suspected bilateral salpingo oophorectomy. Other: No abdominopelvic ascites. Mild peritoneal nodularity along the lateral left mid abdomen, with discrete 8 mm nodule (series 2/image 33), more conspicuous than on the prior. Mild nonspecific mesenteric stranding along the jejunal mesentery in central left mid abdomen (series 2/image 30). Mildly prominent soft tissue along the left pelvic cul-de-sac measuring up to 2.0 cm (series 2/image 59), grossly unchanged. Overall appearance suggests very mild peritoneal disease which is overall grossly unchanged. Musculoskeletal: Visualized osseous structures are within normal limits. 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 Rectal contrast confirms a patent anastomosis without evidence of leak. 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 Additional ancillary findings as above. Electronically Signed   By: Charline Bills M.D.   On: 06/09/2022 01:59   CT ABDOMEN PELVIS W WO CONTRAST  Result Date: 05/17/2022 CLINICAL DATA:  History of ovarian cancer status post recent section and completion of chemotherapy 1-1/2 weeks ago. Evaluation of possible ileostomy reversal. EXAM: CT ABDOMEN AND PELVIS WITHOUT AND WITH CONTRAST TECHNIQUE: Multidetector CT imaging of the abdomen and pelvis was performed following the standard protocol before and following the bolus administration of intravenous contrast. RADIATION DOSE REDUCTION: This exam was performed according to the  departmental dose-optimization program which includes automated exposure control, adjustment of the mA and/or kV according to patient size and/or use of iterative reconstruction technique. CONTRAST:  OMNIPAQUE IOHEXOL 300 MG/ML  SOLN COMPARISON:  CT abdomen and pelvis dated 02/09/2022 FINDINGS: Lower chest: No focal consolidation or pulmonary nodule in the lung bases. No pleural effusion or pneumothorax demonstrated. Partially imaged heart size is normal. Hepatobiliary: Postsurgical changes of the right hepatic dome with irregular capsular retraction. Punctate adjacent focus of subdiaphragmatic calcification (11:32). Unchanged subcentimeter hypoattenuating focus along posterior segment 7 (4:18). No intra or extrahepatic biliary ductal dilation. Cholecystectomy. Pancreas: No focal lesions or main ductal dilation. Spleen: Splenectomy. Lobulated, enhancing soft tissue density along the left hemidiaphragm at the splenectomy site measures 1.5 x 1.0 cm (4:11). Adrenals/Urinary Tract: No adrenal nodules. No suspicious renal mass or hydronephrosis. Punctate nonobstructing left lower pole stones. No focal bladder wall thickening. Stomach/Bowel: Normal appearance of the stomach. Right upper quadrant ileostomy. Enteric contrast material is present within the medial limb of the ileostomy. Appendectomy. Vascular/Lymphatic: Aortic atherosclerosis. No enlarged abdominal or pelvic lymph nodes. Reproductive: Hysterectomy and bilateral salpingo-oophorectomy. Ill-defined soft tissue near the left vaginal (4:66). No adnexal masses. Other: Status post omentectomy. Ill-defined lateral peritoneal thickening, for example series 4 image 38 on the right and image 36 on the left associated with scattered foci of mesenteric nodularity, for example 7 x 5 mm (4:34). Musculoskeletal: No acute or abnormal lytic or blastic osseous lesions. Postsurgical changes of the anterior abdominal wall. IMPRESSION: 1. Extensive postsurgical changes of  the abdomen and pelvis. Right upper quadrant ileostomy with enteric contrast material present within the medial limb of the ileostomy. No evidence of obstruction. 2. Multifocal ill-defined soft tissue densities as described, indeterminate, may reflect postsurgical change versus residual tumor. 3. Lobulated, enhancing  soft tissue density along the left hemidiaphragm at the splenectomy site may represent residual/recurrent splenic tissue versus residual tumor, although the enhancement pattern does not appear consistent with pre-surgical appearance of the malignancy. 4.  Aortic Atherosclerosis (ICD10-I70.0). Electronically Signed   By: Agustin Cree M.D.   On: 05/17/2022 16:43       Received C3 of adjuvant etoposide and cisplatin on 04/29/22.  Interval History: Patient reports overall doing well.  Fatigue has improved, not to baseline.  Reports good functioning of her ileostomy.  Denies any bladder symptoms.  Endorses good appetite without nausea or emesis.  Denies abdominal pain.  Since the weekend, has noted some light vaginal discharge as well as spotting, which is new for her.  Past Medical/Surgical History: Past Medical History:  Diagnosis Date   Arthritis    Endometriosis    Family history of breast cancer    Family history of breast cancer in mother    at age 51   GAD (generalized anxiety disorder)    lexapro helped 2017 (Dr. Emmit Alexanders did have extra stress of the Master's degree program on her at that time.  Got counseling.       GERD (gastroesophageal reflux disease)    Gestational diabetes    Kidney stones    Low back pain    Dr. Terrilee Files.   Neck pain    and left shoulder pain: managed by osteopathic manipulation by Dr. Terrilee Files. ?cervical radiculopathy   ovarian ca 12/01/2021   PAC (premature atrial contraction)     Past Surgical History:  Procedure Laterality Date   ABDOMINAL EXPLORATION SURGERY  2009   fulguration of endometriosis - [facility] surgery   IR IMAGING GUIDED PORT  INSERTION  12/08/2021   IR US GUIDE BX ASP/DRAIN  12/08/2021   SALPINGECTOMY Right 2009   TONSILLECTOMY     TUBAL LIGATION Left 2009    Family History  Problem Relation Age of Onset   Rheum arthritis Mother    Stroke Mother    Hypertension Mother    Heart disease Mother    Arthritis Mother    Breast cancer Mother 43   Heart attack Mother 6   Diabetes Father    Heart disease Father    Hyperlipidemia Father    Hypertension Father    Heart attack Father 48   Non-Hodgkin's lymphoma Paternal Uncle 53   Heart disease Maternal Grandmother    Heart attack Maternal Grandmother 6   Arthritis Maternal Grandmother    Early death Maternal Grandfather 30       MVA   Diabetes Paternal Grandmother    Hypertension Paternal Grandmother    Heart disease Paternal Grandmother    Heart attack Paternal Grandmother 41   Heart disease Paternal Grandfather    Heart attack Paternal Grandfather 78   Coronary artery disease Other    Glaucoma Other     Social History   Socioeconomic History   Marital status: Married    Spouse name: Not on file   Number of children: 3   Years of education: Not on file   Highest education level: Not on file  Occupational History   Occupation: Academic librarian: [employer]    Comment: at [employer]   Tobacco Use   Smoking status: Never   Smokeless tobacco: Never  Vaping Use   Vaping Use: Never used  Substance and Sexual Activity   Alcohol use: Yes    Comment: Occasionally   Drug use: No  Sexual activity: Yes    Partners: Male    Birth control/protection: Surgical  Other Topics Concern   Not on file  Social History Narrative   Married, 3 kids.    Lives in [place].   Educ: Masters deg nursing.   Pt works as a Systems developer at [employer] with husband Minerva Areola and three kids (Will, [place], and [place]).   No tob/drugs.   Alc: social.   Social Determinants of Health   Financial Resource Strain: Not on file  Food Insecurity: No Food  Insecurity (01/05/2022)   Hunger Vital Sign    Worried About Running Out of Food in the Last Year: Never true    Ran Out of Food in the Last Year: Never true  Transportation Needs: No Transportation Needs (01/05/2022)   PRAPARE - Administrator, Civil Service (Medical): No    Lack of Transportation (Non-Medical): No  Physical Activity: Not on file  Stress: Not on file  Social Connections: Not on file    Current Medications:  Current Outpatient Medications:    acetaminophen (TYLENOL) 650 MG CR tablet, Take 650 mg by mouth every 8 (eight) hours as needed for pain., Disp: , Rfl:    cholecalciferol (VITAMIN D3) 25 MCG (1000 UNIT) tablet, Take 2,000 Units by mouth daily., Disp: , Rfl:    famotidine (PEPCID) 10 MG tablet, Take 10 mg by mouth daily as needed for heartburn or indigestion., Disp: , Rfl:    LORazepam (ATIVAN) 0.5 MG tablet, Take 1 tablet (0.5 mg total) by mouth every 8 (eight) hours., Disp: 30 tablet, Rfl: 0   pregabalin (LYRICA) 25 MG capsule, Take 1 capsule (25 mg) by mouth at bedtime., Disp: 90 capsule, Rfl: 1   traMADol (ULTRAM) 50 MG tablet, Take 1 tablet (50 mg total) by mouth every 6 (six) hours as needed for severe pain., Disp: 30 tablet, Rfl: 0  Review of Systems: Denies appetite changes, fevers, chills, fatigue, unexplained weight changes. Denies hearing loss, neck lumps or masses, mouth sores, ringing in ears or voice changes. Denies cough or wheezing.  Denies shortness of breath. Denies chest pain or palpitations. Denies leg swelling. Denies abdominal distention, pain, blood in stools, constipation, diarrhea, nausea, vomiting, or early satiety. Denies pain with intercourse, dysuria, frequency, hematuria or incontinence. Denies hot flashes, pelvic pain.   Denies joint pain, back pain or muscle pain/cramps. Denies itching, rash, or wounds. Denies dizziness, headaches, numbness or seizures. Denies swollen lymph nodes or glands, denies easy bruising or  bleeding. Denies anxiety, depression, confusion, or decreased concentration.  Physical Exam: BP (!) 142/90 (BP Location: Right Arm, Patient Position: Sitting)   Pulse (!) 141 Comment: patient was upset from an incident from this morning  Temp 98.5 F (36.9 C) (Oral)   Wt 107 lb (48.5 kg)   SpO2 100%   BMI 21.61 kg/m  Repeat pulse at end of visit: 107. BP was 122/65. General: Alert, oriented, no acute distress. HEENT: Normocephalic, atraumatic, sclera anicteric. Chest: Clear to auscultation bilaterally.  No wheezes or rhonchi. Cardiovascular: Tachycardic with heart rate in the 110s, regular rhythm, no murmurs. Abdomen: soft, nontender.  Normoactive bowel sounds.  No masses or hepatosplenomegaly appreciated.  Well-healed incision. Extremities: Grossly normal range of motion.  Warm, well perfused.  No edema bilaterally. Skin: No rashes or lesions noted. Lymphatics: No cervical, supraclavicular, or inguinal adenopathy. GU: Normal appearing external genitalia without erythema, excoriation, or lesions.  Speculum exam reveals mildly atrophic vaginal mucosa.  Cuff  is intact.  There is a 3-4 mm polypoid lesion along the midportion of the cuff, mildly friable.  Otherwise, no lesions visible.  Bimanual exam reveals smooth cuff (performed after biopsy).  Rectovaginal exam deferred..  Vaginal cuff biopsy procedure Preoperative diagnosis: vaginal cuff lesion Postoperative diagnosis: Same as above Physician: Pricilla Holm MD Estimated blood loss: Minimal Specimens: Vaginal cuff biopsy Procedure:  She was then placed in dorsolithotomy position and a speculum was placed in the vagina.  Once the vaginal cuff had been inspected, recommendation was made for vaginal cuff biopsy.  This was discussed with the patient who gave verbal consent.  The vaginal cuff was cleansed with with Betadine x3.  A Tischler forcep was used to biopsy the lesion along the mid-portion of the cuff.  This was placed in formalin.  Overall  the patient tolerated the procedure well.  All instruments were removed from the vagina.  Laboratory & Radiologic Studies: CT Pelvis 06/07/22: Status post left hemicolectomy with diverting ileostomy in the right mid abdomen. Rectal contrast confirms a patent anastomosis without evidence of leak. 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 Additional ancillary findings as above.  CT A/P on 05/17/22: 1. Extensive postsurgical changes of the abdomen and pelvis. Right upper quadrant ileostomy with enteric contrast material present within the medial limb of the ileostomy. No evidence of obstruction. 2. Multifocal ill-defined soft tissue densities as described, indeterminate, may reflect postsurgical change versus residual tumor. 3. Lobulated, enhancing soft tissue density along the left hemidiaphragm at the splenectomy site may represent residual/recurrent splenic tissue versus residual tumor, although the enhancement pattern does not appear consistent with pre-surgical appearance of the malignancy. 4.  Aortic Atherosclerosis (ICD10-I70.0).  AFP  4/23: 3.2 2/5: 7.9 12/28: 173 01/09/22: 8,546 At diagnosis: 42,699  Assessment & Plan: [name] is a 52 y.o. 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  Now has completed 3 cycles of adjuvant chemotherapy with etoposide and cisplatin.  Patient is overall doing well.  Vaginal spotting appears to be in the setting of a vaginal cuff lesion, which may be granulation tissue.  Biopsy performed today to rule out malignancy.  Recent CT scan shows rectosigmoid anastomosis has healed well.  Discussed plan for reversal next month.  Also reviewed CT scan findings which are hard to interpret in the setting of large surgery and HIPEC.  It may be that these are postoperative changes.  We will get close  follow-up imaging in 3 months to assess for any change.  AFP from this week is normal for the first time.  CA125 is pending.  We reviewed the plan for a Ileostomy reversal, possible laparotomy. The risks of surgery were discussed in detail and she understands these to include infection; wound separation; hernia; injury to adjacent organs; bleeding which may require blood transfusion; anesthesia risk; thromboembolic events; possible death; unforeseen complications; possible need for re-exploration; medical complications such as heart attack, stroke, pleural effusion and pneumonia. The patient will receive DVT and antibiotic prophylaxis as indicated. She voiced a clear understanding. She had the opportunity to ask questions. Perioperative instructions were reviewed with her. Prescriptions for post-op medications were sent to her pharmacy of choice.  28 minutes of total time was spent for this patient encounter, including preparation, face-to-face counseling with the patient and coordination of care, and documentation of the encounter.  Eugene Garnet, MD  Division of Gynecologic Oncology  Department of Obstetrics and Gynecology  [facility]   

## 2022-06-08 NOTE — Progress Notes (Unsigned)
Gynecologic Oncology Return Clinic Visit  06/09/22  Reason for Visit: treatment planning  Treatment History: Oncology History Overview Note  Neg genetics   Malignant germ cell tumor of ovary  12/01/2021 Tumor Marker   CA-125 is elevated at 285, [place] [address], beta-hCG is elevated at 116   12/02/2021 Imaging   MRI pelvis  1. 21.1 by 10.9 by 18.7 cm abdominopelvic mass, with a satellite mass in the right upper quadrant anteriorly, and moderate ascites with tumor deposits along the pelvic ascites posteriorly. The mass could be arising from right or left ovary, or both. The mass displaces the bowel and IVC, but there is no IVC thrombus or pelvic DVT identified. 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 2. Additional satellite mass above the liver is partially included, along with tumor deposits along the ascites. 3. Low-level edema along the lateral abdominal wall musculature. 4. Flattening of the IVC at the level of the aortic bifurcation due to the large abdominopelvic tumor. No findings of pelvic DVT.     12/07/2021 Imaging   CT abdomen and pelvis 1. Large central abdominal and pelvic mass consistent with known malignancy. This is incompletely visualized by this CT of the abdomen, but was seen on recent pelvic MRI. 2. 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 Right omental implant consistent with metastatic disease. Small amount of abdominal ascites. 3. No evidence for bowel or ureteral obstruction. 4. Aortic Atherosclerosis (ICD10-I70.0). 5. Chest findings dictated separately.   12/07/2021 Imaging   CT chest 1. Negative for pulmonary embolism. And no acute or metastatic process identified in the Chest. 2. Partially visible liver mass and ascites, staging CT Abdomen today is reported separately.     12/07/2021 Procedure   Successful placement  of a right internal jugular approach power injectable Port-A-Cath. The catheter is ready for immediate use.   12/07/2021 Procedure   Ultrasound-guided biopsy of right upper quadrant mass.    12/08/2021 Initial Diagnosis   Ovarian cancer (HCC)   12/08/2021 Cancer Staging   Staging form: Ovary, Fallopian Tube, and Primary Peritoneal Carcinoma, AJCC 8th Edition - Clinical stage from 12/08/2021: FIGO Stage IIIC (cT3c, cN0, cM0) - Signed by [name], MD on 12/10/2021 Stage prefix: Initial diagnosis   12/20/2021 - 12/28/2021 Chemotherapy   Patient is on Treatment Plan : Ovarian germ cell tumor q21d x 3 Cycles     01/03/2022 Imaging   IMPRESSION: 1. Large central abdominopelvic mass compatible with malignancy, 3450 cubic cm in volume. This may be arising from the ovaries with slight eccentricity to the right in the pelvis. 2. Mild increase in size of the right omental tumor deposit. 3. Mild increase in size of the subcapsular mass along the dome of the right hepatic lobe, with suspected hepatic invasion. 4. Scattered malignant ascites. 5. No dilated bowel to suggest obstruction. 6. 2 mm left kidney lower pole nonobstructive renal calculus. 7. Mild abdominal aortic atherosclerotic vascular disease.   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  Postoperative Diagnosis: Status post R0 cytoreductive surgery and heating intraperitoneal chemotherapy  Procedures: LAPAROSCOPY, ABDOMEN, PERITONEUM, & OMENTUM, DIAGNOSTIC, W/WO COLLECTION SPECIMEN(S) BY BRUSHING OR WASHING  EXCISION/DESTRUCTION, OPEN, INTRA-ABD TUMOR/CYST/ENDOMETRIOMA, 1+ PERITONEAL/MESENTERI/RETROPERIT 5CM OR <COLECTOMY, PARTIAL; WITH ANASTOMOSIS COLOSTOMY OR SKIN LEVEL CECOSTOMY SPLENECTOMY; TOT (SEPART PROC) EXPLORATORY LAPAROTOMY, EXPLORATORY CELIOTOMY WITH OR WITHOUT BIOPSY(S) HYPERTHERMIA, EXTERNALLY  GENERATED; DEEP CHEMOTHERAPY  ADMINISTRATION INTO THE PERITONEAL CAVITY VIA INDWELLING PORT OR CATHETER RESECTION (INITIAL) OVARIAN, TUBAL/PRIM PERITONEAL MALIG W/BIL S&O/OMENTECT; W/RAD DISSECTION FOR DEBULKING CHOLECYSTECTOMY APPENDECTOMY OMENTECTOMY, EPIPLOECTOMY, RESECTION OF OMENTUM Procedures: Exploratory laparotomy, total abdominal hysterectomy with radical dissection, bilateral ureterolysis, bilateral salpingo-oophorectomy, posterior cul-de-sac peritonectomy, tumor debulking by Dr Pricilla Holm with Gynecology Oncology Dr Vanessa Ralphs with surgical oncology: Diagnostic laparoscopy, exploratory laparotomy, low anterior resection with primary end-to-end anastomosis, enterolysis, cholecystectomy, tumor capsule tumor debulking, splenectomy, appendectomy, omentectomy, diverting loop ileostomy formation  Surgeon: Eugene Garnet, MD  Findings: On diagnostic laparoscopy, only able to clearly visualize the left upper quadrant. Adhesions of bowel in the right upper quadrant limiting survey, minimal ascites noted in the pelvis, scattered tumor plaques visualized on left diaphragm. Given these findings, the decision was made to convert to exploratory laparotomy. Multiple loops of small bowel encased in a Cancn like structure in the midline with multiple filmy adhesions tethering to the superior aspect of the a large pelvic mass. Multicystic and necrotic pelvic mass originating from left ovary, approximately 18 x 20 cm incorporated within the left broad ligament with the left tube draped overlying the left ovarian mass. Bilateral tubes normal in appearance, normal-appearing uterus with smooth serosa. Right adnexa similarly with approximate 10 x 8 multicystic and necrotic mass. Diffuse tumor plaques encompassing posterior cul-de-sac peritoneum. Rectosigmoid densely adhered to posterior aspect of left adnexal mass requiring dissection and ultimately resection. Scattered treated subcentimeter nodules along small bowel mesentery. 5 x 5 cm necrotic and  multicystic tumor at the level of the hepatic flexure. Enlarged similar multicystic necrotic tumor adherent between the right diaphragm and the liver without any evidence of liver invasion and tumor encased solely within the liver capsule. Multiple tumor nodules along the spleen requiring splenectomy. Omentum overall fairly normal in appearance with only scattered small tumor deposits, no omental cake was encountered. Please see surgical oncology operative note for further details regarding operative findings for their portion of the procedure.  Specimens: ID Type Source Tests Collected by Time Destination 1 : Abdominal fat pad Tissue Abdomen SURGICAL PATHOLOGY EXAM Marion Downer, MD 02/15/2022 [phone] 2 : Left adnexa Tissue Abdomen SURGICAL PATHOLOGY EXAM Marion Downer, MD 02/15/2022 [phone] 3 : right adnexa Tissue Abdomen SURGICAL PATHOLOGY EXAM Marion Downer, MD 02/15/2022 [phone] 4 : Left perimetrium Tissue Uterus SURGICAL PATHOLOGY EXAM Carver Fila, MD 02/15/2022 [phone] 5 : Uterus and cervix Tissue Uterus SURGICAL PATHOLOGY EXAM Marion Downer, MD 02/15/2022 1018 6 : Low Anterior Resection Tissue Abdomen SURGICAL PATHOLOGY EXAM Marion Downer, MD 02/15/2022 1055 7 : Right peritoneum Tissue Abdomen SURGICAL PATHOLOGY EXAM Marion Downer, MD 02/15/2022 1058 8 : Right retroperitoneal mass Tissue Abdomen SURGICAL PATHOLOGY EXAM Marion Downer, MD 02/15/2022 1101 9 : Omentum Tissue Omentum SURGICAL PATHOLOGY EXAM Marion Downer, MD 02/15/2022 1130 10 : Gallbladder Tissue Gallbladder SURGICAL PATHOLOGY EXAM Marion Downer, MD 02/15/2022 1143 11 : Right diaphragm Tissue Diaphragm SURGICAL PATHOLOGY EXAM Marion Downer, MD 02/15/2022 1143 12 : Left diaphragm nodule Tissue Diaphragm SURGICAL PATHOLOGY EXAM Marion Downer, MD 02/15/2022 1157 13 : Spleen Tissue Spleen SURGICAL PATHOLOGY EXAM Marion Downer, MD  02/15/2022 1204 14 : Appendix Tissue Appendix SURGICAL PATHOLOGY EXAM Marion Downer, MD 02/15/2022 1213 15 : Small bowel mesenteric nodules Tissue Small Bowel SURGICAL PATHOLOGY EXAM Marion Downer, MD 02/15/2022 1215 16 : Colon mesenteric nodule Tissue Colon SURGICAL PATHOLOGY EXAM Marion Downer, MD 02/15/2022 1221 17 : Hepatic flexure mass Tissue Liver  SURGICAL PATHOLOGY EXAM Marion Downer, MD 02/15/2022 1227  Complications: None  Indications for Procedure: Shataria Crist is a 52 y.o. 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 Prior to the procedure, all risks, benefits, and alternatives were discussed and informed surgical consent was signed.  Procedure: Patient was taken to the operating room where general anesthesia was achieved. She was positioned in dorsal lithotomy and prepped and draped per the surgical oncology team. A foley catheter was inserted into the bladder. A 5 mm skin incision was made in the left upper quadrant at Palmer's point, the abdomen was then entered with direct Optiview entry and the abdomen was insufflated. Findings were noted as above.  The laparoscope was then removed from the abdomen, the pneumoperitoneum was maintained. A vertical midline incision was made with the scalpel and the abdomen was entered sharply. The abdomen and pelvis were surveyed with findings as documented above. A wound protector was placed followed by the [facility] retractor.  The enlarged left adnexal mass was then carefully removed of its bilateral sidewall attachments using a mixture of blunt and cautery dissection. Multiple small bowel loops were adhered to the superior and posterior aspect of the mass with filmy adhesions which were carefully bluntly resected. The rectosigmoid was noted to be densely adhered to the posterior aspect of the pelvic mass. Dissection was attempted  using mixture of blunt dissection as well as sharp dissection with the Metzenbaum scissors, and unavoidable enterotomy of approximately 4 cm was made to the anterior surface of the rectosigmoid. This enterotomy was then closed with 4-0 PDS in a running fashion to minimize contamination. Further dissection removing the remainder of the rectosigmoid from the posterior aspect of the pelvic mass was then undertaken. The pelvic mass was then able to be fully elevated from the pelvis. The left infundibulopelvic ligament was able to be isolated with the left ureter clearly visualized below. The left ureter was further dissected away from the medial leaf of the broad ligament, and a vessel loop was placed. The left infundibulopelvic ligament was then isolated, clamped, and transected using the LigaSure device as well as and suture-ligated. The left utero-ovarian ligament was then isolated, clamped, cauterized and transected. The left pelvic mass was then removed entirely.  Attention was then turned to the right. An additional enlarged multicystic right adnexal mass was noted, this was carefully elevated from the pelvis using blunt dissection. The right round ligament was then grasped, elevated, and transected. The broad ligament was then opened posteriorly. The ureter was identified and the right infundibulopelvic ligament was then isolated, clamped, transected, and tied. The right utero ovarian ligament was then isolated, clamped, and transected using the LigaSure device and the right adnexal mass was removed.  Attention then was turned toward the hysterectomy. A sponge stick was placed within the vagina. The right broad ligament dissection was opened anteriorly and the bladder flap was developed. The left round ligament was then further transected, and the broad ligament opened posteriorly and anteriorly to complete the bladder flap. The right uterine artery was skeletonized, clamped, transected, and suture-ligated.  There was significant parametrial tumor involvement on the left side, and therefore a radical dissection was required to isolate the blood supply. The left ureter was further dissected down to the level near the trigone. The left uterine artery was traced to its origin, isolated, and clamped with vascular clips. Similarly the left uterine vein due  to significant necrotic tumor involvement was friable. Detachment was similarly carefully isolated, and clipped with vascular clips. Sequential clamps were then used to transect the remainder of the broad and cardinal ligaments bilaterally, with each pedicle being suture-ligated. A large right angle clamp was then able to be placed below the cervix, and the uterus and cervix were amputated from the vagina. The vaginal cuff was then closed with several figure-of-eight sutures using 0 Vicryl.    02/15/2022 Pathology Results   A: Abdominal fat pad, excision - Fibroadipose tissue with residual mixed malignant germ cell tumor with treatment effect   B: Left adnexa, salpingo-oophorectomy - Residual mixed malignant germ cell tumor with treatment effect, [id number] (See Comment and Synoptic report) - FIGO stage IIIC - Fallopian tubes with no tumor present   C: Right adnexa, salpingo-oophorectomy - Residual mixed malignant germ cell tumor with treatment effect (See Synoptic report) - Fallopian tubes not identified   D: Left parametrium, biopsy - Fibroadipose tissue with residual mixed malignant germ cell tumor with treatment effect   E: Uterus and cervix, hysterectomy - Right adnexal soft tissue with residual mixed malignant germ cell tumor with treatment effect   Myometrium: - Adenomyosis - Leiomyomata measuring up to 0.7 cm - Extensive chronic serositis   Cervix:  - Ectocervix and endocervix with chronic cervicitis    Endometrium: - Inactive endometrium   F: Low anterior resection - Residual mixed malignant  germ cell tumor with treatment effect  involving colonic serosa and pericolonic adipose tissue - Proximal and distal colonic resection margins with chronic serositis, but negative for tumor    G: Right peritoneum, biopsy - Fibroadipose tissue with residual mixed malignant germ cell tumor with treatment effect   H: Right retroperitoneal mass, excision - Fibroadipose tissue with residual mixed malignant germ cell tumor with treatment effect   I: Omentum, omentectomy - Fibroadipose tissue with residual mixed malignant germ cell tumor with treatment effect   J: Gallbladder, cholecystectomy - Gallbladder with mild chronic serositis and fibrous adhesion   K: Right diaphragm, excision - Fibrous tissue with residual mixed malignant germ cell tumor with treatment effect - Hepatic tissue with no tumor present   L: Left diaphragm nodule, biopsy - Fibrous tissue with residual mixed malignant germ cell tumor with treatment effect   M: Spleen, splenectomy - Residual mixed malignant germ cell tumor with treatment effect involving splenic capsule   N: Appendix, appendectomy - Appendix with fibrous obliteration at the tip - Periappendiceal adipose tissue with acute and chronic inflammation   O: Small bowel mesenteric nodules, excision - Fibroadipose tissue with residual mixed malignant germ cell tumor with treatment effect   P: Mesenteric nodules, excision - Fibroadipose tissue with residual mixed malignant germ cell tumor with treatment effect   Q: Hepatic flexure mass, excision - Fibroadipose tissue with residual mixed malignant germ cell tumor with treatment effect   R: Anvil and donut, biopsy - Benign segment of colon with acute and chronic inflammation and fibrous adhesion  SPECIMEN    Procedure:    Radical hysterectomy     Procedure:    Omentectomy     Procedure:    Peritoneal tumor debulking     Hysterectomy Type:    Abdominal     Specimen Integrity:          Right Ovary Integrity:    Capsule intact     Specimen  Integrity:          Left Ovary Integrity:    Capsule intact  TUMOR    Tumor Site:    Bilateral ovaries     Tumor Size:    Greatest Dimension (Centimeters): 19.5 cm     Histologic Type:    Mixed malignant germ cell tumor:  yolk sac tumor, mature teratoma, mature neural elements, immature neural tissue, and immature cartilage     Ovarian Surface Involvement:    Present, right and left     Other Tissue / Organ Involvement:    Right ovary     Other Tissue / Organ Involvement:    Left ovary     Other Tissue / Organ Involvement:    Right fallopian tube     Other Tissue / Organ Involvement:    Omentum     Other Tissue / Organ Involvement:    Diaphragm and splenic capsule     Largest Extrapelvic Peritoneal Focus:    Macroscopic (greater than 2 cm): Splenic capsule     Peritoneal / Ascitic Fluid Involvement:    Not submitted / unknown   REGIONAL LYMPH NODES     Regional Lymph Node Status:    Not applicable (no regional lymph nodes submitted or found)   DISTANT METASTASIS     Distant Site(s) Involved:    Diaphragm, splenic capsule   pTNM CLASSIFICATION (AJCC 8th Edition)     Reporting of pT, pN, and (when applicable) pM categories is based on information available to the pathologist at the time the report is issued. As per the AJCC (Chapter 1, 8th Ed.) it is the managing physician's responsibility to establish the final pathologic stage based upon all pertinent information, including but potentially not limited to this pathology report.     Modified Classification:    y     pT Category:    pT3c     pN Category:    pN not assigned (no nodes submitted or found)     pM Category:    pM1   FIGO STAGE     FIGO Stage:    IIIC   COMMENT:  The needle core biopsies show a heterogeneous combination of elements including abundant neural tissue with atypical and immature features. There are also atypical glandular epithelial structures and focal yolk sac elements with microcystic pattern.  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  Immunohistochemistry is performed for cytokeratin AE1/AE3, CD117, CD30, G FAP, glypican-3, OCT3/4, synaptophysin, S100 and CD30.     03/14/2022 -  Chemotherapy   Patient is on Treatment Plan : Malignant germ cell tumor of ovary D1-5 q21d     03/22/2022 Tumor Marker   Patient's tumor was tested for the following markers: AFP. Results of the tumor marker test revealed 7.9.   04/23/2022 Genetic Testing   Negative genetic testing on the CancerNext-Expanded+RNAinsight panel.  The report date is April 23, 2022.  The CancerNext-Expanded gene panel offered by [facility] and includes sequencing and rearrangement analysis for the following 77 genes: AIP, ALK, APC*, ATM*, AXIN2, BAP1, BARD1, BMPR1A, BRCA1*, BRCA2*, BRIP1*, CDC73, CDH1*, CDK4, CDKN1B, CDKN2A, CHEK2*, CTNNA1, DICER1, FH, FLCN, KIF1B, LZTR1, MAX, MEN1, MET, MLH1*, MSH2*, MSH3, MSH6*, MUTYH*, NF1*, NF2, NTHL1, PALB2*, PHOX2B, PMS2*, POT1, PRKAR1A, PTCH1, PTEN*, RAD51C*, RAD51D*, RB1, RET, SDHA, SDHAF2, SDHB, SDHC, SDHD, SMAD4, SMARCA4, SMARCB1, SMARCE1, STK11, SUFU, TMEM127, TP53*, TSC1, TSC2, and VHL (sequencing and deletion/duplication); EGFR, EGLN1, HOXB13, KIT, MITF, PDGFRA, POLD1, and POLE (sequencing only); EPCAM and GREM1 (deletion/duplication only). DNA and RNA analyses performed  for * genes.    05/17/2022 Imaging   CT ABDOMEN PELVIS W WO CONTRAST  Result Date: 05/17/2022 CLINICAL DATA:  History of ovarian cancer status post recent section and completion of chemotherapy 1-1/2 weeks ago. Evaluation of possible ileostomy reversal. EXAM: CT ABDOMEN AND PELVIS WITHOUT AND WITH CONTRAST TECHNIQUE: Multidetector CT imaging of the abdomen and pelvis was performed following the standard protocol before and following the bolus administration of intravenous contrast. RADIATION DOSE REDUCTION: This exam was performed according to the  departmental dose-optimization program which includes automated exposure control, adjustment of the mA and/or kV according to patient size and/or use of iterative reconstruction technique. CONTRAST:  OMNIPAQUE IOHEXOL 300 MG/ML  SOLN COMPARISON:  CT abdomen and pelvis dated 02/09/2022 FINDINGS: Lower chest: No focal consolidation or pulmonary nodule in the lung bases. No pleural effusion or pneumothorax demonstrated. Partially imaged heart size is normal. Hepatobiliary: Postsurgical changes of the right hepatic dome with irregular capsular retraction. Punctate adjacent focus of subdiaphragmatic calcification (11:32). Unchanged subcentimeter hypoattenuating focus along posterior segment 7 (4:18). No intra or extrahepatic biliary ductal dilation. Cholecystectomy. Pancreas: No focal lesions or main ductal dilation. Spleen: Splenectomy. Lobulated, enhancing soft tissue density along the left hemidiaphragm at the splenectomy site measures 1.5 x 1.0 cm (4:11). Adrenals/Urinary Tract: No adrenal nodules. No suspicious renal mass or hydronephrosis. Punctate nonobstructing left lower pole stones. No focal bladder wall thickening. Stomach/Bowel: Normal appearance of the stomach. Right upper quadrant ileostomy. Enteric contrast material is present within the medial limb of the ileostomy. Appendectomy. Vascular/Lymphatic: Aortic atherosclerosis. No enlarged abdominal or pelvic lymph nodes. Reproductive: Hysterectomy and bilateral salpingo-oophorectomy. Ill-defined soft tissue near the left vaginal (4:66). No adnexal masses. Other: Status post omentectomy. Ill-defined lateral peritoneal thickening, for example series 4 image 38 on the right and image 36 on the left associated with scattered foci of mesenteric nodularity, for example 7 x 5 mm (4:34). Musculoskeletal: No acute or abnormal lytic or blastic osseous lesions. Postsurgical changes of the anterior abdominal wall. IMPRESSION: 1. Extensive postsurgical changes of  the abdomen and pelvis. Right upper quadrant ileostomy with enteric contrast material present within the medial limb of the ileostomy. No evidence of obstruction. 2. Multifocal ill-defined soft tissue densities as described, indeterminate, may reflect postsurgical change versus residual tumor. 3. Lobulated, enhancing soft tissue density along the left hemidiaphragm at the splenectomy site may represent residual/recurrent splenic tissue versus residual tumor, although the enhancement pattern does not appear consistent with pre-surgical appearance of the malignancy. 4.  Aortic Atherosclerosis (ICD10-I70.0). Electronically Signed   By: Agustin Cree M.D.   On: 05/17/2022 16:43      06/09/2022 Imaging   CT ABDOMEN PELVIS WO CONTRAST  Result Date: 06/09/2022 CLINICAL DATA:  Ovarian cancer, status post chemotherapy. Rectal contrast administered to evaluate for anastomotic patency prior to reversal. EXAM: CT ABDOMEN AND PELVIS WITHOUT CONTRAST TECHNIQUE: Multidetector CT imaging of the abdomen and pelvis was performed following the standard protocol without IV contrast. RADIATION DOSE REDUCTION: This exam was performed according to the departmental dose-optimization program which includes automated exposure control, adjustment of the mA and/or kV according to patient size and/or use of iterative reconstruction technique. COMPARISON:  05/17/2022 FINDINGS: Lower chest: Lung bases are clear. Hepatobiliary: Unenhanced liver is unremarkable. Status post cholecystectomy. No intrahepatic or extrahepatic ductal dilatation. Pancreas: Within normal limits. 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 Adrenals/Urinary Tract: Adrenal glands are within normal limits. Right kidney is within normal limits. 3 mm nonobstructing left lower pole renal  calculus (series 2/image 22). No hydronephrosis. Bladder is within normal limits. Stomach/Bowel: Stomach is within normal limits. No evidence of bowel obstruction.  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 Status post left hemicolectomy with suture line in the lower pelvis (series 2/image 87). Rectal contrast confirms a patent anastomosis without evidence of leak. Vascular/Lymphatic: No evidence of abdominal aortic aneurysm. Atherosclerotic calcifications of the abdominal aorta and branch vessels. No suspicious abdominopelvic lymphadenopathy. Reproductive: Status post hysterectomy and suspected bilateral salpingo oophorectomy. Other: No abdominopelvic ascites. Mild peritoneal nodularity along the lateral left mid abdomen, with discrete 8 mm nodule (series 2/image 33), more conspicuous than on the prior. Mild nonspecific mesenteric stranding along the jejunal mesentery in central left mid abdomen (series 2/image 30). Mildly prominent soft tissue along the left pelvic cul-de-sac measuring up to 2.0 cm (series 2/image 59), grossly unchanged. Overall appearance suggests very mild peritoneal disease which is overall grossly unchanged. Musculoskeletal: Visualized osseous structures are within normal limits. 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 Rectal contrast confirms a patent anastomosis without evidence of leak. 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 Additional ancillary findings as above. Electronically Signed   By: Charline Bills M.D.   On: 06/09/2022 01:59   CT ABDOMEN PELVIS W WO CONTRAST  Result Date: 05/17/2022 CLINICAL DATA:  History of ovarian cancer status post recent section and completion of chemotherapy 1-1/2 weeks ago. Evaluation of possible ileostomy reversal. EXAM: CT ABDOMEN AND PELVIS WITHOUT AND WITH CONTRAST TECHNIQUE: Multidetector CT imaging of the abdomen and pelvis was performed following the standard protocol before and following the bolus administration of intravenous contrast. RADIATION DOSE REDUCTION: This exam was performed according to the  departmental dose-optimization program which includes automated exposure control, adjustment of the mA and/or kV according to patient size and/or use of iterative reconstruction technique. CONTRAST:  OMNIPAQUE IOHEXOL 300 MG/ML  SOLN COMPARISON:  CT abdomen and pelvis dated 02/09/2022 FINDINGS: Lower chest: No focal consolidation or pulmonary nodule in the lung bases. No pleural effusion or pneumothorax demonstrated. Partially imaged heart size is normal. Hepatobiliary: Postsurgical changes of the right hepatic dome with irregular capsular retraction. Punctate adjacent focus of subdiaphragmatic calcification (11:32). Unchanged subcentimeter hypoattenuating focus along posterior segment 7 (4:18). No intra or extrahepatic biliary ductal dilation. Cholecystectomy. Pancreas: No focal lesions or main ductal dilation. Spleen: Splenectomy. Lobulated, enhancing soft tissue density along the left hemidiaphragm at the splenectomy site measures 1.5 x 1.0 cm (4:11). Adrenals/Urinary Tract: No adrenal nodules. No suspicious renal mass or hydronephrosis. Punctate nonobstructing left lower pole stones. No focal bladder wall thickening. Stomach/Bowel: Normal appearance of the stomach. Right upper quadrant ileostomy. Enteric contrast material is present within the medial limb of the ileostomy. Appendectomy. Vascular/Lymphatic: Aortic atherosclerosis. No enlarged abdominal or pelvic lymph nodes. Reproductive: Hysterectomy and bilateral salpingo-oophorectomy. Ill-defined soft tissue near the left vaginal (4:66). No adnexal masses. Other: Status post omentectomy. Ill-defined lateral peritoneal thickening, for example series 4 image 38 on the right and image 36 on the left associated with scattered foci of mesenteric nodularity, for example 7 x 5 mm (4:34). Musculoskeletal: No acute or abnormal lytic or blastic osseous lesions. Postsurgical changes of the anterior abdominal wall. IMPRESSION: 1. Extensive postsurgical changes of  the abdomen and pelvis. Right upper quadrant ileostomy with enteric contrast material present within the medial limb of the ileostomy. No evidence of obstruction. 2. Multifocal ill-defined soft tissue densities as described, indeterminate, may reflect postsurgical change versus residual tumor. 3. Lobulated, enhancing  soft tissue density along the left hemidiaphragm at the splenectomy site may represent residual/recurrent splenic tissue versus residual tumor, although the enhancement pattern does not appear consistent with pre-surgical appearance of the malignancy. 4.  Aortic Atherosclerosis (ICD10-I70.0). Electronically Signed   By: Agustin Cree M.D.   On: 05/17/2022 16:43       Received C3 of adjuvant etoposide and cisplatin on 04/29/22.  Interval History: Patient reports overall doing well.  Fatigue has improved, not to baseline.  Reports good functioning of her ileostomy.  Denies any bladder symptoms.  Endorses good appetite without nausea or emesis.  Denies abdominal pain.  Since the weekend, has noted some light vaginal discharge as well as spotting, which is new for her.  Past Medical/Surgical History: Past Medical History:  Diagnosis Date   Arthritis    Endometriosis    Family history of breast cancer    Family history of breast cancer in mother    at age 75   GAD (generalized anxiety disorder)    lexapro helped 2017 (Dr. Emmit Alexanders did have extra stress of the Master's degree program on her at that time.  Got counseling.       GERD (gastroesophageal reflux disease)    Gestational diabetes    Kidney stones    Low back pain    Dr. Terrilee Files.   Neck pain    and left shoulder pain: managed by osteopathic manipulation by Dr. Terrilee Files. ?cervical radiculopathy   ovarian ca 12/01/2021   PAC (premature atrial contraction)     Past Surgical History:  Procedure Laterality Date   ABDOMINAL EXPLORATION SURGERY  2009   fulguration of endometriosis - [facility] surgery   IR IMAGING GUIDED PORT  INSERTION  12/08/2021   IR US GUIDE BX ASP/DRAIN  12/08/2021   SALPINGECTOMY Right 2009   TONSILLECTOMY     TUBAL LIGATION Left 2009    Family History  Problem Relation Age of Onset   Rheum arthritis Mother    Stroke Mother    Hypertension Mother    Heart disease Mother    Arthritis Mother    Breast cancer Mother 43   Heart attack Mother 6   Diabetes Father    Heart disease Father    Hyperlipidemia Father    Hypertension Father    Heart attack Father 48   Non-Hodgkin's lymphoma Paternal Uncle 53   Heart disease Maternal Grandmother    Heart attack Maternal Grandmother 6   Arthritis Maternal Grandmother    Early death Maternal Grandfather 30       MVA   Diabetes Paternal Grandmother    Hypertension Paternal Grandmother    Heart disease Paternal Grandmother    Heart attack Paternal Grandmother 41   Heart disease Paternal Grandfather    Heart attack Paternal Grandfather 78   Coronary artery disease Other    Glaucoma Other     Social History   Socioeconomic History   Marital status: Married    Spouse name: Not on file   Number of children: 3   Years of education: Not on file   Highest education level: Not on file  Occupational History   Occupation: Academic librarian: [employer]    Comment: at [employer]   Tobacco Use   Smoking status: Never   Smokeless tobacco: Never  Vaping Use   Vaping Use: Never used  Substance and Sexual Activity   Alcohol use: Yes    Comment: Occasionally   Drug use: No  Sexual activity: Yes    Partners: Male    Birth control/protection: Surgical  Other Topics Concern   Not on file  Social History Narrative   Married, 3 kids.    Lives in [place].   Educ: Masters deg nursing.   Pt works as a Systems developer at [employer] with husband Minerva Areola and three kids (Will, [place], and [place]).   No tob/drugs.   Alc: social.   Social Determinants of Health   Financial Resource Strain: Not on file  Food Insecurity: No Food  Insecurity (01/05/2022)   Hunger Vital Sign    Worried About Running Out of Food in the Last Year: Never true    Ran Out of Food in the Last Year: Never true  Transportation Needs: No Transportation Needs (01/05/2022)   PRAPARE - Administrator, Civil Service (Medical): No    Lack of Transportation (Non-Medical): No  Physical Activity: Not on file  Stress: Not on file  Social Connections: Not on file    Current Medications:  Current Outpatient Medications:    acetaminophen (TYLENOL) 650 MG CR tablet, Take 650 mg by mouth every 8 (eight) hours as needed for pain., Disp: , Rfl:    cholecalciferol (VITAMIN D3) 25 MCG (1000 UNIT) tablet, Take 2,000 Units by mouth daily., Disp: , Rfl:    famotidine (PEPCID) 10 MG tablet, Take 10 mg by mouth daily as needed for heartburn or indigestion., Disp: , Rfl:    LORazepam (ATIVAN) 0.5 MG tablet, Take 1 tablet (0.5 mg total) by mouth every 8 (eight) hours., Disp: 30 tablet, Rfl: 0   pregabalin (LYRICA) 25 MG capsule, Take 1 capsule (25 mg) by mouth at bedtime., Disp: 90 capsule, Rfl: 1   traMADol (ULTRAM) 50 MG tablet, Take 1 tablet (50 mg total) by mouth every 6 (six) hours as needed for severe pain., Disp: 30 tablet, Rfl: 0  Review of Systems: Denies appetite changes, fevers, chills, fatigue, unexplained weight changes. Denies hearing loss, neck lumps or masses, mouth sores, ringing in ears or voice changes. Denies cough or wheezing.  Denies shortness of breath. Denies chest pain or palpitations. Denies leg swelling. Denies abdominal distention, pain, blood in stools, constipation, diarrhea, nausea, vomiting, or early satiety. Denies pain with intercourse, dysuria, frequency, hematuria or incontinence. Denies hot flashes, pelvic pain.   Denies joint pain, back pain or muscle pain/cramps. Denies itching, rash, or wounds. Denies dizziness, headaches, numbness or seizures. Denies swollen lymph nodes or glands, denies easy bruising or  bleeding. Denies anxiety, depression, confusion, or decreased concentration.  Physical Exam: BP (!) 142/90 (BP Location: Right Arm, Patient Position: Sitting)   Pulse (!) 141 Comment: patient was upset from an incident from this morning  Temp 98.5 F (36.9 C) (Oral)   Wt 107 lb (48.5 kg)   SpO2 100%   BMI 21.61 kg/m  Repeat pulse at end of visit: 107. BP was 122/65. General: Alert, oriented, no acute distress. HEENT: Normocephalic, atraumatic, sclera anicteric. Chest: Clear to auscultation bilaterally.  No wheezes or rhonchi. Cardiovascular: Tachycardic with heart rate in the 110s, regular rhythm, no murmurs. Abdomen: soft, nontender.  Normoactive bowel sounds.  No masses or hepatosplenomegaly appreciated.  Well-healed incision. Extremities: Grossly normal range of motion.  Warm, well perfused.  No edema bilaterally. Skin: No rashes or lesions noted. Lymphatics: No cervical, supraclavicular, or inguinal adenopathy. GU: Normal appearing external genitalia without erythema, excoriation, or lesions.  Speculum exam reveals mildly atrophic vaginal mucosa.  Cuff  is intact.  There is a 3-4 mm polypoid lesion along the midportion of the cuff, mildly friable.  Otherwise, no lesions visible.  Bimanual exam reveals smooth cuff (performed after biopsy).  Rectovaginal exam deferred..  Vaginal cuff biopsy procedure Preoperative diagnosis: vaginal cuff lesion Postoperative diagnosis: Same as above Physician: Pricilla Holm MD Estimated blood loss: Minimal Specimens: Vaginal cuff biopsy Procedure:  She was then placed in dorsolithotomy position and a speculum was placed in the vagina.  Once the vaginal cuff had been inspected, recommendation was made for vaginal cuff biopsy.  This was discussed with the patient who gave verbal consent.  The vaginal cuff was cleansed with with Betadine x3.  A Tischler forcep was used to biopsy the lesion along the mid-portion of the cuff.  This was placed in formalin.  Overall  the patient tolerated the procedure well.  All instruments were removed from the vagina.  Laboratory & Radiologic Studies: CT Pelvis 06/07/22: Status post left hemicolectomy with diverting ileostomy in the right mid abdomen. Rectal contrast confirms a patent anastomosis without evidence of leak. 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 Additional ancillary findings as above.  CT A/P on 05/17/22: 1. Extensive postsurgical changes of the abdomen and pelvis. Right upper quadrant ileostomy with enteric contrast material present within the medial limb of the ileostomy. No evidence of obstruction. 2. Multifocal ill-defined soft tissue densities as described, indeterminate, may reflect postsurgical change versus residual tumor. 3. Lobulated, enhancing soft tissue density along the left hemidiaphragm at the splenectomy site may represent residual/recurrent splenic tissue versus residual tumor, although the enhancement pattern does not appear consistent with pre-surgical appearance of the malignancy. 4.  Aortic Atherosclerosis (ICD10-I70.0).  AFP  4/23: 3.2 2/5: 7.9 12/28: 173 01/09/22: 8,546 At diagnosis: 42,699  Assessment & Plan: [name] is a 52 y.o. 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  Now has completed 3 cycles of adjuvant chemotherapy with etoposide and cisplatin.  Patient is overall doing well.  Vaginal spotting appears to be in the setting of a vaginal cuff lesion, which may be granulation tissue.  Biopsy performed today to rule out malignancy.  Recent CT scan shows rectosigmoid anastomosis has healed well.  Discussed plan for reversal next month.  Also reviewed CT scan findings which are hard to interpret in the setting of large surgery and HIPEC.  It may be that these are postoperative changes.  We will get close  follow-up imaging in 3 months to assess for any change.  AFP from this week is normal for the first time.  CA125 is pending.  We reviewed the plan for a Ileostomy reversal, possible laparotomy. The risks of surgery were discussed in detail and she understands these to include infection; wound separation; hernia; injury to adjacent organs; bleeding which may require blood transfusion; anesthesia risk; thromboembolic events; possible death; unforeseen complications; possible need for re-exploration; medical complications such as heart attack, stroke, pleural effusion and pneumonia. The patient will receive DVT and antibiotic prophylaxis as indicated. She voiced a clear understanding. She had the opportunity to ask questions. Perioperative instructions were reviewed with her. Prescriptions for post-op medications were sent to her pharmacy of choice.  28 minutes of total time was spent for this patient encounter, including preparation, face-to-face counseling with the patient and coordination of care, and documentation of the encounter.  Eugene Garnet, MD  Division of Gynecologic Oncology  Department of Obstetrics and Gynecology  [facility]   

## 2022-06-09 ENCOUNTER — Other Ambulatory Visit: Payer: Self-pay

## 2022-06-09 ENCOUNTER — Inpatient Hospital Stay (HOSPITAL_BASED_OUTPATIENT_CLINIC_OR_DEPARTMENT_OTHER): Payer: 59 | Admitting: Gynecologic Oncology

## 2022-06-09 ENCOUNTER — Inpatient Hospital Stay: Payer: 59

## 2022-06-09 ENCOUNTER — Encounter: Payer: Self-pay | Admitting: Gynecologic Oncology

## 2022-06-09 ENCOUNTER — Other Ambulatory Visit: Payer: Self-pay | Admitting: Gynecologic Oncology

## 2022-06-09 ENCOUNTER — Other Ambulatory Visit: Payer: Self-pay | Admitting: Hematology and Oncology

## 2022-06-09 VITALS — BP 122/65 | HR 107

## 2022-06-09 VITALS — BP 142/90 | HR 141 | Temp 98.5°F | Wt 107.0 lb

## 2022-06-09 DIAGNOSIS — Z7189 Other specified counseling: Secondary | ICD-10-CM | POA: Diagnosis not present

## 2022-06-09 DIAGNOSIS — Z932 Ileostomy status: Secondary | ICD-10-CM

## 2022-06-09 DIAGNOSIS — N9489 Other specified conditions associated with female genital organs and menstrual cycle: Secondary | ICD-10-CM

## 2022-06-09 DIAGNOSIS — C569 Malignant neoplasm of unspecified ovary: Secondary | ICD-10-CM

## 2022-06-09 DIAGNOSIS — N898 Other specified noninflammatory disorders of vagina: Secondary | ICD-10-CM

## 2022-06-09 DIAGNOSIS — N939 Abnormal uterine and vaginal bleeding, unspecified: Secondary | ICD-10-CM | POA: Diagnosis not present

## 2022-06-09 LAB — AFP TUMOR MARKER: AFP, Serum, Tumor Marker: 3.2 ng/mL (ref 0.0–6.4)

## 2022-06-09 MED ORDER — MENINGOCOCCAL A C Y&W-135 OLIG IM SOLR
0.5000 mL | Freq: Once | INTRAMUSCULAR | Status: AC
  Administered 2022-06-09: 0.5 mL via INTRAMUSCULAR
  Filled 2022-06-09: qty 0.5

## 2022-06-09 MED ORDER — MENINGOCOCCAL VAC B (OMV) IM SUSY
0.5000 mL | PREFILLED_SYRINGE | Freq: Once | INTRAMUSCULAR | Status: AC
  Administered 2022-06-09: 0.5 mL via INTRAMUSCULAR
  Filled 2022-06-09: qty 0.5

## 2022-06-09 NOTE — Patient Instructions (Addendum)
Preparing for your Surgery  Plan for surgery on Jun 22, 2022 with Dr. Eugene Garnet at [facility]. You will be scheduled for ostomy reversal.   Plan for stay in the hospital for 1-4 days, based on bowel return.  Pre-operative Testing -You will receive a phone call from presurgical testing at [facility] to arrange for a pre-operative appointment and lab work.  -Bring your insurance card, copy of an advanced directive if applicable, medication list  -At that visit, you will be asked to sign a consent for a possible blood transfusion in case a transfusion becomes necessary during surgery.  The need for a blood transfusion is rare but having consent is a necessary part of your care.     -You should not be taking blood thinners or aspirin at least ten days prior to surgery unless instructed by your surgeon.  -Do not take supplements such as fish oil (omega 3), red yeast rice, turmeric before your surgery. You want to avoid medications with aspirin in them including headache powders such as BC or Goody's), Excedrin migraine.  Day Before Surgery at Home -You will be asked to take in a light diet the day before surgery. You will be advised you can have clear liquids up until 3 hours before your surgery.    Eat a light diet the day before surgery.  Examples including soups, broths, toast, yogurt, mashed potatoes.  AVOID GAS PRODUCING FOODS AND BEVERAGES. Things to avoid include carbonated beverages (fizzy beverages, sodas), raw fruits and raw vegetables (uncooked), or beans.   If your bowels are filled with gas, your surgeon will have difficulty visualizing your pelvic organs which increases your surgical risks.  Your role in recovery Your role is to become active as soon as directed by your doctor, while still giving yourself time to heal.  Rest when you feel tired. You will be asked to do the following in order to speed your recovery:  - Cough and breathe deeply. This helps  to clear and expand your lungs and can prevent pneumonia after surgery.  - STAY ACTIVE WHEN YOU GET HOME. Do mild physical activity. Walking or moving your legs help your circulation and body functions return to normal. Do not try to get up or walk alone the first time after surgery.   -If you develop swelling on one leg or the other, pain in the back of your leg, redness/warmth in one of your legs, please call the office or go to the Emergency Room to have a doppler to rule out a blood clot. For shortness of breath, chest pain-seek care in the Emergency Room as soon as possible. - Actively manage your pain. Managing your pain lets you move in comfort. We will ask you to rate your pain on a scale of zero to 10. It is your responsibility to tell your doctor or nurse where and how much you hurt so your pain can be treated.  Special Considerations -If you are diabetic, you may be placed on insulin after surgery to have closer control over your blood sugars to promote healing and recovery.  This does not mean that you will be discharged on insulin.  If applicable, your oral antidiabetics will be resumed when you are tolerating a solid diet.  -Your final pathology results from surgery should be available around one week after surgery and the results will be relayed to you when available.  -Dr. Antionette Char is the surgeon that assists your GYN Oncologist with  surgery.  If you end up staying the night, the next day after your surgery you will either see Dr. Pricilla Holm, Dr. Alvester Morin, or Dr. Antionette Char.  -FMLA forms can be faxed to [phone] and please allow 5-7 business days for completion.  Pain Management After Surgery -Make sure that you have Tylenol and Ibuprofen IF YOU ARE ABLE TO TAKE THESE MEDICATIONS at home to use on a regular basis after surgery for pain control. We recommend alternating the medications every hour to six hours since they work differently and are processed in the body  differently for pain relief.  -Review the attached handout on narcotic use and their risks and side effects.   Bowel Regimen -It is important to prevent constipation and drink adequate amounts of liquids.   Risks of Surgery Risks of surgery are low but include bleeding, infection, damage to surrounding structures, re-operation, blood clots, and very rarely death.   Blood Transfusion Information (For the consent to be signed before surgery)  We will be checking your blood type before surgery so in case of emergencies, we will know what type of blood you would need.                                            WHAT IS A BLOOD TRANSFUSION?  A transfusion is the replacement of blood or some of its parts. Blood is made up of multiple cells which provide different functions. Red blood cells carry oxygen and are used for blood loss replacement. White blood cells fight against infection. Platelets control bleeding. Plasma helps clot blood. Other blood products are available for specialized needs, such as hemophilia or other clotting disorders. BEFORE THE TRANSFUSION  Who gives blood for transfusions?  You may be able to donate blood to be used at a later date on yourself (autologous donation). Relatives can be asked to donate blood. This is generally not any safer than if you have received blood from a stranger. The same precautions are taken to ensure safety when a relative's blood is donated. Healthy volunteers who are fully evaluated to make sure their blood is safe. This is blood bank blood. Transfusion therapy is the safest it has ever been in the practice of medicine. Before blood is taken from a donor, a complete history is taken to make sure that person has no history of diseases nor engages in risky social behavior (examples are intravenous drug use or sexual activity with multiple partners). The donor's travel history is screened to minimize risk of transmitting infections, such as  malaria. The donated blood is tested for signs of infectious diseases, such as HIV and hepatitis. The blood is then tested to be sure it is compatible with you in order to minimize the chance of a transfusion reaction. If you or a relative donates blood, this is often done in anticipation of surgery and is not appropriate for emergency situations. It takes many days to process the donated blood. RISKS AND COMPLICATIONS Although transfusion therapy is very safe and saves many lives, the main dangers of transfusion include:  Getting an infectious disease. Developing a transfusion reaction. This is an allergic reaction to something in the blood you were given. Every precaution is taken to prevent this. The decision to have a blood transfusion has been considered carefully by your caregiver before blood is given. Blood is not given unless the benefits  outweigh the risks.  AFTER SURGERY INSTRUCTIONS  Return to work: 4-6 weeks if applicable  Activity: 1. Be up and out of the bed during the day.  Take a nap if needed.  You may walk up steps but be careful and use the hand rail.  Stair climbing will tire you more than you think, you may need to stop part way and rest.   2. No lifting or straining for 6 weeks over 10 pounds. No pushing, pulling, straining for 6 weeks.  3. No driving for around 1 week(s).  Do not drive if you are taking narcotic pain medicine and make sure that your reaction time has returned.   4. You can shower as soon as the next day after surgery. Shower daily.  Use your regular soap and water (not directly on the incision) and pat your incision(s) dry afterwards; don't rub.  No tub baths or submerging your body in water until cleared by your surgeon. If you have the soap that was given to you by pre-surgical testing that was used before surgery, you do not need to use it afterwards because this can irritate your incisions.   5. You may experience a small amount of clear drainage from  your incision, which is normal.  If the drainage persists, increases, or changes color please call the office.  6. Do not use creams, lotions, or ointments such as neosporin on your incision after surgery until advised by your surgeon.  7. Take Tylenol or ibuprofen first for pain if you are able to take these medications and only use narcotic pain medication for severe pain not relieved by the Tylenol or Ibuprofen.  Monitor your Tylenol intake to a max of 4,000 mg in a 24 hour period. You can alternate these medications after surgery.  Diet: 1. Low sodium Heart Healthy Diet is recommended but you are cleared to resume your normal (before surgery) diet after your procedure.  Wound Care: 1. Keep clean and dry.  Shower daily.  Reasons to call the Doctor: Fever - Oral temperature greater than 100.4 degrees Fahrenheit Foul-smelling vaginal discharge Difficulty urinating Nausea and vomiting Increased pain at the site of the incision that is unrelieved with pain medicine. Difficulty breathing with or without chest pain New calf pain especially if only on one side Sudden, continuing increased vaginal bleeding with or without clots.   Contacts: For questions or concerns you should contact:  Dr. Eugene Garnet at [phone]  [name], NP at [phone]  After Hours: call [phone] and have the GYN Oncologist paged/contacted (after 5 pm or on the weekends). You will speak with an after hours RN and let he or she know you have had surgery.  Messages sent via mychart are for non-urgent matters and are not responded to after hours so for urgent needs, please call the after hours number.

## 2022-06-09 NOTE — Patient Instructions (Signed)
Meningococcal ACWY Vaccine Injection What is this medication? MENINGOCOCCAL ACWY VACCINE (muh nin jeh KOK kul ACWY vak SEEN), or MENINGOCOCCAL CONJUGATE VACCINE (muh nin jeh KOK kul KON juh geyt vak SEEN), reduces the risk of meningitis. It does not treat meningitis. It is still possible to get meningitis after receiving this vaccine, but the symptoms may be less severe or not last as long. It works by helping your immune system learn how to fight off a future infection. This medicine may be used for other purposes; ask your health care provider or pharmacist if you have questions. COMMON BRAND NAME(S): Menactra, MenQuadfi, Menveo What should I tell my care team before I take this medication? They need to know if you have any of these conditions: Bleeding disorder Fever or infection History of Guillain-Barre syndrome Immune system problems An unusual or allergic reaction to diphtheria toxoid, meningococcal vaccine, latex, other vaccines, other medications, foods, dyes, or preservatives Pregnant or trying to get pregnant Breastfeeding How should I use this medication? This medication is injected into a muscle. It is given by your care team. A copy of Vaccine Information Statements will be given before each vaccination. Be sure to read this information carefully each time. This sheet may change often. Talk to your care team about the use of this medication in children. While it may be prescribed for children as young as 9 months for selected conditions, precautions do apply. Overdosage: If you think you have taken too much of this medicine contact a poison control center or emergency room at once. NOTE: This medicine is only for you. Do not share this medicine with others. What if I miss a dose? This does not apply. What may interact with this medication? Adalimumab Anakinra Certain medications for arthritis Infliximab Medications for organ transplant Medications to treat  cancer Medications used during some procedures to diagnose a medical condition Other vaccines Steroid medications, such as prednisone or cortisone This list may not describe all possible interactions. Give your health care provider a list of all the medicines, herbs, non-prescription drugs, or dietary supplements you use. Also tell them if you smoke, drink alcohol, or use illegal drugs. Some items may interact with your medicine. What should I watch for while using this medication? Report any side effects to your care team right away. Call your care team if you have any unusual symptoms within 6 weeks of getting this vaccine. This vaccine may not protect from all meningitis infections. Talk to your care team if you may be pregnant. What side effects may I notice from receiving this medication? Side effects that you should report to your care team as soon as possible: Allergic reactions--skin rash, itching, hives, swelling of the face, lips, tongue, or throat Feeling faint or lightheaded Side effects that usually do not require medical attention (report these to your care team if they continue or are bothersome): Diarrhea General discomfort and fatigue Headache Irritability Muscle pain Pain, redness, or irritation at injection site This list may not describe all possible side effects. Call your doctor for medical advice about side effects. You may report side effects to FDA at 1-800-FDA-1088. Where should I keep my medication? This vaccine is only given by your care team. It will not be stored at home. NOTE: This sheet is a summary. It may not cover all possible information. If you have questions about this medicine, talk to your doctor, pharmacist, or health care provider.  2023 Elsevier/Gold Standard (2021-07-13 00:00:00)

## 2022-06-10 ENCOUNTER — Encounter: Payer: Self-pay | Admitting: Hematology and Oncology

## 2022-06-10 LAB — CA 125: Cancer Antigen (CA) 125: 17.5 U/mL (ref 0.0–38.1)

## 2022-06-10 NOTE — Patient Instructions (Signed)
Preparing for your Surgery  Plan for surgery on Jun 22, 2022 with Dr. Katherine Tucker at [facility]. You will be scheduled for ostomy reversal.   Plan for stay in the hospital for 1-4 days, based on bowel return.  Pre-operative Testing -You will receive a phone call from presurgical testing at  Hospital to arrange for a pre-operative appointment and lab work.  -Bring your insurance card, copy of an advanced directive if applicable, medication list  -At that visit, you will be asked to sign a consent for a possible blood transfusion in case a transfusion becomes necessary during surgery.  The need for a blood transfusion is rare but having consent is a necessary part of your care.     -You should not be taking blood thinners or aspirin at least ten days prior to surgery unless instructed by your surgeon.  -Do not take supplements such as fish oil (omega 3), red yeast rice, turmeric before your surgery. You want to avoid medications with aspirin in them including headache powders such as BC or Goody's), Excedrin migraine.  Day Before Surgery at Home -You will be asked to take in a light diet the day before surgery. You will be advised you can have clear liquids up until 3 hours before your surgery.    Eat a light diet the day before surgery.  Examples including soups, broths, toast, yogurt, mashed potatoes.  AVOID GAS PRODUCING FOODS AND BEVERAGES. Things to avoid include carbonated beverages (fizzy beverages, sodas), raw fruits and raw vegetables (uncooked), or beans.   If your bowels are filled with gas, your surgeon will have difficulty visualizing your pelvic organs which increases your surgical risks.  Your role in recovery Your role is to become active as soon as directed by your doctor, while still giving yourself time to heal.  Rest when you feel tired. You will be asked to do the following in order to speed your recovery:  - Cough and breathe deeply. This helps  to clear and expand your lungs and can prevent pneumonia after surgery.  - STAY ACTIVE WHEN YOU GET HOME. Do mild physical activity. Walking or moving your legs help your circulation and body functions return to normal. Do not try to get up or walk alone the first time after surgery.   -If you develop swelling on one leg or the other, pain in the back of your leg, redness/warmth in one of your legs, please call the office or go to the Emergency Room to have a doppler to rule out a blood clot. For shortness of breath, chest pain-seek care in the Emergency Room as soon as possible. - Actively manage your pain. Managing your pain lets you move in comfort. We will ask you to rate your pain on a scale of zero to 10. It is your responsibility to tell your doctor or nurse where and how much you hurt so your pain can be treated.  Special Considerations -If you are diabetic, you may be placed on insulin after surgery to have closer control over your blood sugars to promote healing and recovery.  This does not mean that you will be discharged on insulin.  If applicable, your oral antidiabetics will be resumed when you are tolerating a solid diet.  -Your final pathology results from surgery should be available around one week after surgery and the results will be relayed to you when available.  -Dr. Lisa Jackson-Moore is the surgeon that assists your GYN Oncologist with   surgery.  If you end up staying the night, the next day after your surgery you will either see Dr. Tucker, Dr. Newton, or Dr. Lisa Jackson-Moore.  -FMLA forms can be faxed to [phone] and please allow 5-7 business days for completion.  Pain Management After Surgery -Make sure that you have Tylenol and Ibuprofen IF YOU ARE ABLE TO TAKE THESE MEDICATIONS at home to use on a regular basis after surgery for pain control. We recommend alternating the medications every hour to six hours since they work differently and are processed in the body  differently for pain relief.  -Review the attached handout on narcotic use and their risks and side effects.   Bowel Regimen -It is important to prevent constipation and drink adequate amounts of liquids.   Risks of Surgery Risks of surgery are low but include bleeding, infection, damage to surrounding structures, re-operation, blood clots, and very rarely death.   Blood Transfusion Information (For the consent to be signed before surgery)  We will be checking your blood type before surgery so in case of emergencies, we will know what type of blood you would need.                                            WHAT IS A BLOOD TRANSFUSION?  A transfusion is the replacement of blood or some of its parts. Blood is made up of multiple cells which provide different functions. Red blood cells carry oxygen and are used for blood loss replacement. White blood cells fight against infection. Platelets control bleeding. Plasma helps clot blood. Other blood products are available for specialized needs, such as hemophilia or other clotting disorders. BEFORE THE TRANSFUSION  Who gives blood for transfusions?  You may be able to donate blood to be used at a later date on yourself (autologous donation). Relatives can be asked to donate blood. This is generally not any safer than if you have received blood from a stranger. The same precautions are taken to ensure safety when a relative's blood is donated. Healthy volunteers who are fully evaluated to make sure their blood is safe. This is blood bank blood. Transfusion therapy is the safest it has ever been in the practice of medicine. Before blood is taken from a donor, a complete history is taken to make sure that person has no history of diseases nor engages in risky social behavior (examples are intravenous drug use or sexual activity with multiple partners). The donor's travel history is screened to minimize risk of transmitting infections, such as  malaria. The donated blood is tested for signs of infectious diseases, such as HIV and hepatitis. The blood is then tested to be sure it is compatible with you in order to minimize the chance of a transfusion reaction. If you or a relative donates blood, this is often done in anticipation of surgery and is not appropriate for emergency situations. It takes many days to process the donated blood. RISKS AND COMPLICATIONS Although transfusion therapy is very safe and saves many lives, the main dangers of transfusion include:  Getting an infectious disease. Developing a transfusion reaction. This is an allergic reaction to something in the blood you were given. Every precaution is taken to prevent this. The decision to have a blood transfusion has been considered carefully by your caregiver before blood is given. Blood is not given unless the benefits   outweigh the risks.  AFTER SURGERY INSTRUCTIONS  Return to work: 4-6 weeks if applicable  Activity: 1. Be up and out of the bed during the day.  Take a nap if needed.  You may walk up steps but be careful and use the hand rail.  Stair climbing will tire you more than you think, you may need to stop part way and rest.   2. No lifting or straining for 6 weeks over 10 pounds. No pushing, pulling, straining for 6 weeks.  3. No driving for around 1 week(s).  Do not drive if you are taking narcotic pain medicine and make sure that your reaction time has returned.   4. You can shower as soon as the next day after surgery. Shower daily.  Use your regular soap and water (not directly on the incision) and pat your incision(s) dry afterwards; don't rub.  No tub baths or submerging your body in water until cleared by your surgeon. If you have the soap that was given to you by pre-surgical testing that was used before surgery, you do not need to use it afterwards because this can irritate your incisions.   5. You may experience a small amount of clear drainage from  your incision, which is normal.  If the drainage persists, increases, or changes color please call the office.  6. Do not use creams, lotions, or ointments such as neosporin on your incision after surgery until advised by your surgeon.  7. Take Tylenol or ibuprofen first for pain if you are able to take these medications and only use narcotic pain medication for severe pain not relieved by the Tylenol or Ibuprofen.  Monitor your Tylenol intake to a max of 4,000 mg in a 24 hour period. You can alternate these medications after surgery.  Diet: 1. Low sodium Heart Healthy Diet is recommended but you are cleared to resume your normal (before surgery) diet after your procedure.  Wound Care: 1. Keep clean and dry.  Shower daily.  Reasons to call the Doctor: Fever - Oral temperature greater than 100.4 degrees Fahrenheit Foul-smelling vaginal discharge Difficulty urinating Nausea and vomiting Increased pain at the site of the incision that is unrelieved with pain medicine. Difficulty breathing with or without chest pain New calf pain especially if only on one side Sudden, continuing increased vaginal bleeding with or without clots.   Contacts: For questions or concerns you should contact:  Dr. Katherine Tucker at [phone]  Ivis Nicolson, NP at [phone]  After Hours: call [phone] and have the GYN Oncologist paged/contacted (after 5 pm or on the weekends). You will speak with an after hours RN and let he or she know you have had surgery.  Messages sent via mychart are for non-urgent matters and are not responded to after hours so for urgent needs, please call the after hours number.      

## 2022-06-10 NOTE — Progress Notes (Signed)
Patient here for follow up with Dr. Eugene Garnet and for a pre-operative appointment prior to her scheduled surgery on Jun 22, 2022. She is scheduled for ostomy reversal. The surgery was discussed in detail.  See after visit summary for additional details.    Discussed post-op pain management in detail including the aspects of the enhanced recovery pathway.  Advised her that a new prescription would be sent in for after surgery. We discussed the use of tylenol post-op and to monitor for a maximum of 4,000 mg in a 24 hour period.       Discussed the use of SCDs and measures to take at home to prevent DVT including frequent mobility.  Reportable signs and symptoms of DVT discussed. Post-operative instructions discussed and expectations for after surgery. Incisional care discussed as well including reportable signs and symptoms including erythema, drainage, wound separation.     5 minutes spent with the patient and preparing information.  Verbalizing understanding of material discussed. No needs or concerns voiced at the end of the visit.  Advised patient to call for any needs.    This appointment is included in the global surgical bundle as pre-operative teaching and has no charge.

## 2022-06-13 ENCOUNTER — Ambulatory Visit (HOSPITAL_COMMUNITY): Payer: 59

## 2022-06-13 LAB — SURGICAL PATHOLOGY

## 2022-06-14 ENCOUNTER — Telehealth: Payer: Self-pay

## 2022-06-14 NOTE — Patient Instructions (Signed)
SURGICAL WAITING ROOM VISITATION  Patients having surgery or a procedure may have no more than 2 support people in the waiting area - these visitors may rotate.    Children under the age of 50 must have an adult with them who is not the patient.  Due to an increase in RSV and influenza rates and associated hospitalizations, children ages 77 and under may not visit patients in [facility] hospitals.  If the patient needs to stay at the hospital during part of their recovery, the visitor guidelines for inpatient rooms apply. Pre-op nurse will coordinate an appropriate time for 1 support person to accompany patient in pre-op.  This support person may not rotate.    Please refer to the [facility] website for the visitor guidelines for Inpatients (after your surgery is over and you are in a regular room).       Your procedure is scheduled on: 06-22-22   Report to [facility] Main Entrance    Report to admitting at      0915  AM   Call this number if you have problems the morning of surgery [phone]  Eat a light diet the day before surgery.  Examples including soups, broths, toast, yogurt, mashed potatoes.  Things to avoid include carbonated beverages (fizzy beverages), raw fruits and raw vegetables, or beans.   If your bowels are filled with gas, your surgeon will have difficulty visualizing your pelvic organs which increases your surgical risks.   Do not eat food :After Midnight.   After Midnight you may have the following liquids until _0830_____ AM/ PM DAY OF SURGERY  then nothing by mouth  Water Non-Citrus Juices (without pulp, NO RED-Apple, White grape, White cranberry) Black Coffee (NO MILK/CREAM OR CREAMERS, sugar ok)  Clear Tea (NO MILK/CREAM OR CREAMERS, sugar ok) regular and decaf                             Plain Jell-O (NO RED)                                           Fruit ices (not with fruit pulp, NO RED)                                     Popsicles (NO  RED)                                                               Sports drinks like Gatorade (NO RED)                     If you have questions, please contact your surgeon's office.   FOLLOW BOWEL PREP AND ANY ADDITIONAL PRE OP INSTRUCTIONS YOU RECEIVED FROM YOUR SURGEON'S OFFICE!!!     Oral Hygiene is also important to reduce your risk of infection.  Remember - BRUSH YOUR TEETH THE MORNING OF SURGERY WITH YOUR REGULAR TOOTHPASTE  DENTURES WILL BE REMOVED PRIOR TO SURGERY PLEASE DO NOT APPLY "Poly grip" OR ADHESIVES!!!   Do NOT smoke after Midnight   Take these medicines the morning of surgery with A SIP OF WATER: pepcid if needed, Ativan if needed, tramadol ot tylenol if needed    Bring CPAP mask and tubing day of surgery.                              You may not have any metal on your body including hair pins, jewelry, and body piercing             Do not wear make-up, lotions, powders, perfumes/cologne, or deodorant  Do not wear nail polish including gel and S&S, artificial/acrylic nails, or any other type of covering on natural nails including finger and toenails. If you have artificial nails, gel coating, etc. that needs to be removed by a nail salon please have this removed prior to surgery or surgery may need to be canceled/ delayed if the surgeon/ anesthesia feels like they are unable to be safely monitored.   Do not shave  48 hours prior to surgery.               Men may shave face and neck. Do not bring valuables to the hospital. [facility] IS NOT             RESPONSIBLE   FOR VALUABLES.   Contacts, glasses, dentures or bridgework may not be worn into surgery.   Bring small overnight bag day of surgery.   DO NOT BRING YOUR HOME MEDICATIONS TO THE HOSPITAL. PHARMACY WILL DISPENSE MEDICATIONS LISTED ON YOUR MEDICATION LIST TO YOU DURING YOUR ADMISSION IN THE HOSPITAL!    Patients discharged on the day of surgery will not be allowed  to drive home.  Someone NEEDS to stay with you for the first 24 hours after anesthesia.               Please read over the following fact sheets you were given: IF YOU HAVE QUESTIONS ABOUT YOUR PRE-OP INSTRUCTIONS PLEASE CALL [phone]    If you test positive for Covid or have been in contact with anyone that has tested positive in the last 10 days please notify you surgeon.    [employer] - Preparing for Surgery Before surgery, you can play an important role.  Because skin is not sterile, your skin needs to be as free of germs as possible.  You can reduce the number of germs on your skin by washing with CHG (chlorahexidine gluconate) soap before surgery.  CHG is an antiseptic cleaner which kills germs and bonds with the skin to continue killing germs even after washing. Please DO NOT use if you have an allergy to CHG or antibacterial soaps.  If your skin becomes reddened/irritated stop using the CHG and inform your nurse when you arrive at Short Stay. Do not shave (including legs and underarms) for at least 48 hours prior to the first CHG shower.  You may shave your face/neck. Please follow these instructions carefully:  1.  Shower with CHG Soap the night before surgery and the  morning of Surgery.  2.  If you choose to wash your hair, wash your hair first as usual with your  normal  shampoo.  3.  After you shampoo, rinse your hair and  body thoroughly to remove the  shampoo.                           4.  Use CHG as you would any other liquid soap.  You can apply chg directly  to the skin and wash                       Gently with a scrungie or clean washcloth.  5.  Apply the CHG Soap to your body ONLY FROM THE NECK DOWN.   Do not use on face/ open                           Wound or open sores. Avoid contact with eyes, ears mouth and genitals (private parts).                       Wash face,  Genitals (private parts) with your normal soap.             6.  Wash thoroughly, paying special  attention to the area where your surgery  will be performed.  7.  Thoroughly rinse your body with warm water from the neck down.  8.  DO NOT shower/wash with your normal soap after using and rinsing off  the CHG Soap.                9.  Pat yourself dry with a clean towel.            10.  Wear clean pajamas.            11.  Place clean sheets on your bed the night of your first shower and do not  sleep with pets. Day of Surgery : Do not apply any lotions/deodorants the morning of surgery.  Please wear clean clothes to the hospital/surgery center.  FAILURE TO FOLLOW THESE INSTRUCTIONS MAY RESULT IN THE CANCELLATION OF YOUR SURGERY PATIENT SIGNATURE_________________________________  NURSE SIGNATURE__________________________________  ________________________________________________________________________ WHAT IS A BLOOD TRANSFUSION? Blood Transfusion Information  A transfusion is the replacement of blood or some of its parts. Blood is made up of multiple cells which provide different functions. Red blood cells carry oxygen and are used for blood loss replacement. White blood cells fight against infection. Platelets control bleeding. Plasma helps clot blood. Other blood products are available for specialized needs, such as hemophilia or other clotting disorders. BEFORE THE TRANSFUSION  Who gives blood for transfusions?  Healthy volunteers who are fully evaluated to make sure their blood is safe. This is blood bank blood. Transfusion therapy is the safest it has ever been in the practice of medicine. Before blood is taken from a donor, a complete history is taken to make sure that person has no history of diseases nor engages in risky social behavior (examples are intravenous drug use or sexual activity with multiple partners). The donor's travel history is screened to minimize risk of transmitting infections, such as malaria. The donated blood is tested for signs of infectious diseases, such  as HIV and hepatitis. The blood is then tested to be sure it is compatible with you in order to minimize the chance of a transfusion reaction. If you or a relative donates blood, this is often done in anticipation of surgery and is not appropriate for emergency situations. It takes many days to process the donated blood. RISKS AND COMPLICATIONS Although  transfusion therapy is very safe and saves many lives, the main dangers of transfusion include:  Getting an infectious disease. Developing a transfusion reaction. This is an allergic reaction to something in the blood you were given. Every precaution is taken to prevent this. The decision to have a blood transfusion has been considered carefully by your caregiver before blood is given. Blood is not given unless the benefits outweigh the risks. AFTER THE TRANSFUSION Right after receiving a blood transfusion, you will usually feel much better and more energetic. This is especially true if your red blood cells have gotten low (anemic). The transfusion raises the level of the red blood cells which carry oxygen, and this usually causes an energy increase. The nurse administering the transfusion will monitor you carefully for complications. HOME CARE INSTRUCTIONS  No special instructions are needed after a transfusion. You may find your energy is better. Speak with your caregiver about any limitations on activity for underlying diseases you may have. SEEK MEDICAL CARE IF:  Your condition is not improving after your transfusion. You develop redness or irritation at the intravenous (IV) site. SEEK IMMEDIATE MEDICAL CARE IF:  Any of the following symptoms occur over the next 12 hours: Shaking chills. You have a temperature by mouth above 102 F (38.9 C), not controlled by medicine. Chest, back, or muscle pain. People around you feel you are not acting correctly or are confused. Shortness of breath or difficulty breathing. Dizziness and fainting. You get  a rash or develop hives. You have a decrease in urine output. Your urine turns a dark color or changes to pink, red, or brown. Any of the following symptoms occur over the next 10 days: You have a temperature by mouth above 102 F (38.9 C), not controlled by medicine. Shortness of breath. Weakness after normal activity. The white part of the eye turns yellow (jaundice). You have a decrease in the amount of urine or are urinating less often. Your urine turns a dark color or changes to pink, red, or brown. Document Released: 01/29/2000 Document Revised: 04/25/2011 Document Reviewed: 09/17/2007 [facility] Patient Information 2014 [place], [place].  _______________________________________________________________________

## 2022-06-14 NOTE — Progress Notes (Unsigned)
Symptom Management Consult Note [facility]    Patient Care Team: [name], MD as PCP - General (Family Medicine) Orbie Pyo, MD as PCP - Cardiology (Cardiology) Olivia Mackie, MD as Consulting Physician (Obstetrics and Gynecology) Judi Saa, DO as Consulting Physician (Family Medicine)    Name / MRN / DOB: [name]  [id number]  04-27-1970   Date of visit: 06/15/2022   Chief Complaint/Reason for visit: arm pain    ASSESSMENT & PLAN: Patient is a 52 y.o. 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 Bertis Ruddy.  I have viewed most recent oncology note and lab work.    #Malignant germ cell tumor of ovary  - Ileostomy take down scheduled for next week, has pre op appointment today - Finished 3 rounds of chemotherapy last month   #Arm pain - Had meningococcal booster on 06/09/22 and developed redness and swelling. - On exam patient is non toxic appearing. She has erythema overlying right deltoid with hard mass, no gross abscess, no fluctuance, No symptoms of anaphylaxis. Exam not suggestive of abscess or cellulitis no swelling to suggest DVT.  She has full range of motion of right upper extremity and is neurovascularly intact distally.  Encouraged patient to continue cold compress, Claritin and pepcid. Can take tylenol for pain if needed. - Encourage patient to return to clinic if symptoms do not improve.       Heme/Onc History: Oncology History Overview Note  Neg genetics   Malignant germ cell tumor of ovary (HCC)  12/01/2021 Tumor Marker   CA-125 is elevated at 285, [place]-fet[place] [address], beta-hCG is elevated at 116   12/02/2021 Imaging   MRI pelvis  1. 21.1 by 10.9 by 18.7 cm abdominopelvic mass, with a satellite mass in the right upper quadrant anteriorly, and moderate ascites with tumor deposits along the pelvic ascites posteriorly. The mass could be arising from right or  left ovary, or both. The mass displaces the bowel and IVC, but there is no IVC thrombus or pelvic DVT identified. 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 2. Additional satellite mass above the liver is partially included, along with tumor deposits along the ascites. 3. Low-level edema along the lateral abdominal wall musculature. 4. Flattening of the IVC at the level of the aortic bifurcation due to the large abdominopelvic tumor. No findings of pelvic DVT.     12/07/2021 Imaging   CT abdomen and pelvis 1. Large central abdominal and pelvic mass consistent with known malignancy. This is incompletely visualized by this CT of the abdomen, but was seen on recent pelvic MRI. 2. 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 Right omental implant consistent with metastatic disease. Small amount of abdominal ascites. 3. No evidence for bowel or ureteral obstruction. 4. Aortic Atherosclerosis (ICD10-I70.0). 5. Chest findings dictated separately.   12/07/2021 Imaging   CT chest 1. Negative for pulmonary embolism. And no acute or metastatic process identified in the Chest. 2. Partially visible liver mass and ascites, staging CT Abdomen today is reported separately.     12/07/2021 Procedure   Successful placement of a right internal jugular approach power injectable Port-A-Cath. The catheter is ready for immediate use.   12/07/2021 Procedure   Ultrasound-guided biopsy of right upper quadrant mass.    12/08/2021 Initial Diagnosis   Ovarian cancer (HCC)   12/08/2021  Cancer Staging   Staging form: Ovary, Fallopian Tube, and Primary Peritoneal Carcinoma, AJCC 8th Edition - Clinical stage from 12/08/2021: FIGO Stage IIIC (cT3c, cN0, cM0) - Signed by [name], MD on 12/10/2021 Stage prefix: Initial diagnosis   12/20/2021 - 12/28/2021 Chemotherapy   Patient is on Treatment Plan :  Ovarian germ cell tumor q21d x 3 Cycles     01/03/2022 Imaging   IMPRESSION: 1. Large central abdominopelvic mass compatible with malignancy, 3450 cubic cm in volume. This may be arising from the ovaries with slight eccentricity to the right in the pelvis. 2. Mild increase in size of the right omental tumor deposit. 3. Mild increase in size of the subcapsular mass along the dome of the right hepatic lobe, with suspected hepatic invasion. 4. Scattered malignant ascites. 5. No dilated bowel to suggest obstruction. 6. 2 mm left kidney lower pole nonobstructive renal calculus. 7. Mild abdominal aortic atherosclerotic vascular disease.   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  Postoperative Diagnosis: Status post R0 cytoreductive surgery and heating intraperitoneal chemotherapy  Procedures: LAPAROSCOPY, ABDOMEN, PERITONEUM, & OMENTUM, DIAGNOSTIC, W/WO COLLECTION SPECIMEN(S) BY BRUSHING OR WASHING  EXCISION/DESTRUCTION, OPEN, INTRA-ABD TUMOR/CYST/ENDOMETRIOMA, 1+ PERITONEAL/MESENTERI/RETROPERIT 5CM OR <COLECTOMY, PARTIAL; WITH ANASTOMOSIS COLOSTOMY OR SKIN LEVEL CECOSTOMY SPLENECTOMY; TOT (SEPART PROC) EXPLORATORY LAPAROTOMY, EXPLORATORY CELIOTOMY WITH OR WITHOUT BIOPSY(S) HYPERTHERMIA, EXTERNALLY GENERATED; DEEP CHEMOTHERAPY ADMINISTRATION INTO THE PERITONEAL CAVITY VIA INDWELLING PORT OR CATHETER RESECTION (INITIAL) OVARIAN, TUBAL/PRIM PERITONEAL MALIG W/BIL S&O/OMENTECT; W/RAD DISSECTION FOR DEBULKING CHOLECYSTECTOMY APPENDECTOMY OMENTECTOMY, EPIPLOECTOMY, RESECTION OF OMENTUM Procedures: Exploratory laparotomy, total abdominal hysterectomy with radical dissection, bilateral ureterolysis, bilateral salpingo-oophorectomy, posterior cul-de-sac peritonectomy, tumor debulking by Dr Pricilla Holm with Gynecology Oncology Dr Vanessa Ralphs with surgical oncology: Diagnostic laparoscopy, exploratory laparotomy, low anterior  resection with primary end-to-end anastomosis, enterolysis, cholecystectomy, tumor capsule tumor debulking, splenectomy, appendectomy, omentectomy, diverting loop ileostomy formation  Surgeon: Eugene Garnet, MD  Findings: On diagnostic laparoscopy, only able to clearly visualize the left upper quadrant. Adhesions of bowel in the right upper quadrant limiting survey, minimal ascites noted in the pelvis, scattered tumor plaques visualized on left diaphragm. Given these findings, the decision was made to convert to exploratory laparotomy. Multiple loops of small bowel encased in a Cancn like structure in the midline with multiple filmy adhesions tethering to the superior aspect of the a large pelvic mass. Multicystic and necrotic pelvic mass originating from left ovary, approximately 18 x 20 cm incorporated within the left broad ligament with the left tube draped overlying the left ovarian mass. Bilateral tubes normal in appearance, normal-appearing uterus with smooth serosa. Right adnexa similarly with approximate 10 x 8 multicystic and necrotic mass. Diffuse tumor plaques encompassing posterior cul-de-sac peritoneum. Rectosigmoid densely adhered to posterior aspect of left adnexal mass requiring dissection and ultimately resection. Scattered treated subcentimeter nodules along small bowel mesentery. 5 x 5 cm necrotic and multicystic tumor at the level of the hepatic flexure. Enlarged similar multicystic necrotic tumor adherent between the right diaphragm and the liver without any evidence of liver invasion and tumor encased solely within the liver capsule. Multiple tumor nodules along the spleen requiring splenectomy. Omentum overall fairly normal in appearance with only scattered small tumor deposits, no omental cake was encountered. Please see surgical oncology operative note for further details regarding operative findings for their portion of the procedure.  Specimens: ID Type Source Tests Collected by  Time Destination 1 : Abdominal fat pad Tissue Abdomen SURGICAL PATHOLOGY EXAM  Marion Downer, MD 02/15/2022 [phone] 2 : Left adnexa Tissue Abdomen SURGICAL PATHOLOGY EXAM Marion Downer, MD 02/15/2022 [phone] 3 : right adnexa Tissue Abdomen SURGICAL PATHOLOGY EXAM Marion Downer, MD 02/15/2022 [phone] 4 : Left perimetrium Tissue Uterus SURGICAL PATHOLOGY EXAM Carver Fila, MD 02/15/[id number] 5 : Uterus and cervix Tissue Uterus SURGICAL PATHOLOGY EXAM Marion Downer, MD 02/15/2022 1018 6 : Low Anterior Resection Tissue Abdomen SURGICAL PATHOLOGY EXAM Marion Downer, MD 02/15/2022 1055 7 : Right peritoneum Tissue Abdomen SURGICAL PATHOLOGY EXAM Marion Downer, MD 02/15/2022 1058 8 : Right retroperitoneal mass Tissue Abdomen SURGICAL PATHOLOGY EXAM Marion Downer, MD 02/15/2022 1101 9 : Omentum Tissue Omentum SURGICAL PATHOLOGY EXAM Marion Downer, MD 02/15/2022 1130 10 : Gallbladder Tissue Gallbladder SURGICAL PATHOLOGY EXAM Marion Downer, MD 02/15/2022 1143 11 : Right diaphragm Tissue Diaphragm SURGICAL PATHOLOGY EXAM Marion Downer, MD 02/15/2022 1143 12 : Left diaphragm nodule Tissue Diaphragm SURGICAL PATHOLOGY EXAM Marion Downer, MD 02/15/2022 1157 13 : Spleen Tissue Spleen SURGICAL PATHOLOGY EXAM Marion Downer, MD 02/15/2022 1204 14 : Appendix Tissue Appendix SURGICAL PATHOLOGY EXAM Marion Downer, MD 02/15/2022 1213 15 : Small bowel mesenteric nodules Tissue Small Bowel SURGICAL PATHOLOGY EXAM Marion Downer, MD 02/15/2022 1215 16 : Colon mesenteric nodule Tissue Colon SURGICAL PATHOLOGY EXAM Marion Downer, MD 02/15/2022 1221 17 : Hepatic flexure mass Tissue Liver SURGICAL PATHOLOGY EXAM Marion Downer, MD 02/15/2022 1227  Complications: None  Indications for Procedure: Dillie Burandt is a 52 y.o. 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 Prior to the procedure, all risks, benefits, and alternatives were discussed and informed surgical consent was signed.  Procedure: Patient was taken to the operating room where general anesthesia was achieved. She was positioned in dorsal lithotomy and prepped and draped per the surgical oncology team. A foley catheter was inserted into the bladder. A 5 mm skin incision was made in the left upper quadrant at Palmer's point, the abdomen was then entered with direct Optiview entry and the abdomen was insufflated. Findings were noted as above.  The laparoscope was then removed from the abdomen, the pneumoperitoneum was maintained. A vertical midline incision was made with the scalpel and the abdomen was entered sharply. The abdomen and pelvis were surveyed with findings as documented above. A wound protector was placed followed by the [facility] retractor.  The enlarged left adnexal mass was then carefully removed of its bilateral sidewall attachments using a mixture of blunt and cautery dissection. Multiple small bowel loops were adhered to the superior and posterior aspect of the mass with filmy adhesions which were carefully bluntly resected. The rectosigmoid was noted to be densely adhered to the posterior aspect of the pelvic mass. Dissection was attempted using mixture of blunt dissection as well as sharp dissection with the Metzenbaum scissors, and unavoidable enterotomy of approximately 4 cm was made to the anterior surface of the rectosigmoid. This enterotomy was then closed with 4-0 PDS in a running fashion to minimize contamination. Further dissection removing the remainder of the rectosigmoid from the posterior aspect of the pelvic mass was then undertaken. The pelvic mass was then able to be fully elevated from the pelvis. The left infundibulopelvic ligament was able to be isolated with the left ureter clearly visualized below. The left  ureter was further dissected away from the medial leaf of the broad ligament, and  a vessel loop was placed. The left infundibulopelvic ligament was then isolated, clamped, and transected using the LigaSure device as well as and suture-ligated. The left utero-ovarian ligament was then isolated, clamped, cauterized and transected. The left pelvic mass was then removed entirely.  Attention was then turned to the right. An additional enlarged multicystic right adnexal mass was noted, this was carefully elevated from the pelvis using blunt dissection. The right round ligament was then grasped, elevated, and transected. The broad ligament was then opened posteriorly. The ureter was identified and the right infundibulopelvic ligament was then isolated, clamped, transected, and tied. The right utero ovarian ligament was then isolated, clamped, and transected using the LigaSure device and the right adnexal mass was removed.  Attention then was turned toward the hysterectomy. A sponge stick was placed within the vagina. The right broad ligament dissection was opened anteriorly and the bladder flap was developed. The left round ligament was then further transected, and the broad ligament opened posteriorly and anteriorly to complete the bladder flap. The right uterine artery was skeletonized, clamped, transected, and suture-ligated. There was significant parametrial tumor involvement on the left side, and therefore a radical dissection was required to isolate the blood supply. The left ureter was further dissected down to the level near the trigone. The left uterine artery was traced to its origin, isolated, and clamped with vascular clips. Similarly the left uterine vein due to significant necrotic tumor involvement was friable. Detachment was similarly carefully isolated, and clipped with vascular clips. Sequential clamps were then used to transect the remainder of the broad and cardinal ligaments bilaterally, with each  pedicle being suture-ligated. A large right angle clamp was then able to be placed below the cervix, and the uterus and cervix were amputated from the vagina. The vaginal cuff was then closed with several figure-of-eight sutures using 0 Vicryl.    02/15/2022 Pathology Results   A: Abdominal fat pad, excision - Fibroadipose tissue with residual mixed malignant germ cell tumor with treatment effect   B: Left adnexa, salpingo-oophorectomy - Residual mixed malignant germ cell tumor with treatment effect, [id number] (See Comment and Synoptic report) - FIGO stage IIIC - Fallopian tubes with no tumor present   C: Right adnexa, salpingo-oophorectomy - Residual mixed malignant germ cell tumor with treatment effect (See Synoptic report) - Fallopian tubes not identified   D: Left parametrium, biopsy - Fibroadipose tissue with residual mixed malignant germ cell tumor with treatment effect   E: Uterus and cervix, hysterectomy - Right adnexal soft tissue with residual mixed malignant germ cell tumor with treatment effect   Myometrium: - Adenomyosis - Leiomyomata measuring up to 0.7 cm - Extensive chronic serositis   Cervix:  - Ectocervix and endocervix with chronic cervicitis    Endometrium: - Inactive endometrium   F: Low anterior resection - Residual mixed malignant  germ cell tumor with treatment effect involving colonic serosa and pericolonic adipose tissue - Proximal and distal colonic resection margins with chronic serositis, but negative for tumor    G: Right peritoneum, biopsy - Fibroadipose tissue with residual mixed malignant germ cell tumor with treatment effect   H: Right retroperitoneal mass, excision - Fibroadipose tissue with residual mixed malignant germ cell tumor with treatment effect   I: Omentum, omentectomy - Fibroadipose tissue with residual mixed malignant germ cell tumor with treatment effect   J: Gallbladder, cholecystectomy - Gallbladder with mild chronic  serositis and fibrous adhesion   K: Right diaphragm, excision - Fibrous tissue with residual mixed malignant  germ cell tumor with treatment effect - Hepatic tissue with no tumor present   L: Left diaphragm nodule, biopsy - Fibrous tissue with residual mixed malignant germ cell tumor with treatment effect   M: Spleen, splenectomy - Residual mixed malignant germ cell tumor with treatment effect involving splenic capsule   N: Appendix, appendectomy - Appendix with fibrous obliteration at the tip - Periappendiceal adipose tissue with acute and chronic inflammation   O: Small bowel mesenteric nodules, excision - Fibroadipose tissue with residual mixed malignant germ cell tumor with treatment effect   P: Mesenteric nodules, excision - Fibroadipose tissue with residual mixed malignant germ cell tumor with treatment effect   Q: Hepatic flexure mass, excision - Fibroadipose tissue with residual mixed malignant germ cell tumor with treatment effect   R: Anvil and donut, biopsy - Benign segment of colon with acute and chronic inflammation and fibrous adhesion  SPECIMEN    Procedure:    Radical hysterectomy     Procedure:    Omentectomy     Procedure:    Peritoneal tumor debulking     Hysterectomy Type:    Abdominal     Specimen Integrity:          Right Ovary Integrity:    Capsule intact     Specimen Integrity:          Left Ovary Integrity:    Capsule intact   TUMOR    Tumor Site:    Bilateral ovaries     Tumor Size:    Greatest Dimension (Centimeters): 19.5 cm     Histologic Type:    Mixed malignant germ cell tumor:  yolk sac tumor, mature teratoma, mature neural elements, immature neural tissue, and immature cartilage     Ovarian Surface Involvement:    Present, right and left     Other Tissue / Organ Involvement:    Right ovary     Other Tissue / Organ Involvement:    Left ovary     Other Tissue / Organ Involvement:    Right fallopian tube     Other Tissue / Organ Involvement:     Omentum     Other Tissue / Organ Involvement:    Diaphragm and splenic capsule     Largest Extrapelvic Peritoneal Focus:    Macroscopic (greater than 2 cm): Splenic capsule     Peritoneal / Ascitic Fluid Involvement:    Not submitted / unknown   REGIONAL LYMPH NODES     Regional Lymph Node Status:    Not applicable (no regional lymph nodes submitted or found)   DISTANT METASTASIS     Distant Site(s) Involved:    Diaphragm, splenic capsule   pTNM CLASSIFICATION (AJCC 8th Edition)     Reporting of pT, pN, and (when applicable) pM categories is based on information available to the pathologist at the time the report is issued. As per the AJCC (Chapter 1, 8th Ed.) it is the managing physician's responsibility to establish the final pathologic stage based upon all pertinent information, including but potentially not limited to this pathology report.     Modified Classification:    y     pT Category:    pT3c     pN Category:    pN not assigned (no nodes submitted or found)     pM Category:    pM1   FIGO STAGE     FIGO Stage:    IIIC   COMMENT:  The needle core biopsies show a heterogeneous  combination of elements including abundant neural tissue with atypical and immature features. There are also atypical glandular epithelial structures and focal yolk sac elements with microcystic pattern.  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  Immunohistochemistry is performed for cytokeratin AE1/AE3, CD117, CD30, G FAP, glypican-3, OCT3/4, synaptophysin, S100 and CD30.     03/14/2022 -  Chemotherapy   Patient is on Treatment Plan : Malignant germ cell tumor of ovary D1-5 q21d     03/22/2022 Tumor Marker   Patient's tumor was tested for the following markers: AFP. Results of the tumor marker test revealed 7.9.   04/23/2022 Genetic Testing   Negative genetic testing on the CancerNext-Expanded+RNAinsight panel.  The report  date is April 23, 2022.  The CancerNext-Expanded gene panel offered by [facility] and includes sequencing and rearrangement analysis for the following 77 genes: AIP, ALK, APC*, ATM*, AXIN2, BAP1, BARD1, BMPR1A, BRCA1*, BRCA2*, BRIP1*, CDC73, CDH1*, CDK4, CDKN1B, CDKN2A, CHEK2*, CTNNA1, DICER1, FH, FLCN, KIF1B, LZTR1, MAX, MEN1, MET, MLH1*, MSH2*, MSH3, MSH6*, MUTYH*, NF1*, NF2, NTHL1, PALB2*, PHOX2B, PMS2*, POT1, PRKAR1A, PTCH1, PTEN*, RAD51C*, RAD51D*, RB1, RET, SDHA, SDHAF2, SDHB, SDHC, SDHD, SMAD4, SMARCA4, SMARCB1, SMARCE1, STK11, SUFU, TMEM127, TP53*, TSC1, TSC2, and VHL (sequencing and deletion/duplication); EGFR, EGLN1, HOXB13, KIT, MITF, PDGFRA, POLD1, and POLE (sequencing only); EPCAM and GREM1 (deletion/duplication only). DNA and RNA analyses performed for * genes.    05/17/2022 Imaging   CT ABDOMEN PELVIS W WO CONTRAST  Result Date: 05/17/2022 CLINICAL DATA:  History of ovarian cancer status post recent section and completion of chemotherapy 1-1/2 weeks ago. Evaluation of possible ileostomy reversal. EXAM: CT ABDOMEN AND PELVIS WITHOUT AND WITH CONTRAST TECHNIQUE: Multidetector CT imaging of the abdomen and pelvis was performed following the standard protocol before and following the bolus administration of intravenous contrast. RADIATION DOSE REDUCTION: This exam was performed according to the departmental dose-optimization program which includes automated exposure control, adjustment of the mA and/or kV according to patient size and/or use of iterative reconstruction technique. CONTRAST:  OMNIPAQUE IOHEXOL 300 MG/ML  SOLN COMPARISON:  CT abdomen and pelvis dated 02/09/2022 FINDINGS: Lower chest: No focal consolidation or pulmonary nodule in the lung bases. No pleural effusion or pneumothorax demonstrated. Partially imaged heart size is normal. Hepatobiliary: Postsurgical changes of the right hepatic dome with irregular capsular retraction. Punctate adjacent focus of subdiaphragmatic  calcification (11:32). Unchanged subcentimeter hypoattenuating focus along posterior segment 7 (4:18). No intra or extrahepatic biliary ductal dilation. Cholecystectomy. Pancreas: No focal lesions or main ductal dilation. Spleen: Splenectomy. Lobulated, enhancing soft tissue density along the left hemidiaphragm at the splenectomy site measures 1.5 x 1.0 cm (4:11). Adrenals/Urinary Tract: No adrenal nodules. No suspicious renal mass or hydronephrosis. Punctate nonobstructing left lower pole stones. No focal bladder wall thickening. Stomach/Bowel: Normal appearance of the stomach. Right upper quadrant ileostomy. Enteric contrast material is present within the medial limb of the ileostomy. Appendectomy. Vascular/Lymphatic: Aortic atherosclerosis. No enlarged abdominal or pelvic lymph nodes. Reproductive: Hysterectomy and bilateral salpingo-oophorectomy. Ill-defined soft tissue near the left vaginal (4:66). No adnexal masses. Other: Status post omentectomy. Ill-defined lateral peritoneal thickening, for example series 4 image 38 on the right and image 36 on the left associated with scattered foci of mesenteric nodularity, for example 7 x 5 mm (4:34). Musculoskeletal: No acute or abnormal lytic or blastic osseous lesions. Postsurgical changes of the anterior abdominal wall. IMPRESSION: 1. Extensive postsurgical changes of the abdomen and pelvis. Right upper quadrant ileostomy  with enteric contrast material present within the medial limb of the ileostomy. No evidence of obstruction. 2. Multifocal ill-defined soft tissue densities as described, indeterminate, may reflect postsurgical change versus residual tumor. 3. Lobulated, enhancing soft tissue density along the left hemidiaphragm at the splenectomy site may represent residual/recurrent splenic tissue versus residual tumor, although the enhancement pattern does not appear consistent with pre-surgical appearance of the malignancy. 4.  Aortic Atherosclerosis  (ICD10-I70.0). Electronically Signed   By: Agustin Cree M.D.   On: 05/17/2022 16:43      06/09/2022 Imaging   CT ABDOMEN PELVIS WO CONTRAST  Result Date: 06/09/2022 CLINICAL DATA:  Ovarian cancer, status post chemotherapy. Rectal contrast administered to evaluate for anastomotic patency prior to reversal. EXAM: CT ABDOMEN AND PELVIS WITHOUT CONTRAST TECHNIQUE: Multidetector CT imaging of the abdomen and pelvis was performed following the standard protocol without IV contrast. RADIATION DOSE REDUCTION: This exam was performed according to the departmental dose-optimization program which includes automated exposure control, adjustment of the mA and/or kV according to patient size and/or use of iterative reconstruction technique. COMPARISON:  05/17/2022 FINDINGS: Lower chest: Lung bases are clear. Hepatobiliary: Unenhanced liver is unremarkable. Status post cholecystectomy. No intrahepatic or extrahepatic ductal dilatation. Pancreas: Within normal limits. 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 Adrenals/Urinary Tract: Adrenal glands are within normal limits. Right kidney is within normal limits. 3 mm nonobstructing left lower pole renal calculus (series 2/image 22). No hydronephrosis. Bladder is within normal limits. Stomach/Bowel: Stomach is within normal limits. No evidence of bowel obstruction. 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 Status post left hemicolectomy with suture line in the lower pelvis (series 2/image 87). Rectal contrast confirms a patent anastomosis without evidence of leak. Vascular/Lymphatic: No evidence of abdominal aortic aneurysm. Atherosclerotic calcifications of the abdominal aorta and branch vessels. No suspicious abdominopelvic lymphadenopathy. Reproductive: Status post hysterectomy and suspected bilateral salpingo oophorectomy. Other: No abdominopelvic ascites. Mild peritoneal nodularity along the lateral left mid abdomen, with discrete 8 mm nodule  (series 2/image 33), more conspicuous than on the prior. Mild nonspecific mesenteric stranding along the jejunal mesentery in central left mid abdomen (series 2/image 30). Mildly prominent soft tissue along the left pelvic cul-de-sac measuring up to 2.0 cm (series 2/image 59), grossly unchanged. Overall appearance suggests very mild peritoneal disease which is overall grossly unchanged. Musculoskeletal: Visualized osseous structures are within normal limits. 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 Rectal contrast confirms a patent anastomosis without evidence of leak. 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 Additional ancillary findings as above. Electronically Signed   By: Charline Bills M.D.   On: 06/09/2022 01:59   CT ABDOMEN PELVIS W WO CONTRAST  Result Date: 05/17/2022 CLINICAL DATA:  History of ovarian cancer status post recent section and completion of chemotherapy 1-1/2 weeks ago. Evaluation of possible ileostomy reversal. EXAM: CT ABDOMEN AND PELVIS WITHOUT AND WITH CONTRAST TECHNIQUE: Multidetector CT imaging of the abdomen and pelvis was performed following the standard protocol before and following the bolus administration of intravenous contrast. RADIATION DOSE REDUCTION: This exam was performed according to the departmental dose-optimization program which includes automated exposure control, adjustment of the mA and/or kV according to patient size and/or use of iterative reconstruction technique. CONTRAST:  OMNIPAQUE IOHEXOL 300 MG/ML  SOLN COMPARISON:  CT abdomen and pelvis dated 02/09/2022 FINDINGS: Lower chest: No focal consolidation or pulmonary nodule in the lung bases. No pleural effusion or pneumothorax demonstrated. Partially imaged heart size  is normal. Hepatobiliary: Postsurgical changes of the right hepatic dome with irregular capsular retraction. Punctate adjacent focus of subdiaphragmatic  calcification (11:32). Unchanged subcentimeter hypoattenuating focus along posterior segment 7 (4:18). No intra or extrahepatic biliary ductal dilation. Cholecystectomy. Pancreas: No focal lesions or main ductal dilation. Spleen: Splenectomy. Lobulated, enhancing soft tissue density along the left hemidiaphragm at the splenectomy site measures 1.5 x 1.0 cm (4:11). Adrenals/Urinary Tract: No adrenal nodules. No suspicious renal mass or hydronephrosis. Punctate nonobstructing left lower pole stones. No focal bladder wall thickening. Stomach/Bowel: Normal appearance of the stomach. Right upper quadrant ileostomy. Enteric contrast material is present within the medial limb of the ileostomy. Appendectomy. Vascular/Lymphatic: Aortic atherosclerosis. No enlarged abdominal or pelvic lymph nodes. Reproductive: Hysterectomy and bilateral salpingo-oophorectomy. Ill-defined soft tissue near the left vaginal (4:66). No adnexal masses. Other: Status post omentectomy. Ill-defined lateral peritoneal thickening, for example series 4 image 38 on the right and image 36 on the left associated with scattered foci of mesenteric nodularity, for example 7 x 5 mm (4:34). Musculoskeletal: No acute or abnormal lytic or blastic osseous lesions. Postsurgical changes of the anterior abdominal wall. IMPRESSION: 1. Extensive postsurgical changes of the abdomen and pelvis. Right upper quadrant ileostomy with enteric contrast material present within the medial limb of the ileostomy. No evidence of obstruction. 2. Multifocal ill-defined soft tissue densities as described, indeterminate, may reflect postsurgical change versus residual tumor. 3. Lobulated, enhancing soft tissue density along the left hemidiaphragm at the splenectomy site may represent residual/recurrent splenic tissue versus residual tumor, although the enhancement pattern does not appear consistent with pre-surgical appearance of the malignancy. 4.  Aortic Atherosclerosis  (ICD10-I70.0). Electronically Signed   By: Agustin Cree M.D.   On: 05/17/2022 16:43          Interval history-: JACQULIN BRANDENBURGER is a 52 y.o. female with oncologic history as above presenting to [facility] today with chief complaint of right arm pain. She presents unaccompanied to clinic today.  Patient states she had her meningococcal booster on 06/09/2022.  She notes that the injection site bled afterwards.  She later noticed a large knot around the injection site redness on her upper arm, and swelling.  The redness extended to her elbow.  She first took Benadryl and did not feel that symptoms improved much.  Yesterday she started taking Pepcid and Claritin and thinks that symptoms are now improving.  The area also itches.  The swelling has drastically improved.  Patient denies any fever, chills, decreased range of motion, nausea, vomiting, difficulty breathing.  Patient is scheduled for surgical procedure next week and wants to make sure that there will be no complications leading up to it.    ROS  All other systems are reviewed and are negative for acute change except as noted in the HPI.    Allergies  Allergen Reactions   Latex Other (See Comments)    Sensitivity only   Sulfa Antibiotics Hives and Other (See Comments)   Sulfamethoxazole-Trimethoprim Rash   Septra [Bactrim] Rash     Past Medical History:  Diagnosis Date   Arthritis    Endometriosis    Family history of breast cancer    Family history of breast cancer in mother    at age 80   GAD (generalized anxiety disorder)    lexapro helped 2017 (Dr. Emmit Alexanders did have extra stress of the Master's degree program on her at that time.  Got counseling.       GERD (gastroesophageal reflux disease)  Gestational diabetes    Kidney stones    Low back pain    Dr. Terrilee Files.   Neck pain    and left shoulder pain: managed by osteopathic manipulation by Dr. Terrilee Files. ?cervical radiculopathy   ovarian ca 12/01/2021   PAC (premature  atrial contraction)      Past Surgical History:  Procedure Laterality Date   ABDOMINAL EXPLORATION SURGERY  2009   fulguration of endometriosis - [facility] surgery   IR IMAGING GUIDED PORT INSERTION  12/08/2021   IR US GUIDE BX ASP/DRAIN  12/08/2021   SALPINGECTOMY Right 2009   TONSILLECTOMY     TUBAL LIGATION Left 2009    Social History   Socioeconomic History   Marital status: Married    Spouse name: Not on file   Number of children: 3   Years of education: Not on file   Highest education level: Not on file  Occupational History   Occupation: nurse    Employer: [employer]    Comment: at [employer]   Tobacco Use   Smoking status: Never   Smokeless tobacco: Never  Vaping Use   Vaping Use: Never used  Substance and Sexual Activity   Alcohol use: Yes    Comment: Occasionally   Drug use: No   Sexual activity: Yes    Partners: Male    Birth control/protection: Surgical  Other Topics Concern   Not on file  Social History Narrative   Married, 3 kids.    Lives in [place].   Educ: Masters deg nursing.   Pt works as a Systems developer at [employer] with husband Minerva Areola and three kids (Will, [place], and [place]).   No tob/drugs.   Alc: social.   Social Determinants of Health   Financial Resource Strain: Not on file  Food Insecurity: No Food Insecurity (01/05/2022)   Hunger Vital Sign    Worried About Running Out of Food in the Last Year: Never true    Ran Out of Food in the Last Year: Never true  Transportation Needs: No Transportation Needs (01/05/2022)   PRAPARE - Administrator, Civil Service (Medical): No    Lack of Transportation (Non-Medical): No  Physical Activity: Not on file  Stress: Not on file  Social Connections: Not on file  Intimate Partner Violence: Not At Risk (01/05/2022)   Humiliation, Afraid, Rape, and Kick questionnaire    Fear of Current or Ex-Partner: No    Emotionally Abused: No    Physically Abused: No    Sexually Abused:  No    Family History  Problem Relation Age of Onset   Rheum arthritis Mother    Stroke Mother    Hypertension Mother    Heart disease Mother    Arthritis Mother    Breast cancer Mother 13   Heart attack Mother 5   Diabetes Father    Heart disease Father    Hyperlipidemia Father    Hypertension Father    Heart attack Father 29   Non-Hodgkin's lymphoma Paternal Uncle 68   Heart disease Maternal Grandmother    Heart attack Maternal Grandmother 32   Arthritis Maternal Grandmother    Early death Maternal Grandfather 30       MVA   Diabetes Paternal Grandmother    Hypertension Paternal Grandmother    Heart disease Paternal Grandmother    Heart attack Paternal Grandmother 4   Heart disease Paternal Grandfather    Heart attack Paternal Grandfather 79  Coronary artery disease Other    Glaucoma Other      Current Outpatient Medications:    acetaminophen (TYLENOL) 650 MG CR tablet, Take 650 mg by mouth every 8 (eight) hours as needed for pain., Disp: , Rfl:    Cholecalciferol (VITAMIN D) 50 MCG (2000 UT) tablet, Take 2,000 Units by mouth daily., Disp: , Rfl:    diphenhydrAMINE (BENADRYL) 25 MG tablet, Take 25 mg by mouth every 6 (six) hours as needed for allergies., Disp: , Rfl:    famotidine (PEPCID) 10 MG tablet, Take 10 mg by mouth daily as needed for heartburn or indigestion., Disp: , Rfl:    LORazepam (ATIVAN) 0.5 MG tablet, Take 1 tablet (0.5 mg total) by mouth every 8 (eight) hours. (Patient taking differently: Take 0.5 mg by mouth every 8 (eight) hours as needed for anxiety.), Disp: 30 tablet, Rfl: 0   pregabalin (LYRICA) 25 MG capsule, Take 1 capsule (25 mg) by mouth at bedtime. (Patient not taking: Reported on 06/13/2022), Disp: 90 capsule, Rfl: 1   simethicone (MYLICON) 125 MG chewable tablet, Chew 125 mg by mouth every 6 (six) hours as needed for flatulence., Disp: , Rfl:    traMADol (ULTRAM) 50 MG tablet, Take 1 tablet (50 mg total) by mouth every 6 (six) hours as needed  for severe pain., Disp: 30 tablet, Rfl: 0  PHYSICAL EXAM: ECOG FS:1 - Symptomatic but completely ambulatory    Vitals:   06/15/22 0825  BP: 128/79  Pulse: 96  Resp: 18  Temp: 98.2 F (36.8 C)  TempSrc: Oral  SpO2: 100%  Weight: 108 lb (49 kg)   Physical Exam Vitals and nursing note reviewed.  Constitutional:      Appearance: She is not ill-appearing or toxic-appearing.  HENT:     Head: Normocephalic.  Eyes:     Conjunctiva/sclera: Conjunctivae normal.  Cardiovascular:     Rate and Rhythm: Normal rate.     Pulses: Normal pulses.          Radial pulses are 2+ on the right side.  Pulmonary:     Effort: Pulmonary effort is normal.  Abdominal:     General: There is no distension.  Musculoskeletal:        General: Normal range of motion.     Cervical back: Normal range of motion.     Comments: Compartments soft in right upper extremity.  Erythema over lying right bicep, not circumferential.  Bruising around injection site and palpable hard knot.  No fluctuance.  Nontender to palpation.  Skin:    General: Skin is warm and dry.     Capillary Refill: Capillary refill takes less than 2 seconds.  Neurological:     Mental Status: She is alert.        LABORATORY DATA: I have reviewed the data as listed    Latest Ref Rng & Units 05/17/2022   10:20 AM 05/09/2022    7:43 AM 05/02/2022    8:27 AM  CBC  WBC 4.0 - 10.5 K/uL 31.0  40.8  8.1   Hemoglobin 12.0 - 15.0 g/dL 16.1  9.3  09.6   Hematocrit 36.0 - 46.0 % 33.1  28.1  33.9   Platelets 150 - 400 K/uL 354  223  379         Latest Ref Rng & Units 04/18/2022   12:02 PM 04/01/2022   11:47 AM 03/21/2022    8:35 AM  CMP  Glucose 70 - 99 mg/dL 81  98  86  BUN 6 - 20 mg/dL 10  11  23    Creatinine 0.44 - 1.00 mg/dL 8.46  9.62  9.52   Sodium 135 - 145 mmol/L 140  139  137   Potassium 3.5 - 5.1 mmol/L 3.7  4.3  4.6   Chloride 98 - 111 mmol/L 104  104  99   CO2 22 - 32 mmol/L 28  30  28    Calcium 8.9 - 10.3 mg/dL 9.4  9.6   9.9   Total Protein 6.5 - 8.1 g/dL 6.4  6.8  6.7   Total Bilirubin 0.3 - 1.2 mg/dL 0.2  0.3  0.5   Alkaline Phos 38 - 126 U/L 87  69  69   AST 15 - 41 U/L 38  13  16   ALT 0 - 44 U/L 28  21  38        RADIOGRAPHIC STUDIES (from last 24 hours if applicable) I have personally reviewed the radiological images as listed and agreed with the findings in the report. No results found.      Visit Diagnosis: 1. Malignant germ cell tumor of ovary, unspecified laterality (HCC)   2. Pain of right upper extremity      No orders of the defined types were placed in this encounter.   All questions were answered. The patient knows to call the clinic with any problems, questions or concerns. No barriers to learning was detected.  A total of more than 10 minutes were spent on this encounter with face-to-face time and non-face-to-face time, including preparing to see the patient, counseling the patient and coordination of care as outlined above.    Thank you for allowing me to participate in the care of this patient.    Shanon Ace, PA-C Department of Hematology/Oncology [facility] Phone: [phone]  [phone]    06/15/2022 9:30 AM

## 2022-06-14 NOTE — Progress Notes (Signed)
PCP - Antony Haste, MD Cardiologist - Lynnette Caffey, MD  PPM/ICD -  Device Orders -  Rep Notified -   Chest x-ray -  EKG -  Stress Test -  ECHO -  Cardiac Cath -   Sleep Study -  CPAP -   Fasting Blood Sugar -  Checks Blood Sugar _____ times a day  Blood Thinner Instructions: Aspirin Instructions:  ERAS Protcol -y no drink PRE-SURGERY    COVID vaccine -  Activity--Able to climb stairs with no SOB or CP Anesthesia review: ovarian cancer   Patient denies shortness of breath, fever, cough and chest pain at PAT appointment   All instructions explained to the patient, with a verbal understanding of the material. Patient agrees to go over the instructions while at home for a better understanding. Patient also instructed to self quarantine after being tested for COVID-19. The opportunity to ask questions was provided.

## 2022-06-14 NOTE — Telephone Encounter (Signed)
Pt called and states she had the Meningococcal B, OMV in her right arm 4/25 and since, has a large knot to her right deltoid and redness extending from injection sight to right elbow. She reports she is taking Benadryl @ HS as well as Pepcid. This has helped slightly but sx have not subsided. She wants to know if she needs to come in to be seen or if she should continue Bendryl & pepcid. She is scheduled with [name], NP for ostomy reversal 5/1 and wants to make sure this is not going to impact her appt.

## 2022-06-14 NOTE — Telephone Encounter (Signed)
Pt is in agreement with 0830 [facility] visit 06/15/22. Appt added.

## 2022-06-14 NOTE — Telephone Encounter (Signed)
I am on call today with multiple consults, I cannot add her Can you ask if [facility] can add her to be evaluated?

## 2022-06-15 ENCOUNTER — Telehealth: Payer: Self-pay | Admitting: Oncology

## 2022-06-15 ENCOUNTER — Encounter (HOSPITAL_COMMUNITY): Payer: Self-pay

## 2022-06-15 ENCOUNTER — Other Ambulatory Visit: Payer: Self-pay

## 2022-06-15 ENCOUNTER — Inpatient Hospital Stay: Payer: 59 | Attending: Gynecologic Oncology | Admitting: Physician Assistant

## 2022-06-15 ENCOUNTER — Encounter (HOSPITAL_COMMUNITY)
Admission: RE | Admit: 2022-06-15 | Discharge: 2022-06-15 | Disposition: A | Discharge status: Home / Self Care | Payer: 59 | Source: Ambulatory Visit | Attending: Gynecologic Oncology | Admitting: Gynecologic Oncology

## 2022-06-15 VITALS — BP 128/79 | HR 96 | Temp 98.2°F | Resp 18 | Wt 108.0 lb

## 2022-06-15 DIAGNOSIS — Z932 Ileostomy status: Secondary | ICD-10-CM

## 2022-06-15 DIAGNOSIS — M79601 Pain in right arm: Secondary | ICD-10-CM | POA: Diagnosis not present

## 2022-06-15 DIAGNOSIS — Z9221 Personal history of antineoplastic chemotherapy: Secondary | ICD-10-CM | POA: Insufficient documentation

## 2022-06-15 DIAGNOSIS — C563 Malignant neoplasm of bilateral ovaries: Secondary | ICD-10-CM | POA: Insufficient documentation

## 2022-06-15 DIAGNOSIS — C569 Malignant neoplasm of unspecified ovary: Secondary | ICD-10-CM

## 2022-06-15 DIAGNOSIS — Z01812 Encounter for preprocedural laboratory examination: Secondary | ICD-10-CM | POA: Insufficient documentation

## 2022-06-15 HISTORY — DX: Nausea with vomiting, unspecified: R11.2

## 2022-06-15 HISTORY — DX: Other specified postprocedural states: Z98.890

## 2022-06-15 HISTORY — DX: Personal history of urinary calculi: Z87.442

## 2022-06-15 LAB — COMPREHENSIVE METABOLIC PANEL
ALT: 34 U/L (ref 0–44)
AST: 22 U/L (ref 15–41)
Albumin: 4.1 g/dL (ref 3.5–5.0)
Alkaline Phosphatase: 49 U/L (ref 38–126)
Anion gap: 9 (ref 5–15)
BUN: 12 mg/dL (ref 6–20)
CO2: 27 mmol/L (ref 22–32)
Calcium: 9.7 mg/dL (ref 8.9–10.3)
Chloride: 104 mmol/L (ref 98–111)
Creatinine, Ser: 0.64 mg/dL (ref 0.44–1.00)
GFR, Estimated: >60 mL/min (ref >60)
Glucose, Bld: 92 mg/dL (ref 70–99)
Potassium: 4.7 mmol/L (ref 3.5–5.1)
Sodium: 140 mmol/L (ref 135–145)
Total Bilirubin: 0.7 mg/dL (ref 0.3–1.2)
Total Protein: 7 g/dL (ref 6.5–8.1)

## 2022-06-15 LAB — CBC WITH DIFFERENTIAL/PLATELET
Abs Immature Granulocytes: 0.05 10*3/uL (ref 0.00–0.07)
Basophils Absolute: 0.1 10*3/uL (ref 0.0–0.1)
Basophils Relative: 1 %
Eosinophils Absolute: 1.1 10*3/uL — ABNORMAL HIGH (ref 0.0–0.5)
Eosinophils Relative: 11 %
HCT: 37.9 % (ref 36.0–46.0)
Hemoglobin: 12.2 g/dL (ref 12.0–15.0)
Immature Granulocytes: 1 %
Lymphocytes Relative: 14 %
Lymphs Abs: 1.5 10*3/uL (ref 0.7–4.0)
MCH: 33 pg (ref 26.0–34.0)
MCHC: 32.2 g/dL (ref 30.0–36.0)
MCV: 102.4 fL — ABNORMAL HIGH (ref 80.0–100.0)
Monocytes Absolute: 1.1 10*3/uL — ABNORMAL HIGH (ref 0.1–1.0)
Monocytes Relative: 11 %
Neutro Abs: 6.5 10*3/uL (ref 1.7–7.7)
Neutrophils Relative %: 62 %
Platelets: 341 10*3/uL (ref 150–400)
RBC: 3.7 MIL/uL — ABNORMAL LOW (ref 3.87–5.11)
RDW: 15.3 % (ref 11.5–15.5)
WBC: 10.3 10*3/uL (ref 4.0–10.5)
nRBC: 0 % (ref 0.0–0.2)

## 2022-06-15 LAB — TYPE AND SCREEN

## 2022-06-15 NOTE — Telephone Encounter (Signed)
Called [name] and she would like a referral to an ENT for hearing loss.  She also would like to schedule an appointment with Dr. Bertis Ruddy.  Appointment scheduled on 07/15/22 at 12:00.

## 2022-06-15 NOTE — Telephone Encounter (Signed)
Left a message for the referral coordinator at [facility].

## 2022-06-16 ENCOUNTER — Encounter: Payer: Self-pay | Admitting: Hematology and Oncology

## 2022-06-16 NOTE — Telephone Encounter (Signed)
Talked to the referral coordinator and faxed referral to [phone].

## 2022-06-21 ENCOUNTER — Telehealth: Payer: Self-pay

## 2022-06-21 NOTE — Telephone Encounter (Signed)
Telephone call to check on pre-operative status.  Patient compliant with pre-operative instructions.  Reinforced nothing to eat after midnight. Clear liquids until 1145. Patient to arrive at 1245.  No questions or concerns voiced.  Instructed to call for any needs. 

## 2022-06-22 ENCOUNTER — Inpatient Hospital Stay (HOSPITAL_COMMUNITY)
Admission: RE | Admit: 2022-06-22 | Discharge: 2022-06-24 | DRG: 331 | Disposition: A | Discharge status: Home / Self Care | Payer: 59 | Source: Ambulatory Visit | Attending: Gynecologic Oncology | Admitting: Gynecologic Oncology

## 2022-06-22 ENCOUNTER — Encounter (HOSPITAL_COMMUNITY)
Admission: RE | Disposition: A | Discharge status: Home / Self Care | Payer: Self-pay | Source: Ambulatory Visit | Attending: Gynecologic Oncology

## 2022-06-22 ENCOUNTER — Other Ambulatory Visit: Payer: Self-pay

## 2022-06-22 ENCOUNTER — Inpatient Hospital Stay (HOSPITAL_COMMUNITY): Payer: 59 | Admitting: Anesthesiology

## 2022-06-22 ENCOUNTER — Encounter (HOSPITAL_COMMUNITY): Payer: Self-pay | Admitting: Gynecologic Oncology

## 2022-06-22 DIAGNOSIS — Z79899 Other long term (current) drug therapy: Secondary | ICD-10-CM | POA: Diagnosis not present

## 2022-06-22 DIAGNOSIS — Z833 Family history of diabetes mellitus: Secondary | ICD-10-CM

## 2022-06-22 DIAGNOSIS — Z432 Encounter for attention to ileostomy: Secondary | ICD-10-CM

## 2022-06-22 DIAGNOSIS — Z8261 Family history of arthritis: Secondary | ICD-10-CM

## 2022-06-22 DIAGNOSIS — C579 Malignant neoplasm of female genital organ, unspecified: Secondary | ICD-10-CM

## 2022-06-22 DIAGNOSIS — F411 Generalized anxiety disorder: Secondary | ICD-10-CM | POA: Diagnosis present

## 2022-06-22 DIAGNOSIS — Z8249 Family history of ischemic heart disease and other diseases of the circulatory system: Secondary | ICD-10-CM | POA: Diagnosis not present

## 2022-06-22 DIAGNOSIS — Z823 Family history of stroke: Secondary | ICD-10-CM

## 2022-06-22 DIAGNOSIS — Z8543 Personal history of malignant neoplasm of ovary: Secondary | ICD-10-CM

## 2022-06-22 DIAGNOSIS — Z9221 Personal history of antineoplastic chemotherapy: Secondary | ICD-10-CM | POA: Diagnosis not present

## 2022-06-22 DIAGNOSIS — K219 Gastro-esophageal reflux disease without esophagitis: Secondary | ICD-10-CM | POA: Diagnosis present

## 2022-06-22 DIAGNOSIS — C569 Malignant neoplasm of unspecified ovary: Secondary | ICD-10-CM

## 2022-06-22 DIAGNOSIS — Z803 Family history of malignant neoplasm of breast: Secondary | ICD-10-CM | POA: Diagnosis not present

## 2022-06-22 DIAGNOSIS — Z91040 Latex allergy status: Secondary | ICD-10-CM

## 2022-06-22 DIAGNOSIS — Z882 Allergy status to sulfonamides status: Secondary | ICD-10-CM

## 2022-06-22 DIAGNOSIS — Z807 Family history of other malignant neoplasms of lymphoid, hematopoietic and related tissues: Secondary | ICD-10-CM | POA: Diagnosis not present

## 2022-06-22 DIAGNOSIS — Z932 Ileostomy status: Secondary | ICD-10-CM

## 2022-06-22 DIAGNOSIS — Z83438 Family history of other disorder of lipoprotein metabolism and other lipidemia: Secondary | ICD-10-CM

## 2022-06-22 HISTORY — PX: ILEOSTOMY CLOSURE: SHX1784

## 2022-06-22 LAB — TYPE AND SCREEN
ABO/RH(D): B POS
Antibody Screen: NEGATIVE

## 2022-06-22 SURGERY — CLOSURE, ILEOSTOMY
Anesthesia: General

## 2022-06-22 MED ORDER — LIDOCAINE HCL (PF) 2 % IJ SOLN
INTRAMUSCULAR | Status: DC | PRN
  Administered 2022-06-22: 1.5 mg/kg/h via INTRADERMAL

## 2022-06-22 MED ORDER — BUPIVACAINE HCL 0.25 % IJ SOLN
INTRAMUSCULAR | Status: AC
  Filled 2022-06-22: qty 1

## 2022-06-22 MED ORDER — ROCURONIUM BROMIDE 10 MG/ML (PF) SYRINGE
PREFILLED_SYRINGE | INTRAVENOUS | Status: DC | PRN
  Administered 2022-06-22: 50 mg via INTRAVENOUS
  Administered 2022-06-22: 20 mg via INTRAVENOUS

## 2022-06-22 MED ORDER — KCL IN DEXTROSE-NACL 20-5-0.45 MEQ/L-%-% IV SOLN
INTRAVENOUS | Status: DC
  Filled 2022-06-22 (×2): qty 1000

## 2022-06-22 MED ORDER — FENTANYL CITRATE PF 50 MCG/ML IJ SOSY
25.0000 ug | PREFILLED_SYRINGE | INTRAMUSCULAR | Status: DC | PRN
  Administered 2022-06-22: 50 ug via INTRAVENOUS

## 2022-06-22 MED ORDER — CHEWING GUM (ORBIT) SUGAR FREE
1.0000 | CHEWING_GUM | Freq: Three times a day (TID) | ORAL | Status: DC
  Filled 2022-06-22: qty 1

## 2022-06-22 MED ORDER — KETOROLAC TROMETHAMINE 30 MG/ML IJ SOLN
INTRAMUSCULAR | Status: AC
  Filled 2022-06-22: qty 1

## 2022-06-22 MED ORDER — NON FORMULARY: 1.0000 [IU] | Freq: Three times a day (TID) | Status: DC

## 2022-06-22 MED ORDER — DEXAMETHASONE SODIUM PHOSPHATE 10 MG/ML IJ SOLN
INTRAMUSCULAR | Status: AC
  Filled 2022-06-22: qty 1

## 2022-06-22 MED ORDER — PROPOFOL 500 MG/50ML IV EMUL
INTRAVENOUS | Status: DC | PRN
  Administered 2022-06-22: 150 ug/kg/min via INTRAVENOUS

## 2022-06-22 MED ORDER — ENOXAPARIN SODIUM 40 MG/0.4ML IJ SOSY
40.0000 mg | PREFILLED_SYRINGE | INTRAMUSCULAR | Status: DC
  Administered 2022-06-23: 40 mg via SUBCUTANEOUS
  Filled 2022-06-22 (×2): qty 0.4

## 2022-06-22 MED ORDER — PHENYLEPHRINE 80 MCG/ML (10ML) SYRINGE FOR IV PUSH (FOR BLOOD PRESSURE SUPPORT)
PREFILLED_SYRINGE | INTRAVENOUS | Status: DC | PRN
  Administered 2022-06-22: 80 ug via INTRAVENOUS

## 2022-06-22 MED ORDER — MIDAZOLAM HCL 5 MG/5ML IJ SOLN
INTRAMUSCULAR | Status: DC | PRN
  Administered 2022-06-22: 2 mg via INTRAVENOUS

## 2022-06-22 MED ORDER — ONDANSETRON HCL 4 MG/2ML IJ SOLN
4.0000 mg | Freq: Four times a day (QID) | INTRAMUSCULAR | Status: DC | PRN
  Administered 2022-06-22: 4 mg via INTRAVENOUS
  Filled 2022-06-22: qty 2

## 2022-06-22 MED ORDER — DEXAMETHASONE SODIUM PHOSPHATE 4 MG/ML IJ SOLN: 4.0000 mg | INTRAMUSCULAR | Status: DC

## 2022-06-22 MED ORDER — LORAZEPAM 0.5 MG PO TABS
0.5000 mg | ORAL_TABLET | Freq: Three times a day (TID) | ORAL | Status: DC
  Administered 2022-06-22 – 2022-06-23 (×2): 0.5 mg via ORAL
  Filled 2022-06-22 (×3): qty 1

## 2022-06-22 MED ORDER — ORAL CARE MOUTH RINSE: 15.0000 mL | OROMUCOSAL | Status: DC | PRN

## 2022-06-22 MED ORDER — ONDANSETRON HCL 4 MG PO TABS: 4.0000 mg | ORAL_TABLET | Freq: Four times a day (QID) | ORAL | Status: DC | PRN

## 2022-06-22 MED ORDER — FENTANYL CITRATE (PF) 250 MCG/5ML IJ SOLN
INTRAMUSCULAR | Status: DC | PRN
  Administered 2022-06-22 (×5): 50 ug via INTRAVENOUS

## 2022-06-22 MED ORDER — 0.9 % SODIUM CHLORIDE (POUR BTL) OPTIME
TOPICAL | Status: DC | PRN
  Administered 2022-06-22: 2000 mL

## 2022-06-22 MED ORDER — SODIUM CHLORIDE 0.9 % IV SOLN
2.0000 g | INTRAVENOUS | Status: AC
  Administered 2022-06-22: 2 g via INTRAVENOUS
  Filled 2022-06-22: qty 2

## 2022-06-22 MED ORDER — BUPIVACAINE LIPOSOME 1.3 % IJ SUSP
INTRAMUSCULAR | Status: AC
  Filled 2022-06-22: qty 20

## 2022-06-22 MED ORDER — BUPIVACAINE LIPOSOME 1.3 % IJ SUSP
INTRAMUSCULAR | Status: DC | PRN
  Administered 2022-06-22: 20 mL

## 2022-06-22 MED ORDER — LACTATED RINGERS IV SOLN: INTRAVENOUS | Status: DC | PRN

## 2022-06-22 MED ORDER — ONDANSETRON HCL 4 MG/2ML IJ SOLN
INTRAMUSCULAR | Status: AC
  Filled 2022-06-22: qty 2

## 2022-06-22 MED ORDER — MIDAZOLAM HCL 2 MG/2ML IJ SOLN
INTRAMUSCULAR | Status: AC
  Filled 2022-06-22: qty 2

## 2022-06-22 MED ORDER — SODIUM CHLORIDE (PF) 0.9 % IJ SOLN
INTRAMUSCULAR | Status: AC
  Filled 2022-06-22: qty 20

## 2022-06-22 MED ORDER — SIMETHICONE 80 MG PO CHEW
80.0000 mg | CHEWABLE_TABLET | Freq: Four times a day (QID) | ORAL | Status: DC | PRN
  Administered 2022-06-22 – 2022-06-23 (×2): 80 mg via ORAL
  Filled 2022-06-22 (×2): qty 1

## 2022-06-22 MED ORDER — ORAL CARE MOUTH RINSE: 15.0000 mL | Freq: Once | OROMUCOSAL | Status: AC

## 2022-06-22 MED ORDER — SCOPOLAMINE 1 MG/3DAYS TD PT72
1.0000 | MEDICATED_PATCH | TRANSDERMAL | Status: DC
  Administered 2022-06-22: 1.5 mg via TRANSDERMAL
  Filled 2022-06-22: qty 1

## 2022-06-22 MED ORDER — ACETAMINOPHEN 500 MG PO TABS
1000.0000 mg | ORAL_TABLET | Freq: Four times a day (QID) | ORAL | Status: DC
  Administered 2022-06-22 – 2022-06-24 (×8): 1000 mg via ORAL
  Filled 2022-06-22 (×9): qty 2

## 2022-06-22 MED ORDER — OXYCODONE HCL 5 MG PO TABS
5.0000 mg | ORAL_TABLET | ORAL | Status: DC | PRN
  Administered 2022-06-22: 5 mg via ORAL
  Filled 2022-06-22: qty 1

## 2022-06-22 MED ORDER — LIDOCAINE HCL (PF) 2 % IJ SOLN
INTRAMUSCULAR | Status: AC
  Filled 2022-06-22: qty 5

## 2022-06-22 MED ORDER — HEPARIN SODIUM (PORCINE) 5000 UNIT/ML IJ SOLN
5000.0000 [IU] | INTRAMUSCULAR | Status: AC
  Administered 2022-06-22: 5000 [IU] via SUBCUTANEOUS
  Filled 2022-06-22: qty 1

## 2022-06-22 MED ORDER — IBUPROFEN 400 MG PO TABS
600.0000 mg | ORAL_TABLET | Freq: Four times a day (QID) | ORAL | Status: DC
  Administered 2022-06-23 – 2022-06-24 (×6): 600 mg via ORAL
  Filled 2022-06-22 (×6): qty 1

## 2022-06-22 MED ORDER — LIDOCAINE 2% (20 MG/ML) 5 ML SYRINGE
INTRAMUSCULAR | Status: DC | PRN
  Administered 2022-06-22: 40 mg via INTRAVENOUS

## 2022-06-22 MED ORDER — OXYCODONE HCL 5 MG PO TABS: 5.0000 mg | ORAL_TABLET | Freq: Once | ORAL | Status: DC | PRN

## 2022-06-22 MED ORDER — PROPOFOL 10 MG/ML IV BOLUS
INTRAVENOUS | Status: DC | PRN
  Administered 2022-06-22: 120 mg via INTRAVENOUS

## 2022-06-22 MED ORDER — POVIDONE-IODINE 10 % EX SWAB: 2.0000 | Freq: Once | CUTANEOUS | Status: DC

## 2022-06-22 MED ORDER — DEXMEDETOMIDINE HCL IN NACL 80 MCG/20ML IV SOLN
INTRAVENOUS | Status: DC | PRN
  Administered 2022-06-22: 12 ug via INTRAVENOUS

## 2022-06-22 MED ORDER — OXYCODONE HCL 5 MG/5ML PO SOLN: 5.0000 mg | Freq: Once | ORAL | Status: DC | PRN

## 2022-06-22 MED ORDER — DEXAMETHASONE SODIUM PHOSPHATE 10 MG/ML IJ SOLN
INTRAMUSCULAR | Status: DC | PRN
  Administered 2022-06-22: 10 mg via INTRAVENOUS

## 2022-06-22 MED ORDER — FENTANYL CITRATE PF 50 MCG/ML IJ SOSY
PREFILLED_SYRINGE | INTRAMUSCULAR | Status: AC
  Filled 2022-06-22: qty 1

## 2022-06-22 MED ORDER — HYDROMORPHONE HCL 1 MG/ML IJ SOLN: 0.5000 mg | INTRAMUSCULAR | Status: DC | PRN

## 2022-06-22 MED ORDER — PROPOFOL 1000 MG/100ML IV EMUL
INTRAVENOUS | Status: AC
  Filled 2022-06-22: qty 100

## 2022-06-22 MED ORDER — LACTATED RINGERS IV SOLN: INTRAVENOUS | Status: DC

## 2022-06-22 MED ORDER — PROPOFOL 10 MG/ML IV BOLUS
INTRAVENOUS | Status: AC
  Filled 2022-06-22: qty 20

## 2022-06-22 MED ORDER — AMISULPRIDE (ANTIEMETIC) 5 MG/2ML IV SOLN: 10.0000 mg | Freq: Once | INTRAVENOUS | Status: DC | PRN

## 2022-06-22 MED ORDER — KETOROLAC TROMETHAMINE 15 MG/ML IJ SOLN: 15.0000 mg | INTRAMUSCULAR | Status: DC

## 2022-06-22 MED ORDER — FENTANYL CITRATE (PF) 250 MCG/5ML IJ SOLN
INTRAMUSCULAR | Status: AC
  Filled 2022-06-22: qty 5

## 2022-06-22 MED ORDER — TRAMADOL HCL 50 MG PO TABS
100.0000 mg | ORAL_TABLET | Freq: Four times a day (QID) | ORAL | Status: DC | PRN
  Administered 2022-06-22 – 2022-06-23 (×3): 100 mg via ORAL
  Administered 2022-06-24: 50 mg via ORAL
  Filled 2022-06-22 (×4): qty 2

## 2022-06-22 MED ORDER — CHLORHEXIDINE GLUCONATE 0.12 % MT SOLN
15.0000 mL | Freq: Once | OROMUCOSAL | Status: AC
  Administered 2022-06-22: 15 mL via OROMUCOSAL

## 2022-06-22 MED ORDER — ONDANSETRON HCL 4 MG/2ML IJ SOLN
INTRAMUSCULAR | Status: DC | PRN
  Administered 2022-06-22: 4 mg via INTRAVENOUS

## 2022-06-22 MED ORDER — ACETAMINOPHEN 500 MG PO TABS
1000.0000 mg | ORAL_TABLET | ORAL | Status: AC
  Administered 2022-06-22: 1000 mg via ORAL
  Filled 2022-06-22: qty 2

## 2022-06-22 SURGICAL SUPPLY — 74 items
ADH SKN CLS APL DERMABOND .7 (GAUZE/BANDAGES/DRESSINGS)
AGENT HMST KT MTR STRL THRMB (HEMOSTASIS)
APL PRP STRL LF DISP 70% ISPRP (MISCELLANEOUS) ×1
BAG COUNTER SPONGE SURGICOUNT (BAG) IMPLANT
BAG SPNG CNTER NS LX DISP (BAG)
BLADE EXTENDED COATED 6.5IN (ELECTRODE) ×1 IMPLANT
CELLS DAT CNTRL 66122 CELL SVR (MISCELLANEOUS) IMPLANT
CHLORAPREP W/TINT 26 (MISCELLANEOUS) ×1 IMPLANT
CLIP TI LARGE 6 (CLIP) ×1 IMPLANT
CLIP TI MEDIUM 6 (CLIP) ×1 IMPLANT
CLIP TI MEDIUM LARGE 6 (CLIP) ×1 IMPLANT
CNTNR URN SCR LID CUP LEK RST (MISCELLANEOUS) IMPLANT
CONT SPEC 4OZ STRL OR WHT (MISCELLANEOUS) ×1
DERMABOND ADVANCED .7 DNX12 (GAUZE/BANDAGES/DRESSINGS) IMPLANT
DRAPE INCISE IOBAN 66X45 STRL (DRAPES) IMPLANT
DRAPE SURG IRRIG POUCH 19X23 (DRAPES) ×1 IMPLANT
DRAPE WARM FLUID 44X44 (DRAPES) ×1 IMPLANT
DRSG OPSITE POSTOP 4X10 (GAUZE/BANDAGES/DRESSINGS) IMPLANT
DRSG OPSITE POSTOP 4X6 (GAUZE/BANDAGES/DRESSINGS) IMPLANT
DRSG OPSITE POSTOP 4X8 (GAUZE/BANDAGES/DRESSINGS) IMPLANT
DRSG TEGADERM 4X4.75 (GAUZE/BANDAGES/DRESSINGS) IMPLANT
ELECT REM PT RETURN 15FT ADLT (MISCELLANEOUS) ×1 IMPLANT
GAUZE 4X4 16PLY ~~LOC~~+RFID DBL (SPONGE) IMPLANT
GAUZE SPONGE 4X4 12PLY STRL (GAUZE/BANDAGES/DRESSINGS) IMPLANT
GLOVE BIO SURGEON STRL SZ 6 (GLOVE) ×2 IMPLANT
GLOVE BIO SURGEON STRL SZ 6.5 (GLOVE) ×1 IMPLANT
GOWN STRL REUS W/ TWL LRG LVL3 (GOWN DISPOSABLE) ×2 IMPLANT
GOWN STRL REUS W/TWL LRG LVL3 (GOWN DISPOSABLE) ×2
HEMOSTAT ARISTA ABSORB 3G PWDR (HEMOSTASIS) IMPLANT
KIT BASIN OR (CUSTOM PROCEDURE TRAY) ×1 IMPLANT
KIT TURNOVER KIT A (KITS) IMPLANT
LIGASURE IMPACT 36 18CM CVD LR (INSTRUMENTS) IMPLANT
LOOP VESSEL MAXI BLUE (MISCELLANEOUS) IMPLANT
NDL HYPO 21X1.5 SAFETY (NEEDLE) ×2 IMPLANT
NEEDLE HYPO 21X1.5 SAFETY (NEEDLE) ×1 IMPLANT
NS IRRIG 1000ML POUR BTL (IV SOLUTION) ×2 IMPLANT
PACK GENERAL/GYN (CUSTOM PROCEDURE TRAY) ×1 IMPLANT
PACKING GAUZE IODOFORM 1INX5YD (GAUZE/BANDAGES/DRESSINGS) IMPLANT
RELOAD PROXIMATE 75MM BLUE (ENDOMECHANICALS) ×1 IMPLANT
RELOAD PROXIMATE TA60MM BLUE (ENDOMECHANICALS) IMPLANT
RELOAD STAPLE 60 BLU REG PROX (ENDOMECHANICALS) IMPLANT
RELOAD STAPLE 75 3.8 BLU REG (ENDOMECHANICALS) IMPLANT
RETRACTOR WND ALEXIS 18 MED (MISCELLANEOUS) IMPLANT
RETRACTOR WND ALEXIS 25 LRG (MISCELLANEOUS) IMPLANT
RTRCTR WOUND ALEXIS 18CM MED (MISCELLANEOUS)
RTRCTR WOUND ALEXIS 25CM LRG (MISCELLANEOUS)
SHEET LAVH (DRAPES) ×1 IMPLANT
SLEEVE SUCTION CATH 165 (SLEEVE) ×1 IMPLANT
SOL PREP POV-IOD 4OZ 10% (MISCELLANEOUS) ×1 IMPLANT
STAPLER GUN LINEAR PROX 60 (STAPLE) IMPLANT
STAPLER PROXIMATE 75MM BLUE (STAPLE) IMPLANT
STAPLER VISISTAT 35W (STAPLE) IMPLANT
SURGIFLO W/THROMBIN 8M KIT (HEMOSTASIS) IMPLANT
SUT MNCRL AB 4-0 PS2 18 (SUTURE) ×2 IMPLANT
SUT PDS AB 1 CTX 36 (SUTURE) IMPLANT
SUT PDS AB 1 TP1 96 (SUTURE) ×2 IMPLANT
SUT SILK 3 0 SH CR/8 (SUTURE) IMPLANT
SUT VIC AB 0 CT1 36 (SUTURE) ×4 IMPLANT
SUT VIC AB 2-0 CT1 27 (SUTURE) ×2
SUT VIC AB 2-0 CT1 36 (SUTURE) ×2 IMPLANT
SUT VIC AB 2-0 CT1 TAPERPNT 27 (SUTURE) ×2 IMPLANT
SUT VIC AB 2-0 CT2 27 (SUTURE) ×6 IMPLANT
SUT VIC AB 2-0 SH 27 (SUTURE) ×8
SUT VIC AB 2-0 SH 27X BRD (SUTURE) IMPLANT
SUT VIC AB 2-0 SH 27XBRD (SUTURE) IMPLANT
SUT VIC AB 3-0 CTX 36 (SUTURE) IMPLANT
SUT VIC AB 3-0 SH 18 (SUTURE) IMPLANT
SUT VIC AB 3-0 SH 27 (SUTURE) ×1
SUT VIC AB 3-0 SH 27X BRD (SUTURE) ×1 IMPLANT
SYR 30ML LL (SYRINGE) ×2 IMPLANT
TOWEL OR 17X26 10 PK STRL BLUE (TOWEL DISPOSABLE) ×1 IMPLANT
TOWEL OR NON WOVEN STRL DISP B (DISPOSABLE) ×1 IMPLANT
TRAY FOLEY MTR SLVR 16FR STAT (SET/KITS/TRAYS/PACK) ×1 IMPLANT
UNDERPAD 30X36 HEAVY ABSORB (UNDERPADS AND DIAPERS) ×1 IMPLANT

## 2022-06-22 NOTE — Brief Op Note (Signed)
06/22/2022  4:39 PM  PATIENT:  [name]  52 y.o. female  PRE-OPERATIVE DIAGNOSIS:  ILEOSTOMY, HISTORY OF OVARIAN CANCER  POST-OPERATIVE DIAGNOSIS:  ILEOSTOMY, HISTORY OF OVARIAN CANCER  PROCEDURE:  Procedure(s): ILEOSTOMY TAKEDOWN (N/A)  SURGEON:  Surgeon(s) and Role:    * Carver Fila, MD - Primary    * Antionette Char, MD - Assisting  ANESTHESIA:   general  EBL:  100 mL   BLOOD ADMINISTERED:none  DRAINS: none   LOCAL MEDICATIONS USED:  Exparel     SPECIMEN:  Proximal and distal ends of the ileostomy  DISPOSITION OF SPECIMEN:  PATHOLOGY  COUNTS:  YES  TOURNIQUET:  * No tourniquets in log *  DICTATION: .Note written in EPIC  PLAN OF CARE: Admit to inpatient   PATIENT DISPOSITION:  PACU - hemodynamically stable.   Delay start of Pharmacological VTE agent (>24hrs) due to surgical blood loss or risk of bleeding: no

## 2022-06-22 NOTE — Discharge Instructions (Addendum)
AFTER SURGERY INSTRUCTIONS   Return to work: 4-6 weeks if applicable  You have been prescribed 2 weeks of DVT prophylactic Eliquis. DO NOT START THIS UNTIL MONDAY, MAY 13.  YOU WILL TAKE ELIQUIS TWICE DAILY. THIS IS A BLOOD THINNER TO PREVENT BLOOD CLOTS SO YOU WILL NEED TO MONITOR FOR BLEEDING AND CALL THE OFFICE.  MONITOR THE COLOR OF YOUR STOOLS AND CALL FOR BRIGHT RED BLEEDING, DARK STOOLS  AVOID USE OF NSAIDS (IBUPROFEN, NAPROXEN) WHILE TAKING THE BLOOD THINNER.   Activity: 1. Be up and out of the bed during the day.  Take a nap if needed.  You may walk up steps but be careful and use the hand rail.  Stair climbing will tire you more than you think, you may need to stop part way and rest.    2. No lifting or straining for 6 weeks over 10 pounds. No pushing, pulling, straining for 6 weeks.   3. No driving for around 1 week(s).  Do not drive if you are taking narcotic pain medicine and make sure that your reaction time has returned.    4. You can shower as soon as the next day after surgery. Shower daily.  Use your regular soap and water (not directly on the incision) and pat your incision(s) dry afterwards; don't rub.  No tub baths or submerging your body in water until cleared by your surgeon. If you have the soap that was given to you by pre-surgical testing that was used before surgery, you do not need to use it afterwards because this can irritate your incisions.    5. You may experience a small amount of clear drainage from your incision, which is normal.  If the drainage persists, increases, or changes color please call the office.   6. Do not use creams, lotions, or ointments such as neosporin on your incision after surgery until advised by your surgeon.   7. Take Tylenol or ibuprofen first for pain if you are able to take these medications and only use narcotic pain medication for severe pain not relieved by the Tylenol or Ibuprofen.  Monitor your Tylenol intake to a max of  4,000 mg in a 24 hour period. You can alternate these medications after surgery.   Diet: 1. Low sodium Heart Healthy Diet is recommended but you are cleared to resume your normal (before surgery) diet after your procedure.   Wound Care: 1. Keep clean and dry.  Shower daily.   Reasons to call the Doctor: Fever - Oral temperature greater than 100.4 degrees Fahrenheit Foul-smelling vaginal discharge Difficulty urinating Nausea and vomiting Increased pain at the site of the incision that is unrelieved with pain medicine. Difficulty breathing with or without chest pain New calf pain especially if only on one side Sudden, continuing increased vaginal bleeding with or without clots.   Contacts: For questions or concerns you should contact:   Dr. Eugene Garnet at [phone]   [name], NP at [phone]   After Hours: call [phone] and have the GYN Oncologist paged/contacted (after 5 pm or on the weekends). You will speak with an after hours RN and let he or she know you have had surgery.   Messages sent via mychart are for non-urgent matters and are not responded to after hours so for urgent needs, please call the after hours number.

## 2022-06-22 NOTE — Interval H&P Note (Signed)
History and Physical Interval Note:  06/22/2022 1:59 PM  [name]  has presented today for surgery, with the diagnosis of ILEOSTOMY, HYSTORY OF OVARIAN CANCER.  The various methods of treatment have been discussed with the patient and family. After consideration of risks, benefits and other options for treatment, the patient has consented to  Procedure(s): ILEOSTOMY TAKEDOWN (N/A) as a surgical intervention.  The patient's history has been reviewed, patient examined, no change in status, stable for surgery.  I have reviewed the patient's chart and labs.  Questions were answered to the patient's satisfaction.     Carver Fila

## 2022-06-22 NOTE — Transfer of Care (Signed)
Immediate Anesthesia Transfer of Care Note  Patient: [name]  Procedure(s) Performed: ILEOSTOMY TAKEDOWN  Patient Location: PACU  Anesthesia Type:General  Level of Consciousness: awake and alert   Airway & Oxygen Therapy: Patient Spontanous Breathing and Patient connected to nasal cannula oxygen  Post-op Assessment: Report given to RN and Post -op Vital signs reviewed and stable  Post vital signs: Reviewed and stable  Last Vitals:  Vitals Value Taken Time  BP 122/71 06/22/22 1646  Temp    Pulse 91 06/22/22 1648  Resp 22 06/22/22 1648  SpO2 96 % 06/22/22 1648  Vitals shown include unvalidated device data.  Last Pain:  Vitals:   06/22/22 1256  TempSrc:   PainSc: 0-No pain         Complications: No notable events documented.

## 2022-06-22 NOTE — Anesthesia Procedure Notes (Signed)
Procedure Name: Intubation Date/Time: 06/22/2022 3:03 PM  Performed by: Lovie Chol, CRNAPre-anesthesia Checklist: Patient identified, Emergency Drugs available, Suction available and Patient being monitored Patient Re-evaluated:Patient Re-evaluated prior to induction Oxygen Delivery Method: Circle System Utilized Preoxygenation: Pre-oxygenation with 100% oxygen Induction Type: IV induction Ventilation: Mask ventilation without difficulty Laryngoscope Size: Miller and 2 Grade View: Grade II Tube type: Oral Tube size: 7.0 mm Number of attempts: 1 Airway Equipment and Method: Stylet Placement Confirmation: ETT inserted through vocal cords under direct vision, positive ETCO2 and breath sounds checked- equal and bilateral Secured at: 20 cm Tube secured with: Tape Dental Injury: Teeth and Oropharynx as per pre-operative assessment

## 2022-06-22 NOTE — Anesthesia Postprocedure Evaluation (Signed)
Anesthesia Post Note  Patient: [name]  Procedure(s) Performed: ILEOSTOMY TAKEDOWN     Patient location during evaluation: PACU Anesthesia Type: General Level of consciousness: awake and alert Pain management: pain level controlled Vital Signs Assessment: post-procedure vital signs reviewed and stable Respiratory status: spontaneous breathing, nonlabored ventilation and respiratory function stable Cardiovascular status: stable and blood pressure returned to baseline Anesthetic complications: no   No notable events documented.  Last Vitals:  Vitals:   06/22/22 1646 06/22/22 1700  BP: 122/71 125/71  Pulse: 83 79  Resp: 14 (!) 8  Temp: 36.6 C   SpO2: 95% 99%    Last Pain:  Vitals:   06/22/22 1703  TempSrc:   PainSc: Asleep                 Beryle Lathe

## 2022-06-22 NOTE — Anesthesia Preprocedure Evaluation (Addendum)
Anesthesia Evaluation  Patient identified by MRN, date of birth, ID band Patient awake    Reviewed: Allergy & Precautions, NPO status , Patient's Chart, lab work & pertinent test results  History of Anesthesia Complications (+) PONV and history of anesthetic complications  Airway Mallampati: II  TM Distance: >3 FB Neck ROM: Full    Dental  (+) Dental Advisory Given, Teeth Intact   Pulmonary neg pulmonary ROS   Pulmonary exam normal        Cardiovascular negative cardio ROS Normal cardiovascular exam     Neuro/Psych  PSYCHIATRIC DISORDERS Anxiety     negative neurological ROS     GI/Hepatic Neg liver ROS,GERD  Medicated and Controlled,,  Endo/Other  diabetes, Gestational    Renal/GU negative Renal ROS     Musculoskeletal  (+) Arthritis ,    Abdominal   Peds  Hematology negative hematology ROS (+)   Anesthesia Other Findings   Reproductive/Obstetrics  Ovarian cancer s/p salpingectomy s/p tubal ligation                                Anesthesia Physical Anesthesia Plan  ASA: 2  Anesthesia Plan: General   Post-op Pain Management: Tylenol PO (pre-op)* and Toradol IV (intra-op)*   Induction: Intravenous  PONV Risk Score and Plan: 4 or greater and Treatment may vary due to age or medical condition, Ondansetron, Midazolam, TIVA and Scopolamine patch - Pre-op  Airway Management Planned: Oral ETT  Additional Equipment: None  Intra-op Plan:   Post-operative Plan: Extubation in OR  Informed Consent: I have reviewed the patients History and Physical, chart, labs and discussed the procedure including the risks, benefits and alternatives for the proposed anesthesia with the patient or authorized representative who has indicated his/her understanding and acceptance.     Dental advisory given  Plan Discussed with: CRNA and Anesthesiologist  Anesthesia Plan Comments:           Anesthesia Quick Evaluation

## 2022-06-22 NOTE — Op Note (Signed)
OPERATIVE NOTE  Pre-operative Diagnosis: Ileostomy, planned reversal  Post-operative Diagnosis: same as above  Operation: Ileostomy takedown  Surgeon: Eugene Garnet MD  Assistant Surgeon: Antionette Char MD (an MD assistant was necessary for tissue manipulation, management of robotic instrumentation, retraction and positioning due to the complexity of the case and hospital policies).   Anesthesia: GET  Urine Output: 50 cc  Operative Findings: Normal-appearing loop ileostomy with some mild retraction.  Some filmy adhesions around the loop of ileum itself and its mesentery.  Fascia easily identified and normal in appearance.  Ileum freed from surrounding anterior abdominal wall peritoneum.  Estimated Blood Loss:  100 cc      Total IV Fluids: see I&O flowsheet         Specimens: proximal and distal ends of the ileostomy         Complications:  None apparent; patient tolerated the procedure well.         Disposition: PACU - hemodynamically stable.  Procedure Details  The patient was seen in the Holding Room. The risks, benefits, complications, treatment options, and expected outcomes were discussed with the patient.  The patient concurred with the proposed plan, giving informed consent.  The site of surgery properly noted/marked. The patient was identified as [name] and the procedure verified as an ileostomy reversal.   After induction of anesthesia, the urethra with prepped with Betadine and a Foley catheter was placed.  2 interrupted sutures were used to close the afferent and efferent ends of the ileostomy.  The patient was draped and prepped in the usual sterile manner.  The patient's abdomen was prepped with ChloraPrep and then she was draped after the prep had been allowed to dry for 3 minutes.  The stoma itself was cleansed with Betadine.  A Time Out was held and the above information confirmed.  A circular incision was made using the scalpel followed by Bovie  monopolar electrocautery around the skin of the ostomy site.  Next, the subcutaneous tissue was carefully dissected down to the bowel into the underlying fascia.  This was done with a combination of electrocautery, Metzenbaum scissors, and blunt dissection.  Once to the interface of the bowel and the fascia, scalpel was used to incise the anterior fascia.  The interface between the bowel and the peritoneum was then easily identified and entered sharply.  Remaining filmy attachments between the bowel and the fascia were undermined with the finger and taken down with the Bovie until the bowel and mesentery were completely freed.  The ostomy was gently elevated upwards.  Using a hemostat, a defect was created in the mesentery of the ileum below the level of the ileostomy.  2 large bowel clamps were then placed under the stoma across the ileum.  GIA staplers were then used to staple and transect the ileum, removing the prior ileostomy.  This was passed off the field for permanent pathology.  The suture lines were then excised and the open lumens were suspended upwards with Allis clamps.  2 stay sutures using 2-0 Vicryl were used to approximate antimesenteric surfaces of the ileum to each other.  A GIA staple load was then used to create a side-to-side anastomosis between the distal and proximal ends of the ileum.  Care was taken to ensure that the bowel walls were oriented antimesenteric to antimesenteric before deployment staple load.  Next, the TA stapler was brought onto the field and the staple load was fired across the top of the anastomosis.  Metzenbaum  scissors were used to remove the small excess tissue above the stapler.  The lumen of the side-to-side anastomosis was sufficient with approximately 3-4 cm width.  Small bleeders of the mesentery were controlled with monopolar electrocautery.  The small defect in the mesentery was closed with interrupted sutures of 2-0 Vicryl and the staple line itself was  oversewn with interrupted 2-0 Vicryl.  Meticulous hemostasis was noted.  The bowel was then gently replaced into the abdominal cavity and noted to be free of any tension.  The peritoneum was then approximated with 2 interrupted sutures of 2-0 Vicryl.  The fascia was closed with #1 non-looped PDS suture in a running fashion.  The subcutaneous tissue was irrigated copiously and tissue made hemostatic.  Exparel was injected for local anesthesia.  Next, 2-0 Vicryl was used to reapproximate the subcutaneous tissue in pursestring fashion.  This was done in 3 layers.  Iodoform packing gauze and a 4 x 4 were then used to cover the opened incision followed by a Tegaderm.  Foley catheter was removed.  All sponge, lap and needle counts were correct x  3.   The patient was transferred to the recovery room in stable condition.  Eugene Garnet, MD

## 2022-06-23 ENCOUNTER — Encounter (HOSPITAL_COMMUNITY): Payer: Self-pay | Admitting: Gynecologic Oncology

## 2022-06-23 LAB — BASIC METABOLIC PANEL
Anion gap: 5 (ref 5–15)
BUN: 11 mg/dL (ref 6–20)
CO2: 24 mmol/L (ref 22–32)
Calcium: 8.7 mg/dL — ABNORMAL LOW (ref 8.9–10.3)
Chloride: 105 mmol/L (ref 98–111)
Creatinine, Ser: 0.73 mg/dL (ref 0.44–1.00)
GFR, Estimated: >60 mL/min (ref >60)
Glucose, Bld: 145 mg/dL — ABNORMAL HIGH (ref 70–99)
Potassium: 4.3 mmol/L (ref 3.5–5.1)
Sodium: 134 mmol/L — ABNORMAL LOW (ref 135–145)

## 2022-06-23 LAB — CBC
HCT: 30.1 % — ABNORMAL LOW (ref 36.0–46.0)
Hemoglobin: 9.9 g/dL — ABNORMAL LOW (ref 12.0–15.0)
MCH: 33.8 pg (ref 26.0–34.0)
MCHC: 32.9 g/dL (ref 30.0–36.0)
MCV: 102.7 fL — ABNORMAL HIGH (ref 80.0–100.0)
Platelets: 338 10*3/uL (ref 150–400)
RBC: 2.93 MIL/uL — ABNORMAL LOW (ref 3.87–5.11)
RDW: 14.2 % (ref 11.5–15.5)
WBC: 20.2 10*3/uL — ABNORMAL HIGH (ref 4.0–10.5)
nRBC: 0 % (ref 0.0–0.2)

## 2022-06-23 MED ORDER — DIPHENHYDRAMINE HCL 25 MG PO CAPS
25.0000 mg | ORAL_CAPSULE | Freq: Four times a day (QID) | ORAL | Status: DC | PRN
  Administered 2022-06-23: 25 mg via ORAL
  Filled 2022-06-23: qty 1

## 2022-06-23 MED ORDER — CHLORHEXIDINE GLUCONATE CLOTH 2 % EX PADS
6.0000 | MEDICATED_PAD | Freq: Every day | CUTANEOUS | Status: DC
  Administered 2022-06-23 – 2022-06-24 (×2): 6 via TOPICAL

## 2022-06-23 MED ORDER — SODIUM CHLORIDE 0.9 % IV SOLN: INTRAVENOUS | Status: DC

## 2022-06-23 NOTE — Progress Notes (Signed)
GYN Oncology Progress Note  Patient reports doing well.  No nausea or emesis reported.  She is tolerating a soft diet.  She has been ambulating in the halls frequently.  Voiding without difficulty.  Denies flatus or having a bowel movement but feels her abdomen rumbling.  No needs or concerns voiced at this time. Continue plan of care.

## 2022-06-23 NOTE — TOC CM/SW Note (Signed)
Transition of Care [facility]) Screening Note  Patient Details  Name: [name] Date of Birth: 07/12/1970  Transition of Care [facility]) CM/SW Contact:    Ewing Schlein, LCSW Phone Number: 06/23/2022, 8:57 AM  Transition of Care Department [facility]) has reviewed patient and no TOC needs have been identified at this time. We will continue to monitor patient advancement through interdisciplinary progression rounds. If new patient transition needs arise, please place a TOC consult.

## 2022-06-23 NOTE — Progress Notes (Signed)
1 Day Post-Op Procedure(s) (LRB): ILEOSTOMY TAKEDOWN (N/A)  Subjective: Patient reports doing well this am. Nausea right out of surgery but none since. Ambulating in the halls. Pain is manageable. No emesis or nausea this am. No flatus. Feels abdomen rumbling. No concerns voiced.    Objective: Vital signs in last 24 hours: Temp:  [97.5 F (36.4 C)-98.3 F (36.8 C)] 98.1 F (36.7 C) (05/09 0526) Pulse Rate:  [76-119] 76 (05/09 0526) Resp:  [8-20] 16 (05/09 0526) BP: (101-128)/(69-83) 106/71 (05/09 0526) SpO2:  [95 %-100 %] 100 % (05/09 0526) Weight:  [107 lb (48.5 kg)] 107 lb (48.5 kg) (05/08 1743) Last BM Date :  (Last BM when she had ostomy)  Intake/Output from previous day: 05/08 0701 - 05/09 0700 In: 1735 [P.O.:120; I.V.:1515; IV Piggyback:100] Out: 950 [Urine:850; Blood:100]  Physical Examination: General: alert, cooperative, and no distress Resp: clear to auscultation bilaterally Cardio: regular rate and rhythm, S1, S2 normal, no murmur, click, rub or gallop GI: incision: large abdominal dressing dry and intact over open incision and abdomen soft, active bowel sounds, non-distended Extremities: extremities normal, atraumatic, no cyanosis or edema SCDs on  Labs: WBC/Hgb/Hct/Plts:  20.2/9.9/30.1/338 (05/09 0510) BUN/Cr/glu/ALT/AST/amyl/lip:  11/0.73/--/--/--/--/-- (05/09 0510)  Assessment: 52 y.o. s/p Procedure(s): ILEOSTOMY TAKEDOWN: stable Pain:  Pain is well-controlled on PRN medications.  Heme: Hgb 9.9 and Hct 30.1 this am-appropriate given preop values.  ID: WBC 20.2 this am. Given decadron intra-op along with IV abx.  CV: BP and HR stable. Continue to monitor with ordered vital signs.  GI:  Tolerating po: on full liquids. S/P ileostomy takedown.  GU: Creatinine 0.73 this am. 850 cc of urine output documented.   FEN: No critical values on am labs.  Prophylaxis: SCDs and lovenox ordered.  Plan: Advance to soft diet later today if still doing well, no  nausea Encourage increasing mobility Dressing change to include iodoform packing strip lightly placed in incisional opening with one 4x4 dressing on top.  Will plan for 2 weeks of prophylactic eliquis at discharge Continue plan of care per Dr. Pricilla Holm   LOS: 1 day    Natassia Guthridge D Kavion Mancinas 06/23/2022, 7:12 AM

## 2022-06-24 ENCOUNTER — Other Ambulatory Visit (HOSPITAL_COMMUNITY): Payer: Self-pay

## 2022-06-24 ENCOUNTER — Telehealth: Payer: Self-pay | Admitting: Oncology

## 2022-06-24 ENCOUNTER — Telehealth: Payer: Self-pay

## 2022-06-24 LAB — CBC WITH DIFFERENTIAL/PLATELET
Abs Immature Granulocytes: 0.17 10*3/uL — ABNORMAL HIGH (ref 0.00–0.07)
Basophils Absolute: 0.1 10*3/uL (ref 0.0–0.1)
Basophils Relative: 0 %
Eosinophils Absolute: 0.4 10*3/uL (ref 0.0–0.5)
Eosinophils Relative: 3 %
HCT: 29 % — ABNORMAL LOW (ref 36.0–46.0)
Hemoglobin: 9.4 g/dL — ABNORMAL LOW (ref 12.0–15.0)
Immature Granulocytes: 1 %
Lymphocytes Relative: 20 %
Lymphs Abs: 2.7 10*3/uL (ref 0.7–4.0)
MCH: 33.8 pg (ref 26.0–34.0)
MCHC: 32.4 g/dL (ref 30.0–36.0)
MCV: 104.3 fL — ABNORMAL HIGH (ref 80.0–100.0)
Monocytes Absolute: 1.7 10*3/uL — ABNORMAL HIGH (ref 0.1–1.0)
Monocytes Relative: 13 %
Neutro Abs: 8.4 10*3/uL — ABNORMAL HIGH (ref 1.7–7.7)
Neutrophils Relative %: 63 %
Platelets: 318 10*3/uL (ref 150–400)
RBC: 2.78 MIL/uL — ABNORMAL LOW (ref 3.87–5.11)
RDW: 14.5 % (ref 11.5–15.5)
WBC: 13.5 10*3/uL — ABNORMAL HIGH (ref 4.0–10.5)
nRBC: 0 % (ref 0.0–0.2)

## 2022-06-24 LAB — BASIC METABOLIC PANEL
Anion gap: 7 (ref 5–15)
BUN: 11 mg/dL (ref 6–20)
CO2: 27 mmol/L (ref 22–32)
Calcium: 8.7 mg/dL — ABNORMAL LOW (ref 8.9–10.3)
Chloride: 107 mmol/L (ref 98–111)
Creatinine, Ser: 0.76 mg/dL (ref 0.44–1.00)
GFR, Estimated: >60 mL/min (ref >60)
Glucose, Bld: 97 mg/dL (ref 70–99)
Potassium: 3.9 mmol/L (ref 3.5–5.1)
Sodium: 141 mmol/L (ref 135–145)

## 2022-06-24 LAB — SURGICAL PATHOLOGY

## 2022-06-24 MED ORDER — HEPARIN SOD (PORK) LOCK FLUSH 100 UNIT/ML IV SOLN
500.0000 [IU] | INTRAVENOUS | Status: AC | PRN
  Administered 2022-06-24: 500 [IU]
  Filled 2022-06-24: qty 5

## 2022-06-24 MED ORDER — APIXABAN 2.5 MG PO TABS: 2.5000 mg | ORAL_TABLET | Freq: Two times a day (BID) | ORAL | 0 refills | Status: DC

## 2022-06-24 MED ORDER — LORAZEPAM 0.5 MG PO TABS
0.5000 mg | ORAL_TABLET | Freq: Three times a day (TID) | ORAL | 0 refills | Status: DC | PRN
  Filled 2022-06-24: qty 30, 10d supply, fill #0

## 2022-06-24 MED ORDER — APIXABAN 2.5 MG PO TABS
2.5000 mg | ORAL_TABLET | Freq: Two times a day (BID) | ORAL | 0 refills | Status: DC
  Filled 2022-06-24: qty 28, 14d supply, fill #0

## 2022-06-24 NOTE — Telephone Encounter (Signed)
Per message from [name], NP asked to call patient and setup appointment for nurse evaluation to have wound checked.  Called patient who is currently admitted in the hospital and gave her appointment on 06/30/22 at 1:00pm.. Patient confirmed and understood.

## 2022-06-24 NOTE — Discharge Summary (Signed)
Physician Discharge Summary  Patient ID: [name] MRN: [id number] DOB/AGE: 51-Aug-1972 50 y.o.  Admit date: 06/22/2022 Discharge date: 06/24/2022  Admission Diagnoses: Ileostomy present [facility])  Discharge Diagnoses:  Principal Problem:   Ileostomy present [facility]) Active Problems:   Ileostomy in place [facility])   Gynecologic malignancy [facility])   Discharged Condition:  The patient is in good condition and stable for discharge.    Hospital Course: On 06/22/2022, the patient underwent the following: Procedure(s): ILEOSTOMY TAKEDOWN. The postoperative course was uneventful.  She was discharged to home on postoperative day 2 tolerating a regular diet, passing flatus, having bowel movements, minimal pain, ambulating, voiding. She is to be started on prophylatic eliquis and is advised to start this on Monday, May 13.   Consults: None  Significant Diagnostic Studies: Labs  Treatments: surgery: see above  Discharge Exam (from am assessment): Blood pressure 117/69, pulse 77, temperature 98.5 F (36.9 C), temperature source Oral, resp. rate 18, height 4\' 11"  (1.499 m), weight 107 lb (48.5 kg), SpO2 99 %. General: alert, cooperative, and no distress Resp: clear to auscultation bilaterally Cardio: regular rate and rhythm, S1, S2 normal, no murmur, click, rub or gallop GI: incision: abdominal dressing dry and intact over open incision and abdomen soft, active bowel sounds, non-distended Extremities: extremities normal, atraumatic, no cyanosis or edema SCDs on  Disposition: Discharge disposition: 01-Home or Self Care       Discharge Instructions     Call MD for:  difficulty breathing, headache or visual disturbances   Complete by: As directed    Call MD for:  extreme fatigue   Complete by: As directed    Call MD for:  hives   Complete by: As directed    Call MD for:  persistant dizziness or light-headedness   Complete by: As directed    Call MD for:  persistant nausea and vomiting    Complete by: As directed    Call MD for:  redness, tenderness, or signs of infection (pain, swelling, redness, odor or green/yellow discharge around incision site)   Complete by: As directed    Call MD for:  severe uncontrolled pain   Complete by: As directed    Call MD for:  temperature >100.4   Complete by: As directed    Diet - low sodium heart healthy   Complete by: As directed    Discharge wound care:   Complete by: As directed    Change the dressing once daily to the abdominal incision opening. You can place iodoform gauze lightly in the opening and cover with light 4x4 dressing   Driving Restrictions   Complete by: As directed    No driving for 1 week(s).  Do not take narcotics and drive. You need to make sure your reaction time has returned.   Increase activity slowly   Complete by: As directed    Lifting restrictions   Complete by: As directed    No lifting greater than 10 lbs, pushing, pulling, straining for 6 weeks.      Allergies as of 06/24/2022       Reactions   Latex Other (See Comments)   Sensitivity only   Sulfa Antibiotics Hives, Other (See Comments)   Sulfamethoxazole-trimethoprim Rash   Septra [bactrim] Rash        Medication List     TAKE these medications    acetaminophen 650 MG CR tablet Commonly known as: TYLENOL Take 650 mg by mouth every 8 (eight) hours as needed for pain.  apixaban 2.5 MG Tabs tablet Commonly known as: Eliquis Take 1 tablet (2.5 mg) by mouth 2 times daily. Start taking on: Jun 25, 2022 Notes to patient: DO NOT START UNTIL MONDAY, MAY 13.   diphenhydrAMINE 25 MG tablet Commonly known as: BENADRYL Take 25 mg by mouth every 6 (six) hours as needed for allergies.   famotidine 10 MG tablet Commonly known as: PEPCID Take 10 mg by mouth daily as needed for heartburn or indigestion.   LORazepam 0.5 MG tablet Commonly known as: ATIVAN Take 1 tablet (0.5 mg total) by mouth every 8 (eight) hours as needed for anxiety. Do  not take and drive, Use with caution. Do not take with pain meds What changed:  when to take this reasons to take this additional instructions   pregabalin 25 MG capsule Commonly known as: LYRICA Take 1 capsule (25 mg) by mouth at bedtime.   simethicone 125 MG chewable tablet Commonly known as: MYLICON Chew 125 mg by mouth every 6 (six) hours as needed for flatulence.   traMADol 50 MG tablet Commonly known as: ULTRAM Take 1 tablet (50 mg total) by mouth every 6 (six) hours as needed for severe pain.   Vitamin D 50 MCG (2000 UT) tablet Take 2,000 Units by mouth daily.               Discharge Care Instructions  (From admission, onward)           Start     Ordered   06/24/22 0000  Discharge wound care:       Comments: Change the dressing once daily to the abdominal incision opening. You can place iodoform gauze lightly in the opening and cover with light 4x4 dressing   06/24/22 1603            Follow-up Information     [name] D, NP Follow up on 07/15/2022.   Specialty: Gynecologic Oncology Why: at 2pm at the [facility]. Contact information: [address] [place] [address] [phone]                 Greater than thirty minutes were spend for face to face discharge instructions and discharge orders/summary in EPIC.   SignedDoylene Bode 06/24/2022, 4:07 PM

## 2022-06-24 NOTE — Progress Notes (Signed)
2 Days Post-Op Procedure(s) (LRB): ILEOSTOMY TAKEDOWN (N/A)  Subjective: Patient reports doing well this am. Tolerating soft diet. No nausea or emesis reported. Passing flatus. Mild abdominal discomfort this am but manageable. Ambulating without difficulty. Voiding. No concerns voiced.  Objective: Vital signs in last 24 hours: Temp:  [97.8 F (36.6 C)-98.6 F (37 C)] 97.8 F (36.6 C) (05/10 0611) Pulse Rate:  [68-78] 74 (05/10 0611) Resp:  [16-18] 18 (05/10 0611) BP: (95-114)/(60-84) 95/60 (05/10 0611) SpO2:  [99 %-100 %] 99 % (05/10 0611) Last BM Date : 06/21/22 (pre-op)  Intake/Output from previous day: 05/09 0701 - 05/10 0700 In: 1915.3 [P.O.:890; I.V.:1025.3] Out: 1550 [Urine:1550]  Physical Examination: General: alert, cooperative, and no distress Resp: clear to auscultation bilaterally Cardio: regular rate and rhythm, S1, S2 normal, no murmur, click, rub or gallop GI: incision: abdominal dressing dry and intact over open incision and abdomen soft, active bowel sounds, non-distended Extremities: extremities normal, atraumatic, no cyanosis or edema SCDs on  Labs: From 5/9  Assessment: 52 y.o. s/p Procedure(s): ILEOSTOMY TAKEDOWN: stable Pain:  Pain is well-controlled on PRN medications.  Heme: Hgb 9.9 and Hct 30.1 am on 5/9-appropriate given preop values.  ID: WBC 20.2 am of 5/9. Given decadron intra-op along with IV abx. Plan for repeat today.  CV: BP and HR stable. Continue to monitor with ordered vital signs.  GI:  Tolerating po: yes, soft diet. S/P ileostomy takedown.  GU: Creatinine 0.73 am of 5/9. Adequate urine output documented.   FEN: No critical values on 5/9 am labs.  Prophylaxis: SCDs and lovenox ordered.  Plan: Diet to regular Continue with dressing changes Recheck labs this am If continuing to do well and passing consistent flatus, plan for possible discharge later today. Will plan for 2 weeks of prophylactic eliquis at discharge Continue  plan of care per Dr. Pricilla Holm   LOS: 2 days    Doylene Bode 06/24/2022, 7:22 AM

## 2022-06-24 NOTE — Telephone Encounter (Signed)
Left a message for the referral coordinator at [facility] regarding status of referral. Requested a return call.

## 2022-06-24 NOTE — Plan of Care (Signed)
Patient is stable for discharge. Discharge instructions have been given. All questions answered, patient is discharged to home with spouse.  

## 2022-06-27 ENCOUNTER — Telehealth: Payer: Self-pay | Admitting: Surgery

## 2022-06-27 NOTE — Telephone Encounter (Signed)
Agree with regimen you discussed with her. As long as she continues to have flatus and tolerating PO with n/v, agree all reassuring.

## 2022-06-27 NOTE — Telephone Encounter (Signed)
Spoke with [name] this morning. She states she is eating, drinking and urinating well. She has not had a BM since Saturday AM but is passing gas. She denies fever or chills. Incisions are dry and intact. She rates her pain 3/10. Her pain is controlled with Tramadol. She is primarily taking Tylenol but takes Tramadol about once a day in the afternoon due to pain increasing with more activity. Patient wanted to know if she was ok to go 48 hours without a BM and what she needed to do. Per [name], APP advised patient to increase fiber intake, consider taking Metamucil, and to increase fluid intake. Advised patient that she can take a stool softener like Colace, but that she should not take any stimulant stool softeners. Advised patient to call our office back this afternoon or tomorrow morning if she is still unable to have BM.     Instructed to call office with any fever, chills, purulent drainage, uncontrolled pain or any other questions or concerns. Patient verbalizes understanding.   Pt aware of post op appointments as well as the office number [phone] and after hours number [phone] to call if she has any questions or concerns

## 2022-06-28 ENCOUNTER — Encounter: Payer: Self-pay | Admitting: Hematology and Oncology

## 2022-06-28 NOTE — Progress Notes (Unsigned)
Patient describes expected post operative status.  Adequate PO intake reported.  Bowels and bladder functioning without difficulty.  Pain minimal.    Previous ileostomy site assessed.

## 2022-06-28 NOTE — Telephone Encounter (Signed)
Called [place] with [facility] regarding referral and she will call Ashaunte today to schedule an appointment.

## 2022-06-30 ENCOUNTER — Other Ambulatory Visit: Payer: Self-pay

## 2022-06-30 ENCOUNTER — Inpatient Hospital Stay (HOSPITAL_BASED_OUTPATIENT_CLINIC_OR_DEPARTMENT_OTHER): Payer: 59

## 2022-06-30 VITALS — BP 108/76 | HR 106 | Temp 97.6°F | Resp 18 | Wt 107.6 lb

## 2022-06-30 DIAGNOSIS — Z9889 Other specified postprocedural states: Secondary | ICD-10-CM

## 2022-06-30 NOTE — Progress Notes (Signed)
Pt came in for RL abdominal wound check.  Dressing removed.  Wound is shallow, clean, slight pink edges and clear drainage. Cleaned with saline, packed with iodoform, applied 4x4, and tegaderm.    Pt denies pain, passing gas and having several semi-loose stools daily. Pt told to call with any other questions or concerns.

## 2022-07-01 NOTE — Progress Notes (Unsigned)
Cardiology Office Note:    Date:  07/05/2022   ID:  [name], DOB 27-Jun-1970, MRN [id number]  PCP:  Linda Inch, MD   [facility] Providers Cardiologist:  Alverda Skeans, MD Referring MD: Linda Inch, MD   Chief Complaint/Reason for Referral: Cardiology follow-up  ASSESSMENT:    1. Tachycardia   2. Aortic atherosclerosis (HCC)     PLAN:    In order of problems listed above: 1.  Tachycardia: The patient was in the hospital and receiving IV fluids and was fluid resuscitated her heart rate was normalized.  I think her tachycardia is due to volume depletion as noted by her oncologist.  The patient agrees with this assessment as well.  We will keep follow-up open-ended for now. 2.  Aortic atherosclerosis: Continue low-dose Eliquis for DVT prophylaxis; defer statin in this patient liver metastases.             Dispo:  No follow-ups on file.      Medication Adjustments/Labs and Tests Ordered: Current medicines are reviewed at length with the patient today.  Concerns regarding medicines are outlined above.  The following changes have been made:  no change   Labs/tests ordered: No orders of the defined types were placed in this encounter.   Medication Changes: No orders of the defined types were placed in this encounter.    Current medicines are reviewed at length with the patient today.  The patient does not have concerns regarding medicines.   History of Present Illness:    FOCUSED PROBLEM LIST:   1.  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  GERD 3.  Aortic atherosclerosis on CT abdomen pelvis 2023   October 2023: The patient is a 52 y.o. female with the indicated medical history here for recommendations regarding tachycardia.  The patient was seen her OB/GYN oncologic provider yesterday due to large abdominopelvic mass.  She underwent evaluation for  PE including a CT scan extremity Dopplers which were both negative.  No EKG was performed.     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  She denies any chest pain, shortness of breath, presyncope, syncope, palpitations, paroxysmal atrial dyspnea, orthopnea.  She is understandably quite anxious about her health status.  This was all first noticed when she developed some abnormal uterine bleeding and noticed mass in her left abdomen.   Yesterday she had a biopsy and an indwelling infusion catheter placed.   She does not smoke.  Plan: Check reflex TSH and echocardiogram.  Today: In the interim the patient had an echocardiogram which was reassuring.  Her TSH was normal.  In the interim she was diagnosed with ovarian cancer.  She was treated with bleomycin, etoposide, and cisplatin.  Her course has been complicated by neutropenic fever and pancytopenia.  She underwent cytoreductive surgery and intraperitoneal chemotherapy.  She was started on low-dose Eliquis for DVT prophylaxis.  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  Of note she has had an EKG in February of this year which showed sinus bradycardia.  She tells me that when she was hospitalized and she was on IV fluids her heart rate would actually become more normalized.  She has noticed that her heart rate is high but not as high as it was when I first met her.  She feels really very well.  She denies any significant shortness of breath, presyncope or syncope.  She fortunately is responding very well to her chemotherapy.  She is in very good spirits today.    Current Medications: Current Meds  Medication Sig   acetaminophen (TYLENOL) 650 MG CR tablet Take 650 mg by mouth every 8 (eight) hours as needed for pain.   apixaban (ELIQUIS) 2.5 MG TABS tablet Take 1 tablet (2.5 mg) by mouth 2 times daily.   Cholecalciferol (VITAMIN D) 50 MCG (2000 UT) tablet Take  2,000 Units by mouth daily.   diphenhydrAMINE (BENADRYL) 25 MG tablet Take 25 mg by mouth every 6 (six) hours as needed for allergies.   famotidine (PEPCID) 10 MG tablet Take 10 mg by mouth daily as needed for heartburn or indigestion.   LORazepam (ATIVAN) 0.5 MG tablet Take 1 tablet (0.5 mg total) by mouth every 8 (eight) hours as needed for anxiety. Do not take and drive, Use with caution. Do not take with pain meds   Probiotic Product (PROBIOTIC DAILY PO) Take 1 tablet by mouth daily.   Psyllium (METAMUCIL PO) Take 1 tablet by mouth daily.   simethicone (MYLICON) 125 MG chewable tablet Chew 125 mg by mouth every 6 (six) hours as needed for flatulence.   traMADol (ULTRAM) 50 MG tablet Take 1 tablet (50 mg total) by mouth every 6 (six) hours as needed for severe pain.     Allergies:    Latex, Sulfa antibiotics, Sulfamethoxazole-trimethoprim, and Septra [bactrim]   Social History:   Social History   Tobacco Use   Smoking status: Never   Smokeless tobacco: Never  Vaping Use   Vaping Use: Never used  Substance Use Topics   Alcohol use: Not Currently    Comment: Occasionally   Drug use: No     Family Hx: Family History  Problem Relation Age of Onset   Rheum arthritis Mother    Stroke Mother    Hypertension Mother    Heart disease Mother    Arthritis Mother    Breast cancer Mother 32   Heart attack Mother 61   Diabetes Father    Heart disease Father    Hyperlipidemia Father    Hypertension Father    Heart attack Father 4   Non-Hodgkin's lymphoma Paternal Uncle 68   Heart disease Maternal Grandmother    Heart attack Maternal Grandmother 16   Arthritis Maternal Grandmother    Early death Maternal Grandfather 30       MVA   Diabetes Paternal Grandmother    Hypertension Paternal Grandmother    Heart disease Paternal Grandmother    Heart attack Paternal Grandmother 49   Heart disease Paternal Grandfather    Heart attack Paternal Grandfather 49   Coronary artery disease  Other    Glaucoma Other      Review of Systems:   Please see the history of present illness.    All other systems reviewed and are negative.     EKGs/Labs/Other Test Reviewed:    EKG:  EKG performed February 2024 that I personally reviewed demonstrates sinus bradycardia  Prior CV studies:  Cardiac Studies & Procedures       ECHOCARDIOGRAM  ECHOCARDIOGRAM COMPLETE 12/29/2021  Narrative ECHOCARDIOGRAM REPORT    Patient Name:   [name] Date of Exam: 12/29/2021 Medical Rec #:  [id number]      Height:       59.0 in Accession #:    [id number]     Weight:  122.0 lb Date of Birth:  October 10, 1970      BSA:          1.495 m Patient Age:    51 years       BP:           100/60 mmHg Patient Gender: F              HR:           107 bpm. Exam Location:  Outpatient  Procedure: 2D Echo, 3D Echo, Color Doppler, Cardiac Doppler and Strain Analysis  Indications:    R00.0 Tachycardia  History:        Patient has no prior history of Echocardiogram examinations. Risk Factors:Non-Smoker. Malignant germ cell tumor of ovary; possibility of impingement of the vena cava by her abdominal mass.  Sonographer:    Jeryl Columbia RDCS Referring Phys: [id number] Orbie Pyo  IMPRESSIONS   1. Left ventricular ejection fraction, by estimation, is 55 to 60%. The left ventricle has normal function. The left ventricle has no regional wall motion abnormalities. Indeterminate diastolic filling due to E-A fusion. 2. Right ventricular systolic function is normal. The right ventricular size is normal. Tricuspid regurgitation signal is inadequate for assessing PA pressure. 3. The mitral valve is grossly normal. No evidence of mitral valve regurgitation. No evidence of mitral stenosis. 4. The aortic valve is tricuspid. Aortic valve regurgitation is not visualized. No aortic stenosis is present. 5. The inferior vena cava is normal in size with greater than 50% respiratory variability, suggesting  right atrial pressure of 3 mmHg.  Conclusion(s)/Recommendation(s): Normal biventricular function without evidence of hemodynamically significant valvular heart disease.  FINDINGS Left Ventricle: Left ventricular ejection fraction, by estimation, is 55 to 60%. The left ventricle has normal function. The left ventricle has no regional wall motion abnormalities. Global longitudinal strain performed but not reported based on interpreter judgement due to suboptimal tracking. 3D left ventricular ejection fraction analysis performed but not reported based on interpreter judgement due to suboptimal tracking. The left ventricular internal cavity size was normal in size. There is no left ventricular hypertrophy. Indeterminate diastolic filling due to E-A fusion.  Right Ventricle: The right ventricular size is normal. No increase in right ventricular wall thickness. Right ventricular systolic function is normal. Tricuspid regurgitation signal is inadequate for assessing PA pressure.  Left Atrium: Left atrial size was normal in size.  Right Atrium: Right atrial size was normal in size.  Pericardium: There is no evidence of pericardial effusion.  Mitral Valve: The mitral valve is grossly normal. No evidence of mitral valve regurgitation. No evidence of mitral valve stenosis.  Tricuspid Valve: The tricuspid valve is grossly normal. Tricuspid valve regurgitation is not demonstrated. No evidence of tricuspid stenosis.  Aortic Valve: The aortic valve is tricuspid. Aortic valve regurgitation is not visualized. No aortic stenosis is present.  Pulmonic Valve: The pulmonic valve was grossly normal. Pulmonic valve regurgitation is not visualized. No evidence of pulmonic stenosis.  Aorta: The aortic root and ascending aorta are structurally normal, with no evidence of dilitation.  Venous: The inferior vena cava is normal in size with greater than 50% respiratory variability, suggesting right atrial pressure of 3  mmHg.  IAS/Shunts: The atrial septum is grossly normal.   LEFT VENTRICLE PLAX 2D LVIDd:         3.95 cm   Diastology LVIDs:         2.14 cm   LV e' medial:    11.20 cm/s LV PW:  0.92 cm   LV E/e' medial:  6.1 LV IVS:        0.74 cm   LV e' lateral:   13.80 cm/s LVOT diam:     2.00 cm   LV E/e' lateral: 5.0 LV SV:         41 LV SV Index:   28 LVOT Area:     3.14 cm  3D Volume EF: 3D EF:        50 % LV EDV:       191 ml LV ESV:       96 ml LV SV:        95 ml  RIGHT VENTRICLE RV Basal diam:  2.72 cm RV Mid diam:    2.27 cm RV S prime:     18.60 cm/s TAPSE (M-mode): 1.9 cm  LEFT ATRIUM           Index        RIGHT ATRIUM          Index LA diam:      2.80 cm 1.87 cm/m   RA Area:     8.16 cm LA Vol (A2C): 8.1 ml  5.44 ml/m   RA Volume:   15.40 ml 10.30 ml/m LA Vol (A4C): 24.4 ml 16.32 ml/m AORTIC VALVE LVOT Vmax:   95.00 cm/s LVOT Vmean:  59.800 cm/s LVOT VTI:    0.132 m  AORTA Ao Root diam: 2.40 cm Ao Asc diam:  2.40 cm  MITRAL VALVE MV Area (PHT): 4.06 cm    SHUNTS MV Decel Time: 187 msec    Systemic VTI:  0.13 m MV E velocity: 68.60 cm/s  Systemic Diam: 2.00 cm MV A velocity: 63.30 cm/s MV E/A ratio:  1.08  Lennie Odor MD Electronically signed by Lennie Odor MD Signature Date/Time: 12/29/2021/4:27:03 PM    Final             Other studies Reviewed: Review of the additional studies/records demonstrates: None relevant  Recent Labs: 04/18/2022: Magnesium 1.8; TSH 1.497 06/15/2022: ALT 34 06/24/2022: BUN 11; Creatinine, Ser 0.76; Hemoglobin 9.4; Platelets 318; Potassium 3.9; Sodium 141   Recent Lipid Panel Lab Results  Component Value Date/Time   LDLDIRECT 105 (H) 03/23/2011 03:50 PM    Risk Assessment/Calculations:                Physical Exam:    VS:  BP 102/74   Pulse (!) 110   Ht 4\' 11"  (1.499 m)   Wt 110 lb (49.9 kg)   LMP  (LMP Unknown) Comment: tubal ligation  SpO2 99%   BMI 22.22 kg/m    Wt Readings from Last 3  Encounters:  07/05/22 110 lb (49.9 kg)  06/30/22 107 lb 9.6 oz (48.8 kg)  06/22/22 107 lb (48.5 kg)    GENERAL:  No apparent distress, AOx3 HEENT:  No carotid bruits, +2 carotid impulses, no scleral icterus CAR: RRR no murmurs, gallops, rubs, or thrills RES:  Clear to auscultation bilaterally ABD:  Soft, nontender, nondistended, positive bowel sounds x 4 VASC:  +2 radial pulses, +2 carotid pulses, palpable pedal pulses NEURO:  CN 2-12 grossly intact; motor and sensory grossly intact PSYCH:  No active depression or anxiety EXT:  No edema, ecchymosis, or cyanosis  Signed, Orbie Pyo, MD  07/05/2022 3:17 PM    [facility] [address] [place], [place], [place]  [address] Phone: [phone]; Fax: [phone]   Note:  This document was  prepared using Conservation officer, historic buildings and may include unintentional dictation errors.

## 2022-07-05 ENCOUNTER — Encounter: Payer: Self-pay | Admitting: Internal Medicine

## 2022-07-05 ENCOUNTER — Ambulatory Visit: Payer: 59 | Attending: Internal Medicine | Admitting: Internal Medicine

## 2022-07-05 VITALS — BP 102/74 | HR 110 | Ht 59.0 in | Wt 110.0 lb

## 2022-07-05 DIAGNOSIS — R000 Tachycardia, unspecified: Secondary | ICD-10-CM

## 2022-07-05 DIAGNOSIS — I700 Atherosclerosis of aorta: Secondary | ICD-10-CM

## 2022-07-05 NOTE — Patient Instructions (Signed)
Medication Instructions:  Your physician recommends that you continue on your current medications as directed. Please refer to the Current Medication list given to you today.  *If you need a refill on your cardiac medications before your next appointment, please call your pharmacy*  Follow-Up: At [facility], you and your health needs are our priority.  As part of our continuing mission to provide you with exceptional heart care, we have created designated Provider Care Teams.  These Care Teams include your primary Cardiologist (physician) and Advanced Practice Providers (APPs -  Physician Assistants and Nurse Practitioners) who all work together to provide you with the care you need, when you need it.  Your next appointment:   As needed with Dr. Lynnette Caffey

## 2022-07-14 ENCOUNTER — Other Ambulatory Visit: Payer: Self-pay | Admitting: Hematology and Oncology

## 2022-07-14 DIAGNOSIS — C569 Malignant neoplasm of unspecified ovary: Secondary | ICD-10-CM

## 2022-07-15 ENCOUNTER — Encounter: Payer: Self-pay | Admitting: Hematology and Oncology

## 2022-07-15 ENCOUNTER — Inpatient Hospital Stay (HOSPITAL_BASED_OUTPATIENT_CLINIC_OR_DEPARTMENT_OTHER): Payer: 59 | Admitting: Gynecologic Oncology

## 2022-07-15 ENCOUNTER — Inpatient Hospital Stay (HOSPITAL_BASED_OUTPATIENT_CLINIC_OR_DEPARTMENT_OTHER): Payer: 59 | Admitting: Hematology and Oncology

## 2022-07-15 VITALS — BP 129/64 | HR 111 | Temp 97.6°F | Resp 19 | Wt 112.8 lb

## 2022-07-15 DIAGNOSIS — E441 Mild protein-calorie malnutrition: Secondary | ICD-10-CM | POA: Diagnosis not present

## 2022-07-15 DIAGNOSIS — C563 Malignant neoplasm of bilateral ovaries: Secondary | ICD-10-CM | POA: Diagnosis not present

## 2022-07-15 DIAGNOSIS — Z9221 Personal history of antineoplastic chemotherapy: Secondary | ICD-10-CM | POA: Diagnosis not present

## 2022-07-15 DIAGNOSIS — N9489 Other specified conditions associated with female genital organs and menstrual cycle: Secondary | ICD-10-CM

## 2022-07-15 DIAGNOSIS — Z9889 Other specified postprocedural states: Secondary | ICD-10-CM

## 2022-07-15 DIAGNOSIS — C569 Malignant neoplasm of unspecified ovary: Secondary | ICD-10-CM | POA: Diagnosis not present

## 2022-07-15 NOTE — Assessment & Plan Note (Signed)
She is gaining weight She is deconditioned I recommend the patient to continue to focus on healthy diet I recommend the patient to schedule appointment to see personal trainer

## 2022-07-15 NOTE — Assessment & Plan Note (Signed)
She is recovering very well from side effects of treatment and recent surgery She still have some incisional pain which is not unexpected I plan to see her again in a month for further follow-up We will repeat labs, order nutritional studies, tumor markers and others She will be due for another imaging study in August

## 2022-07-15 NOTE — Progress Notes (Signed)
She is doing well. Bowels go from constipated to 15 Bms a day. Tolerating diet with no nausea or emesis. Feels she needs to work on getting strength and endurance back. Emptying bladder without difficulty. No fever, chills. BMs are soft, feels she may not be emptying completely then has to go to bathroom shortly after. Has control of Bms. Pt has gained 4 pounds.  Vitals from previous appt with Dr. Bertis Ruddy: T97.6, O2 100% on RA, Wt 112.8 lb, BP 129/64 P 111, R 19

## 2022-07-15 NOTE — Progress Notes (Signed)
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[facility]) [name] is recovering very well from side effects of treatment and recent surgery [name] still have some incisional pain which is not unexpected I plan to see her again in a month for further follow-up We will repeat labs, order nutritional studies, tumor markers and others [name] will be due for another imaging study in August  Protein-calorie malnutrition [facility]) [name] is gaining weight [name] is deconditioned I recommend the patient to continue to focus on healthy diet I recommend the patient to schedule appointment to see personal trainer  No orders of the defined types were placed in this encounter.   All questions were answered. The patient knows to call the clinic with any problems, questions or concerns. The total time spent in the appointment was 25 minutes encounter with patients including review of chart and various tests results, discussions about plan of care and coordination of care plan   [name], MD 07/15/2022 12:52 PM  INTERVAL HISTORY: Please see below for problem oriented charting. [name] returns for follow-up [name] is doing well [name] saw cardiologist recently for tachycardia but [name] is not symptomatic [name] has mild incisional pain [name] has some alternate bowel movement between constipation and loose stool It is improving Her tinnitus has resolved [name] has very mild baseline hearing deficit  REVIEW OF SYSTEMS:   Constitutional: Denies fevers, chills or abnormal weight loss Eyes: Denies blurriness of vision Ears, nose, mouth, throat, and face: Denies mucositis or sore throat Respiratory: Denies cough,  dyspnea or wheezes Cardiovascular: Denies palpitation, chest discomfort or lower extremity swelling Gastrointestinal:  Denies nausea, heartburn or change in bowel habits Skin: Denies abnormal skin rashes Lymphatics: Denies new lymphadenopathy or easy bruising Neurological:Denies numbness, tingling or new weaknesses Behavioral/Psych: Mood is stable, no new changes  All other systems were reviewed with the patient and are negative.  I have reviewed the past medical history, past surgical history, social history and family history with the patient and they are unchanged from previous note.  ALLERGIES:  is allergic to latex, sulfa antibiotics, sulfamethoxazole-trimethoprim, and septra [bactrim].  MEDICATIONS:  Current Outpatient Medications  Medication Sig Dispense Refill   acetaminophen (TYLENOL) 650 MG CR tablet Take 650 mg by mouth every 8 (eight) hours as needed for pain.     Cholecalciferol (VITAMIN D) 50 MCG (2000 UT) tablet Take 2,000 Units by mouth daily.     diphenhydrAMINE (BENADRYL) 25 MG tablet Take 25 mg by mouth every 6 (six) hours as needed for allergies.     famotidine (PEPCID) 10 MG tablet Take 10 mg by mouth daily as needed for heartburn or indigestion.     LORazepam (ATIVAN) 0.5 MG tablet Take 1 tablet (0.5 mg total) by mouth every 8 (eight) hours as needed for anxiety. Do not take and drive, Use with caution. Do not take with pain meds 30 tablet 0   Probiotic Product (PROBIOTIC DAILY PO) Take 1 tablet by mouth daily.     Psyllium (METAMUCIL PO) Take 1 tablet by mouth daily.     simethicone (MYLICON) 125 MG chewable tablet Chew 125 mg by mouth every 6 (six) hours as needed for flatulence.     traMADol [name])  50 MG tablet Take 1 tablet (50 mg total) by mouth every 6 (six) hours as needed for severe pain. 30 tablet 0   No current facility-administered medications for this visit.    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 21.1 by 10.9 by 18.7 cm abdominopelvic mass, with a satellite mass in the right upper quadrant anteriorly, and moderate ascites with tumor deposits along the pelvic ascites posteriorly. The mass could be arising from right or left ovary, or both. The mass displaces the bowel and IVC, but there is no IVC thrombus or pelvic DVT identified. 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 2. Additional satellite mass above the liver is partially included, along with tumor deposits along the ascites. 3. Low-level edema along the lateral abdominal wall musculature. 4. Flattening of the IVC at the level of the aortic bifurcation due to the large abdominopelvic tumor. No findings of pelvic DVT.     12/07/2021 Imaging   CT abdomen and pelvis 1. Large central abdominal and pelvic mass consistent with known malignancy. This is incompletely visualized by this CT of the abdomen, but was seen on recent pelvic MRI. 2. 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 Right omental implant consistent with metastatic disease. Small amount of abdominal ascites. 3. No evidence for bowel or ureteral obstruction. 4. Aortic Atherosclerosis (ICD10-I70.0). 5. Chest findings dictated separately.   12/07/2021 Imaging   CT chest 1. Negative for pulmonary embolism. And no acute or metastatic process identified in the Chest. 2. Partially visible liver mass and ascites, staging CT Abdomen today is reported separately.     12/07/2021 Procedure   Successful placement of a right internal jugular approach power injectable Port-A-Cath. The catheter is ready for immediate use.   12/07/2021 Procedure   Ultrasound-guided biopsy of  right upper quadrant mass.    12/08/2021 Initial Diagnosis   Ovarian cancer (HCC)   12/08/2021 Cancer Staging   Staging form: Ovary, Fallopian Tube, and Primary Peritoneal Carcinoma, AJCC 8th Edition - Clinical stage from 12/08/2021: FIGO Stage IIIC (cT3c, cN0, cM0) - Signed by [name], MD on 12/10/2021 Stage prefix: Initial diagnosis   12/20/2021 - 12/28/2021 Chemotherapy   Patient is on Treatment Plan : Ovarian germ cell tumor q21d x 3 Cycles     01/03/2022 Imaging   IMPRESSION: 1. Large central abdominopelvic mass compatible with malignancy, 3450 cubic cm in volume. This may be arising from the ovaries with slight eccentricity to the right in the pelvis. 2. Mild increase in size of the right omental tumor deposit. 3. Mild increase in size of the subcapsular mass along the dome of the right hepatic lobe, with suspected hepatic invasion. 4. Scattered malignant ascites. 5. No dilated bowel to suggest obstruction. 6. 2 mm left kidney lower pole nonobstructive renal calculus. 7. Mild abdominal aortic atherosclerotic vascular disease.   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  Postoperative Diagnosis: Status post R0 cytoreductive surgery and heating intraperitoneal chemotherapy  Procedures: LAPAROSCOPY, ABDOMEN, PERITONEUM, & OMENTUM, DIAGNOSTIC, W/WO COLLECTION SPECIMEN(S) BY BRUSHING OR WASHING  EXCISION/DESTRUCTION, OPEN, INTRA-ABD TUMOR/CYST/ENDOMETRIOMA, 1+ PERITONEAL/MESENTERI/RETROPERIT  5CM OR <COLECTOMY, PARTIAL; WITH ANASTOMOSIS COLOSTOMY OR SKIN LEVEL CECOSTOMY SPLENECTOMY; TOT (SEPART PROC) EXPLORATORY LAPAROTOMY, EXPLORATORY CELIOTOMY WITH OR WITHOUT BIOPSY(S) HYPERTHERMIA, EXTERNALLY GENERATED; DEEP CHEMOTHERAPY ADMINISTRATION INTO THE PERITONEAL CAVITY VIA INDWELLING PORT OR CATHETER RESECTION (INITIAL) OVARIAN, TUBAL/PRIM PERITONEAL MALIG W/BIL S&O/OMENTECT; W/RAD DISSECTION FOR  DEBULKING CHOLECYSTECTOMY APPENDECTOMY OMENTECTOMY, EPIPLOECTOMY, RESECTION OF OMENTUM Procedures: Exploratory laparotomy, total abdominal hysterectomy with radical dissection, bilateral ureterolysis, bilateral salpingo-oophorectomy, posterior cul-de-sac peritonectomy, tumor debulking by Dr Pricilla Holm with Gynecology Oncology Dr Vanessa Ralphs with surgical oncology: Diagnostic laparoscopy, exploratory laparotomy, low anterior resection with primary end-to-end anastomosis, enterolysis, cholecystectomy, tumor capsule tumor debulking, splenectomy, appendectomy, omentectomy, diverting loop ileostomy formation  Surgeon: Eugene Garnet, MD  Findings: On diagnostic laparoscopy, only able to clearly visualize the left upper quadrant. Adhesions of bowel in the right upper quadrant limiting survey, minimal ascites noted in the pelvis, scattered tumor plaques visualized on left diaphragm. Given these findings, the decision was made to convert to exploratory laparotomy. Multiple loops of small bowel encased in a Cancn like structure in the midline with multiple filmy adhesions tethering to the superior aspect of the a large pelvic mass. Multicystic and necrotic pelvic mass originating from left ovary, approximately 18 x 20 cm incorporated within the left broad ligament with the left tube draped overlying the left ovarian mass. Bilateral tubes normal in appearance, normal-appearing uterus with smooth serosa. Right adnexa similarly with approximate 10 x 8 multicystic and necrotic mass. Diffuse tumor plaques encompassing posterior cul-de-sac peritoneum. Rectosigmoid densely adhered to posterior aspect of left adnexal mass requiring dissection and ultimately resection. Scattered treated subcentimeter nodules along small bowel mesentery. 5 x 5 cm necrotic and multicystic tumor at the level of the hepatic flexure. Enlarged similar multicystic necrotic tumor adherent between the right diaphragm and the liver without any evidence of  liver invasion and tumor encased solely within the liver capsule. Multiple tumor nodules along the spleen requiring splenectomy. Omentum overall fairly normal in appearance with only scattered small tumor deposits, no omental cake was encountered. Please see surgical oncology operative note for further details regarding operative findings for their portion of the procedure.  Specimens: ID Type Source Tests Collected by Time Destination 1 : Abdominal fat pad Tissue Abdomen SURGICAL PATHOLOGY EXAM Marion Downer, MD 02/15/2022 [phone] 2 : Left adnexa Tissue Abdomen SURGICAL PATHOLOGY EXAM Marion Downer, MD 02/15/2022 [phone] 3 : right adnexa Tissue Abdomen SURGICAL PATHOLOGY EXAM Marion Downer, MD 02/15/2022 [phone] 4 : Left perimetrium Tissue Uterus SURGICAL PATHOLOGY EXAM Carver Fila, MD 02/15/2022 [phone] 5 : Uterus and cervix Tissue Uterus SURGICAL PATHOLOGY EXAM Marion Downer, MD 02/15/2022 1018 6 : Low Anterior Resection Tissue Abdomen SURGICAL PATHOLOGY EXAM Marion Downer, MD 02/15/2022 1055 7 : Right peritoneum Tissue Abdomen SURGICAL PATHOLOGY EXAM Marion Downer, MD 02/15/2022 1058 8 : Right retroperitoneal mass Tissue Abdomen SURGICAL PATHOLOGY EXAM Marion Downer, MD 02/15/2022 1101 9 : Omentum Tissue Omentum SURGICAL PATHOLOGY EXAM Marion Downer, MD 02/15/2022 1130 10 : Gallbladder Tissue Gallbladder SURGICAL PATHOLOGY EXAM Marion Downer, MD 02/15/2022 1143 11 : Right diaphragm Tissue Diaphragm SURGICAL PATHOLOGY EXAM Marion Downer, MD 02/15/2022 1143 12 : Left diaphragm nodule Tissue Diaphragm SURGICAL PATHOLOGY EXAM Marion Downer, MD 02/15/2022 1157 13 : Spleen Tissue Spleen SURGICAL PATHOLOGY EXAM Marion Downer, MD 02/15/2022 1204 14 : Appendix Tissue Appendix SURGICAL PATHOLOGY EXAM Marion Downer, MD 02/15/2022 1213 15 : Small bowel mesenteric nodules Tissue Small Bowel  SURGICAL PATHOLOGY EXAM Marion Downer, MD  02/15/2022 1215 16 : Colon mesenteric nodule Tissue Colon SURGICAL PATHOLOGY EXAM Marion Downer, MD 02/15/2022 1221 17 : Hepatic flexure mass Tissue Liver SURGICAL PATHOLOGY EXAM Marion Downer, MD 02/15/2022 1227  Complications: None  Indications for Procedure: [name] is a 52 y.o. 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 Prior to the procedure, all risks, benefits, and alternatives were discussed and informed surgical consent was signed.  Procedure: Patient was taken to the operating room where general anesthesia was achieved. [name] was positioned in dorsal lithotomy and prepped and draped per the surgical oncology team. A foley catheter was inserted into the bladder. A 5 mm skin incision was made in the left upper quadrant at Palmer's point, the abdomen was then entered with direct Optiview entry and the abdomen was insufflated. Findings were noted as above.  The laparoscope was then removed from the abdomen, the pneumoperitoneum was maintained. A vertical midline incision was made with the scalpel and the abdomen was entered sharply. The abdomen and pelvis were surveyed with findings as documented above. A wound protector was placed followed by the [facility] retractor.  The enlarged left adnexal mass was then carefully removed of its bilateral sidewall attachments using a mixture of blunt and cautery dissection. Multiple small bowel loops were adhered to the superior and posterior aspect of the mass with filmy adhesions which were carefully bluntly resected. The rectosigmoid was noted to be densely adhered to the posterior aspect of the pelvic mass. Dissection was attempted using mixture of blunt dissection as well as sharp dissection with the Metzenbaum scissors, and unavoidable enterotomy of approximately 4 cm was made to the anterior  surface of the rectosigmoid. This enterotomy was then closed with 4-0 PDS in a running fashion to minimize contamination. Further dissection removing the remainder of the rectosigmoid from the posterior aspect of the pelvic mass was then undertaken. The pelvic mass was then able to be fully elevated from the pelvis. The left infundibulopelvic ligament was able to be isolated with the left ureter clearly visualized below. The left ureter was further dissected away from the medial leaf of the broad ligament, and a vessel loop was placed. The left infundibulopelvic ligament was then isolated, clamped, and transected using the LigaSure device as well as and suture-ligated. The left utero-ovarian ligament was then isolated, clamped, cauterized and transected. The left pelvic mass was then removed entirely.  Attention was then turned to the right. An additional enlarged multicystic right adnexal mass was noted, this was carefully elevated from the pelvis using blunt dissection. The right round ligament was then grasped, elevated, and transected. The broad ligament was then opened posteriorly. The ureter was identified and the right infundibulopelvic ligament was then isolated, clamped, transected, and tied. The right utero ovarian ligament was then isolated, clamped, and transected using the LigaSure device and the right adnexal mass was removed.  Attention then was turned toward the hysterectomy. A sponge stick was placed within the vagina. The right broad ligament dissection was opened anteriorly and the bladder flap was developed. The left round ligament was then further transected, and the broad ligament opened posteriorly and anteriorly to complete the bladder flap. The right uterine artery was skeletonized, clamped, transected, and suture-ligated. There was significant parametrial tumor involvement on the left side, and therefore a radical dissection was required to isolate the blood supply. The left ureter was  further dissected down to the  level near the trigone. The left uterine artery was traced to its origin, isolated, and clamped with vascular clips. Similarly the left uterine vein due to significant necrotic tumor involvement was friable. Detachment was similarly carefully isolated, and clipped with vascular clips. Sequential clamps were then used to transect the remainder of the broad and cardinal ligaments bilaterally, with each pedicle being suture-ligated. A large right angle clamp was then able to be placed below the cervix, and the uterus and cervix were amputated from the vagina. The vaginal cuff was then closed with several figure-of-eight sutures using 0 Vicryl.    02/15/2022 Pathology Results   A: Abdominal fat pad, excision - Fibroadipose tissue with residual mixed malignant germ cell tumor with treatment effect   B: Left adnexa, salpingo-oophorectomy - Residual mixed malignant germ cell tumor with treatment effect, [id number] (See Comment and Synoptic report) - FIGO stage IIIC - Fallopian tubes with no tumor present   C: Right adnexa, salpingo-oophorectomy - Residual mixed malignant germ cell tumor with treatment effect (See Synoptic report) - Fallopian tubes not identified   D: Left parametrium, biopsy - Fibroadipose tissue with residual mixed malignant germ cell tumor with treatment effect   E: Uterus and cervix, hysterectomy - Right adnexal soft tissue with residual mixed malignant germ cell tumor with treatment effect   Myometrium: - Adenomyosis - Leiomyomata measuring up to 0.7 cm - Extensive chronic serositis   Cervix:  - Ectocervix and endocervix with chronic cervicitis    Endometrium: - Inactive endometrium   F: Low anterior resection - Residual mixed malignant  germ cell tumor with treatment effect involving colonic serosa and pericolonic adipose tissue - Proximal and distal colonic resection margins with chronic serositis, but negative for tumor    G: Right  peritoneum, biopsy - Fibroadipose tissue with residual mixed malignant germ cell tumor with treatment effect   H: Right retroperitoneal mass, excision - Fibroadipose tissue with residual mixed malignant germ cell tumor with treatment effect   I: Omentum, omentectomy - Fibroadipose tissue with residual mixed malignant germ cell tumor with treatment effect   J: Gallbladder, cholecystectomy - Gallbladder with mild chronic serositis and fibrous adhesion   K: Right diaphragm, excision - Fibrous tissue with residual mixed malignant germ cell tumor with treatment effect - Hepatic tissue with no tumor present   L: Left diaphragm nodule, biopsy - Fibrous tissue with residual mixed malignant germ cell tumor with treatment effect   M: Spleen, splenectomy - Residual mixed malignant germ cell tumor with treatment effect involving splenic capsule   N: Appendix, appendectomy - Appendix with fibrous obliteration at the tip - Periappendiceal adipose tissue with acute and chronic inflammation   O: Small bowel mesenteric nodules, excision - Fibroadipose tissue with residual mixed malignant germ cell tumor with treatment effect   P: Mesenteric nodules, excision - Fibroadipose tissue with residual mixed malignant germ cell tumor with treatment effect   Q: Hepatic flexure mass, excision - Fibroadipose tissue with residual mixed malignant germ cell tumor with treatment effect   R: Anvil and donut, biopsy - Benign segment of colon with acute and chronic inflammation and fibrous adhesion  SPECIMEN    Procedure:    Radical hysterectomy     Procedure:    Omentectomy     Procedure:    Peritoneal tumor debulking     Hysterectomy Type:    Abdominal     Specimen Integrity:          Right Ovary Integrity:    Capsule intact  Specimen Integrity:          Left Ovary Integrity:    Capsule intact   TUMOR    Tumor Site:    Bilateral ovaries     Tumor Size:    Greatest Dimension (Centimeters): 19.5 cm      Histologic Type:    Mixed malignant germ cell tumor:  yolk sac tumor, mature teratoma, mature neural elements, immature neural tissue, and immature cartilage     Ovarian Surface Involvement:    Present, right and left     Other Tissue / Organ Involvement:    Right ovary     Other Tissue / Organ Involvement:    Left ovary     Other Tissue / Organ Involvement:    Right fallopian tube     Other Tissue / Organ Involvement:    Omentum     Other Tissue / Organ Involvement:    Diaphragm and splenic capsule     Largest Extrapelvic Peritoneal Focus:    Macroscopic (greater than 2 cm): Splenic capsule     Peritoneal / Ascitic Fluid Involvement:    Not submitted / unknown   REGIONAL LYMPH NODES     Regional Lymph Node Status:    Not applicable (no regional lymph nodes submitted or found)   DISTANT METASTASIS     Distant Site(s) Involved:    Diaphragm, splenic capsule   pTNM CLASSIFICATION (AJCC 8th Edition)     Reporting of pT, pN, and (when applicable) pM categories is based on information available to the pathologist at the time the report is issued. As per the AJCC (Chapter 1, 8th Ed.) it is the managing physician's responsibility to establish the final pathologic stage based upon all pertinent information, including but potentially not limited to this pathology report.     Modified Classification:    y     pT Category:    pT3c     pN Category:    pN not assigned (no nodes submitted or found)     pM Category:    pM1   FIGO STAGE     FIGO Stage:    IIIC   COMMENT:  The needle core biopsies show a heterogeneous combination of elements including abundant neural tissue with atypical and immature features. There are also atypical glandular epithelial structures and focal yolk sac elements with microcystic pattern.  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  Immunohistochemistry is performed for cytokeratin AE1/AE3,  CD117, CD30, G FAP, glypican-3, OCT3/4, synaptophysin, S100 and CD30.     03/14/2022 - 05/02/2022 Chemotherapy   Patient is on Treatment Plan : Malignant germ cell tumor of ovary D1-5 q21d     03/22/2022 Tumor Marker   Patient's tumor was tested for the following markers: AFP. Results of the tumor marker test revealed 7.9.   04/23/2022 Genetic Testing   Negative genetic testing on the CancerNext-Expanded+RNAinsight panel.  The report date is April 23, 2022.  The CancerNext-Expanded gene panel offered by [facility] and includes sequencing and rearrangement analysis for the following 77 genes: AIP, ALK, APC*, ATM*, AXIN2, BAP1, BARD1, BMPR1A, BRCA1*, BRCA2*, BRIP1*, CDC73, CDH1*, CDK4, CDKN1B, CDKN2A, CHEK2*, CTNNA1, DICER1, FH, FLCN, KIF1B, LZTR1, MAX, MEN1, MET, MLH1*, MSH2*, MSH3, MSH6*, MUTYH*, NF1*, NF2, NTHL1, PALB2*, PHOX2B, PMS2*, POT1, PRKAR1A, PTCH1, PTEN*, RAD51C*, RAD51D*, RB1, RET, SDHA, SDHAF2, SDHB, SDHC, SDHD, SMAD4, SMARCA4, SMARCB1, SMARCE1, STK11, SUFU, TMEM127, TP53*, TSC1, TSC2, and VHL (sequencing and  deletion/duplication); EGFR, EGLN1, HOXB13, KIT, MITF, PDGFRA, POLD1, and POLE (sequencing only); EPCAM and GREM1 (deletion/duplication only). DNA and RNA analyses performed for * genes.    05/17/2022 Imaging   CT ABDOMEN PELVIS W WO CONTRAST  Result Date: 05/17/2022 CLINICAL DATA:  History of ovarian cancer status post recent section and completion of chemotherapy 1-1/2 weeks ago. Evaluation of possible ileostomy reversal. EXAM: CT ABDOMEN AND PELVIS WITHOUT AND WITH CONTRAST TECHNIQUE: Multidetector CT imaging of the abdomen and pelvis was performed following the standard protocol before and following the bolus administration of intravenous contrast. RADIATION DOSE REDUCTION: This exam was performed according to the departmental dose-optimization program which includes automated exposure control, adjustment of the mA and/or kV according to patient size and/or use of iterative  reconstruction technique. CONTRAST:  OMNIPAQUE IOHEXOL 300 MG/ML  SOLN COMPARISON:  CT abdomen and pelvis dated 02/09/2022 FINDINGS: Lower chest: No focal consolidation or pulmonary nodule in the lung bases. No pleural effusion or pneumothorax demonstrated. Partially imaged heart size is normal. Hepatobiliary: Postsurgical changes of the right hepatic dome with irregular capsular retraction. Punctate adjacent focus of subdiaphragmatic calcification (11:32). Unchanged subcentimeter hypoattenuating focus along posterior segment 7 (4:18). No intra or extrahepatic biliary ductal dilation. Cholecystectomy. Pancreas: No focal lesions or main ductal dilation. Spleen: Splenectomy. Lobulated, enhancing soft tissue density along the left hemidiaphragm at the splenectomy site measures 1.5 x 1.0 cm (4:11). Adrenals/Urinary Tract: No adrenal nodules. No suspicious renal mass or hydronephrosis. Punctate nonobstructing left lower pole stones. No focal bladder wall thickening. Stomach/Bowel: Normal appearance of the stomach. Right upper quadrant ileostomy. Enteric contrast material is present within the medial limb of the ileostomy. Appendectomy. Vascular/Lymphatic: Aortic atherosclerosis. No enlarged abdominal or pelvic lymph nodes. Reproductive: Hysterectomy and bilateral salpingo-oophorectomy. Ill-defined soft tissue near the left vaginal (4:66). No adnexal masses. Other: Status post omentectomy. Ill-defined lateral peritoneal thickening, for example series 4 image 38 on the right and image 36 on the left associated with scattered foci of mesenteric nodularity, for example 7 x 5 mm (4:34). Musculoskeletal: No acute or abnormal lytic or blastic osseous lesions. Postsurgical changes of the anterior abdominal wall. IMPRESSION: 1. Extensive postsurgical changes of the abdomen and pelvis. Right upper quadrant ileostomy with enteric contrast material present within the medial limb of the ileostomy. No evidence of obstruction. 2.  Multifocal ill-defined soft tissue densities as described, indeterminate, may reflect postsurgical change versus residual tumor. 3. Lobulated, enhancing soft tissue density along the left hemidiaphragm at the splenectomy site may represent residual/recurrent splenic tissue versus residual tumor, although the enhancement pattern does not appear consistent with pre-surgical appearance of the malignancy. 4.  Aortic Atherosclerosis (ICD10-I70.0). Electronically Signed   By: Agustin Cree M.D.   On: 05/17/2022 16:43      06/09/2022 Imaging   CT ABDOMEN PELVIS WO CONTRAST  Result Date: 06/09/2022 CLINICAL DATA:  Ovarian cancer, status post chemotherapy. Rectal contrast administered to evaluate for anastomotic patency prior to reversal. EXAM: CT ABDOMEN AND PELVIS WITHOUT CONTRAST TECHNIQUE: Multidetector CT imaging of the abdomen and pelvis was performed following the standard protocol without IV contrast. RADIATION DOSE REDUCTION: This exam was performed according to the departmental dose-optimization program which includes automated exposure control, adjustment of the mA and/or kV according to patient size and/or use of iterative reconstruction technique. COMPARISON:  05/17/2022 FINDINGS: Lower chest: Lung bases are clear. Hepatobiliary: Unenhanced liver is unremarkable. Status post cholecystectomy. No intrahepatic or extrahepatic ductal dilatation. Pancreas: Within normal limits. 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 Adrenals/Urinary Tract: Adrenal glands are within normal limits. Right kidney is within normal limits. 3 mm nonobstructing left lower pole renal calculus (series 2/image 22). No hydronephrosis. Bladder is within normal limits. Stomach/Bowel: Stomach is within normal limits. No evidence of bowel obstruction. 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 Status post left hemicolectomy with suture line in the lower pelvis (series 2/image 87). Rectal contrast confirms a  patent anastomosis without evidence of leak. Vascular/Lymphatic: No evidence of abdominal aortic aneurysm. Atherosclerotic calcifications of the abdominal aorta and branch vessels. No suspicious abdominopelvic lymphadenopathy. Reproductive: Status post hysterectomy and suspected bilateral salpingo oophorectomy. Other: No abdominopelvic ascites. Mild peritoneal nodularity along the lateral left mid abdomen, with discrete 8 mm nodule (series 2/image 33), more conspicuous than on the prior. Mild nonspecific mesenteric stranding along the jejunal mesentery in central left mid abdomen (series 2/image 30). Mildly prominent soft tissue along the left pelvic cul-de-sac measuring up to 2.0 cm (series 2/image 59), grossly unchanged. Overall appearance suggests very mild peritoneal disease which is overall grossly unchanged. Musculoskeletal: Visualized osseous structures are within normal limits. 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 Rectal contrast confirms a patent anastomosis without evidence of leak. 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 Additional ancillary findings as above. Electronically Signed   By: Charline Bills M.D.   On: 06/09/2022 01:59   CT ABDOMEN PELVIS W WO CONTRAST  Result Date: 05/17/2022 CLINICAL DATA:  History of ovarian cancer status post recent section and completion of chemotherapy 1-1/2 weeks ago. Evaluation of possible ileostomy reversal. EXAM: CT ABDOMEN AND PELVIS WITHOUT AND WITH CONTRAST TECHNIQUE: Multidetector CT imaging of the abdomen and pelvis was performed following the standard protocol before and following the bolus administration of intravenous contrast. RADIATION DOSE REDUCTION: This exam was performed according to the departmental dose-optimization program which includes automated exposure control, adjustment of the mA and/or kV according to patient size and/or use of iterative  reconstruction technique. CONTRAST:  OMNIPAQUE IOHEXOL 300 MG/ML  SOLN COMPARISON:  CT abdomen and pelvis dated 02/09/2022 FINDINGS: Lower chest: No focal consolidation or pulmonary nodule in the lung bases. No pleural effusion or pneumothorax demonstrated. Partially imaged heart size is normal. Hepatobiliary: Postsurgical changes of the right hepatic dome with irregular capsular retraction. Punctate adjacent focus of subdiaphragmatic calcification (11:32). Unchanged subcentimeter hypoattenuating focus along posterior segment 7 (4:18). No intra or extrahepatic biliary ductal dilation. Cholecystectomy. Pancreas: No focal lesions or main ductal dilation. Spleen: Splenectomy. Lobulated, enhancing soft tissue density along the left hemidiaphragm at the splenectomy site measures 1.5 x 1.0 cm (4:11). Adrenals/Urinary Tract: No adrenal nodules. No suspicious renal mass or hydronephrosis. Punctate nonobstructing left lower pole stones. No focal bladder wall thickening. Stomach/Bowel: Normal appearance of the stomach. Right upper quadrant ileostomy. Enteric contrast material is present within the medial limb of the ileostomy. Appendectomy. Vascular/Lymphatic: Aortic atherosclerosis. No enlarged abdominal or pelvic lymph nodes. Reproductive: Hysterectomy and bilateral salpingo-oophorectomy. Ill-defined soft tissue near the left vaginal (4:66). No adnexal masses. Other: Status post omentectomy. Ill-defined lateral peritoneal thickening, for example series 4 image 38 on the right and image 36 on the left associated with scattered foci of mesenteric nodularity, for example 7 x 5 mm (4:34). Musculoskeletal: No acute or abnormal lytic or blastic osseous lesions. Postsurgical changes of the anterior abdominal wall. IMPRESSION: 1. Extensive postsurgical changes of the abdomen and pelvis. Right upper quadrant ileostomy with enteric contrast material present within the medial limb of the ileostomy. No evidence  of obstruction. 2.  Multifocal ill-defined soft tissue densities as described, indeterminate, may reflect postsurgical change versus residual tumor. 3. Lobulated, enhancing soft tissue density along the left hemidiaphragm at the splenectomy site may represent residual/recurrent splenic tissue versus residual tumor, although the enhancement pattern does not appear consistent with pre-surgical appearance of the malignancy. 4.  Aortic Atherosclerosis (ICD10-I70.0). Electronically Signed   By: Agustin Cree M.D.   On: 05/17/2022 16:43      07/01/2022 Surgery   Pre-operative Diagnosis: Ileostomy, planned reversal   Post-operative Diagnosis: same as above   Operation: Ileostomy takedown   Surgeon: Eugene Garnet MD    Operative Findings: Normal-appearing loop ileostomy with some mild retraction.  Some filmy adhesions around the loop of ileum itself and its mesentery.  Fascia easily identified and normal in appearance.  Ileum freed from surrounding anterior abdominal wall peritoneum.       PHYSICAL EXAMINATION: ECOG PERFORMANCE STATUS: 1 - Symptomatic but completely ambulatory  Vitals:   07/15/22 1151  BP: 129/64  Pulse: (!) 111  Resp: 19  Temp: 97.6 F (36.4 C)  SpO2: 100%   Filed Weights   07/15/22 1151  Weight: 112 lb 12.8 oz (51.2 kg)    GENERAL:alert, no distress and comfortable SKIN: skin color, texture, turgor are normal, no rashes or significant lesions EYES: normal, Conjunctiva are pink and non-injected, sclera clear OROPHARYNX:no exudate, no erythema and lips, buccal mucosa, and tongue normal  NECK: supple, thyroid normal size, non-tender, without nodularity LYMPH:  no palpable lymphadenopathy in the cervical, axillary or inguinal LUNGS: clear to auscultation and percussion with normal breathing effort HEART: regular rate & rhythm and no murmurs and no lower extremity edema ABDOMEN:abdomen soft, non-tender and normal bowel sounds.  Noted well-healed surgical scar Musculoskeletal:no cyanosis  of digits and no clubbing  NEURO: alert & oriented x 3 with fluent speech, no focal motor/sensory deficits  LABORATORY DATA:  I have reviewed the data as listed    Component Value Date/Time   NA 141 06/24/2022 1017   K 3.9 06/24/2022 1017   CL 107 06/24/2022 1017   CO2 27 06/24/2022 1017   GLUCOSE 97 06/24/2022 1017   BUN 11 06/24/2022 1017   CREATININE 0.76 06/24/2022 1017   CREATININE 0.57 04/18/2022 1202   CREATININE 0.75 03/23/2011 1550   CALCIUM 8.7 (L) 06/24/2022 1017   PROT 7.0 06/15/2022 0915   ALBUMIN 4.1 06/15/2022 0915   AST 22 06/15/2022 0915   AST 38 04/18/2022 1202   ALT 34 06/15/2022 0915   ALT 28 04/18/2022 1202   ALKPHOS 49 06/15/2022 0915   BILITOT 0.7 06/15/2022 0915   BILITOT 0.2 (L) 04/18/2022 1202   GFRNONAA >60 06/24/2022 1017   GFRNONAA >60 04/18/2022 1202    No results found for: "SPEP", "UPEP"  Lab Results  Component Value Date   WBC 13.5 (H) 06/24/2022   NEUTROABS 8.4 (H) 06/24/2022   HGB 9.4 (L) 06/24/2022   HCT 29.0 (L) 06/24/2022   MCV 104.3 (H) 06/24/2022   PLT 318 06/24/2022      Chemistry      Component Value Date/Time   NA 141 06/24/2022 1017   K 3.9 06/24/2022 1017   CL 107 06/24/2022 1017   CO2 27 06/24/2022 1017   BUN 11 06/24/2022 1017   CREATININE 0.76 06/24/2022 1017   CREATININE 0.57 04/18/2022 1202   CREATININE 0.75 03/23/2011 1550      Component Value Date/Time   CALCIUM 8.7 (L) 06/24/2022 1017   ALKPHOS 49  06/15/2022 0915   AST 22 06/15/2022 0915   AST 38 04/18/2022 1202   ALT 34 06/15/2022 0915   ALT 28 04/18/2022 1202   BILITOT 0.7 06/15/2022 0915   BILITOT 0.2 (L) 04/18/2022 1202

## 2022-07-15 NOTE — Patient Instructions (Signed)
Plan to follow up with Dr. Bertis Ruddy as scheduled and our office in August with a CT scan before your August appointment.  Continue with dry dressing changes. You no longer need to use iodoform packing.   You can use imodium or miralax as needed based on the consistency of your bowels.  Please call for any needs, new symptoms.  Symptoms to report to your health care team include vaginal bleeding, rectal bleeding, bloating, weight loss without effort, new and persistent pain, new and  persistent fatigue, new leg swelling, new masses (i.e., bumps in your neck or groin), new and persistent cough, new and persistent nausea and vomiting, change in bowel or bladder habits, and any other concerns.

## 2022-07-18 ENCOUNTER — Encounter: Payer: Self-pay | Admitting: Hematology and Oncology

## 2022-07-23 ENCOUNTER — Other Ambulatory Visit (HOSPITAL_COMMUNITY): Payer: Self-pay

## 2022-08-08 ENCOUNTER — Inpatient Hospital Stay: Payer: 59 | Attending: Gynecologic Oncology

## 2022-08-08 DIAGNOSIS — D72829 Elevated white blood cell count, unspecified: Secondary | ICD-10-CM | POA: Insufficient documentation

## 2022-08-08 DIAGNOSIS — Z9049 Acquired absence of other specified parts of digestive tract: Secondary | ICD-10-CM | POA: Insufficient documentation

## 2022-08-08 DIAGNOSIS — Z9081 Acquired absence of spleen: Secondary | ICD-10-CM | POA: Insufficient documentation

## 2022-08-08 DIAGNOSIS — Z452 Encounter for adjustment and management of vascular access device: Secondary | ICD-10-CM | POA: Insufficient documentation

## 2022-08-08 DIAGNOSIS — Z9221 Personal history of antineoplastic chemotherapy: Secondary | ICD-10-CM | POA: Diagnosis not present

## 2022-08-08 DIAGNOSIS — C569 Malignant neoplasm of unspecified ovary: Secondary | ICD-10-CM

## 2022-08-08 DIAGNOSIS — Z8543 Personal history of malignant neoplasm of ovary: Secondary | ICD-10-CM | POA: Insufficient documentation

## 2022-08-08 DIAGNOSIS — D75839 Thrombocytosis, unspecified: Secondary | ICD-10-CM | POA: Insufficient documentation

## 2022-08-08 LAB — COMPREHENSIVE METABOLIC PANEL
ALT: 30 U/L (ref 0–44)
AST: 23 U/L (ref 15–41)
Albumin: 3.9 g/dL (ref 3.5–5.0)
Alkaline Phosphatase: 49 U/L (ref 38–126)
Anion gap: 7 (ref 5–15)
BUN: 18 mg/dL (ref 6–20)
CO2: 27 mmol/L (ref 22–32)
Calcium: 9.5 mg/dL (ref 8.9–10.3)
Chloride: 107 mmol/L (ref 98–111)
Creatinine, Ser: 0.76 mg/dL (ref 0.44–1.00)
GFR, Estimated: >60 mL/min (ref >60)
Glucose, Bld: 109 mg/dL — ABNORMAL HIGH (ref 70–99)
Potassium: 3.9 mmol/L (ref 3.5–5.1)
Sodium: 141 mmol/L (ref 135–145)
Total Bilirubin: 0.2 mg/dL — ABNORMAL LOW (ref 0.3–1.2)
Total Protein: 6.4 g/dL — ABNORMAL LOW (ref 6.5–8.1)

## 2022-08-08 LAB — CBC WITH DIFFERENTIAL/PLATELET
Abs Immature Granulocytes: 0.05 10*3/uL (ref 0.00–0.07)
Basophils Absolute: 0.1 10*3/uL (ref 0.0–0.1)
Basophils Relative: 1 %
Eosinophils Absolute: 0.4 10*3/uL (ref 0.0–0.5)
Eosinophils Relative: 3 %
HCT: 36.7 % (ref 36.0–46.0)
Hemoglobin: 12.1 g/dL (ref 12.0–15.0)
Immature Granulocytes: 1 %
Lymphocytes Relative: 24 %
Lymphs Abs: 2.6 10*3/uL (ref 0.7–4.0)
MCH: 32.8 pg (ref 26.0–34.0)
MCHC: 33 g/dL (ref 30.0–36.0)
MCV: 99.5 fL (ref 80.0–100.0)
Monocytes Absolute: 1.4 10*3/uL — ABNORMAL HIGH (ref 0.1–1.0)
Monocytes Relative: 13 %
Neutro Abs: 6.4 10*3/uL (ref 1.7–7.7)
Neutrophils Relative %: 58 %
Platelets: 439 10*3/uL — ABNORMAL HIGH (ref 150–400)
RBC: 3.69 MIL/uL — ABNORMAL LOW (ref 3.87–5.11)
RDW: 12 % (ref 11.5–15.5)
WBC: 10.9 10*3/uL — ABNORMAL HIGH (ref 4.0–10.5)
nRBC: 0 % (ref 0.0–0.2)

## 2022-08-08 LAB — IRON AND IRON BINDING CAPACITY (CC-WL,HP ONLY)
Iron: 84 ug/dL (ref 28–170)
Saturation Ratios: 30 % (ref 10.4–31.8)
TIBC: 280 ug/dL (ref 250–450)
UIBC: 196 ug/dL (ref 148–442)

## 2022-08-08 LAB — VITAMIN B12: Vitamin B-12: 366 pg/mL (ref 180–914)

## 2022-08-08 LAB — FERRITIN: Ferritin: 460 ng/mL — ABNORMAL HIGH (ref 11–307)

## 2022-08-08 MED ORDER — SODIUM CHLORIDE 0.9% FLUSH
10.0000 mL | Freq: Once | INTRAVENOUS | Status: AC
  Administered 2022-08-08: 10 mL

## 2022-08-08 MED ORDER — HEPARIN SOD (PORK) LOCK FLUSH 100 UNIT/ML IV SOLN
500.0000 [IU] | Freq: Once | INTRAVENOUS | Status: AC
  Administered 2022-08-08: 500 [IU]

## 2022-08-09 LAB — BETA HCG QUANT (REF LAB): hCG Quant: <1 m[IU]/mL

## 2022-08-09 LAB — CA 125: Cancer Antigen (CA) 125: 12.6 U/mL (ref 0.0–38.1)

## 2022-08-10 LAB — AFP TUMOR MARKER: AFP, Serum, Tumor Marker: 3 ng/mL (ref 0.0–6.4)

## 2022-08-11 ENCOUNTER — Inpatient Hospital Stay (HOSPITAL_BASED_OUTPATIENT_CLINIC_OR_DEPARTMENT_OTHER): Payer: 59 | Admitting: Hematology and Oncology

## 2022-08-11 ENCOUNTER — Other Ambulatory Visit: Payer: Self-pay

## 2022-08-11 ENCOUNTER — Encounter: Payer: Self-pay | Admitting: Hematology and Oncology

## 2022-08-11 VITALS — BP 129/75 | HR 109 | Temp 97.9°F | Resp 18 | Ht 59.0 in | Wt 112.2 lb

## 2022-08-11 DIAGNOSIS — C569 Malignant neoplasm of unspecified ovary: Secondary | ICD-10-CM

## 2022-08-11 DIAGNOSIS — H9109 Ototoxic hearing loss, unspecified ear: Secondary | ICD-10-CM

## 2022-08-11 DIAGNOSIS — Z9081 Acquired absence of spleen: Secondary | ICD-10-CM

## 2022-08-11 DIAGNOSIS — Z452 Encounter for adjustment and management of vascular access device: Secondary | ICD-10-CM | POA: Diagnosis not present

## 2022-08-11 NOTE — Assessment & Plan Note (Signed)
She has received appropriate vaccinations She has very mild leukocytosis and thrombocytosis which is not unexpected given her post splenectomy state She is reassured

## 2022-08-11 NOTE — Progress Notes (Signed)
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   All questions were answered. The patient knows to call the clinic with any problems, questions or concerns. The total time spent in the appointment was 20 minutes encounter with patients including review of chart and various tests results, discussions about plan of care and coordination of care plan   [name], MD 08/11/2022 1:03 PM  INTERVAL HISTORY: Please see below for problem oriented charting. she returns for surveillance follow-up She is feeling well Denies abdominal pain She has occasional changes in bowel habits alternating between frequent stools and constipation  Her hearing deficits are gradually improving  REVIEW OF SYSTEMS:   Constitutional: Denies fevers, chills or abnormal weight loss Eyes: Denies blurriness of vision Ears, nose, mouth, throat, and face: Denies mucositis or sore throat Respiratory: Denies cough, dyspnea or wheezes Cardiovascular: Denies palpitation, chest discomfort or lower extremity swelling Gastrointestinal:  Denies nausea, heartburn or change in bowel habits Skin: Denies abnormal skin  rashes Lymphatics: Denies new lymphadenopathy or easy bruising Neurological:Denies numbness, tingling or new weaknesses Behavioral/Psych: Mood is stable, no new changes  All other systems were reviewed with the patient and are negative.  I have reviewed the past medical history, past surgical history, social history and family history with the patient and they are unchanged from previous note.  ALLERGIES:  is allergic to latex, sulfa antibiotics, sulfamethoxazole-trimethoprim, and septra [bactrim].  MEDICATIONS:  Current Outpatient Medications  Medication Sig Dispense Refill   acetaminophen (TYLENOL) 650 MG CR tablet Take 650 mg by mouth every 8 (eight) hours as needed for pain.     Cholecalciferol (VITAMIN D) 50 MCG (2000 UT) tablet Take 2,000 Units by mouth daily.     diphenhydrAMINE (BENADRYL) 25 MG tablet Take 25 mg by mouth every 6 (six) hours as needed for allergies.     famotidine (PEPCID) 10 MG tablet Take 10 mg by mouth daily as needed for heartburn or indigestion.     LORazepam (ATIVAN) 0.5 MG tablet Take 1 tablet (0.5 mg total) by mouth every 8 (eight) hours as needed for anxiety. Do not take and drive, Use with caution. Do not take with pain meds 30 tablet 0   Psyllium (METAMUCIL PO) Take 1 tablet by mouth daily.     No current facility-administered medications for this visit.    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 21.1 by 10.9 by 18.7 cm abdominopelvic mass, with a satellite mass in the right upper  quadrant anteriorly, and moderate ascites with tumor deposits along the pelvic ascites posteriorly. The mass could be arising from right or left ovary, or both. The mass displaces the bowel and IVC, but there is no IVC thrombus or pelvic DVT  identified. 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 2. Additional satellite mass above the liver is partially included, along with tumor deposits along the ascites. 3. Low-level edema along the lateral abdominal wall musculature. 4. Flattening of the IVC at the level of the aortic bifurcation due to the large abdominopelvic tumor. No findings of pelvic DVT.     12/07/2021 Imaging   CT abdomen and pelvis 1. Large central abdominal and pelvic mass consistent with known malignancy. This is incompletely visualized by this CT of the abdomen, but was seen on recent pelvic MRI. 2. 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 Right omental implant consistent with metastatic disease. Small amount of abdominal ascites. 3. No evidence for bowel or ureteral obstruction. 4. Aortic Atherosclerosis (ICD10-I70.0). 5. Chest findings dictated separately.   12/07/2021 Imaging   CT chest 1. Negative for pulmonary embolism. And no acute or metastatic process identified in the Chest. 2. Partially visible liver mass and ascites, staging CT Abdomen today is reported separately.     12/07/2021 Procedure   Successful placement of a right internal jugular approach power injectable Port-A-Cath. The catheter is ready for immediate use.   12/07/2021 Procedure   Ultrasound-guided biopsy of right upper quadrant mass.    12/08/2021 Initial Diagnosis   Ovarian cancer (HCC)   12/08/2021 Cancer Staging   Staging form: Ovary, Fallopian Tube, and Primary Peritoneal Carcinoma, AJCC 8th Edition - Clinical stage from 12/08/2021: FIGO Stage IIIC (cT3c, cN0, cM0) - Signed by [name], MD on 12/10/2021 Stage prefix: Initial diagnosis   12/20/2021 - 12/28/2021 Chemotherapy   Patient is on Treatment Plan : Ovarian germ cell tumor q21d x 3 Cycles     01/03/2022 Imaging   IMPRESSION: 1. Large central  abdominopelvic mass compatible with malignancy, 3450 cubic cm in volume. This may be arising from the ovaries with slight eccentricity to the right in the pelvis. 2. Mild increase in size of the right omental tumor deposit. 3. Mild increase in size of the subcapsular mass along the dome of the right hepatic lobe, with suspected hepatic invasion. 4. Scattered malignant ascites. 5. No dilated bowel to suggest obstruction. 6. 2 mm left kidney lower pole nonobstructive renal calculus. 7. Mild abdominal aortic atherosclerotic vascular disease.   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  Postoperative Diagnosis: Status post R0 cytoreductive surgery and heating intraperitoneal chemotherapy  Procedures: LAPAROSCOPY, ABDOMEN, PERITONEUM, & OMENTUM, DIAGNOSTIC, W/WO COLLECTION SPECIMEN(S) BY BRUSHING OR WASHING  EXCISION/DESTRUCTION, OPEN, INTRA-ABD TUMOR/CYST/ENDOMETRIOMA, 1+ PERITONEAL/MESENTERI/RETROPERIT 5CM OR <COLECTOMY, PARTIAL; WITH ANASTOMOSIS COLOSTOMY OR SKIN LEVEL CECOSTOMY SPLENECTOMY; TOT (SEPART PROC) EXPLORATORY LAPAROTOMY, EXPLORATORY CELIOTOMY WITH OR WITHOUT BIOPSY(S) HYPERTHERMIA, EXTERNALLY GENERATED; DEEP CHEMOTHERAPY ADMINISTRATION INTO THE PERITONEAL CAVITY VIA INDWELLING PORT OR CATHETER RESECTION (INITIAL) OVARIAN, TUBAL/PRIM PERITONEAL MALIG W/BIL S&O/OMENTECT; W/RAD DISSECTION FOR DEBULKING CHOLECYSTECTOMY APPENDECTOMY OMENTECTOMY, EPIPLOECTOMY, RESECTION OF OMENTUM Procedures: Exploratory laparotomy, total abdominal hysterectomy with radical dissection, bilateral ureterolysis, bilateral salpingo-oophorectomy, posterior cul-de-sac peritonectomy, tumor debulking by Dr Pricilla Holm with Gynecology Oncology Dr Vanessa Ralphs with surgical oncology: Diagnostic laparoscopy, exploratory laparotomy, low anterior resection with primary end-to-end anastomosis, enterolysis, cholecystectomy, tumor capsule tumor  debulking, splenectomy, appendectomy, omentectomy, diverting loop ileostomy formation  Surgeon: Eugene Garnet, MD  Findings: On diagnostic laparoscopy, only able to clearly visualize the left upper quadrant. Adhesions of bowel in the right upper quadrant limiting survey, minimal ascites noted in the pelvis, scattered tumor plaques visualized on left diaphragm. Given these findings, the decision was made to convert to exploratory laparotomy. Multiple loops of small bowel encased in a Cancn like structure in the midline with multiple filmy adhesions tethering to the superior aspect of the a large pelvic mass. Multicystic and necrotic pelvic mass originating from left ovary, approximately 18 x 20 cm incorporated within the left broad ligament with the left tube draped overlying the left ovarian mass. Bilateral tubes normal in appearance, normal-appearing uterus with smooth serosa. Right adnexa similarly with approximate 10 x 8 multicystic and necrotic mass. Diffuse tumor plaques encompassing posterior cul-de-sac peritoneum. Rectosigmoid densely adhered to posterior aspect of left adnexal mass requiring dissection and ultimately resection. Scattered treated subcentimeter nodules along small bowel mesentery. 5 x 5 cm necrotic and multicystic tumor at the level of the hepatic flexure. Enlarged similar multicystic necrotic tumor adherent between the right diaphragm and the liver without any evidence of liver invasion and tumor encased solely within the liver capsule. Multiple tumor nodules along the spleen requiring splenectomy. Omentum overall fairly normal in appearance with only scattered small tumor deposits, no omental cake was encountered. Please see surgical oncology operative note for further details regarding operative findings for their portion of the procedure.  Specimens: ID Type Source Tests Collected by Time Destination 1 : Abdominal fat pad Tissue Abdomen SURGICAL PATHOLOGY EXAM Marion Downer, MD 02/15/2022 [phone] 2 : Left adnexa Tissue Abdomen SURGICAL PATHOLOGY EXAM Marion Downer, MD 02/15/2022 [phone] 3 : right adnexa Tissue Abdomen SURGICAL PATHOLOGY EXAM Marion Downer, MD 02/15/2022 [phone] 4 : Left perimetrium Tissue Uterus SURGICAL PATHOLOGY EXAM Carver Fila, MD 02/15/2022 [phone] 5 : Uterus and cervix Tissue Uterus SURGICAL PATHOLOGY EXAM Marion Downer, MD 02/15/2022 1018 6 : Low Anterior Resection Tissue Abdomen SURGICAL PATHOLOGY EXAM Marion Downer, MD 02/15/2022 1055 7 : Right peritoneum Tissue Abdomen SURGICAL PATHOLOGY EXAM Marion Downer, MD 02/15/2022 1058 8 : Right retroperitoneal mass Tissue Abdomen SURGICAL PATHOLOGY EXAM Marion Downer, MD 02/15/2022 1101 9 : Omentum Tissue Omentum SURGICAL PATHOLOGY EXAM Marion Downer, MD 02/15/2022 1130 10 : Gallbladder Tissue Gallbladder SURGICAL PATHOLOGY EXAM Marion Downer, MD 02/15/2022 1143 11 : Right diaphragm Tissue Diaphragm SURGICAL PATHOLOGY EXAM Marion Downer, MD 02/15/2022 1143 12 : Left diaphragm nodule Tissue Diaphragm SURGICAL PATHOLOGY EXAM Marion Downer, MD 02/15/2022 1157 13 : Spleen Tissue Spleen SURGICAL PATHOLOGY EXAM Marion Downer, MD 02/15/2022 1204 14 : Appendix Tissue Appendix SURGICAL PATHOLOGY EXAM Marion Downer, MD 02/15/2022 1213 15 : Small bowel mesenteric nodules Tissue Small Bowel SURGICAL PATHOLOGY EXAM Marion Downer, MD 02/15/2022 1215 16 : Colon mesenteric nodule Tissue Colon SURGICAL PATHOLOGY EXAM Marion Downer, MD 02/15/2022 1221 17 : Hepatic flexure mass Tissue Liver SURGICAL PATHOLOGY EXAM Marion Downer, MD 02/15/2022 1227  Complications: None  Indications for Procedure: [name] is a 52 y.o. 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 Prior to the procedure,  all risks, benefits, and alternatives were discussed and informed surgical consent was signed.  Procedure: Patient was taken to the operating room where general anesthesia was achieved. She was  positioned in dorsal lithotomy and prepped and draped per the surgical oncology team. A foley catheter was inserted into the bladder. A 5 mm skin incision was made in the left upper quadrant at Palmer's point, the abdomen was then entered with direct Optiview entry and the abdomen was insufflated. Findings were noted as above.  The laparoscope was then removed from the abdomen, the pneumoperitoneum was maintained. A vertical midline incision was made with the scalpel and the abdomen was entered sharply. The abdomen and pelvis were surveyed with findings as documented above. A wound protector was placed followed by the [facility] retractor.  The enlarged left adnexal mass was then carefully removed of its bilateral sidewall attachments using a mixture of blunt and cautery dissection. Multiple small bowel loops were adhered to the superior and posterior aspect of the mass with filmy adhesions which were carefully bluntly resected. The rectosigmoid was noted to be densely adhered to the posterior aspect of the pelvic mass. Dissection was attempted using mixture of blunt dissection as well as sharp dissection with the Metzenbaum scissors, and unavoidable enterotomy of approximately 4 cm was made to the anterior surface of the rectosigmoid. This enterotomy was then closed with 4-0 PDS in a running fashion to minimize contamination. Further dissection removing the remainder of the rectosigmoid from the posterior aspect of the pelvic mass was then undertaken. The pelvic mass was then able to be fully elevated from the pelvis. The left infundibulopelvic ligament was able to be isolated with the left ureter clearly visualized below. The left ureter was further dissected away from the medial leaf of the broad ligament, and a vessel  loop was placed. The left infundibulopelvic ligament was then isolated, clamped, and transected using the LigaSure device as well as and suture-ligated. The left utero-ovarian ligament was then isolated, clamped, cauterized and transected. The left pelvic mass was then removed entirely.  Attention was then turned to the right. An additional enlarged multicystic right adnexal mass was noted, this was carefully elevated from the pelvis using blunt dissection. The right round ligament was then grasped, elevated, and transected. The broad ligament was then opened posteriorly. The ureter was identified and the right infundibulopelvic ligament was then isolated, clamped, transected, and tied. The right utero ovarian ligament was then isolated, clamped, and transected using the LigaSure device and the right adnexal mass was removed.  Attention then was turned toward the hysterectomy. A sponge stick was placed within the vagina. The right broad ligament dissection was opened anteriorly and the bladder flap was developed. The left round ligament was then further transected, and the broad ligament opened posteriorly and anteriorly to complete the bladder flap. The right uterine artery was skeletonized, clamped, transected, and suture-ligated. There was significant parametrial tumor involvement on the left side, and therefore a radical dissection was required to isolate the blood supply. The left ureter was further dissected down to the level near the trigone. The left uterine artery was traced to its origin, isolated, and clamped with vascular clips. Similarly the left uterine vein due to significant necrotic tumor involvement was friable. Detachment was similarly carefully isolated, and clipped with vascular clips. Sequential clamps were then used to transect the remainder of the broad and cardinal ligaments bilaterally, with each pedicle being suture-ligated. A large right angle clamp was then able to be placed below  the cervix, and the uterus and cervix were amputated from the vagina. The vaginal cuff was then closed with several figure-of-eight sutures using 0 Vicryl.  02/15/2022 Pathology Results   A: Abdominal fat pad, excision - Fibroadipose tissue with residual mixed malignant germ cell tumor with treatment effect   B: Left adnexa, salpingo-oophorectomy - Residual mixed malignant germ cell tumor with treatment effect, [id number] (See Comment and Synoptic report) - FIGO stage IIIC - Fallopian tubes with no tumor present   C: Right adnexa, salpingo-oophorectomy - Residual mixed malignant germ cell tumor with treatment effect (See Synoptic report) - Fallopian tubes not identified   D: Left parametrium, biopsy - Fibroadipose tissue with residual mixed malignant germ cell tumor with treatment effect   E: Uterus and cervix, hysterectomy - Right adnexal soft tissue with residual mixed malignant germ cell tumor with treatment effect   Myometrium: - Adenomyosis - Leiomyomata measuring up to 0.7 cm - Extensive chronic serositis   Cervix:  - Ectocervix and endocervix with chronic cervicitis    Endometrium: - Inactive endometrium   F: Low anterior resection - Residual mixed malignant  germ cell tumor with treatment effect involving colonic serosa and pericolonic adipose tissue - Proximal and distal colonic resection margins with chronic serositis, but negative for tumor    G: Right peritoneum, biopsy - Fibroadipose tissue with residual mixed malignant germ cell tumor with treatment effect   H: Right retroperitoneal mass, excision - Fibroadipose tissue with residual mixed malignant germ cell tumor with treatment effect   I: Omentum, omentectomy - Fibroadipose tissue with residual mixed malignant germ cell tumor with treatment effect   J: Gallbladder, cholecystectomy - Gallbladder with mild chronic serositis and fibrous adhesion   K: Right diaphragm, excision - Fibrous tissue with residual  mixed malignant germ cell tumor with treatment effect - Hepatic tissue with no tumor present   L: Left diaphragm nodule, biopsy - Fibrous tissue with residual mixed malignant germ cell tumor with treatment effect   M: Spleen, splenectomy - Residual mixed malignant germ cell tumor with treatment effect involving splenic capsule   N: Appendix, appendectomy - Appendix with fibrous obliteration at the tip - Periappendiceal adipose tissue with acute and chronic inflammation   O: Small bowel mesenteric nodules, excision - Fibroadipose tissue with residual mixed malignant germ cell tumor with treatment effect   P: Mesenteric nodules, excision - Fibroadipose tissue with residual mixed malignant germ cell tumor with treatment effect   Q: Hepatic flexure mass, excision - Fibroadipose tissue with residual mixed malignant germ cell tumor with treatment effect   R: Anvil and donut, biopsy - Benign segment of colon with acute and chronic inflammation and fibrous adhesion  SPECIMEN    Procedure:    Radical hysterectomy     Procedure:    Omentectomy     Procedure:    Peritoneal tumor debulking     Hysterectomy Type:    Abdominal     Specimen Integrity:          Right Ovary Integrity:    Capsule intact     Specimen Integrity:          Left Ovary Integrity:    Capsule intact   TUMOR    Tumor Site:    Bilateral ovaries     Tumor Size:    Greatest Dimension (Centimeters): 19.5 cm     Histologic Type:    Mixed malignant germ cell tumor:  yolk sac tumor, mature teratoma, mature neural elements, immature neural tissue, and immature cartilage     Ovarian Surface Involvement:    Present, right and left     Other Tissue / Organ Involvement:  Right ovary     Other Tissue / Organ Involvement:    Left ovary     Other Tissue / Organ Involvement:    Right fallopian tube     Other Tissue / Organ Involvement:    Omentum     Other Tissue / Organ Involvement:    Diaphragm and splenic capsule     Largest  Extrapelvic Peritoneal Focus:    Macroscopic (greater than 2 cm): Splenic capsule     Peritoneal / Ascitic Fluid Involvement:    Not submitted / unknown   REGIONAL LYMPH NODES     Regional Lymph Node Status:    Not applicable (no regional lymph nodes submitted or found)   DISTANT METASTASIS     Distant Site(s) Involved:    Diaphragm, splenic capsule   pTNM CLASSIFICATION (AJCC 8th Edition)     Reporting of pT, pN, and (when applicable) pM categories is based on information available to the pathologist at the time the report is issued. As per the AJCC (Chapter 1, 8th Ed.) it is the managing physician's responsibility to establish the final pathologic stage based upon all pertinent information, including but potentially not limited to this pathology report.     Modified Classification:    y     pT Category:    pT3c     pN Category:    pN not assigned (no nodes submitted or found)     pM Category:    pM1   FIGO STAGE     FIGO Stage:    IIIC   COMMENT:  The needle core biopsies show a heterogeneous combination of elements including abundant neural tissue with atypical and immature features. There are also atypical glandular epithelial structures and focal yolk sac elements with microcystic pattern.  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  Immunohistochemistry is performed for cytokeratin AE1/AE3, CD117, CD30, G FAP, glypican-3, OCT3/4, synaptophysin, S100 and CD30.     03/14/2022 - 05/02/2022 Chemotherapy   Patient is on Treatment Plan : Malignant germ cell tumor of ovary D1-5 q21d     03/22/2022 Tumor Marker   Patient's tumor was tested for the following markers: AFP. Results of the tumor marker test revealed 7.9.   04/23/2022 Genetic Testing   Negative genetic testing on the CancerNext-Expanded+RNAinsight panel.  The report date is April 23, 2022.  The CancerNext-Expanded gene panel offered by [facility] and includes sequencing and rearrangement analysis for the following 77 genes: AIP, ALK, APC*, ATM*, AXIN2, BAP1, BARD1, BMPR1A, BRCA1*, BRCA2*, BRIP1*, CDC73, CDH1*, CDK4, CDKN1B, CDKN2A, CHEK2*, CTNNA1, DICER1, FH, FLCN, KIF1B, LZTR1, MAX, MEN1, MET, MLH1*, MSH2*, MSH3, MSH6*, MUTYH*, NF1*, NF2, NTHL1, PALB2*, PHOX2B, PMS2*, POT1, PRKAR1A, PTCH1, PTEN*, RAD51C*, RAD51D*, RB1, RET, SDHA, SDHAF2, SDHB, SDHC, SDHD, SMAD4, SMARCA4, SMARCB1, SMARCE1, STK11, SUFU, TMEM127, TP53*, TSC1, TSC2, and VHL (sequencing and deletion/duplication); EGFR, EGLN1, HOXB13, KIT, MITF, PDGFRA, POLD1, and POLE (sequencing only); EPCAM and GREM1 (deletion/duplication only). DNA and RNA analyses performed for * genes.    05/17/2022 Imaging   CT ABDOMEN PELVIS W WO CONTRAST  Result Date: 05/17/2022 CLINICAL DATA:  History of ovarian cancer status post recent section and completion of chemotherapy 1-1/2 weeks ago. Evaluation of possible ileostomy reversal. EXAM: CT ABDOMEN AND PELVIS WITHOUT AND WITH CONTRAST TECHNIQUE: Multidetector CT imaging of the abdomen and pelvis was performed following the standard protocol before and following the bolus administration of intravenous contrast. RADIATION DOSE REDUCTION:  This exam was performed according to the departmental dose-optimization program which includes automated exposure control, adjustment of the mA and/or kV according to patient size and/or use of iterative reconstruction technique. CONTRAST:  OMNIPAQUE IOHEXOL 300 MG/ML  SOLN COMPARISON:  CT abdomen and pelvis dated 02/09/2022 FINDINGS: Lower chest: No focal consolidation or pulmonary nodule in the lung bases. No pleural effusion or pneumothorax demonstrated. Partially imaged heart size is normal. Hepatobiliary: Postsurgical changes of the right hepatic dome with irregular capsular retraction. Punctate adjacent focus of subdiaphragmatic calcification (11:32). Unchanged subcentimeter hypoattenuating focus along posterior  segment 7 (4:18). No intra or extrahepatic biliary ductal dilation. Cholecystectomy. Pancreas: No focal lesions or main ductal dilation. Spleen: Splenectomy. Lobulated, enhancing soft tissue density along the left hemidiaphragm at the splenectomy site measures 1.5 x 1.0 cm (4:11). Adrenals/Urinary Tract: No adrenal nodules. No suspicious renal mass or hydronephrosis. Punctate nonobstructing left lower pole stones. No focal bladder wall thickening. Stomach/Bowel: Normal appearance of the stomach. Right upper quadrant ileostomy. Enteric contrast material is present within the medial limb of the ileostomy. Appendectomy. Vascular/Lymphatic: Aortic atherosclerosis. No enlarged abdominal or pelvic lymph nodes. Reproductive: Hysterectomy and bilateral salpingo-oophorectomy. Ill-defined soft tissue near the left vaginal (4:66). No adnexal masses. Other: Status post omentectomy. Ill-defined lateral peritoneal thickening, for example series 4 image 38 on the right and image 36 on the left associated with scattered foci of mesenteric nodularity, for example 7 x 5 mm (4:34). Musculoskeletal: No acute or abnormal lytic or blastic osseous lesions. Postsurgical changes of the anterior abdominal wall. IMPRESSION: 1. Extensive postsurgical changes of the abdomen and pelvis. Right upper quadrant ileostomy with enteric contrast material present within the medial limb of the ileostomy. No evidence of obstruction. 2. Multifocal ill-defined soft tissue densities as described, indeterminate, may reflect postsurgical change versus residual tumor. 3. Lobulated, enhancing soft tissue density along the left hemidiaphragm at the splenectomy site may represent residual/recurrent splenic tissue versus residual tumor, although the enhancement pattern does not appear consistent with pre-surgical appearance of the malignancy. 4.  Aortic Atherosclerosis (ICD10-I70.0). Electronically Signed   By: Agustin Cree M.D.   On: 05/17/2022 16:43       06/09/2022 Imaging   CT ABDOMEN PELVIS WO CONTRAST  Result Date: 06/09/2022 CLINICAL DATA:  Ovarian cancer, status post chemotherapy. Rectal contrast administered to evaluate for anastomotic patency prior to reversal. EXAM: CT ABDOMEN AND PELVIS WITHOUT CONTRAST TECHNIQUE: Multidetector CT imaging of the abdomen and pelvis was performed following the standard protocol without IV contrast. RADIATION DOSE REDUCTION: This exam was performed according to the departmental dose-optimization program which includes automated exposure control, adjustment of the mA and/or kV according to patient size and/or use of iterative reconstruction technique. COMPARISON:  05/17/2022 FINDINGS: Lower chest: Lung bases are clear. Hepatobiliary: Unenhanced liver is unremarkable. Status post cholecystectomy. No intrahepatic or extrahepatic ductal dilatation. Pancreas: Within normal limits. 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 Adrenals/Urinary Tract: Adrenal glands are within normal limits. Right kidney is within normal limits. 3 mm nonobstructing left lower pole renal calculus (series 2/image 22). No hydronephrosis. Bladder is within normal limits. Stomach/Bowel: Stomach is within normal limits. No evidence of bowel obstruction. 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 Status post left hemicolectomy with suture line in the lower pelvis (series 2/image 87). Rectal contrast confirms a patent anastomosis without evidence of leak. Vascular/Lymphatic: No evidence of abdominal aortic aneurysm. Atherosclerotic calcifications of the abdominal aorta and branch vessels. No suspicious abdominopelvic lymphadenopathy. Reproductive: Status post hysterectomy and suspected  bilateral salpingo oophorectomy. Other: No abdominopelvic ascites. Mild peritoneal nodularity along the lateral left mid abdomen, with discrete 8 mm nodule (series 2/image 33), more conspicuous than on the prior. Mild nonspecific mesenteric  stranding along the jejunal mesentery in central left mid abdomen (series 2/image 30). Mildly prominent soft tissue along the left pelvic cul-de-sac measuring up to 2.0 cm (series 2/image 59), grossly unchanged. Overall appearance suggests very mild peritoneal disease which is overall grossly unchanged. Musculoskeletal: Visualized osseous structures are within normal limits. 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 Rectal contrast confirms a patent anastomosis without evidence of leak. 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 Additional ancillary findings as above. Electronically Signed   By: Charline Bills M.D.   On: 06/09/2022 01:59   CT ABDOMEN PELVIS W WO CONTRAST  Result Date: 05/17/2022 CLINICAL DATA:  History of ovarian cancer status post recent section and completion of chemotherapy 1-1/2 weeks ago. Evaluation of possible ileostomy reversal. EXAM: CT ABDOMEN AND PELVIS WITHOUT AND WITH CONTRAST TECHNIQUE: Multidetector CT imaging of the abdomen and pelvis was performed following the standard protocol before and following the bolus administration of intravenous contrast. RADIATION DOSE REDUCTION: This exam was performed according to the departmental dose-optimization program which includes automated exposure control, adjustment of the mA and/or kV according to patient size and/or use of iterative reconstruction technique. CONTRAST:  OMNIPAQUE IOHEXOL 300 MG/ML  SOLN COMPARISON:  CT abdomen and pelvis dated 02/09/2022 FINDINGS: Lower chest: No focal consolidation or pulmonary nodule in the lung bases. No pleural effusion or pneumothorax demonstrated. Partially imaged heart size is normal. Hepatobiliary: Postsurgical changes of the right hepatic dome with irregular capsular retraction. Punctate adjacent focus of subdiaphragmatic calcification (11:32). Unchanged subcentimeter hypoattenuating focus along posterior  segment 7 (4:18). No intra or extrahepatic biliary ductal dilation. Cholecystectomy. Pancreas: No focal lesions or main ductal dilation. Spleen: Splenectomy. Lobulated, enhancing soft tissue density along the left hemidiaphragm at the splenectomy site measures 1.5 x 1.0 cm (4:11). Adrenals/Urinary Tract: No adrenal nodules. No suspicious renal mass or hydronephrosis. Punctate nonobstructing left lower pole stones. No focal bladder wall thickening. Stomach/Bowel: Normal appearance of the stomach. Right upper quadrant ileostomy. Enteric contrast material is present within the medial limb of the ileostomy. Appendectomy. Vascular/Lymphatic: Aortic atherosclerosis. No enlarged abdominal or pelvic lymph nodes. Reproductive: Hysterectomy and bilateral salpingo-oophorectomy. Ill-defined soft tissue near the left vaginal (4:66). No adnexal masses. Other: Status post omentectomy. Ill-defined lateral peritoneal thickening, for example series 4 image 38 on the right and image 36 on the left associated with scattered foci of mesenteric nodularity, for example 7 x 5 mm (4:34). Musculoskeletal: No acute or abnormal lytic or blastic osseous lesions. Postsurgical changes of the anterior abdominal wall. IMPRESSION: 1. Extensive postsurgical changes of the abdomen and pelvis. Right upper quadrant ileostomy with enteric contrast material present within the medial limb of the ileostomy. No evidence of obstruction. 2. Multifocal ill-defined soft tissue densities as described, indeterminate, may reflect postsurgical change versus residual tumor. 3. Lobulated, enhancing soft tissue density along the left hemidiaphragm at the splenectomy site may represent residual/recurrent splenic tissue versus residual tumor, although the enhancement pattern does not appear consistent with pre-surgical appearance of the malignancy. 4.  Aortic Atherosclerosis (ICD10-I70.0). Electronically Signed   By: Agustin Cree M.D.   On: 05/17/2022 16:43       07/01/2022 Surgery   Pre-operative Diagnosis: Ileostomy, planned reversal   Post-operative Diagnosis: same as above   Operation: Ileostomy takedown  Surgeon: Eugene Garnet MD    Operative Findings: Normal-appearing loop ileostomy with some mild retraction.  Some filmy adhesions around the loop of ileum itself and its mesentery.  Fascia easily identified and normal in appearance.  Ileum freed from surrounding anterior abdominal wall peritoneum.     08/09/2022 Tumor Marker   Patient's tumor was tested for the following markers: CA-125. Results of the tumor marker test revealed 12.6.   08/10/2022 Tumor Marker   Patient's tumor was tested for the following markers: AFP. Results of the tumor marker test revealed 3.     PHYSICAL EXAMINATION: ECOG PERFORMANCE STATUS: 0 - Asymptomatic  Vitals:   08/11/22 1032  BP: 129/75  Pulse: (!) 109  Resp: 18  Temp: 97.9 F (36.6 C)  SpO2: 100%   Filed Weights   08/11/22 1032  Weight: 112 lb 3.2 oz (50.9 kg)    GENERAL:alert, no distress and comfortable NEURO: alert & oriented x 3 with fluent speech, no focal motor/sensory deficits  LABORATORY DATA:  I have reviewed the data as listed    Component Value Date/Time   NA 141 08/08/2022 0748   K 3.9 08/08/2022 0748   CL 107 08/08/2022 0748   CO2 27 08/08/2022 0748   GLUCOSE 109 (H) 08/08/2022 0748   BUN 18 08/08/2022 0748   CREATININE 0.76 08/08/2022 0748   CREATININE 0.57 04/18/2022 1202   CREATININE 0.75 03/23/2011 1550   CALCIUM 9.5 08/08/2022 0748   PROT 6.4 (L) 08/08/2022 0748   ALBUMIN 3.9 08/08/2022 0748   AST 23 08/08/2022 0748   AST 38 04/18/2022 1202   ALT 30 08/08/2022 0748   ALT 28 04/18/2022 1202   ALKPHOS 49 08/08/2022 0748   BILITOT 0.2 (L) 08/08/2022 0748   BILITOT 0.2 (L) 04/18/2022 1202   GFRNONAA >60 08/08/2022 0748   GFRNONAA >60 04/18/2022 1202    No results found for: "SPEP", "UPEP"  Lab Results  Component Value Date   WBC 10.9 (H) 08/08/2022    NEUTROABS 6.4 08/08/2022   HGB 12.1 08/08/2022   HCT 36.7 08/08/2022   MCV 99.5 08/08/2022   PLT 439 (H) 08/08/2022      Chemistry      Component Value Date/Time   NA 141 08/08/2022 0748   K 3.9 08/08/2022 0748   CL 107 08/08/2022 0748   CO2 27 08/08/2022 0748   BUN 18 08/08/2022 0748   CREATININE 0.76 08/08/2022 0748   CREATININE 0.57 04/18/2022 1202   CREATININE 0.75 03/23/2011 1550      Component Value Date/Time   CALCIUM 9.5 08/08/2022 0748   ALKPHOS 49 08/08/2022 0748   AST 23 08/08/2022 0748   AST 38 04/18/2022 1202   ALT 30 08/08/2022 0748   ALT 28 04/18/2022 1202   BILITOT 0.2 (L) 08/08/2022 0748   BILITOT 0.2 (L) 04/18/2022 1202

## 2022-08-11 NOTE — Assessment & Plan Note (Signed)
Clinically, she has no signs or symptoms to suggest cancer recurrence We will repeat blood work and tumor marker when she returns in August for imaging studies I will see her in October

## 2022-08-11 NOTE — Assessment & Plan Note (Signed)
This is gradually improving Observe only

## 2022-08-12 ENCOUNTER — Telehealth: Payer: Self-pay | Admitting: Hematology and Oncology

## 2022-08-12 NOTE — Telephone Encounter (Signed)
Spoke with patient confirming upcoming appointments  

## 2022-09-21 ENCOUNTER — Telehealth: Payer: Self-pay | Admitting: *Deleted

## 2022-09-21 NOTE — Telephone Encounter (Signed)
Collaborative reports patient needs form completed within next seven days.   Met with Baron Hamper, received paperwork, signed [employer] and [employer].   Requires return call when completed.  Urgent request.

## 2022-09-29 NOTE — Telephone Encounter (Signed)
[employer] form completed by this nurse, signed by provider, returned to [employer] via fax and e-mailed to [facility] Benefits Team member and patient.  Connected with Phenicia Bennette Catlin [phone]) at this time, received confirmation of receipt and she will pick up form on next visit. Form in envelope in [facility] file folder ready for pick-up.[name]

## 2022-10-06 ENCOUNTER — Ambulatory Visit (HOSPITAL_COMMUNITY)
Admission: RE | Admit: 2022-10-06 | Discharge: 2022-10-06 | Disposition: A | Discharge status: Home / Self Care | Payer: 59 | Source: Ambulatory Visit | Attending: Gynecologic Oncology | Admitting: Gynecologic Oncology

## 2022-10-06 ENCOUNTER — Inpatient Hospital Stay: Payer: 59 | Attending: Gynecologic Oncology

## 2022-10-06 DIAGNOSIS — N951 Menopausal and female climacteric states: Secondary | ICD-10-CM | POA: Insufficient documentation

## 2022-10-06 DIAGNOSIS — Z9221 Personal history of antineoplastic chemotherapy: Secondary | ICD-10-CM | POA: Insufficient documentation

## 2022-10-06 DIAGNOSIS — Z90722 Acquired absence of ovaries, bilateral: Secondary | ICD-10-CM | POA: Insufficient documentation

## 2022-10-06 DIAGNOSIS — C569 Malignant neoplasm of unspecified ovary: Secondary | ICD-10-CM

## 2022-10-06 DIAGNOSIS — Z8543 Personal history of malignant neoplasm of ovary: Secondary | ICD-10-CM | POA: Insufficient documentation

## 2022-10-06 DIAGNOSIS — N941 Unspecified dyspareunia: Secondary | ICD-10-CM | POA: Insufficient documentation

## 2022-10-06 DIAGNOSIS — Z90710 Acquired absence of both cervix and uterus: Secondary | ICD-10-CM | POA: Insufficient documentation

## 2022-10-06 LAB — CBC WITH DIFFERENTIAL/PLATELET
Abs Immature Granulocytes: 0.02 10*3/uL (ref 0.00–0.07)
Basophils Absolute: 0.1 10*3/uL (ref 0.0–0.1)
Basophils Relative: 1 %
Eosinophils Absolute: 0.1 10*3/uL (ref 0.0–0.5)
Eosinophils Relative: 1 %
HCT: 40.1 % (ref 36.0–46.0)
Hemoglobin: 13.6 g/dL (ref 12.0–15.0)
Immature Granulocytes: 0 %
Lymphocytes Relative: 18 %
Lymphs Abs: 1.8 10*3/uL (ref 0.7–4.0)
MCH: 32.2 pg (ref 26.0–34.0)
MCHC: 33.9 g/dL (ref 30.0–36.0)
MCV: 94.8 fL (ref 80.0–100.0)
Monocytes Absolute: 0.9 10*3/uL (ref 0.1–1.0)
Monocytes Relative: 9 %
Neutro Abs: 7.2 10*3/uL (ref 1.7–7.7)
Neutrophils Relative %: 71 %
Platelets: 336 10*3/uL (ref 150–400)
RBC: 4.23 MIL/uL (ref 3.87–5.11)
RDW: 13.2 % (ref 11.5–15.5)
WBC: 10 10*3/uL (ref 4.0–10.5)
nRBC: 0 % (ref 0.0–0.2)

## 2022-10-06 LAB — COMPREHENSIVE METABOLIC PANEL
ALT: 27 U/L (ref 0–44)
AST: 20 U/L (ref 15–41)
Albumin: 4.6 g/dL (ref 3.5–5.0)
Alkaline Phosphatase: 60 U/L (ref 38–126)
Anion gap: 9 (ref 5–15)
BUN: 14 mg/dL (ref 6–20)
CO2: 27 mmol/L (ref 22–32)
Calcium: 9.8 mg/dL (ref 8.9–10.3)
Chloride: 104 mmol/L (ref 98–111)
Creatinine, Ser: 0.72 mg/dL (ref 0.44–1.00)
GFR, Estimated: >60 mL/min (ref >60)
Glucose, Bld: 108 mg/dL — ABNORMAL HIGH (ref 70–99)
Potassium: 4 mmol/L (ref 3.5–5.1)
Sodium: 140 mmol/L (ref 135–145)
Total Bilirubin: 0.5 mg/dL (ref 0.3–1.2)
Total Protein: 7.5 g/dL (ref 6.5–8.1)

## 2022-10-06 MED ORDER — IOHEXOL 300 MG/ML  SOLN
100.0000 mL | Freq: Once | INTRAMUSCULAR | Status: AC | PRN
  Administered 2022-10-06: 100 mL via INTRAVENOUS

## 2022-10-06 MED ORDER — HEPARIN SOD (PORK) LOCK FLUSH 100 UNIT/ML IV SOLN
500.0000 [IU] | Freq: Once | INTRAVENOUS | Status: AC
  Administered 2022-10-06: 500 [IU] via INTRAVENOUS

## 2022-10-06 MED ORDER — IOHEXOL 9 MG/ML PO SOLN
1000.0000 mL | Freq: Once | ORAL | Status: AC
  Administered 2022-10-06: 1000 mL via ORAL

## 2022-10-06 MED ORDER — IOHEXOL 9 MG/ML PO SOLN
ORAL | Status: AC
  Filled 2022-10-06: qty 1000

## 2022-10-06 MED ORDER — SODIUM CHLORIDE 0.9% FLUSH
10.0000 mL | Freq: Once | INTRAVENOUS | Status: AC
  Administered 2022-10-06: 10 mL

## 2022-10-06 MED ORDER — HEPARIN SOD (PORK) LOCK FLUSH 100 UNIT/ML IV SOLN
INTRAVENOUS | Status: AC
  Filled 2022-10-06: qty 5

## 2022-10-11 ENCOUNTER — Encounter: Payer: Self-pay | Admitting: Gynecologic Oncology

## 2022-10-14 ENCOUNTER — Encounter: Payer: Self-pay | Admitting: Gynecologic Oncology

## 2022-10-14 ENCOUNTER — Inpatient Hospital Stay (HOSPITAL_BASED_OUTPATIENT_CLINIC_OR_DEPARTMENT_OTHER): Payer: 59 | Admitting: Gynecologic Oncology

## 2022-10-14 ENCOUNTER — Inpatient Hospital Stay: Payer: 59

## 2022-10-14 ENCOUNTER — Encounter: Payer: Self-pay | Admitting: Oncology

## 2022-10-14 VITALS — BP 135/70 | HR 97 | Temp 98.5°F | Resp 19 | Wt 118.0 lb

## 2022-10-14 DIAGNOSIS — N951 Menopausal and female climacteric states: Secondary | ICD-10-CM | POA: Diagnosis not present

## 2022-10-14 DIAGNOSIS — Z8543 Personal history of malignant neoplasm of ovary: Secondary | ICD-10-CM

## 2022-10-14 DIAGNOSIS — Z9221 Personal history of antineoplastic chemotherapy: Secondary | ICD-10-CM | POA: Diagnosis not present

## 2022-10-14 DIAGNOSIS — C569 Malignant neoplasm of unspecified ovary: Secondary | ICD-10-CM

## 2022-10-14 DIAGNOSIS — Z90710 Acquired absence of both cervix and uterus: Secondary | ICD-10-CM | POA: Diagnosis not present

## 2022-10-14 DIAGNOSIS — N941 Unspecified dyspareunia: Secondary | ICD-10-CM | POA: Diagnosis not present

## 2022-10-14 DIAGNOSIS — Z90722 Acquired absence of ovaries, bilateral: Secondary | ICD-10-CM | POA: Diagnosis not present

## 2022-10-14 NOTE — Progress Notes (Signed)
Requested ER testing on accession [id number] with [facility] via email.

## 2022-10-14 NOTE — Patient Instructions (Signed)
It was good to see you today.  I do not see or feel any evidence of cancer recurrence on your exam.  I will let you know when I get your tumor markers back.  I will plan to see you in December around the time of your next scan.  Please let me know if you develop any new or concerning symptoms before then.  Clydie Braun is reaching out to pathology to add estrogen receptor testing to your tumor.  Please give her a call if you have not heard anything from either myself or Clydie Braun in about 2 weeks.

## 2022-10-14 NOTE — Progress Notes (Signed)
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 21.1 by 10.9 by 18.7 cm abdominopelvic mass, with a satellite mass in the right upper quadrant anteriorly, and moderate ascites with tumor deposits along the pelvic ascites posteriorly. The mass could be arising from right or left ovary, or both. The mass displaces the bowel and IVC, but there is no IVC thrombus or pelvic DVT identified. 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 2. Additional satellite mass above the liver is partially included, along with tumor deposits along the ascites. 3. Low-level edema along the lateral abdominal wall musculature. 4. Flattening of the IVC at the level of the aortic bifurcation due to the large abdominopelvic tumor. No findings of pelvic DVT.     12/07/2021 Imaging   CT abdomen and pelvis 1. Large central abdominal and pelvic mass consistent with known malignancy. This is incompletely visualized by this CT of the abdomen, but was seen on recent pelvic MRI. 2. 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 Right omental implant consistent with metastatic disease. Small amount of abdominal ascites. 3. No evidence for bowel or ureteral obstruction. 4. Aortic Atherosclerosis (ICD10-I70.0). 5. Chest findings dictated separately.   12/07/2021 Imaging   CT chest 1. Negative for pulmonary embolism. And no acute or metastatic process identified in the Chest. 2. Partially visible liver mass and ascites, staging CT Abdomen today is reported separately.     12/07/2021 Procedure   Successful placement  of a right internal jugular approach power injectable Port-A-Cath. The catheter is ready for immediate use.   12/07/2021 Procedure   Ultrasound-guided biopsy of right upper quadrant mass.    12/08/2021 Initial Diagnosis   Ovarian cancer (HCC)   12/08/2021 Cancer Staging   Staging form: Ovary, Fallopian Tube, and Primary Peritoneal Carcinoma, AJCC 8th Edition - Clinical stage from 12/08/2021: FIGO Stage IIIC (cT3c, cN0, cM0) - Signed by [name], MD on 12/10/2021 Stage prefix: Initial diagnosis   12/20/2021 - 12/28/2021 Chemotherapy   Patient is on Treatment Plan : Ovarian germ cell tumor q21d x 3 Cycles     01/03/2022 Imaging   IMPRESSION: 1. Large central abdominopelvic mass compatible with malignancy, 3450 cubic cm in volume. This may be arising from the ovaries with slight eccentricity to the right in the pelvis. 2. Mild increase in size of the right omental tumor deposit. 3. Mild increase in size of the subcapsular mass along the dome of the right hepatic lobe, with suspected hepatic invasion. 4. Scattered malignant ascites. 5. No dilated bowel to suggest obstruction. 6. 2 mm left kidney lower pole nonobstructive renal calculus. 7. Mild abdominal aortic atherosclerotic vascular disease.   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  Postoperative Diagnosis: Status post R0 cytoreductive surgery and heating intraperitoneal chemotherapy  Procedures: LAPAROSCOPY, ABDOMEN, PERITONEUM, & OMENTUM, DIAGNOSTIC, W/WO COLLECTION SPECIMEN(S) BY BRUSHING OR WASHING  EXCISION/DESTRUCTION, OPEN, INTRA-ABD TUMOR/CYST/ENDOMETRIOMA, 1+ PERITONEAL/MESENTERI/RETROPERIT 5CM OR <COLECTOMY, PARTIAL; WITH ANASTOMOSIS COLOSTOMY OR SKIN LEVEL CECOSTOMY SPLENECTOMY; TOT (SEPART PROC) EXPLORATORY LAPAROTOMY, EXPLORATORY CELIOTOMY WITH OR WITHOUT BIOPSY(S) HYPERTHERMIA, EXTERNALLY  GENERATED; DEEP CHEMOTHERAPY  ADMINISTRATION INTO THE PERITONEAL CAVITY VIA INDWELLING PORT OR CATHETER RESECTION (INITIAL) OVARIAN, TUBAL/PRIM PERITONEAL MALIG W/BIL S&O/OMENTECT; W/RAD DISSECTION FOR DEBULKING CHOLECYSTECTOMY APPENDECTOMY OMENTECTOMY, EPIPLOECTOMY, RESECTION OF OMENTUM Procedures: Exploratory laparotomy, total abdominal hysterectomy with radical dissection, bilateral ureterolysis, bilateral salpingo-oophorectomy, posterior cul-de-sac peritonectomy, tumor debulking by Dr Pricilla Holm with Gynecology Oncology Dr Vanessa Ralphs with surgical oncology: Diagnostic laparoscopy, exploratory laparotomy, low anterior resection with primary end-to-end anastomosis, enterolysis, cholecystectomy, tumor capsule tumor debulking, splenectomy, appendectomy, omentectomy, diverting loop ileostomy formation  Surgeon: Eugene Garnet, MD  Findings: On diagnostic laparoscopy, only able to clearly visualize the left upper quadrant. Adhesions of bowel in the right upper quadrant limiting survey, minimal ascites noted in the pelvis, scattered tumor plaques visualized on left diaphragm. Given these findings, the decision was made to convert to exploratory laparotomy. Multiple loops of small bowel encased in a Cancn like structure in the midline with multiple filmy adhesions tethering to the superior aspect of the a large pelvic mass. Multicystic and necrotic pelvic mass originating from left ovary, approximately 18 x 20 cm incorporated within the left broad ligament with the left tube draped overlying the left ovarian mass. Bilateral tubes normal in appearance, normal-appearing uterus with smooth serosa. Right adnexa similarly with approximate 10 x 8 multicystic and necrotic mass. Diffuse tumor plaques encompassing posterior cul-de-sac peritoneum. Rectosigmoid densely adhered to posterior aspect of left adnexal mass requiring dissection and ultimately resection. Scattered treated subcentimeter nodules along small bowel mesentery. 5 x 5 cm necrotic and  multicystic tumor at the level of the hepatic flexure. Enlarged similar multicystic necrotic tumor adherent between the right diaphragm and the liver without any evidence of liver invasion and tumor encased solely within the liver capsule. Multiple tumor nodules along the spleen requiring splenectomy. Omentum overall fairly normal in appearance with only scattered small tumor deposits, no omental cake was encountered. Please see surgical oncology operative note for further details regarding operative findings for their portion of the procedure.  Specimens: ID Type Source Tests Collected by Time Destination 1 : Abdominal fat pad Tissue Abdomen SURGICAL PATHOLOGY EXAM Marion Downer, MD 02/15/2022 [phone] 2 : Left adnexa Tissue Abdomen SURGICAL PATHOLOGY EXAM Marion Downer, MD 02/15/2022 [phone] 3 : right adnexa Tissue Abdomen SURGICAL PATHOLOGY EXAM Marion Downer, MD 02/15/2022 [phone] : Left perimetrium Tissue Uterus SURGICAL PATHOLOGY EXAM Carver Fila, MD 02/15/2022 [phone] : Uterus and cervix Tissue Uterus SURGICAL PATHOLOGY EXAM Marion Downer, MD 02/15/2022 1018 6 : Low Anterior Resection Tissue Abdomen SURGICAL PATHOLOGY EXAM Marion Downer, MD 02/15/2022 1055 7 : Right peritoneum Tissue Abdomen SURGICAL PATHOLOGY EXAM Marion Downer, MD 02/15/2022 1058 8 : Right retroperitoneal mass Tissue Abdomen SURGICAL PATHOLOGY EXAM Marion Downer, MD 02/15/2022 1101 9 : Omentum Tissue Omentum SURGICAL PATHOLOGY EXAM Marion Downer, MD 02/15/2022 1130 10 : Gallbladder Tissue Gallbladder SURGICAL PATHOLOGY EXAM Marion Downer, MD 02/15/2022 1143 11 : Right diaphragm Tissue Diaphragm SURGICAL PATHOLOGY EXAM Marion Downer, MD 02/15/2022 1143 12 : Left diaphragm nodule Tissue Diaphragm SURGICAL PATHOLOGY EXAM Marion Downer, MD 02/15/2022 1157 13 : Spleen Tissue Spleen SURGICAL PATHOLOGY EXAM Marion Downer, MD  02/15/2022 1204 14 : Appendix Tissue Appendix SURGICAL PATHOLOGY EXAM Marion Downer, MD 02/15/2022 1213 15 : Small bowel mesenteric nodules Tissue Small Bowel SURGICAL PATHOLOGY EXAM Marion Downer, MD 02/15/2022 1215 16 : Colon mesenteric nodule Tissue Colon SURGICAL PATHOLOGY EXAM Marion Downer, MD 02/15/2022 1221 17 : Hepatic flexure mass Tissue Liver  SURGICAL PATHOLOGY EXAM Marion Downer, MD 02/15/2022 1227  Complications: None  Indications for Procedure: Luwanda Pride is a 52 y.o. 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 Prior to the procedure, all risks, benefits, and alternatives were discussed and informed surgical consent was signed.  Procedure: Patient was taken to the operating room where general anesthesia was achieved. She was positioned in dorsal lithotomy and prepped and draped per the surgical oncology team. A foley catheter was inserted into the bladder. A 5 mm skin incision was made in the left upper quadrant at Palmer's point, the abdomen was then entered with direct Optiview entry and the abdomen was insufflated. Findings were noted as above.  The laparoscope was then removed from the abdomen, the pneumoperitoneum was maintained. A vertical midline incision was made with the scalpel and the abdomen was entered sharply. The abdomen and pelvis were surveyed with findings as documented above. A wound protector was placed followed by the [facility] retractor.  The enlarged left adnexal mass was then carefully removed of its bilateral sidewall attachments using a mixture of blunt and cautery dissection. Multiple small bowel loops were adhered to the superior and posterior aspect of the mass with filmy adhesions which were carefully bluntly resected. The rectosigmoid was noted to be densely adhered to the posterior aspect of the pelvic mass. Dissection was attempted  using mixture of blunt dissection as well as sharp dissection with the Metzenbaum scissors, and unavoidable enterotomy of approximately 4 cm was made to the anterior surface of the rectosigmoid. This enterotomy was then closed with 4-0 PDS in a running fashion to minimize contamination. Further dissection removing the remainder of the rectosigmoid from the posterior aspect of the pelvic mass was then undertaken. The pelvic mass was then able to be fully elevated from the pelvis. The left infundibulopelvic ligament was able to be isolated with the left ureter clearly visualized below. The left ureter was further dissected away from the medial leaf of the broad ligament, and a vessel loop was placed. The left infundibulopelvic ligament was then isolated, clamped, and transected using the LigaSure device as well as and suture-ligated. The left utero-ovarian ligament was then isolated, clamped, cauterized and transected. The left pelvic mass was then removed entirely.  Attention was then turned to the right. An additional enlarged multicystic right adnexal mass was noted, this was carefully elevated from the pelvis using blunt dissection. The right round ligament was then grasped, elevated, and transected. The broad ligament was then opened posteriorly. The ureter was identified and the right infundibulopelvic ligament was then isolated, clamped, transected, and tied. The right utero ovarian ligament was then isolated, clamped, and transected using the LigaSure device and the right adnexal mass was removed.  Attention then was turned toward the hysterectomy. A sponge stick was placed within the vagina. The right broad ligament dissection was opened anteriorly and the bladder flap was developed. The left round ligament was then further transected, and the broad ligament opened posteriorly and anteriorly to complete the bladder flap. The right uterine artery was skeletonized, clamped, transected, and suture-ligated.  There was significant parametrial tumor involvement on the left side, and therefore a radical dissection was required to isolate the blood supply. The left ureter was further dissected down to the level near the trigone. The left uterine artery was traced to its origin, isolated, and clamped with vascular clips. Similarly the left uterine vein due  to significant necrotic tumor involvement was friable. Detachment was similarly carefully isolated, and clipped with vascular clips. Sequential clamps were then used to transect the remainder of the broad and cardinal ligaments bilaterally, with each pedicle being suture-ligated. A large right angle clamp was then able to be placed below the cervix, and the uterus and cervix were amputated from the vagina. The vaginal cuff was then closed with several figure-of-eight sutures using 0 Vicryl.    02/15/2022 Pathology Results   A: Abdominal fat pad, excision - Fibroadipose tissue with residual mixed malignant germ cell tumor with treatment effect   B: Left adnexa, salpingo-oophorectomy - Residual mixed malignant germ cell tumor with treatment effect, [id number] (See Comment and Synoptic report) - FIGO stage IIIC - Fallopian tubes with no tumor present   C: Right adnexa, salpingo-oophorectomy - Residual mixed malignant germ cell tumor with treatment effect (See Synoptic report) - Fallopian tubes not identified   D: Left parametrium, biopsy - Fibroadipose tissue with residual mixed malignant germ cell tumor with treatment effect   E: Uterus and cervix, hysterectomy - Right adnexal soft tissue with residual mixed malignant germ cell tumor with treatment effect   Myometrium: - Adenomyosis - Leiomyomata measuring up to 0.7 cm - Extensive chronic serositis   Cervix:  - Ectocervix and endocervix with chronic cervicitis    Endometrium: - Inactive endometrium   F: Low anterior resection - Residual mixed malignant  germ cell tumor with treatment effect  involving colonic serosa and pericolonic adipose tissue - Proximal and distal colonic resection margins with chronic serositis, but negative for tumor    G: Right peritoneum, biopsy - Fibroadipose tissue with residual mixed malignant germ cell tumor with treatment effect   H: Right retroperitoneal mass, excision - Fibroadipose tissue with residual mixed malignant germ cell tumor with treatment effect   I: Omentum, omentectomy - Fibroadipose tissue with residual mixed malignant germ cell tumor with treatment effect   J: Gallbladder, cholecystectomy - Gallbladder with mild chronic serositis and fibrous adhesion   K: Right diaphragm, excision - Fibrous tissue with residual mixed malignant germ cell tumor with treatment effect - Hepatic tissue with no tumor present   L: Left diaphragm nodule, biopsy - Fibrous tissue with residual mixed malignant germ cell tumor with treatment effect   M: Spleen, splenectomy - Residual mixed malignant germ cell tumor with treatment effect involving splenic capsule   N: Appendix, appendectomy - Appendix with fibrous obliteration at the tip - Periappendiceal adipose tissue with acute and chronic inflammation   O: Small bowel mesenteric nodules, excision - Fibroadipose tissue with residual mixed malignant germ cell tumor with treatment effect   P: Mesenteric nodules, excision - Fibroadipose tissue with residual mixed malignant germ cell tumor with treatment effect   Q: Hepatic flexure mass, excision - Fibroadipose tissue with residual mixed malignant germ cell tumor with treatment effect   R: Anvil and donut, biopsy - Benign segment of colon with acute and chronic inflammation and fibrous adhesion  SPECIMEN    Procedure:    Radical hysterectomy     Procedure:    Omentectomy     Procedure:    Peritoneal tumor debulking     Hysterectomy Type:    Abdominal     Specimen Integrity:          Right Ovary Integrity:    Capsule intact     Specimen  Integrity:          Left Ovary Integrity:    Capsule intact  TUMOR    Tumor Site:    Bilateral ovaries     Tumor Size:    Greatest Dimension (Centimeters): 19.5 cm     Histologic Type:    Mixed malignant germ cell tumor:  yolk sac tumor, mature teratoma, mature neural elements, immature neural tissue, and immature cartilage     Ovarian Surface Involvement:    Present, right and left     Other Tissue / Organ Involvement:    Right ovary     Other Tissue / Organ Involvement:    Left ovary     Other Tissue / Organ Involvement:    Right fallopian tube     Other Tissue / Organ Involvement:    Omentum     Other Tissue / Organ Involvement:    Diaphragm and splenic capsule     Largest Extrapelvic Peritoneal Focus:    Macroscopic (greater than 2 cm): Splenic capsule     Peritoneal / Ascitic Fluid Involvement:    Not submitted / unknown   REGIONAL LYMPH NODES     Regional Lymph Node Status:    Not applicable (no regional lymph nodes submitted or found)   DISTANT METASTASIS     Distant Site(s) Involved:    Diaphragm, splenic capsule   pTNM CLASSIFICATION (AJCC 8th Edition)     Reporting of pT, pN, and (when applicable) pM categories is based on information available to the pathologist at the time the report is issued. As per the AJCC (Chapter 1, 8th Ed.) it is the managing physician's responsibility to establish the final pathologic stage based upon all pertinent information, including but potentially not limited to this pathology report.     Modified Classification:    y     pT Category:    pT3c     pN Category:    pN not assigned (no nodes submitted or found)     pM Category:    pM1   FIGO STAGE     FIGO Stage:    IIIC   COMMENT:  The needle core biopsies show a heterogeneous combination of elements including abundant neural tissue with atypical and immature features. There are also atypical glandular epithelial structures and focal yolk sac elements with microcystic pattern.  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  Immunohistochemistry is performed for cytokeratin AE1/AE3, CD117, CD30, G FAP, glypican-3, OCT3/4, synaptophysin, S100 and CD30.     03/14/2022 - 05/02/2022 Chemotherapy   Patient is on Treatment Plan : Malignant germ cell tumor of ovary D1-5 q21d     03/22/2022 Tumor Marker   Patient's tumor was tested for the following markers: AFP. Results of the tumor marker test revealed 7.9.   04/23/2022 Genetic Testing   Negative genetic testing on the CancerNext-Expanded+RNAinsight panel.  The report date is April 23, 2022.  The CancerNext-Expanded gene panel offered by [facility] and includes sequencing and rearrangement analysis for the following 77 genes: AIP, ALK, APC*, ATM*, AXIN2, BAP1, BARD1, BMPR1A, BRCA1*, BRCA2*, BRIP1*, CDC73, CDH1*, CDK4, CDKN1B, CDKN2A, CHEK2*, CTNNA1, DICER1, FH, FLCN, KIF1B, LZTR1, MAX, MEN1, MET, MLH1*, MSH2*, MSH3, MSH6*, MUTYH*, NF1*, NF2, NTHL1, PALB2*, PHOX2B, PMS2*, POT1, PRKAR1A, PTCH1, PTEN*, RAD51C*, RAD51D*, RB1, RET, SDHA, SDHAF2, SDHB, SDHC, SDHD, SMAD4, SMARCA4, SMARCB1, SMARCE1, STK11, SUFU, TMEM127, TP53*, TSC1, TSC2, and VHL (sequencing and deletion/duplication); EGFR, EGLN1, HOXB13, KIT, MITF, PDGFRA, POLD1, and POLE (sequencing only); EPCAM and GREM1 (deletion/duplication only). DNA and RNA analyses performed  for * genes.    05/17/2022 Imaging   CT ABDOMEN PELVIS W WO CONTRAST  Result Date: 05/17/2022 CLINICAL DATA:  History of ovarian cancer status post recent section and completion of chemotherapy 1-1/2 weeks ago. Evaluation of possible ileostomy reversal. EXAM: CT ABDOMEN AND PELVIS WITHOUT AND WITH CONTRAST TECHNIQUE: Multidetector CT imaging of the abdomen and pelvis was performed following the standard protocol before and following the bolus administration of intravenous contrast. RADIATION DOSE REDUCTION: This exam was performed according to the  departmental dose-optimization program which includes automated exposure control, adjustment of the mA and/or kV according to patient size and/or use of iterative reconstruction technique. CONTRAST:  OMNIPAQUE IOHEXOL 300 MG/ML  SOLN COMPARISON:  CT abdomen and pelvis dated 02/09/2022 FINDINGS: Lower chest: No focal consolidation or pulmonary nodule in the lung bases. No pleural effusion or pneumothorax demonstrated. Partially imaged heart size is normal. Hepatobiliary: Postsurgical changes of the right hepatic dome with irregular capsular retraction. Punctate adjacent focus of subdiaphragmatic calcification (11:32). Unchanged subcentimeter hypoattenuating focus along posterior segment 7 (4:18). No intra or extrahepatic biliary ductal dilation. Cholecystectomy. Pancreas: No focal lesions or main ductal dilation. Spleen: Splenectomy. Lobulated, enhancing soft tissue density along the left hemidiaphragm at the splenectomy site measures 1.5 x 1.0 cm (4:11). Adrenals/Urinary Tract: No adrenal nodules. No suspicious renal mass or hydronephrosis. Punctate nonobstructing left lower pole stones. No focal bladder wall thickening. Stomach/Bowel: Normal appearance of the stomach. Right upper quadrant ileostomy. Enteric contrast material is present within the medial limb of the ileostomy. Appendectomy. Vascular/Lymphatic: Aortic atherosclerosis. No enlarged abdominal or pelvic lymph nodes. Reproductive: Hysterectomy and bilateral salpingo-oophorectomy. Ill-defined soft tissue near the left vaginal (4:66). No adnexal masses. Other: Status post omentectomy. Ill-defined lateral peritoneal thickening, for example series 4 image 38 on the right and image 36 on the left associated with scattered foci of mesenteric nodularity, for example 7 x 5 mm (4:34). Musculoskeletal: No acute or abnormal lytic or blastic osseous lesions. Postsurgical changes of the anterior abdominal wall. IMPRESSION: 1. Extensive postsurgical changes of  the abdomen and pelvis. Right upper quadrant ileostomy with enteric contrast material present within the medial limb of the ileostomy. No evidence of obstruction. 2. Multifocal ill-defined soft tissue densities as described, indeterminate, may reflect postsurgical change versus residual tumor. 3. Lobulated, enhancing soft tissue density along the left hemidiaphragm at the splenectomy site may represent residual/recurrent splenic tissue versus residual tumor, although the enhancement pattern does not appear consistent with pre-surgical appearance of the malignancy. 4.  Aortic Atherosclerosis (ICD10-I70.0). Electronically Signed   By: Agustin Cree M.D.   On: 05/17/2022 16:43      06/09/2022 Imaging   CT ABDOMEN PELVIS WO CONTRAST  Result Date: 06/09/2022 CLINICAL DATA:  Ovarian cancer, status post chemotherapy. Rectal contrast administered to evaluate for anastomotic patency prior to reversal. EXAM: CT ABDOMEN AND PELVIS WITHOUT CONTRAST TECHNIQUE: Multidetector CT imaging of the abdomen and pelvis was performed following the standard protocol without IV contrast. RADIATION DOSE REDUCTION: This exam was performed according to the departmental dose-optimization program which includes automated exposure control, adjustment of the mA and/or kV according to patient size and/or use of iterative reconstruction technique. COMPARISON:  05/17/2022 FINDINGS: Lower chest: Lung bases are clear. Hepatobiliary: Unenhanced liver is unremarkable. Status post cholecystectomy. No intrahepatic or extrahepatic ductal dilatation. Pancreas: Within normal limits. 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 Adrenals/Urinary Tract: Adrenal glands are within normal limits. Right kidney is within normal limits. 3 mm nonobstructing left lower pole renal  calculus (series 2/image 22). No hydronephrosis. Bladder is within normal limits. Stomach/Bowel: Stomach is within normal limits. No evidence of bowel obstruction.  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 Status post left hemicolectomy with suture line in the lower pelvis (series 2/image 87). Rectal contrast confirms a patent anastomosis without evidence of leak. Vascular/Lymphatic: No evidence of abdominal aortic aneurysm. Atherosclerotic calcifications of the abdominal aorta and branch vessels. No suspicious abdominopelvic lymphadenopathy. Reproductive: Status post hysterectomy and suspected bilateral salpingo oophorectomy. Other: No abdominopelvic ascites. Mild peritoneal nodularity along the lateral left mid abdomen, with discrete 8 mm nodule (series 2/image 33), more conspicuous than on the prior. Mild nonspecific mesenteric stranding along the jejunal mesentery in central left mid abdomen (series 2/image 30). Mildly prominent soft tissue along the left pelvic cul-de-sac measuring up to 2.0 cm (series 2/image 59), grossly unchanged. Overall appearance suggests very mild peritoneal disease which is overall grossly unchanged. Musculoskeletal: Visualized osseous structures are within normal limits. 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 Rectal contrast confirms a patent anastomosis without evidence of leak. 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 Additional ancillary findings as above. Electronically Signed   By: Charline Bills M.D.   On: 06/09/2022 01:59   CT ABDOMEN PELVIS W WO CONTRAST  Result Date: 05/17/2022 CLINICAL DATA:  History of ovarian cancer status post recent section and completion of chemotherapy 1-1/2 weeks ago. Evaluation of possible ileostomy reversal. EXAM: CT ABDOMEN AND PELVIS WITHOUT AND WITH CONTRAST TECHNIQUE: Multidetector CT imaging of the abdomen and pelvis was performed following the standard protocol before and following the bolus administration of intravenous contrast. RADIATION DOSE REDUCTION: This exam was performed according to the  departmental dose-optimization program which includes automated exposure control, adjustment of the mA and/or kV according to patient size and/or use of iterative reconstruction technique. CONTRAST:  OMNIPAQUE IOHEXOL 300 MG/ML  SOLN COMPARISON:  CT abdomen and pelvis dated 02/09/2022 FINDINGS: Lower chest: No focal consolidation or pulmonary nodule in the lung bases. No pleural effusion or pneumothorax demonstrated. Partially imaged heart size is normal. Hepatobiliary: Postsurgical changes of the right hepatic dome with irregular capsular retraction. Punctate adjacent focus of subdiaphragmatic calcification (11:32). Unchanged subcentimeter hypoattenuating focus along posterior segment 7 (4:18). No intra or extrahepatic biliary ductal dilation. Cholecystectomy. Pancreas: No focal lesions or main ductal dilation. Spleen: Splenectomy. Lobulated, enhancing soft tissue density along the left hemidiaphragm at the splenectomy site measures 1.5 x 1.0 cm (4:11). Adrenals/Urinary Tract: No adrenal nodules. No suspicious renal mass or hydronephrosis. Punctate nonobstructing left lower pole stones. No focal bladder wall thickening. Stomach/Bowel: Normal appearance of the stomach. Right upper quadrant ileostomy. Enteric contrast material is present within the medial limb of the ileostomy. Appendectomy. Vascular/Lymphatic: Aortic atherosclerosis. No enlarged abdominal or pelvic lymph nodes. Reproductive: Hysterectomy and bilateral salpingo-oophorectomy. Ill-defined soft tissue near the left vaginal (4:66). No adnexal masses. Other: Status post omentectomy. Ill-defined lateral peritoneal thickening, for example series 4 image 38 on the right and image 36 on the left associated with scattered foci of mesenteric nodularity, for example 7 x 5 mm (4:34). Musculoskeletal: No acute or abnormal lytic or blastic osseous lesions. Postsurgical changes of the anterior abdominal wall. IMPRESSION: 1. Extensive postsurgical changes of  the abdomen and pelvis. Right upper quadrant ileostomy with enteric contrast material present within the medial limb of the ileostomy. No evidence of obstruction. 2. Multifocal ill-defined soft tissue densities as described, indeterminate, may reflect postsurgical change versus residual tumor. 3. Lobulated, enhancing  soft tissue density along the left hemidiaphragm at the splenectomy site may represent residual/recurrent splenic tissue versus residual tumor, although the enhancement pattern does not appear consistent with pre-surgical appearance of the malignancy. 4.  Aortic Atherosclerosis (ICD10-I70.0). Electronically Signed   By: Agustin Cree M.D.   On: 05/17/2022 16:43      07/01/2022 Surgery   Pre-operative Diagnosis: Ileostomy, planned reversal   Post-operative Diagnosis: same as above   Operation: Ileostomy takedown   Surgeon: Eugene Garnet MD    Operative Findings: Normal-appearing loop ileostomy with some mild retraction.  Some filmy adhesions around the loop of ileum itself and its mesentery.  Fascia easily identified and normal in appearance.  Ileum freed from surrounding anterior abdominal wall peritoneum.     08/09/2022 Tumor Marker   Patient's tumor was tested for the following markers: CA-125. Results of the tumor marker test revealed 12.6.   08/10/2022 Tumor Marker   Patient's tumor was tested for the following markers: AFP. Results of the tumor marker test revealed 3.     Interval History: Doing well.  Denies any abdominal or pelvic pain.  Endorses having some hot flashes, dyspareunia.  Bowel function continues to change.  Diarrhea has resolved but sometimes will have episodes of constipation and other days have 4-6 well-formed stools.  Denies urinary symptoms.  Denies any vaginal bleeding.  Planning to return to work in October.  Past Medical/Surgical History: Past Medical History:  Diagnosis Date   Arthritis    Endometriosis    Family history of breast cancer     Family history of breast cancer in mother    at age 65   GAD (generalized anxiety disorder)    lexapro helped 2017 (Dr. Emmit Alexanders did have extra stress of the Master's degree program on her at that time.  Got counseling.       GERD (gastroesophageal reflux disease)    Gestational diabetes    History of kidney stones    Low back pain    Dr. Terrilee Files.   Neck pain    and left shoulder pain: managed by osteopathic manipulation by Dr. Terrilee Files. ?cervical radiculopathy   ovarian ca 12/01/2021   PAC (premature atrial contraction)    PONV (postoperative nausea and vomiting)     Past Surgical History:  Procedure Laterality Date   ABDOMINAL EXPLORATION SURGERY  2009   fulguration of endometriosis - [facility] surgery   DEBULKING  02/15/2022   revoved uterus and ovaries, omentun, gallbladder appendix, spleen  and 6 inches of colon   ILEOSTOMY  02/15/2022   ILEOSTOMY CLOSURE N/A 06/22/2022   Procedure: ILEOSTOMY TAKEDOWN;  Surgeon: Carver Fila, MD;  Location: [facility];  Service: General;  Laterality: N/A;   IR IMAGING GUIDED PORT INSERTION  12/08/2021   IR US GUIDE BX ASP/DRAIN  12/08/2021   SALPINGECTOMY Right 2009   TONSILLECTOMY     TUBAL LIGATION Left 2009    Family History  Problem Relation Age of Onset   Rheum arthritis Mother    Stroke Mother    Hypertension Mother    Heart disease Mother    Arthritis Mother    Breast cancer Mother 45   Heart attack Mother 37   Diabetes Father    Heart disease Father    Hyperlipidemia Father    Hypertension Father    Heart attack Father 79   Non-Hodgkin's lymphoma Paternal Uncle 17   Heart disease Maternal Grandmother    Heart attack Maternal Grandmother 64   Arthritis  Maternal Grandmother    Early death Maternal Grandfather 30       MVA   Diabetes Paternal Grandmother    Hypertension Paternal Grandmother    Heart disease Paternal Grandmother    Heart attack Paternal Grandmother 55   Heart disease Paternal Grandfather    Heart  attack Paternal Grandfather 23   Coronary artery disease Other    Glaucoma Other     Social History   Socioeconomic History   Marital status: Married    Spouse name: Not on file   Number of children: 3   Years of education: Not on file   Highest education level: Not on file  Occupational History   Occupation: Academic librarian: [employer]    Comment: at [employer]   Tobacco Use   Smoking status: Never   Smokeless tobacco: Never  Vaping Use   Vaping status: Never Used  Substance and Sexual Activity   Alcohol use: Not Currently    Comment: Occasionally   Drug use: No   Sexual activity: Yes    Partners: Male    Birth control/protection: Surgical  Other Topics Concern   Not on file  Social History Narrative   Married, 3 kids.    Lives in [place].   Educ: Masters deg nursing.   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   No tob/drugs.   Alc: social.   Social Determinants of Health   Financial Resource Strain: Low Risk  (02/17/2022)   Received from [facility], [facility]   Overall Financial Resource Strain (CARDIA)    Difficulty of Paying Living Expenses: Not very hard  Food Insecurity: No Food Insecurity (06/22/2022)   Hunger Vital Sign    Worried About Running Out of Food in the Last Year: Never true    Ran Out of Food in the Last Year: Never true  Transportation Needs: No Transportation Needs (06/22/2022)   PRAPARE - Administrator, Civil Service (Medical): No    Lack of Transportation (Non-Medical): No  Physical Activity: Insufficiently Active (11/18/2021)   Received from [facility], [facility]   Exercise Vital Sign    Days of Exercise per Week: 2 days    Minutes of Exercise per Session: 40 min  Stress: No Stress Concern Present (11/18/2021)   Received from [facility], [facility] of Occupational Health - Occupational Stress Questionnaire     Feeling of Stress : Only a little  Social Connections: Socially Integrated (11/18/2021)   Received from [facility], [facility]   Social Network    How would you rate your social network (family, work, friends)?: Good participation with social networks    Current Medications:  Current Outpatient Medications:    acetaminophen (TYLENOL) 650 MG CR tablet, Take 650 mg by mouth every 8 (eight) hours as needed for pain., Disp: , Rfl:    Cholecalciferol (VITAMIN D) 50 MCG (2000 UT) tablet, Take 2,000 Units by mouth daily., Disp: , Rfl:    diphenhydrAMINE (BENADRYL) 25 MG tablet, Take 25 mg by mouth every 6 (six) hours as needed for allergies., Disp: , Rfl:    famotidine (PEPCID) 10 MG tablet, Take 10 mg by mouth daily as needed for heartburn or indigestion., Disp: , Rfl:    LORazepam (ATIVAN) 0.5 MG tablet, Take 1 tablet (0.5 mg total) by mouth every 8 (eight)  hours as needed for anxiety. Do not take and drive, Use with caution. Do not take with pain meds, Disp: 30 tablet, Rfl: 0   Psyllium (METAMUCIL PO), Take 1 tablet by mouth daily., Disp: , Rfl:    loratadine (CLARITIN) 10 MG tablet, Take by mouth., Disp: , Rfl:   Review of Systems: + Dyspareunia, hot flashes Denies appetite changes, fevers, chills, fatigue, unexplained weight changes. Denies hearing loss, neck lumps or masses, mouth sores, ringing in ears or voice changes. Denies cough or wheezing.  Denies shortness of breath. Denies chest pain or palpitations. Denies leg swelling. Denies abdominal distention, pain, blood in stools, constipation, diarrhea, nausea, vomiting, or early satiety. Denies dysuria, frequency, hematuria or incontinence. Denies pelvic pain, vaginal bleeding or vaginal discharge.   Denies joint pain, back pain or muscle pain/cramps. Denies itching, rash, or wounds. Denies dizziness, headaches, numbness or seizures. Denies swollen lymph nodes or glands, denies easy bruising or bleeding. Denies anxiety,  depression, confusion, or decreased concentration.  Physical Exam: BP 135/70   Pulse 97   Temp 98.5 F (36.9 C)   Resp 19   Wt 118 lb (53.5 kg)   LMP  (LMP Unknown) Comment: tubal ligation  SpO2 100%   BMI 23.83 kg/m  General: Alert, oriented, no acute distress. HEENT: Normocephalic, atraumatic, sclera anicteric. Chest: Clear to auscultation bilaterally.  No wheezes or rhonchi. Cardiovascular: Regular rate and rhythm, no murmurs. Abdomen: soft, nontender.  Normoactive bowel sounds.  No masses or hepatosplenomegaly appreciated.  Well-healed incisions. Extremities: Grossly normal range of motion.  Warm, well perfused.  No edema bilaterally. Skin: No rashes or lesions noted. Lymphatics: No cervical, supraclavicular, or inguinal adenopathy. GU: Normal appearing external genitalia without erythema, excoriation, or lesions.  Speculum exam reveals mildly atrophic vaginal mucosa, no lesions noted.  Bimanual exam reveals Smooth, no masses or nodularity.  Rectovaginal exam confirms these findings.  Laboratory & Radiologic Studies: CT A/P on 8/22: IMPRESSION: No acute abdominal or pelvic pathology.  Assessment & Plan: CORREEN REBECK is a 52 y.o. 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 She has completed 3 cycles of adjuvant chemotherapy with etoposide and cisplatin. She is s/p ileostomy reversal on 06/22/2022.  The patient is overall doing very well and is NED on exam today.  Reviewed recent CT scan.  I gave her a copy of the report.  Plan to get CA125 and hCG today.  Discussed her menopausal symptoms including hot flashes and dyspareunia.  Discussed nonhormonal and hormonal treatment options.  Some germ cell tumors can be estrogen receptor positive.  I suggested that we test her tumor for estrogen receptors.  If negative, I think it would be very reasonable to start  her on estrogen replacement therapy if she desires.  We could also do nonhormonal therapy for hot flashes and vaginal estrogen for her vaginal dryness and dyspareunia.  Per NCCN surveillance recommendations, we will continue with clinic visits every 2-4 months to include an exam and tumor markers with CT imaging every 3-4 months.  Reviewed signs and symptoms that should prompt a phone call before her next scheduled visit.  22 minutes of total time was spent for this patient encounter, including preparation, face-to-face counseling with the patient and coordination of care, and documentation of the encounter.  Eugene Garnet, MD  Division of Gynecologic Oncology  Department of Obstetrics and Gynecology  [facility]

## 2022-10-15 LAB — BETA HCG QUANT (REF LAB): hCG Quant: <1 m[IU]/mL

## 2022-10-16 LAB — CA 125: Cancer Antigen (CA) 125: 10.2 U/mL (ref 0.0–38.1)

## 2022-10-20 ENCOUNTER — Encounter: Payer: Self-pay | Admitting: Gynecologic Oncology

## 2022-10-20 ENCOUNTER — Ambulatory Visit (HOSPITAL_COMMUNITY): Payer: 59

## 2022-10-21 ENCOUNTER — Other Ambulatory Visit: Payer: Self-pay | Admitting: Gynecologic Oncology

## 2022-10-21 ENCOUNTER — Other Ambulatory Visit (HOSPITAL_COMMUNITY): Payer: Self-pay

## 2022-10-21 DIAGNOSIS — E8940 Asymptomatic postprocedural ovarian failure: Secondary | ICD-10-CM

## 2022-10-21 MED ORDER — ESTRADIOL 0.05 MG/24HR TD PTTW
1.0000 | MEDICATED_PATCH | TRANSDERMAL | 12 refills | Status: DC
Start: 2022-10-24 — End: 2023-10-11
  Filled 2022-10-21: qty 8, 28d supply, fill #0
  Filled 2022-11-13: qty 8, 28d supply, fill #1
  Filled 2022-12-08: qty 8, 28d supply, fill #2
  Filled 2023-01-06: qty 8, 28d supply, fill #3
  Filled 2023-01-31: qty 8, 28d supply, fill #0
  Filled 2023-03-01: qty 8, 28d supply, fill #1
  Filled 2023-03-29: qty 8, 28d supply, fill #2
  Filled 2023-04-22 (×2): qty 8, 28d supply, fill #3
  Filled 2023-05-19: qty 8, 28d supply, fill #4
  Filled 2023-06-17 (×2): qty 8, 28d supply, fill #5
  Filled 2023-07-25: qty 8, 28d supply, fill #6
  Filled 2023-08-19: qty 8, 28d supply, fill #7
  Filled 2023-09-18: qty 8, 28d supply, fill #8

## 2022-11-08 ENCOUNTER — Telehealth: Payer: Self-pay

## 2022-11-08 NOTE — Telephone Encounter (Signed)
Received VM from disability insurance company asking if we had received a disability claim on pt. This RN forwarded message to FMLA/PA/Disability team.

## 2022-11-14 ENCOUNTER — Other Ambulatory Visit (HOSPITAL_COMMUNITY): Payer: Self-pay

## 2022-11-22 NOTE — Progress Notes (Unsigned)
[name] [address] [place] [address] Phone: [phone] Subjective:   [name], am serving as a scribe for Dr. Antoine Primas.  I'm seeing this patient by the request  of:  Patient, No Pcp Per  CC: left hip pain   [id number]  04/13/2022 Patient surprisingly has healed significantly faster than anticipated.  We discussed still different shoes.  Patient wants to start walking again and do feel that appropriate shoes will be more beneficial and given discussion what to potentially do.  Follow-up with me again as needed   Updated 11/23/2022 [name] is a 52 y.o. female coming in with complaint of L hip pain, since we see seen patient ovarian cancer, s/p surgery, radiation and chemo. Pain has been going on since having surgeries back in January. Pain is located over GT and in groin and happens sometimes in both hips. More in L. Sensation is more pulling can feel more with activity. Has continued to exercise and will take a tylenol if needed.   Scheduled CT again 12/5  CT abd/pelvis 8/29- showed no bony abnormality     Past Medical History:  Diagnosis Date   Arthritis    Endometriosis    Family history of breast cancer    Family history of breast cancer in mother    at age 66   GAD (generalized anxiety disorder)    lexapro helped 2017 (Dr. Emmit Alexanders did have extra stress of the Master's degree program on her at that time.  Got counseling.       GERD (gastroesophageal reflux disease)    Gestational diabetes    History of kidney stones    Low back pain    Dr. Terrilee Files.   Neck pain    and left shoulder pain: managed by osteopathic manipulation by Dr. Terrilee Files. ?cervical radiculopathy   ovarian ca 12/01/2021   PAC (premature atrial contraction)    PONV (postoperative nausea and vomiting)    Past Surgical History:  Procedure Laterality Date   ABDOMINAL EXPLORATION SURGERY  2009   fulguration of endometriosis - [facility]  surgery   DEBULKING  02/15/2022   revoved uterus and ovaries, omentun, gallbladder appendix, spleen  and 6 inches of colon   ILEOSTOMY  02/15/2022   ILEOSTOMY CLOSURE N/A 06/22/2022   Procedure: ILEOSTOMY TAKEDOWN;  Surgeon: Carver Fila, MD;  Location: [facility];  Service: General;  Laterality: N/A;   IR IMAGING GUIDED PORT INSERTION  12/08/2021   IR US GUIDE BX ASP/DRAIN  12/08/2021   SALPINGECTOMY Right 2009   TONSILLECTOMY     TUBAL LIGATION Left 2009   Social History   Socioeconomic History   Marital status: Married    Spouse name: Not on file   Number of children: 3   Years of education: Not on file   Highest education level: Not on file  Occupational History   Occupation: nurse    Employer: [employer]    Comment: at [employer]   Tobacco Use   Smoking status: Never   Smokeless tobacco: Never  Vaping Use   Vaping status: Never Used  Substance and Sexual Activity   Alcohol use: Not Currently    Comment: Occasionally   Drug use: No   Sexual activity: Yes    Partners: Male    Birth control/protection: Surgical  Other Topics Concern   Not on file  Social History Narrative   Married, 3 kids.    L[place] in [place].   Educ: Masters deg nursing.   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   No tob/drugs.   Alc: social.   Social Determinants of Health   Financial Resource Strain: Low Risk  (02/17/2022)   [facility], [facility]   Overall Financial Resource Strain (CARDIA)    Difficulty of Paying Living Expenses: Not very hard  Food Insecurity: No Food Insecurity (06/22/2022)   Hunger Vital Sign    Worried About Running Out of Food in the Last Year: Never true    Ran Out of Food in the Last Year: Never true  Transportation Needs: No Transportation Needs (06/22/2022)   PRAPARE - Administrator, Civil Service (Medical): No    Lack of Transportation  (Non-Medical): No  Physical Activity: Insufficiently Active (11/18/2021)   Received from [facility], [facility]   Exercise Vital Sign    Days of Exercise per Week: 2 days    Minutes of Exercise per Session: 40 min  Stress: No Stress Concern Present (11/18/2021)   Received from [facility], [facility] of Occupational Health - Occupational Stress Questionnaire    Feeling of Stress : Only a little  Social Connections: Socially Integrated (11/18/2021)   Received from [facility], [facility]   Social Network    How would you rate your social network (family, work, friends)?: Good participation with social networks   Allergies  Allergen Reactions   Latex Other (See Comments)    Sensitivity only   Sulfa Antibiotics Hives and Other (See Comments)   Sulfamethoxazole-Trimethoprim Rash   Septra [Bactrim] Rash   Family History  Problem Relation Age of Onset   Rheum arthritis Mother    Stroke Mother    Hypertension Mother    Heart disease Mother    Arthritis Mother    Breast cancer Mother 76   Heart attack Mother 34   Diabetes Father    Heart disease Father    Hyperlipidemia Father    Hypertension Father    Heart attack Father 38   Non-Hodgkin's lymphoma Paternal Uncle 35   Heart disease Maternal Grandmother    Heart attack Maternal Grandmother 95   Arthritis Maternal Grandmother    Early death Maternal Grandfather 30       MVA   Diabetes Paternal Grandmother    Hypertension Paternal Grandmother    Heart disease Paternal Grandmother    Heart attack Paternal Grandmother 77   Heart disease Paternal Grandfather    Heart attack Paternal Grandfather 33   Coronary artery disease Other    Glaucoma Other     Current Outpatient Medications (Endocrine & Metabolic):    estradiol (VIVELLE-DOT) 0.05 MG/24HR patch, Place 1 patch (0.05 mg total) onto the skin 2 (two) times a week.   Current Outpatient Medications (Respiratory):    diphenhydrAMINE  (BENADRYL) 25 MG tablet, Take 25 mg by mouth every 6 (six) hours as needed for allergies.   loratadine (CLARITIN) 10 MG tablet, Take by mouth.  Current Outpatient Medications (Analgesics):    acetaminophen (TYLENOL) 650 MG CR tablet, Take 650 mg by mouth every 8 (eight) hours as needed for pain.   Current Outpatient Medications (Other):    Cholecalciferol (VITAMIN D) 50 MCG (2000 UT) tablet, Take 2,000 Units by mouth daily.   famotidine (PEPCID) 10 MG tablet, Take 10 mg by mouth daily as needed  for heartburn or indigestion.   LORazepam (ATIVAN) 0.5 MG tablet, Take 1 tablet (0.5 mg total) by mouth every 8 (eight) hours as needed for anxiety. Do not take and drive, Use with caution. Do not take with pain meds   Psyllium (METAMUCIL PO), Take 1 tablet by mouth daily.   Reviewed prior external information including notes and imaging from  primary care provider As well as notes that were available from care everywhere and other healthcare systems.  Past medical history, social, surgical and family history all reviewed in electronic medical record.  No pertanent information unless stated regarding to the chief complaint.   Review of Systems:  No headache, visual changes, nausea, vomiting, diarrhea, constipation, dizziness, abdominal pain, skin rash, fevers, chills, night sweats, weight loss, swollen lymph nodes, body aches, joint swelling, chest pain, shortness of breath, mood changes. POSITIVE muscle aches  Objective  There were no vitals taken for this visit.   General: No apparent distress alert and oriented x3 mood and affect normal, dressed appropriately.  HEENT: Pupils equal, extraocular movements intact  Respiratory: Patient's speak in full sentences and does not appear short of breath  Cardiovascular: No lower extremity edema, non tender, no erythema  Left hip exam shows patient is severely tender to palpation over the left greater trochanteric area.  Tightness noted in the paraspinal  musculature of the back as well.  Negative straight leg test.  Mild loss of extension of the back.  Osteopathic findings C2 flexed rotated and side bent right T3 extended rotated and side bent right inhaled third rib T9 extended rotated and side bent left L2 flexed rotated and side bent right Sacrum left on left     Impression and Recommendations:    The above documentation has been reviewed and is accurate and complete Judi Saa, DO  Low back pain Low back exam does have some loss of lordosis noted.  We discussed with patient we will continue to monitor closely.  Patient CT scans though were able to address some of the arthritic changes but very mild overall.  Patient can continue to stay active.  We discussed different medications but will hold on any significant changes.  Increase activity as tolerated.  Will come back again in 6 to 8 weeks.  Has done well with treatment with osteopathic manipulation previously.     Decision today to treat with OMT was based on Physical Exam  After verbal consent patient was treated with HVLA, ME, FPR techniques in cervical, thoracic, rib, lumbar and sacral areas, all areas are chronic   Patient tolerated the procedure well with improvement in symptoms  Patient given exercises, stretches and lifestyle modifications  See medications in patient instructions if given  Patient will follow up in 4-8 weeks

## 2022-11-23 ENCOUNTER — Ambulatory Visit: Payer: 59 | Admitting: Family Medicine

## 2022-11-23 VITALS — BP 122/76 | HR 83 | Ht 59.0 in

## 2022-11-23 DIAGNOSIS — M9903 Segmental and somatic dysfunction of lumbar region: Secondary | ICD-10-CM | POA: Diagnosis not present

## 2022-11-23 DIAGNOSIS — M9902 Segmental and somatic dysfunction of thoracic region: Secondary | ICD-10-CM | POA: Diagnosis not present

## 2022-11-23 DIAGNOSIS — M5441 Lumbago with sciatica, right side: Secondary | ICD-10-CM

## 2022-11-23 DIAGNOSIS — M9901 Segmental and somatic dysfunction of cervical region: Secondary | ICD-10-CM

## 2022-11-23 DIAGNOSIS — M9908 Segmental and somatic dysfunction of rib cage: Secondary | ICD-10-CM

## 2022-11-23 DIAGNOSIS — M9904 Segmental and somatic dysfunction of sacral region: Secondary | ICD-10-CM

## 2022-11-23 DIAGNOSIS — G8929 Other chronic pain: Secondary | ICD-10-CM

## 2022-11-23 NOTE — Patient Instructions (Signed)
Do prescribed exercises at least 3x a week Vit C 500mg  daily B12 daily B6 100mg  daily See you again in 5-6 weeks

## 2022-11-24 ENCOUNTER — Encounter: Payer: Self-pay | Admitting: Family Medicine

## 2022-11-24 NOTE — Assessment & Plan Note (Signed)
Low back exam does have some loss of lordosis noted.  We discussed with patient we will continue to monitor closely.  Patient CT scans though were able to address some of the arthritic changes but very mild overall.  Patient can continue to stay active.  We discussed different medications but will hold on any significant changes.  Increase activity as tolerated.  Will come back again in 6 to 8 weeks.  Has done well with treatment with osteopathic manipulation previously.

## 2022-12-01 ENCOUNTER — Inpatient Hospital Stay: Payer: 59 | Attending: Gynecologic Oncology

## 2022-12-01 ENCOUNTER — Other Ambulatory Visit: Payer: Self-pay

## 2022-12-01 DIAGNOSIS — C569 Malignant neoplasm of unspecified ovary: Secondary | ICD-10-CM | POA: Insufficient documentation

## 2022-12-01 DIAGNOSIS — Z23 Encounter for immunization: Secondary | ICD-10-CM | POA: Diagnosis not present

## 2022-12-01 DIAGNOSIS — Z9221 Personal history of antineoplastic chemotherapy: Secondary | ICD-10-CM | POA: Insufficient documentation

## 2022-12-01 LAB — CBC WITH DIFFERENTIAL/PLATELET
Abs Immature Granulocytes: 0.03 10*3/uL (ref 0.00–0.07)
Basophils Absolute: 0.1 10*3/uL (ref 0.0–0.1)
Basophils Relative: 1 %
Eosinophils Absolute: 0 10*3/uL (ref 0.0–0.5)
Eosinophils Relative: 0 %
HCT: 38 % (ref 36.0–46.0)
Hemoglobin: 12.8 g/dL (ref 12.0–15.0)
Immature Granulocytes: 0 %
Lymphocytes Relative: 17 %
Lymphs Abs: 1.9 10*3/uL (ref 0.7–4.0)
MCH: 33 pg (ref 26.0–34.0)
MCHC: 33.7 g/dL (ref 30.0–36.0)
MCV: 97.9 fL (ref 80.0–100.0)
Monocytes Absolute: 1.1 10*3/uL — ABNORMAL HIGH (ref 0.1–1.0)
Monocytes Relative: 10 %
Neutro Abs: 7.8 10*3/uL — ABNORMAL HIGH (ref 1.7–7.7)
Neutrophils Relative %: 72 %
Platelets: 354 10*3/uL (ref 150–400)
RBC: 3.88 MIL/uL (ref 3.87–5.11)
RDW: 13.5 % (ref 11.5–15.5)
WBC: 10.8 10*3/uL — ABNORMAL HIGH (ref 4.0–10.5)
nRBC: 0 % (ref 0.0–0.2)

## 2022-12-01 LAB — COMPREHENSIVE METABOLIC PANEL
ALT: 23 U/L (ref 0–44)
AST: 19 U/L (ref 15–41)
Albumin: 4.4 g/dL (ref 3.5–5.0)
Alkaline Phosphatase: 61 U/L (ref 38–126)
Anion gap: 7 (ref 5–15)
BUN: 18 mg/dL (ref 6–20)
CO2: 26 mmol/L (ref 22–32)
Calcium: 9.7 mg/dL (ref 8.9–10.3)
Chloride: 106 mmol/L (ref 98–111)
Creatinine, Ser: 0.77 mg/dL (ref 0.44–1.00)
GFR, Estimated: >60 mL/min (ref >60)
Glucose, Bld: 117 mg/dL — ABNORMAL HIGH (ref 70–99)
Potassium: 4 mmol/L (ref 3.5–5.1)
Sodium: 139 mmol/L (ref 135–145)
Total Bilirubin: 0.4 mg/dL (ref 0.3–1.2)
Total Protein: 7 g/dL (ref 6.5–8.1)

## 2022-12-01 MED ORDER — INFLUENZA VIRUS VACC SPLIT PF (FLUZONE) 0.5 ML IM SUSY
0.5000 mL | PREFILLED_SYRINGE | Freq: Once | INTRAMUSCULAR | Status: AC
  Administered 2022-12-01: 0.5 mL via INTRAMUSCULAR
  Filled 2022-12-01: qty 0.5

## 2022-12-01 MED ORDER — SODIUM CHLORIDE 0.9% FLUSH
10.0000 mL | Freq: Once | INTRAVENOUS | Status: AC
  Administered 2022-12-01: 10 mL

## 2022-12-01 MED ORDER — HEPARIN SOD (PORK) LOCK FLUSH 100 UNIT/ML IV SOLN
500.0000 [IU] | Freq: Once | INTRAVENOUS | Status: AC
  Administered 2022-12-01: 500 [IU]

## 2022-12-01 NOTE — Patient Instructions (Signed)

## 2022-12-08 ENCOUNTER — Encounter: Payer: Self-pay | Admitting: Hematology and Oncology

## 2022-12-08 ENCOUNTER — Inpatient Hospital Stay: Payer: 59 | Admitting: Hematology and Oncology

## 2022-12-08 ENCOUNTER — Other Ambulatory Visit (HOSPITAL_COMMUNITY): Payer: Self-pay

## 2022-12-08 VITALS — BP 133/75 | HR 98 | Temp 99.0°F | Resp 18 | Ht 59.0 in | Wt 124.0 lb

## 2022-12-08 DIAGNOSIS — C569 Malignant neoplasm of unspecified ovary: Secondary | ICD-10-CM | POA: Diagnosis not present

## 2022-12-08 DIAGNOSIS — Z9081 Acquired absence of spleen: Secondary | ICD-10-CM | POA: Diagnosis not present

## 2022-12-08 NOTE — Assessment & Plan Note (Signed)
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

## 2022-12-08 NOTE — Progress Notes (Signed)
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   All questions were answered. The patient knows to call the clinic with any problems, questions or concerns. The total time spent in the appointment was 20 minutes encounter with patients including review of chart and various tests results, discussions about plan of care and coordination of care plan   [name], MD 12/08/2022 12:57 PM  INTERVAL HISTORY: Please see below for problem oriented charting. she returns for surveillance follow-up for history of germ cell tumor of the ovary She is doing well and back to work She is gaining some weight She has some mild fatigue but she is exercising on a regular basis She have slight constipation but overall her bowel habits are regulated We discussed recent test results and future follow-up  REVIEW OF SYSTEMS:   Constitutional: Denies fevers, chills or abnormal weight loss Eyes: Denies blurriness of vision Ears, nose, mouth, throat, and face: Denies mucositis or sore throat Respiratory: Denies cough, dyspnea or wheezes Cardiovascular: Denies palpitation, chest discomfort or lower extremity swelling Gastrointestinal:  Denies nausea, heartburn or change in bowel  habits Skin: Denies abnormal skin rashes Lymphatics: Denies new lymphadenopathy or easy bruising Neurological:Denies numbness, tingling or new weaknesses Behavioral/Psych: Mood is stable, no new changes  All other systems were reviewed with the patient and are negative.  I have reviewed the past medical history, past surgical history, social history and family history with the patient and they are unchanged from previous note.  ALLERGIES:  is allergic to latex, sulfa antibiotics, sulfamethoxazole-trimethoprim, and septra [bactrim].  MEDICATIONS:  Current Outpatient Medications  Medication Sig Dispense Refill   ascorbic acid (VITAMIN C) 500 MG tablet Take 500 mg by mouth daily.     cyanocobalamin (VITAMIN B12) 1000 MCG tablet Take 1,000 mcg by mouth daily.     pyridoxine (B-6) 500 MG tablet Take 500 mg by mouth daily.     acetaminophen (TYLENOL) 650 MG CR tablet Take 650 mg by mouth every 8 (eight) hours as needed for pain.     Cholecalciferol (VITAMIN D) 50 MCG (2000 UT) tablet Take 2,000 Units by mouth daily.     diphenhydrAMINE (BENADRYL) 25 MG tablet Take 25 mg by mouth every 6 (six) hours as needed for allergies.     estradiol (VIVELLE-DOT) 0.05 MG/24HR patch Place 1 patch (0.05 mg total) onto the skin 2 (two) times a week. 8 patch 12   famotidine (PEPCID) 10 MG tablet Take 10 mg by mouth daily as needed for heartburn or indigestion.     loratadine (CLARITIN) 10 MG tablet Take by mouth.     LORazepam (ATIVAN) 0.5 MG tablet Take 1 tablet (0.5 mg total) by mouth every 8 (eight) hours as needed for anxiety. Do not take and drive, Use with caution. Do not  take with pain meds 30 tablet 0   No current facility-administered medications for this visit.    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 21.1 by 10.9 by 18.7 cm abdominopelvic mass, with a satellite mass in the right upper quadrant anteriorly, and moderate ascites with tumor deposits along the pelvic ascites posteriorly. The mass could be arising from right or left ovary, or both. The mass displaces the bowel and IVC, but there is no IVC thrombus or pelvic DVT identified. 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 2. Additional satellite mass above the liver is partially included, along with tumor deposits along the ascites. 3. Low-level edema along the lateral abdominal wall musculature. 4. Flattening of the IVC at the level of the aortic bifurcation due to the large abdominopelvic tumor. No findings of pelvic DVT.     12/07/2021 Imaging   CT abdomen and pelvis 1. Large central abdominal and pelvic mass consistent with known malignancy. This is incompletely visualized by this CT of the abdomen, but was seen on recent pelvic MRI. 2. 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 Right omental implant consistent with metastatic disease. Small amount of abdominal ascites. 3. No evidence for bowel or ureteral obstruction. 4. Aortic Atherosclerosis (ICD10-I70.0). 5. Chest findings dictated separately.   12/07/2021 Imaging   CT chest 1. Negative for pulmonary embolism. And no acute or metastatic process identified in the Chest. 2. Partially visible liver mass and ascites, staging CT Abdomen today is reported separately.     12/07/2021 Procedure   Successful placement of a right internal jugular approach power injectable Port-A-Cath. The catheter is ready for immediate use.   12/07/2021 Procedure   Ultrasound-guided biopsy of right upper quadrant mass.    12/08/2021 Initial Diagnosis   Ovarian cancer (HCC)   12/08/2021 Cancer Staging   Staging form: Ovary,  Fallopian Tube, and Primary Peritoneal Carcinoma, AJCC 8th Edition - Clinical stage from 12/08/2021: FIGO Stage IIIC (cT3c, cN0, cM0) - Signed by [name], MD on 12/10/2021 Stage prefix: Initial diagnosis   12/20/2021 - 12/28/2021 Chemotherapy   Patient is on Treatment Plan : Ovarian germ cell tumor q21d x 3 Cycles     01/03/2022 Imaging   IMPRESSION: 1. Large central abdominopelvic mass compatible with malignancy, 3450 cubic cm in volume. This may be arising from the ovaries with slight eccentricity to the right in the pelvis. 2. Mild increase in size of the right omental tumor deposit. 3. Mild increase in size of the subcapsular mass along the dome of the right hepatic lobe, with suspected hepatic invasion. 4. Scattered malignant ascites. 5. No dilated bowel to suggest obstruction. 6. 2 mm left kidney lower pole nonobstructive renal calculus. 7. Mild abdominal aortic atherosclerotic vascular disease.   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  Postoperative Diagnosis: Status post R0 cytoreductive surgery and heating intraperitoneal chemotherapy  Procedures: LAPAROSCOPY, ABDOMEN, PERITONEUM, & OMENTUM, DIAGNOSTIC, W/WO COLLECTION SPECIMEN(S) BY BRUSHING OR WASHING  EXCISION/DESTRUCTION, OPEN, INTRA-ABD TUMOR/CYST/ENDOMETRIOMA, 1+ PERITONEAL/MESENTERI/RETROPERIT 5CM OR <COLECTOMY, PARTIAL; WITH ANASTOMOSIS COLOSTOMY OR SKIN LEVEL CECOSTOMY SPLENECTOMY; TOT (SEPART  PROC) EXPLORATORY LAPAROTOMY, EXPLORATORY CELIOTOMY WITH OR WITHOUT BIOPSY(S) HYPERTHERMIA, EXTERNALLY GENERATED; DEEP CHEMOTHERAPY ADMINISTRATION INTO THE PERITONEAL CAVITY VIA INDWELLING PORT OR CATHETER RESECTION (INITIAL) OVARIAN, TUBAL/PRIM PERITONEAL MALIG W/BIL S&O/OMENTECT; W/RAD DISSECTION FOR DEBULKING CHOLECYSTECTOMY APPENDECTOMY OMENTECTOMY, EPIPLOECTOMY, RESECTION OF OMENTUM Procedures: Exploratory laparotomy, total abdominal  hysterectomy with radical dissection, bilateral ureterolysis, bilateral salpingo-oophorectomy, posterior cul-de-sac peritonectomy, tumor debulking by Dr Pricilla Holm with Gynecology Oncology Dr Vanessa Ralphs with surgical oncology: Diagnostic laparoscopy, exploratory laparotomy, low anterior resection with primary end-to-end anastomosis, enterolysis, cholecystectomy, tumor capsule tumor debulking, splenectomy, appendectomy, omentectomy, diverting loop ileostomy formation  Surgeon: Eugene Garnet, MD  Findings: On diagnostic laparoscopy, only able to clearly visualize the left upper quadrant. Adhesions of bowel in the right upper quadrant limiting survey, minimal ascites noted in the pelvis, scattered tumor plaques visualized on left diaphragm. Given these findings, the decision was made to convert to exploratory laparotomy. Multiple loops of small bowel encased in a Cancn like structure in the midline with multiple filmy adhesions tethering to the superior aspect of the a large pelvic mass. Multicystic and necrotic pelvic mass originating from left ovary, approximately 18 x 20 cm incorporated within the left broad ligament with the left tube draped overlying the left ovarian mass. Bilateral tubes normal in appearance, normal-appearing uterus with smooth serosa. Right adnexa similarly with approximate 10 x 8 multicystic and necrotic mass. Diffuse tumor plaques encompassing posterior cul-de-sac peritoneum. Rectosigmoid densely adhered to posterior aspect of left adnexal mass requiring dissection and ultimately resection. Scattered treated subcentimeter nodules along small bowel mesentery. 5 x 5 cm necrotic and multicystic tumor at the level of the hepatic flexure. Enlarged similar multicystic necrotic tumor adherent between the right diaphragm and the liver without any evidence of liver invasion and tumor encased solely within the liver capsule. Multiple tumor nodules along the spleen requiring splenectomy. Omentum  overall fairly normal in appearance with only scattered small tumor deposits, no omental cake was encountered. Please see surgical oncology operative note for further details regarding operative findings for their portion of the procedure.  Specimens: ID Type Source Tests Collected by Time Destination 1 : Abdominal fat pad Tissue Abdomen SURGICAL PATHOLOGY EXAM Marion Downer, MD 02/15/2022 [phone] 2 : Left adnexa Tissue Abdomen SURGICAL PATHOLOGY EXAM Marion Downer, MD 02/15/2022 [phone] 3 : right adnexa Tissue Abdomen SURGICAL PATHOLOGY EXAM Marion Downer, MD 02/15/2022 [phone] 4 : Left perimetrium Tissue Uterus SURGICAL PATHOLOGY EXAM Carver Fila, MD 02/15/2022 [phone] 5 : Uterus and cervix Tissue Uterus SURGICAL PATHOLOGY EXAM Marion Downer, MD 02/15/2022 1018 6 : Low Anterior Resection Tissue Abdomen SURGICAL PATHOLOGY EXAM Marion Downer, MD 02/15/2022 1055 7 : Right peritoneum Tissue Abdomen SURGICAL PATHOLOGY EXAM Marion Downer, MD 02/15/2022 1058 8 : Right retroperitoneal mass Tissue Abdomen SURGICAL PATHOLOGY EXAM Marion Downer, MD 02/15/2022 1101 9 : Omentum Tissue Omentum SURGICAL PATHOLOGY EXAM Marion Downer, MD 02/15/2022 1130 10 : Gallbladder Tissue Gallbladder SURGICAL PATHOLOGY EXAM Marion Downer, MD 02/15/2022 1143 11 : Right diaphragm Tissue Diaphragm SURGICAL PATHOLOGY EXAM Marion Downer, MD 02/15/2022 1143 12 : Left diaphragm nodule Tissue Diaphragm SURGICAL PATHOLOGY EXAM Marion Downer, MD 02/15/2022 1157 13 : Spleen Tissue Spleen SURGICAL PATHOLOGY EXAM Marion Downer, MD 02/15/2022 1204 14 : Appendix Tissue Appendix SURGICAL PATHOLOGY EXAM Marion Downer, MD 02/15/2022 1213 15 : Small bowel mesenteric nodules Tissue Small Bowel SURGICAL PATHOLOGY EXAM Marion Downer, MD 02/15/2022 1215 16 : Colon mesenteric nodule Tissue Colon SURGICAL PATHOLOGY EXAM Tora Duck  Berneda Rose, MD 02/15/2022 1221 17 : Hepatic flexure mass Tissue Liver SURGICAL PATHOLOGY EXAM Marion Downer, MD 02/15/2022 1227  Complications: None  Indications for Procedure: [name] is a 52 y.o. 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 Prior to the procedure, all risks, benefits, and alternatives were discussed and informed surgical consent was signed.  Procedure: Patient was taken to the operating room where general anesthesia was achieved. She was positioned in dorsal lithotomy and prepped and draped per the surgical oncology team. A foley catheter was inserted into the bladder. A 5 mm skin incision was made in the left upper quadrant at Palmer's point, the abdomen was then entered with direct Optiview entry and the abdomen was insufflated. Findings were noted as above.  The laparoscope was then removed from the abdomen, the pneumoperitoneum was maintained. A vertical midline incision was made with the scalpel and the abdomen was entered sharply. The abdomen and pelvis were surveyed with findings as documented above. A wound protector was placed followed by the [facility] retractor.  The enlarged left adnexal mass was then carefully removed of its bilateral sidewall attachments using a mixture of blunt and cautery dissection. Multiple small bowel loops were adhered to the superior and posterior aspect of the mass with filmy adhesions which were carefully bluntly resected. The rectosigmoid was noted to be densely adhered to the posterior aspect of the pelvic mass. Dissection was attempted using mixture of blunt dissection as well as sharp dissection with the Metzenbaum scissors, and unavoidable enterotomy of approximately 4 cm was made to the anterior surface of the rectosigmoid. This enterotomy was then closed with 4-0 PDS in a running fashion to minimize contamination. Further dissection  removing the remainder of the rectosigmoid from the posterior aspect of the pelvic mass was then undertaken. The pelvic mass was then able to be fully elevated from the pelvis. The left infundibulopelvic ligament was able to be isolated with the left ureter clearly visualized below. The left ureter was further dissected away from the medial leaf of the broad ligament, and a vessel loop was placed. The left infundibulopelvic ligament was then isolated, clamped, and transected using the LigaSure device as well as and suture-ligated. The left utero-ovarian ligament was then isolated, clamped, cauterized and transected. The left pelvic mass was then removed entirely.  Attention was then turned to the right. An additional enlarged multicystic right adnexal mass was noted, this was carefully elevated from the pelvis using blunt dissection. The right round ligament was then grasped, elevated, and transected. The broad ligament was then opened posteriorly. The ureter was identified and the right infundibulopelvic ligament was then isolated, clamped, transected, and tied. The right utero ovarian ligament was then isolated, clamped, and transected using the LigaSure device and the right adnexal mass was removed.  Attention then was turned toward the hysterectomy. A sponge stick was placed within the vagina. The right broad ligament dissection was opened anteriorly and the bladder flap was developed. The left round ligament was then further transected, and the broad ligament opened posteriorly and anteriorly to complete the bladder flap. The right uterine artery was skeletonized, clamped, transected, and suture-ligated. There was significant parametrial tumor involvement on the left side, and therefore a radical dissection was required to isolate the blood supply. The left ureter was further dissected down to the level near the trigone. The left uterine artery was traced to its origin, isolated, and  clamped with vascular  clips. Similarly the left uterine vein due to significant necrotic tumor involvement was friable. Detachment was similarly carefully isolated, and clipped with vascular clips. Sequential clamps were then used to transect the remainder of the broad and cardinal ligaments bilaterally, with each pedicle being suture-ligated. A large right angle clamp was then able to be placed below the cervix, and the uterus and cervix were amputated from the vagina. The vaginal cuff was then closed with several figure-of-eight sutures using 0 Vicryl.    02/15/2022 Pathology Results   A: Abdominal fat pad, excision - Fibroadipose tissue with residual mixed malignant germ cell tumor with treatment effect   B: Left adnexa, salpingo-oophorectomy - Residual mixed malignant germ cell tumor with treatment effect, [id number] (See Comment and Synoptic report) - FIGO stage IIIC - Fallopian tubes with no tumor present   C: Right adnexa, salpingo-oophorectomy - Residual mixed malignant germ cell tumor with treatment effect (See Synoptic report) - Fallopian tubes not identified   D: Left parametrium, biopsy - Fibroadipose tissue with residual mixed malignant germ cell tumor with treatment effect   E: Uterus and cervix, hysterectomy - Right adnexal soft tissue with residual mixed malignant germ cell tumor with treatment effect   Myometrium: - Adenomyosis - Leiomyomata measuring up to 0.7 cm - Extensive chronic serositis   Cervix:  - Ectocervix and endocervix with chronic cervicitis    Endometrium: - Inactive endometrium   F: Low anterior resection - Residual mixed malignant  germ cell tumor with treatment effect involving colonic serosa and pericolonic adipose tissue - Proximal and distal colonic resection margins with chronic serositis, but negative for tumor    G: Right peritoneum, biopsy - Fibroadipose tissue with residual mixed malignant germ cell tumor with treatment effect   H: Right retroperitoneal mass,  excision - Fibroadipose tissue with residual mixed malignant germ cell tumor with treatment effect   I: Omentum, omentectomy - Fibroadipose tissue with residual mixed malignant germ cell tumor with treatment effect   J: Gallbladder, cholecystectomy - Gallbladder with mild chronic serositis and fibrous adhesion   K: Right diaphragm, excision - Fibrous tissue with residual mixed malignant germ cell tumor with treatment effect - Hepatic tissue with no tumor present   L: Left diaphragm nodule, biopsy - Fibrous tissue with residual mixed malignant germ cell tumor with treatment effect   M: Spleen, splenectomy - Residual mixed malignant germ cell tumor with treatment effect involving splenic capsule   N: Appendix, appendectomy - Appendix with fibrous obliteration at the tip - Periappendiceal adipose tissue with acute and chronic inflammation   O: Small bowel mesenteric nodules, excision - Fibroadipose tissue with residual mixed malignant germ cell tumor with treatment effect   P: Mesenteric nodules, excision - Fibroadipose tissue with residual mixed malignant germ cell tumor with treatment effect   Q: Hepatic flexure mass, excision - Fibroadipose tissue with residual mixed malignant germ cell tumor with treatment effect   R: Anvil and donut, biopsy - Benign segment of colon with acute and chronic inflammation and fibrous adhesion  SPECIMEN    Procedure:    Radical hysterectomy     Procedure:    Omentectomy     Procedure:    Peritoneal tumor debulking     Hysterectomy Type:    Abdominal     Specimen Integrity:          Right Ovary Integrity:    Capsule intact     Specimen Integrity:  Left Ovary Integrity:    Capsule intact   TUMOR    Tumor Site:    Bilateral ovaries     Tumor Size:    Greatest Dimension (Centimeters): 19.5 cm     Histologic Type:    Mixed malignant germ cell tumor:  yolk sac tumor, mature teratoma, mature neural elements, immature neural tissue, and  immature cartilage     Ovarian Surface Involvement:    Present, right and left     Other Tissue / Organ Involvement:    Right ovary     Other Tissue / Organ Involvement:    Left ovary     Other Tissue / Organ Involvement:    Right fallopian tube     Other Tissue / Organ Involvement:    Omentum     Other Tissue / Organ Involvement:    Diaphragm and splenic capsule     Largest Extrapelvic Peritoneal Focus:    Macroscopic (greater than 2 cm): Splenic capsule     Peritoneal / Ascitic Fluid Involvement:    Not submitted / unknown   REGIONAL LYMPH NODES     Regional Lymph Node Status:    Not applicable (no regional lymph nodes submitted or found)   DISTANT METASTASIS     Distant Site(s) Involved:    Diaphragm, splenic capsule   pTNM CLASSIFICATION (AJCC 8th Edition)     Reporting of pT, pN, and (when applicable) pM categories is based on information available to the pathologist at the time the report is issued. As per the AJCC (Chapter 1, 8th Ed.) it is the managing physician's responsibility to establish the final pathologic stage based upon all pertinent information, including but potentially not limited to this pathology report.     Modified Classification:    y     pT Category:    pT3c     pN Category:    pN not assigned (no nodes submitted or found)     pM Category:    pM1   FIGO STAGE     FIGO Stage:    IIIC   COMMENT:  The needle core biopsies show a heterogeneous combination of elements including abundant neural tissue with atypical and immature features. There are also atypical glandular epithelial structures and focal yolk sac elements with microcystic pattern.  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  Immunohistochemistry is performed for cytokeratin AE1/AE3, CD117, CD30, G FAP, glypican-3, OCT3/4, synaptophysin, S100 and CD30.     03/14/2022 - 05/02/2022 Chemotherapy   Patient is on Treatment Plan  : Malignant germ cell tumor of ovary D1-5 q21d     03/22/2022 Tumor Marker   Patient's tumor was tested for the following markers: AFP. Results of the tumor marker test revealed 7.9.   04/23/2022 Genetic Testing   Negative genetic testing on the CancerNext-Expanded+RNAinsight panel.  The report date is April 23, 2022.  The CancerNext-Expanded gene panel offered by [facility] and includes sequencing and rearrangement analysis for the following 77 genes: AIP, ALK, APC*, ATM*, AXIN2, BAP1, BARD1, BMPR1A, BRCA1*, BRCA2*, BRIP1*, CDC73, CDH1*, CDK4, CDKN1B, CDKN2A, CHEK2*, CTNNA1, DICER1, FH, FLCN, KIF1B, LZTR1, MAX, MEN1, MET, MLH1*, MSH2*, MSH3, MSH6*, MUTYH*, NF1*, NF2, NTHL1, PALB2*, PHOX2B, PMS2*, POT1, PRKAR1A, PTCH1, PTEN*, RAD51C*, RAD51D*, RB1, RET, SDHA, SDHAF2, SDHB, SDHC, SDHD, SMAD4, SMARCA4, SMARCB1, SMARCE1, STK11, SUFU, TMEM127, TP53*, TSC1, TSC2, and VHL (sequencing and deletion/duplication); EGFR, EGLN1, HOXB13, KIT, MITF, PDGFRA, POLD1, and POLE (sequencing  only); EPCAM and GREM1 (deletion/duplication only). DNA and RNA analyses performed for * genes.    05/17/2022 Imaging   CT ABDOMEN PELVIS W WO CONTRAST  Result Date: 05/17/2022 CLINICAL DATA:  History of ovarian cancer status post recent section and completion of chemotherapy 1-1/2 weeks ago. Evaluation of possible ileostomy reversal. EXAM: CT ABDOMEN AND PELVIS WITHOUT AND WITH CONTRAST TECHNIQUE: Multidetector CT imaging of the abdomen and pelvis was performed following the standard protocol before and following the bolus administration of intravenous contrast. RADIATION DOSE REDUCTION: This exam was performed according to the departmental dose-optimization program which includes automated exposure control, adjustment of the mA and/or kV according to patient size and/or use of iterative reconstruction technique. CONTRAST:  OMNIPAQUE IOHEXOL 300 MG/ML  SOLN COMPARISON:  CT abdomen and pelvis dated 02/09/2022 FINDINGS: Lower chest:  No focal consolidation or pulmonary nodule in the lung bases. No pleural effusion or pneumothorax demonstrated. Partially imaged heart size is normal. Hepatobiliary: Postsurgical changes of the right hepatic dome with irregular capsular retraction. Punctate adjacent focus of subdiaphragmatic calcification (11:32). Unchanged subcentimeter hypoattenuating focus along posterior segment 7 (4:18). No intra or extrahepatic biliary ductal dilation. Cholecystectomy. Pancreas: No focal lesions or main ductal dilation. Spleen: Splenectomy. Lobulated, enhancing soft tissue density along the left hemidiaphragm at the splenectomy site measures 1.5 x 1.0 cm (4:11). Adrenals/Urinary Tract: No adrenal nodules. No suspicious renal mass or hydronephrosis. Punctate nonobstructing left lower pole stones. No focal bladder wall thickening. Stomach/Bowel: Normal appearance of the stomach. Right upper quadrant ileostomy. Enteric contrast material is present within the medial limb of the ileostomy. Appendectomy. Vascular/Lymphatic: Aortic atherosclerosis. No enlarged abdominal or pelvic lymph nodes. Reproductive: Hysterectomy and bilateral salpingo-oophorectomy. Ill-defined soft tissue near the left vaginal (4:66). No adnexal masses. Other: Status post omentectomy. Ill-defined lateral peritoneal thickening, for example series 4 image 38 on the right and image 36 on the left associated with scattered foci of mesenteric nodularity, for example 7 x 5 mm (4:34). Musculoskeletal: No acute or abnormal lytic or blastic osseous lesions. Postsurgical changes of the anterior abdominal wall. IMPRESSION: 1. Extensive postsurgical changes of the abdomen and pelvis. Right upper quadrant ileostomy with enteric contrast material present within the medial limb of the ileostomy. No evidence of obstruction. 2. Multifocal ill-defined soft tissue densities as described, indeterminate, may reflect postsurgical change versus residual tumor. 3. Lobulated,  enhancing soft tissue density along the left hemidiaphragm at the splenectomy site may represent residual/recurrent splenic tissue versus residual tumor, although the enhancement pattern does not appear consistent with pre-surgical appearance of the malignancy. 4.  Aortic Atherosclerosis (ICD10-I70.0). Electronically Signed   By: Agustin Cree M.D.   On: 05/17/2022 16:43      06/09/2022 Imaging   CT ABDOMEN PELVIS WO CONTRAST  Result Date: 06/09/2022 CLINICAL DATA:  Ovarian cancer, status post chemotherapy. Rectal contrast administered to evaluate for anastomotic patency prior to reversal. EXAM: CT ABDOMEN AND PELVIS WITHOUT CONTRAST TECHNIQUE: Multidetector CT imaging of the abdomen and pelvis was performed following the standard protocol without IV contrast. RADIATION DOSE REDUCTION: This exam was performed according to the departmental dose-optimization program which includes automated exposure control, adjustment of the mA and/or kV according to patient size and/or use of iterative reconstruction technique. COMPARISON:  05/17/2022 FINDINGS: Lower chest: Lung bases are clear. Hepatobiliary: Unenhanced liver is unremarkable. Status post cholecystectomy. No intrahepatic or extrahepatic ductal dilatation. Pancreas: Within normal limits. 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 Adrenals/Urinary Tract: Adrenal glands are within normal limits. Right kidney  is within normal limits. 3 mm nonobstructing left lower pole renal calculus (series 2/image 22). No hydronephrosis. Bladder is within normal limits. Stomach/Bowel: Stomach is within normal limits. No evidence of bowel obstruction. 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 Status post left hemicolectomy with suture line in the lower pelvis (series 2/image 87). Rectal contrast confirms a patent anastomosis without evidence of leak. Vascular/Lymphatic: No evidence of abdominal aortic aneurysm. Atherosclerotic calcifications of the  abdominal aorta and branch vessels. No suspicious abdominopelvic lymphadenopathy. Reproductive: Status post hysterectomy and suspected bilateral salpingo oophorectomy. Other: No abdominopelvic ascites. Mild peritoneal nodularity along the lateral left mid abdomen, with discrete 8 mm nodule (series 2/image 33), more conspicuous than on the prior. Mild nonspecific mesenteric stranding along the jejunal mesentery in central left mid abdomen (series 2/image 30). Mildly prominent soft tissue along the left pelvic cul-de-sac measuring up to 2.0 cm (series 2/image 59), grossly unchanged. Overall appearance suggests very mild peritoneal disease which is overall grossly unchanged. Musculoskeletal: Visualized osseous structures are within normal limits. 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 Rectal contrast confirms a patent anastomosis without evidence of leak. 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 Additional ancillary findings as above. Electronically Signed   By: Charline Bills M.D.   On: 06/09/2022 01:59   CT ABDOMEN PELVIS W WO CONTRAST  Result Date: 05/17/2022 CLINICAL DATA:  History of ovarian cancer status post recent section and completion of chemotherapy 1-1/2 weeks ago. Evaluation of possible ileostomy reversal. EXAM: CT ABDOMEN AND PELVIS WITHOUT AND WITH CONTRAST TECHNIQUE: Multidetector CT imaging of the abdomen and pelvis was performed following the standard protocol before and following the bolus administration of intravenous contrast. RADIATION DOSE REDUCTION: This exam was performed according to the departmental dose-optimization program which includes automated exposure control, adjustment of the mA and/or kV according to patient size and/or use of iterative reconstruction technique. CONTRAST:  OMNIPAQUE IOHEXOL 300 MG/ML  SOLN COMPARISON:  CT abdomen and pelvis dated 02/09/2022 FINDINGS: Lower chest:  No focal consolidation or pulmonary nodule in the lung bases. No pleural effusion or pneumothorax demonstrated. Partially imaged heart size is normal. Hepatobiliary: Postsurgical changes of the right hepatic dome with irregular capsular retraction. Punctate adjacent focus of subdiaphragmatic calcification (11:32). Unchanged subcentimeter hypoattenuating focus along posterior segment 7 (4:18). No intra or extrahepatic biliary ductal dilation. Cholecystectomy. Pancreas: No focal lesions or main ductal dilation. Spleen: Splenectomy. Lobulated, enhancing soft tissue density along the left hemidiaphragm at the splenectomy site measures 1.5 x 1.0 cm (4:11). Adrenals/Urinary Tract: No adrenal nodules. No suspicious renal mass or hydronephrosis. Punctate nonobstructing left lower pole stones. No focal bladder wall thickening. Stomach/Bowel: Normal appearance of the stomach. Right upper quadrant ileostomy. Enteric contrast material is present within the medial limb of the ileostomy. Appendectomy. Vascular/Lymphatic: Aortic atherosclerosis. No enlarged abdominal or pelvic lymph nodes. Reproductive: Hysterectomy and bilateral salpingo-oophorectomy. Ill-defined soft tissue near the left vaginal (4:66). No adnexal masses. Other: Status post omentectomy. Ill-defined lateral peritoneal thickening, for example series 4 image 38 on the right and image 36 on the left associated with scattered foci of mesenteric nodularity, for example 7 x 5 mm (4:34). Musculoskeletal: No acute or abnormal lytic or blastic osseous lesions. Postsurgical changes of the anterior abdominal wall. IMPRESSION: 1. Extensive postsurgical changes of the abdomen and pelvis. Right upper quadrant ileostomy with enteric contrast material present within the medial limb of the ileostomy. No evidence of obstruction. 2. Multifocal ill-defined soft tissue densities as described, indeterminate,  may reflect postsurgical change versus residual tumor. 3. Lobulated,  enhancing soft tissue density along the left hemidiaphragm at the splenectomy site may represent residual/recurrent splenic tissue versus residual tumor, although the enhancement pattern does not appear consistent with pre-surgical appearance of the malignancy. 4.  Aortic Atherosclerosis (ICD10-I70.0). Electronically Signed   By: Agustin Cree M.D.   On: 05/17/2022 16:43      07/01/2022 Surgery   Pre-operative Diagnosis: Ileostomy, planned reversal   Post-operative Diagnosis: same as above   Operation: Ileostomy takedown   Surgeon: Eugene Garnet MD    Operative Findings: Normal-appearing loop ileostomy with some mild retraction.  Some filmy adhesions around the loop of ileum itself and its mesentery.  Fascia easily identified and normal in appearance.  Ileum freed from surrounding anterior abdominal wall peritoneum.     08/09/2022 Tumor Marker   Patient's tumor was tested for the following markers: CA-125. Results of the tumor marker test revealed 12.6.   08/10/2022 Tumor Marker   Patient's tumor was tested for the following markers: AFP. Results of the tumor marker test revealed 3.     PHYSICAL EXAMINATION: ECOG PERFORMANCE STATUS: 0 - Asymptomatic  Vitals:   12/08/22 1224  BP: 133/75  Pulse: 98  Resp: 18  Temp: 99 F (37.2 C)  SpO2: 99%   Filed Weights   12/08/22 1224  Weight: 124 lb (56.2 kg)    GENERAL:alert, no distress and comfortable SKIN: skin color, texture, turgor are normal, no rashes or significant lesions EYES: normal, Conjunctiva are pink and non-injected, sclera clear OROPHARYNX:no exudate, no erythema and lips, buccal mucosa, and tongue normal  NECK: supple, thyroid normal size, non-tender, without nodularity LYMPH:  no palpable lymphadenopathy in the cervical, axillary or inguinal LUNGS: clear to auscultation and percussion with normal breathing effort HEART: regular rate & rhythm and no murmurs and no lower extremity edema ABDOMEN:abdomen soft,  non-tender and normal bowel sounds Musculoskeletal:no cyanosis of digits and no clubbing  NEURO: alert & oriented x 3 with fluent speech, no focal motor/sensory deficits  LABORATORY DATA:  I have reviewed the data as listed    Component Value Date/Time   NA 139 12/01/2022 1254   K 4.0 12/01/2022 1254   CL 106 12/01/2022 1254   CO2 26 12/01/2022 1254   GLUCOSE 117 (H) 12/01/2022 1254   BUN 18 12/01/2022 1254   CREATININE 0.77 12/01/2022 1254   CREATININE 0.57 04/18/2022 1202   CREATININE 0.75 03/23/2011 1550   CALCIUM 9.7 12/01/2022 1254   PROT 7.0 12/01/2022 1254   ALBUMIN 4.4 12/01/2022 1254   AST 19 12/01/2022 1254   AST 38 04/18/2022 1202   ALT 23 12/01/2022 1254   ALT 28 04/18/2022 1202   ALKPHOS 61 12/01/2022 1254   BILITOT 0.4 12/01/2022 1254   BILITOT 0.2 (L) 04/18/2022 1202   GFRNONAA >60 12/01/2022 1254   GFRNONAA >60 04/18/2022 1202    No results found for: "SPEP", "UPEP"  Lab Results  Component Value Date   WBC 10.8 (H) 12/01/2022   NEUTROABS 7.8 (H) 12/01/2022   HGB 12.8 12/01/2022   HCT 38.0 12/01/2022   MCV 97.9 12/01/2022   PLT 354 12/01/2022      Chemistry      Component Value Date/Time   NA 139 12/01/2022 1254   K 4.0 12/01/2022 1254   CL 106 12/01/2022 1254   CO2 26 12/01/2022 1254   BUN 18 12/01/2022 1254   CREATININE 0.77 12/01/2022 1254   CREATININE 0.57 04/18/2022 1202  CREATININE 0.75 03/23/2011 1550      Component Value Date/Time   CALCIUM 9.7 12/01/2022 1254   ALKPHOS 61 12/01/2022 1254   AST 19 12/01/2022 1254   AST 38 04/18/2022 1202   ALT 23 12/01/2022 1254   ALT 28 04/18/2022 1202   BILITOT 0.4 12/01/2022 1254   BILITOT 0.2 (L) 04/18/2022 1202

## 2022-12-08 NOTE — Assessment & Plan Note (Signed)
Clinically, her examination is benign I am delighted to see if she is gaining healthy weight Tumor markers were negative She is scheduled for repeat labs and CT imaging in December I will see her back in January for further follow-up

## 2022-12-27 NOTE — Progress Notes (Unsigned)
[name] [address] [place] [address] Phone: [phone] Subjective:   [name], am serving as a scribe for Dr. Antoine Primas.  I'm seeing this patient by the request  of:  Patient, No Pcp Per  CC: Back and neck pain follow-up more hip pain  [name] is a 52 y.o. female coming in with complaint of back and neck pain. OMT 11/23/2022. Patient states that she is having R hip pain in the lateral and medial aspect with hip ER.   Medications patient has been prescribed: None  Taking:         Reviewed prior external information including notes and imaging from previsou exam, outside providers and external EMR if available.   As well as notes that were available from care everywhere and other healthcare systems.  Past medical history, social, surgical and family history all reviewed in electronic medical record.  No pertanent information unless stated regarding to the chief complaint.   Past Medical History:  Diagnosis Date   Arthritis    Endometriosis    Family history of breast cancer    Family history of breast cancer in mother    at age 45   GAD (generalized anxiety disorder)    lexapro helped 2017 (Dr. Emmit Alexanders did have extra stress of the Master's degree program on her at that time.  Got counseling.       GERD (gastroesophageal reflux disease)    Gestational diabetes    History of kidney stones    Low back pain    Dr. Terrilee Files.   Neck pain    and left shoulder pain: managed by osteopathic manipulation by Dr. Terrilee Files. ?cervical radiculopathy   ovarian ca 12/01/2021   PAC (premature atrial contraction)    PONV (postoperative nausea and vomiting)     Allergies  Allergen Reactions   Latex Other (See Comments)    Sensitivity only   Sulfa Antibiotics Hives and Other (See Comments)   Sulfamethoxazole-Trimethoprim Rash   Septra [Bactrim] Rash     Review of Systems:  No headache, visual  changes, nausea, vomiting, diarrhea, constipation, dizziness, abdominal pain, skin rash, fevers, chills, night sweats, weight loss, swollen lymph nodes,  joint swelling, chest pain, shortness of breath, mood changes. POSITIVE muscle aches, body aches  Objective  Blood pressure 108/78, pulse 67, height 4\' 11"  (1.499 m), weight 123 lb (55.8 kg), SpO2 99%.   General: No apparent distress alert and oriented x3 mood and affect normal, dressed appropriately.  HEENT: Pupils equal, extraocular movements intact  Respiratory: Patient's speak in full sentences and does not appear short of breath  Cardiovascular: No lower extremity edema, non tender, no erythema  Gait MSK:  Back does have some loss lordosis noted.  Tightness noted as well.  Tender mostly over the greater trochanteric area.  Patient does have some negative straight leg test.  Tightness in the back as well.  Osteopathic findings  C3 flexed rotated and side bent right C7 flexed rotated and side bent left T3 extended rotated and side bent right inhaled rib T7 extended rotated and side bent left L2 flexed rotated and side bent right Sacrum right on right       Assessment and Plan:  Greater trochanteric bursitis of right hip Pain seems to be on the lateral aspect of the hip.  Consistent with a greater trochanteric bursitis that is likely exacerbated with patient doing more walking especially with hills.  We  discussed augmenting some of the exercises, home exercises and topical anti-inflammatories and icing regimen.  Past medical history is significant for cancer but is going to have a CT scan in the near future.  We will hold on the x-ray secondary to this.  Will reevaluate at when she has a CT scan.  Follow-up with me again in 6 to 8 weeks    Nonallopathic problems  Decision today to treat with OMT was based on Physical Exam  After verbal consent patient was treated with HVLA, ME, FPR techniques in cervical, rib, thoracic, lumbar,  and sacral  areas  Patient tolerated the procedure well with improvement in symptoms  Patient given exercises, stretches and lifestyle modifications  See medications in patient instructions if given  Patient will follow up in 4-8 weeks     The above documentation has been reviewed and is accurate and complete Linda Saa, DO         Note: This dictation was prepared with Dragon dictation along with smaller phrase technology. Any transcriptional errors that result from this process are unintentional.

## 2022-12-28 ENCOUNTER — Encounter: Payer: Self-pay | Admitting: Family Medicine

## 2022-12-28 ENCOUNTER — Ambulatory Visit (INDEPENDENT_AMBULATORY_CARE_PROVIDER_SITE_OTHER): Payer: 59 | Admitting: Family Medicine

## 2022-12-28 VITALS — BP 108/78 | HR 67 | Ht 59.0 in | Wt 123.0 lb

## 2022-12-28 DIAGNOSIS — M7061 Trochanteric bursitis, right hip: Secondary | ICD-10-CM

## 2022-12-28 DIAGNOSIS — M9901 Segmental and somatic dysfunction of cervical region: Secondary | ICD-10-CM

## 2022-12-28 DIAGNOSIS — M9902 Segmental and somatic dysfunction of thoracic region: Secondary | ICD-10-CM

## 2022-12-28 DIAGNOSIS — M9904 Segmental and somatic dysfunction of sacral region: Secondary | ICD-10-CM

## 2022-12-28 DIAGNOSIS — M9903 Segmental and somatic dysfunction of lumbar region: Secondary | ICD-10-CM

## 2022-12-28 DIAGNOSIS — M9908 Segmental and somatic dysfunction of rib cage: Secondary | ICD-10-CM

## 2022-12-28 DIAGNOSIS — M25551 Pain in right hip: Secondary | ICD-10-CM

## 2022-12-28 NOTE — Patient Instructions (Addendum)
Walk on flat surfaces only Ice after activity See me in 6-8 weeks

## 2022-12-28 NOTE — Assessment & Plan Note (Signed)
Pain seems to be on the lateral aspect of the hip.  Consistent with a greater trochanteric bursitis that is likely exacerbated with patient doing more walking especially with hills.  We discussed augmenting some of the exercises, home exercises and topical anti-inflammatories and icing regimen.  Past medical history is significant for cancer but is going to have a CT scan in the near future.  We will hold on the x-ray secondary to this.  Will reevaluate at when she has a CT scan.  Follow-up with me again in 6 to 8 weeks

## 2023-01-09 ENCOUNTER — Other Ambulatory Visit (HOSPITAL_COMMUNITY): Payer: Self-pay

## 2023-01-19 ENCOUNTER — Inpatient Hospital Stay: Payer: 59 | Attending: Gynecologic Oncology

## 2023-01-19 ENCOUNTER — Ambulatory Visit (HOSPITAL_COMMUNITY)
Admission: RE | Admit: 2023-01-19 | Discharge: 2023-01-19 | Disposition: A | Discharge status: Home / Self Care | Payer: 59 | Source: Ambulatory Visit | Attending: Gynecologic Oncology | Admitting: Gynecologic Oncology

## 2023-01-19 ENCOUNTER — Encounter: Payer: Self-pay | Admitting: Family Medicine

## 2023-01-19 DIAGNOSIS — R971 Elevated cancer antigen 125 [CA 125]: Secondary | ICD-10-CM | POA: Diagnosis not present

## 2023-01-19 DIAGNOSIS — C569 Malignant neoplasm of unspecified ovary: Secondary | ICD-10-CM | POA: Insufficient documentation

## 2023-01-19 DIAGNOSIS — Z8543 Personal history of malignant neoplasm of ovary: Secondary | ICD-10-CM | POA: Insufficient documentation

## 2023-01-19 DIAGNOSIS — Z9221 Personal history of antineoplastic chemotherapy: Secondary | ICD-10-CM | POA: Insufficient documentation

## 2023-01-19 DIAGNOSIS — Z79890 Hormone replacement therapy (postmenopausal): Secondary | ICD-10-CM | POA: Insufficient documentation

## 2023-01-19 LAB — CBC WITH DIFFERENTIAL/PLATELET
Abs Immature Granulocytes: 0.03 10*3/uL (ref 0.00–0.07)
Basophils Absolute: 0.1 10*3/uL (ref 0.0–0.1)
Basophils Relative: 1 %
Eosinophils Absolute: 0 10*3/uL (ref 0.0–0.5)
Eosinophils Relative: 0 %
HCT: 40.5 % (ref 36.0–46.0)
Hemoglobin: 13.7 g/dL (ref 12.0–15.0)
Immature Granulocytes: 0 %
Lymphocytes Relative: 13 %
Lymphs Abs: 1.3 10*3/uL (ref 0.7–4.0)
MCH: 32.9 pg (ref 26.0–34.0)
MCHC: 33.8 g/dL (ref 30.0–36.0)
MCV: 97.4 fL (ref 80.0–100.0)
Monocytes Absolute: 0.8 10*3/uL (ref 0.1–1.0)
Monocytes Relative: 8 %
Neutro Abs: 8 10*3/uL — ABNORMAL HIGH (ref 1.7–7.7)
Neutrophils Relative %: 78 %
Platelets: 345 10*3/uL (ref 150–400)
RBC: 4.16 MIL/uL (ref 3.87–5.11)
RDW: 12.4 % (ref 11.5–15.5)
WBC: 10.2 10*3/uL (ref 4.0–10.5)
nRBC: 0 % (ref 0.0–0.2)

## 2023-01-19 LAB — COMPREHENSIVE METABOLIC PANEL
ALT: 23 U/L (ref 0–44)
AST: 19 U/L (ref 15–41)
Albumin: 4.6 g/dL (ref 3.5–5.0)
Alkaline Phosphatase: 61 U/L (ref 38–126)
Anion gap: 8 (ref 5–15)
BUN: 20 mg/dL (ref 6–20)
CO2: 26 mmol/L (ref 22–32)
Calcium: 9.6 mg/dL (ref 8.9–10.3)
Chloride: 104 mmol/L (ref 98–111)
Creatinine, Ser: 0.81 mg/dL (ref 0.44–1.00)
GFR, Estimated: >60 mL/min (ref >60)
Glucose, Bld: 103 mg/dL — ABNORMAL HIGH (ref 70–99)
Potassium: 3.9 mmol/L (ref 3.5–5.1)
Sodium: 138 mmol/L (ref 135–145)
Total Bilirubin: 0.4 mg/dL (ref <1.2)
Total Protein: 7.1 g/dL (ref 6.5–8.1)

## 2023-01-19 MED ORDER — IOHEXOL 300 MG/ML  SOLN
30.0000 mL | Freq: Once | INTRAMUSCULAR | Status: AC | PRN
  Administered 2023-01-19: 30 mL via ORAL

## 2023-01-19 MED ORDER — SODIUM CHLORIDE 0.9% FLUSH
10.0000 mL | Freq: Once | INTRAVENOUS | Status: AC | PRN
  Administered 2023-01-19: 10 mL

## 2023-01-19 MED ORDER — IOHEXOL 300 MG/ML  SOLN
100.0000 mL | Freq: Once | INTRAMUSCULAR | Status: AC | PRN
  Administered 2023-01-19: 100 mL via INTRAVENOUS

## 2023-01-19 MED ORDER — HEPARIN SOD (PORK) LOCK FLUSH 100 UNIT/ML IV SOLN
500.0000 [IU] | Freq: Once | INTRAVENOUS | Status: AC
  Administered 2023-01-19: 500 [IU] via INTRAVENOUS

## 2023-01-20 ENCOUNTER — Encounter: Payer: Self-pay | Admitting: Hematology and Oncology

## 2023-01-20 LAB — AFP TUMOR MARKER: AFP, Serum, Tumor Marker: 4.1 ng/mL (ref 0.0–9.2)

## 2023-01-20 LAB — BETA HCG QUANT (REF LAB): hCG Quant: 1 m[IU]/mL

## 2023-01-20 LAB — CA 125: Cancer Antigen (CA) 125: 11.7 U/mL (ref 0.0–38.1)

## 2023-01-22 ENCOUNTER — Encounter: Payer: Self-pay | Admitting: Gynecologic Oncology

## 2023-01-22 DIAGNOSIS — K769 Liver disease, unspecified: Secondary | ICD-10-CM

## 2023-01-22 DIAGNOSIS — C569 Malignant neoplasm of unspecified ovary: Secondary | ICD-10-CM

## 2023-01-23 ENCOUNTER — Ambulatory Visit (HOSPITAL_COMMUNITY)
Admission: RE | Admit: 2023-01-23 | Discharge: 2023-01-23 | Disposition: A | Discharge status: Home / Self Care | Payer: 59 | Source: Ambulatory Visit | Attending: Gynecologic Oncology | Admitting: Gynecologic Oncology

## 2023-01-23 ENCOUNTER — Encounter: Payer: Self-pay | Admitting: Gynecologic Oncology

## 2023-01-23 ENCOUNTER — Telehealth: Payer: Self-pay | Admitting: *Deleted

## 2023-01-23 DIAGNOSIS — K769 Liver disease, unspecified: Secondary | ICD-10-CM | POA: Diagnosis present

## 2023-01-23 DIAGNOSIS — C569 Malignant neoplasm of unspecified ovary: Secondary | ICD-10-CM | POA: Diagnosis present

## 2023-01-23 MED ORDER — GADOBUTROL 1 MMOL/ML IV SOLN
7.0000 mL | Freq: Once | INTRAVENOUS | Status: AC | PRN
  Administered 2023-01-23: 7 mL via INTRAVENOUS

## 2023-01-23 NOTE — Telephone Encounter (Signed)
Per Dr Pricilla Holm scheduled the patient for a a MRI today at 2:30 pm, patient aware of date/time and instructions (NPO now)

## 2023-01-23 NOTE — Progress Notes (Signed)
I called the patient to discuss recent CT findings. Given possible small, new lesion in the liver, plan for MRI of the abdomen with and without contrast. Order placed. I've asked the clinic staff to get this scheduled ASAP. Patient aware.  Carin Hock MD

## 2023-01-24 ENCOUNTER — Telehealth: Payer: Self-pay | Admitting: Gynecologic Oncology

## 2023-01-24 NOTE — Telephone Encounter (Signed)
Spoke with the patient yesterday about CT results. Given possible new liver lesion, MRI abd ordered. This was able to be scheduled yesterday (12/9). Called patient with normal results of the MRI.  Eugene Garnet MD Gynecologic Oncology

## 2023-01-27 DIAGNOSIS — C569 Malignant neoplasm of unspecified ovary: Principal | ICD-10-CM

## 2023-01-31 ENCOUNTER — Other Ambulatory Visit (HOSPITAL_BASED_OUTPATIENT_CLINIC_OR_DEPARTMENT_OTHER): Payer: Self-pay

## 2023-02-10 ENCOUNTER — Inpatient Hospital Stay (HOSPITAL_BASED_OUTPATIENT_CLINIC_OR_DEPARTMENT_OTHER): Payer: 59 | Admitting: Gynecologic Oncology

## 2023-02-10 ENCOUNTER — Encounter: Payer: Self-pay | Admitting: Gynecologic Oncology

## 2023-02-10 VITALS — BP 122/78 | HR 85 | Temp 97.9°F | Resp 18 | Ht 59.0 in | Wt 127.8 lb

## 2023-02-10 DIAGNOSIS — Z8543 Personal history of malignant neoplasm of ovary: Secondary | ICD-10-CM

## 2023-02-10 DIAGNOSIS — Z79890 Hormone replacement therapy (postmenopausal): Secondary | ICD-10-CM | POA: Diagnosis not present

## 2023-02-10 DIAGNOSIS — N951 Menopausal and female climacteric states: Secondary | ICD-10-CM

## 2023-02-10 DIAGNOSIS — R971 Elevated cancer antigen 125 [CA 125]: Secondary | ICD-10-CM | POA: Diagnosis not present

## 2023-02-10 DIAGNOSIS — C569 Malignant neoplasm of unspecified ovary: Secondary | ICD-10-CM

## 2023-02-10 DIAGNOSIS — Z9221 Personal history of antineoplastic chemotherapy: Secondary | ICD-10-CM | POA: Diagnosis not present

## 2023-02-10 NOTE — Progress Notes (Signed)
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 21.1 by 10.9 by 18.7 cm abdominopelvic mass, with a satellite mass in the right upper quadrant anteriorly, and moderate ascites with tumor deposits along the pelvic ascites posteriorly. The mass could be arising from right or left ovary, or both. The mass displaces the bowel and IVC, but there is no IVC thrombus or pelvic DVT identified. 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 2. Additional satellite mass above the liver is partially included, along with tumor deposits along the ascites. 3. Low-level edema along the lateral abdominal wall musculature. 4. Flattening of the IVC at the level of the aortic bifurcation due to the large abdominopelvic tumor. No findings of pelvic DVT.     12/07/2021 Imaging   CT abdomen and pelvis 1. Large central abdominal and pelvic mass consistent with known malignancy. This is incompletely visualized by this CT of the abdomen, but was seen on recent pelvic MRI. 2. 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 Right omental implant consistent with metastatic disease. Small amount of abdominal ascites. 3. No evidence for bowel or ureteral obstruction. 4. Aortic Atherosclerosis (ICD10-I70.0). 5. Chest findings dictated separately.   12/07/2021 Imaging   CT chest 1. Negative for pulmonary embolism. And no acute or metastatic process identified in the Chest. 2. Partially visible liver mass and ascites, staging CT Abdomen today is reported separately.     12/07/2021 Procedure    Successful placement of a right internal jugular approach power injectable Port-A-Cath. The catheter is ready for immediate use.   12/07/2021 Procedure   Ultrasound-guided biopsy of right upper quadrant mass.    12/08/2021 Initial Diagnosis   Ovarian cancer (HCC)   12/08/2021 Cancer Staging   Staging form: Ovary, Fallopian Tube, and Primary Peritoneal Carcinoma, AJCC 8th Edition - Clinical stage from 12/08/2021: FIGO Stage IIIC (cT3c, cN0, cM0) - Signed by [name], MD on 12/10/2021 Stage prefix: Initial diagnosis   12/20/2021 - 12/28/2021 Chemotherapy   Patient is on Treatment Plan : Ovarian germ cell tumor q21d x 3 Cycles     01/03/2022 Imaging   IMPRESSION: 1. Large central abdominopelvic mass compatible with malignancy, 3450 cubic cm in volume. This may be arising from the ovaries with slight eccentricity to the right in the pelvis. 2. Mild increase in size of the right omental tumor deposit. 3. Mild increase in size of the subcapsular mass along the dome of the right hepatic lobe, with suspected hepatic invasion. 4. Scattered malignant ascites. 5. No dilated bowel to suggest obstruction. 6. 2 mm left kidney lower pole nonobstructive renal calculus. 7. Mild abdominal aortic atherosclerotic vascular disease.   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  Postoperative Diagnosis: Status post R0 cytoreductive surgery and heating intraperitoneal chemotherapy  Procedures: LAPAROSCOPY, ABDOMEN, PERITONEUM, & OMENTUM, DIAGNOSTIC, W/WO COLLECTION SPECIMEN(S) BY BRUSHING OR WASHING  EXCISION/DESTRUCTION, OPEN, INTRA-ABD TUMOR/CYST/ENDOMETRIOMA, 1+ PERITONEAL/MESENTERI/RETROPERIT 5CM OR <COLECTOMY, PARTIAL; WITH ANASTOMOSIS COLOSTOMY OR SKIN LEVEL CECOSTOMY SPLENECTOMY; TOT (SEPART PROC) EXPLORATORY LAPAROTOMY, EXPLORATORY CELIOTOMY WITH OR WITHOUT BIOPSY(S)  HYPERTHERMIA, EXTERNALLY GENERATED;  DEEP CHEMOTHERAPY ADMINISTRATION INTO THE PERITONEAL CAVITY VIA INDWELLING PORT OR CATHETER RESECTION (INITIAL) OVARIAN, TUBAL/PRIM PERITONEAL MALIG W/BIL S&O/OMENTECT; W/RAD DISSECTION FOR DEBULKING CHOLECYSTECTOMY APPENDECTOMY OMENTECTOMY, EPIPLOECTOMY, RESECTION OF OMENTUM Procedures: Exploratory laparotomy, total abdominal hysterectomy with radical dissection, bilateral ureterolysis, bilateral salpingo-oophorectomy, posterior cul-de-sac peritonectomy, tumor debulking by Dr Pricilla Holm with Gynecology Oncology Dr Vanessa Ralphs with surgical oncology: Diagnostic laparoscopy, exploratory laparotomy, low anterior resection with primary end-to-end anastomosis, enterolysis, cholecystectomy, tumor capsule tumor debulking, splenectomy, appendectomy, omentectomy, diverting loop ileostomy formation  Surgeon: Eugene Garnet, MD  Findings: On diagnostic laparoscopy, only able to clearly visualize the left upper quadrant. Adhesions of bowel in the right upper quadrant limiting survey, minimal ascites noted in the pelvis, scattered tumor plaques visualized on left diaphragm. Given these findings, the decision was made to convert to exploratory laparotomy. Multiple loops of small bowel encased in a Cancn like structure in the midline with multiple filmy adhesions tethering to the superior aspect of the a large pelvic mass. Multicystic and necrotic pelvic mass originating from left ovary, approximately 18 x 20 cm incorporated within the left broad ligament with the left tube draped overlying the left ovarian mass. Bilateral tubes normal in appearance, normal-appearing uterus with smooth serosa. Right adnexa similarly with approximate 10 x 8 multicystic and necrotic mass. Diffuse tumor plaques encompassing posterior cul-de-sac peritoneum. Rectosigmoid densely adhered to posterior aspect of left adnexal mass requiring dissection and ultimately resection. Scattered treated subcentimeter nodules along small bowel mesentery. 5 x  5 cm necrotic and multicystic tumor at the level of the hepatic flexure. Enlarged similar multicystic necrotic tumor adherent between the right diaphragm and the liver without any evidence of liver invasion and tumor encased solely within the liver capsule. Multiple tumor nodules along the spleen requiring splenectomy. Omentum overall fairly normal in appearance with only scattered small tumor deposits, no omental cake was encountered. Please see surgical oncology operative note for further details regarding operative findings for their portion of the procedure.  Specimens: ID Type Source Tests Collected by Time Destination 1 : Abdominal fat pad Tissue Abdomen SURGICAL PATHOLOGY EXAM Marion Downer, MD 02/15/[phone] 2 : Left adnexa Tissue Abdomen SURGICAL PATHOLOGY EXAM Marion Downer, MD 02/15/2022 [phone] : right adnexa Tissue Abdomen SURGICAL PATHOLOGY EXAM Marion Downer, MD 02/15/2022 [phone] 4 : Left perimetrium Tissue Uterus SURGICAL PATHOLOGY EXAM Carver Fila, MD 02/15/2022 [phone] : Uterus and cervix Tissue Uterus SURGICAL PATHOLOGY EXAM Marion Downer, MD 02/15/2022 1018 6 : Low Anterior Resection Tissue Abdomen SURGICAL PATHOLOGY EXAM Marion Downer, MD 02/15/2022 1055 7 : Right peritoneum Tissue Abdomen SURGICAL PATHOLOGY EXAM Marion Downer, MD 02/15/2022 1058 8 : Right retroperitoneal mass Tissue Abdomen SURGICAL PATHOLOGY EXAM Marion Downer, MD 02/15/2022 1101 9 : Omentum Tissue Omentum SURGICAL PATHOLOGY EXAM Marion Downer, MD 02/15/2022 1130 10 : Gallbladder Tissue Gallbladder SURGICAL PATHOLOGY EXAM Marion Downer, MD 02/15/2022 1143 11 : Right diaphragm Tissue Diaphragm SURGICAL PATHOLOGY EXAM Marion Downer, MD 02/15/2022 1143 12 : Left diaphragm nodule Tissue Diaphragm SURGICAL PATHOLOGY EXAM Marion Downer, MD 02/15/2022 1157 13 : Spleen Tissue Spleen SURGICAL PATHOLOGY EXAM Marion Downer, MD 02/15/2022 1204 14 : Appendix Tissue Appendix SURGICAL PATHOLOGY EXAM Marion Downer, MD 02/15/2022 1213 15 : Small bowel mesenteric nodules Tissue Small Bowel SURGICAL PATHOLOGY EXAM Marion Downer, MD 02/15/2022 1215 16 : Colon mesenteric nodule Tissue Colon SURGICAL PATHOLOGY EXAM Marion Downer, MD 02/15/2022 1221 17 : Hepatic flexure mass  Tissue Liver SURGICAL PATHOLOGY EXAM Marion Downer, MD 02/15/2022 1227  Complications: None  Indications for Procedure: Alazay Wandell is a 52 y.o. 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 Prior to the procedure, all risks, benefits, and alternatives were discussed and informed surgical consent was signed.  Procedure: Patient was taken to the operating room where general anesthesia was achieved. She was positioned in dorsal lithotomy and prepped and draped per the surgical oncology team. A foley catheter was inserted into the bladder. A 5 mm skin incision was made in the left upper quadrant at Palmer's point, the abdomen was then entered with direct Optiview entry and the abdomen was insufflated. Findings were noted as above.  The laparoscope was then removed from the abdomen, the pneumoperitoneum was maintained. A vertical midline incision was made with the scalpel and the abdomen was entered sharply. The abdomen and pelvis were surveyed with findings as documented above. A wound protector was placed followed by the [facility] retractor.  The enlarged left adnexal mass was then carefully removed of its bilateral sidewall attachments using a mixture of blunt and cautery dissection. Multiple small bowel loops were adhered to the superior and posterior aspect of the mass with filmy adhesions which were carefully bluntly resected. The rectosigmoid was noted to be densely adhered to the posterior aspect of the pelvic mass.  Dissection was attempted using mixture of blunt dissection as well as sharp dissection with the Metzenbaum scissors, and unavoidable enterotomy of approximately 4 cm was made to the anterior surface of the rectosigmoid. This enterotomy was then closed with 4-0 PDS in a running fashion to minimize contamination. Further dissection removing the remainder of the rectosigmoid from the posterior aspect of the pelvic mass was then undertaken. The pelvic mass was then able to be fully elevated from the pelvis. The left infundibulopelvic ligament was able to be isolated with the left ureter clearly visualized below. The left ureter was further dissected away from the medial leaf of the broad ligament, and a vessel loop was placed. The left infundibulopelvic ligament was then isolated, clamped, and transected using the LigaSure device as well as and suture-ligated. The left utero-ovarian ligament was then isolated, clamped, cauterized and transected. The left pelvic mass was then removed entirely.  Attention was then turned to the right. An additional enlarged multicystic right adnexal mass was noted, this was carefully elevated from the pelvis using blunt dissection. The right round ligament was then grasped, elevated, and transected. The broad ligament was then opened posteriorly. The ureter was identified and the right infundibulopelvic ligament was then isolated, clamped, transected, and tied. The right utero ovarian ligament was then isolated, clamped, and transected using the LigaSure device and the right adnexal mass was removed.  Attention then was turned toward the hysterectomy. A sponge stick was placed within the vagina. The right broad ligament dissection was opened anteriorly and the bladder flap was developed. The left round ligament was then further transected, and the broad ligament opened posteriorly and anteriorly to complete the bladder flap. The right uterine artery was skeletonized, clamped,  transected, and suture-ligated. There was significant parametrial tumor involvement on the left side, and therefore a radical dissection was required to isolate the blood supply. The left ureter was further dissected down to the level near the trigone. The left uterine artery was traced to its origin, isolated, and clamped with vascular clips. Similarly the left uterine  vein due to significant necrotic tumor involvement was friable. Detachment was similarly carefully isolated, and clipped with vascular clips. Sequential clamps were then used to transect the remainder of the broad and cardinal ligaments bilaterally, with each pedicle being suture-ligated. A large right angle clamp was then able to be placed below the cervix, and the uterus and cervix were amputated from the vagina. The vaginal cuff was then closed with several figure-of-eight sutures using 0 Vicryl.    02/15/2022 Pathology Results   A: Abdominal fat pad, excision - Fibroadipose tissue with residual mixed malignant germ cell tumor with treatment effect   B: Left adnexa, salpingo-oophorectomy - Residual mixed malignant germ cell tumor with treatment effect, [id number] (See Comment and Synoptic report) - FIGO stage IIIC - Fallopian tubes with no tumor present   C: Right adnexa, salpingo-oophorectomy - Residual mixed malignant germ cell tumor with treatment effect (See Synoptic report) - Fallopian tubes not identified   D: Left parametrium, biopsy - Fibroadipose tissue with residual mixed malignant germ cell tumor with treatment effect   E: Uterus and cervix, hysterectomy - Right adnexal soft tissue with residual mixed malignant germ cell tumor with treatment effect   Myometrium: - Adenomyosis - Leiomyomata measuring up to 0.7 cm - Extensive chronic serositis   Cervix:  - Ectocervix and endocervix with chronic cervicitis    Endometrium: - Inactive endometrium   F: Low anterior resection - Residual mixed malignant  germ cell  tumor with treatment effect involving colonic serosa and pericolonic adipose tissue - Proximal and distal colonic resection margins with chronic serositis, but negative for tumor    G: Right peritoneum, biopsy - Fibroadipose tissue with residual mixed malignant germ cell tumor with treatment effect   H: Right retroperitoneal mass, excision - Fibroadipose tissue with residual mixed malignant germ cell tumor with treatment effect   I: Omentum, omentectomy - Fibroadipose tissue with residual mixed malignant germ cell tumor with treatment effect   J: Gallbladder, cholecystectomy - Gallbladder with mild chronic serositis and fibrous adhesion   K: Right diaphragm, excision - Fibrous tissue with residual mixed malignant germ cell tumor with treatment effect - Hepatic tissue with no tumor present   L: Left diaphragm nodule, biopsy - Fibrous tissue with residual mixed malignant germ cell tumor with treatment effect   M: Spleen, splenectomy - Residual mixed malignant germ cell tumor with treatment effect involving splenic capsule   N: Appendix, appendectomy - Appendix with fibrous obliteration at the tip - Periappendiceal adipose tissue with acute and chronic inflammation   O: Small bowel mesenteric nodules, excision - Fibroadipose tissue with residual mixed malignant germ cell tumor with treatment effect   P: Mesenteric nodules, excision - Fibroadipose tissue with residual mixed malignant germ cell tumor with treatment effect   Q: Hepatic flexure mass, excision - Fibroadipose tissue with residual mixed malignant germ cell tumor with treatment effect   R: Anvil and donut, biopsy - Benign segment of colon with acute and chronic inflammation and fibrous adhesion  SPECIMEN    Procedure:    Radical hysterectomy     Procedure:    Omentectomy     Procedure:    Peritoneal tumor debulking     Hysterectomy Type:    Abdominal     Specimen Integrity:          Right Ovary Integrity:     Capsule intact     Specimen Integrity:          Left Ovary Integrity:    Capsule intact  TUMOR    Tumor Site:    Bilateral ovaries     Tumor Size:    Greatest Dimension (Centimeters): 19.5 cm     Histologic Type:    Mixed malignant germ cell tumor:  yolk sac tumor, mature teratoma, mature neural elements, immature neural tissue, and immature cartilage     Ovarian Surface Involvement:    Present, right and left     Other Tissue / Organ Involvement:    Right ovary     Other Tissue / Organ Involvement:    Left ovary     Other Tissue / Organ Involvement:    Right fallopian tube     Other Tissue / Organ Involvement:    Omentum     Other Tissue / Organ Involvement:    Diaphragm and splenic capsule     Largest Extrapelvic Peritoneal Focus:    Macroscopic (greater than 2 cm): Splenic capsule     Peritoneal / Ascitic Fluid Involvement:    Not submitted / unknown   REGIONAL LYMPH NODES     Regional Lymph Node Status:    Not applicable (no regional lymph nodes submitted or found)   DISTANT METASTASIS     Distant Site(s) Involved:    Diaphragm, splenic capsule   pTNM CLASSIFICATION (AJCC 8th Edition)     Reporting of pT, pN, and (when applicable) pM categories is based on information available to the pathologist at the time the report is issued. As per the AJCC (Chapter 1, 8th Ed.) it is the managing physician's responsibility to establish the final pathologic stage based upon all pertinent information, including but potentially not limited to this pathology report.     Modified Classification:    y     pT Category:    pT3c     pN Category:    pN not assigned (no nodes submitted or found)     pM Category:    pM1   FIGO STAGE     FIGO Stage:    IIIC   COMMENT:  The needle core biopsies show a heterogeneous combination of elements including abundant neural tissue with atypical and immature features. There are also atypical glandular epithelial structures and focal yolk sac elements with  microcystic pattern.  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  Immunohistochemistry is performed for cytokeratin AE1/AE3, CD117, CD30, G FAP, glypican-3, OCT3/4, synaptophysin, S100 and CD30.     03/14/2022 - 05/02/2022 Chemotherapy   Patient is on Treatment Plan : Malignant germ cell tumor of ovary D1-5 q21d     03/22/2022 Tumor Marker   Patient's tumor was tested for the following markers: AFP. Results of the tumor marker test revealed 7.9.   04/23/2022 Genetic Testing   Negative genetic testing on the CancerNext-Expanded+RNAinsight panel.  The report date is April 23, 2022.  The CancerNext-Expanded gene panel offered by [facility] and includes sequencing and rearrangement analysis for the following 77 genes: AIP, ALK, APC*, ATM*, AXIN2, BAP1, BARD1, BMPR1A, BRCA1*, BRCA2*, BRIP1*, CDC73, CDH1*, CDK4, CDKN1B, CDKN2A, CHEK2*, CTNNA1, DICER1, FH, FLCN, KIF1B, LZTR1, MAX, MEN1, MET, MLH1*, MSH2*, MSH3, MSH6*, MUTYH*, NF1*, NF2, NTHL1, PALB2*, PHOX2B, PMS2*, POT1, PRKAR1A, PTCH1, PTEN*, RAD51C*, RAD51D*, RB1, RET, SDHA, SDHAF2, SDHB, SDHC, SDHD, SMAD4, SMARCA4, SMARCB1, SMARCE1, STK11, SUFU, TMEM127, TP53*, TSC1, TSC2, and VHL (sequencing and deletion/duplication); EGFR, EGLN1, HOXB13, KIT, MITF, PDGFRA, POLD1, and POLE (sequencing only); EPCAM and GREM1 (deletion/duplication only). DNA and RNA analyses performed  for * genes.    05/17/2022 Imaging   CT ABDOMEN PELVIS W WO CONTRAST  Result Date: 05/17/2022 CLINICAL DATA:  History of ovarian cancer status post recent section and completion of chemotherapy 1-1/2 weeks ago. Evaluation of possible ileostomy reversal. EXAM: CT ABDOMEN AND PELVIS WITHOUT AND WITH CONTRAST TECHNIQUE: Multidetector CT imaging of the abdomen and pelvis was performed following the standard protocol before and following the bolus administration of intravenous contrast. RADIATION DOSE REDUCTION: This  exam was performed according to the departmental dose-optimization program which includes automated exposure control, adjustment of the mA and/or kV according to patient size and/or use of iterative reconstruction technique. CONTRAST:  OMNIPAQUE IOHEXOL 300 MG/ML  SOLN COMPARISON:  CT abdomen and pelvis dated 02/09/2022 FINDINGS: Lower chest: No focal consolidation or pulmonary nodule in the lung bases. No pleural effusion or pneumothorax demonstrated. Partially imaged heart size is normal. Hepatobiliary: Postsurgical changes of the right hepatic dome with irregular capsular retraction. Punctate adjacent focus of subdiaphragmatic calcification (11:32). Unchanged subcentimeter hypoattenuating focus along posterior segment 7 (4:18). No intra or extrahepatic biliary ductal dilation. Cholecystectomy. Pancreas: No focal lesions or main ductal dilation. Spleen: Splenectomy. Lobulated, enhancing soft tissue density along the left hemidiaphragm at the splenectomy site measures 1.5 x 1.0 cm (4:11). Adrenals/Urinary Tract: No adrenal nodules. No suspicious renal mass or hydronephrosis. Punctate nonobstructing left lower pole stones. No focal bladder wall thickening. Stomach/Bowel: Normal appearance of the stomach. Right upper quadrant ileostomy. Enteric contrast material is present within the medial limb of the ileostomy. Appendectomy. Vascular/Lymphatic: Aortic atherosclerosis. No enlarged abdominal or pelvic lymph nodes. Reproductive: Hysterectomy and bilateral salpingo-oophorectomy. Ill-defined soft tissue near the left vaginal (4:66). No adnexal masses. Other: Status post omentectomy. Ill-defined lateral peritoneal thickening, for example series 4 image 38 on the right and image 36 on the left associated with scattered foci of mesenteric nodularity, for example 7 x 5 mm (4:34). Musculoskeletal: No acute or abnormal lytic or blastic osseous lesions. Postsurgical changes of the anterior abdominal wall. IMPRESSION: 1.  Extensive postsurgical changes of the abdomen and pelvis. Right upper quadrant ileostomy with enteric contrast material present within the medial limb of the ileostomy. No evidence of obstruction. 2. Multifocal ill-defined soft tissue densities as described, indeterminate, may reflect postsurgical change versus residual tumor. 3. Lobulated, enhancing soft tissue density along the left hemidiaphragm at the splenectomy site may represent residual/recurrent splenic tissue versus residual tumor, although the enhancement pattern does not appear consistent with pre-surgical appearance of the malignancy. 4.  Aortic Atherosclerosis (ICD10-I70.0). Electronically Signed   By: Agustin Cree M.D.   On: 05/17/2022 16:43      06/09/2022 Imaging   CT ABDOMEN PELVIS WO CONTRAST  Result Date: 06/09/2022 CLINICAL DATA:  Ovarian cancer, status post chemotherapy. Rectal contrast administered to evaluate for anastomotic patency prior to reversal. EXAM: CT ABDOMEN AND PELVIS WITHOUT CONTRAST TECHNIQUE: Multidetector CT imaging of the abdomen and pelvis was performed following the standard protocol without IV contrast. RADIATION DOSE REDUCTION: This exam was performed according to the departmental dose-optimization program which includes automated exposure control, adjustment of the mA and/or kV according to patient size and/or use of iterative reconstruction technique. COMPARISON:  05/17/2022 FINDINGS: Lower chest: Lung bases are clear. Hepatobiliary: Unenhanced liver is unremarkable. Status post cholecystectomy. No intrahepatic or extrahepatic ductal dilatation. Pancreas: Within normal limits. 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 Adrenals/Urinary Tract: Adrenal glands are within normal limits. Right kidney is within normal limits. 3 mm nonobstructing left lower pole renal  calculus (series 2/image 22). No hydronephrosis. Bladder is within normal limits. Stomach/Bowel: Stomach is within normal limits.  No evidence of bowel obstruction. 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 Status post left hemicolectomy with suture line in the lower pelvis (series 2/image 87). Rectal contrast confirms a patent anastomosis without evidence of leak. Vascular/Lymphatic: No evidence of abdominal aortic aneurysm. Atherosclerotic calcifications of the abdominal aorta and branch vessels. No suspicious abdominopelvic lymphadenopathy. Reproductive: Status post hysterectomy and suspected bilateral salpingo oophorectomy. Other: No abdominopelvic ascites. Mild peritoneal nodularity along the lateral left mid abdomen, with discrete 8 mm nodule (series 2/image 33), more conspicuous than on the prior. Mild nonspecific mesenteric stranding along the jejunal mesentery in central left mid abdomen (series 2/image 30). Mildly prominent soft tissue along the left pelvic cul-de-sac measuring up to 2.0 cm (series 2/image 59), grossly unchanged. Overall appearance suggests very mild peritoneal disease which is overall grossly unchanged. Musculoskeletal: Visualized osseous structures are within normal limits. 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 Rectal contrast confirms a patent anastomosis without evidence of leak. 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 Additional ancillary findings as above. Electronically Signed   By: Charline Bills M.D.   On: 06/09/2022 01:59   CT ABDOMEN PELVIS W WO CONTRAST  Result Date: 05/17/2022 CLINICAL DATA:  History of ovarian cancer status post recent section and completion of chemotherapy 1-1/2 weeks ago. Evaluation of possible ileostomy reversal. EXAM: CT ABDOMEN AND PELVIS WITHOUT AND WITH CONTRAST TECHNIQUE: Multidetector CT imaging of the abdomen and pelvis was performed following the standard protocol before and following the bolus administration of intravenous contrast. RADIATION DOSE REDUCTION: This exam was  performed according to the departmental dose-optimization program which includes automated exposure control, adjustment of the mA and/or kV according to patient size and/or use of iterative reconstruction technique. CONTRAST:  OMNIPAQUE IOHEXOL 300 MG/ML  SOLN COMPARISON:  CT abdomen and pelvis dated 02/09/2022 FINDINGS: Lower chest: No focal consolidation or pulmonary nodule in the lung bases. No pleural effusion or pneumothorax demonstrated. Partially imaged heart size is normal. Hepatobiliary: Postsurgical changes of the right hepatic dome with irregular capsular retraction. Punctate adjacent focus of subdiaphragmatic calcification (11:32). Unchanged subcentimeter hypoattenuating focus along posterior segment 7 (4:18). No intra or extrahepatic biliary ductal dilation. Cholecystectomy. Pancreas: No focal lesions or main ductal dilation. Spleen: Splenectomy. Lobulated, enhancing soft tissue density along the left hemidiaphragm at the splenectomy site measures 1.5 x 1.0 cm (4:11). Adrenals/Urinary Tract: No adrenal nodules. No suspicious renal mass or hydronephrosis. Punctate nonobstructing left lower pole stones. No focal bladder wall thickening. Stomach/Bowel: Normal appearance of the stomach. Right upper quadrant ileostomy. Enteric contrast material is present within the medial limb of the ileostomy. Appendectomy. Vascular/Lymphatic: Aortic atherosclerosis. No enlarged abdominal or pelvic lymph nodes. Reproductive: Hysterectomy and bilateral salpingo-oophorectomy. Ill-defined soft tissue near the left vaginal (4:66). No adnexal masses. Other: Status post omentectomy. Ill-defined lateral peritoneal thickening, for example series 4 image 38 on the right and image 36 on the left associated with scattered foci of mesenteric nodularity, for example 7 x 5 mm (4:34). Musculoskeletal: No acute or abnormal lytic or blastic osseous lesions. Postsurgical changes of the anterior abdominal wall. IMPRESSION: 1. Extensive  postsurgical changes of the abdomen and pelvis. Right upper quadrant ileostomy with enteric contrast material present within the medial limb of the ileostomy. No evidence of obstruction. 2. Multifocal ill-defined soft tissue densities as described, indeterminate, may reflect postsurgical change versus residual tumor. 3. Lobulated, enhancing  soft tissue density along the left hemidiaphragm at the splenectomy site may represent residual/recurrent splenic tissue versus residual tumor, although the enhancement pattern does not appear consistent with pre-surgical appearance of the malignancy. 4.  Aortic Atherosclerosis (ICD10-I70.0). Electronically Signed   By: Agustin Cree M.D.   On: 05/17/2022 16:43      07/01/2022 Surgery   Pre-operative Diagnosis: Ileostomy, planned reversal   Post-operative Diagnosis: same as above   Operation: Ileostomy takedown   Surgeon: Eugene Garnet MD    Operative Findings: Normal-appearing loop ileostomy with some mild retraction.  Some filmy adhesions around the loop of ileum itself and its mesentery.  Fascia easily identified and normal in appearance.  Ileum freed from surrounding anterior abdominal wall peritoneum.     08/09/2022 Tumor Marker   Patient's tumor was tested for the following markers: CA-125. Results of the tumor marker test revealed 12.6.   08/10/2022 Tumor Marker   Patient's tumor was tested for the following markers: AFP. Results of the tumor marker test revealed 3.   10/06/2022 Imaging   CT A/P: No acute abdominal or pelvic pathology.    01/19/2023 Imaging   CT A/P: 1. New subtle 6 mm hypodensity in the left lobe of the liver, nonspecific consider attention on short-term interval follow-up CT or potentially more definitive characterization by pre and postcontrast enhanced abdominal MRI. 2. No other evidence to suggest metastatic disease in the abdomen or pelvis. 3. Punctate nonobstructive left lower pole renal stones. 4. Moderate volume of  formed stool in the colon. 5.  Aortic Atherosclerosis (ICD10-I70.0).   01/20/2023 Tumor Marker   Patient's tumor was tested for the following markers: CA-125, AFP and HCG. Results of the tumor marker test revealed CA-125 11.5, AFP 4.1, HCG <1.   01/23/2023 Imaging   MRI liver: 1. No solid liver abnormality is seen. Specifically, no mass or suspicious abnormality in the central left lobe of the liver to correspond to finding of prior CT. 2. Status post splenectomy. Unchanged postoperative soft tissue in the left upper quadrant. 3. Hepatic parenchymal iron deposition. 4. Large burden of stool throughout the colon.     Interval History: Doing well.  Denies any abdominal or pelvic pain.  Denies any vaginal bleeding.  Reports significant improvement in her menopausal symptoms including hot flashes and joint pain.  Now only has an occasional mild hot flash.  Has noticed some continued weight gain.  Endorses good appetite.  Has intermittent constipation, responds well to MiraLAX.  Past Medical/Surgical History: Past Medical History:  Diagnosis Date   Arthritis    Endometriosis    Family history of breast cancer    Family history of breast cancer in mother    at age 47   GAD (generalized anxiety disorder)    lexapro helped 2017 (Dr. Emmit Alexanders did have extra stress of the Master's degree program on her at that time.  Got counseling.       GERD (gastroesophageal reflux disease)    Gestational diabetes    History of kidney stones    Low back pain    Dr. Terrilee Files.   Neck pain    and left shoulder pain: managed by osteopathic manipulation by Dr. Terrilee Files. ?cervical radiculopathy   ovarian ca 12/01/2021   PAC (premature atrial contraction)    PONV (postoperative nausea and vomiting)     Past Surgical History:  Procedure Laterality Date   ABDOMINAL EXPLORATION SURGERY  2009   fulguration of endometriosis - [facility] surgery   DEBULKING  02/15/2022   revoved uterus and ovaries, omentun,  gallbladder appendix, spleen  and 6 inches of colon   ILEOSTOMY  02/15/2022   ILEOSTOMY CLOSURE N/A 06/22/2022   Procedure: ILEOSTOMY TAKEDOWN;  Surgeon: Carver Fila, MD;  Location: [facility];  Service: General;  Laterality: N/A;   IR IMAGING GUIDED PORT INSERTION  12/08/2021   IR US GUIDE BX ASP/DRAIN  12/08/2021   SALPINGECTOMY Right 2009   TONSILLECTOMY     TUBAL LIGATION Left 2009    Family History  Problem Relation Age of Onset   Rheum arthritis Mother    Stroke Mother    Hypertension Mother    Heart disease Mother    Arthritis Mother    Breast cancer Mother 52   Heart attack Mother 25   Diabetes Father    Heart disease Father    Hyperlipidemia Father    Hypertension Father    Heart attack Father 17   Non-Hodgkin's lymphoma Paternal Uncle 86   Heart disease Maternal Grandmother    Heart attack Maternal Grandmother 36   Arthritis Maternal Grandmother    Early death Maternal Grandfather 30       MVA   Diabetes Paternal Grandmother    Hypertension Paternal Grandmother    Heart disease Paternal Grandmother    Heart attack Paternal Grandmother 39   Heart disease Paternal Grandfather    Heart attack Paternal Grandfather 24   Coronary artery disease Other    Glaucoma Other     Social History   Socioeconomic History   Marital status: Married    Spouse name: Not on file   Number of children: 3   Years of education: Not on file   Highest education level: Not on file  Occupational History   Occupation: Academic librarian: [place]    Comment: at [employer]   Tobacco Use   Smoking status: Never   Smokeless tobacco: Never  Vaping Use   Vaping status: Never Used  Substance and Sexual Activity   Alcohol use: Not Currently    Comment: Occasionally   Drug use: No   Sexual activity: Yes    Partners: Male    Birth control/protection: Surgical  Other Topics Concern   Not on file  Social History Narrative   Married, 3 kids.    Lives in [place].    Educ: Masters deg nursing.   Pt works as a Systems developer at [employer] with husband Minerva Areola and three kids (Will, [place], and [place]).   No tob/drugs.   Alc: social.   Social Drivers of Health   Financial Resource Strain: Low Risk  (02/17/2022)   Received [facility], [facility]   Overall Financial Resource Strain (CARDIA)    Difficulty of Paying Living Expenses: Not very hard  Food Insecurity: No Food Insecurity (06/22/2022)   Hunger Vital Sign    Worried About Running Out of Food in the Last Year: Never true    Ran Out of Food in the Last Year: Never true  Transportation Needs: No Transportation Needs (06/22/2022)   PRAPARE - Administrator, Civil Service (Medical): No    Lack of Transportation (Non-Medical): No  Physical Activity: Insufficiently Active (11/18/2021)   Received from [facility], [facility]   Exercise Vital Sign    Days of Exercise per Week: 2 days    Minutes of Exercise per Session: 40 min  Stress: No Stress Concern Present (11/18/2021)  Received from [employer], [facility]   [employer] of Occupational Health - Occupational Stress Questionnaire    Feeling of Stress : Only a little  Social Connections: Unknown (11/22/2022)   [facility] from [facility]   Social Network    Social Network: Not on file    Current Medications:  Current Outpatient Medications:    acetaminophen (TYLENOL) 650 MG CR tablet, Take 650 mg by mouth every 8 (eight) hours as needed for pain., Disp: , Rfl:    ascorbic acid (VITAMIN C) 500 MG tablet, Take 500 mg by mouth daily., Disp: , Rfl:    Cholecalciferol (VITAMIN D) 50 MCG (2000 UT) tablet, Take 2,000 Units by mouth daily., Disp: , Rfl:    cyanocobalamin (VITAMIN B12) 1000 MCG tablet, Take 1,000 mcg by mouth daily., Disp: , Rfl:    diphenhydrAMINE (BENADRYL) 25 MG tablet, Take 25 mg by mouth every 6 (six) hours as needed for allergies., Disp: , Rfl:    docusate sodium (COLACE) 50 MG capsule,  Take 50 mg by mouth 2 (two) times daily., Disp: , Rfl:    estradiol (VIVELLE-DOT) 0.05 MG/24HR patch, Place 1 patch (0.05 mg total) onto the skin 2 (two) times a week., Disp: 8 patch, Rfl: 12   famotidine (PEPCID) 10 MG tablet, Take 10 mg by mouth daily as needed for heartburn or indigestion., Disp: , Rfl:    loratadine (CLARITIN) 10 MG tablet, Take by mouth., Disp: , Rfl:    LORazepam (ATIVAN) 0.5 MG tablet, Take 1 tablet (0.5 mg total) by mouth every 8 (eight) hours as needed for anxiety. Do not take and drive, Use with caution. Do not take with pain meds, Disp: 30 tablet, Rfl: 0   pyridoxine (B-6) 500 MG tablet, Take 500 mg by mouth daily., Disp: , Rfl:   Review of Systems: + weight gain, constipation Denies appetite changes, fevers, chills, fatigue. Denies hearing loss, neck lumps or masses, mouth sores, ringing in ears or voice changes. Denies cough or wheezing.  Denies shortness of breath. Denies chest pain or palpitations. Denies leg swelling. Denies abdominal distention, pain, blood in stools, diarrhea, nausea, vomiting, or early satiety. Denies pain with intercourse, dysuria, frequency, hematuria or incontinence. Denies hot flashes, pelvic pain, vaginal bleeding or vaginal discharge.   Denies joint pain, back pain or muscle pain/cramps. Denies itching, rash, or wounds. Denies dizziness, headaches, numbness or seizures. Denies swollen lymph nodes or glands, denies easy bruising or bleeding. Denies anxiety, depression, confusion, or decreased concentration.  Physical Exam: BP (!) 143/61 (BP Location: Right Arm, Patient Position: Sitting, Cuff Size: Normal)   Pulse 85   Temp 97.9 F (36.6 C) (Temporal)   Resp 18   Ht 4\' 11"  (1.499 m)   Wt 127 lb 12.8 oz (58 kg)   LMP  (LMP Unknown) Comment: tubal ligation  SpO2 100%   BMI 25.81 kg/m  General: Alert, oriented, no acute distress. HEENT: Normocephalic, atraumatic, sclera anicteric. Chest: Clear to auscultation bilaterally.  No  wheezes or rhonchi. Cardiovascular: Regular rate and rhythm, no murmurs. Abdomen: soft, nontender.  Normoactive bowel sounds.  No masses or hepatosplenomegaly appreciated.  Well-healed incisions. Extremities: Grossly normal range of motion.  Warm, well perfused.  No edema bilaterally. Skin: No rashes or lesions noted. Lymphatics: No cervical, supraclavicular, or inguinal adenopathy. GU: Normal appearing external genitalia without erythema, excoriation, or lesions.  Speculum exam reveals mildly atrophic vaginal mucosa, no lesions noted.  Bimanual exam reveals smooth, no masses or nodularity.    Laboratory &  Radiologic Studies: None new  Assessment & Plan: TAYJAH ORBACH is a 52 y.o. 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 She has completed 3 cycles of adjuvant chemotherapy with etoposide and cisplatin. She is s/p ileostomy reversal on 06/22/2022.   The patient is overall doing very well and is NED on exam today.   Recent CT with questionable new liver lesion, follow-up MRI without masses or abnormalities.   Recent labs (CA-125, AFP and hcg) all normal.   Menopausal symptoms almost resolved with addition of estrogen replacement therapy.   Per NCCN surveillance recommendations, we will continue with clinic visits every 2-4 months to include an exam and tumor markers with CT imaging every 3-4 months.  Reviewed signs and symptoms that should prompt a phone call before her next scheduled visit.  20 minutes of total time was spent for this patient encounter, including preparation, face-to-face counseling with the patient and coordination of care, and documentation of the encounter.  Eugene Garnet, MD  Division of Gynecologic Oncology  Department of Obstetrics and Gynecology  [facility]

## 2023-02-10 NOTE — Patient Instructions (Signed)
It was good to see you today.  I do not see or feel any evidence of cancer recurrence on your exam.  I will see you for follow-up in 4 months.  As always, if you develop any new and concerning symptoms before your next visit, please call to see me sooner.  

## 2023-02-17 ENCOUNTER — Encounter: Payer: Self-pay | Admitting: Hematology and Oncology

## 2023-02-22 NOTE — Progress Notes (Signed)
 [name] [address] [place] [address] Phone: [phone] Subjective:   LILLETTE Ashley Collet, am serving as a scribe for Dr. Arthea Claudene.  I'm seeing this patient by the request  of:  Patient, No Pcp Per  CC: Back and neck pain  [name]  12/28/2022  Pain seems to be on the lateral aspect of the hip.  Consistent with a greater trochanteric bursitis that is likely exacerbated with patient doing more walking especially with hills.  We discussed augmenting some of the exercises, home exercises and topical anti-inflammatories and icing regimen.  Past medical history is significant for cancer but is going to have a CT scan in the near future.  We will hold on the x-ray secondary to this.  Will reevaluate at when she has a CT scan.  Follow-up with me again in 6 to 8 weeks     Update 02/24/2023 RUMOR SUN is a 53 y.o. female coming in with complaint of back and neck pain. OMT 12/28/2022. Patient states left hip is doing a lot better just .  Patient has of her spine has some discomfort.  Still being monitored for her cancer.  Most recent imaging was fairly unremarkable.  Denies any fevers, chills, any abnormal weight loss.  Medications patient has been prescribed:   Taking:         Reviewed prior external information including notes and imaging from previsou exam, outside providers and external EMR if available.   As well as notes that were available from care everywhere and other healthcare systems.  Past medical history, social, surgical and family history all reviewed in electronic medical record.  No pertanent information unless stated regarding to the chief complaint.   Past Medical History:  Diagnosis Date   Arthritis    Endometriosis    Family history of breast cancer    Family history of breast cancer in mother    at age 37   GAD (generalized anxiety disorder)    lexapro helped 2017 (Dr. Adel did have extra stress  of the Master's degree program on her at that time.  Got counseling.       GERD (gastroesophageal reflux disease)    Gestational diabetes    History of kidney stones    Low back pain    Dr. Zach Maan Zarcone.   Neck pain    and left shoulder pain: managed by osteopathic manipulation by Dr. [name]. ?cervical radiculopathy   ovarian ca 12/01/2021   PAC (premature atrial contraction)    PONV (postoperative nausea and vomiting)     Allergies  Allergen Reactions   Latex Other (See Comments)    Sensitivity only   Sulfa Antibiotics Hives and Other (See Comments)   Sulfamethoxazole-Trimethoprim Rash   Septra [Bactrim] Rash     Review of Systems:  No headache, visual changes, nausea, vomiting, diarrhea, constipation, dizziness, abdominal pain, skin rash, fevers, chills, night sweats, weight loss, swollen lymph nodes, joint swelling, chest pain, shortness of breath, mood changes. POSITIVE muscle aches, body aches  Objective  Blood pressure 112/70, pulse 75, height 4' 11 (1.499 m), weight 127 lb (57.6 kg), SpO2 99%.   General: No apparent distress alert and oriented x3 mood and affect normal, dressed appropriately.  HEENT: Pupils equal, extraocular movements intact  Respiratory: Patient's speak in full sentences and does not appear short of breath  Cardiovascular: No lower extremity edema, non tender, no erythema  Gait MSK:  Back does have some loss lordosis  noted.  Some tenderness to palpation in the paraspinal musculature.  The patient does have significant discomfort over the right sacroiliac joint.  Neck exam also has some limited sidebending bilaterally.  Osteopathic findings  C2 flexed rotated and side bent right C6 flexed rotated and side bent left T3 extended rotated and side bent right inhaled rib T9 extended rotated and side bent left L2 flexed rotated and side bent right Sacrum right on right       Assessment and Plan:  Low back pain Chronic problem, but exacerbation  with patient trying to increase her activity.  Do believe though that patient would do extremely well in the long run.  We discussed about continuing with some core strengthening.  Patient's next imaging for monitoring her cancer and making sure that it continues to be in remission and is in February the patient does state that he may need that up at the moment.  Follow-up again in 6 to 8 weeks    Nonallopathic problems  Decision today to treat with OMT was based on Physical Exam  After verbal consent patient was treated with HVLA, ME, FPR techniques in cervical, rib, thoracic, lumbar, and sacral  areas  Patient tolerated the procedure well with improvement in symptoms  Patient given exercises, stretches and lifestyle modifications  See medications in patient instructions if given  Patient will follow up in 4-8 weeks     The above documentation has been reviewed and is accurate and complete Shonte Soderlund M Camauri Craton, DO         Note: This dictation was prepared with Dragon dictation along with smaller phrase technology. Any transcriptional errors that result from this process are unintentional.

## 2023-02-24 ENCOUNTER — Ambulatory Visit (INDEPENDENT_AMBULATORY_CARE_PROVIDER_SITE_OTHER): Payer: Managed Care, Other (non HMO) | Admitting: Family Medicine

## 2023-02-24 ENCOUNTER — Encounter: Payer: Self-pay | Admitting: Hematology and Oncology

## 2023-02-24 VITALS — BP 112/70 | HR 75 | Ht 59.0 in | Wt 127.0 lb

## 2023-02-24 DIAGNOSIS — M9902 Segmental and somatic dysfunction of thoracic region: Secondary | ICD-10-CM | POA: Diagnosis not present

## 2023-02-24 DIAGNOSIS — M9901 Segmental and somatic dysfunction of cervical region: Secondary | ICD-10-CM | POA: Diagnosis not present

## 2023-02-24 DIAGNOSIS — M5441 Lumbago with sciatica, right side: Secondary | ICD-10-CM

## 2023-02-24 DIAGNOSIS — C569 Malignant neoplasm of unspecified ovary: Secondary | ICD-10-CM | POA: Diagnosis not present

## 2023-02-24 DIAGNOSIS — M9904 Segmental and somatic dysfunction of sacral region: Secondary | ICD-10-CM

## 2023-02-24 DIAGNOSIS — M9903 Segmental and somatic dysfunction of lumbar region: Secondary | ICD-10-CM

## 2023-02-24 DIAGNOSIS — M9908 Segmental and somatic dysfunction of rib cage: Secondary | ICD-10-CM | POA: Diagnosis not present

## 2023-02-24 DIAGNOSIS — G8929 Other chronic pain: Secondary | ICD-10-CM

## 2023-02-24 NOTE — Patient Instructions (Signed)
 Good to see you Follow up in 6-8 weeks

## 2023-02-25 ENCOUNTER — Encounter: Payer: Self-pay | Admitting: Family Medicine

## 2023-02-25 NOTE — Assessment & Plan Note (Signed)
 Chronic problem, but exacerbation with patient trying to increase her activity.  Do believe though that patient would do extremely well in the long run.  We discussed about continuing with some core strengthening.  Patient's next imaging for monitoring her cancer and making sure that it continues to be in remission and is in February the patient does state that he may need that up at the moment.  Follow-up again in 6 to 8 weeks

## 2023-03-01 ENCOUNTER — Other Ambulatory Visit (HOSPITAL_BASED_OUTPATIENT_CLINIC_OR_DEPARTMENT_OTHER): Payer: Self-pay

## 2023-03-01 ENCOUNTER — Encounter: Payer: Self-pay | Admitting: Hematology and Oncology

## 2023-03-08 DIAGNOSIS — C569 Malignant neoplasm of unspecified ovary: Principal | ICD-10-CM

## 2023-03-13 ENCOUNTER — Inpatient Hospital Stay: Payer: Managed Care, Other (non HMO)

## 2023-03-13 ENCOUNTER — Inpatient Hospital Stay: Payer: Managed Care, Other (non HMO) | Attending: Gynecologic Oncology

## 2023-03-13 DIAGNOSIS — Z79899 Other long term (current) drug therapy: Secondary | ICD-10-CM | POA: Diagnosis not present

## 2023-03-13 DIAGNOSIS — C569 Malignant neoplasm of unspecified ovary: Secondary | ICD-10-CM | POA: Diagnosis present

## 2023-03-13 LAB — COMPREHENSIVE METABOLIC PANEL
ALT: 23 U/L (ref 0–44)
AST: 19 U/L (ref 15–41)
Albumin: 4.6 g/dL (ref 3.5–5.0)
Alkaline Phosphatase: 60 U/L (ref 38–126)
Anion gap: 6 (ref 5–15)
BUN: 17 mg/dL (ref 6–20)
CO2: 27 mmol/L (ref 22–32)
Calcium: 9.6 mg/dL (ref 8.9–10.3)
Chloride: 105 mmol/L (ref 98–111)
Creatinine, Ser: 0.8 mg/dL (ref 0.44–1.00)
GFR, Estimated: >60 mL/min (ref >60)
Glucose, Bld: 97 mg/dL (ref 70–99)
Potassium: 4 mmol/L (ref 3.5–5.1)
Sodium: 138 mmol/L (ref 135–145)
Total Bilirubin: 0.5 mg/dL (ref 0.0–1.2)
Total Protein: 7.2 g/dL (ref 6.5–8.1)

## 2023-03-13 LAB — CBC WITH DIFFERENTIAL/PLATELET
Abs Immature Granulocytes: 0.05 10*3/uL (ref 0.00–0.07)
Basophils Absolute: 0.1 10*3/uL (ref 0.0–0.1)
Basophils Relative: 1 %
Eosinophils Absolute: 0.1 10*3/uL (ref 0.0–0.5)
Eosinophils Relative: 1 %
HCT: 40.4 % (ref 36.0–46.0)
Hemoglobin: 13.7 g/dL (ref 12.0–15.0)
Immature Granulocytes: 1 %
Lymphocytes Relative: 29 %
Lymphs Abs: 2.5 10*3/uL (ref 0.7–4.0)
MCH: 32.5 pg (ref 26.0–34.0)
MCHC: 33.9 g/dL (ref 30.0–36.0)
MCV: 96 fL (ref 80.0–100.0)
Monocytes Absolute: 1.2 10*3/uL — ABNORMAL HIGH (ref 0.1–1.0)
Monocytes Relative: 14 %
Neutro Abs: 4.7 10*3/uL (ref 1.7–7.7)
Neutrophils Relative %: 54 %
Platelets: 365 10*3/uL (ref 150–400)
RBC: 4.21 MIL/uL (ref 3.87–5.11)
RDW: 12.5 % (ref 11.5–15.5)
WBC: 8.6 10*3/uL (ref 4.0–10.5)
nRBC: 0 % (ref 0.0–0.2)

## 2023-03-13 MED ORDER — HEPARIN SOD (PORK) LOCK FLUSH 100 UNIT/ML IV SOLN
500.0000 [IU] | Freq: Once | INTRAVENOUS | Status: AC
  Administered 2023-03-13: 500 [IU]

## 2023-03-13 MED ORDER — SODIUM CHLORIDE 0.9% FLUSH
10.0000 mL | Freq: Once | INTRAVENOUS | Status: AC
  Administered 2023-03-13: 10 mL

## 2023-03-14 ENCOUNTER — Telehealth: Payer: Self-pay

## 2023-03-14 NOTE — Telephone Encounter (Signed)
Called and moved 2/4 appt to [phone]. She is aware of appt,

## 2023-03-21 ENCOUNTER — Encounter: Payer: Self-pay | Admitting: Hematology and Oncology

## 2023-03-21 ENCOUNTER — Inpatient Hospital Stay: Payer: Managed Care, Other (non HMO) | Attending: Gynecologic Oncology | Admitting: Hematology and Oncology

## 2023-03-21 ENCOUNTER — Other Ambulatory Visit (HOSPITAL_BASED_OUTPATIENT_CLINIC_OR_DEPARTMENT_OTHER): Payer: Self-pay

## 2023-03-21 VITALS — BP 145/79 | HR 86 | Resp 18 | Ht 59.0 in | Wt 129.0 lb

## 2023-03-21 DIAGNOSIS — C569 Malignant neoplasm of unspecified ovary: Secondary | ICD-10-CM | POA: Diagnosis not present

## 2023-03-21 DIAGNOSIS — R635 Abnormal weight gain: Secondary | ICD-10-CM | POA: Insufficient documentation

## 2023-03-21 MED ORDER — LORAZEPAM 0.5 MG PO TABS
0.5000 mg | ORAL_TABLET | Freq: Three times a day (TID) | ORAL | 0 refills | Status: DC | PRN
  Filled 2023-03-21: qty 20, 7d supply, fill #0

## 2023-03-21 NOTE — Progress Notes (Signed)
 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   All questions were answered. The patient knows to call the clinic with any problems, questions or concerns. The total time spent in the appointment was 20 minutes encounter with patients including review of chart and various tests results, discussions about plan of care and coordination of care plan   Linda Bedford, MD 03/21/2023 12:57 PM  INTERVAL HISTORY: Please see below for problem oriented charting. 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 She is doing well She is eating a lot and is concerned about weight gain She denies abdominal pain or changes in bowel habits We discussed recent test results and future follow-up  REVIEW OF SYSTEMS:   Constitutional: Denies fevers, chills or abnormal weight loss Eyes: Denies blurriness of vision Ears, nose, mouth, throat, and face: Denies mucositis or sore throat Respiratory: Denies cough, dyspnea or wheezes Cardiovascular: Denies palpitation, chest discomfort or lower extremity swelling Gastrointestinal:  Denies nausea, heartburn or change in bowel habits Skin: Denies abnormal skin rashes Lymphatics: Denies new lymphadenopathy or easy bruising Neurological:Denies  numbness, tingling or new weaknesses Behavioral/Psych: Mood is stable, no new changes  All other systems were reviewed with the patient and are negative.  I have reviewed the past medical history, past surgical history, social history and family history with the patient and they are unchanged from previous note.  ALLERGIES:  is allergic to latex, sulfa antibiotics, sulfamethoxazole-trimethoprim, and septra [bactrim].  MEDICATIONS:  Current Outpatient Medications  Medication Sig Dispense Refill   Magnesium  Glycinate 100 MG CAPS Take 200 mg by mouth daily.     acetaminophen  (TYLENOL ) 650 MG CR tablet Take 650 mg by mouth every 8 (eight) hours as needed for pain.     ascorbic acid (VITAMIN C) 500 MG tablet Take 500 mg by mouth daily.     Cholecalciferol (VITAMIN D ) 50 MCG (2000 UT) tablet Take 2,000 Units by mouth daily.     cyanocobalamin  (VITAMIN B12) 1000 MCG tablet Take 1,000 mcg by mouth daily.     diphenhydrAMINE  (BENADRYL ) 25 MG tablet Take 25 mg by mouth every 6 (six) hours as needed for allergies.     docusate sodium  (COLACE) 50 MG capsule Take 50 mg by mouth 2 (two) times daily.     estradiol  (VIVELLE -DOT) 0.05 MG/24HR patch Place 1 patch (0.05 mg total) onto the skin 2 (two) times a week. 8 patch 12   famotidine  (PEPCID ) 10 MG tablet Take 10 mg by mouth daily as needed for heartburn or indigestion.     loratadine (CLARITIN) 10 MG tablet Take by mouth.     LORazepam  (ATIVAN ) 0.5 MG tablet Take 1 tablet (0.5 mg total) by mouth every 8 (eight) hours as needed for anxiety. Do not take and drive, Use with caution. Do not take with pain  meds 20 tablet 0   pyridoxine (B-6) 500 MG tablet Take 500 mg by mouth daily.     No current facility-administered medications for this visit.    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 21.1 by 10.9 by 18.7 cm abdominopelvic mass, with a satellite mass in the right upper quadrant anteriorly, and moderate ascites with tumor deposits along the pelvic ascites posteriorly. The mass could be arising from right or left ovary, or both. The mass displaces the bowel and IVC, but there is no IVC thrombus or pelvic DVT identified. 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 2. Additional satellite mass above the liver is partially included, along with tumor deposits along the ascites. 3. Low-level edema along the lateral abdominal wall musculature. 4. Flattening of the IVC at the level of the aortic bifurcation due to the large abdominopelvic tumor. No findings of pelvic DVT.     12/07/2021 Imaging   CT abdomen and pelvis 1. Large central abdominal and pelvic mass consistent with known malignancy. This is incompletely visualized by this CT of the abdomen, but was seen on recent pelvic MRI. 2. 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 Right omental implant consistent with metastatic disease. Small amount of abdominal ascites. 3. No evidence for bowel or ureteral obstruction. 4. Aortic Atherosclerosis (ICD10-I70.0). 5. Chest findings dictated separately.   12/07/2021 Imaging   CT chest 1. Negative for pulmonary embolism. And no acute or metastatic process identified in the Chest. 2. Partially visible liver mass and ascites, staging CT Abdomen today is reported separately.     12/07/2021 Procedure   Successful placement of a right internal jugular approach power injectable Port-A-Cath. The catheter is ready for immediate use.   12/07/2021 Procedure   Ultrasound-guided biopsy of right upper quadrant mass.    12/08/2021 Initial Diagnosis   Ovarian cancer (HCC)   12/08/2021 Cancer  Staging   Staging form: Ovary, Fallopian Tube, and Primary Peritoneal Carcinoma, AJCC 8th Edition - Clinical stage from 12/08/2021: FIGO Stage IIIC (cT3c, cN0, cM0) - Signed by Lonn Hicks, MD on 12/10/2021 Stage prefix: Initial diagnosis   12/20/2021 - 12/28/2021 Chemotherapy   Patient is on Treatment Plan : Ovarian germ cell tumor q21d x 3 Cycles     01/03/2022 Imaging   IMPRESSION: 1. Large central abdominopelvic mass compatible with malignancy, 3450 cubic cm in volume. This may be arising from the ovaries with slight eccentricity to the right in the pelvis. 2. Mild increase in size of the right omental tumor deposit. 3. Mild increase in size of the subcapsular mass along the dome of the right hepatic lobe, with suspected hepatic invasion. 4. Scattered malignant ascites. 5. No dilated bowel to suggest obstruction. 6. 2 mm left kidney lower pole nonobstructive renal calculus. 7. Mild abdominal aortic atherosclerotic vascular disease.   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  Postoperative Diagnosis: Status post R0 cytoreductive surgery and heating intraperitoneal chemotherapy  Procedures: LAPAROSCOPY, ABDOMEN, PERITONEUM, & OMENTUM, DIAGNOSTIC, W/WO COLLECTION SPECIMEN(S) BY BRUSHING OR WASHING  EXCISION/DESTRUCTION, OPEN, INTRA-ABD TUMOR/CYST/ENDOMETRIOMA, 1+ PERITONEAL/MESENTERI/RETROPERIT 5CM OR <  COLECTOMY, PARTIAL; WITH ANASTOMOSIS COLOSTOMY OR SKIN LEVEL CECOSTOMY SPLENECTOMY; TOT (SEPART PROC) EXPLORATORY LAPAROTOMY, EXPLORATORY CELIOTOMY WITH OR WITHOUT BIOPSY(S) HYPERTHERMIA, EXTERNALLY GENERATED; DEEP CHEMOTHERAPY ADMINISTRATION INTO THE PERITONEAL CAVITY VIA INDWELLING PORT OR CATHETER RESECTION (INITIAL) OVARIAN, TUBAL/PRIM PERITONEAL MALIG W/BIL S&O/OMENTECT; W/RAD DISSECTION FOR DEBULKING CHOLECYSTECTOMY APPENDECTOMY OMENTECTOMY, EPIPLOECTOMY, RESECTION OF OMENTUM Procedures:  Exploratory laparotomy, total abdominal hysterectomy with radical dissection, bilateral ureterolysis, bilateral salpingo-oophorectomy, posterior cul-de-sac peritonectomy, tumor debulking by Dr Viktoria with Gynecology Oncology Dr Wess with surgical oncology: Diagnostic laparoscopy, exploratory laparotomy, low anterior resection with primary end-to-end anastomosis, enterolysis, cholecystectomy, tumor capsule tumor debulking, splenectomy, appendectomy, omentectomy, diverting loop ileostomy formation  Surgeon: Comer Viktoria, MD  Findings: On diagnostic laparoscopy, only able to clearly visualize the left upper quadrant. Adhesions of bowel in the right upper quadrant limiting survey, minimal ascites noted in the pelvis, scattered tumor plaques visualized on left diaphragm. Given these findings, the decision was made to convert to exploratory laparotomy. Multiple loops of small bowel encased in a Cancn like structure in the midline with multiple filmy adhesions tethering to the superior aspect of the a large pelvic mass. Multicystic and necrotic pelvic mass originating from left ovary, approximately 18 x 20 cm incorporated within the left broad ligament with the left tube draped overlying the left ovarian mass. Bilateral tubes normal in appearance, normal-appearing uterus with smooth serosa. Right adnexa similarly with approximate 10 x 8 multicystic and necrotic mass. Diffuse tumor plaques encompassing posterior cul-de-sac peritoneum. Rectosigmoid densely adhered to posterior aspect of left adnexal mass requiring dissection and ultimately resection. Scattered treated subcentimeter nodules along small bowel mesentery. 5 x 5 cm necrotic and multicystic tumor at the level of the hepatic flexure. Enlarged similar multicystic necrotic tumor adherent between the right diaphragm and the liver without any evidence of liver invasion and tumor encased solely within the liver capsule. 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 Omentum overall fairly normal in appearance with only scattered small tumor deposits, no omental cake was encountered. Please see surgical oncology operative note for further details regarding operative findings for their portion of the procedure.  Specimens: ID Type Source Tests Collected by Time Destination 1 : Abdominal fat pad Tissue Abdomen SURGICAL PATHOLOGY EXAM Wess Linda Ghazi, MD 02/15/2022 [phone] 2 : Left adnexa Tissue Abdomen SURGICAL PATHOLOGY EXAM Wess Linda Ghazi, MD 02/15/2022 [phone] : right adnexa Tissue Abdomen SURGICAL PATHOLOGY EXAM Wess Linda Ghazi, MD 02/15/2022 [phone] 4 : Left perimetrium Tissue Uterus SURGICAL PATHOLOGY EXAM Viktoria Comer SAUNDERS, MD 02/15/2022 [phone] : Uterus and cervix Tissue Uterus SURGICAL PATHOLOGY EXAM Wess Linda Ghazi, MD 02/15/2022 1018 6 : Low Anterior Resection Tissue Abdomen SURGICAL PATHOLOGY EXAM Wess Linda Ghazi, MD 02/15/2022 1055 7 : Right peritoneum Tissue Abdomen SURGICAL PATHOLOGY EXAM Wess Linda Ghazi, MD 02/15/2022 1058 8 : Right retroperitoneal mass Tissue Abdomen SURGICAL PATHOLOGY EXAM Wess Linda Ghazi, MD 02/15/2022 1101 9 : Omentum Tissue Omentum SURGICAL PATHOLOGY EXAM Wess Linda Ghazi, MD 02/15/2022 1130 10 : Gallbladder Tissue Gallbladder SURGICAL PATHOLOGY EXAM Wess Linda Ghazi, MD 02/15/2022 1143 11 : Right diaphragm Tissue Diaphragm SURGICAL PATHOLOGY EXAM Wess Linda Ghazi, MD 02/15/2022 1143 12 : Left diaphragm nodule Tissue Diaphragm SURGICAL PATHOLOGY EXAM Wess Linda Ghazi, MD 02/15/2022 1157 13 : Spleen Tissue Spleen SURGICAL PATHOLOGY EXAM Wess Linda Ghazi, MD 02/15/2022 1204 14 : Appendix Tissue Appendix SURGICAL PATHOLOGY EXAM Wess Linda Ghazi, MD 02/15/2022 1213 15 : Small bowel mesenteric nodules Tissue Small Bowel SURGICAL PATHOLOGY EXAM Wess Linda Ghazi, MD 02/15/2022 1215 16 :  Colon mesenteric nodule Tissue  Colon SURGICAL PATHOLOGY EXAM Wess Linda Ghazi, MD 02/15/2022 1221 17 : Hepatic flexure mass Tissue Liver SURGICAL PATHOLOGY EXAM Wess Linda Ghazi, MD 02/15/2022 1227  Complications: None  Indications for Procedure: [name] is a 53 y.o. 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 Prior to the procedure, all risks, benefits, and alternatives were discussed and informed surgical consent was signed.  Procedure: Patient was taken to the operating room where general anesthesia was achieved. She was positioned in dorsal lithotomy and prepped and draped per the surgical oncology team. A foley catheter was inserted into the bladder. A 5 mm skin incision was made in the left upper quadrant at Palmer's point, the abdomen was then entered with direct Optiview entry and the abdomen was insufflated. Findings were noted as above.  The laparoscope was then removed from the abdomen, the pneumoperitoneum was maintained. A vertical midline incision was made with the scalpel and the abdomen was entered sharply. The abdomen and pelvis were surveyed with findings as documented above. A wound protector was placed followed by the [facility] retractor.  The enlarged left adnexal mass was then carefully removed of its bilateral sidewall attachments using a mixture of blunt and cautery dissection. Multiple small bowel loops were adhered to the superior and posterior aspect of the mass with filmy adhesions which were carefully bluntly resected. The rectosigmoid was noted to be densely adhered to the posterior aspect of the pelvic mass. Dissection was attempted using mixture of blunt dissection as well as sharp dissection with the Metzenbaum scissors, and unavoidable enterotomy of approximately 4 cm was made to the anterior surface of the rectosigmoid. This enterotomy was then closed with 4-0 PDS in a running fashion to minimize  contamination. Further dissection removing the remainder of the rectosigmoid from the posterior aspect of the pelvic mass was then undertaken. The pelvic mass was then able to be fully elevated from the pelvis. The left infundibulopelvic ligament was able to be isolated with the left ureter clearly visualized below. The left ureter was further dissected away from the medial leaf of the broad ligament, and a vessel loop was placed. The left infundibulopelvic ligament was then isolated, clamped, and transected using the LigaSure device as well as and suture-ligated. The left utero-ovarian ligament was then isolated, clamped, cauterized and transected. The left pelvic mass was then removed entirely.  Attention was then turned to the right. An additional enlarged multicystic right adnexal mass was noted, this was carefully elevated from the pelvis using blunt dissection. The right round ligament was then grasped, elevated, and transected. The broad ligament was then opened posteriorly. The ureter was identified and the right infundibulopelvic ligament was then isolated, clamped, transected, and tied. The right utero ovarian ligament was then isolated, clamped, and transected using the LigaSure device and the right adnexal mass was removed.  Attention then was turned toward the hysterectomy. A sponge stick was placed within the vagina. The right broad ligament dissection was opened anteriorly and the bladder flap was developed. The left round ligament was then further transected, and the broad ligament opened posteriorly and anteriorly to complete the bladder flap. The right uterine artery was skeletonized, clamped, transected, and suture-ligated. There was significant parametrial tumor involvement on the left side, and therefore a radical dissection was required to isolate the blood supply. The left ureter was further dissected down to the level near the trigone.  The left uterine artery was traced to its origin,  isolated, and clamped with vascular clips. Similarly the left uterine vein due to significant necrotic tumor involvement was friable. Detachment was similarly carefully isolated, and clipped with vascular clips. Sequential clamps were then used to transect the remainder of the broad and cardinal ligaments bilaterally, with each pedicle being suture-ligated. A large right angle clamp was then able to be placed below the cervix, and the uterus and cervix were amputated from the vagina. The vaginal cuff was then closed with several figure-of-eight sutures using 0 Vicryl.    02/15/2022 Pathology Results   A: Abdominal fat pad, excision - Fibroadipose tissue with residual mixed malignant germ cell tumor with treatment effect   B: Left adnexa, salpingo-oophorectomy - Residual mixed malignant germ cell tumor with treatment effect, [id number] (See Comment and Synoptic report) - FIGO stage IIIC - Fallopian tubes with no tumor present   C: Right adnexa, salpingo-oophorectomy - Residual mixed malignant germ cell tumor with treatment effect (See Synoptic report) - Fallopian tubes not identified   D: Left parametrium, biopsy - Fibroadipose tissue with residual mixed malignant germ cell tumor with treatment effect   E: Uterus and cervix, hysterectomy - Right adnexal soft tissue with residual mixed malignant germ cell tumor with treatment effect   Myometrium: - Adenomyosis - Leiomyomata measuring up to 0.7 cm - Extensive chronic serositis   Cervix:  - Ectocervix and endocervix with chronic cervicitis    Endometrium: - Inactive endometrium   F: Low anterior resection - Residual mixed malignant  germ cell tumor with treatment effect involving colonic serosa and pericolonic adipose tissue - Proximal and distal colonic resection margins with chronic serositis, but negative for tumor    G: Right peritoneum, biopsy - Fibroadipose tissue with residual mixed malignant germ cell tumor with treatment  effect   H: Right retroperitoneal mass, excision - Fibroadipose tissue with residual mixed malignant germ cell tumor with treatment effect   I: Omentum, omentectomy - Fibroadipose tissue with residual mixed malignant germ cell tumor with treatment effect   J: Gallbladder, cholecystectomy - Gallbladder with mild chronic serositis and fibrous adhesion   K: Right diaphragm, excision - Fibrous tissue with residual mixed malignant germ cell tumor with treatment effect - Hepatic tissue with no tumor present   L: Left diaphragm nodule, biopsy - Fibrous tissue with residual mixed malignant germ cell tumor with treatment effect   M: Spleen, splenectomy - Residual mixed malignant germ cell tumor with treatment effect involving splenic capsule   N: Appendix, appendectomy - Appendix with fibrous obliteration at the tip - Periappendiceal adipose tissue with acute and chronic inflammation   O: Small bowel mesenteric nodules, excision - Fibroadipose tissue with residual mixed malignant germ cell tumor with treatment effect   P: Mesenteric nodules, excision - Fibroadipose tissue with residual mixed malignant germ cell tumor with treatment effect   Q: Hepatic flexure mass, excision - Fibroadipose tissue with residual mixed malignant germ cell tumor with treatment effect   R: Anvil and donut, biopsy - Benign segment of colon with acute and chronic inflammation and fibrous adhesion  SPECIMEN    Procedure:    Radical hysterectomy     Procedure:    Omentectomy     Procedure:    Peritoneal tumor debulking     Hysterectomy Type:    Abdominal     Specimen Integrity:          Right Ovary Integrity:    Capsule intact  Specimen Integrity:          Left Ovary Integrity:    Capsule intact   TUMOR    Tumor Site:    Bilateral ovaries     Tumor Size:    Greatest Dimension (Centimeters): 19.5 cm     Histologic Type:    Mixed malignant germ cell tumor:  yolk sac tumor, mature teratoma, mature  neural elements, immature neural tissue, and immature cartilage     Ovarian Surface Involvement:    Present, right and left     Other Tissue / Organ Involvement:    Right ovary     Other Tissue / Organ Involvement:    Left ovary     Other Tissue / Organ Involvement:    Right fallopian tube     Other Tissue / Organ Involvement:    Omentum     Other Tissue / Organ Involvement:    Diaphragm and splenic capsule     Largest Extrapelvic Peritoneal Focus:    Macroscopic (greater than 2 cm): Splenic capsule     Peritoneal / Ascitic Fluid Involvement:    Not submitted / unknown   REGIONAL LYMPH NODES     Regional Lymph Node Status:    Not applicable (no regional lymph nodes submitted or found)   DISTANT METASTASIS     Distant Site(s) Involved:    Diaphragm, splenic capsule   pTNM CLASSIFICATION (AJCC 8th Edition)     Reporting of pT, pN, and (when applicable) pM categories is based on information available to the pathologist at the time the report is issued. As per the AJCC (Chapter 1, 8th Ed.) it is the managing physician's responsibility to establish the final pathologic stage based upon all pertinent information, including but potentially not limited to this pathology report.     Modified Classification:    y     pT Category:    pT3c     pN Category:    pN not assigned (no nodes submitted or found)     pM Category:    pM1   FIGO STAGE     FIGO Stage:    IIIC   COMMENT:  The needle core biopsies show a heterogeneous combination of elements including abundant neural tissue with atypical and immature features. There are also atypical glandular epithelial structures and focal yolk sac elements with microcystic pattern.  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  Immunohistochemistry is performed for cytokeratin AE1/AE3, CD117, CD30, G FAP, glypican-3, OCT3/4, synaptophysin, S100 and CD30.     03/14/2022 - 05/02/2022  Chemotherapy   Patient is on Treatment Plan : Malignant germ cell tumor of ovary D1-5 q21d     03/22/2022 Tumor Marker   Patient's tumor was tested for the following markers: AFP. Results of the tumor marker test revealed 7.9.   04/23/2022 Genetic Testing   Negative genetic testing on the CancerNext-Expanded+RNAinsight panel.  The report date is April 23, 2022.  The CancerNext-Expanded gene panel offered by [facility] and includes sequencing and rearrangement analysis for the following 77 genes: AIP, ALK, APC*, ATM*, AXIN2, BAP1, BARD1, BMPR1A, BRCA1*, BRCA2*, BRIP1*, CDC73, CDH1*, CDK4, CDKN1B, CDKN2A, CHEK2*, CTNNA1, DICER1, FH, FLCN, KIF1B, LZTR1, MAX, MEN1, MET, MLH1*, MSH2*, MSH3, MSH6*, MUTYH*, NF1*, NF2, NTHL1, PALB2*, PHOX2B, PMS2*, POT1, PRKAR1A, PTCH1, PTEN*, RAD51C*, RAD51D*, RB1, RET, SDHA, SDHAF2, SDHB, SDHC, SDHD, SMAD4, SMARCA4, SMARCB1, SMARCE1, STK11, SUFU, TMEM127, TP53*, TSC1, TSC2, and VHL (sequencing and  deletion/duplication); EGFR, EGLN1, HOXB13, KIT, MITF, PDGFRA, POLD1, and POLE (sequencing only); EPCAM and GREM1 (deletion/duplication only). DNA and RNA analyses performed for * genes.    05/17/2022 Imaging   CT ABDOMEN PELVIS W WO CONTRAST  Result Date: 05/17/2022 CLINICAL DATA:  History of ovarian cancer status post recent section and completion of chemotherapy 1-1/2 weeks ago. Evaluation of possible ileostomy reversal. EXAM: CT ABDOMEN AND PELVIS WITHOUT AND WITH CONTRAST TECHNIQUE: Multidetector CT imaging of the abdomen and pelvis was performed following the standard protocol before and following the bolus administration of intravenous contrast. RADIATION DOSE REDUCTION: This exam was performed according to the departmental dose-optimization program which includes automated exposure control, adjustment of the mA and/or kV according to patient size and/or use of iterative reconstruction technique. CONTRAST:  OMNIPAQUE  IOHEXOL  300 MG/ML  SOLN COMPARISON:  CT abdomen and  pelvis dated 02/09/2022 FINDINGS: Lower chest: No focal consolidation or pulmonary nodule in the lung bases. No pleural effusion or pneumothorax demonstrated. Partially imaged heart size is normal. Hepatobiliary: Postsurgical changes of the right hepatic dome with irregular capsular retraction. Punctate adjacent focus of subdiaphragmatic calcification (11:32). Unchanged subcentimeter hypoattenuating focus along posterior segment 7 (4:18). No intra or extrahepatic biliary ductal dilation. Cholecystectomy. Pancreas: No focal lesions or main ductal dilation. Spleen: Splenectomy. Lobulated, enhancing soft tissue density along the left hemidiaphragm at the splenectomy site measures 1.5 x 1.0 cm (4:11). Adrenals/Urinary Tract: No adrenal nodules. No suspicious renal mass or hydronephrosis. Punctate nonobstructing left lower pole stones. No focal bladder wall thickening. Stomach/Bowel: Normal appearance of the stomach. Right upper quadrant ileostomy. Enteric contrast material is present within the medial limb of the ileostomy. Appendectomy. Vascular/Lymphatic: Aortic atherosclerosis. No enlarged abdominal or pelvic lymph nodes. Reproductive: Hysterectomy and bilateral salpingo-oophorectomy. Ill-defined soft tissue near the left vaginal (4:66). No adnexal masses. Other: Status post omentectomy. Ill-defined lateral peritoneal thickening, for example series 4 image 38 on the right and image 36 on the left associated with scattered foci of mesenteric nodularity, for example 7 x 5 mm (4:34). Musculoskeletal: No acute or abnormal lytic or blastic osseous lesions. Postsurgical changes of the anterior abdominal wall. IMPRESSION: 1. Extensive postsurgical changes of the abdomen and pelvis. Right upper quadrant ileostomy with enteric contrast material present within the medial limb of the ileostomy. No evidence of obstruction. 2. Multifocal ill-defined soft tissue densities as described, indeterminate, may reflect postsurgical  change versus residual tumor. 3. Lobulated, enhancing soft tissue density along the left hemidiaphragm at the splenectomy site may represent residual/recurrent splenic tissue versus residual tumor, although the enhancement pattern does not appear consistent with pre-surgical appearance of the malignancy. 4.  Aortic Atherosclerosis (ICD10-I70.0). Electronically Signed   By: Limin  Xu M.D.   On: 05/17/2022 16:43      06/09/2022 Imaging   CT ABDOMEN PELVIS WO CONTRAST  Result Date: 06/09/2022 CLINICAL DATA:  Ovarian cancer, status post chemotherapy. Rectal contrast administered to evaluate for anastomotic patency prior to reversal. EXAM: CT ABDOMEN AND PELVIS WITHOUT CONTRAST TECHNIQUE: Multidetector CT imaging of the abdomen and pelvis was performed following the standard protocol without IV contrast. RADIATION DOSE REDUCTION: This exam was performed according to the departmental dose-optimization program which includes automated exposure control, adjustment of the mA and/or kV according to patient size and/or use of iterative reconstruction technique. COMPARISON:  05/17/2022 FINDINGS: Lower chest: Lung bases are clear. Hepatobiliary: Unenhanced liver is unremarkable. Status post cholecystectomy. No intrahepatic or extrahepatic ductal dilatation. Pancreas: Within normal limits. 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 Adrenals/Urinary Tract: Adrenal glands are within normal limits. Right kidney is within normal limits. 3 mm nonobstructing left lower pole renal calculus (series 2/image 22). No hydronephrosis. Bladder is within normal limits. Stomach/Bowel: Stomach is within normal limits. No evidence of bowel obstruction. 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 Status post left hemicolectomy with suture line in the lower pelvis (series 2/image 87). Rectal contrast confirms a patent anastomosis without evidence of leak. Vascular/Lymphatic: No evidence of abdominal aortic  aneurysm. Atherosclerotic calcifications of the abdominal aorta and branch vessels. No suspicious abdominopelvic lymphadenopathy. Reproductive: Status post hysterectomy and suspected bilateral salpingo oophorectomy. Other: No abdominopelvic ascites. Mild peritoneal nodularity along the lateral left mid abdomen, with discrete 8 mm nodule (series 2/image 33), more conspicuous than on the prior. Mild nonspecific mesenteric stranding along the jejunal mesentery in central left mid abdomen (series 2/image 30). Mildly prominent soft tissue along the left pelvic cul-de-sac measuring up to 2.0 cm (series 2/image 59), grossly unchanged. Overall appearance suggests very mild peritoneal disease which is overall grossly unchanged. Musculoskeletal: Visualized osseous structures are within normal limits. 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 Rectal contrast confirms a patent anastomosis without evidence of leak. 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 Additional ancillary findings as above. Electronically Signed   By: Pinkie Pebbles M.D.   On: 06/09/2022 01:59   CT ABDOMEN PELVIS W WO CONTRAST  Result Date: 05/17/2022 CLINICAL DATA:  History of ovarian cancer status post recent section and completion of chemotherapy 1-1/2 weeks ago. Evaluation of possible ileostomy reversal. EXAM: CT ABDOMEN AND PELVIS WITHOUT AND WITH CONTRAST TECHNIQUE: Multidetector CT imaging of the abdomen and pelvis was performed following the standard protocol before and following the bolus administration of intravenous contrast. RADIATION DOSE REDUCTION: This exam was performed according to the departmental dose-optimization program which includes automated exposure control, adjustment of the mA and/or kV according to patient size and/or use of iterative reconstruction technique. CONTRAST:  OMNIPAQUE  IOHEXOL  300 MG/ML  SOLN COMPARISON:  CT abdomen and  pelvis dated 02/09/2022 FINDINGS: Lower chest: No focal consolidation or pulmonary nodule in the lung bases. No pleural effusion or pneumothorax demonstrated. Partially imaged heart size is normal. Hepatobiliary: Postsurgical changes of the right hepatic dome with irregular capsular retraction. Punctate adjacent focus of subdiaphragmatic calcification (11:32). Unchanged subcentimeter hypoattenuating focus along posterior segment 7 (4:18). No intra or extrahepatic biliary ductal dilation. Cholecystectomy. Pancreas: No focal lesions or main ductal dilation. Spleen: Splenectomy. Lobulated, enhancing soft tissue density along the left hemidiaphragm at the splenectomy site measures 1.5 x 1.0 cm (4:11). Adrenals/Urinary Tract: No adrenal nodules. No suspicious renal mass or hydronephrosis. Punctate nonobstructing left lower pole stones. No focal bladder wall thickening. Stomach/Bowel: Normal appearance of the stomach. Right upper quadrant ileostomy. Enteric contrast material is present within the medial limb of the ileostomy. Appendectomy. Vascular/Lymphatic: Aortic atherosclerosis. No enlarged abdominal or pelvic lymph nodes. Reproductive: Hysterectomy and bilateral salpingo-oophorectomy. Ill-defined soft tissue near the left vaginal (4:66). No adnexal masses. Other: Status post omentectomy. Ill-defined lateral peritoneal thickening, for example series 4 image 38 on the right and image 36 on the left associated with scattered foci of mesenteric nodularity, for example 7 x 5 mm (4:34). Musculoskeletal: No acute or abnormal lytic or blastic osseous lesions. Postsurgical changes of the anterior abdominal wall. IMPRESSION: 1. Extensive postsurgical changes of the abdomen and pelvis. Right upper quadrant ileostomy with enteric contrast material present within the medial limb of the ileostomy. No evidence  of obstruction. 2. Multifocal ill-defined soft tissue densities as described, indeterminate, may reflect postsurgical  change versus residual tumor. 3. Lobulated, enhancing soft tissue density along the left hemidiaphragm at the splenectomy site may represent residual/recurrent splenic tissue versus residual tumor, although the enhancement pattern does not appear consistent with pre-surgical appearance of the malignancy. 4.  Aortic Atherosclerosis (ICD10-I70.0). Electronically Signed   By: Limin  Xu M.D.   On: 05/17/2022 16:43      07/01/2022 Surgery   Pre-operative Diagnosis: Ileostomy, planned reversal   Post-operative Diagnosis: same as above   Operation: Ileostomy takedown   Surgeon: Viktoria Crank MD    Operative Findings: Normal-appearing loop ileostomy with some mild retraction.  Some filmy adhesions around the loop of ileum itself and its mesentery.  Fascia easily identified and normal in appearance.  Ileum freed from surrounding anterior abdominal wall peritoneum.     08/09/2022 Tumor Marker   Patient's tumor was tested for the following markers: CA-125. Results of the tumor marker test revealed 12.6.   08/10/2022 Tumor Marker   Patient's tumor was tested for the following markers: AFP. Results of the tumor marker test revealed 3.   10/06/2022 Imaging   CT A/P: No acute abdominal or pelvic pathology.    01/19/2023 Imaging   CT A/P: 1. New subtle 6 mm hypodensity in the left lobe of the liver, nonspecific consider attention on short-term interval follow-up CT or potentially more definitive characterization by pre and postcontrast enhanced abdominal MRI. 2. No other evidence to suggest metastatic disease in the abdomen or pelvis. 3. Punctate nonobstructive left lower pole renal stones. 4. Moderate volume of formed stool in the colon. 5.  Aortic Atherosclerosis (ICD10-I70.0).   01/20/2023 Tumor Marker   Patient's tumor was tested for the following markers: CA-125, AFP and HCG. Results of the tumor marker test revealed CA-125 11.5, AFP 4.1, HCG <1.   01/23/2023 Imaging   MRI liver: 1. No  solid liver abnormality is seen. Specifically, no mass or suspicious abnormality in the central left lobe of the liver to correspond to finding of prior CT. 2. Status post splenectomy. Unchanged postoperative soft tissue in the left upper quadrant. 3. Hepatic parenchymal iron deposition. 4. Large burden of stool throughout the colon.     PHYSICAL EXAMINATION: ECOG PERFORMANCE STATUS: 0 - Asymptomatic  Vitals:   03/21/23 1129  BP: (!) 145/79  Pulse: 86  Resp: 18  SpO2: 100%   Filed Weights   03/21/23 1129  Weight: 129 lb (58.5 kg)    GENERAL:alert, no distress and comfortable SKIN: skin color, texture, turgor are normal, no rashes or significant lesions EYES: normal, Conjunctiva are pink and non-injected, sclera clear OROPHARYNX:no exudate, no erythema and lips, buccal mucosa, and tongue normal  NECK: supple, thyroid  normal size, non-tender, without nodularity LYMPH:  no palpable lymphadenopathy in the cervical, axillary or inguinal LUNGS: clear to auscultation and percussion with normal breathing effort HEART: regular rate & rhythm and no murmurs and no lower extremity edema ABDOMEN:abdomen soft, non-tender and normal bowel sounds Musculoskeletal:no cyanosis of digits and no clubbing  NEURO: alert & oriented x 3 with fluent speech, no focal motor/sensory deficits  LABORATORY DATA:  I have reviewed the data as listed    Component Value Date/Time   NA 138 03/13/2023 0944   K 4.0 03/13/2023 0944   CL 105 03/13/2023 0944   CO2 27 03/13/2023 0944   GLUCOSE 97 03/13/2023 0944   BUN 17 03/13/2023 0944   CREATININE 0.80 03/13/2023 0944  CREATININE 0.57 04/18/2022 1202   CREATININE 0.75 03/23/2011 1550   CALCIUM 9.6 03/13/2023 0944   PROT 7.2 03/13/2023 0944   ALBUMIN 4.6 03/13/2023 0944   AST 19 03/13/2023 0944   AST 38 04/18/2022 1202   ALT 23 03/13/2023 0944   ALT 28 04/18/2022 1202   ALKPHOS 60 03/13/2023 0944   BILITOT 0.5 03/13/2023 0944   BILITOT 0.2 (L)  04/18/2022 1202   GFRNONAA >60 03/13/2023 0944   GFRNONAA >60 04/18/2022 1202    No results found for: SPEP, UPEP  Lab Results  Component Value Date   WBC 8.6 03/13/2023   NEUTROABS 4.7 03/13/2023   HGB 13.7 03/13/2023   HCT 40.4 03/13/2023   MCV 96.0 03/13/2023   PLT 365 03/13/2023      Chemistry      Component Value Date/Time   NA 138 03/13/2023 0944   K 4.0 03/13/2023 0944   CL 105 03/13/2023 0944   CO2 27 03/13/2023 0944   BUN 17 03/13/2023 0944   CREATININE 0.80 03/13/2023 0944   CREATININE 0.57 04/18/2022 1202   CREATININE 0.75 03/23/2011 1550      Component Value Date/Time   CALCIUM 9.6 03/13/2023 0944   ALKPHOS 60 03/13/2023 0944   AST 19 03/13/2023 0944   AST 38 04/18/2022 1202   ALT 23 03/13/2023 0944   ALT 28 04/18/2022 1202   BILITOT 0.5 03/13/2023 0944   BILITOT 0.2 (L) 04/18/2022 1202

## 2023-03-21 NOTE — Assessment & Plan Note (Signed)
Clinically, her examination is benign Tumor markers were negative She is scheduled for repeat CT in March I will see her back in June for further follow-up

## 2023-03-21 NOTE — Assessment & Plan Note (Signed)
She is concerned about weight gain We discussed dietary modification and the importance of strength training exercises

## 2023-03-29 ENCOUNTER — Other Ambulatory Visit (HOSPITAL_BASED_OUTPATIENT_CLINIC_OR_DEPARTMENT_OTHER): Payer: Self-pay

## 2023-04-04 NOTE — Progress Notes (Unsigned)
 [name] [address] [place] [address] Phone: [phone] Subjective:   [name], am serving as a scribe for Dr. Antoine Primas.  I'm seeing this patient by the request  of:  Patient, No Pcp Per  CC: back and neck pain follow up   NAT:[id number]  [name] is a 54 y.o. female coming in with complaint of back and neck pain. OMT 12/28/2022. Patient states doing a little better. No new symptoms.  Patient has been doing very well but is still concerned of course for any reoccurrence of her cancer but has been very well managed at the moment.  Patient is trying to increase certain activities.  Medications patient has been prescribed: None  Taking:         Reviewed prior external information including notes and imaging from previsou exam, outside providers and external EMR if available.   As well as notes that were available from care everywhere and other healthcare systems.  Past medical history, social, surgical and family history all reviewed in electronic medical record.  No pertanent information unless stated regarding to the chief complaint.   Past Medical History:  Diagnosis Date   Arthritis    Endometriosis    Family history of breast cancer    Family history of breast cancer in mother    at age 75   GAD (generalized anxiety disorder)    lexapro helped 2017 (Dr. Emmit Alexanders did have extra stress of the Master's degree program on her at that time.  Got counseling.       GERD (gastroesophageal reflux disease)    Gestational diabetes    History of kidney stones    Low back pain    Dr. Terrilee Files.   Neck pain    and left shoulder pain: managed by osteopathic manipulation by Dr. Terrilee Files. ?cervical radiculopathy   ovarian ca 12/01/2021   PAC (premature atrial contraction)    PONV (postoperative nausea and vomiting)     Allergies  Allergen Reactions   Latex Other (See Comments)    Sensitivity only   Sulfa  Antibiotics Hives and Other (See Comments)   Sulfamethoxazole-Trimethoprim Rash   Septra [Bactrim] Rash     Review of Systems:  No headache, visual changes, nausea, vomiting, diarrhea, constipation, dizziness, abdominal pain, skin rash, fevers, chills, night sweats, weight loss, swollen lymph nodes, body aches, joint swelling, chest pain, shortness of breath, mood changes. POSITIVE muscle aches  Objective  Blood pressure 122/72, pulse 78, height 4\' 11"  (1.499 m), weight 125 lb (56.7 kg), SpO2 98%.   General: No apparent distress alert and oriented x3 mood and affect normal, dressed appropriately.  HEENT: Pupils equal, extraocular movements intact  Respiratory: Patient's speak in full sentences and does not appear short of breath  Cardiovascular: No lower extremity edema, non tender, no erythema  MSK:  Back does have some loss of lordosis noted.  Some tenderness to palpation in the paraspinal musculature.  Discussed icing regimen and home exercises.  Increase activity slowly.  Does have some hypermobility also noted.  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  Discussed icing regimen and home exercises, discussed which activities to do and which ones to avoid.  Increase activity slowly.  Follow-up with me again in 6 to 8 weeks as well.    Nonallopathic problems  Decision today to treat with OMT was based on Physical Exam  After verbal consent patient was treated with HVLA, ME, FPR  techniques in cervical, rib, thoracic, lumbar, and sacral  areas  Patient tolerated the procedure well with improvement in symptoms  Patient given exercises, stretches and lifestyle modifications  See medications in patient instructions if given  Patient will follow up in 4-8 weeks     The above documentation has been reviewed and is accurate and complete Judi Saa, DO         Note: This dictation was prepared with Dragon dictation along with smaller phrase technology. Any transcriptional errors that result from this process are unintentional.

## 2023-04-07 ENCOUNTER — Ambulatory Visit: Payer: Managed Care, Other (non HMO) | Admitting: Family Medicine

## 2023-04-07 ENCOUNTER — Encounter: Payer: Self-pay | Admitting: Family Medicine

## 2023-04-07 VITALS — BP 122/72 | HR 78 | Ht 59.0 in | Wt 125.0 lb

## 2023-04-07 DIAGNOSIS — M9904 Segmental and somatic dysfunction of sacral region: Secondary | ICD-10-CM | POA: Diagnosis not present

## 2023-04-07 DIAGNOSIS — M5412 Radiculopathy, cervical region: Secondary | ICD-10-CM

## 2023-04-07 DIAGNOSIS — M9902 Segmental and somatic dysfunction of thoracic region: Secondary | ICD-10-CM | POA: Diagnosis not present

## 2023-04-07 DIAGNOSIS — M9908 Segmental and somatic dysfunction of rib cage: Secondary | ICD-10-CM

## 2023-04-07 DIAGNOSIS — M9903 Segmental and somatic dysfunction of lumbar region: Secondary | ICD-10-CM | POA: Diagnosis not present

## 2023-04-07 DIAGNOSIS — M9901 Segmental and somatic dysfunction of cervical region: Secondary | ICD-10-CM | POA: Diagnosis not present

## 2023-04-07 NOTE — Patient Instructions (Addendum)
 Good to see you. Dr. Servando Salina would be a great option. Let me know if you need anything. Return in 6 to 8 weeks.

## 2023-04-07 NOTE — Assessment & Plan Note (Signed)
 Tightness noted.  Discussed icing regimen and home exercises, discussed which activities to do and which ones to avoid.  Increase activity slowly.  Follow-up with me again in 6 to 8 weeks as well.

## 2023-04-10 ENCOUNTER — Other Ambulatory Visit (HOSPITAL_COMMUNITY): Payer: 59

## 2023-04-17 ENCOUNTER — Inpatient Hospital Stay: Payer: Managed Care, Other (non HMO) | Attending: Gynecologic Oncology

## 2023-04-17 ENCOUNTER — Ambulatory Visit (HOSPITAL_COMMUNITY)
Admission: RE | Admit: 2023-04-17 | Discharge: 2023-04-17 | Disposition: A | Discharge status: Home / Self Care | Payer: Managed Care, Other (non HMO) | Source: Ambulatory Visit | Attending: Gynecologic Oncology | Admitting: Gynecologic Oncology

## 2023-04-17 DIAGNOSIS — C569 Malignant neoplasm of unspecified ovary: Secondary | ICD-10-CM | POA: Insufficient documentation

## 2023-04-17 LAB — COMPREHENSIVE METABOLIC PANEL
ALT: 25 U/L (ref 0–44)
AST: 18 U/L (ref 15–41)
Albumin: 4.5 g/dL (ref 3.5–5.0)
Alkaline Phosphatase: 63 U/L (ref 38–126)
Anion gap: 8 (ref 5–15)
BUN: 16 mg/dL (ref 6–20)
CO2: 26 mmol/L (ref 22–32)
Calcium: 9.4 mg/dL (ref 8.9–10.3)
Chloride: 103 mmol/L (ref 98–111)
Creatinine, Ser: 0.75 mg/dL (ref 0.44–1.00)
GFR, Estimated: >60 mL/min (ref >60)
Glucose, Bld: 110 mg/dL — ABNORMAL HIGH (ref 70–99)
Potassium: 3.9 mmol/L (ref 3.5–5.1)
Sodium: 137 mmol/L (ref 135–145)
Total Bilirubin: 0.7 mg/dL (ref 0.0–1.2)
Total Protein: 7.3 g/dL (ref 6.5–8.1)

## 2023-04-17 LAB — CBC WITH DIFFERENTIAL/PLATELET
Abs Immature Granulocytes: 0.03 10*3/uL (ref 0.00–0.07)
Basophils Absolute: 0.1 10*3/uL (ref 0.0–0.1)
Basophils Relative: 1 %
Eosinophils Absolute: 0 10*3/uL (ref 0.0–0.5)
Eosinophils Relative: 0 %
HCT: 40.7 % (ref 36.0–46.0)
Hemoglobin: 13.7 g/dL (ref 12.0–15.0)
Immature Granulocytes: 0 %
Lymphocytes Relative: 20 %
Lymphs Abs: 1.8 10*3/uL (ref 0.7–4.0)
MCH: 32.4 pg (ref 26.0–34.0)
MCHC: 33.7 g/dL (ref 30.0–36.0)
MCV: 96.2 fL (ref 80.0–100.0)
Monocytes Absolute: 1 10*3/uL (ref 0.1–1.0)
Monocytes Relative: 11 %
Neutro Abs: 6 10*3/uL (ref 1.7–7.7)
Neutrophils Relative %: 68 %
Platelets: 364 10*3/uL (ref 150–400)
RBC: 4.23 MIL/uL (ref 3.87–5.11)
RDW: 12.7 % (ref 11.5–15.5)
WBC: 8.9 10*3/uL (ref 4.0–10.5)
nRBC: 0 % (ref 0.0–0.2)

## 2023-04-17 MED ORDER — SODIUM CHLORIDE 0.9% FLUSH
10.0000 mL | Freq: Once | INTRAVENOUS | Status: AC
  Administered 2023-04-17: 10 mL

## 2023-04-17 MED ORDER — HEPARIN SOD (PORK) LOCK FLUSH 100 UNIT/ML IV SOLN
500.0000 [IU] | Freq: Once | INTRAVENOUS | Status: AC
  Administered 2023-04-17: 500 [IU] via INTRAVENOUS

## 2023-04-17 MED ORDER — IOHEXOL 300 MG/ML  SOLN
30.0000 mL | Freq: Once | INTRAMUSCULAR | Status: AC | PRN
  Administered 2023-04-17: 30 mL via ORAL

## 2023-04-17 MED ORDER — IOHEXOL 300 MG/ML  SOLN
100.0000 mL | Freq: Once | INTRAMUSCULAR | Status: AC | PRN
  Administered 2023-04-17: 100 mL via INTRAVENOUS

## 2023-04-18 ENCOUNTER — Telehealth: Payer: Self-pay | Admitting: Oncology

## 2023-04-18 LAB — AFP TUMOR MARKER: AFP, Serum, Tumor Marker: 3.7 ng/mL (ref 0.0–9.2)

## 2023-04-18 LAB — BETA HCG QUANT (REF LAB): hCG Quant: 1 m[IU]/mL

## 2023-04-18 LAB — CA 125: Cancer Antigen (CA) 125: 11.2 U/mL (ref 0.0–38.1)

## 2023-04-18 NOTE — Telephone Encounter (Signed)
 Called [facility] and went over her CT results.  Let her know that per Hurley Cisco, NP, overall, the CT scan is stable with no obvious signs of cancer return or spread.   Reviewed that there is a triangular nodule 2 mm that is stable from 2023 in the lungs that they feel is a benign lung lymph node. The radiologist states that you could get a interval follow up CT scan of the chest. Will will see if Dr. Pricilla Holm recommends this and will let her know.  There is also a small cyst in the left part of the liver. Stable too small to characterize area in the right liver. No suspicious lesions seen.   They see an area in the left upper quadrant that looks similar to last scan and they feel this is a remnant of the spleen.   They note constipation. Havilah said this has been an ongoing problem since her ostomy reversal.  She takes Miralax as needed.  There is an area at the left vaginal cuff that is stable over prior exams and they feel this is most likely post-surgery changes.   Deone verbalized understanding of the results and did not have any further questions.

## 2023-04-22 ENCOUNTER — Other Ambulatory Visit (HOSPITAL_BASED_OUTPATIENT_CLINIC_OR_DEPARTMENT_OTHER): Payer: Self-pay

## 2023-05-18 NOTE — Progress Notes (Unsigned)
 [name] [address] [place] [address] Phone: [phone] Subjective:   [name], am serving as a scribe for Dr. Antoine Primas.  I'm seeing this patient by the request  of:  [name], MD  CC: Back and neck pain follow-up  [name]  [name] is a 53 y.o. female coming in with complaint of back and neck pain. OMT 04/07/2023. Patient states continues to have some discomfort and pain.  Patient has been trying to work on a more regular basis.  Little concerned because she continues to gain weight.  Patient describes the pain as a dull, throbbing aching sensations.  Nothing that stopping her from activity.  Medications patient has been prescribed: None  Taking:         Reviewed prior external information including notes and imaging from previsou exam, outside providers and external EMR if available.   As well as notes that were available from care everywhere and other healthcare systems.  Past medical history, social, surgical and family history all reviewed in electronic medical record.  No pertanent information unless stated regarding to the chief complaint.   Past Medical History:  Diagnosis Date   Arthritis    Endometriosis    Family history of breast cancer    Family history of breast cancer in mother    at age 67   GAD (generalized anxiety disorder)    lexapro helped 2017 (Dr. Emmit Alexanders did have extra stress of the Master's degree program on her at that time.  Got counseling.       GERD (gastroesophageal reflux disease)    Gestational diabetes    History of kidney stones    Low back pain    Dr. Terrilee Files.   Neck pain    and left shoulder pain: managed by osteopathic manipulation by Dr. Terrilee Files. ?cervical radiculopathy   ovarian ca 12/01/2021   PAC (premature atrial contraction)    PONV (postoperative nausea and vomiting)     Allergies  Allergen Reactions   Latex Other (See Comments)     Sensitivity only   Sulfa Antibiotics Hives and Other (See Comments)   Sulfamethoxazole-Trimethoprim Rash   Septra [Bactrim] Rash     Review of Systems:  No headache, visual changes, nausea, vomiting, diarrhea, constipation, dizziness, abdominal pain, skin rash, fevers, chills, night sweats, weight loss, swollen lymph nodes, body aches, joint swelling, chest pain, shortness of breath, mood changes. POSITIVE muscle aches  Objective  Blood pressure 104/64, pulse 70, height 4\' 11"  (1.499 m), weight 128 lb (58.1 kg), SpO2 98%.   General: No apparent distress alert and oriented x3 mood and affect normal, dressed appropriately.  HEENT: Pupils equal, extraocular movements intact  Respiratory: Patient's speak in full sentences and does not appear short of breath  Cardiovascular: No lower extremity edema, non tender, no erythema  Gait MSK:  Back low back does have some mild loss lordosis.  Significant tightness noted in the thoracic area.  Tightness in the neck with sidebending bilaterally.  Osteopathic findings  C3 flexed rotated and side bent right C6 flexed rotated and side bent left T3 extended rotated and side bent right inhaled rib T9 extended rotated and side bent left L3 flexed rotated and side bent right Sacrum right on right       Assessment and Plan:  Backache Backache seems to be a little bit more.  Has been doing certain activities.  Continue to do more stretching afterwards.  Continue work  on protein.  Patient continues to have difficulty with weight gain and can consider other treatment options.  Patient would like to keep everything as natural as possible.  Follow-up again in 6 to 8 weeks otherwise.    Nonallopathic problems  Decision today to treat with OMT was based on Physical Exam  After verbal consent patient was treated with HVLA, ME, FPR techniques in cervical, rib, thoracic, lumbar, and sacral  areas  Patient tolerated the procedure well with improvement in  symptoms  Patient given exercises, stretches and lifestyle modifications  See medications in patient instructions if given  Patient will follow up in 4-8 weeks    The above documentation has been reviewed and is accurate and complete Judi Saa, DO          Note: This dictation was prepared with Dragon dictation along with smaller phrase technology. Any transcriptional errors that result from this process are unintentional.

## 2023-05-19 ENCOUNTER — Ambulatory Visit (INDEPENDENT_AMBULATORY_CARE_PROVIDER_SITE_OTHER): Payer: Managed Care, Other (non HMO) | Admitting: Family Medicine

## 2023-05-19 ENCOUNTER — Encounter: Payer: Self-pay | Admitting: Hematology and Oncology

## 2023-05-19 ENCOUNTER — Other Ambulatory Visit (HOSPITAL_BASED_OUTPATIENT_CLINIC_OR_DEPARTMENT_OTHER): Payer: Self-pay

## 2023-05-19 VITALS — BP 104/64 | HR 70 | Ht 59.0 in | Wt 128.0 lb

## 2023-05-19 DIAGNOSIS — M9904 Segmental and somatic dysfunction of sacral region: Secondary | ICD-10-CM

## 2023-05-19 DIAGNOSIS — M9908 Segmental and somatic dysfunction of rib cage: Secondary | ICD-10-CM

## 2023-05-19 DIAGNOSIS — M9903 Segmental and somatic dysfunction of lumbar region: Secondary | ICD-10-CM

## 2023-05-19 DIAGNOSIS — M9902 Segmental and somatic dysfunction of thoracic region: Secondary | ICD-10-CM

## 2023-05-19 DIAGNOSIS — G8929 Other chronic pain: Secondary | ICD-10-CM

## 2023-05-19 DIAGNOSIS — M545 Low back pain, unspecified: Secondary | ICD-10-CM | POA: Diagnosis not present

## 2023-05-19 DIAGNOSIS — M9901 Segmental and somatic dysfunction of cervical region: Secondary | ICD-10-CM

## 2023-05-19 NOTE — Patient Instructions (Signed)
 Good to see you.  DHEA 50 mg daily for 4 weeks and see how you feel  Ashby Dawes made is good See me again in 6-8 weeks

## 2023-05-19 NOTE — Assessment & Plan Note (Signed)
 Backache seems to be a little bit more.  Has been doing certain activities.  Continue to do more stretching afterwards.  Continue work on protein.  Patient continues to have difficulty with weight gain and can consider other treatment options.  Patient would like to keep everything as natural as possible.  Follow-up again in 6 to 8 weeks otherwise.

## 2023-05-25 ENCOUNTER — Encounter: Payer: Self-pay | Admitting: Hematology and Oncology

## 2023-05-29 DIAGNOSIS — F4323 Adjustment disorder with mixed anxiety and depressed mood: Secondary | ICD-10-CM | POA: Diagnosis not present

## 2023-06-07 ENCOUNTER — Encounter: Payer: Self-pay | Admitting: Gynecologic Oncology

## 2023-06-08 DIAGNOSIS — F4323 Adjustment disorder with mixed anxiety and depressed mood: Secondary | ICD-10-CM | POA: Diagnosis not present

## 2023-06-09 ENCOUNTER — Encounter: Payer: Self-pay | Admitting: Gynecologic Oncology

## 2023-06-09 ENCOUNTER — Inpatient Hospital Stay: Payer: Managed Care, Other (non HMO)

## 2023-06-09 ENCOUNTER — Inpatient Hospital Stay: Payer: 59 | Attending: Gynecologic Oncology | Admitting: Gynecologic Oncology

## 2023-06-09 ENCOUNTER — Encounter: Payer: Self-pay | Admitting: Hematology and Oncology

## 2023-06-09 VITALS — BP 130/73 | HR 105 | Temp 97.7°F | Resp 16 | Ht 59.0 in | Wt 127.8 lb

## 2023-06-09 DIAGNOSIS — Z9079 Acquired absence of other genital organ(s): Secondary | ICD-10-CM | POA: Insufficient documentation

## 2023-06-09 DIAGNOSIS — Z90710 Acquired absence of both cervix and uterus: Secondary | ICD-10-CM | POA: Diagnosis not present

## 2023-06-09 DIAGNOSIS — Z8543 Personal history of malignant neoplasm of ovary: Secondary | ICD-10-CM | POA: Diagnosis not present

## 2023-06-09 DIAGNOSIS — Z90722 Acquired absence of ovaries, bilateral: Secondary | ICD-10-CM | POA: Diagnosis not present

## 2023-06-09 DIAGNOSIS — Z9221 Personal history of antineoplastic chemotherapy: Secondary | ICD-10-CM | POA: Insufficient documentation

## 2023-06-09 DIAGNOSIS — C569 Malignant neoplasm of unspecified ovary: Secondary | ICD-10-CM

## 2023-06-09 DIAGNOSIS — F419 Anxiety disorder, unspecified: Secondary | ICD-10-CM | POA: Diagnosis not present

## 2023-06-09 LAB — CBC WITH DIFFERENTIAL/PLATELET
Abs Immature Granulocytes: 0.04 10*3/uL (ref 0.00–0.07)
Basophils Absolute: 0.1 10*3/uL (ref 0.0–0.1)
Basophils Relative: 1 %
Eosinophils Absolute: 0.1 10*3/uL (ref 0.0–0.5)
Eosinophils Relative: 1 %
HCT: 39.9 % (ref 36.0–46.0)
Hemoglobin: 13.7 g/dL (ref 12.0–15.0)
Immature Granulocytes: 0 %
Lymphocytes Relative: 19 %
Lymphs Abs: 2.3 10*3/uL (ref 0.7–4.0)
MCH: 32.2 pg (ref 26.0–34.0)
MCHC: 34.3 g/dL (ref 30.0–36.0)
MCV: 93.9 fL (ref 80.0–100.0)
Monocytes Absolute: 1.2 10*3/uL — ABNORMAL HIGH (ref 0.1–1.0)
Monocytes Relative: 10 %
Neutro Abs: 8.6 10*3/uL — ABNORMAL HIGH (ref 1.7–7.7)
Neutrophils Relative %: 69 %
Platelets: 351 10*3/uL (ref 150–400)
RBC: 4.25 MIL/uL (ref 3.87–5.11)
RDW: 12.6 % (ref 11.5–15.5)
WBC: 12.3 10*3/uL — ABNORMAL HIGH (ref 4.0–10.5)
nRBC: 0 % (ref 0.0–0.2)

## 2023-06-09 LAB — COMPREHENSIVE METABOLIC PANEL WITH GFR
ALT: 29 U/L (ref 0–44)
AST: 22 U/L (ref 15–41)
Albumin: 4.7 g/dL (ref 3.5–5.0)
Alkaline Phosphatase: 60 U/L (ref 38–126)
Anion gap: 3 — ABNORMAL LOW (ref 5–15)
BUN: 15 mg/dL (ref 6–20)
CO2: 28 mmol/L (ref 22–32)
Calcium: 9.1 mg/dL (ref 8.9–10.3)
Chloride: 108 mmol/L (ref 98–111)
Creatinine, Ser: 0.71 mg/dL (ref 0.44–1.00)
GFR, Estimated: >60 mL/min (ref >60)
Glucose, Bld: 110 mg/dL — ABNORMAL HIGH (ref 70–99)
Potassium: 3.8 mmol/L (ref 3.5–5.1)
Sodium: 139 mmol/L (ref 135–145)
Total Bilirubin: 0.4 mg/dL (ref 0.0–1.2)
Total Protein: 7.2 g/dL (ref 6.5–8.1)

## 2023-06-09 MED ORDER — SODIUM CHLORIDE 0.9% FLUSH
10.0000 mL | Freq: Once | INTRAVENOUS | Status: AC
  Administered 2023-06-09: 10 mL

## 2023-06-09 MED ORDER — HEPARIN SOD (PORK) LOCK FLUSH 100 UNIT/ML IV SOLN
500.0000 [IU] | Freq: Once | INTRAVENOUS | Status: AC
  Administered 2023-06-09: 500 [IU]

## 2023-06-09 NOTE — Patient Instructions (Signed)
 It was good to see you today.  I do not see or feel any evidence of cancer recurrence on your exam.  I will see you for follow-up in 4 months.  As always, if you develop any new and concerning symptoms before your next visit, please call to see me sooner.

## 2023-06-09 NOTE — Progress Notes (Signed)
 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 21.1 by 10.9 by 18.7 cm abdominopelvic mass, with a satellite mass in the right upper quadrant anteriorly, and moderate ascites with tumor deposits along the pelvic ascites posteriorly. The mass could be arising from right or left ovary, or both. The mass displaces the bowel and IVC, but there is no IVC thrombus or pelvic DVT identified. 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 2. Additional satellite mass above the liver is partially included, along with tumor deposits along the ascites. 3. Low-level edema along the lateral abdominal wall musculature. 4. Flattening of the IVC at the level of the aortic bifurcation due to the large abdominopelvic tumor. No findings of pelvic DVT.     12/07/2021 Imaging   CT abdomen and pelvis 1. Large central abdominal and pelvic mass consistent with known malignancy. This is incompletely visualized by this CT of the abdomen, but was seen on recent pelvic MRI. 2. 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 Right omental implant consistent with metastatic disease. Small amount of abdominal ascites. 3. No evidence for bowel or ureteral obstruction. 4. Aortic Atherosclerosis (ICD10-I70.0). 5. Chest findings dictated separately.   12/07/2021 Imaging   CT chest 1. Negative for pulmonary embolism. And no acute or metastatic process identified in the Chest. 2. Partially visible liver mass and ascites, staging CT Abdomen today is reported separately.     12/07/2021 Procedure   Successful  placement of a right internal jugular approach power injectable Port-A-Cath. The catheter is ready for immediate use.   12/07/2021 Procedure   Ultrasound-guided biopsy of right upper quadrant mass.    12/08/2021 Initial Diagnosis   Ovarian cancer (HCC)   12/08/2021 Cancer Staging   Staging form: Ovary, Fallopian Tube, and Primary Peritoneal Carcinoma, AJCC 8th Edition - Clinical stage from 12/08/2021: FIGO Stage IIIC (cT3c, cN0, cM0) - Signed by Almeda Jacobs, MD on 12/10/2021 Stage prefix: Initial diagnosis   12/20/2021 - 12/28/2021 Chemotherapy   Patient is on Treatment Plan : Ovarian germ cell tumor q21d x 3 Cycles     01/03/2022 Imaging   IMPRESSION: 1. Large central abdominopelvic mass compatible with malignancy, 3450 cubic cm in volume. This may be arising from the ovaries with slight eccentricity to the right in the pelvis. 2. Mild increase in size of the right omental tumor deposit. 3. Mild increase in size of the subcapsular mass along the dome of the right hepatic lobe, with suspected hepatic invasion. 4. Scattered malignant ascites. 5. No dilated bowel to suggest obstruction. 6. 2 mm left kidney lower pole nonobstructive renal calculus. 7. Mild abdominal aortic atherosclerotic vascular disease.   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  Postoperative Diagnosis: Status post R0 cytoreductive surgery and heating intraperitoneal chemotherapy  Procedures: LAPAROSCOPY, ABDOMEN, PERITONEUM, & OMENTUM, DIAGNOSTIC, W/WO COLLECTION SPECIMEN(S) BY BRUSHING OR WASHING  EXCISION/DESTRUCTION, OPEN, INTRA-ABD TUMOR/CYST/ENDOMETRIOMA, 1+ PERITONEAL/MESENTERI/RETROPERIT 5CM OR <COLECTOMY, PARTIAL; WITH ANASTOMOSIS COLOSTOMY OR SKIN LEVEL CECOSTOMY SPLENECTOMY; TOT (SEPART PROC) EXPLORATORY LAPAROTOMY, EXPLORATORY CELIOTOMY WITH OR WITHOUT BIOPSY(S)  HYPERTHERMIA, EXTERNALLY GENERATED; DEEP CHEMOTHERAPY  ADMINISTRATION INTO THE PERITONEAL CAVITY VIA INDWELLING PORT OR CATHETER RESECTION (INITIAL) OVARIAN, TUBAL/PRIM PERITONEAL MALIG W/BIL S&O/OMENTECT; W/RAD DISSECTION FOR DEBULKING CHOLECYSTECTOMY APPENDECTOMY OMENTECTOMY, EPIPLOECTOMY, RESECTION OF OMENTUM Procedures: Exploratory laparotomy, total abdominal hysterectomy with radical dissection, bilateral ureterolysis, bilateral salpingo-oophorectomy, posterior cul-de-sac peritonectomy, tumor debulking by Dr Orvil Bland with Gynecology Oncology Dr Karenann Other with surgical oncology: Diagnostic laparoscopy, exploratory laparotomy, low anterior resection with primary end-to-end anastomosis, enterolysis, cholecystectomy, tumor capsule tumor debulking, splenectomy, appendectomy, omentectomy, diverting loop ileostomy formation  Surgeon: Wiley Hanger, MD  Findings: On diagnostic laparoscopy, only able to clearly visualize the left upper quadrant. Adhesions of bowel in the right upper quadrant limiting survey, minimal ascites noted in the pelvis, scattered tumor plaques visualized on left diaphragm. Given these findings, the decision was made to convert to exploratory laparotomy. Multiple loops of small bowel encased in a Cancn like structure in the midline with multiple filmy adhesions tethering to the superior aspect of the a large pelvic mass. Multicystic and necrotic pelvic mass originating from left ovary, approximately 18 x 20 cm incorporated within the left broad ligament with the left tube draped overlying the left ovarian mass. Bilateral tubes normal in appearance, normal-appearing uterus with smooth serosa. Right adnexa similarly with approximate 10 x 8 multicystic and necrotic mass. Diffuse tumor plaques encompassing posterior cul-de-sac peritoneum. Rectosigmoid densely adhered to posterior aspect of left adnexal mass requiring dissection and ultimately resection. Scattered treated subcentimeter nodules along small bowel mesentery. 5 x 5 cm necrotic and  multicystic tumor at the level of the hepatic flexure. Enlarged similar multicystic necrotic tumor adherent between the right diaphragm and the liver without any evidence of liver invasion and tumor encased solely within the liver capsule. Multiple tumor nodules along the spleen requiring splenectomy. Omentum overall fairly normal in appearance with only scattered small tumor deposits, no omental cake was encountered. Please see surgical oncology operative note for further details regarding operative findings for their portion of the procedure.  Specimens: ID Type Source Tests Collected by Time Destination 1 : Abdominal fat pad Tissue Abdomen SURGICAL PATHOLOGY EXAM Florina Husbands, MD 02/15/2022 [phone] 2 : Left adnexa Tissue Abdomen SURGICAL PATHOLOGY EXAM Florina Husbands, MD 02/15/2022 [phone] 3 : right adnexa Tissue Abdomen SURGICAL PATHOLOGY EXAM Florina Husbands, MD 02/15/2022 [phone] 4 : Left perimetrium Tissue Uterus SURGICAL PATHOLOGY EXAM Suzi Essex, MD 02/15/2022 [phone] 5 : Uterus and cervix Tissue Uterus SURGICAL PATHOLOGY EXAM Florina Husbands, MD 02/15/2022 1018 6 : Low Anterior Resection Tissue Abdomen SURGICAL PATHOLOGY EXAM Florina Husbands, MD 02/15/2022 1055 7 : Right peritoneum Tissue Abdomen SURGICAL PATHOLOGY EXAM Florina Husbands, MD 02/15/2022 1058 8 : Right retroperitoneal mass Tissue Abdomen SURGICAL PATHOLOGY EXAM Florina Husbands, MD 02/15/2022 1101 9 : Omentum Tissue Omentum SURGICAL PATHOLOGY EXAM Florina Husbands, MD 02/15/2022 1130 10 : Gallbladder Tissue Gallbladder SURGICAL PATHOLOGY EXAM Florina Husbands, MD 02/15/2022 1143 11 : Right diaphragm Tissue Diaphragm SURGICAL PATHOLOGY EXAM Florina Husbands, MD 02/15/2022 1143 12 : Left diaphragm nodule Tissue Diaphragm SURGICAL PATHOLOGY EXAM Florina Husbands, MD 02/15/2022 1157 13 : Spleen Tissue Spleen SURGICAL PATHOLOGY EXAM Florina Husbands, MD  02/15/2022 1204 14 : Appendix Tissue Appendix SURGICAL PATHOLOGY EXAM Florina Husbands, MD 02/15/2022 1213 15 : Small bowel mesenteric nodules Tissue Small Bowel SURGICAL PATHOLOGY EXAM Florina Husbands, MD 02/15/2022 1215 16 : Colon mesenteric nodule Tissue Colon SURGICAL PATHOLOGY EXAM Florina Husbands, MD 02/15/2022 1221 17 : Hepatic flexure mass  Tissue Liver SURGICAL PATHOLOGY EXAM Florina Husbands, MD 02/15/2022 1227  Complications: None  Indications for Procedure: [name] is a 53 y.o. 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 Prior to the procedure, all risks, benefits, and alternatives were discussed and informed surgical consent was signed.  Procedure: Patient was taken to the operating room where general anesthesia was achieved. She was positioned in dorsal lithotomy and prepped and draped per the surgical oncology team. A foley catheter was inserted into the bladder. A 5 mm skin incision was made in the left upper quadrant at Palmer's point, the abdomen was then entered with direct Optiview entry and the abdomen was insufflated. Findings were noted as above.  The laparoscope was then removed from the abdomen, the pneumoperitoneum was maintained. A vertical midline incision was made with the scalpel and the abdomen was entered sharply. The abdomen and pelvis were surveyed with findings as documented above. A wound protector was placed followed by the [facility] retractor.  The enlarged left adnexal mass was then carefully removed of its bilateral sidewall attachments using a mixture of blunt and cautery dissection. Multiple small bowel loops were adhered to the superior and posterior aspect of the mass with filmy adhesions which were carefully bluntly resected. The rectosigmoid was noted to be densely adhered to the posterior aspect of the pelvic mass. Dissection was attempted  using mixture of blunt dissection as well as sharp dissection with the Metzenbaum scissors, and unavoidable enterotomy of approximately 4 cm was made to the anterior surface of the rectosigmoid. This enterotomy was then closed with 4-0 PDS in a running fashion to minimize contamination. Further dissection removing the remainder of the rectosigmoid from the posterior aspect of the pelvic mass was then undertaken. The pelvic mass was then able to be fully elevated from the pelvis. The left infundibulopelvic ligament was able to be isolated with the left ureter clearly visualized below. The left ureter was further dissected away from the medial leaf of the broad ligament, and a vessel loop was placed. The left infundibulopelvic ligament was then isolated, clamped, and transected using the LigaSure device as well as and suture-ligated. The left utero-ovarian ligament was then isolated, clamped, cauterized and transected. The left pelvic mass was then removed entirely.  Attention was then turned to the right. An additional enlarged multicystic right adnexal mass was noted, this was carefully elevated from the pelvis using blunt dissection. The right round ligament was then grasped, elevated, and transected. The broad ligament was then opened posteriorly. The ureter was identified and the right infundibulopelvic ligament was then isolated, clamped, transected, and tied. The right utero ovarian ligament was then isolated, clamped, and transected using the LigaSure device and the right adnexal mass was removed.  Attention then was turned toward the hysterectomy. A sponge stick was placed within the vagina. The right broad ligament dissection was opened anteriorly and the bladder flap was developed. The left round ligament was then further transected, and the broad ligament opened posteriorly and anteriorly to complete the bladder flap. The right uterine artery was skeletonized, clamped, transected, and suture-ligated.  There was significant parametrial tumor involvement on the left side, and therefore a radical dissection was required to isolate the blood supply. The left ureter was further dissected down to the level near the trigone. The left uterine artery was traced to its origin, isolated, and clamped with vascular clips. Similarly the left uterine  vein due to significant necrotic tumor involvement was friable. Detachment was similarly carefully isolated, and clipped with vascular clips. Sequential clamps were then used to transect the remainder of the broad and cardinal ligaments bilaterally, with each pedicle being suture-ligated. A large right angle clamp was then able to be placed below the cervix, and the uterus and cervix were amputated from the vagina. The vaginal cuff was then closed with several figure-of-eight sutures using 0 Vicryl.    02/15/2022 Pathology Results   A: Abdominal fat pad, excision - Fibroadipose tissue with residual mixed malignant germ cell tumor with treatment effect   B: Left adnexa, salpingo-oophorectomy - Residual mixed malignant germ cell tumor with treatment effect, [id number] (See Comment and Synoptic report) - FIGO stage IIIC - Fallopian tubes with no tumor present   C: Right adnexa, salpingo-oophorectomy - Residual mixed malignant germ cell tumor with treatment effect (See Synoptic report) - Fallopian tubes not identified   D: Left parametrium, biopsy - Fibroadipose tissue with residual mixed malignant germ cell tumor with treatment effect   E: Uterus and cervix, hysterectomy - Right adnexal soft tissue with residual mixed malignant germ cell tumor with treatment effect   Myometrium: - Adenomyosis - Leiomyomata measuring up to 0.7 cm - Extensive chronic serositis   Cervix:  - Ectocervix and endocervix with chronic cervicitis    Endometrium: - Inactive endometrium   F: Low anterior resection - Residual mixed malignant  germ cell tumor with treatment effect  involving colonic serosa and pericolonic adipose tissue - Proximal and distal colonic resection margins with chronic serositis, but negative for tumor    G: Right peritoneum, biopsy - Fibroadipose tissue with residual mixed malignant germ cell tumor with treatment effect   H: Right retroperitoneal mass, excision - Fibroadipose tissue with residual mixed malignant germ cell tumor with treatment effect   I: Omentum, omentectomy - Fibroadipose tissue with residual mixed malignant germ cell tumor with treatment effect   J: Gallbladder, cholecystectomy - Gallbladder with mild chronic serositis and fibrous adhesion   K: Right diaphragm, excision - Fibrous tissue with residual mixed malignant germ cell tumor with treatment effect - Hepatic tissue with no tumor present   L: Left diaphragm nodule, biopsy - Fibrous tissue with residual mixed malignant germ cell tumor with treatment effect   M: Spleen, splenectomy - Residual mixed malignant germ cell tumor with treatment effect involving splenic capsule   N: Appendix, appendectomy - Appendix with fibrous obliteration at the tip - Periappendiceal adipose tissue with acute and chronic inflammation   O: Small bowel mesenteric nodules, excision - Fibroadipose tissue with residual mixed malignant germ cell tumor with treatment effect   P: Mesenteric nodules, excision - Fibroadipose tissue with residual mixed malignant germ cell tumor with treatment effect   Q: Hepatic flexure mass, excision - Fibroadipose tissue with residual mixed malignant germ cell tumor with treatment effect   R: Anvil and donut, biopsy - Benign segment of colon with acute and chronic inflammation and fibrous adhesion  SPECIMEN    Procedure:    Radical hysterectomy     Procedure:    Omentectomy     Procedure:    Peritoneal tumor debulking     Hysterectomy Type:    Abdominal     Specimen Integrity:          Right Ovary Integrity:    Capsule intact     Specimen  Integrity:          Left Ovary Integrity:    Capsule intact  TUMOR    Tumor Site:    Bilateral ovaries     Tumor Size:    Greatest Dimension (Centimeters): 19.5 cm     Histologic Type:    Mixed malignant germ cell tumor:  yolk sac tumor, mature teratoma, mature neural elements, immature neural tissue, and immature cartilage     Ovarian Surface Involvement:    Present, right and left     Other Tissue / Organ Involvement:    Right ovary     Other Tissue / Organ Involvement:    Left ovary     Other Tissue / Organ Involvement:    Right fallopian tube     Other Tissue / Organ Involvement:    Omentum     Other Tissue / Organ Involvement:    Diaphragm and splenic capsule     Largest Extrapelvic Peritoneal Focus:    Macroscopic (greater than 2 cm): Splenic capsule     Peritoneal / Ascitic Fluid Involvement:    Not submitted / unknown   REGIONAL LYMPH NODES     Regional Lymph Node Status:    Not applicable (no regional lymph nodes submitted or found)   DISTANT METASTASIS     Distant Site(s) Involved:    Diaphragm, splenic capsule   pTNM CLASSIFICATION (AJCC 8th Edition)     Reporting of pT, pN, and (when applicable) pM categories is based on information available to the pathologist at the time the report is issued. As per the AJCC (Chapter 1, 8th Ed.) it is the managing physician's responsibility to establish the final pathologic stage based upon all pertinent information, including but potentially not limited to this pathology report.     Modified Classification:    y     pT Category:    pT3c     pN Category:    pN not assigned (no nodes submitted or found)     pM Category:    pM1   FIGO STAGE     FIGO Stage:    IIIC   COMMENT:  The needle core biopsies show a heterogeneous combination of elements including abundant neural tissue with atypical and immature features. There are also atypical glandular epithelial structures and focal yolk sac elements with microcystic pattern.  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  Immunohistochemistry is performed for cytokeratin AE1/AE3, CD117, CD30, G FAP, glypican-3, OCT3/4, synaptophysin, S100 and CD30.     03/14/2022 - 05/02/2022 Chemotherapy   Patient is on Treatment Plan : Malignant germ cell tumor of ovary D1-5 q21d     03/22/2022 Tumor Marker   Patient's tumor was tested for the following markers: AFP. Results of the tumor marker test revealed 7.9.   04/23/2022 Genetic Testing   Negative genetic testing on the CancerNext-Expanded+RNAinsight panel.  The report date is April 23, 2022.  The CancerNext-Expanded gene panel offered by [facility] and includes sequencing and rearrangement analysis for the following 77 genes: AIP, ALK, APC*, ATM*, AXIN2, BAP1, BARD1, BMPR1A, BRCA1*, BRCA2*, BRIP1*, CDC73, CDH1*, CDK4, CDKN1B, CDKN2A, CHEK2*, CTNNA1, DICER1, FH, FLCN, KIF1B, LZTR1, MAX, MEN1, MET, MLH1*, MSH2*, MSH3, MSH6*, MUTYH*, NF1*, NF2, NTHL1, PALB2*, PHOX2B, PMS2*, POT1, PRKAR1A, PTCH1, PTEN*, RAD51C*, RAD51D*, RB1, RET, SDHA, SDHAF2, SDHB, SDHC, SDHD, SMAD4, SMARCA4, SMARCB1, SMARCE1, STK11, SUFU, TMEM127, TP53*, TSC1, TSC2, and VHL (sequencing and deletion/duplication); EGFR, EGLN1, HOXB13, KIT, MITF, PDGFRA, POLD1, and POLE (sequencing only); EPCAM and GREM1 (deletion/duplication only). DNA and RNA analyses performed  for * genes.    05/17/2022 Imaging   CT ABDOMEN PELVIS W WO CONTRAST  Result Date: 05/17/2022 CLINICAL DATA:  History of ovarian cancer status post recent section and completion of chemotherapy 1-1/2 weeks ago. Evaluation of possible ileostomy reversal. EXAM: CT ABDOMEN AND PELVIS WITHOUT AND WITH CONTRAST TECHNIQUE: Multidetector CT imaging of the abdomen and pelvis was performed following the standard protocol before and following the bolus administration of intravenous contrast. RADIATION DOSE REDUCTION: This exam was performed according to the  departmental dose-optimization program which includes automated exposure control, adjustment of the mA and/or kV according to patient size and/or use of iterative reconstruction technique. CONTRAST:  OMNIPAQUE  IOHEXOL  300 MG/ML  SOLN COMPARISON:  CT abdomen and pelvis dated 02/09/2022 FINDINGS: Lower chest: No focal consolidation or pulmonary nodule in the lung bases. No pleural effusion or pneumothorax demonstrated. Partially imaged heart size is normal. Hepatobiliary: Postsurgical changes of the right hepatic dome with irregular capsular retraction. Punctate adjacent focus of subdiaphragmatic calcification (11:32). Unchanged subcentimeter hypoattenuating focus along posterior segment 7 (4:18). No intra or extrahepatic biliary ductal dilation. Cholecystectomy. Pancreas: No focal lesions or main ductal dilation. Spleen: Splenectomy. Lobulated, enhancing soft tissue density along the left hemidiaphragm at the splenectomy site measures 1.5 x 1.0 cm (4:11). Adrenals/Urinary Tract: No adrenal nodules. No suspicious renal mass or hydronephrosis. Punctate nonobstructing left lower pole stones. No focal bladder wall thickening. Stomach/Bowel: Normal appearance of the stomach. Right upper quadrant ileostomy. Enteric contrast material is present within the medial limb of the ileostomy. Appendectomy. Vascular/Lymphatic: Aortic atherosclerosis. No enlarged abdominal or pelvic lymph nodes. Reproductive: Hysterectomy and bilateral salpingo-oophorectomy. Ill-defined soft tissue near the left vaginal (4:66). No adnexal masses. Other: Status post omentectomy. Ill-defined lateral peritoneal thickening, for example series 4 image 38 on the right and image 36 on the left associated with scattered foci of mesenteric nodularity, for example 7 x 5 mm (4:34). Musculoskeletal: No acute or abnormal lytic or blastic osseous lesions. Postsurgical changes of the anterior abdominal wall. IMPRESSION: 1. Extensive postsurgical changes of  the abdomen and pelvis. Right upper quadrant ileostomy with enteric contrast material present within the medial limb of the ileostomy. No evidence of obstruction. 2. Multifocal ill-defined soft tissue densities as described, indeterminate, may reflect postsurgical change versus residual tumor. 3. Lobulated, enhancing soft tissue density along the left hemidiaphragm at the splenectomy site may represent residual/recurrent splenic tissue versus residual tumor, although the enhancement pattern does not appear consistent with pre-surgical appearance of the malignancy. 4.  Aortic Atherosclerosis (ICD10-I70.0). Electronically Signed   By: Limin  Xu M.D.   On: 05/17/2022 16:43      06/09/2022 Imaging   CT ABDOMEN PELVIS WO CONTRAST  Result Date: 06/09/2022 CLINICAL DATA:  Ovarian cancer, status post chemotherapy. Rectal contrast administered to evaluate for anastomotic patency prior to reversal. EXAM: CT ABDOMEN AND PELVIS WITHOUT CONTRAST TECHNIQUE: Multidetector CT imaging of the abdomen and pelvis was performed following the standard protocol without IV contrast. RADIATION DOSE REDUCTION: This exam was performed according to the departmental dose-optimization program which includes automated exposure control, adjustment of the mA and/or kV according to patient size and/or use of iterative reconstruction technique. COMPARISON:  05/17/2022 FINDINGS: Lower chest: Lung bases are clear. Hepatobiliary: Unenhanced liver is unremarkable. Status post cholecystectomy. No intrahepatic or extrahepatic ductal dilatation. Pancreas: Within normal limits. 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 Adrenals/Urinary Tract: Adrenal glands are within normal limits. Right kidney is within normal limits. 3 mm nonobstructing left lower pole renal  calculus (series 2/image 22). No hydronephrosis. Bladder is within normal limits. Stomach/Bowel: Stomach is within normal limits. No evidence of bowel obstruction.  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 Status post left hemicolectomy with suture line in the lower pelvis (series 2/image 87). Rectal contrast confirms a patent anastomosis without evidence of leak. Vascular/Lymphatic: No evidence of abdominal aortic aneurysm. Atherosclerotic calcifications of the abdominal aorta and branch vessels. No suspicious abdominopelvic lymphadenopathy. Reproductive: Status post hysterectomy and suspected bilateral salpingo oophorectomy. Other: No abdominopelvic ascites. Mild peritoneal nodularity along the lateral left mid abdomen, with discrete 8 mm nodule (series 2/image 33), more conspicuous than on the prior. Mild nonspecific mesenteric stranding along the jejunal mesentery in central left mid abdomen (series 2/image 30). Mildly prominent soft tissue along the left pelvic cul-de-sac measuring up to 2.0 cm (series 2/image 59), grossly unchanged. Overall appearance suggests very mild peritoneal disease which is overall grossly unchanged. Musculoskeletal: Visualized osseous structures are within normal limits. 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 Rectal contrast confirms a patent anastomosis without evidence of leak. 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 Additional ancillary findings as above. Electronically Signed   By: Zadie Herter M.D.   On: 06/09/2022 01:59   CT ABDOMEN PELVIS W WO CONTRAST  Result Date: 05/17/2022 CLINICAL DATA:  History of ovarian cancer status post recent section and completion of chemotherapy 1-1/2 weeks ago. Evaluation of possible ileostomy reversal. EXAM: CT ABDOMEN AND PELVIS WITHOUT AND WITH CONTRAST TECHNIQUE: Multidetector CT imaging of the abdomen and pelvis was performed following the standard protocol before and following the bolus administration of intravenous contrast. RADIATION DOSE REDUCTION: This exam was performed according to the  departmental dose-optimization program which includes automated exposure control, adjustment of the mA and/or kV according to patient size and/or use of iterative reconstruction technique. CONTRAST:  OMNIPAQUE  IOHEXOL  300 MG/ML  SOLN COMPARISON:  CT abdomen and pelvis dated 02/09/2022 FINDINGS: Lower chest: No focal consolidation or pulmonary nodule in the lung bases. No pleural effusion or pneumothorax demonstrated. Partially imaged heart size is normal. Hepatobiliary: Postsurgical changes of the right hepatic dome with irregular capsular retraction. Punctate adjacent focus of subdiaphragmatic calcification (11:32). Unchanged subcentimeter hypoattenuating focus along posterior segment 7 (4:18). No intra or extrahepatic biliary ductal dilation. Cholecystectomy. Pancreas: No focal lesions or main ductal dilation. Spleen: Splenectomy. Lobulated, enhancing soft tissue density along the left hemidiaphragm at the splenectomy site measures 1.5 x 1.0 cm (4:11). Adrenals/Urinary Tract: No adrenal nodules. No suspicious renal mass or hydronephrosis. Punctate nonobstructing left lower pole stones. No focal bladder wall thickening. Stomach/Bowel: Normal appearance of the stomach. Right upper quadrant ileostomy. Enteric contrast material is present within the medial limb of the ileostomy. Appendectomy. Vascular/Lymphatic: Aortic atherosclerosis. No enlarged abdominal or pelvic lymph nodes. Reproductive: Hysterectomy and bilateral salpingo-oophorectomy. Ill-defined soft tissue near the left vaginal (4:66). No adnexal masses. Other: Status post omentectomy. Ill-defined lateral peritoneal thickening, for example series 4 image 38 on the right and image 36 on the left associated with scattered foci of mesenteric nodularity, for example 7 x 5 mm (4:34). Musculoskeletal: No acute or abnormal lytic or blastic osseous lesions. Postsurgical changes of the anterior abdominal wall. IMPRESSION: 1. Extensive postsurgical changes of  the abdomen and pelvis. Right upper quadrant ileostomy with enteric contrast material present within the medial limb of the ileostomy. No evidence of obstruction. 2. Multifocal ill-defined soft tissue densities as described, indeterminate, may reflect postsurgical change versus residual tumor. 3. Lobulated, enhancing  soft tissue density along the left hemidiaphragm at the splenectomy site may represent residual/recurrent splenic tissue versus residual tumor, although the enhancement pattern does not appear consistent with pre-surgical appearance of the malignancy. 4.  Aortic Atherosclerosis (ICD10-I70.0). Electronically Signed   By: Limin  Xu M.D.   On: 05/17/2022 16:43      07/01/2022 Surgery   Pre-operative Diagnosis: Ileostomy, planned reversal   Post-operative Diagnosis: same as above   Operation: Ileostomy takedown   Surgeon: Wiley Hanger MD    Operative Findings: Normal-appearing loop ileostomy with some mild retraction.  Some filmy adhesions around the loop of ileum itself and its mesentery.  Fascia easily identified and normal in appearance.  Ileum freed from surrounding anterior abdominal wall peritoneum.     08/09/2022 Tumor Marker   Patient's tumor was tested for the following markers: CA-125. Results of the tumor marker test revealed 12.6.   08/10/2022 Tumor Marker   Patient's tumor was tested for the following markers: AFP. Results of the tumor marker test revealed 3.   10/06/2022 Imaging   CT A/P: No acute abdominal or pelvic pathology.    01/19/2023 Imaging   CT A/P: 1. New subtle 6 mm hypodensity in the left lobe of the liver, nonspecific consider attention on short-term interval follow-up CT or potentially more definitive characterization by pre and postcontrast enhanced abdominal MRI. 2. No other evidence to suggest metastatic disease in the abdomen or pelvis. 3. Punctate nonobstructive left lower pole renal stones. 4. Moderate volume of formed stool in the  colon. 5.  Aortic Atherosclerosis (ICD10-I70.0).   01/20/2023 Tumor Marker   Patient's tumor was tested for the following markers: CA-125, AFP and HCG. Results of the tumor marker test revealed CA-125 11.5, AFP 4.1, HCG <1.   01/23/2023 Imaging   MRI liver: 1. No solid liver abnormality is seen. Specifically, no mass or suspicious abnormality in the central left lobe of the liver to correspond to finding of prior CT. 2. Status post splenectomy. Unchanged postoperative soft tissue in the left upper quadrant. 3. Hepatic parenchymal iron deposition. 4. Large burden of stool throughout the colon.   04/18/2023 Tumor Marker   Patient's tumor was tested for the following markers: CA-125, AFP and HCG. Results of the tumor marker test revealed CA-125 11.2, AFP 3.7 HCG <1.       Interval History: Doing okay.  Has a lot of anxiety and feelings of PTSD around her diagnosis, especially coming into the cancer center.  Denies any infectious symptoms including fevers, chills, dysuria.  Reports baseline bowel function, intermittent constipation.  Denies any vaginal bleeding.  Past Medical/Surgical History: Past Medical History:  Diagnosis Date   Arthritis    Endometriosis    Family history of breast cancer    Family history of breast cancer in mother    at age 94   GAD (generalized anxiety disorder)    lexapro helped 2017 (Dr. Cosette Dinning did have extra stress of the Master's degree program on her at that time.  Got counseling.       GERD (gastroesophageal reflux disease)    Gestational diabetes    History of kidney stones    Low back pain    Dr. Zach Smith.   Neck pain    and left shoulder pain: managed by osteopathic manipulation by Dr. Napolean Backbone. ?cervical radiculopathy   ovarian ca 12/01/2021   PAC (premature atrial contraction)    PONV (postoperative nausea and vomiting)     Past Surgical History:  Procedure Laterality  Date   ABDOMINAL EXPLORATION SURGERY  2009   fulguration of  endometriosis - [facility] surgery   DEBULKING  02/15/2022   revoved uterus and ovaries, omentun, gallbladder appendix, spleen  and 6 inches of colon   ILEOSTOMY  02/15/2022   ILEOSTOMY CLOSURE N/A 06/22/2022   Procedure: ILEOSTOMY TAKEDOWN;  Surgeon: Suzi Essex, MD;  Location: [facility];  Service: General;  Laterality: N/A;   IR IMAGING GUIDED PORT INSERTION  12/08/2021   IR US  GUIDE BX ASP/DRAIN  12/08/2021   SALPINGECTOMY Right 2009   TONSILLECTOMY     TUBAL LIGATION Left 2009    Family History  Problem Relation Age of Onset   Rheum arthritis Mother    Stroke Mother    Hypertension Mother    Heart disease Mother    Arthritis Mother    Breast cancer Mother 86   Heart attack Mother 77   Diabetes Father    Heart disease Father    Hyperlipidemia Father    Hypertension Father    Heart attack Father 46   Non-Hodgkin's lymphoma Paternal Uncle 14   Heart disease Maternal Grandmother    Heart attack Maternal Grandmother 87   Arthritis Maternal Grandmother    Early death Maternal Grandfather 30       MVA   Diabetes Paternal Grandmother    Hypertension Paternal Grandmother    Heart disease Paternal Grandmother    Heart attack Paternal Grandmother 42   Heart disease Paternal Grandfather    Heart attack Paternal Grandfather 52   Coronary artery disease Other    Glaucoma Other     Social History   Socioeconomic History   Marital status: Married    Spouse name: Not on file   Number of children: 3   Years of education: Not on file   Highest education level: Not on file  Occupational History   Occupation: Academic librarian: [employer]    Comment: at [employer]   Tobacco Use   Smoking status: Never   Smokeless tobacco: Never  Vaping Use   Vaping status: Never Used  Substance and Sexual Activity   Alcohol use: Not Currently    Comment: Occasionally   Drug use: No   Sexual activity: Yes    Partners: Male    Birth control/protection: Surgical  Other Topics Concern    Not on file  Social History Narrative   Married, 3 kids.    Lives in [place].   Educ: Masters deg nursing.   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   No tob/drugs.   Alc: social.   Social Drivers of Health   Financial Resource Strain: Low Risk  (02/17/2022)   [facility], [facility]   Overall Financial Resource Strain (CARDIA)    Difficulty of Paying Living Expenses: Not very hard  Food Insecurity: No Food Insecurity (06/22/2022)   Hunger Vital Sign    Worried About Running Out of Food in the Last Year: Never true    Ran Out of Food in the Last Year: Never true  Transportation Needs: No Transportation Needs (06/22/2022)   PRAPARE - Administrator, Civil Service (Medical): No    Lack of Transportation (Non-Medical): No  Physical Activity: Insufficiently Active (11/18/2021)   Received from [facility], [facility]   Exercise Vital Sign    Days of Exercise per Week:  2 days    Minutes of Exercise per Session: 40 min  Stress: No Stress Concern Present (11/18/2021)   Received from [facility], [facility] - Occupational Stress Questionnaire    Feeling of Stress : Only a little  Social Connections: Unknown (11/22/2022)   Received from [facility]   Social Network    Social Network: Not on file    Current Medications:  Current Outpatient Medications:    acetaminophen  (TYLENOL ) 650 MG CR tablet, Take 650 mg by mouth every 8 (eight) hours as needed for pain., Disp: , Rfl:    ascorbic acid (VITAMIN C) 500 MG tablet, Take 500 mg by mouth daily., Disp: , Rfl:    Cholecalciferol (VITAMIN D ) 50 MCG (2000 UT) tablet, Take 2,000 Units by mouth daily., Disp: , Rfl:    cyanocobalamin  (VITAMIN B12) 1000 MCG tablet, Take 1,000 mcg by mouth daily., Disp: , Rfl:    diphenhydrAMINE  (BENADRYL ) 25 MG tablet, Take 25 mg by mouth every 6 (six)  hours as needed for allergies., Disp: , Rfl:    docusate sodium  (COLACE) 50 MG capsule, Take 50 mg by mouth 2 (two) times daily., Disp: , Rfl:    estradiol  (VIVELLE -DOT) 0.05 MG/24HR patch, Place 1 patch (0.05 mg total) onto the skin 2 (two) times a week., Disp: 8 patch, Rfl: 12   famotidine  (PEPCID ) 10 MG tablet, Take 10 mg by mouth daily as needed for heartburn or indigestion., Disp: , Rfl:    loratadine (CLARITIN) 10 MG tablet, Take by mouth., Disp: , Rfl:    LORazepam  (ATIVAN ) 0.5 MG tablet, Take 1 tablet (0.5 mg total) by mouth every 8 (eight) hours as needed for anxiety. Do not take and drive, Use with caution. Do not take with pain meds, Disp: 20 tablet, Rfl: 0   Magnesium  Glycinate 100 MG CAPS, Take 200 mg by mouth daily., Disp: , Rfl:    pyridoxine (B-6) 500 MG tablet, Take 500 mg by mouth daily., Disp: , Rfl:   Review of Systems: Denies appetite changes, fevers, chills, fatigue, unexplained weight changes. Denies hearing loss, neck lumps or masses, mouth sores, ringing in ears or voice changes. Denies cough or wheezing.  Denies shortness of breath. Denies chest pain or palpitations. Denies leg swelling. Denies abdominal distention, pain, blood in stools, constipation, diarrhea, nausea, vomiting, or early satiety. Denies pain with intercourse, dysuria, frequency, hematuria or incontinence. Denies hot flashes, pelvic pain, vaginal bleeding or vaginal discharge.   Denies joint pain, back pain or muscle pain/cramps. Denies itching, rash, or wounds. Denies dizziness, headaches, numbness or seizures. Denies swollen lymph nodes or glands, denies easy bruising or bleeding. Denies anxiety, depression, confusion, or decreased concentration.  Physical Exam: BP 130/73 (BP Location: Left Arm, Patient Position: Sitting)   Pulse (!) 105   Temp 97.7 F (36.5 C) (Oral)   Resp 16   Ht 4\' 11"  (1.499 m)   Wt 127 lb 12.8 oz (58 kg)   LMP  (LMP Unknown) Comment: tubal ligation  SpO2 100%   BMI  25.81 kg/m  General: Alert, oriented, no acute distress. HEENT: Normocephalic, atraumatic, sclera anicteric. Chest: Clear to auscultation bilaterally.  No wheezes or rhonchi. Cardiovascular: Regular rate and rhythm, no murmurs. Abdomen: soft, nontender.  Normoactive bowel sounds.  No masses or hepatosplenomegaly appreciated.  Well-healed incisions. Extremities: Grossly normal range of motion.  Warm, well perfused.  No edema bilaterally. Skin: No rashes or lesions noted. Lymphatics: No cervical, supraclavicular, or inguinal adenopathy.  GU: Normal appearing external genitalia without erythema, excoriation, or lesions.  Speculum exam reveals mildly atrophic vaginal mucosa, no lesions noted.  Bimanual exam reveals smooth, no masses or nodularity.    Laboratory & Radiologic Studies: CT C/A/P: 04/17/23 IMPRESSION: 1. Stable examination without convincing evidence of local recurrence or metastatic disease within the chest, abdomen or pelvis. 2. Ill-defined soft tissue in the left vaginal cuff measures 2.7 x 1.4 x 1.2 cm, stable over multiple prior examinations and favored to reflect postsurgical change. Continued attention on follow-up imaging suggested. 3. Stable 2 mm triangular nodule along the left major fissure is favored to reflect a benign intrapulmonary lymph node. Suggest attention on short-term interval follow-up chest CT. 4. Colonic stool burden compatible with constipation.  Assessment & Plan: [name] is a 53 y.o. 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 She has completed 3 cycles of adjuvant chemotherapy with etoposide  and cisplatin . She is s/p ileostomy reversal on 06/22/2022.   The patient is overall doing very well and is NED on exam today.   Last CT imaging 04/2023 without disease recurrence.  Next imaging will be in late June.   Recent labs  (CA-125, AFP and hcg) all normal.  Patient's preference was not to have these redrawn today and plan to have them next time she sees Dr. Marton Sleeper.   Menopausal symptoms almost resolved with addition of estrogen replacement therapy.  Discussed her anxiety.  She has a therapist, with whom she speaks regularly.  Discussed some of the resources at the cancer center and asked if I could send her name to our social work team.  He was amenable to this.  Normalized some of her feelings of PTSD and anxiety related to her diagnosis.   Per NCCN surveillance recommendations, we will continue with clinic visits every 2-4 months to include an exam and tumor markers with CT imaging every 3-4 months.  Reviewed signs and symptoms that should prompt a phone call before her next scheduled visit.  22 minutes of total time was spent for this patient encounter, including preparation, face-to-face counseling with the patient and coordination of care, and documentation of the encounter.  Wiley Hanger, MD  Division of Gynecologic Oncology  Department of Obstetrics and Gynecology  [facility]

## 2023-06-12 ENCOUNTER — Inpatient Hospital Stay: Admitting: Licensed Clinical Social Worker

## 2023-06-12 NOTE — Progress Notes (Signed)
 [facility] Clinical Social Work  Clinical Social Work was referred by medical provider for assessment of psychosocial needs.  Clinical Social Worker contacted patient by phone to offer support and assess for needs.   Pt is about 1 year out from treatment and experiencing increasing anxiety and PTSD symptoms, particularly around triggers such as doctor's appointments, scans, phone calls from the cancer center, etc, to the point of a panic attack at her visit on Friday.  She has good self insight and is motivated to help decrease her symptoms.  Pt currently has counseling every other week with a therapist well versed  in cancer. She has also done Reiki and yoga. She has tried medication previously with too many side effects and currently has PRN Ativan .    CSW provided empathic listening and information on the frequency of PTSD in cancer survivors. Discussed additional supports that may be helpful for patient, including additional grounding skills and an app to help (Calm Harm, pt already uses Insight Timer). Recommended an additional therapeutic option for trauma such as EMDR (pt currently doing more talk processing) and sent names of therapists who can provide this modality.  Discussed upcoming GYN retreat and support group as well.  Pt expressed understanding and gratitude for the information. CSW encouraged pt to call back with any questions or other needs.     Adleigh Mcmasters E Naylin Burkle, LCSW  Clinical Social Worker [facility]

## 2023-06-17 ENCOUNTER — Other Ambulatory Visit (HOSPITAL_BASED_OUTPATIENT_CLINIC_OR_DEPARTMENT_OTHER): Payer: Self-pay

## 2023-06-20 DIAGNOSIS — F4323 Adjustment disorder with mixed anxiety and depressed mood: Secondary | ICD-10-CM | POA: Diagnosis not present

## 2023-07-03 NOTE — Progress Notes (Signed)
 [facility] [address] [place] [address] Phone: [phone] Subjective:   [name], am serving as a scribe for Dr. Ronnell Coins.  I'm seeing this patient by the request  of:  Almeda Jacobs, MD  CC: Back and neck pain follow-up  [id number]  [name] is a 53 y.o. female coming in with complaint of back and neck pain. OMT 05/19/2023. Patient states hat she has some pain in R shoulder radiating into R scapula.   Medications patient has been prescribed: None  Taking:         Reviewed prior external information including notes and imaging from previsou exam, outside providers and external EMR if available.   As well as notes that were available from care everywhere and other healthcare systems.  Past medical history, social, surgical and family history all reviewed in electronic medical record.  No pertanent information unless stated regarding to the chief complaint.   Past Medical History:  Diagnosis Date   Arthritis    Endometriosis    Family history of breast cancer    Family history of breast cancer in mother    at age 56   GAD (generalized anxiety disorder)    lexapro helped 2017 (Dr. Cosette Dinning did have extra stress of the Master's degree program on her at that time.  Got counseling.       GERD (gastroesophageal reflux disease)    Gestational diabetes    History of kidney stones    Low back pain    Dr. Zach Sariya Trickey.   Neck pain    and left shoulder pain: managed by osteopathic manipulation by Dr. Napolean Backbone. ?cervical radiculopathy   ovarian ca 12/01/2021   PAC (premature atrial contraction)    PONV (postoperative nausea and vomiting)     Allergies  Allergen Reactions   Latex Other (See Comments)    Sensitivity only   Sulfa Antibiotics Hives and Other (See Comments)   Sulfamethoxazole-Trimethoprim Rash   Septra [Bactrim] Rash     Review of Systems:  No headache, visual changes, nausea, vomiting,  diarrhea, constipation, dizziness, abdominal pain, skin rash, fevers, chills, night sweats, weight loss, swollen lymph nodes, body aches, joint swelling, chest pain, shortness of breath, mood changes. POSITIVE muscle aches  Objective  Blood pressure 110/78, pulse 98, height 4\' 11"  (1.499 m), weight 122 lb (55.3 kg), SpO2 98%.   General: No apparent distress alert and oriented x3 mood and affect normal, dressed appropriately.  HEENT: Pupils equal, extraocular movements intact  Respiratory: Patient's speak in full sentences and does not appear short of breath  Cardiovascular: No lower extremity edema, non tender, no erythema  Gait MSK:  Back shows that there is some scapular dyskinesis, significant tightness noted on the right side of the paraspinal musculature.  Multiple trigger points noted on the right side of the upper back.  Osteopathic findings  C2 flexed rotated and side bent right C6 flexed rotated and side bent right T3 extended rotated and side bent right inhaled rib   After verbal consent patient was prepped with alcohol swab and with a 25-gauge half inch needle injected into 4 distinct trigger points in the left interscapular, trapezius, rhomboid and latissimus dorsi.  Total of 3 cc of 0.5% Marcaine  and 1 cc of Kenalog  40 mg/mL used.  Minimal blood loss.  Band-Aid placed.  Postinjection instructions given.     Assessment and Plan:  Trigger point of right shoulder region Has been a significant amount  of time and since patient had some difficulty.  Discussed icing regimen of home exercises, activities to do and which ones to avoid.  Increase activity slowly.  Discussed icing regimen.  Follow-up again in 6 to 8 weeks  Situational anxiety Having a lot of family issues at the moment.  Hydroxyzine  given for any type of breakthrough anxiety that seems to be situational.    Nonallopathic problems  Decision today to treat with OMT was based on Physical Exam  After verbal consent  patient was treated with HVLA, ME, FPR techniques in cervical, rib, thoracic areas  Patient tolerated the procedure well with improvement in symptoms  Patient given exercises, stretches and lifestyle modifications  See medications in patient instructions if given  Patient will follow up in 4-8 weeks     The above documentation has been reviewed and is accurate and complete Resean Brander M Vraj Denardo, DO         Note: This dictation was prepared with Dragon dictation along with smaller phrase technology. Any transcriptional errors that result from this process are unintentional.

## 2023-07-04 ENCOUNTER — Other Ambulatory Visit (HOSPITAL_BASED_OUTPATIENT_CLINIC_OR_DEPARTMENT_OTHER): Payer: Self-pay

## 2023-07-04 ENCOUNTER — Ambulatory Visit: Admitting: Family Medicine

## 2023-07-04 ENCOUNTER — Encounter: Payer: Self-pay | Admitting: Family Medicine

## 2023-07-04 VITALS — BP 110/78 | HR 98 | Ht 59.0 in | Wt 122.0 lb

## 2023-07-04 DIAGNOSIS — M9901 Segmental and somatic dysfunction of cervical region: Secondary | ICD-10-CM | POA: Diagnosis not present

## 2023-07-04 DIAGNOSIS — M9902 Segmental and somatic dysfunction of thoracic region: Secondary | ICD-10-CM

## 2023-07-04 DIAGNOSIS — M25511 Pain in right shoulder: Secondary | ICD-10-CM

## 2023-07-04 DIAGNOSIS — F418 Other specified anxiety disorders: Secondary | ICD-10-CM | POA: Diagnosis not present

## 2023-07-04 DIAGNOSIS — M9908 Segmental and somatic dysfunction of rib cage: Secondary | ICD-10-CM | POA: Diagnosis not present

## 2023-07-04 MED ORDER — HYDROXYZINE HCL 10 MG PO TABS
10.0000 mg | ORAL_TABLET | Freq: Three times a day (TID) | ORAL | 0 refills | Status: AC | PRN
  Filled 2023-07-04: qty 90, 30d supply, fill #0

## 2023-07-04 NOTE — Assessment & Plan Note (Signed)
 Having a lot of family issues at the moment.  Hydroxyzine  given for any type of breakthrough anxiety that seems to be situational.

## 2023-07-04 NOTE — Patient Instructions (Addendum)
 Trigger point injections today Good to see you! Prescription filled See you again in 4-6 weeks

## 2023-07-04 NOTE — Assessment & Plan Note (Signed)
 Has been a significant amount of time and since patient had some difficulty.  Discussed icing regimen of home exercises, activities to do and which ones to avoid.  Increase activity slowly.  Discussed icing regimen.  Follow-up again in 6 to 8 weeks

## 2023-07-10 DIAGNOSIS — F4323 Adjustment disorder with mixed anxiety and depressed mood: Secondary | ICD-10-CM | POA: Diagnosis not present

## 2023-07-18 DIAGNOSIS — F4323 Adjustment disorder with mixed anxiety and depressed mood: Secondary | ICD-10-CM | POA: Diagnosis not present

## 2023-07-25 DIAGNOSIS — F4323 Adjustment disorder with mixed anxiety and depressed mood: Secondary | ICD-10-CM | POA: Diagnosis not present

## 2023-07-27 ENCOUNTER — Telehealth: Payer: Self-pay

## 2023-07-27 NOTE — Telephone Encounter (Signed)
 Returned her call and left a message. 6/16 at 1030 port lab flush scheduled. Ask her to call the office for questions.

## 2023-07-31 ENCOUNTER — Inpatient Hospital Stay

## 2023-07-31 ENCOUNTER — Ambulatory Visit (HOSPITAL_COMMUNITY)
Admission: RE | Admit: 2023-07-31 | Discharge: 2023-07-31 | Disposition: A | Discharge status: Home / Self Care | Source: Ambulatory Visit | Attending: Gynecologic Oncology | Admitting: Gynecologic Oncology

## 2023-07-31 ENCOUNTER — Inpatient Hospital Stay: Attending: Gynecologic Oncology

## 2023-07-31 DIAGNOSIS — R911 Solitary pulmonary nodule: Secondary | ICD-10-CM | POA: Diagnosis not present

## 2023-07-31 DIAGNOSIS — F064 Anxiety disorder due to known physiological condition: Secondary | ICD-10-CM | POA: Diagnosis not present

## 2023-07-31 DIAGNOSIS — Z9221 Personal history of antineoplastic chemotherapy: Secondary | ICD-10-CM | POA: Insufficient documentation

## 2023-07-31 DIAGNOSIS — K7689 Other specified diseases of liver: Secondary | ICD-10-CM | POA: Diagnosis not present

## 2023-07-31 DIAGNOSIS — C569 Malignant neoplasm of unspecified ovary: Secondary | ICD-10-CM | POA: Insufficient documentation

## 2023-07-31 DIAGNOSIS — Z8543 Personal history of malignant neoplasm of ovary: Secondary | ICD-10-CM | POA: Insufficient documentation

## 2023-07-31 LAB — CBC WITH DIFFERENTIAL/PLATELET
Abs Immature Granulocytes: 0.06 10*3/uL (ref 0.00–0.07)
Basophils Absolute: 0.1 10*3/uL (ref 0.0–0.1)
Basophils Relative: 1 %
Eosinophils Absolute: 0 10*3/uL (ref 0.0–0.5)
Eosinophils Relative: 0 %
HCT: 41.3 % (ref 36.0–46.0)
Hemoglobin: 14.1 g/dL (ref 12.0–15.0)
Immature Granulocytes: 1 %
Lymphocytes Relative: 15 %
Lymphs Abs: 1.9 10*3/uL (ref 0.7–4.0)
MCH: 32.3 pg (ref 26.0–34.0)
MCHC: 34.1 g/dL (ref 30.0–36.0)
MCV: 94.5 fL (ref 80.0–100.0)
Monocytes Absolute: 1 10*3/uL (ref 0.1–1.0)
Monocytes Relative: 8 %
Neutro Abs: 9.7 10*3/uL — ABNORMAL HIGH (ref 1.7–7.7)
Neutrophils Relative %: 75 %
Platelets: 317 10*3/uL (ref 150–400)
RBC: 4.37 MIL/uL (ref 3.87–5.11)
RDW: 12.7 % (ref 11.5–15.5)
WBC: 12.7 10*3/uL — ABNORMAL HIGH (ref 4.0–10.5)
nRBC: 0 % (ref 0.0–0.2)

## 2023-07-31 LAB — COMPREHENSIVE METABOLIC PANEL WITH GFR
ALT: 22 U/L (ref 0–44)
AST: 15 U/L (ref 15–41)
Albumin: 4.6 g/dL (ref 3.5–5.0)
Alkaline Phosphatase: 62 U/L (ref 38–126)
Anion gap: 9 (ref 5–15)
BUN: 13 mg/dL (ref 6–20)
CO2: 27 mmol/L (ref 22–32)
Calcium: 9.4 mg/dL (ref 8.9–10.3)
Chloride: 103 mmol/L (ref 98–111)
Creatinine, Ser: 0.78 mg/dL (ref 0.44–1.00)
GFR, Estimated: >60 mL/min (ref >60)
Glucose, Bld: 106 mg/dL — ABNORMAL HIGH (ref 70–99)
Potassium: 4 mmol/L (ref 3.5–5.1)
Sodium: 139 mmol/L (ref 135–145)
Total Bilirubin: 0.6 mg/dL (ref 0.0–1.2)
Total Protein: 7.4 g/dL (ref 6.5–8.1)

## 2023-07-31 MED ORDER — IOHEXOL 300 MG/ML  SOLN
100.0000 mL | Freq: Once | INTRAMUSCULAR | Status: AC | PRN
  Administered 2023-07-31: 100 mL via INTRAVENOUS

## 2023-07-31 MED ORDER — IOHEXOL 9 MG/ML PO SOLN
ORAL | Status: AC
  Filled 2023-07-31: qty 1000

## 2023-07-31 MED ORDER — IOHEXOL 9 MG/ML PO SOLN
1000.0000 mL | Freq: Once | ORAL | Status: AC
  Administered 2023-07-31: 1000 mL via ORAL

## 2023-07-31 MED ORDER — SODIUM CHLORIDE (PF) 0.9 % IJ SOLN
INTRAMUSCULAR | Status: AC
  Filled 2023-07-31: qty 50

## 2023-07-31 MED ORDER — HEPARIN SOD (PORK) LOCK FLUSH 100 UNIT/ML IV SOLN
INTRAVENOUS | Status: AC
  Filled 2023-07-31: qty 5

## 2023-07-31 MED ORDER — HEPARIN SOD (PORK) LOCK FLUSH 100 UNIT/ML IV SOLN
500.0000 [IU] | Freq: Once | INTRAVENOUS | Status: AC
  Administered 2023-07-31: 500 [IU] via INTRAVENOUS

## 2023-07-31 MED ORDER — SODIUM CHLORIDE 0.9% FLUSH: 10.0000 mL | Freq: Once | INTRAVENOUS | Status: AC

## 2023-08-01 ENCOUNTER — Telehealth: Payer: Self-pay

## 2023-08-01 LAB — CA 125: Cancer Antigen (CA) 125: 12.8 U/mL (ref 0.0–38.1)

## 2023-08-01 NOTE — Telephone Encounter (Signed)
 Called and told her labs ran out for HCG and AFP. Offered another lab appt on 6/20, she declined and will just skip it this time.

## 2023-08-02 ENCOUNTER — Other Ambulatory Visit: Payer: Self-pay | Admitting: Gynecologic Oncology

## 2023-08-02 ENCOUNTER — Ambulatory Visit: Payer: Self-pay | Admitting: Gynecologic Oncology

## 2023-08-02 ENCOUNTER — Other Ambulatory Visit (HOSPITAL_BASED_OUTPATIENT_CLINIC_OR_DEPARTMENT_OTHER): Payer: Self-pay

## 2023-08-02 ENCOUNTER — Encounter: Payer: Self-pay | Admitting: Family Medicine

## 2023-08-02 DIAGNOSIS — C569 Malignant neoplasm of unspecified ovary: Secondary | ICD-10-CM

## 2023-08-02 MED ORDER — SERTRALINE HCL 25 MG PO TABS
25.0000 mg | ORAL_TABLET | Freq: Every day | ORAL | 2 refills | Status: DC
  Filled 2023-08-02: qty 30, 30d supply, fill #0
  Filled 2023-08-27: qty 30, 30d supply, fill #1
  Filled 2023-09-18 – 2023-09-26 (×2): qty 30, 30d supply, fill #2

## 2023-08-04 ENCOUNTER — Other Ambulatory Visit (HOSPITAL_BASED_OUTPATIENT_CLINIC_OR_DEPARTMENT_OTHER): Payer: Self-pay

## 2023-08-04 ENCOUNTER — Inpatient Hospital Stay

## 2023-08-04 ENCOUNTER — Encounter: Payer: Self-pay | Admitting: Hematology and Oncology

## 2023-08-04 ENCOUNTER — Inpatient Hospital Stay: Payer: Managed Care, Other (non HMO) | Admitting: Hematology and Oncology

## 2023-08-04 ENCOUNTER — Inpatient Hospital Stay: Payer: Managed Care, Other (non HMO)

## 2023-08-04 VITALS — BP 135/75 | HR 96 | Temp 98.1°F | Resp 18 | Ht 59.0 in | Wt 119.4 lb

## 2023-08-04 DIAGNOSIS — C569 Malignant neoplasm of unspecified ovary: Secondary | ICD-10-CM

## 2023-08-04 DIAGNOSIS — Z9221 Personal history of antineoplastic chemotherapy: Secondary | ICD-10-CM | POA: Diagnosis not present

## 2023-08-04 DIAGNOSIS — R4589 Other symptoms and signs involving emotional state: Secondary | ICD-10-CM | POA: Insufficient documentation

## 2023-08-04 DIAGNOSIS — F064 Anxiety disorder due to known physiological condition: Secondary | ICD-10-CM | POA: Diagnosis not present

## 2023-08-04 MED ORDER — LORAZEPAM 0.5 MG PO TABS
0.5000 mg | ORAL_TABLET | Freq: Three times a day (TID) | ORAL | 0 refills | Status: AC | PRN
  Filled 2023-08-04: qty 60, 20d supply, fill #0

## 2023-08-04 NOTE — Assessment & Plan Note (Addendum)
 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

## 2023-08-04 NOTE — Progress Notes (Signed)
 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 She received neoadjuvant chemotherapy for 3 cycles followed by extensive surgery in January 2024 and completion of further chemotherapy by March 2024 Final pathology: 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, 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, ER negative  The patient is undergoing active surveillance Recent CT imaging prompted further review and recommendation for MRI of the pelvis Unfortunately, this recent recommendation has triggered a significant anxiety/panic attack She is scheduled for MRI to be done and we will follow on test results Recent tumor markers are normal/reassuring Anxiety about health She had significant anxiety recently due to her situation The patient has been having difficulties with sleeping, appetite, crying spells and others She was prescribed Zoloft and she would like to continue taking Zoloft for now I refilled her prescription lorazepam  She has a therapist that she meets regularly to help her cope We discussed some general strategies such as physical activity that could be helpful in her situation  No orders of the defined types were placed in this encounter.    Almeda Jacobs, MD  INTERVAL HISTORY: she returns for surveillance follow-up She is here with her husband Since she was last seen here, she was told she needed additional imaging That trigger significant panic/anxiety attack She was started on Zoloft which she started taking yesterday The patient has been crying, not eating  and not sleeping She denies abdominal pain no changes in bowel habits  PHYSICAL EXAMINATION: ECOG PERFORMANCE STATUS: 1 - Symptomatic but completely ambulatory  Vitals:   08/04/23 0903  BP: 135/75  Pulse: 96  Resp: 18  Temp: 98.1 F (36.7 C)  SpO2: 98%   Filed Weights   08/04/23 0903  Weight: 119 lb 6.4 oz (54.2 kg)    Relevant data reviewed during this visit included CBC, CMP, CA125, CT imaging from June 2025

## 2023-08-04 NOTE — Assessment & Plan Note (Addendum)
 She had significant anxiety recently due to her situation The patient has been having difficulties with sleeping, appetite, crying spells and others She was prescribed Zoloft and she would like to continue taking Zoloft for now I refilled her prescription lorazepam  She has a therapist that she meets regularly to help her cope We discussed some general strategies such as physical activity that could be helpful in her situation

## 2023-08-05 ENCOUNTER — Ambulatory Visit: Payer: Self-pay | Admitting: Gynecologic Oncology

## 2023-08-05 LAB — AFP TUMOR MARKER: AFP, Serum, Tumor Marker: 3.6 ng/mL (ref 0.0–9.2)

## 2023-08-05 LAB — BETA HCG QUANT (REF LAB): hCG Quant: 2 m[IU]/mL

## 2023-08-08 ENCOUNTER — Ambulatory Visit (HOSPITAL_COMMUNITY)
Admission: RE | Admit: 2023-08-08 | Discharge: 2023-08-08 | Disposition: A | Discharge status: Home / Self Care | Source: Ambulatory Visit | Attending: Gynecologic Oncology | Admitting: Gynecologic Oncology

## 2023-08-08 DIAGNOSIS — C569 Malignant neoplasm of unspecified ovary: Secondary | ICD-10-CM | POA: Diagnosis not present

## 2023-08-08 DIAGNOSIS — N898 Other specified noninflammatory disorders of vagina: Secondary | ICD-10-CM | POA: Diagnosis not present

## 2023-08-08 MED ORDER — GADOBUTROL 1 MMOL/ML IV SOLN
5.0000 mL | Freq: Once | INTRAVENOUS | Status: AC | PRN
  Administered 2023-08-08: 5 mL via INTRAVENOUS

## 2023-08-09 ENCOUNTER — Encounter: Payer: Self-pay | Admitting: Oncology

## 2023-08-09 DIAGNOSIS — C569 Malignant neoplasm of unspecified ovary: Secondary | ICD-10-CM

## 2023-08-09 DIAGNOSIS — F4323 Adjustment disorder with mixed anxiety and depressed mood: Secondary | ICD-10-CM | POA: Diagnosis not present

## 2023-08-09 NOTE — Progress Notes (Signed)
 Order placed for PET scan per Dr. Viktoria.  Called Enora and let her know the PET scan is scheduled for 08/14/2023 at [facility] with arrival at 1:30.  Also have her instructions for NPO except for water 6 hours prior to the scan. She verbalized understanding and agreement.

## 2023-08-10 NOTE — Progress Notes (Signed)
 [name] [address] [place] [address] Phone: [phone] Subjective:   [name], am serving as a scribe for [name].  I'm seeing this patient by the request  of:  [name]  CC: Back and neck pain  [name]  [name] is a 53 y.o. female coming in with complaint of back and neck pain. OMT 07/04/2023. Patient states no change since last visit.   Medications patient has been prescribed: Zoloft , Hydroxizine  Taking:    Patient did have imaging June 24 with an MRI showing that there is a potential for increasing complex cystic lesion 3.7 cm unfortunately this does seem to be new from patient's previous CT of the abdomen in August and December.  Patient is scheduled for a PET scan today at 2:00.     Reviewed prior external information including notes and imaging from previsou exam, outside providers and external EMR if available.   As well as notes that were available from care everywhere and other healthcare systems.  Past medical history, social, surgical and family history all reviewed in electronic medical record.  No pertanent information unless stated regarding to the chief complaint.   Past Medical History:  Diagnosis Date   Arthritis    Endometriosis    Family history of breast cancer    Family history of breast cancer in mother    at age 54   GAD (generalized anxiety disorder)    lexapro helped 2017 (Dr. Adel did have extra stress of the Master's degree program on her at that time.  Got counseling.       GERD (gastroesophageal reflux disease)    Gestational diabetes    History of kidney stones    Low back pain    Dr. Zach Krystyl Palmer.   Neck pain    and left shoulder pain: managed by osteopathic manipulation by Dr. Darlyn Palmer. ?cervical radiculopathy   ovarian ca 12/01/2021   PAC (premature atrial contraction)    PONV (postoperative nausea and vomiting)     Allergies  Allergen  Reactions   Latex Other (See Comments)    Sensitivity only   Sulfa Antibiotics Hives and Other (See Comments)   Sulfamethoxazole-Trimethoprim Rash   Septra [Bactrim] Rash     Review of Systems:  No headache, visual changes, nausea, vomiting, diarrhea, constipation, dizziness, abdominal pain, skin rash, fevers, chills, night sweats, weight loss, swollen lymph nodes, body aches, joint swelling, chest pain, shortness of breath, mood changes. POSITIVE muscle aches  Objective  Blood pressure 120/76, pulse (!) 111, height 4' 11 (1.499 m), SpO2 97%.   General: very anxious overall  HEENT: Pupils equal, extraocular movements intact  Respiratory: Patient's speak in full sentences and does not appear short of breath  Cardiovascular: No lower extremity edema, non tender, no erythema  Gait MSK:  Back does have loss of lordosis patient does have tightness more in the thoracolumbar juncture right greater than left.  Neck exam does have some limited range of motion noted.  No significant masses are palpated in the abdominal region or in the neck.  Patient notes again respiratory anxious.  Osteopathic findings  C2 flexed rotated and side bent right C6 flexed rotated and side bent right T3 extended rotated and side bent right inhaled rib T9 extended rotated and side bent right  L2 flexed rotated and side bent right Sacrum right on right       Assessment and Plan:  Trigger point  of right shoulder region Patient does have significant tightness noted.  Responded extremely well to osteopathic manipulation.  We discussed with patient about icing regimen, home exercises.  Significantly concerned with patient having the mucous cyst noted.  Concern for reoccurrence.  Awaiting PET scan.  Depending on the findings patient will be following up with her oncologist and primary care.  Discussed with her that other medications other than the Zoloft  may be better for the anxiety component.  Increase activity  slowly.  Follow-up again in 6 to 8 weeks.  Malignant germ cell tumor of ovary [facility]) Recent finding noted on CT scan and is awaiting PET scan later today.  No fevers no chills, patient does have a increase in white blood cells with a left shift but no dysuria or hematuria noted.    Nonallopathic problems  Decision today to treat with OMT was based on Physical Exam  After verbal consent patient was treated with HVLA, ME, FPR techniques in cervical, rib, thoracic, lumbar, and sacral  areas  Patient tolerated the procedure well with improvement in symptoms  Patient given exercises, stretches and lifestyle modifications  See medications in patient instructions if given  Patient will follow up in 4-8 weeks    The above documentation has been reviewed and is accurate and complete Linda Beougher M Sherlene Rickel, DO          Note: This dictation was prepared with Dragon dictation along with smaller phrase technology. Any transcriptional errors that result from this process are unintentional.

## 2023-08-14 ENCOUNTER — Encounter
Admission: RE | Admit: 2023-08-14 | Discharge: 2023-08-14 | Disposition: A | Discharge status: Home / Self Care | Source: Ambulatory Visit | Attending: Gynecologic Oncology | Admitting: Gynecologic Oncology

## 2023-08-14 ENCOUNTER — Ambulatory Visit: Admitting: Family Medicine

## 2023-08-14 ENCOUNTER — Encounter: Payer: Self-pay | Admitting: Family Medicine

## 2023-08-14 VITALS — BP 120/76 | HR 111 | Ht 59.0 in

## 2023-08-14 DIAGNOSIS — M9902 Segmental and somatic dysfunction of thoracic region: Secondary | ICD-10-CM | POA: Diagnosis not present

## 2023-08-14 DIAGNOSIS — M9901 Segmental and somatic dysfunction of cervical region: Secondary | ICD-10-CM | POA: Diagnosis not present

## 2023-08-14 DIAGNOSIS — M9904 Segmental and somatic dysfunction of sacral region: Secondary | ICD-10-CM

## 2023-08-14 DIAGNOSIS — C569 Malignant neoplasm of unspecified ovary: Secondary | ICD-10-CM

## 2023-08-14 DIAGNOSIS — N3289 Other specified disorders of bladder: Secondary | ICD-10-CM | POA: Diagnosis not present

## 2023-08-14 DIAGNOSIS — M9903 Segmental and somatic dysfunction of lumbar region: Secondary | ICD-10-CM | POA: Diagnosis not present

## 2023-08-14 DIAGNOSIS — M25511 Pain in right shoulder: Secondary | ICD-10-CM

## 2023-08-14 DIAGNOSIS — M9908 Segmental and somatic dysfunction of rib cage: Secondary | ICD-10-CM

## 2023-08-14 DIAGNOSIS — K5730 Diverticulosis of large intestine without perforation or abscess without bleeding: Secondary | ICD-10-CM | POA: Diagnosis not present

## 2023-08-14 DIAGNOSIS — N200 Calculus of kidney: Secondary | ICD-10-CM | POA: Diagnosis not present

## 2023-08-14 LAB — GLUCOSE, CAPILLARY: Glucose-Capillary: 118 mg/dL — ABNORMAL HIGH (ref 70–99)

## 2023-08-14 MED ORDER — FLUDEOXYGLUCOSE F - 18 (FDG) INJECTION
6.2000 | Freq: Once | INTRAVENOUS | Status: AC | PRN
  Administered 2023-08-14: 6.62 via INTRAVENOUS

## 2023-08-14 NOTE — Assessment & Plan Note (Signed)
 Recent finding noted on CT scan and is awaiting PET scan later today.  No fevers no chills, patient does have a increase in white blood cells with a left shift but no dysuria or hematuria noted.

## 2023-08-14 NOTE — Assessment & Plan Note (Signed)
 Patient does have significant tightness noted.  Responded extremely well to osteopathic manipulation.  We discussed with patient about icing regimen, home exercises.  Significantly concerned with patient having the mucous cyst noted.  Concern for reoccurrence.  Awaiting PET scan.  Depending on the findings patient will be following up with her oncologist and primary care.  Discussed with her that other medications other than the Zoloft  may be better for the anxiety component.  Increase activity slowly.  Follow-up again in 6 to 8 weeks.

## 2023-08-14 NOTE — Patient Instructions (Signed)
 Stay active when you can Think about Cymbalta instead of Zoloft  See me again in 6-8 weeks

## 2023-08-15 NOTE — Progress Notes (Signed)
 Called the patient to discuss Pet results. She knows that I will be able to look at the images myself tomorrow when I'm in [place]. The area near the vaginal cuff is not hyper metabolic. Discussed based on my review, we will either see if biopsy is feasible or, more likely, plan on close interval imaging.  Comer Dollar MD

## 2023-08-16 ENCOUNTER — Telehealth: Payer: Self-pay | Admitting: Gynecologic Oncology

## 2023-08-16 NOTE — Telephone Encounter (Signed)
 Called patient again to discuss PET results. After reviewing images with one of our interventional radiologists (both PET and MRI), lesion felt to be mostly cystic with benign characteristics overall. Differential includes peritoneal incision cyst and lymphocele. Given asymptomatic and normal tumor markers, with stable to minimal change in size of this area since 09/2022, we discussed option of transgluteal aspiration/biopsy now versus close follow-up with imaging (and pursuing aspiration if change in size or appearance of this area). Her preference was for the later. I will see her in August and we will get repeat imaging in September.  Comer Dollar MD Gynecologic Oncology

## 2023-08-28 DIAGNOSIS — F4323 Adjustment disorder with mixed anxiety and depressed mood: Secondary | ICD-10-CM | POA: Diagnosis not present

## 2023-09-05 NOTE — Addendum Note (Signed)
 Encounter addended by: Gladis Burnard SAILOR on: 09/05/2023 5:02 PM  Actions taken: Imaging Exam begun, Charge Capture section accepted

## 2023-09-18 ENCOUNTER — Other Ambulatory Visit (HOSPITAL_BASED_OUTPATIENT_CLINIC_OR_DEPARTMENT_OTHER): Payer: Self-pay

## 2023-09-18 ENCOUNTER — Other Ambulatory Visit: Payer: Self-pay

## 2023-09-19 ENCOUNTER — Other Ambulatory Visit: Payer: Self-pay | Admitting: Hematology and Oncology

## 2023-09-19 DIAGNOSIS — C569 Malignant neoplasm of unspecified ovary: Secondary | ICD-10-CM

## 2023-09-20 DIAGNOSIS — F4323 Adjustment disorder with mixed anxiety and depressed mood: Secondary | ICD-10-CM | POA: Diagnosis not present

## 2023-09-22 ENCOUNTER — Telehealth: Payer: Self-pay

## 2023-09-22 NOTE — Telephone Encounter (Signed)
 Patient called to request changing her upcoming port flush appointment on 8/12 to the following week. Patient rescheduled for 8/18 at 8:45 AM. New appointment date and time confirmed with patient.

## 2023-09-26 ENCOUNTER — Inpatient Hospital Stay

## 2023-10-02 ENCOUNTER — Inpatient Hospital Stay: Attending: Gynecologic Oncology

## 2023-10-02 DIAGNOSIS — Z90710 Acquired absence of both cervix and uterus: Secondary | ICD-10-CM | POA: Insufficient documentation

## 2023-10-02 DIAGNOSIS — Z90722 Acquired absence of ovaries, bilateral: Secondary | ICD-10-CM | POA: Insufficient documentation

## 2023-10-02 DIAGNOSIS — Z9079 Acquired absence of other genital organ(s): Secondary | ICD-10-CM | POA: Insufficient documentation

## 2023-10-02 DIAGNOSIS — Z9221 Personal history of antineoplastic chemotherapy: Secondary | ICD-10-CM | POA: Diagnosis not present

## 2023-10-02 DIAGNOSIS — N951 Menopausal and female climacteric states: Secondary | ICD-10-CM | POA: Diagnosis not present

## 2023-10-02 DIAGNOSIS — F419 Anxiety disorder, unspecified: Secondary | ICD-10-CM | POA: Insufficient documentation

## 2023-10-02 DIAGNOSIS — C569 Malignant neoplasm of unspecified ovary: Secondary | ICD-10-CM

## 2023-10-02 DIAGNOSIS — Z8543 Personal history of malignant neoplasm of ovary: Secondary | ICD-10-CM | POA: Diagnosis not present

## 2023-10-02 LAB — COMPREHENSIVE METABOLIC PANEL WITH GFR
ALT: 26 U/L (ref 0–44)
AST: 20 U/L (ref 15–41)
Albumin: 4.6 g/dL (ref 3.5–5.0)
Alkaline Phosphatase: 56 U/L (ref 38–126)
Anion gap: 9 (ref 5–15)
BUN: 16 mg/dL (ref 6–20)
CO2: 26 mmol/L (ref 22–32)
Calcium: 9.1 mg/dL (ref 8.9–10.3)
Chloride: 104 mmol/L (ref 98–111)
Creatinine, Ser: 0.71 mg/dL (ref 0.44–1.00)
GFR, Estimated: >60 mL/min (ref >60)
Glucose, Bld: 118 mg/dL — ABNORMAL HIGH (ref 70–99)
Potassium: 3.6 mmol/L (ref 3.5–5.1)
Sodium: 139 mmol/L (ref 135–145)
Total Bilirubin: 0.7 mg/dL (ref 0.0–1.2)
Total Protein: 7.1 g/dL (ref 6.5–8.1)

## 2023-10-02 LAB — CBC WITH DIFFERENTIAL/PLATELET
Abs Immature Granulocytes: 0.06 K/uL (ref 0.00–0.07)
Basophils Absolute: 0.1 K/uL (ref 0.0–0.1)
Basophils Relative: 1 %
Eosinophils Absolute: 0.2 K/uL (ref 0.0–0.5)
Eosinophils Relative: 2 %
HCT: 40.2 % (ref 36.0–46.0)
Hemoglobin: 13.5 g/dL (ref 12.0–15.0)
Immature Granulocytes: 1 %
Lymphocytes Relative: 25 %
Lymphs Abs: 2.5 K/uL (ref 0.7–4.0)
MCH: 32.2 pg (ref 26.0–34.0)
MCHC: 33.6 g/dL (ref 30.0–36.0)
MCV: 95.9 fL (ref 80.0–100.0)
Monocytes Absolute: 1.1 K/uL — ABNORMAL HIGH (ref 0.1–1.0)
Monocytes Relative: 10 %
Neutro Abs: 6.4 K/uL (ref 1.7–7.7)
Neutrophils Relative %: 61 %
Platelets: 349 K/uL (ref 150–400)
RBC: 4.19 MIL/uL (ref 3.87–5.11)
RDW: 13.1 % (ref 11.5–15.5)
WBC: 10.3 K/uL (ref 4.0–10.5)
nRBC: 0 % (ref 0.0–0.2)

## 2023-10-02 MED ORDER — SODIUM CHLORIDE 0.9% FLUSH
10.0000 mL | Freq: Once | INTRAVENOUS | Status: AC
  Administered 2023-10-02: 10 mL

## 2023-10-02 NOTE — Progress Notes (Signed)
 Patient her for port flush.  She wanted to have all labs done, except tumor marker.  Informed her all the labs or standing order and that she's scheduled for a flush only.  Secure chatted Dr. Lonn.  States to do all standing order labs, including the CA 125 because patient is scheduled to see Dr. Viktoria in two weeks.  Patient was informed

## 2023-10-03 DIAGNOSIS — F4323 Adjustment disorder with mixed anxiety and depressed mood: Secondary | ICD-10-CM | POA: Diagnosis not present

## 2023-10-03 LAB — CA 125: Cancer Antigen (CA) 125: 12.7 U/mL (ref 0.0–38.1)

## 2023-10-03 LAB — AFP TUMOR MARKER: AFP, Serum, Tumor Marker: 3.5 ng/mL (ref 0.0–9.2)

## 2023-10-03 LAB — BETA HCG QUANT (REF LAB): hCG Quant: 2 m[IU]/mL

## 2023-10-04 NOTE — Addendum Note (Signed)
 Encounter addended by: Gladis Burnard SAILOR on: 10/04/2023 3:43 PM  Actions taken: Imaging Exam begun

## 2023-10-11 ENCOUNTER — Other Ambulatory Visit: Payer: Self-pay | Admitting: Gynecologic Oncology

## 2023-10-11 ENCOUNTER — Other Ambulatory Visit: Payer: Self-pay | Admitting: Family Medicine

## 2023-10-11 ENCOUNTER — Other Ambulatory Visit: Payer: Self-pay

## 2023-10-11 ENCOUNTER — Other Ambulatory Visit (HOSPITAL_BASED_OUTPATIENT_CLINIC_OR_DEPARTMENT_OTHER): Payer: Self-pay

## 2023-10-11 DIAGNOSIS — E8940 Asymptomatic postprocedural ovarian failure: Secondary | ICD-10-CM

## 2023-10-11 MED ORDER — ESTRADIOL 0.05 MG/24HR TD PTTW
1.0000 | MEDICATED_PATCH | TRANSDERMAL | 12 refills | Status: AC
  Filled 2023-10-11: qty 8, 28d supply, fill #0
  Filled 2023-11-13: qty 8, 28d supply, fill #1
  Filled 2023-12-06: qty 8, 28d supply, fill #2
  Filled 2024-01-09: qty 8, 28d supply, fill #3
  Filled 2024-02-05: qty 8, 28d supply, fill #4
  Filled 2024-03-01: qty 8, 28d supply, fill #5

## 2023-10-11 MED ORDER — SERTRALINE HCL 25 MG PO TABS
25.0000 mg | ORAL_TABLET | Freq: Every day | ORAL | 2 refills | Status: DC
  Filled 2023-10-11 – 2023-10-25 (×2): qty 30, 30d supply, fill #0
  Filled 2023-11-23: qty 30, 30d supply, fill #1
  Filled 2023-12-21: qty 30, 30d supply, fill #2

## 2023-10-13 ENCOUNTER — Inpatient Hospital Stay: Admitting: Gynecologic Oncology

## 2023-10-13 ENCOUNTER — Encounter: Payer: Self-pay | Admitting: Gynecologic Oncology

## 2023-10-13 VITALS — BP 110/72 | HR 100 | Temp 98.5°F | Resp 19 | Wt 121.0 lb

## 2023-10-13 DIAGNOSIS — F419 Anxiety disorder, unspecified: Secondary | ICD-10-CM | POA: Diagnosis not present

## 2023-10-13 DIAGNOSIS — N951 Menopausal and female climacteric states: Secondary | ICD-10-CM

## 2023-10-13 DIAGNOSIS — C569 Malignant neoplasm of unspecified ovary: Secondary | ICD-10-CM

## 2023-10-13 DIAGNOSIS — Z8543 Personal history of malignant neoplasm of ovary: Secondary | ICD-10-CM

## 2023-10-13 DIAGNOSIS — Z9079 Acquired absence of other genital organ(s): Secondary | ICD-10-CM | POA: Diagnosis not present

## 2023-10-13 DIAGNOSIS — Z90722 Acquired absence of ovaries, bilateral: Secondary | ICD-10-CM | POA: Diagnosis not present

## 2023-10-13 DIAGNOSIS — Z9221 Personal history of antineoplastic chemotherapy: Secondary | ICD-10-CM | POA: Diagnosis not present

## 2023-10-13 DIAGNOSIS — Z90710 Acquired absence of both cervix and uterus: Secondary | ICD-10-CM | POA: Diagnosis not present

## 2023-10-13 NOTE — Progress Notes (Signed)
 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 21.1 by 10.9 by 18.7 cm abdominopelvic mass, with a satellite mass in the right upper quadrant anteriorly, and moderate ascites with tumor deposits along the pelvic ascites posteriorly. The mass could be arising from right or left ovary, or both. The mass displaces the bowel and IVC, but there is no IVC thrombus or pelvic DVT identified. 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 2. Additional satellite mass above the liver is partially included, along with tumor deposits along the ascites. 3. Low-level edema along the lateral abdominal wall musculature. 4. Flattening of the IVC at the level of the aortic bifurcation due to the large abdominopelvic tumor. No findings of pelvic DVT.     12/07/2021 Imaging   CT abdomen and pelvis 1. Large central abdominal and pelvic mass consistent with known malignancy. This is incompletely visualized by this CT of the abdomen, but was seen on recent pelvic MRI. 2. 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 Right omental implant consistent with metastatic disease. Small amount of abdominal ascites. 3. No evidence for bowel or ureteral obstruction. 4. Aortic Atherosclerosis (ICD10-I70.0). 5. Chest findings dictated separately.   12/07/2021 Imaging   CT chest 1. Negative for pulmonary embolism. And no acute or metastatic process identified in the Chest. 2. Partially visible liver mass and ascites, staging CT Abdomen today is reported separately.     12/07/2021 Procedure    Successful placement of a right internal jugular approach power injectable Port-A-Cath. The catheter is ready for immediate use.   12/07/2021 Procedure   Ultrasound-guided biopsy of right upper quadrant mass.    12/08/2021 Initial Diagnosis   Ovarian cancer (HCC)   12/08/2021 Cancer Staging   Staging form: Ovary, Fallopian Tube, and Primary Peritoneal Carcinoma, AJCC 8th Edition - Clinical stage from 12/08/2021: FIGO Stage IIIC (cT3c, cN0, cM0) - Signed by Lonn Hicks, MD on 12/10/2021 Stage prefix: Initial diagnosis   12/20/2021 - 12/28/2021 Chemotherapy   Patient is on Treatment Plan : Ovarian germ cell tumor q21d x 3 Cycles     01/03/2022 Imaging   IMPRESSION: 1. Large central abdominopelvic mass compatible with malignancy, 3450 cubic cm in volume. This may be arising from the ovaries with slight eccentricity to the right in the pelvis. 2. Mild increase in size of the right omental tumor deposit. 3. Mild increase in size of the subcapsular mass along the dome of the right hepatic lobe, with suspected hepatic invasion. 4. Scattered malignant ascites. 5. No dilated bowel to suggest obstruction. 6. 2 mm left kidney lower pole nonobstructive renal calculus. 7. Mild abdominal aortic atherosclerotic vascular disease.   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  Postoperative Diagnosis: Status post R0 cytoreductive surgery and heating intraperitoneal chemotherapy  Procedures: LAPAROSCOPY, ABDOMEN, PERITONEUM, & OMENTUM, DIAGNOSTIC, W/WO COLLECTION SPECIMEN(S) BY BRUSHING OR WASHING  EXCISION/DESTRUCTION, OPEN, INTRA-ABD TUMOR/CYST/ENDOMETRIOMA, 1+ PERITONEAL/MESENTERI/RETROPERIT 5CM OR <COLECTOMY, PARTIAL; WITH ANASTOMOSIS COLOSTOMY OR SKIN LEVEL CECOSTOMY SPLENECTOMY; TOT (SEPART PROC) EXPLORATORY LAPAROTOMY, EXPLORATORY CELIOTOMY WITH OR WITHOUT  BIOPSY(S) HYPERTHERMIA, EXTERNALLY GENERATED;  DEEP CHEMOTHERAPY ADMINISTRATION INTO THE PERITONEAL CAVITY VIA INDWELLING PORT OR CATHETER RESECTION (INITIAL) OVARIAN, TUBAL/PRIM PERITONEAL MALIG W/BIL S&O/OMENTECT; W/RAD DISSECTION FOR DEBULKING CHOLECYSTECTOMY APPENDECTOMY OMENTECTOMY, EPIPLOECTOMY, RESECTION OF OMENTUM Procedures: Exploratory laparotomy, total abdominal hysterectomy with radical dissection, bilateral ureterolysis, bilateral salpingo-oophorectomy, posterior cul-de-sac peritonectomy, tumor debulking by Dr Viktoria with Gynecology Oncology Dr Wess with surgical oncology: Diagnostic laparoscopy, exploratory laparotomy, low anterior resection with primary end-to-end anastomosis, enterolysis, cholecystectomy, tumor capsule tumor debulking, splenectomy, appendectomy, omentectomy, diverting loop ileostomy formation  Surgeon: Comer Viktoria, MD  Findings: On diagnostic laparoscopy, only able to clearly visualize the left upper quadrant. Adhesions of bowel in the right upper quadrant limiting survey, minimal ascites noted in the pelvis, scattered tumor plaques visualized on left diaphragm. Given these findings, the decision was made to convert to exploratory laparotomy. Multiple loops of small bowel encased in a Cancn like structure in the midline with multiple filmy adhesions tethering to the superior aspect of the a large pelvic mass. Multicystic and necrotic pelvic mass originating from left ovary, approximately 18 x 20 cm incorporated within the left broad ligament with the left tube draped overlying the left ovarian mass. Bilateral tubes normal in appearance, normal-appearing uterus with smooth serosa. Right adnexa similarly with approximate 10 x 8 multicystic and necrotic mass. Diffuse tumor plaques encompassing posterior cul-de-sac peritoneum. Rectosigmoid densely adhered to posterior aspect of left adnexal mass requiring dissection and ultimately resection. Scattered treated subcentimeter nodules along small bowel mesentery. 5 x  5 cm necrotic and multicystic tumor at the level of the hepatic flexure. Enlarged similar multicystic necrotic tumor adherent between the right diaphragm and the liver without any evidence of liver invasion and tumor encased solely within the liver capsule. Multiple tumor nodules along the spleen requiring splenectomy. Omentum overall fairly normal in appearance with only scattered small tumor deposits, no omental cake was encountered. Please see surgical oncology operative note for further details regarding operative findings for their portion of the procedure.  Specimens: ID Type Source Tests Collected by Time Destination 1 : Abdominal fat pad Tissue Abdomen SURGICAL PATHOLOGY EXAM Wess Almarie Ghazi, MD 02/15/2022 [phone] 2 : Left adnexa Tissue Abdomen SURGICAL PATHOLOGY EXAM Wess Almarie Ghazi, MD 02/15/2022 [phone] : right adnexa Tissue Abdomen SURGICAL PATHOLOGY EXAM Wess Almarie Ghazi, MD 02/15/2022 [phone] 4 : Left perimetrium Tissue Uterus SURGICAL PATHOLOGY EXAM Viktoria Comer SAUNDERS, MD 02/15/2022 [phone] : Uterus and cervix Tissue Uterus SURGICAL PATHOLOGY EXAM Wess Almarie Ghazi, MD 02/15/2022 1018 6 : Low Anterior Resection Tissue Abdomen SURGICAL PATHOLOGY EXAM Wess Almarie Ghazi, MD 02/15/2022 1055 7 : Right peritoneum Tissue Abdomen SURGICAL PATHOLOGY EXAM Wess Almarie Ghazi, MD 02/15/2022 1058 8 : Right retroperitoneal mass Tissue Abdomen SURGICAL PATHOLOGY EXAM Wess Almarie Ghazi, MD 02/15/2022 1101 9 : Omentum Tissue Omentum SURGICAL PATHOLOGY EXAM Wess Almarie Ghazi, MD 02/15/2022 1130 10 : Gallbladder Tissue Gallbladder SURGICAL PATHOLOGY EXAM Wess Almarie Ghazi, MD 02/15/2022 1143 11 : Right diaphragm Tissue Diaphragm SURGICAL PATHOLOGY EXAM Wess Almarie Ghazi, MD 02/15/2022 1143 12 : Left diaphragm nodule Tissue Diaphragm SURGICAL PATHOLOGY EXAM Wess Almarie Ghazi, MD 02/15/2022 1157 13 : Spleen Tissue Spleen SURGICAL PATHOLOGY EXAM Wess Almarie Ghazi, MD 02/15/2022 1204 14 : Appendix Tissue Appendix SURGICAL PATHOLOGY EXAM Wess Almarie Ghazi, MD 02/15/2022 1213 15 : Small bowel mesenteric nodules Tissue Small Bowel SURGICAL PATHOLOGY EXAM Wess Almarie Ghazi, MD 02/15/2022 1215 16 : Colon mesenteric nodule Tissue Colon SURGICAL PATHOLOGY EXAM Wess Almarie Ghazi, MD 02/15/2022 1221 17 : Hepatic flexure  mass Tissue Liver SURGICAL PATHOLOGY EXAM Wess Almarie Ghazi, MD 02/15/2022 1227  Complications: None  Indications for Procedure: [name] is a 53 y.o. 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 Prior to the procedure, all risks, benefits, and alternatives were discussed and informed surgical consent was signed.  Procedure: Patient was taken to the operating room where general anesthesia was achieved. She was positioned in dorsal lithotomy and prepped and draped per the surgical oncology team. A foley catheter was inserted into the bladder. A 5 mm skin incision was made in the left upper quadrant at Palmer's point, the abdomen was then entered with direct Optiview entry and the abdomen was insufflated. Findings were noted as above.  The laparoscope was then removed from the abdomen, the pneumoperitoneum was maintained. A vertical midline incision was made with the scalpel and the abdomen was entered sharply. The abdomen and pelvis were surveyed with findings as documented above. A wound protector was placed followed by the [facility] retractor.  The enlarged left adnexal mass was then carefully removed of its bilateral sidewall attachments using a mixture of blunt and cautery dissection. Multiple small bowel loops were adhered to the superior and posterior aspect of the mass with filmy adhesions which were carefully bluntly resected. The rectosigmoid was noted to be densely adhered to the posterior aspect of the pelvic mass.  Dissection was attempted using mixture of blunt dissection as well as sharp dissection with the Metzenbaum scissors, and unavoidable enterotomy of approximately 4 cm was made to the anterior surface of the rectosigmoid. This enterotomy was then closed with 4-0 PDS in a running fashion to minimize contamination. Further dissection removing the remainder of the rectosigmoid from the posterior aspect of the pelvic mass was then undertaken. The pelvic mass was then able to be fully elevated from the pelvis. The left infundibulopelvic ligament was able to be isolated with the left ureter clearly visualized below. The left ureter was further dissected away from the medial leaf of the broad ligament, and a vessel loop was placed. The left infundibulopelvic ligament was then isolated, clamped, and transected using the LigaSure device as well as and suture-ligated. The left utero-ovarian ligament was then isolated, clamped, cauterized and transected. The left pelvic mass was then removed entirely.  Attention was then turned to the right. An additional enlarged multicystic right adnexal mass was noted, this was carefully elevated from the pelvis using blunt dissection. The right round ligament was then grasped, elevated, and transected. The broad ligament was then opened posteriorly. The ureter was identified and the right infundibulopelvic ligament was then isolated, clamped, transected, and tied. The right utero ovarian ligament was then isolated, clamped, and transected using the LigaSure device and the right adnexal mass was removed.  Attention then was turned toward the hysterectomy. A sponge stick was placed within the vagina. The right broad ligament dissection was opened anteriorly and the bladder flap was developed. The left round ligament was then further transected, and the broad ligament opened posteriorly and anteriorly to complete the bladder flap. The right uterine artery was skeletonized, clamped,  transected, and suture-ligated. There was significant parametrial tumor involvement on the left side, and therefore a radical dissection was required to isolate the blood supply. The left ureter was further dissected down to the level near the trigone. The left uterine artery was traced to its origin, isolated, and clamped with vascular clips. Similarly the left  uterine vein due to significant necrotic tumor involvement was friable. Detachment was similarly carefully isolated, and clipped with vascular clips. Sequential clamps were then used to transect the remainder of the broad and cardinal ligaments bilaterally, with each pedicle being suture-ligated. A large right angle clamp was then able to be placed below the cervix, and the uterus and cervix were amputated from the vagina. The vaginal cuff was then closed with several figure-of-eight sutures using 0 Vicryl.    02/15/2022 Pathology Results   A: Abdominal fat pad, excision - Fibroadipose tissue with residual mixed malignant germ cell tumor with treatment effect   B: Left adnexa, salpingo-oophorectomy - Residual mixed malignant germ cell tumor with treatment effect, [id number] (See Comment and Synoptic report) - FIGO stage IIIC - Fallopian tubes with no tumor present   C: Right adnexa, salpingo-oophorectomy - Residual mixed malignant germ cell tumor with treatment effect (See Synoptic report) - Fallopian tubes not identified   D: Left parametrium, biopsy - Fibroadipose tissue with residual mixed malignant germ cell tumor with treatment effect   E: Uterus and cervix, hysterectomy - Right adnexal soft tissue with residual mixed malignant germ cell tumor with treatment effect   Myometrium: - Adenomyosis - Leiomyomata measuring up to 0.7 cm - Extensive chronic serositis   Cervix:  - Ectocervix and endocervix with chronic cervicitis    Endometrium: - Inactive endometrium   F: Low anterior resection - Residual mixed malignant  germ cell  tumor with treatment effect involving colonic serosa and pericolonic adipose tissue - Proximal and distal colonic resection margins with chronic serositis, but negative for tumor    G: Right peritoneum, biopsy - Fibroadipose tissue with residual mixed malignant germ cell tumor with treatment effect   H: Right retroperitoneal mass, excision - Fibroadipose tissue with residual mixed malignant germ cell tumor with treatment effect   I: Omentum, omentectomy - Fibroadipose tissue with residual mixed malignant germ cell tumor with treatment effect   J: Gallbladder, cholecystectomy - Gallbladder with mild chronic serositis and fibrous adhesion   K: Right diaphragm, excision - Fibrous tissue with residual mixed malignant germ cell tumor with treatment effect - Hepatic tissue with no tumor present   L: Left diaphragm nodule, biopsy - Fibrous tissue with residual mixed malignant germ cell tumor with treatment effect   M: Spleen, splenectomy - Residual mixed malignant germ cell tumor with treatment effect involving splenic capsule   N: Appendix, appendectomy - Appendix with fibrous obliteration at the tip - Periappendiceal adipose tissue with acute and chronic inflammation   O: Small bowel mesenteric nodules, excision - Fibroadipose tissue with residual mixed malignant germ cell tumor with treatment effect   P: Mesenteric nodules, excision - Fibroadipose tissue with residual mixed malignant germ cell tumor with treatment effect   Q: Hepatic flexure mass, excision - Fibroadipose tissue with residual mixed malignant germ cell tumor with treatment effect   R: Anvil and donut, biopsy - Benign segment of colon with acute and chronic inflammation and fibrous adhesion  SPECIMEN    Procedure:    Radical hysterectomy     Procedure:    Omentectomy     Procedure:    Peritoneal tumor debulking     Hysterectomy Type:    Abdominal     Specimen Integrity:          Right Ovary Integrity:     Capsule intact     Specimen Integrity:          Left Ovary Integrity:    Capsule  intact   TUMOR    Tumor Site:    Bilateral ovaries     Tumor Size:    Greatest Dimension (Centimeters): 19.5 cm     Histologic Type:    Mixed malignant germ cell tumor:  yolk sac tumor, mature teratoma, mature neural elements, immature neural tissue, and immature cartilage     Ovarian Surface Involvement:    Present, right and left     Other Tissue / Organ Involvement:    Right ovary     Other Tissue / Organ Involvement:    Left ovary     Other Tissue / Organ Involvement:    Right fallopian tube     Other Tissue / Organ Involvement:    Omentum     Other Tissue / Organ Involvement:    Diaphragm and splenic capsule     Largest Extrapelvic Peritoneal Focus:    Macroscopic (greater than 2 cm): Splenic capsule     Peritoneal / Ascitic Fluid Involvement:    Not submitted / unknown   REGIONAL LYMPH NODES     Regional Lymph Node Status:    Not applicable (no regional lymph nodes submitted or found)   DISTANT METASTASIS     Distant Site(s) Involved:    Diaphragm, splenic capsule   pTNM CLASSIFICATION (AJCC 8th Edition)     Reporting of pT, pN, and (when applicable) pM categories is based on information available to the pathologist at the time the report is issued. As per the AJCC (Chapter 1, 8th Ed.) it is the managing physician's responsibility to establish the final pathologic stage based upon all pertinent information, including but potentially not limited to this pathology report.     Modified Classification:    y     pT Category:    pT3c     pN Category:    pN not assigned (no nodes submitted or found)     pM Category:    pM1   FIGO STAGE     FIGO Stage:    IIIC   COMMENT:  The needle core biopsies show a heterogeneous combination of elements including abundant neural tissue with atypical and immature features. There are also atypical glandular epithelial structures and focal yolk sac elements with  microcystic pattern.  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  Immunohistochemistry is performed for cytokeratin AE1/AE3, CD117, CD30, G FAP, glypican-3, OCT3/4, synaptophysin, S100 and CD30.     03/14/2022 - 05/02/2022 Chemotherapy   Patient is on Treatment Plan : Malignant germ cell tumor of ovary D1-5 q21d     03/22/2022 Tumor Marker   Patient's tumor was tested for the following markers: AFP. Results of the tumor marker test revealed 7.9.   04/23/2022 Genetic Testing   Negative genetic testing on the CancerNext-Expanded+RNAinsight panel.  The report date is April 23, 2022.  The CancerNext-Expanded gene panel offered by [facility] and includes sequencing and rearrangement analysis for the following 77 genes: AIP, ALK, APC*, ATM*, AXIN2, BAP1, BARD1, BMPR1A, BRCA1*, BRCA2*, BRIP1*, CDC73, CDH1*, CDK4, CDKN1B, CDKN2A, CHEK2*, CTNNA1, DICER1, FH, FLCN, KIF1B, LZTR1, MAX, MEN1, MET, MLH1*, MSH2*, MSH3, MSH6*, MUTYH*, NF1*, NF2, NTHL1, PALB2*, PHOX2B, PMS2*, POT1, PRKAR1A, PTCH1, PTEN*, RAD51C*, RAD51D*, RB1, RET, SDHA, SDHAF2, SDHB, SDHC, SDHD, SMAD4, SMARCA4, SMARCB1, SMARCE1, STK11, SUFU, TMEM127, TP53*, TSC1, TSC2, and VHL (sequencing and deletion/duplication); EGFR, EGLN1, HOXB13, KIT, MITF, PDGFRA, POLD1, and POLE (sequencing only); EPCAM and GREM1 (deletion/duplication only). DNA and  RNA analyses performed for * genes.    05/17/2022 Imaging   CT ABDOMEN PELVIS W WO CONTRAST  Result Date: 05/17/2022 CLINICAL DATA:  History of ovarian cancer status post recent section and completion of chemotherapy 1-1/2 weeks ago. Evaluation of possible ileostomy reversal. EXAM: CT ABDOMEN AND PELVIS WITHOUT AND WITH CONTRAST TECHNIQUE: Multidetector CT imaging of the abdomen and pelvis was performed following the standard protocol before and following the bolus administration of intravenous contrast. RADIATION DOSE REDUCTION: This  exam was performed according to the departmental dose-optimization program which includes automated exposure control, adjustment of the mA and/or kV according to patient size and/or use of iterative reconstruction technique. CONTRAST:  OMNIPAQUE  IOHEXOL  300 MG/ML  SOLN COMPARISON:  CT abdomen and pelvis dated 02/09/2022 FINDINGS: Lower chest: No focal consolidation or pulmonary nodule in the lung bases. No pleural effusion or pneumothorax demonstrated. Partially imaged heart size is normal. Hepatobiliary: Postsurgical changes of the right hepatic dome with irregular capsular retraction. Punctate adjacent focus of subdiaphragmatic calcification (11:32). Unchanged subcentimeter hypoattenuating focus along posterior segment 7 (4:18). No intra or extrahepatic biliary ductal dilation. Cholecystectomy. Pancreas: No focal lesions or main ductal dilation. Spleen: Splenectomy. Lobulated, enhancing soft tissue density along the left hemidiaphragm at the splenectomy site measures 1.5 x 1.0 cm (4:11). Adrenals/Urinary Tract: No adrenal nodules. No suspicious renal mass or hydronephrosis. Punctate nonobstructing left lower pole stones. No focal bladder wall thickening. Stomach/Bowel: Normal appearance of the stomach. Right upper quadrant ileostomy. Enteric contrast material is present within the medial limb of the ileostomy. Appendectomy. Vascular/Lymphatic: Aortic atherosclerosis. No enlarged abdominal or pelvic lymph nodes. Reproductive: Hysterectomy and bilateral salpingo-oophorectomy. Ill-defined soft tissue near the left vaginal (4:66). No adnexal masses. Other: Status post omentectomy. Ill-defined lateral peritoneal thickening, for example series 4 image 38 on the right and image 36 on the left associated with scattered foci of mesenteric nodularity, for example 7 x 5 mm (4:34). Musculoskeletal: No acute or abnormal lytic or blastic osseous lesions. Postsurgical changes of the anterior abdominal wall. IMPRESSION: 1.  Extensive postsurgical changes of the abdomen and pelvis. Right upper quadrant ileostomy with enteric contrast material present within the medial limb of the ileostomy. No evidence of obstruction. 2. Multifocal ill-defined soft tissue densities as described, indeterminate, may reflect postsurgical change versus residual tumor. 3. Lobulated, enhancing soft tissue density along the left hemidiaphragm at the splenectomy site may represent residual/recurrent splenic tissue versus residual tumor, although the enhancement pattern does not appear consistent with pre-surgical appearance of the malignancy. 4.  Aortic Atherosclerosis (ICD10-I70.0). Electronically Signed   By: Limin  Xu M.D.   On: 05/17/2022 16:43      06/09/2022 Imaging   CT ABDOMEN PELVIS WO CONTRAST  Result Date: 06/09/2022 CLINICAL DATA:  Ovarian cancer, status post chemotherapy. Rectal contrast administered to evaluate for anastomotic patency prior to reversal. EXAM: CT ABDOMEN AND PELVIS WITHOUT CONTRAST TECHNIQUE: Multidetector CT imaging of the abdomen and pelvis was performed following the standard protocol without IV contrast. RADIATION DOSE REDUCTION: This exam was performed according to the departmental dose-optimization program which includes automated exposure control, adjustment of the mA and/or kV according to patient size and/or use of iterative reconstruction technique. COMPARISON:  05/17/2022 FINDINGS: Lower chest: Lung bases are clear. Hepatobiliary: Unenhanced liver is unremarkable. Status post cholecystectomy. No intrahepatic or extrahepatic ductal dilatation. Pancreas: Within normal limits. 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 Adrenals/Urinary Tract: Adrenal glands are within normal limits. Right kidney is within normal limits. 3 mm nonobstructing left  lower pole renal calculus (series 2/image 22). No hydronephrosis. Bladder is within normal limits. Stomach/Bowel: Stomach is within normal limits.  No evidence of bowel obstruction. 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 Status post left hemicolectomy with suture line in the lower pelvis (series 2/image 87). Rectal contrast confirms a patent anastomosis without evidence of leak. Vascular/Lymphatic: No evidence of abdominal aortic aneurysm. Atherosclerotic calcifications of the abdominal aorta and branch vessels. No suspicious abdominopelvic lymphadenopathy. Reproductive: Status post hysterectomy and suspected bilateral salpingo oophorectomy. Other: No abdominopelvic ascites. Mild peritoneal nodularity along the lateral left mid abdomen, with discrete 8 mm nodule (series 2/image 33), more conspicuous than on the prior. Mild nonspecific mesenteric stranding along the jejunal mesentery in central left mid abdomen (series 2/image 30). Mildly prominent soft tissue along the left pelvic cul-de-sac measuring up to 2.0 cm (series 2/image 59), grossly unchanged. Overall appearance suggests very mild peritoneal disease which is overall grossly unchanged. Musculoskeletal: Visualized osseous structures are within normal limits. 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 Rectal contrast confirms a patent anastomosis without evidence of leak. 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 Additional ancillary findings as above. Electronically Signed   By: Pinkie Pebbles M.D.   On: 06/09/2022 01:59   CT ABDOMEN PELVIS W WO CONTRAST  Result Date: 05/17/2022 CLINICAL DATA:  History of ovarian cancer status post recent section and completion of chemotherapy 1-1/2 weeks ago. Evaluation of possible ileostomy reversal. EXAM: CT ABDOMEN AND PELVIS WITHOUT AND WITH CONTRAST TECHNIQUE: Multidetector CT imaging of the abdomen and pelvis was performed following the standard protocol before and following the bolus administration of intravenous contrast. RADIATION DOSE REDUCTION: This exam was  performed according to the departmental dose-optimization program which includes automated exposure control, adjustment of the mA and/or kV according to patient size and/or use of iterative reconstruction technique. CONTRAST:  OMNIPAQUE  IOHEXOL  300 MG/ML  SOLN COMPARISON:  CT abdomen and pelvis dated 02/09/2022 FINDINGS: Lower chest: No focal consolidation or pulmonary nodule in the lung bases. No pleural effusion or pneumothorax demonstrated. Partially imaged heart size is normal. Hepatobiliary: Postsurgical changes of the right hepatic dome with irregular capsular retraction. Punctate adjacent focus of subdiaphragmatic calcification (11:32). Unchanged subcentimeter hypoattenuating focus along posterior segment 7 (4:18). No intra or extrahepatic biliary ductal dilation. Cholecystectomy. Pancreas: No focal lesions or main ductal dilation. Spleen: Splenectomy. Lobulated, enhancing soft tissue density along the left hemidiaphragm at the splenectomy site measures 1.5 x 1.0 cm (4:11). Adrenals/Urinary Tract: No adrenal nodules. No suspicious renal mass or hydronephrosis. Punctate nonobstructing left lower pole stones. No focal bladder wall thickening. Stomach/Bowel: Normal appearance of the stomach. Right upper quadrant ileostomy. Enteric contrast material is present within the medial limb of the ileostomy. Appendectomy. Vascular/Lymphatic: Aortic atherosclerosis. No enlarged abdominal or pelvic lymph nodes. Reproductive: Hysterectomy and bilateral salpingo-oophorectomy. Ill-defined soft tissue near the left vaginal (4:66). No adnexal masses. Other: Status post omentectomy. Ill-defined lateral peritoneal thickening, for example series 4 image 38 on the right and image 36 on the left associated with scattered foci of mesenteric nodularity, for example 7 x 5 mm (4:34). Musculoskeletal: No acute or abnormal lytic or blastic osseous lesions. Postsurgical changes of the anterior abdominal wall. IMPRESSION: 1. Extensive  postsurgical changes of the abdomen and pelvis. Right upper quadrant ileostomy with enteric contrast material present within the medial limb of the ileostomy. No evidence of obstruction. 2. Multifocal ill-defined soft tissue densities as described, indeterminate, may reflect postsurgical change versus residual tumor.  3. Lobulated, enhancing soft tissue density along the left hemidiaphragm at the splenectomy site may represent residual/recurrent splenic tissue versus residual tumor, although the enhancement pattern does not appear consistent with pre-surgical appearance of the malignancy. 4.  Aortic Atherosclerosis (ICD10-I70.0). Electronically Signed   By: Limin  Xu M.D.   On: 05/17/2022 16:43      07/01/2022 Surgery   Pre-operative Diagnosis: Ileostomy, planned reversal   Post-operative Diagnosis: same as above   Operation: Ileostomy takedown   Surgeon: Viktoria Crank MD    Operative Findings: Normal-appearing loop ileostomy with some mild retraction.  Some filmy adhesions around the loop of ileum itself and its mesentery.  Fascia easily identified and normal in appearance.  Ileum freed from surrounding anterior abdominal wall peritoneum.     08/09/2022 Tumor Marker   Patient's tumor was tested for the following markers: CA-125. Results of the tumor marker test revealed 12.6.   08/10/2022 Tumor Marker   Patient's tumor was tested for the following markers: AFP. Results of the tumor marker test revealed 3.   10/06/2022 Imaging   CT A/P: No acute abdominal or pelvic pathology.    01/19/2023 Imaging   CT A/P: 1. New subtle 6 mm hypodensity in the left lobe of the liver, nonspecific consider attention on short-term interval follow-up CT or potentially more definitive characterization by pre and postcontrast enhanced abdominal MRI. 2. No other evidence to suggest metastatic disease in the abdomen or pelvis. 3. Punctate nonobstructive left lower pole renal stones. 4. Moderate volume of  formed stool in the colon. 5.  Aortic Atherosclerosis (ICD10-I70.0).   01/20/2023 Tumor Marker   Patient's tumor was tested for the following markers: CA-125, AFP and HCG. Results of the tumor marker test revealed CA-125 11.5, AFP 4.1, HCG <1.   01/23/2023 Imaging   MRI liver: 1. No solid liver abnormality is seen. Specifically, no mass or suspicious abnormality in the central left lobe of the liver to correspond to finding of prior CT. 2. Status post splenectomy. Unchanged postoperative soft tissue in the left upper quadrant. 3. Hepatic parenchymal iron deposition. 4. Large burden of stool throughout the colon.   04/18/2023 Tumor Marker   Patient's tumor was tested for the following markers: CA-125, AFP and HCG. Results of the tumor marker test revealed CA-125 11.2, AFP 3.7 HCG <1.     07/31/2023 Imaging   CT CHEST ABDOMEN PELVIS W CONTRAST Result Date: 07/31/2023 CLINICAL DATA:  History of ovarian cancer, monitor. * Tracking Code: BO * EXAM: CT CHEST, ABDOMEN, AND PELVIS WITH CONTRAST TECHNIQUE: Multidetector CT imaging of the chest, abdomen and pelvis was performed following the standard protocol during bolus administration of intravenous contrast. RADIATION DOSE REDUCTION: This exam was performed according to the departmental dose-optimization program which includes automated exposure control, adjustment of the mA and/or kV according to patient size and/or use of iterative reconstruction technique. CONTRAST:  OMNIPAQUE  IOHEXOL  300 MG/ML  SOLN COMPARISON:  Multiple priors including CT April 17, 2023 FINDINGS: CT CHEST FINDINGS Cardiovascular: Accessed right chest Port-A-Cath with tip in the SVC. Normal caliber thoracic aorta. Normal size heart. No significant pericardial effusion/thickening. Mediastinum/Nodes: No suspicious thyroid  nodule. Similar feathery crescentic soft tissue in the anterior mediastinum which conforms underlying vasculature and is favored thymic tissue. No pathologically  enlarged mediastinal, hilar or axillary lymph nodes. Patulous esophagus. Lungs/Pleura: Stable triangular 2 mm pulmonary nodule along the left major fissure on image 83/4. No new suspicious pulmonary nodules or masses. Musculoskeletal: No aggressive lytic or blastic lesion of bone.  CT ABDOMEN PELVIS FINDINGS Hepatobiliary: Stable small cyst in the left hepatic dome and technically too small to accurately characterize hypodense lesion in the inferior right lobe of the liver on image 67/2. No new suspicious hepatic lesion. Gallbladder surgically absent. Similar mild prominence of the biliary tree. Pancreas: No pancreatic ductal dilation or evidence of acute inflammation. Spleen: Surgically absent. Similar soft tissue in the left upper quadrant favored a small splenule. Adrenals/Urinary Tract: No suspicious adrenal nodule/mass. No hydronephrosis. Kidneys demonstrate symmetric enhancement. Urinary bladder is unremarkable for degree of distension. Stomach/Bowel: Stomach is unremarkable for degree of distension. No pathologic dilation of small or large bowel. Colonic stool burden compatible with constipation. Rectosigmoid anastomotic sutures. Vascular/Lymphatic: Normal caliber abdominal aorta. Smooth IVC contours. The portal, splenic and superior mesenteric veins are patent. No pathologically enlarged abdominal or pelvic lymph nodes. Reproductive: Similar size of the asymmetric soft tissue nodularity along the left vaginal cuff measuring 3.8 x 2.6 cm on image 106/2 previously 3.8 x 2.5 cm when remeasured in similar fashion and on CT January 19 2023 measuring 3.5 x 2.5 cm again when remeasured for similar technique and 3.6 x 2.5 cm on CT October 06, 2022. Other: No significant abdominopelvic free fluid. Musculoskeletal: No aggressive lytic or blastic lesion of bone. IMPRESSION: 1. Similar size of the asymmetric soft tissue nodularity along the left vaginal cuff, but the soft tissue prominence within the measurement appears  subtly more prominent over consecutive examinations, suggest more definitive assessment by pelvic MRI. 2. Stable triangular 2 mm pulmonary nodule along the left major fissure, favored to reflect a benign intrapulmonary lymph node. 3. Colonic stool burden compatible with constipation. Electronically Signed   By: Reyes Holder M.D.   On: 07/31/2023 14:36      08/01/2023 Tumor Marker   Patient's tumor was tested for the following markers: CA-125, AFP and HCG. Results of the tumor marker test revealed CA-125 is 12.8.   10/05/2023 Tumor Marker   Patient's tumor was tested for the following markers: CA-125, AFP and HCG. Results of the tumor marker test revealed CA-125 is 12.7, HCG 2, AFP 3.5     Interval History: Overall doing well.  Starting a new job October 1.  Continues to have anxiety related to lab and imaging testing, coming to the cancer center for visits.  Denies any abdominal or pelvic pain.  Reports baseline bowel and bladder function.  Denies any vaginal bleeding.  Has mild intermittent hot flashes, manageable.  Past Medical/Surgical History: Past Medical History:  Diagnosis Date   Arthritis    Endometriosis    Family history of breast cancer    Family history of breast cancer in mother    at age 63   GAD (generalized anxiety disorder)    lexapro helped 2017 (Dr. Adel did have extra stress of the Master's degree program on her at that time.  Got counseling.       GERD (gastroesophageal reflux disease)    Gestational diabetes    History of kidney stones    Low back pain    Dr. Zach Smith.   Neck pain    and left shoulder pain: managed by osteopathic manipulation by Dr. Darlyn Sharps. ?cervical radiculopathy   ovarian ca 12/01/2021   PAC (premature atrial contraction)    PONV (postoperative nausea and vomiting)     Past Surgical History:  Procedure Laterality Date   ABDOMINAL EXPLORATION SURGERY  2009   fulguration of endometriosis - [facility] surgery   DEBULKING   02/15/2022  revoved uterus and ovaries, omentun, gallbladder appendix, spleen  and 6 inches of colon   ILEOSTOMY  02/15/2022   ILEOSTOMY CLOSURE N/A 06/22/2022   Procedure: ILEOSTOMY TAKEDOWN;  Surgeon: Viktoria Comer SAUNDERS, MD;  Location: [facility];  Service: General;  Laterality: N/A;   IR IMAGING GUIDED PORT INSERTION  12/08/2021   IR US  GUIDE BX ASP/DRAIN  12/08/2021   SALPINGECTOMY Right 2009   TONSILLECTOMY     TUBAL LIGATION Left 2009    Family History  Problem Relation Age of Onset   Rheum arthritis Mother    Stroke Mother    Hypertension Mother    Heart disease Mother    Arthritis Mother    Breast cancer Mother 54   Heart attack Mother 51   Diabetes Father    Heart disease Father    Hyperlipidemia Father    Hypertension Father    Heart attack Father 31   Non-Hodgkin's lymphoma Paternal Uncle 23   Heart disease Maternal Grandmother    Heart attack Maternal Grandmother 79   Arthritis Maternal Grandmother    Early death Maternal Grandfather 30       MVA   Diabetes Paternal Grandmother    Hypertension Paternal Grandmother    Heart disease Paternal Grandmother    Heart attack Paternal Grandmother 44   Heart disease Paternal Grandfather    Heart attack Paternal Grandfather 71   Coronary artery disease Other    Glaucoma Other     Social History   Socioeconomic History   Marital status: Married    Spouse name: Not on file   Number of children: 3   Years of education: Not on file   Highest education level: Not on file  Occupational History   Occupation: Academic librarian: [employer]    Comment: at [employer]   Tobacco Use   Smoking status: Never   Smokeless tobacco: Never  Vaping Use   Vaping status: Never Used  Substance and Sexual Activity   Alcohol use: Not Currently    Comment: Occasionally   Drug use: No   Sexual activity: Yes    Partners: Male    Birth control/protection: Surgical  Other Topics Concern   Not on file  Social History  Narrative   Married, 3 kids.    Lives in [place].   Educ: Masters deg nursing.   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   No tob/drugs.   Alc: social.   Social Drivers of Health   Financial Resource Strain: Low Risk  (02/17/2022)   Received [facility]   Overall Financial Resource Strain (CARDIA)    Difficulty of Paying Living Expenses: Not very hard  Food Insecurity: No Food Insecurity (06/22/2022)   Hunger Vital Sign    Worried About Running Out of Food in the Last Year: Never true    Ran Out of Food in the Last Year: Never true  Transportation Needs: No Transportation Needs (06/22/2022)   PRAPARE - Administrator, Civil Service (Medical): No    Lack of Transportation (Non-Medical): No  Physical Activity: Insufficiently Active (11/18/2021)   Received from [facility]   Exercise Vital Sign    On average, how many days per week do you engage in moderate to strenuous exercise (like a brisk walk)?: 2 days    On average, how many minutes do you engage in exercise  at this level?: 40 min  Stress: No Stress Concern Present (11/18/2021)   Received from [facility] - Occupational Stress Questionnaire    Feeling of Stress : Only a little  Social Connections: Unknown (11/22/2022)   Received from [facility]   Social Network    Social Network: Not on file    Current Medications:  Current Outpatient Medications:    acetaminophen  (TYLENOL ) 650 MG CR tablet, Take 650 mg by mouth every 8 (eight) hours as needed for pain., Disp: , Rfl:    Cholecalciferol (VITAMIN D ) 50 MCG (2000 UT) tablet, Take 2,000 Units by mouth daily., Disp: , Rfl:    cyanocobalamin  (VITAMIN B12) 1000 MCG tablet, Take 1,000 mcg by mouth daily., Disp: , Rfl:    diphenhydrAMINE  (BENADRYL ) 25 MG tablet, Take 25 mg by mouth every 6 (six) hours as needed for allergies., Disp: , Rfl:     docusate sodium  (COLACE) 50 MG capsule, Take 50 mg by mouth 2 (two) times daily. (Patient taking differently: Take 50 mg by mouth daily as needed for mild constipation.), Disp: , Rfl:    famotidine  (PEPCID ) 10 MG tablet, Take 10 mg by mouth daily as needed for heartburn or indigestion., Disp: , Rfl:    hydrOXYzine  (ATARAX ) 10 MG tablet, Take 1 tablet (10 mg total) by mouth 3 (three) times daily as needed., Disp: 90 tablet, Rfl: 0   loratadine (CLARITIN) 10 MG tablet, Take by mouth., Disp: , Rfl:    LORazepam  (ATIVAN ) 0.5 MG tablet, Take 1 tablet (0.5 mg total) by mouth every 8 (eight) hours as needed for anxiety., Disp: 60 tablet, Rfl: 0   Magnesium  Glycinate 100 MG CAPS, Take 200 mg by mouth daily., Disp: , Rfl:    estradiol  (VIVELLE -DOT) 0.05 MG/24HR patch, Place 1 patch (0.05 mg total) onto the skin 2 (two) times a week., Disp: 8 patch, Rfl: 12   sertraline  (ZOLOFT ) 25 MG tablet, Take 1 tablet (25 mg total) by mouth daily., Disp: 30 tablet, Rfl: 2  Review of Systems: Denies appetite changes, fevers, chills, fatigue, unexplained weight changes. Denies hearing loss, neck lumps or masses, mouth sores, ringing in ears or voice changes. Denies cough or wheezing.  Denies shortness of breath. Denies chest pain or palpitations. Denies leg swelling. Denies abdominal distention, pain, blood in stools, constipation, diarrhea, nausea, vomiting, or early satiety. Denies pain with intercourse, dysuria, frequency, hematuria or incontinence. Denies hot flashes, pelvic pain, vaginal bleeding or vaginal discharge.   Denies joint pain, back pain or muscle pain/cramps. Denies itching, rash, or wounds. Denies dizziness, headaches, numbness or seizures. Denies swollen lymph nodes or glands, denies easy bruising or bleeding. Denies anxiety, depression, confusion, or decreased concentration.  Physical Exam: BP 110/72 Comment: manual recheck,  Pulse 100 Comment: manual recheck, MD aware  Temp 98.5 F (36.9 C)  (Oral)   Resp 19   Wt 121 lb (54.9 kg)   LMP  (LMP Unknown) Comment: tubal ligation  SpO2 100%   BMI 24.44 kg/m  General: Alert, oriented, no acute distress. HEENT: Normocephalic, atraumatic, sclera anicteric. Chest: Clear to auscultation bilaterally.  No wheezes or rhonchi. Cardiovascular: Regular rate and rhythm, no murmurs. Abdomen: soft, nontender.  Normoactive bowel sounds.  No masses or hepatosplenomegaly appreciated.  Well-healed incisions. Extremities: Grossly normal range of motion.  Warm, well perfused.  No edema bilaterally. Skin: No rashes or lesions noted. Lymphatics: No cervical, supraclavicular, or inguinal adenopathy. GU: Normal appearing external genitalia without erythema, excoriation, or  lesions.  Speculum exam reveals mildly atrophic vaginal mucosa, no lesions noted.  Bimanual exam reveals smooth, no masses or nodularity.  There is some mild thickness at the vaginal cuff on the right appreciated only on rectovaginal exam.  No nodularity or firmness here.  Laboratory & Radiologic Studies:          Component Ref Range & Units (hover) 11 d ago (10/02/23) 2 mo ago (07/31/23) 5 mo ago (04/17/23) 8 mo ago (01/19/23) 12 mo ago (10/14/22) 1 yr ago (08/08/22) 1 yr ago (06/07/22)  Cancer Antigen (CA) 125 12.7 12.8 CM 11.2 CM 11.7 CM 10.2 CM 12.6 CM 17.5               Component Ref Range & Units (hover) 11 d ago (10/02/23) 2 mo ago (08/04/23) 5 mo ago (04/17/23) 8 mo ago (01/19/23) 1 yr ago (08/08/22) 1 yr ago (06/07/22) 1 yr ago (03/21/22)  AFP, Serum, Tumor Marker 3.5 3.6 CM 3.7 CM 4.1 CM 3.0 R, CM 3.2 R, CM 7.9 High  R, CM              Component Ref Range & Units (hover) 11 d ago (10/02/23) 2 mo ago (08/04/23) 5 mo ago (04/17/23) 8 mo ago (01/19/23) 12 mo ago (10/14/22) 1 yr ago (08/08/22) 1 yr ago (02/10/22)  hCG Quant 2 2 CM 1 CM 1 CM <1 CM <1 CM <1 CM     Assessment & Plan: [name] is a 53 y.o. 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 She has completed 3 cycles of adjuvant chemotherapy with etoposide  and cisplatin . She is s/p ileostomy reversal on 06/22/2022. CT imaging in 07/2023 with slightly more prominent asymmetric soft tissue nodularity along the left vaginal cuff.  On MRI, this complex cystic and solid lesion measured 3.7 cm, thought to be slightly increased in size compared to CT scans dating back to 09/2022.  Minimal uptake of this area on PET scan.  Discussed option of close imaging surveillance versus attempt at biopsy with plan for close imaging follow-up.   The patient is overall doing very well.  Discussed very mild findings on rectovaginal exam, which may be related to findings on imaging.   Recent labs (CA-125, AFP and hcg) all normal.     Menopausal symptoms managed on estrogen replacement therapy.   Given her anxiety with regard to testing and appointments at the cancer center, discussed trying to get her on a schedule where she comes every 3 months.  We will work to get her exam/visits, tumor markers, and CT imaging all around the same time every 3 months.  For her upcoming scan, she would prefer to move this from mid September when it is currently scheduled 2 October.  I will plan to see her next in late November and we will get repeat tumor markers at that visit.   22 minutes of total time was spent for this patient encounter, including preparation, face-to-face counseling with the patient and coordination of care, and documentation of the encounter.  Comer Dollar, MD  Division of Gynecologic Oncology  Department of Obstetrics and Gynecology  [facility]

## 2023-10-13 NOTE — Patient Instructions (Addendum)
 It was good to see you today.   I will see you for follow-up in 3 months with labs.  As always, if you develop any new and concerning symptoms before your next visit, please call to see me sooner.

## 2023-10-17 DIAGNOSIS — F4323 Adjustment disorder with mixed anxiety and depressed mood: Secondary | ICD-10-CM | POA: Diagnosis not present

## 2023-10-25 ENCOUNTER — Other Ambulatory Visit (HOSPITAL_BASED_OUTPATIENT_CLINIC_OR_DEPARTMENT_OTHER): Payer: Self-pay

## 2023-10-30 ENCOUNTER — Ambulatory Visit (HOSPITAL_COMMUNITY)

## 2023-11-13 ENCOUNTER — Inpatient Hospital Stay: Attending: Gynecologic Oncology

## 2023-11-28 ENCOUNTER — Other Ambulatory Visit (HOSPITAL_BASED_OUTPATIENT_CLINIC_OR_DEPARTMENT_OTHER): Payer: Self-pay

## 2023-12-04 ENCOUNTER — Inpatient Hospital Stay

## 2023-12-04 ENCOUNTER — Ambulatory Visit (HOSPITAL_COMMUNITY)
Admission: RE | Admit: 2023-12-04 | Discharge: 2023-12-04 | Disposition: A | Discharge status: Home / Self Care | Source: Ambulatory Visit | Attending: Gynecologic Oncology | Admitting: Gynecologic Oncology

## 2023-12-04 ENCOUNTER — Encounter: Payer: Self-pay | Admitting: Hematology and Oncology

## 2023-12-04 DIAGNOSIS — C569 Malignant neoplasm of unspecified ovary: Secondary | ICD-10-CM | POA: Diagnosis present

## 2023-12-04 DIAGNOSIS — K769 Liver disease, unspecified: Secondary | ICD-10-CM | POA: Diagnosis not present

## 2023-12-04 DIAGNOSIS — Z95828 Presence of other vascular implants and grafts: Secondary | ICD-10-CM | POA: Insufficient documentation

## 2023-12-04 DIAGNOSIS — Z9081 Acquired absence of spleen: Secondary | ICD-10-CM | POA: Diagnosis not present

## 2023-12-04 DIAGNOSIS — Z9049 Acquired absence of other specified parts of digestive tract: Secondary | ICD-10-CM | POA: Diagnosis not present

## 2023-12-04 DIAGNOSIS — I700 Atherosclerosis of aorta: Secondary | ICD-10-CM | POA: Diagnosis not present

## 2023-12-04 LAB — COMPREHENSIVE METABOLIC PANEL WITH GFR
ALT: 28 U/L (ref 0–44)
AST: 20 U/L (ref 15–41)
Albumin: 4.6 g/dL (ref 3.5–5.0)
Alkaline Phosphatase: 55 U/L (ref 38–126)
Anion gap: 9 (ref 5–15)
BUN: 11 mg/dL (ref 6–20)
CO2: 25 mmol/L (ref 22–32)
Calcium: 9.6 mg/dL (ref 8.9–10.3)
Chloride: 103 mmol/L (ref 98–111)
Creatinine, Ser: 0.69 mg/dL (ref 0.44–1.00)
GFR, Estimated: >60 mL/min (ref >60)
Glucose, Bld: 113 mg/dL — ABNORMAL HIGH (ref 70–99)
Potassium: 3.8 mmol/L (ref 3.5–5.1)
Sodium: 137 mmol/L (ref 135–145)
Total Bilirubin: 0.6 mg/dL (ref 0.0–1.2)
Total Protein: 7.4 g/dL (ref 6.5–8.1)

## 2023-12-04 LAB — CBC WITH DIFFERENTIAL/PLATELET
Abs Immature Granulocytes: 0.05 K/uL (ref 0.00–0.07)
Basophils Absolute: 0.1 K/uL (ref 0.0–0.1)
Basophils Relative: 1 %
Eosinophils Absolute: 0 K/uL (ref 0.0–0.5)
Eosinophils Relative: 0 %
HCT: 41 % (ref 36.0–46.0)
Hemoglobin: 13.9 g/dL (ref 12.0–15.0)
Immature Granulocytes: 0 %
Lymphocytes Relative: 19 %
Lymphs Abs: 2.2 K/uL (ref 0.7–4.0)
MCH: 32.1 pg (ref 26.0–34.0)
MCHC: 33.9 g/dL (ref 30.0–36.0)
MCV: 94.7 fL (ref 80.0–100.0)
Monocytes Absolute: 1.1 K/uL — ABNORMAL HIGH (ref 0.1–1.0)
Monocytes Relative: 9 %
Neutro Abs: 8.2 K/uL — ABNORMAL HIGH (ref 1.7–7.7)
Neutrophils Relative %: 71 %
Platelets: 352 K/uL (ref 150–400)
RBC: 4.33 MIL/uL (ref 3.87–5.11)
RDW: 12.4 % (ref 11.5–15.5)
WBC: 11.6 K/uL — ABNORMAL HIGH (ref 4.0–10.5)
nRBC: 0 % (ref 0.0–0.2)

## 2023-12-04 MED ORDER — IOHEXOL 300 MG/ML  SOLN
100.0000 mL | Freq: Once | INTRAMUSCULAR | Status: AC | PRN
  Administered 2023-12-04: 100 mL via INTRAVENOUS

## 2023-12-04 MED ORDER — HEPARIN SOD (PORK) LOCK FLUSH 100 UNIT/ML IV SOLN
500.0000 [IU] | Freq: Once | INTRAVENOUS | Status: AC
  Administered 2023-12-04: 500 [IU] via INTRAVENOUS

## 2023-12-04 MED ORDER — SODIUM CHLORIDE 0.9% FLUSH
10.0000 mL | Freq: Once | INTRAVENOUS | Status: AC
  Administered 2023-12-04: 10 mL

## 2023-12-04 MED ORDER — SODIUM CHLORIDE (PF) 0.9 % IJ SOLN
INTRAMUSCULAR | Status: AC
  Filled 2023-12-04: qty 50

## 2023-12-04 MED ORDER — HEPARIN SOD (PORK) LOCK FLUSH 100 UNIT/ML IV SOLN
INTRAVENOUS | Status: AC
  Filled 2023-12-04: qty 5

## 2023-12-04 MED ORDER — IOHEXOL 9 MG/ML PO SOLN
500.0000 mL | ORAL | Status: AC
  Administered 2023-12-04 (×2): 500 mL via ORAL

## 2023-12-05 ENCOUNTER — Ambulatory Visit: Payer: Self-pay | Admitting: Gynecologic Oncology

## 2023-12-12 ENCOUNTER — Telehealth: Payer: Self-pay | Admitting: *Deleted

## 2023-12-12 NOTE — Telephone Encounter (Signed)
 Spoke with Ms. Scarpelli who called the office to reschedule her appt. With Dr. Viktoria from Friday, November 14 th. Pt was given an appt. With Eleanor Epps, NP for Tuesday, December 2nd at 1pm. Pt agreed to date and time and lab appt. Was also rescheduled for 12/2 at 1230.

## 2023-12-14 NOTE — Progress Notes (Signed)
 [name] [address] [place] [address] Phone: [phone] Subjective:   [name], am serving as a scribe for Dr. Arthea Claudene.  I'm seeing this patient by the request  of:  Patient, No Pcp Per  CC: Back and neck pain  [name]  [name] is a 53 y.o. female coming in with complaint of back and neck pain. OMT on 08/14/2023. Patient states that her lumbar spine is pain and L hip is popping. Feels like pop is very deep and this is when her back hurts. Frequency of popping has increased.   Medications patient has been prescribed: Zoloft   Taking:         Reviewed prior external information including notes and imaging from previsou exam, outside providers and external EMR if available.   As well as notes that were available from care everywhere and other healthcare systems.  Past medical history, social, surgical and family history all reviewed in electronic medical record.  No pertanent information unless stated regarding to the chief complaint.   Past Medical History:  Diagnosis Date   Arthritis    Endometriosis    Family history of breast cancer    Family history of breast cancer in mother    at age 4   GAD (generalized anxiety disorder)    lexapro helped 2017 (Dr. Adel did have extra stress of the Master's degree program on her at that time.  Got counseling.       GERD (gastroesophageal reflux disease)    Gestational diabetes    History of kidney stones    Low back pain    Dr. Zach Spence Soberano.   Neck pain    and left shoulder pain: managed by osteopathic manipulation by Dr. [name]. ?cervical radiculopathy   ovarian ca 12/01/2021   PAC (premature atrial contraction)    PONV (postoperative nausea and vomiting)     Allergies  Allergen Reactions   Latex Other (See Comments)    Sensitivity only   Sulfa Antibiotics Hives and Other (See Comments)   Sulfamethoxazole-Trimethoprim Rash   Septra [Bactrim]  Rash     Review of Systems:  No headache, visual changes, nausea, vomiting, diarrhea, constipation, dizziness, abdominal pain, skin rash, fevers, chills, night sweats, weight loss, swollen lymph nodes, body aches, joint swelling, chest pain, shortness of breath, mood changes. POSITIVE muscle aches  Objective  Blood pressure 110/72, pulse 75, height 4' 11 (1.499 m), weight 123 lb (55.8 kg), SpO2 98%.   General: No apparent distress alert and oriented x3 mood and affect normal, dressed appropriately.  HEENT: Pupils equal, extraocular movements intact  Respiratory: Patient's speak in full sentences and does not appear short of breath  Cardiovascular: No lower extremity edema, non tender, no erythema  Gait relatively normal. MSK:  Back does have significant anterior rotation of the left hip.  No severe tenderness over the sacroiliac joint on the left side.  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  Responds well to manipulation.  Do not feel further workup for the cyst is necessary at this time.  Discussed which activities to do and was to avoid increase activity slowly.  Follow-up with me again in 6 to 8 weeks.    Nonallopathic problems  Decision today to treat with OMT was based on Physical Exam  After verbal consent patient was treated with HVLA, ME, FPR techniques in cervical, rib, thoracic, lumbar, and sacral  areas  Patient tolerated the procedure well with improvement in symptoms  Patient given exercises, stretches and lifestyle modifications  See medications in patient instructions if  given  Patient will follow up in 4-8 weeks    The above documentation has been reviewed and is accurate and complete Habib Kise M Raul Winterhalter, DO          Note: This dictation was prepared with Dragon dictation along with smaller phrase technology. Any transcriptional errors that result from this process are unintentional.

## 2023-12-18 ENCOUNTER — Encounter: Payer: Self-pay | Admitting: Family Medicine

## 2023-12-18 ENCOUNTER — Ambulatory Visit: Admitting: Family Medicine

## 2023-12-18 VITALS — BP 110/72 | HR 75 | Ht 59.0 in | Wt 123.0 lb

## 2023-12-18 DIAGNOSIS — G8929 Other chronic pain: Secondary | ICD-10-CM

## 2023-12-18 DIAGNOSIS — M9904 Segmental and somatic dysfunction of sacral region: Secondary | ICD-10-CM

## 2023-12-18 DIAGNOSIS — M9903 Segmental and somatic dysfunction of lumbar region: Secondary | ICD-10-CM | POA: Diagnosis not present

## 2023-12-18 DIAGNOSIS — M9902 Segmental and somatic dysfunction of thoracic region: Secondary | ICD-10-CM | POA: Diagnosis not present

## 2023-12-18 DIAGNOSIS — M9908 Segmental and somatic dysfunction of rib cage: Secondary | ICD-10-CM | POA: Diagnosis not present

## 2023-12-18 DIAGNOSIS — M5441 Lumbago with sciatica, right side: Secondary | ICD-10-CM | POA: Diagnosis not present

## 2023-12-18 DIAGNOSIS — M9901 Segmental and somatic dysfunction of cervical region: Secondary | ICD-10-CM | POA: Diagnosis not present

## 2023-12-18 NOTE — Patient Instructions (Signed)
 Good to see you! Watch your sitting at work Keep being active See you again in 2-3 months

## 2023-12-18 NOTE — Assessment & Plan Note (Signed)
 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  Responds well to manipulation.  Do not feel further workup for the cyst is necessary at this time.  Discussed which activities to do and was to avoid increase activity slowly.  Follow-up with me again in 6 to 8 weeks.

## 2023-12-29 ENCOUNTER — Other Ambulatory Visit

## 2023-12-29 ENCOUNTER — Inpatient Hospital Stay: Admitting: Gynecologic Oncology

## 2023-12-29 ENCOUNTER — Inpatient Hospital Stay

## 2024-01-05 ENCOUNTER — Ambulatory Visit: Admitting: Gynecologic Oncology

## 2024-01-16 ENCOUNTER — Inpatient Hospital Stay: Attending: Gynecologic Oncology | Admitting: Gynecologic Oncology

## 2024-01-16 ENCOUNTER — Inpatient Hospital Stay

## 2024-01-16 VITALS — BP 138/78 | HR 100 | Temp 98.4°F | Resp 18 | Ht 59.0 in | Wt 129.0 lb

## 2024-01-16 DIAGNOSIS — F419 Anxiety disorder, unspecified: Secondary | ICD-10-CM | POA: Insufficient documentation

## 2024-01-16 DIAGNOSIS — Z9221 Personal history of antineoplastic chemotherapy: Secondary | ICD-10-CM | POA: Insufficient documentation

## 2024-01-16 DIAGNOSIS — Z90722 Acquired absence of ovaries, bilateral: Secondary | ICD-10-CM | POA: Diagnosis not present

## 2024-01-16 DIAGNOSIS — Z90710 Acquired absence of both cervix and uterus: Secondary | ICD-10-CM | POA: Diagnosis not present

## 2024-01-16 DIAGNOSIS — Z9079 Acquired absence of other genital organ(s): Secondary | ICD-10-CM | POA: Diagnosis not present

## 2024-01-16 DIAGNOSIS — Z8543 Personal history of malignant neoplasm of ovary: Secondary | ICD-10-CM | POA: Insufficient documentation

## 2024-01-16 DIAGNOSIS — C569 Malignant neoplasm of unspecified ovary: Secondary | ICD-10-CM

## 2024-01-16 LAB — COMPREHENSIVE METABOLIC PANEL WITH GFR
ALT: 32 U/L (ref 0–44)
AST: 25 U/L (ref 15–41)
Albumin: 4.6 g/dL (ref 3.5–5.0)
Alkaline Phosphatase: 58 U/L (ref 38–126)
Anion gap: 13 (ref 5–15)
BUN: 13 mg/dL (ref 6–20)
CO2: 23 mmol/L (ref 22–32)
Calcium: 9.3 mg/dL (ref 8.9–10.3)
Chloride: 103 mmol/L (ref 98–111)
Creatinine, Ser: 0.69 mg/dL (ref 0.44–1.00)
GFR, Estimated: >60 mL/min (ref >60)
Glucose, Bld: 116 mg/dL — ABNORMAL HIGH (ref 70–99)
Potassium: 3.7 mmol/L (ref 3.5–5.1)
Sodium: 139 mmol/L (ref 135–145)
Total Bilirubin: 0.5 mg/dL (ref 0.0–1.2)
Total Protein: 7.4 g/dL (ref 6.5–8.1)

## 2024-01-16 NOTE — Progress Notes (Signed)
 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 21.1 by 10.9 by 18.7 cm abdominopelvic mass, with a satellite mass in the right upper quadrant anteriorly, and moderate ascites with tumor deposits along the pelvic ascites posteriorly. The mass could be arising from right or left ovary, or both. The mass displaces the bowel and IVC, but there is no IVC thrombus or pelvic DVT identified. 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 2. Additional satellite mass above the liver is partially included, along with tumor deposits along the ascites. 3. Low-level edema along the lateral abdominal wall musculature. 4. Flattening of the IVC at the level of the aortic bifurcation due to the large abdominopelvic tumor. No findings of pelvic DVT.     12/07/2021 Imaging   CT abdomen and pelvis 1. Large central abdominal and pelvic mass consistent with known malignancy. This is incompletely visualized by this CT of the abdomen, but was seen on recent pelvic MRI. 2. 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 Right omental implant consistent with metastatic disease. Small amount of abdominal ascites. 3. No evidence for bowel or ureteral obstruction. 4. Aortic Atherosclerosis (ICD10-I70.0). 5. Chest findings dictated separately.   12/07/2021 Imaging   CT chest 1. Negative for pulmonary embolism. And no acute or metastatic process identified in the Chest. 2. Partially visible liver mass and ascites, staging CT Abdomen today is reported separately.     12/07/2021 Procedure    Successful placement of a right internal jugular approach power injectable Port-A-Cath. The catheter is ready for immediate use.   12/07/2021 Procedure   Ultrasound-guided biopsy of right upper quadrant mass.    12/08/2021 Initial Diagnosis   Ovarian cancer (HCC)   12/08/2021 Cancer Staging   Staging form: Ovary, Fallopian Tube, and Primary Peritoneal Carcinoma, AJCC 8th Edition - Clinical stage from 12/08/2021: FIGO Stage IIIC (cT3c, cN0, cM0) - Signed by Lonn Hicks, MD on 12/10/2021 Stage prefix: Initial diagnosis   12/20/2021 - 12/28/2021 Chemotherapy   Patient is on Treatment Plan : Ovarian germ cell tumor q21d x 3 Cycles     01/03/2022 Imaging   IMPRESSION: 1. Large central abdominopelvic mass compatible with malignancy, 3450 cubic cm in volume. This may be arising from the ovaries with slight eccentricity to the right in the pelvis. 2. Mild increase in size of the right omental tumor deposit. 3. Mild increase in size of the subcapsular mass along the dome of the right hepatic lobe, with suspected hepatic invasion. 4. Scattered malignant ascites. 5. No dilated bowel to suggest obstruction. 6. 2 mm left kidney lower pole nonobstructive renal calculus. 7. Mild abdominal aortic atherosclerotic vascular disease.   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  Postoperative Diagnosis: Status post R0 cytoreductive surgery and heating intraperitoneal chemotherapy  Procedures: LAPAROSCOPY, ABDOMEN, PERITONEUM, & OMENTUM, DIAGNOSTIC, W/WO COLLECTION SPECIMEN(S) BY BRUSHING OR WASHING  EXCISION/DESTRUCTION, OPEN, INTRA-ABD TUMOR/CYST/ENDOMETRIOMA, 1+ PERITONEAL/MESENTERI/RETROPERIT 5CM OR <COLECTOMY, PARTIAL; WITH ANASTOMOSIS COLOSTOMY OR SKIN LEVEL CECOSTOMY SPLENECTOMY; TOT (SEPART PROC) EXPLORATORY LAPAROTOMY, EXPLORATORY CELIOTOMY WITH OR WITHOUT  BIOPSY(S) HYPERTHERMIA, EXTERNALLY GENERATED;  DEEP CHEMOTHERAPY ADMINISTRATION INTO THE PERITONEAL CAVITY VIA INDWELLING PORT OR CATHETER RESECTION (INITIAL) OVARIAN, TUBAL/PRIM PERITONEAL MALIG W/BIL S&O/OMENTECT; W/RAD DISSECTION FOR DEBULKING CHOLECYSTECTOMY APPENDECTOMY OMENTECTOMY, EPIPLOECTOMY, RESECTION OF OMENTUM Procedures: Exploratory laparotomy, total abdominal hysterectomy with radical dissection, bilateral ureterolysis, bilateral salpingo-oophorectomy, posterior cul-de-sac peritonectomy, tumor debulking by Dr Viktoria with Gynecology Oncology Dr Wess with surgical oncology: Diagnostic laparoscopy, exploratory laparotomy, low anterior resection with primary end-to-end anastomosis, enterolysis, cholecystectomy, tumor capsule tumor debulking, splenectomy, appendectomy, omentectomy, diverting loop ileostomy formation  Surgeon: Comer Viktoria, MD  Findings: On diagnostic laparoscopy, only able to clearly visualize the left upper quadrant. Adhesions of bowel in the right upper quadrant limiting survey, minimal ascites noted in the pelvis, scattered tumor plaques visualized on left diaphragm. Given these findings, the decision was made to convert to exploratory laparotomy. Multiple loops of small bowel encased in a Cancn like structure in the midline with multiple filmy adhesions tethering to the superior aspect of the a large pelvic mass. Multicystic and necrotic pelvic mass originating from left ovary, approximately 18 x 20 cm incorporated within the left broad ligament with the left tube draped overlying the left ovarian mass. Bilateral tubes normal in appearance, normal-appearing uterus with smooth serosa. Right adnexa similarly with approximate 10 x 8 multicystic and necrotic mass. Diffuse tumor plaques encompassing posterior cul-de-sac peritoneum. Rectosigmoid densely adhered to posterior aspect of left adnexal mass requiring dissection and ultimately resection. Scattered treated subcentimeter nodules along small bowel mesentery. 5 x  5 cm necrotic and multicystic tumor at the level of the hepatic flexure. Enlarged similar multicystic necrotic tumor adherent between the right diaphragm and the liver without any evidence of liver invasion and tumor encased solely within the liver capsule. Multiple tumor nodules along the spleen requiring splenectomy. Omentum overall fairly normal in appearance with only scattered small tumor deposits, no omental cake was encountered. Please see surgical oncology operative note for further details regarding operative findings for their portion of the procedure.  Specimens: ID Type Source Tests Collected by Time Destination 1 : Abdominal fat pad Tissue Abdomen SURGICAL PATHOLOGY EXAM Wess Almarie Ghazi, MD 02/15/2022 [phone] 2 : Left adnexa Tissue Abdomen SURGICAL PATHOLOGY EXAM Wess Almarie Ghazi, MD 02/15/2022 [phone] 3 : right adnexa Tissue Abdomen SURGICAL PATHOLOGY EXAM Wess Almarie Ghazi, MD 02/15/2022 [phone] : Left perimetrium Tissue Uterus SURGICAL PATHOLOGY EXAM Viktoria Comer SAUNDERS, MD 02/15/2022 [phone] 5 : Uterus and cervix Tissue Uterus SURGICAL PATHOLOGY EXAM Wess Almarie Ghazi, MD 02/15/2022 1018 6 : Low Anterior Resection Tissue Abdomen SURGICAL PATHOLOGY EXAM Wess Almarie Ghazi, MD 02/15/2022 1055 7 : Right peritoneum Tissue Abdomen SURGICAL PATHOLOGY EXAM Wess Almarie Ghazi, MD 02/15/2022 1058 8 : Right retroperitoneal mass Tissue Abdomen SURGICAL PATHOLOGY EXAM Wess Almarie Ghazi, MD 02/15/2022 1101 9 : Omentum Tissue Omentum SURGICAL PATHOLOGY EXAM Wess Almarie Ghazi, MD 02/15/2022 1130 10 : Gallbladder Tissue Gallbladder SURGICAL PATHOLOGY EXAM Wess Almarie Ghazi, MD 02/15/2022 1143 11 : Right diaphragm Tissue Diaphragm SURGICAL PATHOLOGY EXAM Wess Almarie Ghazi, MD 02/15/2022 1143 12 : Left diaphragm nodule Tissue Diaphragm SURGICAL PATHOLOGY EXAM Wess Almarie Ghazi, MD 02/15/2022 1157 13 : Spleen Tissue Spleen SURGICAL PATHOLOGY EXAM Wess Almarie Ghazi, MD 02/15/2022 1204 14 : Appendix Tissue Appendix SURGICAL PATHOLOGY EXAM Wess Almarie Ghazi, MD 02/15/2022 1213 15 : Small bowel mesenteric nodules Tissue Small Bowel SURGICAL PATHOLOGY EXAM Wess Almarie Ghazi, MD 02/15/2022 1215 16 : Colon mesenteric nodule Tissue Colon SURGICAL PATHOLOGY EXAM Wess Almarie Ghazi, MD 02/15/2022 1221 17 : Hepatic flexure  mass Tissue Liver SURGICAL PATHOLOGY EXAM Wess Almarie Ghazi, MD 02/15/2022 1227  Complications: None  Indications for Procedure: [name] is a 53 y.o. 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 Prior to the procedure, all risks, benefits, and alternatives were discussed and informed surgical consent was signed.  Procedure: Patient was taken to the operating room where general anesthesia was achieved. She was positioned in dorsal lithotomy and prepped and draped per the surgical oncology team. A foley catheter was inserted into the bladder. A 5 mm skin incision was made in the left upper quadrant at Palmer's point, the abdomen was then entered with direct Optiview entry and the abdomen was insufflated. Findings were noted as above.  The laparoscope was then removed from the abdomen, the pneumoperitoneum was maintained. A vertical midline incision was made with the scalpel and the abdomen was entered sharply. The abdomen and pelvis were surveyed with findings as documented above. A wound protector was placed followed by the [facility] retractor.  The enlarged left adnexal mass was then carefully removed of its bilateral sidewall attachments using a mixture of blunt and cautery dissection. Multiple small bowel loops were adhered to the superior and posterior aspect of the mass with filmy adhesions which were carefully bluntly resected. The rectosigmoid was noted to be densely adhered to the posterior aspect of the pelvic mass.  Dissection was attempted using mixture of blunt dissection as well as sharp dissection with the Metzenbaum scissors, and unavoidable enterotomy of approximately 4 cm was made to the anterior surface of the rectosigmoid. This enterotomy was then closed with 4-0 PDS in a running fashion to minimize contamination. Further dissection removing the remainder of the rectosigmoid from the posterior aspect of the pelvic mass was then undertaken. The pelvic mass was then able to be fully elevated from the pelvis. The left infundibulopelvic ligament was able to be isolated with the left ureter clearly visualized below. The left ureter was further dissected away from the medial leaf of the broad ligament, and a vessel loop was placed. The left infundibulopelvic ligament was then isolated, clamped, and transected using the LigaSure device as well as and suture-ligated. The left utero-ovarian ligament was then isolated, clamped, cauterized and transected. The left pelvic mass was then removed entirely.  Attention was then turned to the right. An additional enlarged multicystic right adnexal mass was noted, this was carefully elevated from the pelvis using blunt dissection. The right round ligament was then grasped, elevated, and transected. The broad ligament was then opened posteriorly. The ureter was identified and the right infundibulopelvic ligament was then isolated, clamped, transected, and tied. The right utero ovarian ligament was then isolated, clamped, and transected using the LigaSure device and the right adnexal mass was removed.  Attention then was turned toward the hysterectomy. A sponge stick was placed within the vagina. The right broad ligament dissection was opened anteriorly and the bladder flap was developed. The left round ligament was then further transected, and the broad ligament opened posteriorly and anteriorly to complete the bladder flap. The right uterine artery was skeletonized, clamped,  transected, and suture-ligated. There was significant parametrial tumor involvement on the left side, and therefore a radical dissection was required to isolate the blood supply. The left ureter was further dissected down to the level near the trigone. The left uterine artery was traced to its origin, isolated, and clamped with vascular clips. Similarly the left  uterine vein due to significant necrotic tumor involvement was friable. Detachment was similarly carefully isolated, and clipped with vascular clips. Sequential clamps were then used to transect the remainder of the broad and cardinal ligaments bilaterally, with each pedicle being suture-ligated. A large right angle clamp was then able to be placed below the cervix, and the uterus and cervix were amputated from the vagina. The vaginal cuff was then closed with several figure-of-eight sutures using 0 Vicryl.    02/15/2022 Pathology Results   A: Abdominal fat pad, excision - Fibroadipose tissue with residual mixed malignant germ cell tumor with treatment effect   B: Left adnexa, salpingo-oophorectomy - Residual mixed malignant germ cell tumor with treatment effect, [id number] (See Comment and Synoptic report) - FIGO stage IIIC - Fallopian tubes with no tumor present   C: Right adnexa, salpingo-oophorectomy - Residual mixed malignant germ cell tumor with treatment effect (See Synoptic report) - Fallopian tubes not identified   D: Left parametrium, biopsy - Fibroadipose tissue with residual mixed malignant germ cell tumor with treatment effect   E: Uterus and cervix, hysterectomy - Right adnexal soft tissue with residual mixed malignant germ cell tumor with treatment effect   Myometrium: - Adenomyosis - Leiomyomata measuring up to 0.7 cm - Extensive chronic serositis   Cervix:  - Ectocervix and endocervix with chronic cervicitis    Endometrium: - Inactive endometrium   F: Low anterior resection - Residual mixed malignant  germ cell  tumor with treatment effect involving colonic serosa and pericolonic adipose tissue - Proximal and distal colonic resection margins with chronic serositis, but negative for tumor    G: Right peritoneum, biopsy - Fibroadipose tissue with residual mixed malignant germ cell tumor with treatment effect   H: Right retroperitoneal mass, excision - Fibroadipose tissue with residual mixed malignant germ cell tumor with treatment effect   I: Omentum, omentectomy - Fibroadipose tissue with residual mixed malignant germ cell tumor with treatment effect   J: Gallbladder, cholecystectomy - Gallbladder with mild chronic serositis and fibrous adhesion   K: Right diaphragm, excision - Fibrous tissue with residual mixed malignant germ cell tumor with treatment effect - Hepatic tissue with no tumor present   L: Left diaphragm nodule, biopsy - Fibrous tissue with residual mixed malignant germ cell tumor with treatment effect   M: Spleen, splenectomy - Residual mixed malignant germ cell tumor with treatment effect involving splenic capsule   N: Appendix, appendectomy - Appendix with fibrous obliteration at the tip - Periappendiceal adipose tissue with acute and chronic inflammation   O: Small bowel mesenteric nodules, excision - Fibroadipose tissue with residual mixed malignant germ cell tumor with treatment effect   P: Mesenteric nodules, excision - Fibroadipose tissue with residual mixed malignant germ cell tumor with treatment effect   Q: Hepatic flexure mass, excision - Fibroadipose tissue with residual mixed malignant germ cell tumor with treatment effect   R: Anvil and donut, biopsy - Benign segment of colon with acute and chronic inflammation and fibrous adhesion  SPECIMEN    Procedure:    Radical hysterectomy     Procedure:    Omentectomy     Procedure:    Peritoneal tumor debulking     Hysterectomy Type:    Abdominal     Specimen Integrity:          Right Ovary Integrity:     Capsule intact     Specimen Integrity:          Left Ovary Integrity:    Capsule  intact   TUMOR    Tumor Site:    Bilateral ovaries     Tumor Size:    Greatest Dimension (Centimeters): 19.5 cm     Histologic Type:    Mixed malignant germ cell tumor:  yolk sac tumor, mature teratoma, mature neural elements, immature neural tissue, and immature cartilage     Ovarian Surface Involvement:    Present, right and left     Other Tissue / Organ Involvement:    Right ovary     Other Tissue / Organ Involvement:    Left ovary     Other Tissue / Organ Involvement:    Right fallopian tube     Other Tissue / Organ Involvement:    Omentum     Other Tissue / Organ Involvement:    Diaphragm and splenic capsule     Largest Extrapelvic Peritoneal Focus:    Macroscopic (greater than 2 cm): Splenic capsule     Peritoneal / Ascitic Fluid Involvement:    Not submitted / unknown   REGIONAL LYMPH NODES     Regional Lymph Node Status:    Not applicable (no regional lymph nodes submitted or found)   DISTANT METASTASIS     Distant Site(s) Involved:    Diaphragm, splenic capsule   pTNM CLASSIFICATION (AJCC 8th Edition)     Reporting of pT, pN, and (when applicable) pM categories is based on information available to the pathologist at the time the report is issued. As per the AJCC (Chapter 1, 8th Ed.) it is the managing physician's responsibility to establish the final pathologic stage based upon all pertinent information, including but potentially not limited to this pathology report.     Modified Classification:    y     pT Category:    pT3c     pN Category:    pN not assigned (no nodes submitted or found)     pM Category:    pM1   FIGO STAGE     FIGO Stage:    IIIC   COMMENT:  The needle core biopsies show a heterogeneous combination of elements including abundant neural tissue with atypical and immature features. There are also atypical glandular epithelial structures and focal yolk sac elements with  microcystic pattern.  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  Immunohistochemistry is performed for cytokeratin AE1/AE3, CD117, CD30, G FAP, glypican-3, OCT3/4, synaptophysin, S100 and CD30.     03/14/2022 - 05/02/2022 Chemotherapy   Patient is on Treatment Plan : Malignant germ cell tumor of ovary D1-5 q21d     03/22/2022 Tumor Marker   Patient's tumor was tested for the following markers: AFP. Results of the tumor marker test revealed 7.9.   04/23/2022 Genetic Testing   Negative genetic testing on the CancerNext-Expanded+RNAinsight panel.  The report date is April 23, 2022.  The CancerNext-Expanded gene panel offered by [facility] and includes sequencing and rearrangement analysis for the following 77 genes: AIP, ALK, APC*, ATM*, AXIN2, BAP1, BARD1, BMPR1A, BRCA1*, BRCA2*, BRIP1*, CDC73, CDH1*, CDK4, CDKN1B, CDKN2A, CHEK2*, CTNNA1, DICER1, FH, FLCN, KIF1B, LZTR1, MAX, MEN1, MET, MLH1*, MSH2*, MSH3, MSH6*, MUTYH*, NF1*, NF2, NTHL1, PALB2*, PHOX2B, PMS2*, POT1, PRKAR1A, PTCH1, PTEN*, RAD51C*, RAD51D*, RB1, RET, SDHA, SDHAF2, SDHB, SDHC, SDHD, SMAD4, SMARCA4, SMARCB1, SMARCE1, STK11, SUFU, TMEM127, TP53*, TSC1, TSC2, and VHL (sequencing and deletion/duplication); EGFR, EGLN1, HOXB13, KIT, MITF, PDGFRA, POLD1, and POLE (sequencing only); EPCAM and GREM1 (deletion/duplication only). DNA and  RNA analyses performed for * genes.    05/17/2022 Imaging   CT ABDOMEN PELVIS W WO CONTRAST  Result Date: 05/17/2022 CLINICAL DATA:  History of ovarian cancer status post recent section and completion of chemotherapy 1-1/2 weeks ago. Evaluation of possible ileostomy reversal. EXAM: CT ABDOMEN AND PELVIS WITHOUT AND WITH CONTRAST TECHNIQUE: Multidetector CT imaging of the abdomen and pelvis was performed following the standard protocol before and following the bolus administration of intravenous contrast. RADIATION DOSE REDUCTION: This  exam was performed according to the departmental dose-optimization program which includes automated exposure control, adjustment of the mA and/or kV according to patient size and/or use of iterative reconstruction technique. CONTRAST:  OMNIPAQUE  IOHEXOL  300 MG/ML  SOLN COMPARISON:  CT abdomen and pelvis dated 02/09/2022 FINDINGS: Lower chest: No focal consolidation or pulmonary nodule in the lung bases. No pleural effusion or pneumothorax demonstrated. Partially imaged heart size is normal. Hepatobiliary: Postsurgical changes of the right hepatic dome with irregular capsular retraction. Punctate adjacent focus of subdiaphragmatic calcification (11:32). Unchanged subcentimeter hypoattenuating focus along posterior segment 7 (4:18). No intra or extrahepatic biliary ductal dilation. Cholecystectomy. Pancreas: No focal lesions or main ductal dilation. Spleen: Splenectomy. Lobulated, enhancing soft tissue density along the left hemidiaphragm at the splenectomy site measures 1.5 x 1.0 cm (4:11). Adrenals/Urinary Tract: No adrenal nodules. No suspicious renal mass or hydronephrosis. Punctate nonobstructing left lower pole stones. No focal bladder wall thickening. Stomach/Bowel: Normal appearance of the stomach. Right upper quadrant ileostomy. Enteric contrast material is present within the medial limb of the ileostomy. Appendectomy. Vascular/Lymphatic: Aortic atherosclerosis. No enlarged abdominal or pelvic lymph nodes. Reproductive: Hysterectomy and bilateral salpingo-oophorectomy. Ill-defined soft tissue near the left vaginal (4:66). No adnexal masses. Other: Status post omentectomy. Ill-defined lateral peritoneal thickening, for example series 4 image 38 on the right and image 36 on the left associated with scattered foci of mesenteric nodularity, for example 7 x 5 mm (4:34). Musculoskeletal: No acute or abnormal lytic or blastic osseous lesions. Postsurgical changes of the anterior abdominal wall. IMPRESSION: 1.  Extensive postsurgical changes of the abdomen and pelvis. Right upper quadrant ileostomy with enteric contrast material present within the medial limb of the ileostomy. No evidence of obstruction. 2. Multifocal ill-defined soft tissue densities as described, indeterminate, may reflect postsurgical change versus residual tumor. 3. Lobulated, enhancing soft tissue density along the left hemidiaphragm at the splenectomy site may represent residual/recurrent splenic tissue versus residual tumor, although the enhancement pattern does not appear consistent with pre-surgical appearance of the malignancy. 4.  Aortic Atherosclerosis (ICD10-I70.0). Electronically Signed   By: Limin  Xu M.D.   On: 05/17/2022 16:43      06/09/2022 Imaging   CT ABDOMEN PELVIS WO CONTRAST  Result Date: 06/09/2022 CLINICAL DATA:  Ovarian cancer, status post chemotherapy. Rectal contrast administered to evaluate for anastomotic patency prior to reversal. EXAM: CT ABDOMEN AND PELVIS WITHOUT CONTRAST TECHNIQUE: Multidetector CT imaging of the abdomen and pelvis was performed following the standard protocol without IV contrast. RADIATION DOSE REDUCTION: This exam was performed according to the departmental dose-optimization program which includes automated exposure control, adjustment of the mA and/or kV according to patient size and/or use of iterative reconstruction technique. COMPARISON:  05/17/2022 FINDINGS: Lower chest: Lung bases are clear. Hepatobiliary: Unenhanced liver is unremarkable. Status post cholecystectomy. No intrahepatic or extrahepatic ductal dilatation. Pancreas: Within normal limits. 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 Adrenals/Urinary Tract: Adrenal glands are within normal limits. Right kidney is within normal limits. 3 mm nonobstructing left  lower pole renal calculus (series 2/image 22). No hydronephrosis. Bladder is within normal limits. Stomach/Bowel: Stomach is within normal limits.  No evidence of bowel obstruction. 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 Status post left hemicolectomy with suture line in the lower pelvis (series 2/image 87). Rectal contrast confirms a patent anastomosis without evidence of leak. Vascular/Lymphatic: No evidence of abdominal aortic aneurysm. Atherosclerotic calcifications of the abdominal aorta and branch vessels. No suspicious abdominopelvic lymphadenopathy. Reproductive: Status post hysterectomy and suspected bilateral salpingo oophorectomy. Other: No abdominopelvic ascites. Mild peritoneal nodularity along the lateral left mid abdomen, with discrete 8 mm nodule (series 2/image 33), more conspicuous than on the prior. Mild nonspecific mesenteric stranding along the jejunal mesentery in central left mid abdomen (series 2/image 30). Mildly prominent soft tissue along the left pelvic cul-de-sac measuring up to 2.0 cm (series 2/image 59), grossly unchanged. Overall appearance suggests very mild peritoneal disease which is overall grossly unchanged. Musculoskeletal: Visualized osseous structures are within normal limits. 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 Rectal contrast confirms a patent anastomosis without evidence of leak. 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 Additional ancillary findings as above. Electronically Signed   By: Pinkie Pebbles M.D.   On: 06/09/2022 01:59   CT ABDOMEN PELVIS W WO CONTRAST  Result Date: 05/17/2022 CLINICAL DATA:  History of ovarian cancer status post recent section and completion of chemotherapy 1-1/2 weeks ago. Evaluation of possible ileostomy reversal. EXAM: CT ABDOMEN AND PELVIS WITHOUT AND WITH CONTRAST TECHNIQUE: Multidetector CT imaging of the abdomen and pelvis was performed following the standard protocol before and following the bolus administration of intravenous contrast. RADIATION DOSE REDUCTION: This exam was  performed according to the departmental dose-optimization program which includes automated exposure control, adjustment of the mA and/or kV according to patient size and/or use of iterative reconstruction technique. CONTRAST:  OMNIPAQUE  IOHEXOL  300 MG/ML  SOLN COMPARISON:  CT abdomen and pelvis dated 02/09/2022 FINDINGS: Lower chest: No focal consolidation or pulmonary nodule in the lung bases. No pleural effusion or pneumothorax demonstrated. Partially imaged heart size is normal. Hepatobiliary: Postsurgical changes of the right hepatic dome with irregular capsular retraction. Punctate adjacent focus of subdiaphragmatic calcification (11:32). Unchanged subcentimeter hypoattenuating focus along posterior segment 7 (4:18). No intra or extrahepatic biliary ductal dilation. Cholecystectomy. Pancreas: No focal lesions or main ductal dilation. Spleen: Splenectomy. Lobulated, enhancing soft tissue density along the left hemidiaphragm at the splenectomy site measures 1.5 x 1.0 cm (4:11). Adrenals/Urinary Tract: No adrenal nodules. No suspicious renal mass or hydronephrosis. Punctate nonobstructing left lower pole stones. No focal bladder wall thickening. Stomach/Bowel: Normal appearance of the stomach. Right upper quadrant ileostomy. Enteric contrast material is present within the medial limb of the ileostomy. Appendectomy. Vascular/Lymphatic: Aortic atherosclerosis. No enlarged abdominal or pelvic lymph nodes. Reproductive: Hysterectomy and bilateral salpingo-oophorectomy. Ill-defined soft tissue near the left vaginal (4:66). No adnexal masses. Other: Status post omentectomy. Ill-defined lateral peritoneal thickening, for example series 4 image 38 on the right and image 36 on the left associated with scattered foci of mesenteric nodularity, for example 7 x 5 mm (4:34). Musculoskeletal: No acute or abnormal lytic or blastic osseous lesions. Postsurgical changes of the anterior abdominal wall. IMPRESSION: 1. Extensive  postsurgical changes of the abdomen and pelvis. Right upper quadrant ileostomy with enteric contrast material present within the medial limb of the ileostomy. No evidence of obstruction. 2. Multifocal ill-defined soft tissue densities as described, indeterminate, may reflect postsurgical change versus residual tumor.  3. Lobulated, enhancing soft tissue density along the left hemidiaphragm at the splenectomy site may represent residual/recurrent splenic tissue versus residual tumor, although the enhancement pattern does not appear consistent with pre-surgical appearance of the malignancy. 4.  Aortic Atherosclerosis (ICD10-I70.0). Electronically Signed   By: Limin  Xu M.D.   On: 05/17/2022 16:43      07/01/2022 Surgery   Pre-operative Diagnosis: Ileostomy, planned reversal   Post-operative Diagnosis: same as above   Operation: Ileostomy takedown   Surgeon: Viktoria Crank MD    Operative Findings: Normal-appearing loop ileostomy with some mild retraction.  Some filmy adhesions around the loop of ileum itself and its mesentery.  Fascia easily identified and normal in appearance.  Ileum freed from surrounding anterior abdominal wall peritoneum.     08/09/2022 Tumor Marker   Patient's tumor was tested for the following markers: CA-125. Results of the tumor marker test revealed 12.6.   08/10/2022 Tumor Marker   Patient's tumor was tested for the following markers: AFP. Results of the tumor marker test revealed 3.   10/06/2022 Imaging   CT A/P: No acute abdominal or pelvic pathology.    01/19/2023 Imaging   CT A/P: 1. New subtle 6 mm hypodensity in the left lobe of the liver, nonspecific consider attention on short-term interval follow-up CT or potentially more definitive characterization by pre and postcontrast enhanced abdominal MRI. 2. No other evidence to suggest metastatic disease in the abdomen or pelvis. 3. Punctate nonobstructive left lower pole renal stones. 4. Moderate volume of  formed stool in the colon. 5.  Aortic Atherosclerosis (ICD10-I70.0).   01/20/2023 Tumor Marker   Patient's tumor was tested for the following markers: CA-125, AFP and HCG. Results of the tumor marker test revealed CA-125 11.5, AFP 4.1, HCG <1.   01/23/2023 Imaging   MRI liver: 1. No solid liver abnormality is seen. Specifically, no mass or suspicious abnormality in the central left lobe of the liver to correspond to finding of prior CT. 2. Status post splenectomy. Unchanged postoperative soft tissue in the left upper quadrant. 3. Hepatic parenchymal iron deposition. 4. Large burden of stool throughout the colon.   04/18/2023 Tumor Marker   Patient's tumor was tested for the following markers: CA-125, AFP and HCG. Results of the tumor marker test revealed CA-125 11.2, AFP 3.7 HCG <1.     07/31/2023 Imaging   CT CHEST ABDOMEN PELVIS W CONTRAST Result Date: 07/31/2023 CLINICAL DATA:  History of ovarian cancer, monitor. * Tracking Code: BO * EXAM: CT CHEST, ABDOMEN, AND PELVIS WITH CONTRAST TECHNIQUE: Multidetector CT imaging of the chest, abdomen and pelvis was performed following the standard protocol during bolus administration of intravenous contrast. RADIATION DOSE REDUCTION: This exam was performed according to the departmental dose-optimization program which includes automated exposure control, adjustment of the mA and/or kV according to patient size and/or use of iterative reconstruction technique. CONTRAST:  OMNIPAQUE  IOHEXOL  300 MG/ML  SOLN COMPARISON:  Multiple priors including CT April 17, 2023 FINDINGS: CT CHEST FINDINGS Cardiovascular: Accessed right chest Port-A-Cath with tip in the SVC. Normal caliber thoracic aorta. Normal size heart. No significant pericardial effusion/thickening. Mediastinum/Nodes: No suspicious thyroid  nodule. Similar feathery crescentic soft tissue in the anterior mediastinum which conforms underlying vasculature and is favored thymic tissue. No pathologically  enlarged mediastinal, hilar or axillary lymph nodes. Patulous esophagus. Lungs/Pleura: Stable triangular 2 mm pulmonary nodule along the left major fissure on image 83/4. No new suspicious pulmonary nodules or masses. Musculoskeletal: No aggressive lytic or blastic lesion of bone.  CT ABDOMEN PELVIS FINDINGS Hepatobiliary: Stable small cyst in the left hepatic dome and technically too small to accurately characterize hypodense lesion in the inferior right lobe of the liver on image 67/2. No new suspicious hepatic lesion. Gallbladder surgically absent. Similar mild prominence of the biliary tree. Pancreas: No pancreatic ductal dilation or evidence of acute inflammation. Spleen: Surgically absent. Similar soft tissue in the left upper quadrant favored a small splenule. Adrenals/Urinary Tract: No suspicious adrenal nodule/mass. No hydronephrosis. Kidneys demonstrate symmetric enhancement. Urinary bladder is unremarkable for degree of distension. Stomach/Bowel: Stomach is unremarkable for degree of distension. No pathologic dilation of small or large bowel. Colonic stool burden compatible with constipation. Rectosigmoid anastomotic sutures. Vascular/Lymphatic: Normal caliber abdominal aorta. Smooth IVC contours. The portal, splenic and superior mesenteric veins are patent. No pathologically enlarged abdominal or pelvic lymph nodes. Reproductive: Similar size of the asymmetric soft tissue nodularity along the left vaginal cuff measuring 3.8 x 2.6 cm on image 106/2 previously 3.8 x 2.5 cm when remeasured in similar fashion and on CT January 19 2023 measuring 3.5 x 2.5 cm again when remeasured for similar technique and 3.6 x 2.5 cm on CT October 06, 2022. Other: No significant abdominopelvic free fluid. Musculoskeletal: No aggressive lytic or blastic lesion of bone. IMPRESSION: 1. Similar size of the asymmetric soft tissue nodularity along the left vaginal cuff, but the soft tissue prominence within the measurement appears  subtly more prominent over consecutive examinations, suggest more definitive assessment by pelvic MRI. 2. Stable triangular 2 mm pulmonary nodule along the left major fissure, favored to reflect a benign intrapulmonary lymph node. 3. Colonic stool burden compatible with constipation. Electronically Signed   By: Reyes Holder M.D.   On: 07/31/2023 14:36      08/01/2023 Tumor Marker   Patient's tumor was tested for the following markers: CA-125, AFP and HCG. Results of the tumor marker test revealed CA-125 is 12.8.   10/05/2023 Tumor Marker   Patient's tumor was tested for the following markers: CA-125, AFP and HCG. Results of the tumor marker test revealed CA-125 is 12.7, HCG 2, AFP 3.5   12/04/2023 Imaging   CT CHEST ABDOMEN PELVIS W CONTRAST Result Date: 12/05/2023 CLINICAL DATA:  Genital cancer, monitor. Malignant germ cell tumor of ovary. * Tracking Code: BO * EXAM: CT CHEST, ABDOMEN, AND PELVIS WITH CONTRAST TECHNIQUE: Multidetector CT imaging of the chest, abdomen and pelvis was performed following the standard protocol during bolus administration of intravenous contrast. RADIATION DOSE REDUCTION: This exam was performed according to the departmental dose-optimization program which includes automated exposure control, adjustment of the mA and/or kV according to patient size and/or use of iterative reconstruction technique. CONTRAST:  100mL OMNIPAQUE  IOHEXOL  300 MG/ML  SOLN COMPARISON:  None Available. FINDINGS: CT CHEST FINDINGS Cardiovascular: Accessed right chest Port-A-Cath with tip near the superior cavoatrial junction. Normal caliber thoracic aorta. Normal size heart. No significant pericardial effusion/thickening. Mediastinum/Nodes: No suspicious thyroid  nodule. No pathologically enlarged mediastinal, hilar or axillary lymph nodes. The esophagus is grossly unremarkable. Lungs/Pleura: No suspicious pulmonary nodules or masses. Stable 2 mm triangular nodule along the left major fissure on  image 82/7. Biapical pleuroparenchymal scarring. Musculoskeletal: No suspicious osseous lesion. CT ABDOMEN PELVIS FINDINGS Hepatobiliary: Stable too small to accurately characterize hepatic lesions, not hypermetabolic on prior PET-CT and favored benign. Gallbladder surgically absent. Similar mild prominence of the biliary tree. Pancreas: No pancreatic ductal dilation or evidence of acute inflammation. Spleen: Spleen surgically absent. Adrenals/Urinary Tract: No suspicious adrenal nodule/mass. Punctate nonobstructive left lower  pole renal stone. No hydronephrosis. Urinary bladder is unremarkable for degree of distension. Stomach/Bowel: Radiopaque enteric contrast material traverses the ascending colon. Stomach is unremarkable for degree of distension. No pathologic dilation of small or large bowel. Colonic stool burden suggestive of constipation. Prior partial distal colonic resection with rectosigmoid anastomosis. Vascular/Lymphatic: Scattered aortic atherosclerosis. Normal caliber abdominal aorta. Smooth IVC contours. The portal, splenic and superior mesenteric veins are patent. No pathologically enlarged abdominal or pelvic lymph nodes. Reproductive: Uterus is surgically absent. Irregular soft tissue along the left posterior aspect of the vaginal cuff measures 2.9 x 2.2 cm on image 108/2 previously 3.8 x 2.6 cm and without significant abnormal FDG avidity on prior PET-CT. Other: No significant abdominopelvic free fluid. Musculoskeletal: No aggressive lytic or blastic lesion of bone. IMPRESSION: 1. Prior hysterectomy with decreased size of the irregular soft tissue along the left posterior aspect of the vaginal cuff, without significant abnormal FDG avidity on prior PET-CT. Constellation of findings between imaging examinations is favored postsurgical change but warranting continued attention on follow-up imaging. 2. No evidence of metastatic disease within the chest, abdomen or pelvis. 3. Punctate nonobstructive  left lower pole renal stone. 4. Colonic stool burden suggestive of constipation. 5.  Aortic Atherosclerosis (ICD10-I70.0). Electronically Signed   By: Reyes Holder M.D.   On: 12/05/2023 06:45        Interval History: She presents to the office for continued follow up. She reports overall doing well since her last visit. Her new job is going well. She continues to have anxiety related to lab and imaging testing, coming to the cancer center for visits. She feels zoloft  is healing with her symptoms. Tolerating diet with no appetite changes. Reports weight gain related to zoloft . Denies any abdominal or pelvic pain. No early satiety or distension. Reports baseline bowel and bladder function.  Denies any vaginal bleeding or abnormal discharge. No symptoms concerning for recurrence voiced.  Past Medical/Surgical History: Past Medical History:  Diagnosis Date   Arthritis    Endometriosis    Family history of breast cancer    Family history of breast cancer in mother    at age 64   GAD (generalized anxiety disorder)    lexapro helped 2017 (Dr. Adel did have extra stress of the Master's degree program on her at that time.  Got counseling.       GERD (gastroesophageal reflux disease)    Gestational diabetes    History of kidney stones    Low back pain    Dr. Zach Smith.   Neck pain    and left shoulder pain: managed by osteopathic manipulation by Dr. Darlyn Sharps. ?cervical radiculopathy   ovarian ca 12/01/2021   PAC (premature atrial contraction)    PONV (postoperative nausea and vomiting)     Past Surgical History:  Procedure Laterality Date   ABDOMINAL EXPLORATION SURGERY  2009   fulguration of endometriosis - [facility] surgery   DEBULKING  02/15/2022   revoved uterus and ovaries, omentun, gallbladder appendix, spleen  and 6 inches of colon   ILEOSTOMY  02/15/2022   ILEOSTOMY CLOSURE N/A 06/22/2022   Procedure: ILEOSTOMY TAKEDOWN;  Surgeon: Viktoria Comer SAUNDERS, MD;  Location: [facility];  Service: General;  Laterality: N/A;   IR IMAGING GUIDED PORT INSERTION  12/08/2021   IR US  GUIDE BX ASP/DRAIN  12/08/2021   SALPINGECTOMY Right 2009   TONSILLECTOMY     TUBAL LIGATION Left 2009    Family History  Problem Relation Age of Onset  Rheum arthritis Mother    Stroke Mother    Hypertension Mother    Heart disease Mother    Arthritis Mother    Breast cancer Mother 22   Heart attack Mother 14   Diabetes Father    Heart disease Father    Hyperlipidemia Father    Hypertension Father    Heart attack Father 82   Non-Hodgkin's lymphoma Paternal Uncle 54   Heart disease Maternal Grandmother    Heart attack Maternal Grandmother 36   Arthritis Maternal Grandmother    Early death Maternal Grandfather 30       MVA   Diabetes Paternal Grandmother    Hypertension Paternal Grandmother    Heart disease Paternal Grandmother    Heart attack Paternal Grandmother 24   Heart disease Paternal Grandfather    Heart attack Paternal Grandfather 9   Coronary artery disease Other    Glaucoma Other     Social History   Socioeconomic History   Marital status: Married    Spouse name: Not on file   Number of children: 3   Years of education: Not on file   Highest education level: Not on file  Occupational History   Occupation: Academic Librarian: [employer]    Comment: at [employer]   Tobacco Use   Smoking status: Never   Smokeless tobacco: Never  Vaping Use   Vaping status: Never Used  Substance and Sexual Activity   Alcohol use: Not Currently    Comment: Occasionally   Drug use: No   Sexual activity: Yes    Partners: Male    Birth control/protection: Surgical  Other Topics Concern   Not on file  Social History Narrative   Married, 3 kids.    Lives in [place].   Educ: Masters deg nursing.   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   No tob/drugs.   Alc: social.   Social Drivers of  Health   Financial Resource Strain: Low Risk (02/17/2022)   Received from [facility]   Overall Financial Resource Strain (CARDIA)    Difficulty of Paying Living Expenses: Not very hard  Food Insecurity: No Food Insecurity (06/22/2022)   Hunger Vital Sign    Worried About Running Out of Food in the Last Year: Never true    Ran Out of Food in the Last Year: Never true  Transportation Needs: No Transportation Needs (06/22/2022)   PRAPARE - Administrator, Civil Service (Medical): No    Lack of Transportation (Non-Medical): No  Physical Activity: Insufficiently Active (11/18/2021)   Received from [facility]   Exercise Vital Sign    On average, how many days per week do you engage in moderate to strenuous exercise (like a brisk walk)?: 2 days    On average, how many minutes do you engage in exercise at this level?: 40 min  Stress: No Stress Concern Present (11/18/2021)   Received from [facility] - Occupational Stress Questionnaire    Feeling of Stress : Only a little  Social Connections: Unknown (11/22/2022)   Received from [facility]   Social Network    Social Network: Not on file    Current Medications:  Current Outpatient Medications:    acetaminophen  (TYLENOL ) 650 MG CR tablet, Take 650 mg by mouth every 8 (eight) hours as needed for pain., Disp: ,  Rfl:    Cholecalciferol (VITAMIN D ) 50 MCG (2000 UT) tablet, Take 2,000 Units by mouth daily., Disp: , Rfl:    cyanocobalamin  (VITAMIN B12) 1000 MCG tablet, Take 1,000 mcg by mouth daily., Disp: , Rfl:    diphenhydrAMINE  (BENADRYL ) 25 MG tablet, Take 25 mg by mouth every 6 (six) hours as needed for allergies., Disp: , Rfl:    docusate sodium  (COLACE) 50 MG capsule, Take 50 mg by mouth 2 (two) times daily. (Patient taking differently: Take 50 mg by mouth daily as needed for mild constipation.), Disp: , Rfl:    estradiol  (VIVELLE -DOT) 0.05 MG/24HR patch, Place 1 patch (0.05 mg  total) onto the skin 2 (two) times a week., Disp: 8 patch, Rfl: 12   famotidine  (PEPCID ) 10 MG tablet, Take 10 mg by mouth daily as needed for heartburn or indigestion., Disp: , Rfl:    hydrOXYzine  (ATARAX ) 10 MG tablet, Take 1 tablet (10 mg total) by mouth 3 (three) times daily as needed., Disp: 90 tablet, Rfl: 0   loratadine (CLARITIN) 10 MG tablet, Take by mouth., Disp: , Rfl:    LORazepam  (ATIVAN ) 0.5 MG tablet, Take 1 tablet (0.5 mg total) by mouth every 8 (eight) hours as needed for anxiety., Disp: 60 tablet, Rfl: 0   Magnesium  Glycinate 100 MG CAPS, Take 200 mg by mouth daily., Disp: , Rfl:    sertraline  (ZOLOFT ) 25 MG tablet, Take 1 tablet (25 mg total) by mouth daily., Disp: 30 tablet, Rfl: 2  Review of Systems: No new symptoms on intake ROS form reported. Denies appetite changes, fevers, chills, fatigue, unexplained weight changes. Denies hearing loss, neck lumps or masses, mouth sores, ringing in ears or voice changes. Denies cough or wheezing.  Denies shortness of breath. Denies chest pain or palpitations. Denies leg swelling. Denies abdominal distention, pain, blood in stools, constipation, diarrhea, nausea, vomiting, or early satiety. Denies pain with intercourse, dysuria, frequency, hematuria or incontinence. Denies hot flashes, pelvic pain, vaginal bleeding or vaginal discharge.   Denies joint pain, back pain or muscle pain/cramps. Denies itching, rash, or wounds. Denies dizziness, headaches, numbness or seizures. Denies swollen lymph nodes or glands, denies easy bruising or bleeding. Denies anxiety, depression, confusion, or decreased concentration.  Physical Exam: BP 138/78 Comment: manual recheck, NP aware  Pulse 100 Comment: manual recheck, NP aware  Temp 98.4 F (36.9 C) (Oral)   Resp 18   Ht 4' 11 (1.499 m)   Wt 129 lb (58.5 kg)   LMP  (LMP Unknown) Comment: tubal ligation  BMI 26.05 kg/m  General: Alert, oriented, no acute distress. HEENT: Normocephalic,  atraumatic, sclera anicteric. Chest: Clear to auscultation bilaterally.  No wheezes or rhonchi. Cardiovascular: Tachycardic. Regular rhythm. No murmurs. Abdomen: soft, nontender.  Normoactive bowel sounds.  No masses or hepatosplenomegaly appreciated.  Well-healed incisions. Extremities: Grossly normal range of motion.  Warm, well perfused.  No edema bilaterally. Skin: No rashes or lesions noted. Lymphatics: No cervical, supraclavicular, or inguinal adenopathy. GU: Normal appearing external genitalia without erythema, excoriation, or lesions.  Speculum exam reveals mildly atrophic vaginal mucosa, no lesions noted.  Bimanual exam reveals smooth, no masses or nodularity.  There is minimal thickening at the vaginal cuff on the right appreciated only on rectovaginal exam.  No nodularity or firmness here.  Laboratory & Radiologic Studies:          Component Ref Range & Units (hover) 11 d ago (10/02/23) 2 mo ago (07/31/23) 5 mo ago (04/17/23) 8 mo ago (01/19/23) 12 mo ago (  10/14/22) 1 yr ago (08/08/22) 1 yr ago (06/07/22)  Cancer Antigen (CA) 125 12.7 12.8 CM 11.2 CM 11.7 CM 10.2 CM 12.6 CM 17.5               Component Ref Range & Units (hover) 11 d ago (10/02/23) 2 mo ago (08/04/23) 5 mo ago (04/17/23) 8 mo ago (01/19/23) 1 yr ago (08/08/22) 1 yr ago (06/07/22) 1 yr ago (03/21/22)  AFP, Serum, Tumor Marker 3.5 3.6 CM 3.7 CM 4.1 CM 3.0 R, CM 3.2 R, CM 7.9 High  R, CM              Component Ref Range & Units (hover) 11 d ago (10/02/23) 2 mo ago (08/04/23) 5 mo ago (04/17/23) 8 mo ago (01/19/23) 12 mo ago (10/14/22) 1 yr ago (08/08/22) 1 yr ago (02/10/22)  hCG Quant 2 2 CM 1 CM 1 CM <1 CM <1 CM <1 CM     Assessment & Plan: [name] is a 53 y.o. 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 She has completed 3 cycles of adjuvant chemotherapy with etoposide  and cisplatin .  She is s/p ileostomy reversal on 06/22/2022.  CT imaging in 07/2023 with slightly more prominent asymmetric soft tissue nodularity along the left vaginal cuff.  On MRI, this complex cystic and solid lesion measured 3.7 cm, thought to be slightly increased in size compared to CT scans dating back to 09/2022.  Minimal uptake of this area on PET scan.  At that time, Dr. Viktoria discussed option of close imaging surveillance versus attempt at biopsy with plan for close imaging follow-up. Repeat imaging was performed on 12/04/2023 with no evidence of metastatic disease and decrease in size of tissue at left aspect of vaginal cuff suggesting post-op changes.   The patient is overall doing very well.  No concerning findings on examination today.    Tumor markers, CA-125, AFP and HCG, obtained today. She will be contacted with the results.   Continue estrogen replacement therapy for menopausal symptom management.   Given her anxiety with regard to testing and appointments at the [facility], Dr. Viktoria discussed at her last visit trying to get her on a schedule where she comes every 3 months with plans to get her exam/visits, tumor markers, and CT imaging all around the same time every 3 months. Follow up has been arranged for March with Dr. Viktoria. Will discuss timing of CT scan with Dr. Viktoria and patient will be contacted. Reportable signs and symptoms reviewed. She is advised to call for any needs or concerns. She will continue with port flushes as well. Tachycardia improving at end of visit related to anxiety per pt.   20 minutes of total time was spent for this patient encounter, including preparation, face-to-face counseling with the patient and coordination of care, and documentation of the encounter.  Eleanor Epps NP [facility]

## 2024-01-16 NOTE — Patient Instructions (Signed)
 It was good to see you today.   No concerning findings on today's examination.   We will contact you with the results of your lab work from today.  Plan to follow up with Dr. Viktoria in three months or sooner if needed.   Symptoms to report to your health care team include vaginal bleeding, rectal bleeding, bloating, weight loss without effort, new and persistent pain, new and  persistent fatigue, new leg swelling, new masses (i.e., bumps in your neck or groin), new and persistent cough, new and persistent nausea and vomiting, change in bowel or bladder habits, and any other concerns.   Happy Holidays to you and your family.

## 2024-01-17 LAB — CA 125: Cancer Antigen (CA) 125: 11.5 U/mL (ref 0.0–38.1)

## 2024-01-17 LAB — BETA HCG QUANT (REF LAB): hCG Quant: 1 m[IU]/mL

## 2024-01-17 LAB — AFP TUMOR MARKER: AFP, Serum, Tumor Marker: 3.6 ng/mL (ref 0.0–9.2)

## 2024-01-18 ENCOUNTER — Other Ambulatory Visit: Payer: Self-pay | Admitting: Family Medicine

## 2024-01-18 ENCOUNTER — Other Ambulatory Visit (HOSPITAL_BASED_OUTPATIENT_CLINIC_OR_DEPARTMENT_OTHER): Payer: Self-pay

## 2024-01-18 ENCOUNTER — Ambulatory Visit: Payer: Self-pay | Admitting: Gynecologic Oncology

## 2024-01-18 ENCOUNTER — Other Ambulatory Visit: Payer: Self-pay | Admitting: Hematology and Oncology

## 2024-01-18 ENCOUNTER — Other Ambulatory Visit: Payer: Self-pay

## 2024-01-18 ENCOUNTER — Encounter: Payer: Self-pay | Admitting: Gynecologic Oncology

## 2024-01-18 ENCOUNTER — Telehealth: Payer: Self-pay | Admitting: *Deleted

## 2024-01-18 ENCOUNTER — Other Ambulatory Visit: Payer: Self-pay | Admitting: Gynecologic Oncology

## 2024-01-18 DIAGNOSIS — C569 Malignant neoplasm of unspecified ovary: Secondary | ICD-10-CM

## 2024-01-18 DIAGNOSIS — N898 Other specified noninflammatory disorders of vagina: Secondary | ICD-10-CM

## 2024-01-18 MED ORDER — SERTRALINE HCL 25 MG PO TABS
25.0000 mg | ORAL_TABLET | Freq: Every day | ORAL | 2 refills | Status: AC
  Filled 2024-01-18: qty 30, 30d supply, fill #0
  Filled 2024-02-05 – 2024-02-19 (×2): qty 30, 30d supply, fill #1

## 2024-01-18 NOTE — Telephone Encounter (Signed)
 Per Dr Viktoria scheduled the patient for a CT on 2/10 at [facility] and a follow up appt on 2/13 at 3:30 pm. Patient given arrive time of 8 am on 2/10 after her 7:45 am port flush at [facility]

## 2024-02-05 ENCOUNTER — Other Ambulatory Visit (HOSPITAL_BASED_OUTPATIENT_CLINIC_OR_DEPARTMENT_OTHER): Payer: Self-pay

## 2024-02-05 ENCOUNTER — Other Ambulatory Visit: Payer: Self-pay

## 2024-02-20 ENCOUNTER — Encounter: Payer: Self-pay | Admitting: Hematology and Oncology

## 2024-02-27 ENCOUNTER — Inpatient Hospital Stay: Attending: Gynecologic Oncology

## 2024-03-01 ENCOUNTER — Other Ambulatory Visit (HOSPITAL_BASED_OUTPATIENT_CLINIC_OR_DEPARTMENT_OTHER): Payer: Self-pay

## 2024-03-04 ENCOUNTER — Other Ambulatory Visit (HOSPITAL_BASED_OUTPATIENT_CLINIC_OR_DEPARTMENT_OTHER): Payer: Self-pay

## 2024-03-06 NOTE — Progress Notes (Unsigned)
 " [name] [facility] [address] [place] [address] Phone: [phone] Subjective:   [name], am serving as a scribe for Dr. Arthea Palmer.  I'm seeing this patient by the request  of:  Patient, No Pcp Per  CC: Back and neck pain follow-up  [name]  [name] is a 54 y.o. female coming in with complaint of back and neck pain. OMT on 12/18/2023. Patient states that she has been good since last visit.   Medications patient has been prescribed:   Taking:     Patient is scheduled for a CT scan in February 10.  Tumor markers at the beginning of December continue to be stable  Reviewed prior external information including notes and imaging from previsou exam, outside providers and external EMR if available.   As well as notes that were available from care everywhere and other healthcare systems.  Past medical history, social, surgical and family history all reviewed in electronic medical record.  No pertanent information unless stated regarding to the chief complaint.   Past Medical History:  Diagnosis Date   Arthritis    Endometriosis    Family history of breast cancer    Family history of breast cancer in mother    at age 42   GAD (generalized anxiety disorder)    lexapro helped 2017 (Dr. Adel did have extra stress of the Master's degree program on her at that time.  Got counseling.       GERD (gastroesophageal reflux disease)    Gestational diabetes    History of kidney stones    Low back pain    Dr. Zach Mikeria Valin.   Neck pain    and left shoulder pain: managed by osteopathic manipulation by Dr. Darlyn Palmer. ?cervical radiculopathy   ovarian ca 12/01/2021   PAC (premature atrial contraction)    PONV (postoperative nausea and vomiting)     Allergies[1]   Review of Systems:  No headache, visual changes, nausea, vomiting, diarrhea, constipation, dizziness, abdominal pain, skin rash, fevers, chills, night sweats,  weight loss, swollen lymph nodes, body aches, joint swelling, chest pain, shortness of breath, mood changes. POSITIVE muscle aches  Objective  Blood pressure 138/80, pulse 97, height 4' 11 (1.499 m), weight 129 lb (58.5 kg), SpO2 98%.   General: No apparent distress alert and oriented x3 mood and affect normal, dressed appropriately.  HEENT: Pupils equal, extraocular movements intact  Respiratory: Patient's speak in full sentences and does not appear short of breath  Cardiovascular: No lower extremity edema, non tender, no erythema  Gait relatively normal MSK:  Back significant tightness in the lower back noted today.  Tightness with FABER as well noted.  Negative straight leg test noted.  Osteopathic findings  C2 flexed rotated and side bent right C7 flexed rotated and side bent left T3 extended rotated and side bent right inhaled rib T9 extended rotated and side bent left L2 flexed rotated and side bent right L3 flexed rotated and side bent left Sacrum right on right       Assessment and Plan:  Low back pain Chronic tightness but seems to have worsening.  Responded extremely well though to osteopathic manipulation with significant improvement in range of motion.  No change in medication today.  Follow-up again in 6 to 8 weeks    Nonallopathic problems  Decision today to treat with OMT was based on Physical Exam  After verbal consent patient was treated with HVLA, ME, FPR techniques  in cervical, rib, thoracic, lumbar, and sacral  areas  Patient tolerated the procedure well with improvement in symptoms  Patient given exercises, stretches and lifestyle modifications  See medications in patient instructions if given  Patient will follow up in 4-8 weeks     The above documentation has been reviewed and is accurate and complete Arthea CHRISTELLA Sharps, DO         Note: This dictation was prepared with Dragon dictation along with smaller phrase technology. Any  transcriptional errors that result from this process are unintentional.            [1]  Allergies Allergen Reactions   Latex Other (See Comments)    Sensitivity only   Sulfa Antibiotics Hives and Other (See Comments)   Sulfamethoxazole-Trimethoprim Rash   Septra [Bactrim] Rash   "

## 2024-03-08 ENCOUNTER — Encounter: Payer: Self-pay | Admitting: Family Medicine

## 2024-03-08 ENCOUNTER — Ambulatory Visit: Admitting: Family Medicine

## 2024-03-08 VITALS — BP 138/80 | HR 97 | Ht 59.0 in | Wt 129.0 lb

## 2024-03-08 DIAGNOSIS — M9908 Segmental and somatic dysfunction of rib cage: Secondary | ICD-10-CM

## 2024-03-08 DIAGNOSIS — M9901 Segmental and somatic dysfunction of cervical region: Secondary | ICD-10-CM

## 2024-03-08 DIAGNOSIS — G8929 Other chronic pain: Secondary | ICD-10-CM

## 2024-03-08 DIAGNOSIS — M5441 Lumbago with sciatica, right side: Secondary | ICD-10-CM | POA: Diagnosis not present

## 2024-03-08 DIAGNOSIS — M9903 Segmental and somatic dysfunction of lumbar region: Secondary | ICD-10-CM

## 2024-03-08 DIAGNOSIS — M9904 Segmental and somatic dysfunction of sacral region: Secondary | ICD-10-CM | POA: Diagnosis not present

## 2024-03-08 DIAGNOSIS — M9902 Segmental and somatic dysfunction of thoracic region: Secondary | ICD-10-CM | POA: Diagnosis not present

## 2024-03-08 NOTE — Assessment & Plan Note (Signed)
 Chronic tightness but seems to have worsening.  Responded extremely well though to osteopathic manipulation with significant improvement in range of motion.  No change in medication today.  Follow-up again in 6 to 8 weeks

## 2024-03-14 ENCOUNTER — Other Ambulatory Visit: Payer: Self-pay

## 2024-03-14 ENCOUNTER — Encounter: Payer: Self-pay | Admitting: Family Medicine

## 2024-03-14 ENCOUNTER — Other Ambulatory Visit (HOSPITAL_BASED_OUTPATIENT_CLINIC_OR_DEPARTMENT_OTHER): Payer: Self-pay

## 2024-03-14 MED ORDER — TIZANIDINE HCL 4 MG PO TABS
4.0000 mg | ORAL_TABLET | Freq: Every day | ORAL | 0 refills | Status: AC
  Filled 2024-03-14: qty 30, 30d supply, fill #0

## 2024-03-18 ENCOUNTER — Encounter: Payer: Self-pay | Admitting: Hematology and Oncology

## 2024-03-26 ENCOUNTER — Ambulatory Visit (HOSPITAL_COMMUNITY)

## 2024-03-26 ENCOUNTER — Inpatient Hospital Stay: Attending: Gynecologic Oncology

## 2024-03-29 ENCOUNTER — Inpatient Hospital Stay: Admitting: Gynecologic Oncology

## 2024-04-19 ENCOUNTER — Ambulatory Visit: Admitting: Internal Medicine

## 2024-05-10 ENCOUNTER — Inpatient Hospital Stay: Admitting: Gynecologic Oncology
